# Patient Record
Sex: Male | Born: 1945 | Race: White | Hispanic: No | Marital: Married | Smoking: Former smoker
Health system: Southern US, Community
[De-identification: ages and names within clinical notes are randomized; demographics above are authoritative.]

## PROBLEM LIST (undated history)

## (undated) DIAGNOSIS — Z9109 Other allergy status, other than to drugs and biological substances: Secondary | ICD-10-CM

## (undated) DIAGNOSIS — K5900 Constipation, unspecified: Secondary | ICD-10-CM

## (undated) DIAGNOSIS — I739 Peripheral vascular disease, unspecified: Secondary | ICD-10-CM

## (undated) DIAGNOSIS — I10 Essential (primary) hypertension: Secondary | ICD-10-CM

## (undated) DIAGNOSIS — G20 Parkinson's disease: Secondary | ICD-10-CM

## (undated) DIAGNOSIS — K4090 Unilateral inguinal hernia, without obstruction or gangrene, not specified as recurrent: Secondary | ICD-10-CM

## (undated) DIAGNOSIS — E611 Iron deficiency: Secondary | ICD-10-CM

## (undated) DIAGNOSIS — E119 Type 2 diabetes mellitus without complications: Secondary | ICD-10-CM

## (undated) DIAGNOSIS — E785 Hyperlipidemia, unspecified: Secondary | ICD-10-CM

## (undated) DIAGNOSIS — G20A1 Parkinson's disease without dyskinesia, without mention of fluctuations: Secondary | ICD-10-CM

## (undated) DIAGNOSIS — M1990 Unspecified osteoarthritis, unspecified site: Secondary | ICD-10-CM

## (undated) DIAGNOSIS — I2510 Atherosclerotic heart disease of native coronary artery without angina pectoris: Secondary | ICD-10-CM

## (undated) DIAGNOSIS — R351 Nocturia: Secondary | ICD-10-CM

## (undated) HISTORY — DX: Hyperlipidemia, unspecified: E78.5

## (undated) HISTORY — DX: Type 2 diabetes mellitus without complications: E11.9

## (undated) HISTORY — PX: BACK SURGERY: SHX140

## (undated) HISTORY — DX: Peripheral vascular disease, unspecified: I73.9

## (undated) HISTORY — DX: Essential (primary) hypertension: I10

---

## 2003-12-29 ENCOUNTER — Ambulatory Visit (HOSPITAL_COMMUNITY): Admission: AD | Admit: 2003-12-29 | Discharge: 2003-12-31 | Payer: Self-pay | Admitting: Neurosurgery

## 2005-04-12 ENCOUNTER — Ambulatory Visit (HOSPITAL_COMMUNITY): Admission: RE | Admit: 2005-04-12 | Discharge: 2005-04-12 | Payer: Self-pay | Admitting: *Deleted

## 2006-05-29 ENCOUNTER — Ambulatory Visit (HOSPITAL_COMMUNITY): Admission: RE | Admit: 2006-05-29 | Discharge: 2006-05-29 | Payer: Self-pay | Admitting: Cardiology

## 2006-05-29 HISTORY — PX: CARDIAC CATHETERIZATION: SHX172

## 2006-08-02 ENCOUNTER — Ambulatory Visit (HOSPITAL_COMMUNITY): Admission: RE | Admit: 2006-08-02 | Discharge: 2006-08-02 | Payer: Self-pay | Admitting: Internal Medicine

## 2007-12-25 ENCOUNTER — Ambulatory Visit: Payer: Self-pay | Admitting: Vascular Surgery

## 2009-01-24 ENCOUNTER — Ambulatory Visit: Payer: Self-pay | Admitting: Vascular Surgery

## 2009-11-10 ENCOUNTER — Ambulatory Visit: Payer: Self-pay | Admitting: Vascular Surgery

## 2010-03-30 ENCOUNTER — Ambulatory Visit: Payer: Self-pay | Admitting: Vascular Surgery

## 2010-08-14 NOTE — Procedures (Signed)
DUPLEX DEEP VENOUS EXAM - LOWER EXTREMITY   INDICATION:  Right leg pain and swelling.   HISTORY:  Edema:  Right.  Trauma/Surgery:  No.  Pain:  Right.  PE:  No.  Previous DVT:  No.  Anticoagulants:  Aspirin.  Other:   DUPLEX EXAM:                CFV   SFV   PopV  PTV    GSV                R  L  R  L  R  L  R   L  R  L  Thrombosis    0  0  0     0     0      0  Spontaneous   +  +  +     +     +      +  Phasic        +  +  +     +     +      +  Augmentation  +  +  +     +     +      +  Compressible  +  +  +     +     +      +  Competent     +  +  +     +     +      +   Legend:  + - yes  o - no  p - partial  D - decreased   IMPRESSION:  1. The right leg and left SFJ (saphenofemoral junction) and common      femoral vein appear to be negative for deep venous thrombosis.  2. Preliminary called to Dr. Carolee Rota office, spoke with Harriett Sine.    _____________________________  Larina Earthly, M.D.   NT/MEDQ  D:  11/10/2009  T:  11/10/2009  Job:  [id number]

## 2010-08-17 NOTE — Op Note (Signed)
NAME:  Rodney Love, Rodney Love NO.:  [id number]   MEDICAL RECORD NO.:  [id number]          PATIENT TYPE:  AMB   LOCATION:  ENDO                         FACILITY:  [facility]   PHYSICIAN:  Georgiana Spinner, M.D.    DATE OF BIRTH:  January 22, 1946   DATE OF PROCEDURE:  DATE OF DISCHARGE:                                 OPERATIVE REPORT   PROCEDURE:  Colonoscopy.   INDICATIONS:  Rectal bleeding.   ANESTHESIA:  Demerol 60, Versed 7 mg.   PROCEDURE:  With the patient mildly sedated in the left lateral decubitus  position, the Olympus videoscopic colonoscope was inserted into the rectum  after a normal rectal exam and passed under direct vision to the cecum,  identified by the ileocecal valve and the appendiceal orifice, both of which  were photographed.  From this point, the colonoscope was slowly withdrawn,  taking circumferential views of the colonic mucosa, stopping only in the  rectum, which appeared normal on direct and showed hemorrhoids on  retroflexed view.  The endoscope was straightened and withdrawn.  Patient's  vital signs and pulse oximetry remained stable.  Patient tolerated the  procedure well without apparent complications.   FINDINGS:  Internal hemorrhoids, otherwise unremarkable colonoscopic  examination to the cecum.   PLAN:  Repeat examination possibly in five years.           ______________________________  Georgiana Spinner, M.D.     GMO/MEDQ  D:  04/12/2005  T:  04/12/2005  Job:  [id number]

## 2010-08-17 NOTE — Cardiovascular Report (Signed)
NAME:  Rodney Love, Rodney Love NO.:  [id number]   MEDICAL RECORD NO.:  [id number]          PATIENT TYPE:  OIB   LOCATION:  2899                         FACILITY:  [facility]   PHYSICIAN:  Antionette Char, MD    DATE OF BIRTH:  [birth year]   DATE OF PROCEDURE:  05/29/2006  DATE OF DISCHARGE:                            CARDIAC CATHETERIZATION   PROCEDURES.:  1. Left heart catheterization.  2. Coronary cineangiography.  3. Left ventricular cineangiography.  4. Abdominal aortogram with bilateral femoral runoff.  5. Angio-Seal of the right and left femoral arteries.   INDICATION FOR PROCEDURES:  This 65 year old male has a history of  peripheral vascular disease and is at high risk for cardiovascular  disease with a family history of ischemic heart disease, hypertension  and hypercholesterolemia. He had a significant change on his resting  electrocardiogram when he had a routine follow-up with Dr. Renne Crigler. He was  then sent for a treadmill exercise tolerance test which was positive and  showed moderate to high risk by [facility] criteria.  He was then scheduled  for a cardiac catheterization.   PROCEDURE:  After signing an informed consent the patient was  premedicated with 5 mg of Valium by mouth and brought to the cardiac  catheterization lab at [facility]  His right groin was prepped  and draped in sterile fashion and anesthetized locally with 1%  lidocaine.  A 6-French introducer sheath was inserted percutaneously  into the right femoral artery.  A good pressure was obtained in the  right femoral artery.  However, we were unable to pass the Seldinger  wire through the common iliac artery. We then attempted to cross this  segment with a Wholey Hi-Torque J-wire and again we were unable to pass  through this section.  His left groin was then prepped and draped in a  sterile fashion and anesthetized local with 1% lidocaine. 6-French  introducer sheath was inserted  percutaneously into the left femoral  artery. This time a sterile routine Seldinger wire was easily passed  through the femoral and iliac arteries and easily passed to the  ascending aorta. Injections were made into both coronary arteries using  6-French #4 Judkins coronary catheters.  A 6-French pigtail catheter was  used to measure pressures in the left ventricle and aorta and to make  midstream injections into the left ventricle, mid abdominal aorta and  distal abdominal aorta. The patient tolerated the procedure well and no  complications were noted at the end of the procedure. The catheter and  sheaths were removed from the left femoral artery and hemostasis was  easily obtained with an Angio-Seal closure system. The sheath was then  removed from the right femoral artery and hemostasis was easily obtained  with an Angio-Seal closure system.   MEDICATIONS GIVEN:  None.   HEMODYNAMIC DATA:  There was no gradient across the aortic valve.  Also  there was no gradient across the iliac artery stenotic area.  Left  ventricular ejection fraction 70%.   CORONARY CINEANGIOGRAPHY:  1. The left coronary artery. The ostium of the left  main appear      normal.  2. Left anterior descending coronary artery.  LAD has a mild narrowing      in its proximal segment which is approximately 20-30% in one view.      There is normal antegrade flow and normal distal runoff.  The      septal and anterolateral branches are normal with normal flow.  3. Circumflex coronary artery appears normal with normal flow.  4. Right coronary artery.  The ostium appears normal.  There is a      segmental plaque from the proximal to mid segment which causes a      segmental narrowing of 20-30% with a focal lesion in the range of      40%. There is very good antegrade flow and normal distal runoff.      The right coronary artery is a large dominant vessel supplying the      posterior descending and posterolateral  circulation to the left      ventricle.  The distal vessel from the acute angle and beyond all      appears normal.   LEFT VENTRICULAR CINEANGIOGRAM:  Left ventricular chamber size,  contractility and wall thickness appear normal. There are no abnormally  moving segments and ejection fraction was estimated at 70%.  The mitral  and aortic valves appear normal.   ABDOMINAL AORTOGRAM:  The abdominal aorta has mild irregularity with  mild eccentric plaque in the middle segment.  There is normal antegrade  flow. The renal arteries are normal in appearance and have normal flow.   ILIAC AND FEMORAL ARTERY RUNOFF:  The left iliac artery has a focal  irregular plaque in its middle segment just distal to the takeoff of the  internal iliac artery.  This is a 50-60% stenotic lesion. In the right  iliac artery in the same area was an indistinct lesion which appears  worse from the distal injection but proximally there does not appear to  be severe stenosis and there is normal antegrade flow and normal distal  runoff.   FINAL DIAGNOSIS:  1. Single-vessel coronary artery disease with moderate stenosis of the      proximal right coronary artery, approximately 40%.  2. Mild stenosis proximal LAD 20-30%.  3. Normal left ventricular function and ejection fraction 70%.  4. Minor irregularity in the mid abdominal aorta with normal renal      arteries.  5. 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  6. Unable to cross the right iliac artery lesion but has normal flow      and no gradient.  7. Successful Angio-Seal of both femoral arteries.   DISPOSITION:  We will continue to treat his right coronary artery  disease medically.  At present he is asymptomatic and we will continue  to treat his high cholesterol and hypertension. He was a previous smoker  and has recently stopped. We will refer him back to [facility] who follows him regularly for his peripheral artery disease. We will follow him up in  our office  to address his other problems.      Antionette Char, MD  Electronically Signed     JRT/MEDQ  D:  05/29/2006  T:  05/29/2006  Job:  [id number]

## 2010-08-17 NOTE — Cardiovascular Report (Signed)
NAME:  Rodney Love, Rodney Love NO.:  [id number]   MEDICAL RECORD NO.:  [id number]          PATIENT TYPE:  OIB   LOCATION:  2899                         FACILITY:  [facility]   PHYSICIAN:  Antionette Char, MD    DATE OF BIRTH:  May 14, 1945   DATE OF PROCEDURE:  DATE OF DISCHARGE:  05/29/2006                            CARDIAC CATHETERIZATION   No dictation for this job      Antionette Char, MD  Electronically Signed     JRT/MEDQ  D:  05/29/2006  T:  05/29/2006  Job:  [id number]

## 2010-08-17 NOTE — Op Note (Signed)
NAME:  Rodney Love, ROBISON NO.:  [id number]   MEDICAL RECORD NO.:  [id number]          PATIENT TYPE:  OIB   LOCATION:  3172                         FACILITY:  [facility]   PHYSICIAN:  Cristi Loron, M.D.DATE OF BIRTH:  [birth year]   DATE OF PROCEDURE:  12/29/2003  DATE OF DISCHARGE:                                 OPERATIVE REPORT   BRIEF HISTORY:  The patient is a 65 year old white male who suffers from  back and bilateral right greater than left leg pain.  He failed medical  management and was worked up with a lumbar MRI, which demonstrated the  patient had a herniated disk at L3-4 on the right, as well as spinal  stenosis at L4-L5.  I discussed the various treatment options with the  patient including surgery.  The patient has weighed the risks and  alternatives to surgery, decided to proceed with a lumbar decompression.   PREOPERATIVE DIAGNOSIS:  Right L3-L4 herniated ___________, L3-L4 and L4-L5  spinal stenosis, lumbago, and lumbar radiculopathy.   POSTOPERATIVE DIAGNOSIS:  Right L3-L4 herniated ____________, L3-L4 and L4-  L5 spinal stenosis, lumbago, and lumbar radiculopathy.   OPERATION:  L5 bilateral laminectomies and foraminotomies, L3 bilateral  laminotomy foraminotomy, right L3-L4 microdiskectomy, microdissection.   SURGEON:  Cristi Loron, M.D.   ASSISTANT:  Coletta Memos, M.D.   ANESTHESIA:  General endotracheal anesthesia.   ESTIMATED BLOOD LOSS:  50 mL.   SPECIMENS:  None.   DRAINS:  None.   COMPLICATIONS:  None.   DESCRIPTION OF PROCEDURE:  The patient was brought to the operating room by  the anesthesia team.  General endotracheal anesthesia was induced.  The  patient was turned to the prone position on a Wilson frame.  His lumbosacral  region was then prepared with Betadine scrub with Betadine solution and  sterile drapes were applied.  I then injected the area to be incised with  Marcaine with epinephrine solution using a scalpel  to make a linear midline  incision over the L3-L4 and L4-L5 areas.  I used electrocautery to dissect  down to the thoracolumbar fascia, divided the fascia bilaterally and  performed a bilateral subperiosteal dissection, exposing the spinous process  and lamina of L3, L4 and L5.  We then obtained intraoperative radiograph to  confirm our location.  We then inserted the [facility] retractor for our  exposure, and then brought the operating microscope into the field.  Under  magnification and illumination, I completed the  microdissection/decompression.  I used the high-speed drill to perform  bilateral L3 and L4 laminotomies.  I incised the L3-L4 and L4-L5  interspinous ligament with a scalpel, and then used Leksell rongeurs to  remove the spinous process of L4 and part of the L4 lamina.  We then  completed the L4 laminectomy using Kerrison punch and then performed a  generous foraminotomy above the bilateral L5 nerve roots, we then removed.  We used microdissection to free up the thecal sac from the epidural tissue,  as well as the ligamentum flavum.  We removed excess ligamentum flavum from  lateral recesses, further  decompressing the lateral recess, we also  performed a foraminotomy about bilateral L4 nerve roots.  We then widened  the laminotomies bilaterally at L3, removed the L3-L4 ligamentum flavum as  well.  We then freed up the right thecal sac and right L4 nerve root from  the epidural tissue.  Dr. Franky Macho transiently retracted the thecal sac and  L4 root medially with the __________ retractor.  This exposed the underlying  herniated disk at L3-L4 on the right, by incising this herniated disk and  removed with pituitary forceps.  We found that the disk had migrated just  caudal to the right L3-L4 interspace and was compressing the L4 nerve root  just as it entered into the neural foramen.  We used micro dissection to  free up the disk herniation, removed it in multiple fragments  using the  pituitary forceps.  We inspected the intervertebral disk at L3-L4.  There  was no impending herniation and there was no compression of the thecal sac,  so we therefore did not enter into the L3-L4 intervertebral disk space.  We  then palpated along the ventral surface of the thecal sac, bilaterally at L3-  L4 and L4-L5 and noted that there was no significant compression from the  intervertebral disk.  We palpated along the bilateral L4 and L5 nerve roots,  and noted they were well decompressed.  We then obtained stringent  hemostasis using bipolar electrocautery.  We copiously irrigated the wound  out with Bacitracin and removed this solution and then removed the  McCullough retractor.  We then reapproximated the patient's thoracolumbar  fascia with interrupted #1 Vicryl suture.  The subcutaneous tissue was  closed using interrupted 2-0 Vicryl suture, and the skin with Steri-Strips  and benzoin.  The wound was then coated with Bacitracin ointment.  Sterile  dressings were applied.  The drapes were removed.  The patient was  subsequently returned to the supine position, where he was extubated by the  anesthesia team and transported to the post anesthesia care unit in stable  condition.  All sponge, instrument, and needle counts were correct at the  end of the case.       JDJ/MEDQ  D:  12/29/2003  T:  12/30/2003  Job:  [id number]

## 2012-08-05 ENCOUNTER — Ambulatory Visit (INDEPENDENT_AMBULATORY_CARE_PROVIDER_SITE_OTHER): Payer: Medicare Other | Admitting: General Surgery

## 2012-08-05 ENCOUNTER — Encounter (INDEPENDENT_AMBULATORY_CARE_PROVIDER_SITE_OTHER): Payer: Self-pay | Admitting: General Surgery

## 2012-08-05 VITALS — BP 132/80 | HR 71 | Temp 97.0°F | Resp 16 | Ht 73.0 in | Wt 182.8 lb

## 2012-08-05 DIAGNOSIS — K429 Umbilical hernia without obstruction or gangrene: Secondary | ICD-10-CM

## 2012-08-05 DIAGNOSIS — K4020 Bilateral inguinal hernia, without obstruction or gangrene, not specified as recurrent: Secondary | ICD-10-CM

## 2012-08-05 NOTE — Progress Notes (Signed)
Patient ID: Rodney Love, male   DOB: May 07, 1945, 67 y.o.   MRN: [id number]  No chief complaint on file.   HPI Rodney Love is a 67 y.o. male.  Referred by Dr. Renne Crigler for evaluation of RIH.  This was noticed on physical exam by his primary doctor.  And he said that he noticed a bulge in his right groin and brought this up to his doctor in an inguinal hernia was confirmed. He says that this bulge is periodically when he stands for prolonged periods but doesn't really cause any discomfort. He denies any left inguinal bulge. He denies any obstructive symptoms. He says his current last colonoscopy. There is no exacerbating or relieving factors. HPI  Past Medical History  Diagnosis Date  . Diabetes mellitus without complication   . Hypertension   . Hyperlipidemia     Past Surgical History  Procedure Laterality Date  . Back surgery      No family history on file.  Social History History  Substance Use Topics  . Smoking status: Former Games developer  . Smokeless tobacco: Former Neurosurgeon    Quit date: 08/06/1991  . Alcohol Use: No    Allergies no known allergies  Current Outpatient Prescriptions  Medication Sig Dispense Refill  . amLODipine (NORVASC) 2.5 MG tablet Take 2.5 mg by mouth daily.      Marland Kitchen aspirin 325 MG tablet Take 325 mg by mouth daily.      . benazepril-hydrochlorthiazide (LOTENSIN HCT) 20-25 MG per tablet       . fish oil-omega-3 fatty acids 1000 MG capsule Take 2 g by mouth daily.      . metFORMIN (GLUCOPHAGE) 1000 MG tablet Take 1,000 mg by mouth 2 (two) times daily with a meal.      . Multiple Vitamin (MULTIVITAMIN) tablet Take 1 tablet by mouth daily.      . ONE TOUCH ULTRA TEST test strip       . ONETOUCH DELICA LANCETS 33G MISC       . rosuvastatin (CRESTOR) 5 MG tablet Take 5 mg by mouth daily.       No current facility-administered medications for this visit.    Review of Systems Review of Systems All other review of systems negative or noncontributory except as  stated in the HPI  Blood pressure 132/80, pulse 71, temperature 97 F (36.1 C), temperature source Temporal, resp. rate 16, height 6\' 1"  (1.854 m), weight 182 lb 12.8 oz (82.918 kg).  Physical Exam Physical Exam Physical Exam  Vitals reviewed. Constitutional: He is oriented to person, place, and time. He appears well-developed and well-nourished. No distress.  HENT:  Head: Normocephalic and atraumatic.  Mouth/Throat: No oropharyngeal exudate.  Eyes: Conjunctivae and EOM are normal. Pupils are equal, round, and reactive to light. Right eye exhibits no discharge. Left eye exhibits no discharge. No scleral icterus.  Neck: Normal range of motion. No tracheal deviation present.  Cardiovascular: Normal rate, regular rhythm and normal heart sounds.   Pulmonary/Chest: Effort normal and breath sounds normal. No stridor. No respiratory distress. He has no wheezes. He has no rales. He exhibits no tenderness.  Abdominal: Soft. Bowel sounds are normal. He exhibits no distension and no mass. There is no tenderness. There is no rebound and no guarding. he has small reducible bilateral inguinal hernias on exam as well as a tiny reducible umbilical hernia Musculoskeletal: Normal range of motion. He exhibits no edema and no tenderness.  Neurological: He is alert and oriented to person,  place, and time.  Skin: Skin is warm and dry. No rash noted. He is not diaphoretic. No erythema. No pallor.  Psychiatric: He has a normal mood and affect. His behavior is normal. Judgment and thought content normal.    Data Reviewed   Assessment    Bilateral inguinal hernias-reducible Umbilical hernia-reducible He does have bilateral small asymptomatic and reducible inguinal hernias as well as a small reducible umbilical hernia on exam. We discussed the options of watchful waiting versus surgical repair as well as the option of wearing a truss.  I discussed the possibility of laparoscopic repair for simultaneous repair of  all 3 defects as an outpatient.  We discussed the pros and cons and since he is asymptomatic currently, he would like to continue with watchful waiting. A discussed with him the risk of possible incarceration and strangulation and the signs and symptoms to watch out for this. Recommended that he return to the emergency room if any signs of incarceration.     Plan    He will continue with watchful waiting I gave him my contact information to coming back if he develops any signs and symptoms or if he would like to schedule repair of his hernias.        Lodema Pilot DAVID 08/05/2012, 9:02 AM

## 2012-08-11 ENCOUNTER — Telehealth (INDEPENDENT_AMBULATORY_CARE_PROVIDER_SITE_OTHER): Payer: Self-pay | Admitting: *Deleted

## 2012-08-11 NOTE — Telephone Encounter (Signed)
Patient's daughter called to let us know that patient is having blotting, abdominal pain, irregular bowel movements, gassy, and fatigue that comes and goes.  Daughter reports he has some good days but then he has some really bad days.  Daughter states that her father is very stoic and want let people know when he truly is having symptoms.  Daughter concerned regarding patients symptoms.

## 2012-08-12 ENCOUNTER — Telehealth (INDEPENDENT_AMBULATORY_CARE_PROVIDER_SITE_OTHER): Payer: Self-pay

## 2012-08-12 NOTE — Telephone Encounter (Signed)
Daughter has called again still concerned about these symptoms.

## 2012-08-12 NOTE — Telephone Encounter (Signed)
Called and spoke to patient regarding the message his daughter left.  Patient states he's not having abdominal pain, he reports having an increase of gas and states he get's constipated from time to time.  He wasn't sure if the hernia had anything to do with his constipation.  Offered patient an appointment for 08/13/12 @ 1:00 pm w/Dr. Biagio Quint and he states he'll call me later today or in the morning if he decides not to come in.  Patient ask what he could take over the counter for constipation, I advised patient to drink plenty of liquids, he can try miralax.  Patient is still undecided on having hernia repair surgery.  Patient advised to call our office or go to the nearest ER if he develops an increase in pain, nausea or vomiting

## 2012-08-12 NOTE — Telephone Encounter (Signed)
Return call to patient daughter Corrie Dandy.  Dr. Biagio Quint has an opening on his schedule for Thursday 08/13/12 @ 1:00 pm if patient would like to come in for evaluation of his abd pain and recheck hernia.

## 2012-08-13 ENCOUNTER — Ambulatory Visit (INDEPENDENT_AMBULATORY_CARE_PROVIDER_SITE_OTHER): Payer: Medicare Other | Admitting: General Surgery

## 2012-08-13 ENCOUNTER — Encounter (INDEPENDENT_AMBULATORY_CARE_PROVIDER_SITE_OTHER): Payer: Self-pay | Admitting: General Surgery

## 2012-08-13 VITALS — BP 130/80 | HR 78 | Temp 97.0°F | Resp 16 | Wt 181.8 lb

## 2012-08-13 DIAGNOSIS — K429 Umbilical hernia without obstruction or gangrene: Secondary | ICD-10-CM

## 2012-08-13 DIAGNOSIS — K4020 Bilateral inguinal hernia, without obstruction or gangrene, not specified as recurrent: Secondary | ICD-10-CM

## 2012-08-13 NOTE — Progress Notes (Signed)
Subjective:     Patient ID: Rodney Love, male   DOB: 24-May-1945, 67 y.o.   MRN: [id number]  HPI This patient returns today to discuss surgical options for repair of his hernias. I saw him last week and he was noted to have reducible bilateral inguinal hernias and a small umbilical hernia and he said that he was relatively asymptomatic at that time. Since then he has noted some continued bulging in the area of his right groin and has had some constipation that really continues to be relatively asymptomatic. However, he decided that he is interested in having surgery.  Review of Systems     Objective:   Physical Exam On exam he has a easily reducible right inguinal hernia which is moderate sized and a small reducible left inguinal hernia and he has a small reducible umbilical hernia as well    Assessment:     Reducible bilateral inguinal hernias and umbilical hernia I again offered either open or laparoscopic repair for this patient and we discussed the pros and cons of each as well as the option of continued watchful waiting period he would like to proceed with surgery and I think that laparoscopic bilateral hernia repair with umbilical hernia  Would be the best for him. I discussed with him the procedure and the risks including infection, bleeding, pain, scarring, recurrence, chronic pain, injury to the vas deferens or testicle and he would like to proceed with planned procedure.     Plan:     We will go ahead and set him up for microscopic bilateral inguinal hernia repair with mesh and umbilical hernia repair

## 2012-09-11 ENCOUNTER — Encounter (HOSPITAL_COMMUNITY): Payer: Self-pay | Admitting: Pharmacy Technician

## 2012-09-18 ENCOUNTER — Ambulatory Visit (HOSPITAL_COMMUNITY)
Admission: RE | Admit: 2012-09-18 | Discharge: 2012-09-18 | Disposition: A | Discharge status: Home / Self Care | Payer: Medicare Other | Source: Ambulatory Visit | Attending: General Surgery | Admitting: General Surgery

## 2012-09-18 ENCOUNTER — Encounter (HOSPITAL_COMMUNITY)
Admission: RE | Admit: 2012-09-18 | Discharge: 2012-09-18 | Disposition: A | Discharge status: Home / Self Care | Payer: Medicare Other | Source: Ambulatory Visit | Attending: General Surgery | Admitting: General Surgery

## 2012-09-18 ENCOUNTER — Other Ambulatory Visit (HOSPITAL_COMMUNITY): Payer: Self-pay | Admitting: *Deleted

## 2012-09-18 ENCOUNTER — Encounter (HOSPITAL_COMMUNITY): Payer: Self-pay

## 2012-09-18 VITALS — BP 134/72 | HR 62 | Temp 97.6°F | Resp 16 | Ht 73.0 in | Wt 181.1 lb

## 2012-09-18 DIAGNOSIS — I10 Essential (primary) hypertension: Secondary | ICD-10-CM | POA: Insufficient documentation

## 2012-09-18 DIAGNOSIS — K429 Umbilical hernia without obstruction or gangrene: Secondary | ICD-10-CM

## 2012-09-18 DIAGNOSIS — K4020 Bilateral inguinal hernia, without obstruction or gangrene, not specified as recurrent: Secondary | ICD-10-CM

## 2012-09-18 DIAGNOSIS — Z01818 Encounter for other preprocedural examination: Secondary | ICD-10-CM | POA: Insufficient documentation

## 2012-09-18 DIAGNOSIS — E119 Type 2 diabetes mellitus without complications: Secondary | ICD-10-CM | POA: Insufficient documentation

## 2012-09-18 DIAGNOSIS — K4090 Unilateral inguinal hernia, without obstruction or gangrene, not specified as recurrent: Secondary | ICD-10-CM | POA: Insufficient documentation

## 2012-09-18 HISTORY — DX: Constipation, unspecified: K59.00

## 2012-09-18 HISTORY — DX: Nocturia: R35.1

## 2012-09-18 HISTORY — DX: Other allergy status, other than to drugs and biological substances: Z91.09

## 2012-09-18 HISTORY — DX: Unspecified osteoarthritis, unspecified site: M19.90

## 2012-09-18 HISTORY — DX: Unilateral inguinal hernia, without obstruction or gangrene, not specified as recurrent: K40.90

## 2012-09-18 HISTORY — DX: Atherosclerotic heart disease of native coronary artery without angina pectoris: I25.10

## 2012-09-18 LAB — CBC
HCT: 42.1 % (ref 39.0–52.0)
Hemoglobin: 14.7 g/dL (ref 13.0–17.0)
MCH: 31.8 pg (ref 26.0–34.0)
MCHC: 34.9 g/dL (ref 30.0–36.0)
MCV: 91.1 fL (ref 78.0–100.0)
Platelets: 169 10*3/uL (ref 150–400)
RBC: 4.62 MIL/uL (ref 4.22–5.81)
RDW: 13.1 % (ref 11.5–15.5)
WBC: 5.8 10*3/uL (ref 4.0–10.5)

## 2012-09-18 LAB — BASIC METABOLIC PANEL
BUN: 11 mg/dL (ref 6–23)
CO2: 26 mEq/L (ref 19–32)
Calcium: 9.8 mg/dL (ref 8.4–10.5)
Chloride: 103 mEq/L (ref 96–112)
Creatinine, Ser: 0.94 mg/dL (ref 0.50–1.35)
GFR calc Af Amer: >90 mL/min (ref >90)
GFR calc non Af Amer: 84 mL/min — ABNORMAL LOW (ref >90)
Glucose, Bld: 114 mg/dL — ABNORMAL HIGH (ref 70–99)
Potassium: 4.4 mEq/L (ref 3.5–5.1)
Sodium: 138 mEq/L (ref 135–145)

## 2012-09-18 LAB — SURGICAL PCR SCREEN
MRSA, PCR: NEGATIVE
Staphylococcus aureus: NEGATIVE

## 2012-09-18 NOTE — Patient Instructions (Addendum)
Rodney Love  09/18/2012                           YOUR PROCEDURE IS SCHEDULED ON: 09/23/12               PLEASE REPORT TO SHORT STAY CENTER AT :  6:00 AM               CALL THIS NUMBER IF ANY PROBLEMS THE DAY OF SURGERY :               [phone]                      REMEMBER:   Do not eat food or drink liquids AFTER MIDNIGHT   Take these medicines the morning of surgery with A SIP OF WATER:  AMLODIPINE   Do not wear jewelry, make-up   Do not wear lotions, powders, or perfumes.   Do not shave legs or underarms 12 hrs. before surgery (men may shave face)  Do not bring valuables to the hospital.  Contacts, dentures or bridgework may not be worn into surgery.  Leave suitcase in the car. After surgery it may be brought to your room.  For patients admitted to the hospital more than one night, checkout time is 11:00                          The day of discharge.   Patients discharged the day of surgery will not be allowed to drive home                             If going home same day of surgery, must have someone stay with you first                           24 hrs at home and arrange for some one to drive you home from hospital.    Special Instructions:   Please read over the following fact sheets that you were given:               1. MRSA  INFORMATION                      2. [place] PREPARING FOR SURGERY SHEET                                                X_____________________________________________________________________        Failure to follow these instructions may result in cancellation of your surgery

## 2012-09-18 NOTE — Progress Notes (Signed)
09/18/12 1132  OBSTRUCTIVE SLEEP APNEA  Have you ever been diagnosed with sleep apnea through a sleep study? No  Do you snore loudly (loud enough to be heard through closed doors)?  1  Do you often feel tired, fatigued, or sleepy during the daytime? 0  Has anyone observed you stop breathing during your sleep? 1  Do you have, or are you being treated for high blood pressure? 1  BMI more than 35 kg/m2? 0  Age over 69 years old? 1  Neck circumference greater than 40 cm/18 inches? 0  Gender: 1  Obstructive Sleep Apnea Score 5  Score 4 or greater  Results sent to PCP

## 2012-09-23 ENCOUNTER — Encounter (HOSPITAL_COMMUNITY): Payer: Self-pay | Admitting: *Deleted

## 2012-09-23 ENCOUNTER — Encounter (HOSPITAL_COMMUNITY): Payer: Self-pay | Admitting: Certified Registered Nurse Anesthetist

## 2012-09-23 ENCOUNTER — Encounter (HOSPITAL_COMMUNITY)
Admission: RE | Disposition: A | Discharge status: Home / Self Care | Payer: Self-pay | Source: Ambulatory Visit | Attending: General Surgery

## 2012-09-23 ENCOUNTER — Ambulatory Visit (HOSPITAL_COMMUNITY)
Admission: RE | Admit: 2012-09-23 | Discharge: 2012-09-24 | Disposition: A | Discharge status: Home / Self Care | Payer: Medicare Other | Source: Ambulatory Visit | Attending: General Surgery | Admitting: General Surgery

## 2012-09-23 ENCOUNTER — Ambulatory Visit (HOSPITAL_COMMUNITY): Payer: Medicare Other | Admitting: Certified Registered Nurse Anesthetist

## 2012-09-23 DIAGNOSIS — K4020 Bilateral inguinal hernia, without obstruction or gangrene, not specified as recurrent: Secondary | ICD-10-CM

## 2012-09-23 DIAGNOSIS — I10 Essential (primary) hypertension: Secondary | ICD-10-CM | POA: Insufficient documentation

## 2012-09-23 DIAGNOSIS — K5900 Constipation, unspecified: Secondary | ICD-10-CM | POA: Insufficient documentation

## 2012-09-23 DIAGNOSIS — E119 Type 2 diabetes mellitus without complications: Secondary | ICD-10-CM | POA: Insufficient documentation

## 2012-09-23 DIAGNOSIS — K429 Umbilical hernia without obstruction or gangrene: Secondary | ICD-10-CM

## 2012-09-23 DIAGNOSIS — R339 Retention of urine, unspecified: Secondary | ICD-10-CM | POA: Insufficient documentation

## 2012-09-23 DIAGNOSIS — E785 Hyperlipidemia, unspecified: Secondary | ICD-10-CM | POA: Insufficient documentation

## 2012-09-23 DIAGNOSIS — Z79899 Other long term (current) drug therapy: Secondary | ICD-10-CM | POA: Insufficient documentation

## 2012-09-23 HISTORY — PX: INSERTION OF MESH: SHX5868

## 2012-09-23 HISTORY — PX: INGUINAL HERNIA REPAIR: SHX194

## 2012-09-23 HISTORY — PX: HERNIA REPAIR: SHX51

## 2012-09-23 LAB — GLUCOSE, CAPILLARY
Glucose-Capillary: 116 mg/dL — ABNORMAL HIGH (ref 70–99)
Glucose-Capillary: 163 mg/dL — ABNORMAL HIGH (ref 70–99)

## 2012-09-23 SURGERY — REPAIR, HERNIA, INGUINAL, LAPAROSCOPIC
Anesthesia: General | Site: Groin | Wound class: Clean

## 2012-09-23 MED ORDER — ROCURONIUM BROMIDE 100 MG/10ML IV SOLN
INTRAVENOUS | Status: DC | PRN
  Administered 2012-09-23: 10 mg via INTRAVENOUS
  Administered 2012-09-23: 40 mg via INTRAVENOUS
  Administered 2012-09-23: 10 mg via INTRAVENOUS

## 2012-09-23 MED ORDER — HYDROMORPHONE HCL PF 1 MG/ML IJ SOLN
INTRAMUSCULAR | Status: DC | PRN
  Administered 2012-09-23 (×4): 0.5 mg via INTRAVENOUS

## 2012-09-23 MED ORDER — BUPIVACAINE HCL (PF) 0.25 % IJ SOLN
INTRAMUSCULAR | Status: AC
  Filled 2012-09-23: qty 30

## 2012-09-23 MED ORDER — KETAMINE HCL 10 MG/ML IJ SOLN
INTRAMUSCULAR | Status: DC | PRN
  Administered 2012-09-23: 20 mg via INTRAVENOUS

## 2012-09-23 MED ORDER — METFORMIN HCL 500 MG PO TABS
1000.0000 mg | ORAL_TABLET | Freq: Two times a day (BID) | ORAL | Status: DC
Start: 2012-09-23 — End: 2012-09-24
  Administered 2012-09-23 – 2012-09-24 (×2): 1000 mg via ORAL
  Filled 2012-09-23 (×4): qty 2

## 2012-09-23 MED ORDER — NEOSTIGMINE METHYLSULFATE 1 MG/ML IJ SOLN
INTRAMUSCULAR | Status: DC | PRN
  Administered 2012-09-23: 5 mg via INTRAVENOUS

## 2012-09-23 MED ORDER — SUCCINYLCHOLINE CHLORIDE 20 MG/ML IJ SOLN
INTRAMUSCULAR | Status: DC | PRN
  Administered 2012-09-23: 100 mg via INTRAVENOUS

## 2012-09-23 MED ORDER — PROPOFOL 10 MG/ML IV BOLUS
INTRAVENOUS | Status: DC | PRN
  Administered 2012-09-23: 150 mg via INTRAVENOUS

## 2012-09-23 MED ORDER — BENAZEPRIL-HYDROCHLOROTHIAZIDE 20-25 MG PO TABS: 0.5000 | ORAL_TABLET | Freq: Every morning | ORAL | Status: DC

## 2012-09-23 MED ORDER — LIDOCAINE-EPINEPHRINE 1 %-1:100000 IJ SOLN
INTRAMUSCULAR | Status: DC | PRN
  Administered 2012-09-23: 25 mL

## 2012-09-23 MED ORDER — ONDANSETRON HCL 4 MG/2ML IJ SOLN: 4.0000 mg | Freq: Four times a day (QID) | INTRAMUSCULAR | Status: DC | PRN

## 2012-09-23 MED ORDER — GLYCOPYRROLATE 0.2 MG/ML IJ SOLN
INTRAMUSCULAR | Status: DC | PRN
  Administered 2012-09-23: 0.6 mg via INTRAVENOUS

## 2012-09-23 MED ORDER — HYDROCODONE-ACETAMINOPHEN 5-325 MG PO TABS
1.0000 | ORAL_TABLET | ORAL | Status: DC | PRN
  Administered 2012-09-23 – 2012-09-24 (×3): 1 via ORAL
  Filled 2012-09-23 (×3): qty 1

## 2012-09-23 MED ORDER — MIDAZOLAM HCL 5 MG/5ML IJ SOLN
INTRAMUSCULAR | Status: DC | PRN
  Administered 2012-09-23: 2 mg via INTRAVENOUS

## 2012-09-23 MED ORDER — LACTATED RINGERS IV SOLN
INTRAVENOUS | Status: DC | PRN
  Administered 2012-09-23: 08:00:00 via INTRAVENOUS

## 2012-09-23 MED ORDER — HYDROCHLOROTHIAZIDE 12.5 MG PO CAPS
12.5000 mg | ORAL_CAPSULE | Freq: Every day | ORAL | Status: DC
  Filled 2012-09-23: qty 1

## 2012-09-23 MED ORDER — FENTANYL CITRATE 0.05 MG/ML IJ SOLN
INTRAMUSCULAR | Status: DC | PRN
  Administered 2012-09-23: 100 ug via INTRAVENOUS
  Administered 2012-09-23 (×3): 50 ug via INTRAVENOUS

## 2012-09-23 MED ORDER — TISSEEL VH 10 ML EX KIT
PACK | CUTANEOUS | Status: DC | PRN
  Administered 2012-09-23: 10 mL

## 2012-09-23 MED ORDER — ENOXAPARIN SODIUM 40 MG/0.4ML ~~LOC~~ SOLN
40.0000 mg | SUBCUTANEOUS | Status: DC
  Filled 2012-09-23: qty 0.4

## 2012-09-23 MED ORDER — ONDANSETRON HCL 4 MG PO TABS
4.0000 mg | ORAL_TABLET | Freq: Four times a day (QID) | ORAL | Status: DC | PRN
  Filled 2012-09-23: qty 1

## 2012-09-23 MED ORDER — BENAZEPRIL HCL 10 MG PO TABS
10.0000 mg | ORAL_TABLET | Freq: Every day | ORAL | Status: DC
  Filled 2012-09-23: qty 1

## 2012-09-23 MED ORDER — ONDANSETRON HCL 4 MG/2ML IJ SOLN
INTRAMUSCULAR | Status: DC | PRN
  Administered 2012-09-23: 4 mg via INTRAVENOUS

## 2012-09-23 MED ORDER — CEFAZOLIN SODIUM-DEXTROSE 2-3 GM-% IV SOLR
2.0000 g | INTRAVENOUS | Status: AC
  Administered 2012-09-23: 2 g via INTRAVENOUS

## 2012-09-23 MED ORDER — TISSEEL VH 10 ML EX KIT
PACK | CUTANEOUS | Status: AC
  Filled 2012-09-23: qty 1

## 2012-09-23 MED ORDER — LIDOCAINE HCL 1 % IJ SOLN
INTRAMUSCULAR | Status: AC
  Filled 2012-09-23: qty 20

## 2012-09-23 MED ORDER — HYDROMORPHONE HCL PF 1 MG/ML IJ SOLN: 0.2500 mg | INTRAMUSCULAR | Status: DC | PRN

## 2012-09-23 MED ORDER — AMLODIPINE BESYLATE 2.5 MG PO TABS
2.5000 mg | ORAL_TABLET | Freq: Every morning | ORAL | Status: DC
  Filled 2012-09-23: qty 1

## 2012-09-23 MED ORDER — DEXAMETHASONE SODIUM PHOSPHATE 10 MG/ML IJ SOLN
INTRAMUSCULAR | Status: DC | PRN
  Administered 2012-09-23: 10 mg via INTRAVENOUS

## 2012-09-23 MED ORDER — DOCUSATE SODIUM 100 MG PO CAPS: 100.0000 mg | ORAL_CAPSULE | Freq: Two times a day (BID) | ORAL | Status: DC

## 2012-09-23 MED ORDER — BUPIVACAINE-EPINEPHRINE 0.25% -1:200000 IJ SOLN
INTRAMUSCULAR | Status: AC
  Filled 2012-09-23: qty 1

## 2012-09-23 MED ORDER — PHENYLEPHRINE HCL 10 MG/ML IJ SOLN
INTRAMUSCULAR | Status: DC | PRN
  Administered 2012-09-23 (×2): 80 ug via INTRAVENOUS
  Administered 2012-09-23: 40 ug via INTRAVENOUS

## 2012-09-23 MED ORDER — LIDOCAINE HCL 4 % MT SOLN
OROMUCOSAL | Status: DC | PRN
  Administered 2012-09-23: 4 mL via TOPICAL

## 2012-09-23 MED ORDER — CEFAZOLIN SODIUM-DEXTROSE 2-3 GM-% IV SOLR
INTRAVENOUS | Status: AC
  Filled 2012-09-23: qty 50

## 2012-09-23 MED ORDER — LACTATED RINGERS IV SOLN
INTRAVENOUS | Status: DC
  Administered 2012-09-23: 17:00:00 via INTRAVENOUS

## 2012-09-23 MED ORDER — MORPHINE SULFATE 10 MG/ML IJ SOLN: 2.0000 mg | INTRAMUSCULAR | Status: DC | PRN

## 2012-09-23 MED ORDER — HYDROCODONE-ACETAMINOPHEN 5-325 MG PO TABS: 1.0000 | ORAL_TABLET | ORAL | Status: DC | PRN

## 2012-09-23 MED ORDER — LIDOCAINE-EPINEPHRINE 1 %-1:100000 IJ SOLN
INTRAMUSCULAR | Status: AC
  Filled 2012-09-23: qty 1

## 2012-09-23 MED ORDER — EPHEDRINE SULFATE 50 MG/ML IJ SOLN
INTRAMUSCULAR | Status: DC | PRN
  Administered 2012-09-23: 10 mg via INTRAVENOUS
  Administered 2012-09-23: 5 mg via INTRAVENOUS

## 2012-09-23 MED ORDER — BUPIVACAINE HCL (PF) 0.25 % IJ SOLN
INTRAMUSCULAR | Status: DC | PRN
  Administered 2012-09-23: 25 mL

## 2012-09-23 MED ORDER — LIDOCAINE HCL (CARDIAC) 20 MG/ML IV SOLN
INTRAVENOUS | Status: DC | PRN
  Administered 2012-09-23: 100 mg via INTRAVENOUS

## 2012-09-23 MED ORDER — PROMETHAZINE HCL 25 MG/ML IJ SOLN: 6.2500 mg | INTRAMUSCULAR | Status: DC | PRN

## 2012-09-23 SURGICAL SUPPLY — 46 items
ADH SKN CLS APL DERMABOND .7 (GAUZE/BANDAGES/DRESSINGS) ×2
APPLIER CLIP 5 13 M/L LIGAMAX5 (MISCELLANEOUS)
APR CLP MED LRG 5 ANG JAW (MISCELLANEOUS)
BLADE SURG SZ12 CARB STEEL (BLADE) ×3 IMPLANT
CABLE HIGH FREQUENCY MONO STRZ (ELECTRODE) IMPLANT
CANISTER SUCTION 2500CC (MISCELLANEOUS) ×3 IMPLANT
CHLORAPREP W/TINT 26ML (MISCELLANEOUS) ×4 IMPLANT
CLIP APPLIE 5 13 M/L LIGAMAX5 (MISCELLANEOUS) IMPLANT
CLOTH BEACON ORANGE TIMEOUT ST (SAFETY) ×3 IMPLANT
COTTON STERILE ROLL (GAUZE/BANDAGES/DRESSINGS) ×1 IMPLANT
DECANTER SPIKE VIAL GLASS SM (MISCELLANEOUS) ×3 IMPLANT
DERMABOND ADVANCED (GAUZE/BANDAGES/DRESSINGS) ×1
DERMABOND ADVANCED .7 DNX12 (GAUZE/BANDAGES/DRESSINGS) IMPLANT
DEVICE SECURE STRAP 25 ABSORB (INSTRUMENTS) ×1 IMPLANT
DISSECT BALLN SPACEMKR + OVL (BALLOONS) ×3
DISSECTOR BALLN SPACEMKR + OVL (BALLOONS) IMPLANT
DISSECTOR BLUNT TIP ENDO 5MM (MISCELLANEOUS) ×3 IMPLANT
DRAPE LAPAROSCOPIC ABDOMINAL (DRAPES) ×3 IMPLANT
DRAPE WARM FLUID 44X44 (DRAPE) ×3 IMPLANT
DRSG TEGADERM 4X4.75 (GAUZE/BANDAGES/DRESSINGS) ×1 IMPLANT
DUPLOJECT EASY PREP 4ML (MISCELLANEOUS) ×1 IMPLANT
ELECT REM PT RETURN 9FT ADLT (ELECTROSURGICAL) ×3
ELECTRODE REM PT RTRN 9FT ADLT (ELECTROSURGICAL) ×2 IMPLANT
GLOVE BIO SURGEON STRL SZ7 (GLOVE) ×3 IMPLANT
GLOVE BIOGEL PI IND STRL 7.0 (GLOVE) ×2 IMPLANT
GLOVE BIOGEL PI INDICATOR 7.0 (GLOVE) ×3
GLOVE SURG SS PI 7.5 STRL IVOR (GLOVE) ×7 IMPLANT
GOWN PREVENTION PLUS LG XLONG (DISPOSABLE) ×2 IMPLANT
GOWN STRL NON-REIN LRG LVL3 (GOWN DISPOSABLE) ×3 IMPLANT
GOWN STRL REIN XL XLG (GOWN DISPOSABLE) ×7 IMPLANT
KIT BASIN OR (CUSTOM PROCEDURE TRAY) ×3 IMPLANT
MARKER SKIN DUAL TIP RULER LAB (MISCELLANEOUS) ×3 IMPLANT
MESH ULTRAPRO 12X12 30X30CM (Mesh General) ×1 IMPLANT
NS IRRIG 1000ML POUR BTL (IV SOLUTION) ×3 IMPLANT
PENCIL BUTTON HOLSTER BLD 10FT (ELECTRODE) ×3 IMPLANT
SCISSORS LAP 5X35 DISP (ENDOMECHANICALS) IMPLANT
SET IRRIG TUBING LAPAROSCOPIC (IRRIGATION / IRRIGATOR) IMPLANT
STAPLER VISISTAT 35W (STAPLE) ×1 IMPLANT
STRIP CLOSURE SKIN 1/2X4 (GAUZE/BANDAGES/DRESSINGS) IMPLANT
SUT MNCRL AB 4-0 PS2 18 (SUTURE) ×3 IMPLANT
TACKER 5MM HERNIA 3.5CML NAB (ENDOMECHANICALS) ×3 IMPLANT
TOWEL OR 17X26 10 PK STRL BLUE (TOWEL DISPOSABLE) ×4 IMPLANT
TRAY FOLEY CATH 14FRSI W/METER (CATHETERS) ×3 IMPLANT
TRAY LAP CHOLE (CUSTOM PROCEDURE TRAY) ×3 IMPLANT
TROCAR BLADELESS OPT 5 75 (ENDOMECHANICALS) ×6 IMPLANT
TUBING INSUFFLATION 10FT LAP (TUBING) ×3 IMPLANT

## 2012-09-23 NOTE — Anesthesia Preprocedure Evaluation (Addendum)
Anesthesia Evaluation  Patient identified by MRN, date of birth, ID band Patient awake    Reviewed: Allergy & Precautions, H&P , NPO status , Patient's Chart, lab work & pertinent test results  Airway Mallampati: II TM Distance: >3 FB Neck ROM: Full    Dental  (+) Edentulous Upper, Edentulous Lower and Dental Advisory Given   Pulmonary former smoker,  breath sounds clear to auscultation  Pulmonary exam normal       Cardiovascular hypertension, Pt. on medications + CAD (Patient denies cardiac symptoms) Rhythm:Regular Rate:Normal     Neuro/Psych negative neurological ROS  negative psych ROS   GI/Hepatic negative GI ROS, Neg liver ROS,   Endo/Other  diabetes, Type 2, Oral Hypoglycemic Agents  Renal/GU negative Renal ROS  negative genitourinary   Musculoskeletal negative musculoskeletal ROS (+)   Abdominal   Peds  Hematology negative hematology ROS (+)   Anesthesia Other Findings   Reproductive/Obstetrics                          Anesthesia Physical Anesthesia Plan  ASA: III  Anesthesia Plan: General   Post-op Pain Management:    Induction: Intravenous  Airway Management Planned: Oral ETT  Additional Equipment:   Intra-op Plan:   Post-operative Plan: Extubation in OR  Informed Consent: I have reviewed the patients History and Physical, chart, labs and discussed the procedure including the risks, benefits and alternatives for the proposed anesthesia with the patient or authorized representative who has indicated his/her understanding and acceptance.   Dental advisory given  Plan Discussed with: CRNA  Anesthesia Plan Comments:         Anesthesia Quick Evaluation

## 2012-09-23 NOTE — Anesthesia Postprocedure Evaluation (Signed)
Anesthesia Post Note  Patient: Rodney Love  Procedure(s) Performed: Procedure(s) (LRB): LAPAROSCOPIC INGUINAL HERNIA with umbilical hernia (N/A) INSERTION OF MESH (Bilateral)  Anesthesia type: General  Patient location: PACU  Post pain: Pain level controlled  Post assessment: Post-op Vital signs reviewed  Last Vitals:  Filed Vitals:   09/23/12 1153  BP: 162/71  Pulse: 78  Temp: 36.3 C  Resp: 16    Post vital signs: Reviewed  Level of consciousness: sedated  Complications: No apparent anesthesia complications

## 2012-09-23 NOTE — H&P (Signed)
Rodney Love is a 67 y.o. male. Referred by Dr. Renne Crigler for evaluation of RIH. This was noticed on physical exam by his primary doctor. And he said that he noticed a bulge in his right groin and brought this up to his doctor in an inguinal hernia was confirmed. He says that this bulge is periodically when he stands for prolonged periods but doesn't really cause any discomfort. He denies any left inguinal bulge. He denies any obstructive symptoms. He says his current last colonoscopy. There is no exacerbating or relieving factors. I have again seen and evaluated him and he says that he is not having much pain but does note some mild discomfort on the right with coughing.  His main complaint is constipation.  HPI  Past Medical History   Diagnosis  Date   .  Diabetes mellitus without complication    .  Hypertension    .  Hyperlipidemia     Past Surgical History   Procedure  Laterality  Date   .  Back surgery     No family history on file.  Social History  History   Substance Use Topics   .  Smoking status:  Former Games developer   .  Smokeless tobacco:  Former Neurosurgeon     Quit date:  08/06/1991   .  Alcohol Use:  No   Allergies no known allergies  Current Outpatient Prescriptions   Medication  Sig  Dispense  Refill   .  amLODipine (NORVASC) 2.5 MG tablet  Take 2.5 mg by mouth daily.     Marland Kitchen  aspirin 325 MG tablet  Take 325 mg by mouth daily.     .  benazepril-hydrochlorthiazide (LOTENSIN HCT) 20-25 MG per tablet      .  fish oil-omega-3 fatty acids 1000 MG capsule  Take 2 g by mouth daily.     .  metFORMIN (GLUCOPHAGE) 1000 MG tablet  Take 1,000 mg by mouth 2 (two) times daily with a meal.     .  Multiple Vitamin (MULTIVITAMIN) tablet  Take 1 tablet by mouth daily.     .  ONE TOUCH ULTRA TEST test strip      .  ONETOUCH DELICA LANCETS 33G MISC      .  rosuvastatin (CRESTOR) 5 MG tablet  Take 5 mg by mouth daily.      No current facility-administered medications for this visit.   Review of Systems   Review of Systems  All other review of systems negative or noncontributory except as stated in the HPI  Wt Readings from Last 3 Encounters:  09/18/12 181 lb 2 oz (82.158 kg)  08/13/12 181 lb 12.8 oz (82.464 kg)  08/05/12 182 lb 12.8 oz (82.918 kg)   Temp Readings from Last 3 Encounters:  09/23/12 97.8 F (36.6 C) Oral  09/23/12 97.8 F (36.6 C) Oral  09/18/12 97.6 F (36.4 C) Oral   BP Readings from Last 3 Encounters:  09/23/12 148/72  09/23/12 148/72  09/18/12 134/72   Pulse Readings from Last 3 Encounters:  09/23/12 77  09/23/12 77  09/18/12 62    Physical Exam  Physical Exam  Physical Exam  Vitals reviewed.  Constitutional: He is oriented to person, place, and time. He appears well-developed and well-nourished. No distress.  HENT:  Head: Normocephalic and atraumatic.  Mouth/Throat: No oropharyngeal exudate.  Eyes: Conjunctivae and EOM are normal. Pupils are equal, round, and reactive to light. Right eye exhibits no discharge. Left eye exhibits no  discharge. No scleral icterus.  Neck: Normal range of motion. No tracheal deviation present.  Cardiovascular: Normal rate, regular rhythm and normal heart sounds.  Pulmonary/Chest: Effort normal and breath sounds normal. No stridor. No respiratory distress. He has no wheezes. He has no rales. He exhibits no tenderness.  Abdominal: Soft. Bowel sounds are normal. He exhibits no distension and no mass. There is no tenderness. There is no rebound and no guarding. he has small reducible bilateral inguinal hernias on exam as well as a tiny reducible umbilical hernia Musculoskeletal: Normal range of motion. He exhibits no edema and no tenderness.  Neurological: He is alert and oriented to person, place, and time.  Skin: Skin is warm and dry. No rash noted. He is not diaphoretic. No erythema. No pallor.  Psychiatric: He has a normal mood and affect. His behavior is normal. Judgment and thought content normal.  Data Reviewed   Assessment  Bilateral inguinal hernias-reducible  Umbilical hernia-reducible  He does have bilateral small asymptomatic and reducible inguinal hernias as well as a small reducible umbilical hernia on exam.  We have decided to proceed with lap bilat. Inguinal hernia repair with umbilical hernia repair with mesh.  I have seen and examined him today and the sites marked.  Risks of the procedure again discussed including infection, bleeding, pain, scarring, recurrence, nerve injury, injury to testicle or vas deferens, bowel injury, and need for open procedure again discussed and he desires to proceed with lap bilat inguinal hernia repair with mesh and umbilical hernia repair.

## 2012-09-23 NOTE — Transfer of Care (Signed)
Immediate Anesthesia Transfer of Care Note  Patient: Rodney Love  Procedure(s) Performed: Procedure(s): LAPAROSCOPIC INGUINAL HERNIA with umbilical hernia (N/A) INSERTION OF MESH (Bilateral)  Patient Location: PACU  Anesthesia Type:General  Level of Consciousness: sedated and patient cooperative  Airway & Oxygen Therapy: Patient Spontanous Breathing and Patient connected to face mask oxygen  Post-op Assessment: Report given to PACU RN and Post -op Vital signs reviewed and stable  Post vital signs: Reviewed and stable  Complications: No apparent anesthesia complications

## 2012-09-23 NOTE — Op Note (Signed)
NAME:  Rodney Love, Rodney Love NO.:  [id number]  MEDICAL RECORD NO.:  [id number]  LOCATION:  [facility]                         FACILITY:  [facility]  PHYSICIAN:  Lodema Pilot, MD       DATE OF BIRTH:  1945/10/13  DATE OF PROCEDURE:  09/23/2012 DATE OF DISCHARGE:                              OPERATIVE REPORT   PROCEDURE:  Laparoscopic bilateral inguinal hernia repair with mesh and umbilical hernia repair.  PREOPERATIVE DIAGNOSIS:  Bilateral inguinal hernia and umbilical hernia.  POSTOPERATIVE DIAGNOSIS:  Bilateral inguinal hernia and umbilical hernia.  SURGEON:  Lodema Pilot, MD  ASSISTANT:  None.  ANESTHESIA:  General endotracheal anesthesia with 50 mL 1% lidocaine with epinephrine and 0.25% Marcaine in a 50:50 mixture.  FLUIDS:  1200 mL of crystalloid.  ESTIMATED BLOOD LOSS:  Minimal.  DRAINS:  None.  SPECIMENS:  None.  COMPLICATIONS:  None apparent.  FINDINGS:  Indirect right inguinal hernia, direct left inguinal hernia and preperitoneal repair with 14 cm x 30 cm UltraPro mesh.  Primary umbilical hernia repair with 0 Ethibond sutures.  INDICATION FOR PROCEDURE:  Rodney Love is a 67 year old male with a mildly symptomatic right inguinal hernia and left inguinal hernia also on exam.  He also had a small umbilical hernia on exam.  OPERATIVE DETAILS:  Rodney Love was seen and evaluated in the preoperative area and risks and benefits of procedure were again discussed in lay terms.  Informed consent was obtained.  He was examined and confirmed to have bilateral inguinal hernias and a small umbilical hernia and the surgical sites were marked prior to anesthetic administration.  He was given prophylactic antibiotics and taken to the operating room, placed on the table in supine position.  General endotracheal anesthesia was obtained.  Foley catheters were placed.  His scrotum and abdomen were prepped and draped in a standard surgical fashion.  Then, procedure time-out  was performed with all operative team members confirming proper patient and procedure.  A semicircular infraumbilical incision was made in the skin and dissection carried down through the subcutaneous tissue using Bovie electrocautery.  The abdominal wall fascia was sharply incised and the rectus muscle was identified.  This was elevated and the preperitoneal space was divided bluntly with my finger.  I placed a space maker balloon hernia system into the preperitoneal space at the pubic tubercle and insufflated this with 25 pumps on the balloon under direct visualization to develop the preperitoneal space.  Then, two 5 mm ports were placed in the midline after 12 mm of pneumoperitoneum was obtained.  The suprapubic port was placed 2 cm above the pubic tubercle to avoid injury to the bladder approximately 2 fingerbreadths above the pubis to minimize risk of injury to the bladder.  I then bluntly developed the preperitoneal space laterally to the right groin.  The nerves were seen along the lateral abdominal wall near the iliopubic tract.  I dissected medially to the pubic tubercle, which was easily visualized already from the space maker balloon.  Cooper's ligament was identified.  He did not have any direct hernia on the right.  He did have a fairly tenacious and good sized indirect hernia sac on  the right, and the cord structures were bluntly dissected from the peritoneum and hernia sac, and after I was able to identify the cord structures, the hernia sac was skeletonized and reduced from the inguinal canal.  A small tear was made in the peritoneum and the hernia sac, and this was easily closed with a few hemoclips.  After I was confident that the hernia sac was reduced far enough posterior to where the vas deferens turns medially, I cleared the left groin and developed the space laterally to the anterior superior iliac spine and carried this medially to the cord structures.   The peritoneum was easily taken down from the cord structures as he did not have an indirect hernia on this side.  The cord structures were easily identified, but he did have a hernia in the direct space.  This hernia sac reduced easily and the Cooper's ligament was cleared.  The abdomen was cleared and I then placed a 14 cm x 30 cm single piece of UltraPro mesh medially at the pubic tubercle.  I then marked the center of the mesh and affixed this at the pubic tubercle and spread the mesh out laterally to the anterior superior iliac spine over both inguinal canals.  Tack was placed laterally on the anterior abdominal wall and medial and anterior to the anterior superior iliac spine keeping the mesh in place laterally on both the right and left.  I actually placed another absorbable tack on Cooper's ligament on both sides to help the mesh lay down flat covering all of the hernia defects.  The mesh actually appeared to lay in there very nicely and then, I made sure that the hernia sacs bilaterally were reduced posterior to the inferior edge of the mesh and then using Tisseel fibrin glue, I glued the lower aspect of the mesh to the cord structures and placed the hernia sac on top of the mesh and I filled the preperitoneal space with 40 mL of 1% lidocaine with epinephrine and 0.25% Marcaine in a 50:50 mixture and let the gas out under direct visualization.  I then enlarged the umbilical incision, and in order to perform the umbilical hernia repair, I dissected down to the abdominal wall fascia and bluntly dissected around the umbilical stalk.  Kelly clamp was placed posteriorly and the umbilicus was elevated from the underlying fascia.  He had a tiny 5 mm umbilical defect which appeared to be containing preperitoneal fat and I did not appreciate any other fascial defects.  I approximated this with 2 interrupted 0 Ethibond sutures, which appeared to close the fascial defect adequately.  The  fascial defect that I used to place the 12 mm space maker balloon trocar was closed in similar fashion.  Prior to closing the fascia, I dissected through the peritoneum and let out some of the abdominal gas which leaked through the small opening in the hernia sac.  Then, I closed the anterior fascia with interrupted 0 Ethibond sutures and the fascia was well approximated.  I injected another 10 mL of 1% lidocaine with epinephrine and 0.25% Marcaine at the umbilicus and the wounds were irrigated with sterile saline solution. The wounds were noted to be hemostatic and I tacked down the base of the umbilicus to the underlying fascia with 3-0 Vicryl suture and the dermis was approximated with interrupted 3-0 Vicryl sutures.  The skin edges were approximated with 4-0 Monocryl subcuticular suture and the skin was washed and dried and Dermabond was applied.  Sterile  suction dressing was applied at the umbilicus and all sponge, needle, and instrument counts were correct at the end of the case.  The patient tolerated procedure well without apparent complication.  Foley catheter was removed.  He was stable and ready for transfer to the recovery room.         ______________________________ Lodema Pilot, MD    BL/MEDQ  D:  09/23/2012  T:  09/23/2012  Job:  [id number]

## 2012-09-23 NOTE — Preoperative (Signed)
Beta Blockers   Reason not to administer Beta Blockers:Not Applicable 

## 2012-09-23 NOTE — Progress Notes (Signed)
Having some urinary retention.  Bladder scan with  He looks and feels well otherwise.  Wounds look good.  Will place foley and remove in AM for repeat voiding trial.

## 2012-09-23 NOTE — Brief Op Note (Signed)
09/23/2012  10:55 AM  PATIENT:  Rodney Love  67 y.o. male  PRE-OPERATIVE DIAGNOSIS:  inguinal hernia and umbilical hernia   POST-OPERATIVE DIAGNOSIS:  inguinal hernia and umbilical hernia   PROCEDURE:  Procedure(s): LAPAROSCOPIC INGUINAL HERNIA with umbilical hernia (N/A) INSERTION OF MESH (Bilateral)  SURGEON:  Surgeon(s) and Role:    * Lodema Pilot, DO - Primary  PHYSICIAN ASSISTANT:   ASSISTANTS: none   ANESTHESIA:   general  EBL:  Total I/O In: 1200 [I.V.:1200] Out: 525 [Urine:500; Blood:25]  BLOOD ADMINISTERED:none  DRAINS: none   LOCAL MEDICATIONS USED:  MARCAINE    and LIDOCAINE   SPECIMEN:  No Specimen  DISPOSITION OF SPECIMEN:  N/A  COUNTS:  YES  TOURNIQUET:  * No tourniquets in log *  DICTATION: .Other Dictation: Dictation Number [id number]  PLAN OF CARE: Discharge to home after PACU  PATIENT DISPOSITION:  PACU - hemodynamically stable.   Delay start of Pharmacological VTE agent (>24hrs) due to surgical blood loss or risk of bleeding: no

## 2012-09-23 NOTE — Progress Notes (Signed)
Report called  Jacqualin Combes RN, Pt. Taken to 5132 via W/C, no problems noted.

## 2012-09-24 ENCOUNTER — Encounter (HOSPITAL_COMMUNITY): Payer: Self-pay | Admitting: General Surgery

## 2012-09-24 NOTE — Progress Notes (Signed)
1 Day Post-Op  Subjective: Looks and feels okay this am. Foley out this am  Objective: Vital signs in last 24 hours: Temp:  [97.4 F (36.3 C)-98.9 F (37.2 C)] 97.8 F (36.6 C) (06/26 0634) Pulse Rate:  [73-83] 73 (06/26 0634) Resp:  [12-21] 16 (06/26 0634) BP: (123-162)/(59-81) 140/61 mmHg (06/26 0634) SpO2:  [93 %-100 %] 98 % (06/26 0634) Weight:  [181 lb 0.8 oz (82.123 kg)] 181 lb 0.8 oz (82.123 kg) (06/25 1730) Last BM Date: 09/22/12  Intake/Output from previous day: 06/25 0701 - 06/26 0700 In: 3382.1 [P.O.:1080; I.V.:2302.1] Out: 5605 [Urine:5580; Blood:25] Intake/Output this shift:    General appearance: alert, cooperative and no distress Resp: nonlabored Cardio: normal rate GI: wounds without infection,  Male genitalia: scrotum okay, slight bruising at base of penis  Lab Results:  No results found for this basename: WBC, HGB, HCT, PLT,  in the last 72 hours BMET No results found for this basename: NA, K, CL, CO2, GLUCOSE, BUN, CREATININE, CALCIUM,  in the last 72 hours PT/INR No results found for this basename: LABPROT, INR,  in the last 72 hours ABG No results found for this basename: PHART, PCO2, PO2, HCO3,  in the last 72 hours  Studies/Results: No results found.  Anti-infectives: Anti-infectives   Start     Dose/Rate Route Frequency Ordered Stop   09/23/12 0553  ceFAZolin (ANCEF) IVPB 2 g/50 mL premix     2 g 100 mL/hr over 30 Minutes Intravenous On call to O.R. 09/23/12 1478 09/23/12 0823      Assessment/Plan: s/p Procedure(s): LAPAROSCOPIC INGUINAL HERNIA with umbilical hernia (N/A) INSERTION OF MESH (Bilateral) he looks better than expected.  should be able to go home today if able to void.  if unable to void by 1300, will place foley and set up urology evaluation  LOS: 1 day    Lodema Pilot DAVID 09/24/2012

## 2012-09-24 NOTE — Discharge Summary (Signed)
Physician Discharge Summary  Patient ID: Rodney Love MRN: [id number] DOB/AGE: 11/03/1945 67 y.o.  Admit date: 09/23/2012 Discharge date: 09/24/2012  Admission Diagnoses: abdominal wall hernias  Discharge Diagnoses: same, urinary retention Active Problems:   * No active hospital problems. *   Discharged Condition: stable  Hospital Course: to OR 09/23/12 for lap bilat inguinal hernia repair and umbilical hernia.  Unable to void postop so foley was placed and he was admitted for observation and urinary retention.  Foley removed on morning of POD 1 and able to void without difficulty.  Stable for discharge to home.  Consults: None  Significant Diagnostic Studies: none  Treatments: surgery: 09/23/12 lap bilat inguinal hernia repair and umbilical hernia repair  Disposition: 01-Home or Self Care  Discharge Orders   Future Appointments Provider Department Dept Phone   10/09/2012 9:00 AM Lodema Pilot, DO [facility], [place] [phone]   Future Orders Complete By Expires     Call MD for:  persistant nausea and vomiting  As directed     Call MD for:  persistant nausea and vomiting  As directed     Call MD for:  redness, tenderness, or signs of infection (pain, swelling, redness, odor or green/yellow discharge around incision site)  As directed     Call MD for:  redness, tenderness, or signs of infection (pain, swelling, redness, odor or green/yellow discharge around incision site)  As directed     Call MD for:  severe uncontrolled pain  As directed     Call MD for:  severe uncontrolled pain  As directed     Call MD for:  temperature >100.4  As directed     Call MD for:  temperature >100.4  As directed     Diet - low sodium heart healthy  As directed     Diet - low sodium heart healthy  As directed     Discharge instructions  As directed     Comments:      Call [phone] to schedule follow up appointment in about 3 weeks. May shower in 48 hours. No lifting more than 10lbs for  4 weeks    Discharge instructions  As directed     Comments:      May shower tomorrow.  No lifting more than 10lbs for 4 weeks Call [phone] for follow up appointment with Dr. Biagio Quint in about 3 weeks.    Discharge wound care:  As directed     Comments:      Remove dressing at belly button tomorrow, the glue at the incisions will come off on its own in a few weeks    Increase activity slowly  As directed     Increase activity slowly  As directed         Medication List    TAKE these medications       amLODipine 5 MG tablet  Commonly known as:  NORVASC  Take 2.5 mg by mouth every morning. Takes 1/2 tablet     aspirin 325 MG tablet  Take 325 mg by mouth daily.     benazepril-hydrochlorthiazide 20-25 MG per tablet  Commonly known as:  LOTENSIN HCT  Take 0.5 tablets by mouth every morning.     docusate sodium 100 MG capsule  Commonly known as:  COLACE  Take 1 capsule (100 mg total) by mouth 2 (two) times daily.     Fish Oil 1200 MG Caps  Take 1,200 mg by mouth every evening.  HYDROcodone-acetaminophen 5-325 MG per tablet  Commonly known as:  NORCO  Take 1 tablet by mouth every 4 (four) hours as needed for pain.     metFORMIN 1000 MG tablet  Commonly known as:  GLUCOPHAGE  Take 1,000 mg by mouth 2 (two) times daily with a meal.     multivitamin tablet  Take 1 tablet by mouth daily.     rosuvastatin 5 MG tablet  Commonly known as:  CRESTOR  Take 5 mg by mouth every evening.         SignedLodema Pilot DAVID 09/24/2012, 2:47 PM

## 2012-10-09 ENCOUNTER — Ambulatory Visit (INDEPENDENT_AMBULATORY_CARE_PROVIDER_SITE_OTHER): Payer: Medicare Other | Admitting: General Surgery

## 2012-10-09 ENCOUNTER — Encounter (INDEPENDENT_AMBULATORY_CARE_PROVIDER_SITE_OTHER): Payer: Self-pay | Admitting: General Surgery

## 2012-10-09 VITALS — BP 120/70 | HR 68 | Temp 98.8°F | Resp 16 | Ht 73.0 in | Wt 181.0 lb

## 2012-10-09 DIAGNOSIS — Z4889 Encounter for other specified surgical aftercare: Secondary | ICD-10-CM

## 2012-10-09 DIAGNOSIS — Z5189 Encounter for other specified aftercare: Secondary | ICD-10-CM

## 2012-10-09 NOTE — Progress Notes (Signed)
Subjective:     Patient ID: Rodney Love, male   DOB: 09-20-1945, 67 y.o.   MRN: [id number]  HPI This patient follows up 2 weeks status post laparoscopic bilateral inguinal hernia repairwith mesh and open umbilical hernia repair. He is not taking any pain medications and says that he is about 80% back to normal. He is tolerating regular diet and his bowels are functioning. He is wondering when he can get back to walking  Review of Systems     Objective:   Physical Exam He is in no acute distress and nontoxic-appearing His incisions are healing nicely without any sign of infection he has normal healing ridge at his umbilicus in some mild thickening of his spermatic cord bilaterally but no evidence of recurrent bulge with Valsalva bilaterally or at the umbilicus    Assessment:     Status post open umbilical hernia repair and bilateral laparoscopic inguinal hernia repair with mesh-doing well He seems to be recovering well from his procedure. There is no evidence of any postoperative complication. I recommended that he continue with another 2 weeks of light duty and then he can gradually increase his activity as tolerated.     Plan:     2 weeks of no lifting when he can increase activity as tolerated.

## 2012-10-28 ENCOUNTER — Encounter: Payer: Self-pay | Admitting: Cardiovascular Disease

## 2012-10-28 ENCOUNTER — Ambulatory Visit (INDEPENDENT_AMBULATORY_CARE_PROVIDER_SITE_OTHER): Payer: Medicare Other | Admitting: Cardiovascular Disease

## 2012-10-28 VITALS — BP 144/78 | HR 61 | Resp 16 | Ht 73.0 in | Wt 180.5 lb

## 2012-10-28 DIAGNOSIS — I10 Essential (primary) hypertension: Secondary | ICD-10-CM

## 2012-10-28 DIAGNOSIS — E7800 Pure hypercholesterolemia, unspecified: Secondary | ICD-10-CM | POA: Insufficient documentation

## 2012-10-28 DIAGNOSIS — E785 Hyperlipidemia, unspecified: Secondary | ICD-10-CM

## 2012-10-28 DIAGNOSIS — I739 Peripheral vascular disease, unspecified: Secondary | ICD-10-CM

## 2012-10-28 DIAGNOSIS — I6529 Occlusion and stenosis of unspecified carotid artery: Secondary | ICD-10-CM

## 2012-10-28 DIAGNOSIS — I6521 Occlusion and stenosis of right carotid artery: Secondary | ICD-10-CM | POA: Insufficient documentation

## 2012-10-28 DIAGNOSIS — I2510 Atherosclerotic heart disease of native coronary artery without angina pectoris: Secondary | ICD-10-CM

## 2012-10-28 DIAGNOSIS — E119 Type 2 diabetes mellitus without complications: Secondary | ICD-10-CM

## 2012-10-28 NOTE — Assessment & Plan Note (Signed)
Excellent control on metformin monotherapy, hemoglobin A1c 5.6%

## 2012-10-28 NOTE — Assessment & Plan Note (Signed)
His last lipid profile a year ago was excellent. He tells me Dr. Renne Crigler has recently rechecked it and it was still all in the desirable range

## 2012-10-28 NOTE — Progress Notes (Signed)
Patient ID: Rodney Love, male   DOB: 14-Jun-1945, 67 y.o.   MRN: [id number]     Reason for office visit Peripheral arterial disease followup  Rodney Love has done well in the year since his last appointment. 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 He walks for an hour to an hour and a half a day every day and his wife cannot keep up with his space. He had a remote angioplasty of the left external iliac may be as long as 20 years ago and has not had symptoms since. He denies any nonhealing wounds or other ischemic changes in the LAD.  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 He has never had a neurological event.  He has numerous but well treated this factors for atherosclerosis including type 2 diabetes, hyperlipidemia and hypertension. He does not smoke and he exercises on a daily basis.    No Known Allergies  Current Outpatient Prescriptions  Medication Sig Dispense Refill  . amLODipine (NORVASC) 5 MG tablet Take 2.5 mg by mouth every morning. Takes 1/2 tablet      . aspirin 325 MG tablet Take 325 mg by mouth daily.      . benazepril-hydrochlorthiazide (LOTENSIN HCT) 20-25 MG per tablet Take 0.5 tablets by mouth every morning.       . docusate sodium (COLACE) 100 MG capsule Take 1 capsule (100 mg total) by mouth 2 (two) times daily.  60 capsule  0  . metFORMIN (GLUCOPHAGE) 1000 MG tablet Take 1,000 mg by mouth 2 (two) times daily with a meal.      . Multiple Vitamin (MULTIVITAMIN) tablet Take 1 tablet by mouth daily.      . Omega-3 Fatty Acids (FISH OIL) 1200 MG CAPS Take 1,200 mg by mouth every evening.      . ONE TOUCH ULTRA TEST test strip       . ONETOUCH DELICA LANCETS 33G MISC       . rosuvastatin (CRESTOR) 5 MG tablet Take 5 mg by mouth every evening.        No current  facility-administered medications for this visit.    Past Medical History  Diagnosis Date  . Diabetes mellitus without complication   . Hypertension   . Hyperlipidemia   . Environmental allergies   . Arthritis   . Constipation     OCCASIONAL  . Nocturia   . Inguinal hernia   . Coronary artery disease     Past Surgical History  Procedure Laterality Date  . Back surgery    . Cardiac catheterization  2008  . Inguinal hernia repair N/A 09/23/2012    Procedure: LAPAROSCOPIC INGUINAL HERNIA with umbilical hernia;  Surgeon: Lodema Pilot, DO;  Location: [facility];  Service: General;  Laterality: N/A;  . Insertion of mesh Bilateral 09/23/2012    Procedure: INSERTION OF MESH;  Surgeon: Lodema Pilot, DO;  Location: [facility];  Service: General;  Laterality: Bilateral;  . Hernia repair  [id number]    Family History  Problem Relation Age of Onset  . Diabetes Mother   . Stroke Father   . Stroke Brother   . Cancer Sister   . Cancer Sister     History   Social History  . Marital Status: Married    Spouse Name: N/A  Number of Children: N/A  . Years of Education: N/A   Occupational History  . Not on file.   Social History Main Topics  . Smoking status: Former Games developer  . Smokeless tobacco: Former Neurosurgeon    Quit date: 08/06/1991  . Alcohol Use: No  . Drug Use: No  . Sexually Active: Not on file   Other Topics Concern  . Not on file   Social History Narrative  . No narrative on file    Review of systems: The patient specifically denies any chest pain at rest or with exertion, dyspnea at rest or with exertion, orthopnea, paroxysmal nocturnal dyspnea, syncope, palpitations, focal neurological deficits, intermittent claudication, lower extremity edema, unexplained weight gain, cough, hemoptysis or wheezing.  The patient also denies abdominal pain, nausea, vomiting, dysphagia, diarrhea, constipation, polyuria, polydipsia, dysuria, hematuria, frequency, urgency, abnormal bleeding or  bruising, fever, chills, unexpected weight changes, mood swings, change in skin or hair texture, change in voice quality, auditory or visual problems, allergic reactions or rashes, new musculoskeletal complaints other than usual "aches and pains".   PHYSICAL EXAM BP 144/78  Pulse 61  Resp 16  Ht 6\' 1"  (1.854 m)  Wt 180 lb 8 oz (81.874 kg)  BMI 23.82 kg/m2  General: Alert, oriented x3, no distress Head: no evidence of trauma, PERRL, EOMI, no exophtalmos or lid lag, no myxedema, no xanthelasma; normal ears, nose and oropharynx Neck: normal jugular venous pulsations and no hepatojugular reflux; brisk carotid pulses without delay and bilateral carotid bruits Chest: clear to auscultation, no signs of consolidation by percussion or palpation, normal fremitus, symmetrical and full respiratory excursions Cardiovascular: normal position and quality of the apical impulse, regular rhythm, normal first and second heart sounds, no murmurs, rubs or gallops Abdomen: no tenderness or distention, no masses by palpation, no abnormal pulsatility or arterial bruits, normal bowel sounds, no hepatosplenomegaly Extremities: no clubbing, cyanosis or edema; 2+ radial, ulnar and brachial pulses bilaterally; 2+ right femoral, posterior tibial and dorsalis pedis pulses; 2+ left femoral, 1+posterior tibial and 2+dorsalis pedis pulses; no subclavian bruit; allowed prolonged left femoral bruit Neurological: grossly nonfocal   EKG:  normal sinus rhythm, minor nonspecific intraventricular conduction abnormality   Lipid Panel  Hemoglobin A1c 5.6% total cholesterol 125 triglycerides 63 HDL 44 LDL 68 creatinine 1.0 normal liver function tests  BMET    Component Value Date/Time   NA 138 09/18/2012 1220   K 4.4 09/18/2012 1220   CL 103 09/18/2012 1220   CO2 26 09/18/2012 1220   GLUCOSE 114* 09/18/2012 1220   BUN 11 09/18/2012 1220   CREATININE 0.94 09/18/2012 1220   CALCIUM 9.8 09/18/2012 1220   GFRNONAA 84* 09/18/2012 1220     GFRAA >90 09/18/2012 1220     ASSESSMENT AND PLAN PAD (peripheral artery disease) s/p remote left external iliac artery angioplasty He has moderately severe stenoses in the left iliac artery in the mid left superficial femoral artery with a left-sided ankle-brachial index of 0.88, but has no symptoms of intermittent claudication despite a very active lifestyle. He has no evidence of poor wound healing or other signs of ischemia in that limb. He has a moderate stenosis of the right iliac artery as well. This factors are all appropriately addressed.  Coronary atherosclerosis No significant stenoses by coronary angiography in 2008 (20-30% proximal LAD, 20-30% proximal to mid RCA) normal left ventricular systolic function  DM2 (diabetes mellitus, type 2) Excellent control on metformin monotherapy, hemoglobin A1c 5.6%  HTN (hypertension) Slightly elevated blood pressure  here today, frequency checked home blood pressures have been in the normal range. No adjustments were made to his medication  Hyperlipidemia His last lipid profile a year ago was excellent. He tells me Dr. Renne Crigler has recently rechecked it and it was still all in the desirable range  Carotid occlusion, right Minimal contralateral plaque; no history of stroke or TIA  Orders Placed This Encounter  Procedures  . EKG 12-Lead   No orders of the defined types were placed in this encounter.    Junious Silk, MD, [facility] [phone] office [phone] pager

## 2012-10-28 NOTE — Assessment & Plan Note (Signed)
No significant stenoses by coronary angiography in 2008 (20-30% proximal LAD, 20-30% proximal to mid RCA) normal left ventricular systolic function

## 2012-10-28 NOTE — Patient Instructions (Addendum)
Your physician recommends that you schedule a follow-up appointment in: One year.  

## 2012-10-28 NOTE — Assessment & Plan Note (Signed)
Slightly elevated blood pressure here today, frequency checked home blood pressures have been in the normal range. No adjustments were made to his medication

## 2012-10-28 NOTE — Assessment & Plan Note (Signed)
Minimal contralateral plaque; no history of stroke or TIA

## 2012-10-28 NOTE — Assessment & Plan Note (Signed)
He has moderately severe stenoses in the left iliac artery in the mid left superficial femoral artery with a left-sided ankle-brachial index of 0.88, but has no symptoms of intermittent claudication despite a very active lifestyle. He has no evidence of poor wound healing or other signs of ischemia in that limb. He has a moderate stenosis of the right iliac artery as well. This factors are all appropriately addressed.

## 2012-11-06 ENCOUNTER — Encounter: Payer: Self-pay | Admitting: Cardiovascular Disease

## 2013-01-20 ENCOUNTER — Telehealth (HOSPITAL_COMMUNITY): Payer: Self-pay | Admitting: *Deleted

## 2013-01-28 ENCOUNTER — Other Ambulatory Visit (HOSPITAL_COMMUNITY): Payer: Self-pay | Admitting: Cardiovascular Disease

## 2013-01-28 ENCOUNTER — Ambulatory Visit (HOSPITAL_COMMUNITY)
Admission: RE | Admit: 2013-01-28 | Discharge: 2013-01-28 | Disposition: A | Discharge status: Home / Self Care | Payer: Medicare Other | Source: Ambulatory Visit | Attending: Cardiovascular Disease | Admitting: Cardiovascular Disease

## 2013-01-28 DIAGNOSIS — I739 Peripheral vascular disease, unspecified: Secondary | ICD-10-CM | POA: Insufficient documentation

## 2013-01-28 DIAGNOSIS — I70219 Atherosclerosis of native arteries of extremities with intermittent claudication, unspecified extremity: Secondary | ICD-10-CM

## 2013-01-28 NOTE — Progress Notes (Signed)
Bilateral Lower Ext. Arterial Duplex Completed. Omarian Jaquith, BS, RDMS, RVT  

## 2013-02-18 ENCOUNTER — Encounter: Payer: Self-pay | Admitting: Cardiovascular Disease

## 2013-02-18 ENCOUNTER — Ambulatory Visit (INDEPENDENT_AMBULATORY_CARE_PROVIDER_SITE_OTHER): Payer: Medicare Other | Admitting: Cardiovascular Disease

## 2013-02-18 VITALS — BP 122/64 | HR 68 | Ht 72.0 in | Wt 186.4 lb

## 2013-02-18 DIAGNOSIS — I739 Peripheral vascular disease, unspecified: Secondary | ICD-10-CM

## 2013-02-18 NOTE — Assessment & Plan Note (Signed)
Patient was referred by Dr. Royann Shivers for evaluation of PAD. He had lower extremity arterial Dopplers done 01/28/13 revealing an ABI 1.1 on the right and an ABI of 0.4 and left. It appears he's had mild progression of disease in the right external iliac artery and occlusion of his left SFA. He is completely symptomatic and denies claudication. At this point we are going to get you to follow him conservatively and get lower extremity arterial Dopplers on him every 6 months. I will see him back as needed should he develop claudication. Otherwise, he'll followup with his primary cardiologist.

## 2013-02-18 NOTE — Patient Instructions (Signed)
Follow up with Dr Allyson Sabal as needed.    We will do lower extremity arterial dopplers every 6 months starting 6 months from now.

## 2013-02-18 NOTE — Progress Notes (Signed)
02/18/2013 Frances Maywood   1945/06/03  [id number]  Primary Physician Londell Moh, MD Primary Cardiologist: Runell Gess MD Roseanne Reno   HPI:  Rodney Love is a 67 year old married Caucasian male father of 3, grandfather to 4 grandchildren accompanied by his wife and daughter today. He is referred by Dr. Royann Shivers for last evaluation. His problems include hypertension, type 2 diabetes, hyperlipidemia. He had a cardiac catheterization performed 2008 revealing minimal CAD with normal function. He is a known occluded right carotid artery. He does have PAB by duplex ultrasound but still is symptomatic from this.   Current Outpatient Prescriptions  Medication Sig Dispense Refill  . amLODipine (NORVASC) 5 MG tablet Take 2.5 mg by mouth every morning. Takes 1/2 tablet      . aspirin 325 MG tablet Take 325 mg by mouth daily.      Marland Kitchen atorvastatin (LIPITOR) 20 MG tablet Take 0.5 tablets by mouth daily.      . benazepril-hydrochlorthiazide (LOTENSIN HCT) 20-25 MG per tablet Take 0.5 tablets by mouth every morning.       . docusate sodium (COLACE) 100 MG capsule Take 1 capsule (100 mg total) by mouth 2 (two) times daily.  60 capsule  0  . metFORMIN (GLUCOPHAGE) 1000 MG tablet Take 1,000 mg by mouth 2 (two) times daily with a meal.      . Multiple Vitamin (MULTIVITAMIN) tablet Take 1 tablet by mouth daily.      . Omega-3 Fatty Acids (FISH OIL) 1200 MG CAPS Take 1,200 mg by mouth every evening.      . ONE TOUCH ULTRA TEST test strip       . ONETOUCH DELICA LANCETS 33G MISC       . tamsulosin (FLOMAX) 0.4 MG CAPS capsule Take 1 capsule by mouth daily.       No current facility-administered medications for this visit.    No Known Allergies  History   Social History  . Marital Status: Married    Spouse Name: N/A    Number of Children: N/A  . Years of Education: N/A   Occupational History  . Not on file.   Social History Main Topics  . Smoking status: Former  Games developer  . Smokeless tobacco: Former Neurosurgeon    Quit date: 08/06/1991  . Alcohol Use: No  . Drug Use: No  . Sexual Activity: Not on file   Other Topics Concern  . Not on file   Social History Narrative  . No narrative on file     Review of Systems: General: negative for chills, fever, night sweats or weight changes.  Cardiovascular: negative for chest pain, dyspnea on exertion, edema, orthopnea, palpitations, paroxysmal nocturnal dyspnea or shortness of breath Dermatological: negative for rash Respiratory: negative for cough or wheezing Urologic: negative for hematuria Abdominal: negative for nausea, vomiting, diarrhea, bright red blood per rectum, melena, or hematemesis Neurologic: negative for visual changes, syncope, or dizziness All other systems reviewed and are otherwise negative except as noted above.    Blood pressure 122/64, pulse 68, height 6' (1.829 m), weight 186 lb 6.4 oz (84.55 kg).  General appearance: alert and no distress Neck: no adenopathy, no carotid bruit, no JVD, supple, symmetrical, trachea midline and thyroid not enlarged, symmetric, no tenderness/mass/nodules Lungs: clear to auscultation bilaterally Heart: regular rate and rhythm, S1, S2 normal, no murmur, click, rub or gallop Extremities: extremities normal, atraumatic, no cyanosis or edema and 2+ right pedal pulse absent left pedal pulse  EKG  not performed today  ASSESSMENT AND PLAN:   PAD (peripheral artery disease) s/p remote left external iliac artery angioplasty Patient was referred by Dr. Royann Shivers for evaluation of PAD. He had lower extremity arterial Dopplers done 01/28/13 revealing an ABI 1.1 on the right and an ABI of 0.4 and left. It appears he's had mild progression of disease in the right external iliac artery and occlusion of his left SFA. He is completely symptomatic and denies claudication. At this point we are going to get you to follow him conservatively and get lower extremity arterial  Dopplers on him every 6 months. I will see him back as needed should he develop claudication. Otherwise, he'll followup with his primary cardiologist.      Runell Gess MD [facility], [facility] 02/18/2013 4:51 PM

## 2013-06-22 ENCOUNTER — Encounter: Payer: Self-pay | Admitting: Cardiovascular Disease

## 2013-06-22 ENCOUNTER — Ambulatory Visit (INDEPENDENT_AMBULATORY_CARE_PROVIDER_SITE_OTHER): Payer: Medicare Other | Admitting: Cardiovascular Disease

## 2013-06-22 VITALS — BP 128/60 | HR 72 | Ht 73.0 in | Wt 183.0 lb

## 2013-06-22 DIAGNOSIS — I6521 Occlusion and stenosis of right carotid artery: Secondary | ICD-10-CM

## 2013-06-22 DIAGNOSIS — I739 Peripheral vascular disease, unspecified: Secondary | ICD-10-CM

## 2013-06-22 DIAGNOSIS — I6529 Occlusion and stenosis of unspecified carotid artery: Secondary | ICD-10-CM

## 2013-06-22 NOTE — Progress Notes (Signed)
06/22/2013 Rodney Love   [birth year]  [id number]  Primary Physician Horatio Pel, MD Primary Cardiologist: Lorretta Harp MD Renae Gloss   HPI:  Rodney Love is a 68 year old married Caucasian male father of 41, grandfather to 4 grandchildren accompanied by his wife and daughter today. He is referred by Dr. Sallyanne Love for last evaluation. His problems include hypertension, type 2 diabetes, hyperlipidemia. He had a cardiac catheterization performed 2008 revealing minimal CAD with normal function. He is a known occluded right carotid artery. He does have PAD by duplex ultrasound but asymptomatic from this. I saw him approximately 4 months ago and decided to follow him as needed. I do not think his Doppler results explained his symptoms. He returns today at the request of his orthopedic surgeon because of new onset left hip pain which again does not sound vascular in nature.    Current Outpatient Prescriptions  Medication Sig Dispense Refill  . amLODipine (NORVASC) 5 MG tablet Take 2.5 mg by mouth every morning. Takes 1/2 tablet      . aspirin 325 MG tablet Take 325 mg by mouth daily.      Marland Kitchen atorvastatin (LIPITOR) 20 MG tablet Take 0.5 tablets by mouth daily.      . benazepril-hydrochlorthiazide (LOTENSIN HCT) 20-25 MG per tablet Take 0.5 tablets by mouth every morning.       . meloxicam (MOBIC) 15 MG tablet Take 15 mg by mouth as needed.       . metFORMIN (GLUCOPHAGE) 1000 MG tablet Take 1,000 mg by mouth 2 (two) times daily with a meal.      . Multiple Vitamin (MULTIVITAMIN) tablet Take 1 tablet by mouth daily.      . Omega-3 Fatty Acids (FISH OIL) 1200 MG CAPS Take 1,200 mg by mouth every evening.      . ONE TOUCH ULTRA TEST test strip       . ONETOUCH DELICA LANCETS 25E MISC       . tamsulosin (FLOMAX) 0.4 MG CAPS capsule Take 1 capsule by mouth daily.       No current facility-administered medications for this visit.    No Known Allergies  History   Social  History  . Marital Status: Married    Spouse Name: N/A    Number of Children: N/A  . Years of Education: N/A   Occupational History  . Not on file.   Social History Main Topics  . Smoking status: Former Research scientist (life sciences)  . Smokeless tobacco: Former Systems developer    Quit date: 08/06/1991  . Alcohol Use: No  . Drug Use: No  . Sexual Activity: Not on file   Other Topics Concern  . Not on file   Social History Narrative  . No narrative on file     Review of Systems: General: negative for chills, fever, night sweats or weight changes.  Cardiovascular: negative for chest pain, dyspnea on exertion, edema, orthopnea, palpitations, paroxysmal nocturnal dyspnea or shortness of breath Dermatological: negative for rash Respiratory: negative for cough or wheezing Urologic: negative for hematuria Abdominal: negative for nausea, vomiting, diarrhea, bright red blood per rectum, melena, or hematemesis Neurologic: negative for visual changes, syncope, or dizziness All other systems reviewed and are otherwise negative except as noted above.    Blood pressure 128/60, pulse 72, height 6\' 1"  (1.854 m), weight 183 lb (83.008 kg).  General appearance: alert and no distress Neck: no adenopathy, no JVD, supple, symmetrical, trachea midline, thyroid not enlarged, symmetric, no tenderness/mass/nodules and  soft right carotid bruit Lungs: clear to auscultation bilaterally Heart: regular rate and rhythm, S1, S2 normal, no murmur, click, rub or gallop Extremities: extremities normal, atraumatic, no cyanosis or edema and 2+ right pedal pulse, absent left pedal pulse  EKG not performed today  ASSESSMENT AND PLAN:   Carotid occlusion, right The patient had an occluded right carotid artery. He has a right carotid bruit. I do not see recent carotid Doppler studies. Repeat carotid duplex  PAD (peripheral artery disease) s/p remote left external iliac artery angioplasty The patient returns today because of left hip pain.  He sought orthopedic surgeon who wanted to rule out a vascular etiology. This pain does not sound circulatory. His last lower extremity arterial Doppler studies performed 01/28/13 revealed a right ABI of 1.1, left ABI of 0.84 with an occluded left SFA. Patient symptoms are not consistent with his Doppler results. I will see her back when necessary.      Lorretta Harp MD FACP,FACC,FAHA, [facility] 06/22/2013 9:29 AM

## 2013-06-22 NOTE — Assessment & Plan Note (Signed)
The patient had an occluded right carotid artery. He has a right carotid bruit. I do not see recent carotid Doppler studies. Repeat carotid duplex

## 2013-06-22 NOTE — Assessment & Plan Note (Signed)
The patient returns today because of left hip pain. He sought orthopedic surgeon who wanted to rule out a vascular etiology. This pain does not sound circulatory. His last lower extremity arterial Doppler studies performed 01/28/13 revealed a right ABI of 1.1, left ABI of 0.84 with an occluded left SFA. Patient symptoms are not consistent with his Doppler results. I will see her back when necessary.

## 2013-06-22 NOTE — Patient Instructions (Addendum)
Your physician has requested that you have a lower extremity arterial duplex. This test is an ultrasound of the arteries in the legs. It looks at arterial blood flow in the legs. Allow one hour for Lower Arterial scans. There are no restrictions or special instructions  Your physician has requested that you have a carotid duplex. This test is an ultrasound of the carotid arteries in your neck. It looks at blood flow through these arteries that supply the brain with blood. Allow one hour for this exam. There are no restrictions or special instructions.  Dr Gwenlyn Found recommends that you follow-up with him as needed.  You will receive a call or letter to schedule your yearly appointment with Dr Sallyanne Kuster in July.

## 2013-06-25 ENCOUNTER — Ambulatory Visit (HOSPITAL_COMMUNITY)
Admission: RE | Admit: 2013-06-25 | Discharge: 2013-06-25 | Disposition: A | Discharge status: Home / Self Care | Payer: Medicare Other | Source: Ambulatory Visit | Attending: Internal Medicine | Admitting: Internal Medicine

## 2013-06-25 ENCOUNTER — Ambulatory Visit (HOSPITAL_BASED_OUTPATIENT_CLINIC_OR_DEPARTMENT_OTHER)
Admission: RE | Admit: 2013-06-25 | Discharge: 2013-06-25 | Disposition: A | Discharge status: Home / Self Care | Payer: Medicare Other | Source: Ambulatory Visit | Attending: Internal Medicine | Admitting: Internal Medicine

## 2013-06-25 DIAGNOSIS — I739 Peripheral vascular disease, unspecified: Secondary | ICD-10-CM

## 2013-06-25 DIAGNOSIS — I70219 Atherosclerosis of native arteries of extremities with intermittent claudication, unspecified extremity: Secondary | ICD-10-CM

## 2013-06-25 DIAGNOSIS — I6529 Occlusion and stenosis of unspecified carotid artery: Secondary | ICD-10-CM

## 2013-06-25 DIAGNOSIS — I6521 Occlusion and stenosis of right carotid artery: Secondary | ICD-10-CM

## 2013-06-25 NOTE — Progress Notes (Signed)
Arterial Duplex Lower Ext. Completed. Derran Sear, BS, RDMS, RVT  

## 2013-06-25 NOTE — Progress Notes (Signed)
Carotid Duplex Completed. Varonica Siharath, BS, RDMS, RVT  

## 2013-06-28 NOTE — Progress Notes (Signed)
Carotid Duplex Completed. Eyvette Cordon, BS, RDMS, RVT  

## 2013-07-06 ENCOUNTER — Telehealth: Payer: Self-pay | Admitting: *Deleted

## 2013-07-06 DIAGNOSIS — I739 Peripheral vascular disease, unspecified: Secondary | ICD-10-CM

## 2013-07-06 NOTE — Telephone Encounter (Signed)
Order placed for repeat lower extremity arterial doppler in 1 year  

## 2013-07-06 NOTE — Telephone Encounter (Signed)
Message copied by Chauncy Lean on Tue Jul 06, 2013  4:13 PM ------      Message from: Lorretta Harp      Created: Wed Jun 30, 2013 11:51 PM       No change from prior study. Repeat in 12 months. ------

## 2013-10-26 ENCOUNTER — Encounter: Payer: Self-pay | Admitting: Cardiovascular Disease

## 2013-10-29 ENCOUNTER — Encounter: Payer: Self-pay | Admitting: Cardiovascular Disease

## 2013-10-29 ENCOUNTER — Ambulatory Visit (INDEPENDENT_AMBULATORY_CARE_PROVIDER_SITE_OTHER): Payer: Medicare Other | Admitting: Cardiovascular Disease

## 2013-10-29 VITALS — BP 124/60 | HR 62 | Resp 16 | Ht 72.0 in | Wt 180.3 lb

## 2013-10-29 DIAGNOSIS — E1159 Type 2 diabetes mellitus with other circulatory complications: Secondary | ICD-10-CM

## 2013-10-29 DIAGNOSIS — I10 Essential (primary) hypertension: Secondary | ICD-10-CM

## 2013-10-29 DIAGNOSIS — I2510 Atherosclerotic heart disease of native coronary artery without angina pectoris: Secondary | ICD-10-CM

## 2013-10-29 DIAGNOSIS — I739 Peripheral vascular disease, unspecified: Secondary | ICD-10-CM

## 2013-10-29 DIAGNOSIS — E785 Hyperlipidemia, unspecified: Secondary | ICD-10-CM

## 2013-10-29 DIAGNOSIS — I6521 Occlusion and stenosis of right carotid artery: Secondary | ICD-10-CM

## 2013-10-29 DIAGNOSIS — I6529 Occlusion and stenosis of unspecified carotid artery: Secondary | ICD-10-CM

## 2013-10-29 DIAGNOSIS — E1151 Type 2 diabetes mellitus with diabetic peripheral angiopathy without gangrene: Secondary | ICD-10-CM

## 2013-10-29 DIAGNOSIS — I798 Other disorders of arteries, arterioles and capillaries in diseases classified elsewhere: Secondary | ICD-10-CM

## 2013-10-29 NOTE — Progress Notes (Signed)
Patient ID: Rodney Love, male   DOB: [birth year], 68 y.o.   MRN: [id number]      Reason for office visit PAD, hyperlipidemia, hypertension, right carotid artery occlusion  [name] feels well. He continues to walk for 1-2 miles on a daily basis. He has no dyspnea, chest pain or intermittent claudication. He has to actually slowed down so that his wife can keep pace with him. He had some pain in his left hip that was diagnosed as bursitis and has improved. Dr. Shelia Media told him that his cholesterol numbers were "by the book" in his last hemoglobin A1c was 5.6%. He was also worried that he was with losing weight too fast, but [name]'s weight has remained steady at around 180 pounds for several months now.   His only complaint is that he gets dizzy every morning after taking his blood pressure medications.  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 He had a remote angioplasty of the left external iliac maybe as long as 20 years ago and has not had symptoms since. He denies any nonhealing wounds or other ischemic changes in the feet. 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 He has never had a neurological event.  He has numerous but well treated this factors for atherosclerosis including type 2 diabetes, hyperlipidemia and hypertension. He does not smoke and he exercises on a daily basis.    No Known Allergies  Current Outpatient Prescriptions  Medication Sig Dispense Refill  . amLODipine (NORVASC) 5 MG tablet Take 2.5 mg by mouth every morning. Takes 1/2 tablet      . aspirin 325 MG tablet Take 325 mg by mouth daily.      Marland Kitchen atorvastatin (LIPITOR) 20 MG tablet Take 0.5 tablets by mouth daily.      . benazepril-hydrochlorthiazide (LOTENSIN HCT) 20-25 MG per tablet Take 0.5 tablets by mouth every  morning.       . meloxicam (MOBIC) 15 MG tablet Take 15 mg by mouth as needed.       . metFORMIN (GLUCOPHAGE) 1000 MG tablet Take 1,000 mg by mouth 2 (two) times daily with a meal.      . Multiple Vitamin (MULTIVITAMIN) tablet Take 1 tablet by mouth daily.      . Omega-3 Fatty Acids (FISH OIL) 1200 MG CAPS Take 1,200 mg by mouth every evening.      . tamsulosin (FLOMAX) 0.4 MG CAPS capsule Take 1 capsule by mouth daily.       No current facility-administered medications for this visit.    Past Medical History  Diagnosis Date  . Diabetes mellitus without complication   . Hypertension   . Hyperlipidemia   . Environmental allergies   . Arthritis   . Constipation     OCCASIONAL  . Nocturia   . Inguinal hernia   . Coronary artery disease   . Peripheral arterial disease     Past Surgical History  Procedure Laterality Date  . Back surgery    . Cardiac catheterization  2008  . Inguinal hernia repair N/A 09/23/2012    Procedure: LAPAROSCOPIC INGUINAL HERNIA with umbilical hernia;  Surgeon: Madilyn Hook, DO;  Location: [facility];  Service: General;  Laterality: N/A;  . Insertion of mesh Bilateral 09/23/2012    Procedure: INSERTION OF MESH;  Surgeon: Madilyn Hook,  DO;  Location: [facility];  Service: General;  Laterality: Bilateral;  . Hernia repair  [id number]    Family History  Problem Relation Age of Onset  . Diabetes Mother   . Stroke Father   . Stroke Brother   . Cancer Sister   . Cancer Sister     History   Social History  . Marital Status: Married    Spouse Name: N/A    Number of Children: N/A  . Years of Education: N/A   Occupational History  . Not on file.   Social History Main Topics  . Smoking status: Former Research scientist (life sciences)  . Smokeless tobacco: Former Systems developer    Quit date: 08/06/1991  . Alcohol Use: No  . Drug Use: No  . Sexual Activity: Not on file   Other Topics Concern  . Not on file   Social History Narrative  . No narrative on file    Review of systems: The  patient specifically denies any chest pain at rest or with exertion, dyspnea at rest or with exertion, orthopnea, paroxysmal nocturnal dyspnea, syncope, palpitations, focal neurological deficits, intermittent claudication, lower extremity edema, unexplained weight gain, cough, hemoptysis or wheezing.  The patient also denies abdominal pain, nausea, vomiting, dysphagia, diarrhea, constipation, polyuria, polydipsia, dysuria, hematuria, frequency, urgency, abnormal bleeding or bruising, fever, chills, unexpected weight changes, mood swings, change in skin or hair texture, change in voice quality, auditory or visual problems, allergic reactions or rashes, new musculoskeletal complaints other than usual "aches and pains".   PHYSICAL EXAM BP 124/60  Pulse 62  Resp 16  Ht 6' (1.829 m)  Wt 180 lb 4.8 oz (81.784 kg)  BMI 24.45 kg/m2  General: Alert, oriented x3, no distress Head: no evidence of trauma, PERRL, EOMI, no exophtalmos or lid lag, no myxedema, no xanthelasma; normal ears, nose and oropharynx Neck: normal jugular venous pulsations and no hepatojugular reflux; brisk carotid pulses without delay and no carotid bruits Chest: clear to auscultation, no signs of consolidation by percussion or palpation, normal fremitus, symmetrical and full respiratory excursions Cardiovascular: normal position and quality of the apical impulse, regular rhythm, normal first and second heart sounds, no murmurs, rubs or gallops Abdomen: no tenderness or distention, no masses by palpation, no abnormal pulsatility or arterial bruits, normal bowel sounds, no hepatosplenomegaly Extremities: no clubbing, cyanosis or edema; 2+ radial, ulnar and brachial pulses bilaterally; 2+ right femoral, posterior tibial and dorsalis pedis pulses; 2+ left femoral, 1+ posterior tibial and absent dorsalis pedis pulses; no subclavian or femoral bruits Neurological: grossly nonfocal   EKG: NSR  Lipid Panel we'll get results from Dr.  Shelia Media No results found for this basename: chol, trig, hdl, cholhdl, vldl, ldlcalc    BMET    Component Value Date/Time   NA 138 09/18/2012 1220   K 4.4 09/18/2012 1220   CL 103 09/18/2012 1220   CO2 26 09/18/2012 1220   GLUCOSE 114* 09/18/2012 1220   BUN 11 09/18/2012 1220   CREATININE 0.94 09/18/2012 1220   CALCIUM 9.8 09/18/2012 1220   GFRNONAA 84* 09/18/2012 1220   GFRAA >90 09/18/2012 1220     ASSESSMENT AND PLAN  Despite extensive atherosclerotic disease with total occlusion of the right carotid artery and severe stenosis in the left superficial femoral artery, Rodney Love remains asymptomatic without neurological complaints or claudication. He also denies angina and dyspnea.  The focus is on risk factor modification and it sounds like all his metabolic risk factors are well addressed. He quit smoking more than 20  years ago.  It sounds like he is having hypotension in the morning after taking his blood pressure medication. His blood pressure is excellent. Will stop his low dose of amlodipine. I've asked him to keep a record of his home blood pressure and call me if the systolic exceeds 916 or diastolic exceeds 90 mm Hg.    Orders Placed This Encounter  Procedures  . EKG 12-Lead   No orders of the defined types were placed in this encounter.    Holli Humbles, MD, [place] [phone] office [phone] pager

## 2013-10-29 NOTE — Patient Instructions (Signed)
Your physician wants you to follow-up in: 1 year with Dr. Croitoru. You will receive a reminder letter in the mail two months in advance. If you don't receive a letter, please call our office to schedule the follow-up appointment.  

## 2014-05-05 DIAGNOSIS — E118 Type 2 diabetes mellitus with unspecified complications: Secondary | ICD-10-CM | POA: Diagnosis not present

## 2014-05-05 DIAGNOSIS — E119 Type 2 diabetes mellitus without complications: Secondary | ICD-10-CM | POA: Diagnosis not present

## 2014-05-05 DIAGNOSIS — Z7982 Long term (current) use of aspirin: Secondary | ICD-10-CM | POA: Diagnosis not present

## 2014-05-12 DIAGNOSIS — I2510 Atherosclerotic heart disease of native coronary artery without angina pectoris: Secondary | ICD-10-CM | POA: Diagnosis not present

## 2014-05-12 DIAGNOSIS — E119 Type 2 diabetes mellitus without complications: Secondary | ICD-10-CM | POA: Diagnosis not present

## 2014-05-18 DIAGNOSIS — E119 Type 2 diabetes mellitus without complications: Secondary | ICD-10-CM | POA: Diagnosis not present

## 2014-05-18 DIAGNOSIS — H2513 Age-related nuclear cataract, bilateral: Secondary | ICD-10-CM | POA: Diagnosis not present

## 2014-05-27 DIAGNOSIS — Z961 Presence of intraocular lens: Secondary | ICD-10-CM | POA: Diagnosis not present

## 2014-07-20 ENCOUNTER — Telehealth: Payer: Self-pay | Admitting: Cardiovascular Disease

## 2014-07-21 NOTE — Telephone Encounter (Signed)
Close encounter 

## 2014-08-12 DIAGNOSIS — J0410 Acute tracheitis without obstruction: Secondary | ICD-10-CM | POA: Diagnosis not present

## 2014-08-12 DIAGNOSIS — I10 Essential (primary) hypertension: Secondary | ICD-10-CM | POA: Diagnosis not present

## 2014-09-14 DIAGNOSIS — K5900 Constipation, unspecified: Secondary | ICD-10-CM | POA: Diagnosis not present

## 2014-10-26 ENCOUNTER — Encounter: Payer: Self-pay | Admitting: *Deleted

## 2014-11-01 DIAGNOSIS — E118 Type 2 diabetes mellitus with unspecified complications: Secondary | ICD-10-CM | POA: Diagnosis not present

## 2014-11-02 ENCOUNTER — Ambulatory Visit (INDEPENDENT_AMBULATORY_CARE_PROVIDER_SITE_OTHER): Payer: Medicare Other | Admitting: Cardiovascular Disease

## 2014-11-02 VITALS — BP 130/62 | HR 64 | Resp 16 | Ht 73.0 in | Wt 178.7 lb

## 2014-11-02 DIAGNOSIS — I6521 Occlusion and stenosis of right carotid artery: Secondary | ICD-10-CM

## 2014-11-02 DIAGNOSIS — E785 Hyperlipidemia, unspecified: Secondary | ICD-10-CM | POA: Diagnosis not present

## 2014-11-02 DIAGNOSIS — I10 Essential (primary) hypertension: Secondary | ICD-10-CM | POA: Diagnosis not present

## 2014-11-02 MED ORDER — BENAZEPRIL HCL 10 MG PO TABS: 10.0000 mg | ORAL_TABLET | Freq: Every day | ORAL | Status: DC

## 2014-11-02 NOTE — Progress Notes (Signed)
Patient ID: Rodney Love, male   DOB: 1945/07/15, 69 y.o.   MRN: [id number]     Cardiology Office Note   Date:  11/04/2014   ID:  Rodney Love, DOB 1945/05/07, MRN [id number]  PCP:  Rodney Pel, MD  Cardiologist:   Rodney Klein, MD   Chief Complaint  Patient presents with  . Follow-up    dizzy and light headed      History of Present Illness: Rodney Love is a 69 y.o. male who presents for PAD, hyperlipidemia, hypertension, right carotid artery occlusion follow up  [name] feels well generally. He has no dyspnea, chest pain or intermittent claudication. However, he has weakness every day after he takes his BP meds. He gets dizzy every morning after taking his blood pressure medications. His BP is usually 106-110 at home. He has developed resting tremor in his left hand.  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 He had a remote angioplasty of the left external iliac maybe as long as 20 years ago and has not had symptoms since. He denies any nonhealing wounds or other ischemic changes in the feet. 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 He has never had a neurological event.  He has numerous but well treated this factors for atherosclerosis including type 2 diabetes, hyperlipidemia and hypertension. He does not smoke and he exercises on a daily basis.  Labs 8/2 show HgbA1c 5.3%, cholesterol 120, HDL 46, LDL63, TG 53, creatinine 0.97, Hgb 14.6  Past Medical History  Diagnosis Date  . Diabetes mellitus without complication   . Hypertension   . Hyperlipidemia   . Environmental allergies   . Arthritis   . Constipation     OCCASIONAL  . Nocturia   . Inguinal hernia   . Coronary artery disease   . Peripheral arterial disease     Past Surgical History  Procedure  Laterality Date  . Back surgery    . Cardiac catheterization  05/29/2006    single vessel CAD w/moderate stenosis of the prox. RCA approx 40%,prox. LAD 20-30%,minor irreg. mid abdominal aorta,left iliac artery disease 50-60%  . Inguinal hernia repair N/A 09/23/2012    Procedure: LAPAROSCOPIC INGUINAL HERNIA with umbilical hernia;  Surgeon: Madilyn Hook, DO;  Location: [facility];  Service: General;  Laterality: N/A;  . Insertion of mesh Bilateral 09/23/2012    Procedure: INSERTION OF MESH;  Surgeon: Madilyn Hook, DO;  Location: [facility];  Service: General;  Laterality: Bilateral;  . Hernia repair  [id number]     Current Outpatient Prescriptions  Medication Sig Dispense Refill  . aspirin 325 MG tablet Take 325 mg by mouth daily.    Marland Kitchen atorvastatin (LIPITOR) 20 MG tablet Take 0.5 tablets by mouth daily.    . metFORMIN (GLUCOPHAGE) 1000 MG tablet Take 1,000 mg by mouth 2 (two) times daily with a meal.    . Multiple Vitamin (MULTIVITAMIN) tablet Take 1 tablet by mouth daily.    . Omega-3 Fatty Acids (FISH OIL) 1200 MG CAPS Take 1,200 mg by mouth every evening.    . tamsulosin (FLOMAX) 0.4 MG CAPS capsule Take 1 capsule by mouth daily.    . benazepril (LOTENSIN) 10 MG tablet Take 1 tablet (10 mg total) by mouth daily. 30 tablet 6   No current facility-administered medications for this visit.  Allergies:   Review of patient's allergies indicates no known allergies.    Social History:  The patient  reports that he has quit smoking. He quit smokeless tobacco use about 23 years ago. He reports that he does not drink alcohol or use illicit drugs.   Family History:  The patient's family history includes Cancer in his sister and sister; Diabetes in his mother; Kidney disease in his mother; Stroke in his brother and father.    ROS:  Please see the history of present illness.    Otherwise, review of systems positive for none.   All other systems are reviewed and negative.    PHYSICAL EXAM: VS:  BP  130/62 mmHg  Pulse 64  Resp 16  Ht 6\' 1"  (1.854 m)  Wt 178 lb 11.2 oz (81.058 kg)  BMI 23.58 kg/m2 , BMI Body mass index is 23.58 kg/(m^2).  General: Alert, oriented x3, no distress Head: no evidence of trauma, PERRL, EOMI, no exophtalmos or lid lag, no myxedema, no xanthelasma; normal ears, nose and oropharynx Neck: normal jugular venous pulsations and no hepatojugular reflux; brisk carotid pulses without delay and no carotid bruits Chest: clear to auscultation, no signs of consolidation by percussion or palpation, normal fremitus, symmetrical and full respiratory excursions Cardiovascular: normal position and quality of the apical impulse, regular rhythm, normal first and second heart sounds, no  murmurs, rubs or gallops Abdomen: no tenderness or distention, no masses by palpation, no abnormal pulsatility or arterial bruits, normal bowel sounds, no hepatosplenomegaly Extremities: no clubbing, cyanosis or edema; 2+ radial, ulnar and brachial pulses bilaterally; 2+ right femoral, posterior tibial and dorsalis pedis pulses; 2+ left femoral, posterior tibial and dorsalis pedis pulses; no subclavian or femoral bruits Neurological: grossly nonfocal Psych: euthymic mood, full affect   EKG:  EKG is ordered today. The ekg ordered today demonstrates NSR, normal tracing   Recent Labs: Labs 8/2 show HgbA1c 5.3%, cholesterol 120, HDL 46, LDL63, TG 53, creatinine 0.97, Hgb 14.6  Wt Readings from Last 3 Encounters:  11/02/14 178 lb 11.2 oz (81.058 kg)  10/29/13 180 lb 4.8 oz (81.784 kg)  06/22/13 183 lb (83.008 kg)      ASSESSMENT AND PLAN:  Despite extensive atherosclerotic disease with total occlusion of the right carotid artery and severe stenosis in the left superficial femoral artery, Rodney Love remains asymptomatic without focal neurological complaints or claudication.   All his metabolic risk factors are well addressed. He quit smoking more than 20 years ago.  It again sounds like he is  having hypotension in the morning after taking his blood pressure medication. His blood pressure is excellent. Will stop his diuretic.  Recheck carotid duplex every 2 years.  May be developing essential tremor or Parkinson's.  Current medicines are reviewed at length with the patient today.  The patient does not have concerns regarding medicines.   Labs/ tests ordered today include:  Orders Placed This Encounter  Procedures  . EKG 12-Lead    Patient Instructions  Your physician wants you to follow-up in: 1 Year You will receive a reminder letter in the mail two months in advance. If you don't receive a letter, please call our office to schedule the follow-up appointment.  Your physician has recommended you make the following change in your medication: STOP Benazepril/HCTZ and START Benazepril 10 mg daily  Your physician has requested that you have a carotid duplex. This test is an ultrasound of the carotid arteries in your neck. It looks at blood flow through  these arteries that supply the brain with blood. Allow one hour for this exam. There are no restrictions or special instructions. In March 2017         Mikael Spray, MD  11/04/2014 8:34 PM    Rodney Klein, MD, [facility] [phone] office [phone] pager

## 2014-11-02 NOTE — Patient Instructions (Addendum)
Your physician wants you to follow-up in: 1 Year You will receive a reminder letter in the mail two months in advance. If you don't receive a letter, please call our office to schedule the follow-up appointment.  Your physician has recommended you make the following change in your medication: STOP Benazepril/HCTZ and START Benazepril 10 mg daily  Your physician has requested that you have a carotid duplex. This test is an ultrasound of the carotid arteries in your neck. It looks at blood flow through these arteries that supply the brain with blood. Allow one hour for this exam. There are no restrictions or special instructions. In March 2017

## 2014-11-03 DIAGNOSIS — R5383 Other fatigue: Secondary | ICD-10-CM | POA: Diagnosis not present

## 2014-11-03 DIAGNOSIS — I10 Essential (primary) hypertension: Secondary | ICD-10-CM | POA: Diagnosis not present

## 2014-11-03 DIAGNOSIS — E559 Vitamin D deficiency, unspecified: Secondary | ICD-10-CM | POA: Diagnosis not present

## 2014-11-03 DIAGNOSIS — E119 Type 2 diabetes mellitus without complications: Secondary | ICD-10-CM | POA: Diagnosis not present

## 2014-11-03 DIAGNOSIS — E118 Type 2 diabetes mellitus with unspecified complications: Secondary | ICD-10-CM | POA: Diagnosis not present

## 2014-11-03 DIAGNOSIS — E785 Hyperlipidemia, unspecified: Secondary | ICD-10-CM | POA: Diagnosis not present

## 2014-11-04 ENCOUNTER — Encounter: Payer: Self-pay | Admitting: Cardiovascular Disease

## 2014-11-15 DIAGNOSIS — E119 Type 2 diabetes mellitus without complications: Secondary | ICD-10-CM | POA: Diagnosis not present

## 2014-11-15 DIAGNOSIS — H02055 Trichiasis without entropian left lower eyelid: Secondary | ICD-10-CM | POA: Diagnosis not present

## 2014-11-29 ENCOUNTER — Other Ambulatory Visit: Payer: Self-pay | Admitting: Cardiovascular Disease

## 2014-11-29 DIAGNOSIS — I739 Peripheral vascular disease, unspecified: Secondary | ICD-10-CM

## 2014-12-07 ENCOUNTER — Ambulatory Visit (HOSPITAL_COMMUNITY)
Admission: RE | Admit: 2014-12-07 | Discharge: 2014-12-07 | Disposition: A | Discharge status: Home / Self Care | Payer: Medicare Other | Source: Ambulatory Visit | Attending: Urology | Admitting: Urology

## 2014-12-07 DIAGNOSIS — E785 Hyperlipidemia, unspecified: Secondary | ICD-10-CM | POA: Diagnosis not present

## 2014-12-07 DIAGNOSIS — I739 Peripheral vascular disease, unspecified: Secondary | ICD-10-CM | POA: Diagnosis not present

## 2014-12-07 DIAGNOSIS — I2510 Atherosclerotic heart disease of native coronary artery without angina pectoris: Secondary | ICD-10-CM | POA: Diagnosis not present

## 2014-12-07 DIAGNOSIS — Z87891 Personal history of nicotine dependence: Secondary | ICD-10-CM | POA: Insufficient documentation

## 2014-12-07 DIAGNOSIS — I10 Essential (primary) hypertension: Secondary | ICD-10-CM | POA: Diagnosis not present

## 2014-12-07 DIAGNOSIS — I679 Cerebrovascular disease, unspecified: Secondary | ICD-10-CM | POA: Insufficient documentation

## 2014-12-12 ENCOUNTER — Encounter: Payer: Self-pay | Admitting: *Deleted

## 2014-12-14 ENCOUNTER — Encounter: Payer: Self-pay | Admitting: Cardiovascular Disease

## 2014-12-21 DIAGNOSIS — Z23 Encounter for immunization: Secondary | ICD-10-CM | POA: Diagnosis not present

## 2014-12-28 DIAGNOSIS — M5136 Other intervertebral disc degeneration, lumbar region: Secondary | ICD-10-CM | POA: Diagnosis not present

## 2014-12-28 DIAGNOSIS — R42 Dizziness and giddiness: Secondary | ICD-10-CM | POA: Diagnosis not present

## 2014-12-28 DIAGNOSIS — R0681 Apnea, not elsewhere classified: Secondary | ICD-10-CM | POA: Diagnosis not present

## 2014-12-28 DIAGNOSIS — M545 Low back pain: Secondary | ICD-10-CM | POA: Diagnosis not present

## 2014-12-28 DIAGNOSIS — M791 Myalgia: Secondary | ICD-10-CM | POA: Diagnosis not present

## 2015-01-10 DIAGNOSIS — M545 Low back pain: Secondary | ICD-10-CM | POA: Diagnosis not present

## 2015-01-10 DIAGNOSIS — N401 Enlarged prostate with lower urinary tract symptoms: Secondary | ICD-10-CM | POA: Diagnosis not present

## 2015-01-10 DIAGNOSIS — M159 Polyosteoarthritis, unspecified: Secondary | ICD-10-CM | POA: Diagnosis not present

## 2015-01-10 DIAGNOSIS — I10 Essential (primary) hypertension: Secondary | ICD-10-CM | POA: Diagnosis not present

## 2015-01-23 DIAGNOSIS — I10 Essential (primary) hypertension: Secondary | ICD-10-CM | POA: Diagnosis not present

## 2015-01-23 DIAGNOSIS — R42 Dizziness and giddiness: Secondary | ICD-10-CM | POA: Diagnosis not present

## 2015-01-23 DIAGNOSIS — G4730 Sleep apnea, unspecified: Secondary | ICD-10-CM | POA: Diagnosis not present

## 2015-03-03 DIAGNOSIS — E119 Type 2 diabetes mellitus without complications: Secondary | ICD-10-CM | POA: Diagnosis not present

## 2015-03-03 DIAGNOSIS — Z0000 Encounter for general adult medical examination without abnormal findings: Secondary | ICD-10-CM | POA: Diagnosis not present

## 2015-03-03 DIAGNOSIS — I10 Essential (primary) hypertension: Secondary | ICD-10-CM | POA: Diagnosis not present

## 2015-03-03 DIAGNOSIS — E785 Hyperlipidemia, unspecified: Secondary | ICD-10-CM | POA: Diagnosis not present

## 2015-03-09 DIAGNOSIS — N401 Enlarged prostate with lower urinary tract symptoms: Secondary | ICD-10-CM | POA: Diagnosis not present

## 2015-03-09 DIAGNOSIS — Z1212 Encounter for screening for malignant neoplasm of rectum: Secondary | ICD-10-CM | POA: Diagnosis not present

## 2015-03-09 DIAGNOSIS — E118 Type 2 diabetes mellitus with unspecified complications: Secondary | ICD-10-CM | POA: Diagnosis not present

## 2015-03-09 DIAGNOSIS — E785 Hyperlipidemia, unspecified: Secondary | ICD-10-CM | POA: Diagnosis not present

## 2015-03-09 DIAGNOSIS — I10 Essential (primary) hypertension: Secondary | ICD-10-CM | POA: Diagnosis not present

## 2015-04-20 DIAGNOSIS — E119 Type 2 diabetes mellitus without complications: Secondary | ICD-10-CM | POA: Diagnosis not present

## 2015-05-16 DIAGNOSIS — M545 Low back pain: Secondary | ICD-10-CM | POA: Diagnosis not present

## 2015-05-16 DIAGNOSIS — M5136 Other intervertebral disc degeneration, lumbar region: Secondary | ICD-10-CM | POA: Diagnosis not present

## 2015-05-18 DIAGNOSIS — M5136 Other intervertebral disc degeneration, lumbar region: Secondary | ICD-10-CM | POA: Diagnosis not present

## 2015-05-19 DIAGNOSIS — M5136 Other intervertebral disc degeneration, lumbar region: Secondary | ICD-10-CM | POA: Diagnosis not present

## 2015-05-25 ENCOUNTER — Other Ambulatory Visit: Payer: Self-pay | Admitting: Cardiovascular Disease

## 2015-05-25 NOTE — Telephone Encounter (Signed)
Rx(s) sent to pharmacy electronically.  

## 2015-05-29 DIAGNOSIS — E119 Type 2 diabetes mellitus without complications: Secondary | ICD-10-CM | POA: Diagnosis not present

## 2015-05-29 DIAGNOSIS — H2512 Age-related nuclear cataract, left eye: Secondary | ICD-10-CM | POA: Diagnosis not present

## 2015-06-05 DIAGNOSIS — E118 Type 2 diabetes mellitus with unspecified complications: Secondary | ICD-10-CM | POA: Diagnosis not present

## 2015-06-05 DIAGNOSIS — E119 Type 2 diabetes mellitus without complications: Secondary | ICD-10-CM | POA: Diagnosis not present

## 2015-06-08 DIAGNOSIS — E785 Hyperlipidemia, unspecified: Secondary | ICD-10-CM | POA: Diagnosis not present

## 2015-06-08 DIAGNOSIS — E119 Type 2 diabetes mellitus without complications: Secondary | ICD-10-CM | POA: Diagnosis not present

## 2015-06-08 DIAGNOSIS — I10 Essential (primary) hypertension: Secondary | ICD-10-CM | POA: Diagnosis not present

## 2015-06-26 ENCOUNTER — Ambulatory Visit (HOSPITAL_COMMUNITY)
Admission: RE | Admit: 2015-06-26 | Discharge: 2015-06-26 | Disposition: A | Discharge status: Home / Self Care | Payer: Medicare Other | Source: Ambulatory Visit | Attending: Cardiovascular Disease | Admitting: Cardiovascular Disease

## 2015-06-26 DIAGNOSIS — E785 Hyperlipidemia, unspecified: Secondary | ICD-10-CM | POA: Insufficient documentation

## 2015-06-26 DIAGNOSIS — I6523 Occlusion and stenosis of bilateral carotid arteries: Secondary | ICD-10-CM | POA: Insufficient documentation

## 2015-06-26 DIAGNOSIS — E119 Type 2 diabetes mellitus without complications: Secondary | ICD-10-CM | POA: Diagnosis not present

## 2015-06-26 DIAGNOSIS — I739 Peripheral vascular disease, unspecified: Secondary | ICD-10-CM | POA: Diagnosis not present

## 2015-06-26 DIAGNOSIS — I2510 Atherosclerotic heart disease of native coronary artery without angina pectoris: Secondary | ICD-10-CM | POA: Insufficient documentation

## 2015-06-26 DIAGNOSIS — I10 Essential (primary) hypertension: Secondary | ICD-10-CM | POA: Insufficient documentation

## 2015-06-26 DIAGNOSIS — I6521 Occlusion and stenosis of right carotid artery: Secondary | ICD-10-CM | POA: Diagnosis not present

## 2015-06-27 ENCOUNTER — Other Ambulatory Visit: Payer: Self-pay

## 2015-06-27 DIAGNOSIS — I6523 Occlusion and stenosis of bilateral carotid arteries: Secondary | ICD-10-CM

## 2015-07-25 DIAGNOSIS — I10 Essential (primary) hypertension: Secondary | ICD-10-CM | POA: Diagnosis not present

## 2015-07-25 DIAGNOSIS — M545 Low back pain: Secondary | ICD-10-CM | POA: Diagnosis not present

## 2015-11-20 DIAGNOSIS — E119 Type 2 diabetes mellitus without complications: Secondary | ICD-10-CM | POA: Diagnosis not present

## 2015-11-22 ENCOUNTER — Ambulatory Visit (INDEPENDENT_AMBULATORY_CARE_PROVIDER_SITE_OTHER): Payer: Medicare Other | Admitting: Cardiovascular Disease

## 2015-11-22 ENCOUNTER — Encounter: Payer: Self-pay | Admitting: Cardiovascular Disease

## 2015-11-22 VITALS — BP 118/68 | HR 72 | Ht 73.0 in | Wt 173.0 lb

## 2015-11-22 DIAGNOSIS — I10 Essential (primary) hypertension: Secondary | ICD-10-CM

## 2015-11-22 DIAGNOSIS — I6521 Occlusion and stenosis of right carotid artery: Secondary | ICD-10-CM | POA: Diagnosis not present

## 2015-11-22 DIAGNOSIS — I739 Peripheral vascular disease, unspecified: Secondary | ICD-10-CM

## 2015-11-22 DIAGNOSIS — E1151 Type 2 diabetes mellitus with diabetic peripheral angiopathy without gangrene: Secondary | ICD-10-CM

## 2015-11-22 DIAGNOSIS — R251 Tremor, unspecified: Secondary | ICD-10-CM

## 2015-11-22 DIAGNOSIS — I2510 Atherosclerotic heart disease of native coronary artery without angina pectoris: Secondary | ICD-10-CM

## 2015-11-22 DIAGNOSIS — E785 Hyperlipidemia, unspecified: Secondary | ICD-10-CM

## 2015-11-22 MED ORDER — PROPRANOLOL HCL 10 MG PO TABS: 10.0000 mg | ORAL_TABLET | Freq: Two times a day (BID) | ORAL | 11 refills | Status: DC

## 2015-11-22 NOTE — Progress Notes (Signed)
Cardiology Office Note    Date:  11/22/2015   ID:  Rodney Love, DOB December 30, 1945, MRN [id number]  PCP:  Horatio Pel, MD  Cardiologist:   Sanda Klein, MD   Chief Complaint  Patient presents with  . Follow-up    History of Present Illness:  Rodney Love is a 70 y.o. male with an extensive history of atherosclerotic disease in the coronary, lower extremity and carotid artery beds. He is here for routine follow-up and has not had any new cardiovascular problems. He notes worsening resting tremor (to see Dr. Carles Collet soon). He has occasional dizziness with changes in position. He has lost weight and his blood pressure is lower.  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 He had a remote angioplasty of the left external iliac maybe as long as 20 years ago and has not had symptoms since. He denies any nonhealing wounds or other ischemic changes in the feet. 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 He has never had a neurological event. Cardiac catheterization in 2008 showed 40% proximal RCA, 30% proximal LAD lesions. He has normal left ventricular systolic function. He has numerous but well treated this factors for atherosclerosis including type 2 diabetes, hyperlipidemia and hypertension. He does not smoke and he exercises on a daily basis.  Past Medical History:  Diagnosis Date  . Arthritis   . Constipation    OCCASIONAL  . Coronary artery disease   . Diabetes mellitus without complication ([place])   . Environmental allergies   . Hyperlipidemia   . Hypertension   . Inguinal hernia   . Nocturia   . Peripheral arterial disease [facility])     Past Surgical History:  Procedure Laterality Date  . BACK SURGERY    . CARDIAC CATHETERIZATION   05/29/2006   single vessel CAD w/moderate stenosis of the prox. RCA approx 40%,prox. LAD 20-30%,minor irreg. mid abdominal aorta,left iliac artery disease 50-60%  . HERNIA REPAIR  [id number]  . INGUINAL HERNIA REPAIR N/A 09/23/2012   Procedure: LAPAROSCOPIC INGUINAL HERNIA with umbilical hernia;  Surgeon: Madilyn Hook, DO;  Location: [facility];  Service: General;  Laterality: N/A;  . INSERTION OF MESH Bilateral 09/23/2012   Procedure: INSERTION OF MESH;  Surgeon: Madilyn Hook, DO;  Location: [facility];  Service: General;  Laterality: Bilateral;    Current Medications: Outpatient Medications Prior to Visit  Medication Sig Dispense Refill  . atorvastatin (LIPITOR) 20 MG tablet Take 0.5 tablets by mouth daily.    . Multiple Vitamin (MULTIVITAMIN) tablet Take 1 tablet by mouth daily.    . Omega-3 Fatty Acids (FISH OIL) 1200 MG CAPS Take 1,200 mg by mouth every evening.    . tamsulosin (FLOMAX) 0.4 MG CAPS capsule Take 1 capsule by mouth daily.    . benazepril (LOTENSIN) 10 MG tablet TAKE 1 TABLET (10 MG TOTAL) BY MOUTH DAILY. 30 tablet 7  . aspirin 325 MG tablet Take 325 mg by mouth daily.    . metFORMIN (GLUCOPHAGE) 1000 MG tablet Take 1,000 mg by mouth 2 (two) times daily with a meal.     No facility-administered medications prior to visit.      Allergies:   Review of patient's allergies indicates no known allergies.   Social History   Social History  .  Marital status: Married    Spouse name: N/A  . Number of children: N/A  . Years of education: N/A   Social History Main Topics  . Smoking status: Former Research scientist (life sciences)  . Smokeless tobacco: Former Systems developer    Quit date: 08/06/1991  . Alcohol use No  . Drug use: No  . Sexual activity: Not Asked   Other Topics Concern  . None   Social History Narrative  . None     Family History:  The patient's family history includes Cancer in his sister and sister; Diabetes in his mother; Kidney disease in his mother; Stroke in his brother and father.   ROS:     Please see the history of present illness.    ROS All other systems reviewed and are negative.   PHYSICAL EXAM:   VS:  BP 118/68 (BP Location: Right Arm, Patient Position: Sitting, Cuff Size: Normal)   Pulse 72   Ht 6\' 1"  (1.854 m)   Wt 173 lb (78.5 kg)   SpO2 98%   BMI 22.82 kg/m    GEN: Well nourished, well developed, in no acute distress  HEENT: normal  Neck: no JVD, carotid bruits, or masses Cardiac: RRR; no murmurs, rubs, or gallops,no edema  Respiratory:  clear to auscultation bilaterally, normal work of breathing GI: soft, nontender, nondistended, + BS MS: no deformity or atrophy  Skin: warm and dry, no rash Neuro:  Fine resting tremor, Alert and Oriented x 3, Strength and sensation are intact Psych: euthymic mood, full affect  Wt Readings from Last 3 Encounters:  11/22/15 173 lb (78.5 kg)  11/02/14 178 lb 11.2 oz (81.1 kg)  10/29/13 180 lb 4.8 oz (81.8 kg)      Studies/Labs Reviewed:   EKG:  EKG is ordered today.  The ekg ordered today demonstrates Normal sinus rhythm, normal tracing, QTC 435 ms  Recent Labs: Labs requested from PCP    ASSESSMENT:    1. Carotid occlusion, right   2. Atherosclerosis of native coronary artery of native heart without angina pectoris   3. PAD (peripheral artery disease) s/p remote left external iliac artery angioplasty   4. Essential hypertension   5. Tremor of both hands   6. Type 2 diabetes mellitus with diabetic peripheral angiopathy without gangrene, without long-term current use of insulin ([place])   7. Hyperlipidemia      PLAN:  In order of problems listed above:  1. Carotid stenosis: Known total occlusion of the right carotid and minimal plaque in the left carotid, unchanged on several serial yearly duplex ultrasound tests. Will increase to frequency to every other year. No focal neurological complaints other than tremor, no CVA/TIA. 2. CAD: Nonobstructive by remote cath, angina-free. 3. PAD: Despite significant  obstruction in the left iliac and femoral arteries, no symptoms of intermittent claudication. 4. HTN: Occasional dizziness on current dose of benazepril. He requests that the dose be reduced. Suspect lower blood pressures related to weight loss. Discussed the fact that his orthostatic hypotension may also be related to alpha blocker treatment. 5. Suspect essential tremor. We will try treatment with low-dose propranolol and stop the benazepril. 6. DM: No longer requires medications, also due to weight loss 7. HLP: Don't have his most recent lipid profile available for review. He is on statin therapy.    Medication Adjustments/Labs and Tests Ordered: Current medicines are reviewed at length with the patient today.  Concerns regarding medicines are outlined above.  Medication changes, Labs and Tests ordered today are listed  in the Patient Instructions below. Patient Instructions  Dr Sallyanne Kuster has recommended making the following medication changes: 1. STOP Benazepril 2. START Propranolol 10 mg - take 1 tablet by mouth twice daily  Dr Sallyanne Kuster recommends that you schedule a follow-up appointment in 1 year. You will receive a reminder letter in the mail two months in advance. If you don't receive a letter, please call our office to schedule the follow-up appointment.  If you need a refill on your cardiac medications before your next appointment, please call your pharmacy.    Signed, Sanda Klein, MD  11/22/2015 6:03 PM    [facility] [address], [place], [place]  [address] Phone: [phone]; Fax: [phone]

## 2015-11-22 NOTE — Patient Instructions (Signed)
Dr Sallyanne Kuster has recommended making the following medication changes: 1. STOP Benazepril 2. START Propranolol 10 mg - take 1 tablet by mouth twice daily  Dr Sallyanne Kuster recommends that you schedule a follow-up appointment in 1 year. You will receive a reminder letter in the mail two months in advance. If you don't receive a letter, please call our office to schedule the follow-up appointment.  If you need a refill on your cardiac medications before your next appointment, please call your pharmacy.

## 2015-11-27 DIAGNOSIS — B351 Tinea unguium: Secondary | ICD-10-CM | POA: Diagnosis not present

## 2015-11-27 DIAGNOSIS — E119 Type 2 diabetes mellitus without complications: Secondary | ICD-10-CM | POA: Diagnosis not present

## 2015-11-27 DIAGNOSIS — L84 Corns and callosities: Secondary | ICD-10-CM | POA: Diagnosis not present

## 2015-11-27 DIAGNOSIS — L602 Onychogryphosis: Secondary | ICD-10-CM | POA: Diagnosis not present

## 2015-11-27 DIAGNOSIS — M216X2 Other acquired deformities of left foot: Secondary | ICD-10-CM | POA: Diagnosis not present

## 2015-11-27 DIAGNOSIS — M216X1 Other acquired deformities of right foot: Secondary | ICD-10-CM | POA: Diagnosis not present

## 2015-11-29 ENCOUNTER — Other Ambulatory Visit: Payer: Self-pay | Admitting: *Deleted

## 2015-11-29 ENCOUNTER — Encounter: Payer: Self-pay | Admitting: Cardiovascular Disease

## 2015-11-29 MED ORDER — PROPRANOLOL HCL 10 MG PO TABS: 10.0000 mg | ORAL_TABLET | Freq: Two times a day (BID) | ORAL | 3 refills | Status: DC

## 2015-12-01 DIAGNOSIS — B351 Tinea unguium: Secondary | ICD-10-CM | POA: Diagnosis not present

## 2015-12-03 ENCOUNTER — Encounter: Payer: Self-pay | Admitting: Cardiovascular Disease

## 2015-12-07 DIAGNOSIS — I10 Essential (primary) hypertension: Secondary | ICD-10-CM | POA: Diagnosis not present

## 2015-12-07 DIAGNOSIS — E119 Type 2 diabetes mellitus without complications: Secondary | ICD-10-CM | POA: Diagnosis not present

## 2015-12-07 DIAGNOSIS — E785 Hyperlipidemia, unspecified: Secondary | ICD-10-CM | POA: Diagnosis not present

## 2015-12-08 ENCOUNTER — Telehealth: Payer: Self-pay

## 2015-12-08 NOTE — Telephone Encounter (Signed)
-----   Message from Sanda Klein, MD sent at 11/29/2015 12:51 PM EDT ----- Could you please check with his daughter [name] about the propranolol prescription. For some reason she says it's only been filled 3 days at a time. Okay to give him 90 day supply with 3 refills. Thanks

## 2015-12-08 NOTE — Telephone Encounter (Signed)
Propranolol refilled. See email from 11/29/15.

## 2015-12-12 DIAGNOSIS — E118 Type 2 diabetes mellitus with unspecified complications: Secondary | ICD-10-CM | POA: Diagnosis not present

## 2015-12-12 DIAGNOSIS — I10 Essential (primary) hypertension: Secondary | ICD-10-CM | POA: Diagnosis not present

## 2015-12-12 DIAGNOSIS — E785 Hyperlipidemia, unspecified: Secondary | ICD-10-CM | POA: Diagnosis not present

## 2015-12-14 NOTE — Progress Notes (Signed)
Rodney Love was seen today in the movement disorders clinic for neurologic consultation at the request of Horatio Pel, MD.  The consultation is for the evaluation of tremor.  The records that were made available to me were reviewed. This patient is accompanied in the office by his daughter who supplements the history.  The records that were made available to me were reviewed.  Cardiology notes do indicate resting tremor.  Cardiology thought that it was likely ET and propranolol was started on 11/22/15.  He was given 10 mg bid but states that he couldn't handle that due to lightheaded/dizzy and went to 5 mg bid.  He thinks that it helps some.   Tremor: Yes.     How long has it been going on? 2 years  At rest or with activation?  Rest and some with eating; can look at it and make it stop  When is it noted the most?  Always present during the day  Fam hx of tremor?  No.  Located where?  L arm, L leg may feel heavy but doesn't tremor.  He is R hand dominant  Affected by caffeine:  No. (occasional diet pepsi)  Affected by alcohol:  Doesn't drink EtOH  Affected by stress:  No.  Affected by fatigue:  No.   Other sx's: Voice: much more soft spoken x 6 years Sleep: sleeps well  Vivid Dreams:  No.  Acting out dreams:  No. Wet Pillows: No. Postural symptoms:  Yes.   x 1 year  Falls?  Yes.  , last fall 1.5 years ago - was walking backward and tripped over a rug Bradykinesia symptoms: shuffling gait, slow movements, difficulty getting out of a chair and difficulty regaining balance Loss of smell:  No. Loss of taste:  No. Urinary Incontinence:  No. Difficulty Swallowing:  No. Handwriting, micrographia: No. Trouble with ADL's:  Yes.   (just slower)  Trouble buttoning clothing: No. Depression:  No. Memory changes:  No.; lives with wife in one story home; wife not in good health physically but is good mentally Hallucinations:  No.  visual distortions: No. N/V:  No. Lightheaded:   No.  Syncope: No. Diplopia:  Yes.   (x 1 year) - told it was caused by cataracts but has to wait 6 months to have done Dyskinesia:  No.  Neuroimaging has not previously been performed.      ALLERGIES:  No Known Allergies  CURRENT MEDICATIONS:  Outpatient Encounter Prescriptions as of 12/18/2015  Medication Sig  . aspirin EC 81 MG tablet Take 81 mg by mouth daily.  Marland Kitchen atorvastatin (LIPITOR) 20 MG tablet Take 0.5 tablets by mouth daily.  . Multiple Vitamin (MULTIVITAMIN) tablet Take 1 tablet by mouth daily.  . Omega-3 Fatty Acids (FISH OIL) 1200 MG CAPS Take 1,200 mg by mouth every evening.  . propranolol (INDERAL) 10 MG tablet Take 1 tablet (10 mg total) by mouth 2 (two) times daily.  . tamsulosin (FLOMAX) 0.4 MG CAPS capsule Take 1 capsule by mouth daily.   No facility-administered encounter medications on file as of 12/18/2015.     PAST MEDICAL HISTORY:   Past Medical History:  Diagnosis Date  . Arthritis   . Constipation    OCCASIONAL  . Coronary artery disease   . Diabetes mellitus without complication ([place])   . Environmental allergies   . Hyperlipidemia   . Hypertension   . Inguinal hernia   . Nocturia   . Peripheral arterial disease ([place])  PAST SURGICAL HISTORY:   Past Surgical History:  Procedure Laterality Date  . BACK SURGERY    . CARDIAC CATHETERIZATION  05/29/2006   single vessel CAD w/moderate stenosis of the prox. RCA approx 40%,prox. LAD 20-30%,minor irreg. mid abdominal aorta,left iliac artery disease 50-60%  . CATARACT EXTRACTION Right   . HERNIA REPAIR  [id number]  . INGUINAL HERNIA REPAIR N/A 09/23/2012   Procedure: LAPAROSCOPIC INGUINAL HERNIA with umbilical hernia;  Surgeon: Madilyn Hook, DO;  Location: [facility];  Service: General;  Laterality: N/A;  . INSERTION OF MESH Bilateral 09/23/2012   Procedure: INSERTION OF MESH;  Surgeon: Madilyn Hook, DO;  Location: [facility];  Service: General;  Laterality: Bilateral;    SOCIAL HISTORY:   Social History    Social History  . Marital status: Married    Spouse name: N/A  . Number of children: N/A  . Years of education: N/A   Occupational History  . retired     Architect   Social History Main Topics  . Smoking status: Former Smoker    Quit date: 04/02/1991  . Smokeless tobacco: Former Systems developer    Quit date: 08/06/1991  . Alcohol use No  . Drug use: No  . Sexual activity: Not on file   Other Topics Concern  . Not on file   Social History Narrative  . No narrative on file    FAMILY HISTORY:   Family Status  Relation Status  . Mother Deceased  . Father Deceased  . Brother Deceased  . Sister Deceased  . Sister Deceased  . Sister Alive  . Daughter Alive  . Daughter Alive  . Son Alive    ROS:  A complete 10 system review of systems was obtained and was unremarkable apart from what is mentioned above.  PHYSICAL EXAMINATION:    VITALS:   Vitals:   12/18/15 0905  BP: (!) 154/80  Pulse: (!) 56  SpO2: 98%  Weight: 175 lb (79.4 kg)  Height: 6\' 1"  (1.854 m)    GEN:  The patient appears stated age and is in NAD. HEENT:  Normocephalic, atraumatic.  The mucous membranes are moist. The superficial temporal arteries are without ropiness or tenderness. CV:  RRR Lungs:  CTAB Neck/HEME:  There are no carotid bruits bilaterally.  Neurological examination:  Orientation: The patient is alert and oriented x3. Fund of knowledge is appropriate.  Recent and remote memory are intact.  Attention and concentration are normal.    Able to name objects and repeat phrases. Cranial nerves: There is good facial symmetry.   There is significant facial hypomima with decreased blink.  Pupils are equal round and reactive to light bilaterally. Fundoscopic exam is attempted but the disc margins are not well visualized bilaterally.  Extraocular muscles are intact. The visual fields are full to confrontational testing. The speech is fluent and clear. Soft palate rises symmetrically and there is no tongue  deviation. Hearing is intact to conversational tone. Sensation: Sensation is intact to light and pinprick throughout (facial, trunk, extremities). Vibration is decreased at the bilateral big toe. There is no extinction with double simultaneous stimulation. There is no sensory dermatomal level identified. Motor: Strength is 5/5 in the bilateral upper and lower extremities.   Shoulder shrug is equal and symmetric.  There is no pronator drift. Deep tendon reflexes: Deep tendon reflexes are 3/4 at the bilateral biceps, triceps, brachioradialis, patella and achilles.  There are a few beats of ankle clonus on the right.   Plantar responses are  downgoing bilaterally.  Movement examination: Tone: There is increased tone in the bilateral upper extremities, L more than right.  The L is mod and right is mild-mod.  The tone in the LLE is mod increased and tone in the RLE is normal Abnormal movements: There is a LUE resting tremor that slightly increases with distraction.   Coordination:  There is  decremation with RAM's, with hand opening and closing and foot taps on the L.  All other RAMS are normal bilaterally.   Gait and Station: The patient has no difficulty arising out of a deep-seated chair without the use of the hands. The patient's stride length is slightly decreased but he has preserved arm swing bilaterally.  The patient has a positive pull test.      ASSESSMENT/PLAN:  1.  Parkinsonism.  I suspect that this does represent idiopathic Parkinson's disease.  The patient has tremor, bradykinesia, rigidity and mild postural instability.  -We discussed the diagnosis as well as pathophysiology of the disease.  We discussed treatment options as well as prognostic indicators.  Patient education was provided.  -Greater than 50% of the 70 minute visit was spent in counseling answering questions and talking about what to expect now as well as in the future.  We talked about medication options as well as potential  future surgical options.  We talked about safety in the home.  -We decided to add carbidopa/levodopa 25/100.  1/2 tab tid x 1 wk, then 1/2 in am & noon & 1 at night for a week, then 1/2 in am &1 at noon &night for a week, then 1 po tid.  Risks, benefits, side effects and alternative therapies were discussed.  The opportunity to ask questions was given and they were answered to the best of my ability.  The patient expressed understanding and willingness to follow the outlined treatment protocols.  -I will refer the patient to the Parkinson's program [facility], for PT/OT and ST.  We talked about the importance of safe, cardiovascular exercise in Parkinson's disease.  -We discussed community resources in the area including patient support groups and community exercise programs for PD and pt education was provided to the patient.  -pt states that propranolol just being used for tremor.  If that is the case, he can d/c but will ask cardiologist first since BP still on high end.    2.  Hyperreflexia  -MRI brain without.  He does have low back issues but hyperflexia in arms as well and want to make sure that not missing anything.  3.  F/u in 8 weeks sooner should new neuro issues arise   Cc:  Horatio Pel, MD

## 2015-12-18 ENCOUNTER — Ambulatory Visit (INDEPENDENT_AMBULATORY_CARE_PROVIDER_SITE_OTHER): Payer: Medicare Other | Admitting: Neurology

## 2015-12-18 ENCOUNTER — Encounter: Payer: Self-pay | Admitting: Neurology

## 2015-12-18 VITALS — BP 154/80 | HR 56 | Ht 73.0 in | Wt 175.0 lb

## 2015-12-18 DIAGNOSIS — R292 Abnormal reflex: Secondary | ICD-10-CM

## 2015-12-18 DIAGNOSIS — G20 Parkinson's disease: Secondary | ICD-10-CM | POA: Diagnosis not present

## 2015-12-18 MED ORDER — CARBIDOPA-LEVODOPA 25-100 MG PO TABS: 1.0000 | ORAL_TABLET | Freq: Three times a day (TID) | ORAL | 2 refills | Status: DC

## 2015-12-18 NOTE — Patient Instructions (Signed)
1. We have sent a referral to [facility] for your MRI and they will call you directly to schedule your appt. They are located at [place]. If you need to contact them directly please call [phone].  2. You have been referred to [facility]. They will call you directly to schedule an appointment.  Please call [phone] if you do not hear from them.   3. Start Carbidopa Levodopa as follows:  Take 1/2 tablet three times daily, at least 30 minutes before meals, for one week  Then take 1/2 tablet in the morning, 1/2 tablet in the afternoon, 1 tablet in the evening, at least 30 minutes before meals, for one week  Then take 1/2 tablet in the morning, 1 tablet in the afternoon, 1 tablet in the evening, at least 30 minutes before meals, for one week  Then take 1 tablet three times daily, at least 30 minutes before meals

## 2015-12-18 NOTE — Progress Notes (Signed)
Thank you for letting me know. He can switch back to benazepril 10 mg daily, on which he felt better. I'll ask our nursing staff to call him. Thanks again, [employer]

## 2015-12-19 DIAGNOSIS — E119 Type 2 diabetes mellitus without complications: Secondary | ICD-10-CM | POA: Diagnosis not present

## 2015-12-19 DIAGNOSIS — L602 Onychogryphosis: Secondary | ICD-10-CM | POA: Diagnosis not present

## 2015-12-19 DIAGNOSIS — L03031 Cellulitis of right toe: Secondary | ICD-10-CM | POA: Diagnosis not present

## 2015-12-25 ENCOUNTER — Encounter: Payer: Self-pay | Admitting: Neurology

## 2015-12-25 ENCOUNTER — Other Ambulatory Visit: Payer: Self-pay | Admitting: Neurology

## 2015-12-25 ENCOUNTER — Ambulatory Visit
Admission: RE | Admit: 2015-12-25 | Discharge: 2015-12-25 | Disposition: A | Discharge status: Home / Self Care | Payer: Medicare Other | Source: Ambulatory Visit | Attending: Neurology | Admitting: Neurology

## 2015-12-25 DIAGNOSIS — R251 Tremor, unspecified: Secondary | ICD-10-CM | POA: Diagnosis not present

## 2015-12-25 DIAGNOSIS — H0553 Retained (old) foreign body following penetrating wound of bilateral orbits: Secondary | ICD-10-CM

## 2015-12-25 DIAGNOSIS — Z01818 Encounter for other preprocedural examination: Secondary | ICD-10-CM | POA: Diagnosis not present

## 2015-12-25 DIAGNOSIS — Z189 Retained foreign body fragments, unspecified material: Secondary | ICD-10-CM

## 2015-12-25 DIAGNOSIS — G20 Parkinson's disease: Secondary | ICD-10-CM

## 2015-12-26 ENCOUNTER — Telehealth: Payer: Self-pay | Admitting: Neurology

## 2015-12-26 ENCOUNTER — Encounter: Payer: Self-pay | Admitting: Cardiovascular Disease

## 2015-12-26 NOTE — Telephone Encounter (Signed)
Left message on machine for patient to call back.

## 2015-12-26 NOTE — Telephone Encounter (Signed)
Patient made aware of results.  

## 2015-12-26 NOTE — Telephone Encounter (Signed)
-----   Message from [place], DO sent at 12/26/2015  9:10 AM EDT ----- Let pt know that MRI demonstrated nothing acute.  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  Demonstrated right carotid occlusion, which was apparently known finding per cardiology notes and they have been following with carotid u/s, last done in March

## 2015-12-27 ENCOUNTER — Encounter: Payer: Self-pay | Admitting: Neurology

## 2016-01-05 ENCOUNTER — Encounter: Payer: Self-pay | Admitting: Cardiovascular Disease

## 2016-01-22 ENCOUNTER — Encounter: Payer: Medicare Other | Admitting: Occupational Therapy

## 2016-01-22 ENCOUNTER — Ambulatory Visit: Payer: Medicare Other

## 2016-01-22 ENCOUNTER — Ambulatory Visit: Payer: Medicare Other | Admitting: Physical Therapy

## 2016-01-26 ENCOUNTER — Encounter: Payer: Self-pay | Admitting: Cardiovascular Disease

## 2016-01-30 ENCOUNTER — Ambulatory Visit: Payer: Medicare Other | Attending: Neurology | Admitting: Speech Pathology

## 2016-01-30 ENCOUNTER — Ambulatory Visit: Payer: Medicare Other | Admitting: Physical Therapy

## 2016-01-30 ENCOUNTER — Ambulatory Visit: Payer: Medicare Other | Admitting: Occupational Therapy

## 2016-01-30 DIAGNOSIS — M25512 Pain in left shoulder: Secondary | ICD-10-CM | POA: Insufficient documentation

## 2016-01-30 DIAGNOSIS — R2681 Unsteadiness on feet: Secondary | ICD-10-CM

## 2016-01-30 DIAGNOSIS — R293 Abnormal posture: Secondary | ICD-10-CM | POA: Insufficient documentation

## 2016-01-30 DIAGNOSIS — R29818 Other symptoms and signs involving the nervous system: Secondary | ICD-10-CM | POA: Insufficient documentation

## 2016-01-30 DIAGNOSIS — R29898 Other symptoms and signs involving the musculoskeletal system: Secondary | ICD-10-CM

## 2016-01-30 DIAGNOSIS — M25612 Stiffness of left shoulder, not elsewhere classified: Secondary | ICD-10-CM

## 2016-01-30 DIAGNOSIS — R2689 Other abnormalities of gait and mobility: Secondary | ICD-10-CM

## 2016-01-30 DIAGNOSIS — R278 Other lack of coordination: Secondary | ICD-10-CM

## 2016-01-30 DIAGNOSIS — R471 Dysarthria and anarthria: Secondary | ICD-10-CM | POA: Diagnosis not present

## 2016-01-30 NOTE — Therapy (Signed)
[facility] [address] [place], [place], [address] Phone: [phone]   Fax:  [phone]  Physical Therapy Evaluation  Patient Details  Name: Rodney Love MRN: [id number] Date of Birth: [birth year] Referring Provider: Wells Guiles Tat  Encounter Date: 01/30/2016      PT End of Session - 01/30/16 1412    Visit Number 1   Number of Visits 17   Date for PT Re-Evaluation 03/30/16   Authorization Type [employer]-GCODE every 10th visit   PT Start Time 1317   PT Stop Time 1402   PT Time Calculation (min) 45 min   Activity Tolerance Patient tolerated treatment well   Behavior During Therapy [facility] for tasks assessed/performed      Past Medical History:  Diagnosis Date  . Arthritis   . Constipation    OCCASIONAL  . Coronary artery disease   . Diabetes mellitus without complication ([place])   . Environmental allergies   . Hyperlipidemia   . Hypertension   . Inguinal hernia   . Nocturia   . Peripheral arterial disease [facility])     Past Surgical History:  Procedure Laterality Date  . BACK SURGERY    . CARDIAC CATHETERIZATION  05/29/2006   single vessel CAD w/moderate stenosis of the prox. RCA approx 40%,prox. LAD 20-30%,minor irreg. mid abdominal aorta,left iliac artery disease 50-60%  . CATARACT EXTRACTION Right   . HERNIA REPAIR  [id number]  . INGUINAL HERNIA REPAIR N/A 09/23/2012   Procedure: LAPAROSCOPIC INGUINAL HERNIA with umbilical hernia;  Surgeon: Madilyn Hook, DO;  Location: [facility];  Service: General;  Laterality: N/A;  . INSERTION OF MESH Bilateral 09/23/2012   Procedure: INSERTION OF MESH;  Surgeon: Madilyn Hook, DO;  Location: [facility];  Service: General;  Laterality: Bilateral;    There were no vitals filed for this visit.       Subjective Assessment - 01/30/16 1316    Subjective Pt reports having limited movement with L shoulder. Feels balance is pretty good.  Have not had any falls in the past 6 months.   Pt feels walking has slowed.  Saw Dr. Carles Collet in September, noting tremor in left arm is better than it was.   Patient is accompained by: Family member  wife, daughter   Pertinent History Ruptured disc in low back   Patient Stated Goals Pt's goals for therapy is to improve walking and balance.   Currently in Pain? No/denies            [facility] PT Assessment - 01/30/16 1321      Assessment   Medical Diagnosis Parkinson's disease   Referring Provider Wells Guiles Tat   Onset Date/Surgical Date 12/18/15     Precautions   Precautions Fall     Balance Screen   Has the patient fallen in the past 6 months No   Has the patient had a decrease in activity level because of a fear of falling?  No   Is the patient reluctant to leave their home because of a fear of falling?  No     Home Ecologist residence   Living Arrangements Spouse/significant other  Daughter nearby   Available Help at Discharge Family   Type of [place] Access --  One step into home   [place] One level   [place] None     Prior Function   Level of Independence Independent   Vocation Retired   Leisure Watching TV,  likes to walk; likes to fish in pond, but hasn't done due to shoulder, back limitations     Tone   Assessment Location Other (comment)  Rigidity noted P/ROM LLE     ROM / Strength   AROM / PROM / Strength AROM;Strength     Strength   Overall Strength Comments Grossly tested at least 4/5 hip flexion, knee flexion, ankle dorsiflexion; quads 3+/5     Transfers   Transfers Sit to Stand;Stand to Sit   Sit to Stand 6: Modified independent (Device/Increase time);Without upper extremity assist;From chair/3-in-1   Five time sit to stand comments  17.98   Stand to Sit 6: Modified independent (Device/Increase time);Without upper extremity assist;To chair/3-in-1   Comments Reports increased difficulty getting up from recliner, in and out of car; difficulty with bed  mobility.     Ambulation/Gait   Gait velocity 11.40 sec = 2.88 ft/sec     Standardized Balance Assessment   Standardized Balance Assessment Timed Up and Go Test     Timed Up and Go Test   Normal TUG (seconds) 16.48   Manual TUG (seconds) 14.29   Cognitive TUG (seconds) 17.18   TUG Comments TUG scores >13.5 sec indicates increased fall risk; >15 sec with TUG cog indicates increased fall risk.     High Level Balance   High Level Balance Comments MiniBESTest:  21/28 (Difficulty with rising on toes, single limb stance, eyes closed on foam)                   [facility] Adult PT Treatment/Exercise - 01/30/16 1321      Ambulation/Gait   Ambulation/Gait Yes   Ambulation Distance (Feet) 200 Feet   Assistive device None   Gait Pattern Step-through pattern;Decreased arm swing - right;Decreased arm swing - left;Decreased step length - left;Narrow base of support  forward flexed posture   Ambulation Surface Level;Indoor     Posture/Postural Control   Posture/Postural Control Postural limitations   Postural Limitations Rounded Shoulders;Forward head;Posterior pelvic tilt      Therapeutic Ex:  Seated hamstring stretch, 2 x 30 seconds each leg, with attention paid to upright posture and positioning.  Added to HEP.    Mini-BESTest: Balance Evaluation Systems Test  2005-2013 [place]. All rights reserved. ________________________________________________________________________________________Anticipatory_________Subscore___3_/6 1. SIT TO STAND Instruction: "Cross your arms across your chest. Try not to use your hands unless you must.Do not let your legs lean against the back of the chair when you stand. Please stand up now." X(2) Normal: Comes to stand without use of hands and stabilizes independently. (1) Moderate: Comes to stand WITH use of hands on first attempt. (0) Severe: Unable to stand up from chair without assistance, OR needs several attempts with  use of hands. 2. RISE TO TOES Instruction: "Place your feet shoulder width apart. Place your hands on your hips. Try to rise as high as you can onto your toes. I will count out loud to 3 seconds. Try to hold this pose for at least 3 seconds. Look straight ahead. Rise now." (2) Normal: Stable for 3 s with maximum height. (1) Moderate: Heels up, but not full range (smaller than when holding hands), OR noticeable instability for 3 s. X(0) Severe: < 3 s. 3. STAND ON ONE LEG Instruction: "Look straight ahead. Keep your hands on your hips. Lift your leg off of the ground behind you without touching or resting your raised leg upon your other standing leg. Stay standing on one leg  as long as you can. Look straight ahead. Lift now." Left: Time in Seconds Trial 1:__4.23 sec___Trial 2:__<1 sec___ (2) Normal: 20 s. X(1) Moderate: < 20 s. (0) Severe: Unable. Right: Time in Seconds Trial 1:__< 1 sec___Trial 2:_1.53 sec____ (2) Normal: 20 s. X(1) Moderate: < 20 s. (0) Severe: Unable To score each side separately use the trial with the longest time. To calculate the sub-score and total score use the side [left or right] with the lowest numerical score [i.e. the worse side]. ______________________________________________________________________________________Reactive Postural Control___________Subscore:___6__/6 4. COMPENSATORY STEPPING CORRECTION- FORWARD Instruction: "Stand with your feet shoulder width apart, arms at your sides. Lean forward against my hands beyond your forward limits. When I let go, do whatever is necessary, including taking a step, to avoid a fall." X(2) Normal: Recovers independently with a single, large step (second realignment step is allowed). (1) Moderate: More than one step used to recover equilibrium. (0) Severe: No step, OR would fall if not caught, OR falls spontaneously. 5. COMPENSATORY STEPPING CORRECTION- BACKWARD Instruction: "Stand with your feet shoulder width apart,  arms at your sides. Lean backward against my hands beyond your backward limits. When I let go, do whatever is necessary, including taking a step, to avoid a fall." X(2) Normal: Recovers independently with a single, large step. (1) Moderate: More than one step used to recover equilibrium. (0) Severe: No step, OR would fall if not caught, OR falls spontaneously. 6. COMPENSATORY STEPPING CORRECTION- LATERAL Instruction: "Stand with your feet together, arms down at your sides. Lean into my hand beyond your sideways limit. When I let go, do whatever is necessary, including taking a step, to avoid a fall." Left X(2) Normal: Recovers independently with 1 step (crossover or lateral OK). (1) Moderate: Several steps to recover equilibrium. (0) Severe: Falls, or cannot step. Right X(2) Normal: Recovers independently with 1 step (crossover or lateral OK). (1) Moderate: Several steps to recover equilibrium. (0) Severe: Falls, or cannot step. Use the side with the lowest score to calculate sub-score and total score. ____________________________________________________________________________________Sensory Orientation_____________Subscore:_____5___/6 7. STANCE (FEET TOGETHER); EYES OPEN, FIRM SURFACE Instruction: "Place your hands on your hips. Place your feet together until almost touching. Look straight ahead. Be as stable and still as possible, until I say stop." Time in seconds:________ X(2) Normal: 30 s. (1) Moderate: < 30 s. (0) Severe: Unable. 8. STANCE (FEET TOGETHER); EYES CLOSED, FOAM SURFACE Instruction: "Step onto the foam. Place your hands on your hips. Place your feet together until almost touching. Be as stable and still as possible, until I say stop. I will start timing when you close your eyes." Time in seconds:________ (2) Normal: 30 s. X(1) Moderate: < 30 s. (0) Severe: Unable. 9. INCLINE- EYES CLOSED Instruction: "Step onto the incline ramp. Please stand on the incline ramp  with your toes toward the top. Place your feet shoulder width apart and have your arms down at your sides. I will start timing when you close your eyes." Time in seconds:________ X(2) Normal: Stands independently 30 s and aligns with gravity. (1) Moderate: Stands independently <30 s OR aligns with surface. (0) Severe: Unable. _________________________________________________________________________________________Dynamic Gait ______Subscore___7_____/10 10. CHANGE IN GAIT SPEED Instruction: "Begin walking at your normal speed, when I tell you 'fast', walk as fast as you can. When I say 'slow', walk very slowly." X(2) Normal: Significantly changes walking speed without imbalance. (1) Moderate: Unable to change walking speed or signs of imbalance. (0) Severe: Unable to achieve significant change in walking speed AND signs of imbalance. 11.  WALK WITH HEAD TURNS - HORIZONTAL Instruction: "Begin walking at your normal speed, when I say "right", turn your head and look to the right. When I say "left" turn your head and look to the left. Try to keep yourself walking in a straight line." (2) Normal: performs head turns with no change in gait speed and good balance. X(1) Moderate: performs head turns with reduction in gait speed. (0) Severe: performs head turns with imbalance. 12. WALK WITH PIVOT TURNS Instruction: "Begin walking at your normal speed. When I tell you to 'turn and stop', turn as quickly as you can, face the opposite direction, and stop. After the turn, your feet should be close together." (2) Normal: Turns with feet close FAST (< 3 steps) with good balance. X(1) Moderate: Turns with feet close SLOW (>4 steps) with good balance. (0) Severe: Cannot turn with feet close at any speed without imbalance. 13. STEP OVER OBSTACLES Instruction: "Begin walking at your normal speed. When you get to the box, step over it, not around it and keep walking." (2) Normal: Able to step over box with  minimal change of gait speed and with good balance. X(1) Moderate: Steps over box but touches box OR displays cautious behavior by slowing gait. (0) Severe: Unable to step over box OR steps around box. 14. TIMED UP & GO WITH DUAL TASK [3 METER WALK] Instruction TUG: "When I say 'Go', stand up from chair, walk at your normal speed across the tape on the floor, turn around, and come back to sit in the chair." Instruction TUG with Dual Task: "Count backwards by threes starting at ___. When I say 'Go', stand up from chair, walk at your normal speed across the tape on the floor, turn around, and come back to sit in the chair. Continue counting backwards the entire time." TUG: ________seconds; Dual Task TUG: ________seconds X(2) Normal: No noticeable change in sitting, standing or walking while backward counting when compared to TUG without Dual Task. (1) Moderate: Dual Task affects either counting OR walking (>10%) when compared to the TUG without Dual Task. (0) Severe: Stops counting while walking OR stops walking while counting. When scoring item 14, if subject's gait speed slows more than 10% between the TUG without and with a Dual Task the score should be decreased by a point. TOTAL SCORE: _____21___/28        PT Education - 01/30/16 1411    Education provided Yes   Education Details POC to address PD-related deficits; initiated HEP-hamstring stretching-see instructions   Person(s) Educated Patient;Spouse   Methods Explanation;Demonstration;Handout   Comprehension Verbalized understanding;Returned demonstration          PT Short Term Goals - 01/30/16 1416      PT SHORT TERM GOAL #1   Title Pt will be independent with HEP for improved posture, transfers, balance and gait.  TARGET 02/19/16   Time 4   Period Weeks   Status New     PT SHORT TERM GOAL #2   Title Pt will improve 5x sit<>stand transfers to less than or equal to 15 seconds for improved efficiency and safety of  transfers.   Time 4   Period Weeks   Status New     PT SHORT TERM GOAL #3   Title Pt will improve TUG score to less than or equal to 13.5 seconds for decreased fall risk.   Time 4   Period Weeks   Status New     PT SHORT TERM GOAL #4  Title Pt will improve SLS to at least 3 seconds each leg, for improved obstacle and step negotiation.   Time 4   Period Weeks   Status New     PT SHORT TERM GOAL #5   Title Pt will verbalize understanding of local Parkinson's disease resources.   Time 4   Period Weeks   Status New           PT Long Term Goals - February 08, 2016 1419      PT LONG TERM GOAL #1   Title Pt will verbalize understanding of fall prevention in home environment.  TARGET 03/30/16   Time 8   Period Weeks   Status New     PT LONG TERM GOAL #2   Title Pt will improve 5x sit<>stand to less than or equal to 11.5 seconds for improved transfer efficiency and safety.   Time 8   Period Weeks   Status New     PT LONG TERM GOAL #3   Title Pt will improve TUG cognitive score to less than or equal to 15 seconds for decreased fall risk.   Time 8   Period Weeks   Status New     PT LONG TERM GOAL #4   Title Pt will improve MiniBESTest to at least 24/28 for decreased fall risk.   Time 8   Period Weeks   Status New     PT LONG TERM GOAL #5   Title Pt will verbalize plans for continued community fitness upon D/C from PT.   Time 8   Period Weeks   Status New               Plan - 08-Feb-2016 1412    Clinical Impression Statement Pt is a 70 year old male who presents to OP PT with recent diagnosis of Parkinson's disease (12/2015).  Pt presents with LUE tremor, LLE rigidity, bradykinesia, postural abnormality, decreased balance, decreased timing and coordination of gait.  Pt reports slowed walking, slowed/more difficult transfers, but no falls.  Pt presents at fall risk per TUG and MiniBESTest scores.  Pt would benefit from skilled PT to address the above stated deficits to  improve functional mobility and decreased fall risk.   Rehab Potential Good   PT Frequency 2x / week   PT Duration 8 weeks  plus eval   PT Treatment/Interventions ADLs/Self Care Home Management;Functional mobility training;Gait training;Therapeutic activities;Therapeutic exercise;Balance training;Neuromuscular re-education;Patient/family education   PT Next Visit Plan Review hamstring stretch given at eval session; work on transfers with progressively lower surfaces, PWR! Standing exercises   Consulted and Agree with Plan of Care Patient;Family member/caregiver   Family Member Consulted wife, daughter      Patient will benefit from skilled therapeutic intervention in order to improve the following deficits and impairments:  Abnormal gait, Decreased balance, Decreased mobility, Decreased strength, Difficulty walking, Impaired flexibility, Postural dysfunction  Visit Diagnosis: Other abnormalities of gait and mobility  Abnormal posture  Unsteadiness on feet  Other symptoms and signs involving the nervous system      G-Codes - 02/08/2016 1422    Functional Assessment Tool Used 5x sit<>stand 17.98 sec, TUG 16.48 sec, TUG cog 17.18 sec, MiniBESTest 21/28   Functional Limitation Mobility: Walking and moving around   Mobility: Walking and Moving Around Current Status [phone]) At least 20 percent but less than 40 percent impaired, limited or restricted   Mobility: Walking and Moving Around Goal Status [id number]) At least 1 percent but less than 20 percent  impaired, limited or restricted       Problem List Patient Active Problem List   Diagnosis Date Noted  . PD (Parkinson's disease) ([place]) 12/18/2015  . Tremor of both hands 11/22/2015  . PAD (peripheral artery disease) s/p remote left external iliac artery angioplasty 10/28/2012  . Coronary atherosclerosis 10/28/2012  . Hyperlipidemia 10/28/2012  . HTN (hypertension) 10/28/2012  . DM2 (diabetes mellitus, type 2) ([place]) 10/28/2012  .  Carotid occlusion, right 10/28/2012    Elliott Lasecki W. 01/30/2016, 2:23 PM  Frazier Butt., PT  [facility] [address] [place], [place], [address] Phone: [phone]   Fax:  [phone]  Name: Rodney Love MRN: [id number] Date of Birth: 1945-10-26

## 2016-01-30 NOTE — Therapy (Signed)
[name] [address] [place], [place], [address] Phone: [phone]   Fax:  [phone]  Occupational Therapy Evaluation  Patient Details  Name: Rodney Love MRN: [id number] Date of Birth: Dec 17, 1945 Referring Provider: Dr. Carles Love  Encounter Date: 01/30/2016      OT End of Session - 01/30/16 1559    Visit Number 1   Number of Visits 17   Date for OT Re-Evaluation 03/29/16   Authorization Type [employer] Medicare   Authorization - Visit Number 1   Authorization - Number of Visits 10   OT Start Time [id number]   OT Stop Time 1230   OT Time Calculation (min) 38 min   Activity Tolerance Patient tolerated treatment well      Past Medical History:  Diagnosis Date  . Arthritis   . Constipation    OCCASIONAL  . Coronary artery disease   . Diabetes mellitus without complication ([place])   . Environmental allergies   . Hyperlipidemia   . Hypertension   . Inguinal hernia   . Nocturia   . Peripheral arterial disease [facility])     Past Surgical History:  Procedure Laterality Date  . BACK SURGERY    . CARDIAC CATHETERIZATION  05/29/2006   single vessel CAD w/moderate stenosis of the prox. RCA approx 40%,prox. LAD 20-30%,minor irreg. mid abdominal aorta,left iliac artery disease 50-60%  . CATARACT EXTRACTION Right   . HERNIA REPAIR  [id number]  . INGUINAL HERNIA REPAIR N/A 09/23/2012   Procedure: LAPAROSCOPIC INGUINAL HERNIA with umbilical hernia;  Surgeon: Madilyn Hook, DO;  Location: [facility];  Service: General;  Laterality: N/A;  . INSERTION OF MESH Bilateral 09/23/2012   Procedure: INSERTION OF MESH;  Surgeon: Madilyn Hook, DO;  Location: [facility];  Service: General;  Laterality: Bilateral;    There were no vitals filed for this visit.      Subjective Assessment - 01/30/16 1639    Subjective  Pt newly diagnosed with PD in Sept. 2017, has PMH including back surgery   Pertinent History see Epic, hx of back surgery   Patient Stated  Goals improve use of LUE   Currently in Pain? Yes   Pain Score 3    Pain Location Shoulder   Pain Orientation Left   Pain Descriptors / Indicators Aching   Pain Type Acute pain   Pain Onset More than a month ago   Pain Frequency Intermittent   Aggravating Factors  shoulder flexion   Pain Relieving Factors rest   Effect of Pain on Daily Activities limits ADLs   Multiple Pain Sites No           [facility] OT Assessment - 01/30/16 1259      Assessment   Diagnosis Parkinson's disease   Referring Provider Dr. Carles Love   Onset Date 12/18/15   Assessment Pt newly diagnosed with PD in Sept 2017.     Precautions   Precautions Fall     Balance Screen   Has the patient fallen in the past 6 months No   Has the patient had a decrease in activity level because of a fear of falling?  No   Is the patient reluctant to leave their home because of a fear of falling?  No     Home  Environment   Family/patient expects to be discharged to: Private residence   Dent One level   Bathroom Shower/Tub Tub/Shower unit   Lives With Spouse  Prior Function   Level of Independence Independent   Vocation Retired   Leisure watching TV, used to fish and play music, walking in park     ADL   Eating/Feeding Modified independent   Grooming Modified independent   Upper Body Bathing Modified independent  increased time required   Lower Body Bathing Modified independent   Upper Body Dressing Minimal assistance  donning shirt, jacket   Lower Body Dressing Modified independent   Toilet Tranfer Independent   Tub/Shower Transfer Modified independent   ADL comments requires increased time for ADLs.Marland Kitchen     IADL   Shopping --  did not shop before   Light Housekeeping Performs light daily tasks such as dishwashing, bed making   Meal Prep Does not utilize stove or oven   Medication Management Is responsible for taking medication in correct dosages at correct time   Financial  Management --  perfoms with his wife     Mobility   Mobility Status Independent     Written Expression   Handwriting 100% legible  no significant decrease in letter size     Vision - History   Baseline Vision Wears glasses only for reading   Visual History Cataracts     Vision Assessment   Vision Assessment Vision not tested     Cognition   Overall Cognitive Status Within Functional Limits for tasks assessed     Observation/Other Assessments   Other Surveys  Select   Physical Performance Test   Yes   Simulated Eating Time (seconds) 14.22 secs   Donning Doffing Jacket Time (seconds) 28.91 secs  with pt's own jacket   Donning Doffing Jacket Comments 3 button/ unbutton: 48.28 secs     Coordination   Fine Motor Movements are Fluid and Coordinated No   9 Hole Peg Test Right;Left   Right 9 Hole Peg Test 28.47   Left 9 Hole Peg Test 41.19   Box and Blocks RUE 55 blocks, LUE 50 blocks   Tremors LUE resting tremor     Tone   Assessment Location Right Upper Extremity;Left Upper Extremity     ROM / Strength   AROM / PROM / Strength AROM     AROM   Overall AROM  Deficits   Overall AROM Comments R shoulder flexion 115, L shoulder flexion 105*, Left elbow extension -15, mild limitation in lbilateral supination and left wrist extension.  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 due 02/29/16   Time 4   Period Weeks   Status New     OT SHORT TERM GOAL #2   Title Pt will verbalize understanding of adapted strateiges for ADLs/IADLs.   Time 4   Period Weeks   Status New     OT SHORT TERM GOAL #3   Title Pt will demonstrate ability to retrieve a lightweight object at 115 shoulder flexion , and -10 elbow extension  with LUE with pain less than or equal to 2/10.   Time 4   Period  Weeks   Status New     OT SHORT TERM GOAL #4   Title Pt will demonstrate improved fine motor coordination as evidenced by decreasing LUE 9 hole peg test score to 36 secs or less.   Baseline RUE: 28.47 secs, LUE 41.19 secs   Time 8   Period Weeks   Status New           OT Long Term Goals - 01/30/16 1633      OT LONG TERM GOAL #1   Title Pt will verbalize understanding of ways to keep thinking skills sharp, memory compensations PRN..   Time 8   Period Weeks   Status New     OT LONG TERM GOAL #2   Title Pt will verbalize understanding of community resources and ways to prevent future PD- related complications.   Time 8   Period Weeks   Status New     OT LONG TERM GOAL #3   Title Pt will decrease 3 button/ unbutton score to 40 secs or less.   Baseline 48.28   Time 8   Period Weeks   Status New     OT LONG TERM GOAL #4   Title Pt will  demonstrate improved ease with dressing as evidenced by performing PPT# 4(don/doff jacket) in 24 secs or less   Baseline 28.91 secs   Time 8   Period Weeks   Status New               Plan - 01/30/16 1619    Clinical Impression Statement Pt is a 70 y.o male dignosed with Parkinson's disease in Sept. 2017. PMH is significant for back surgery, HTN, DM, hyperlipidemia. Pt presents to occupational therapy with bradykinesia, decreased balance, decreased coordination, rigidity, left shoulder pain which impedes performance of daily activities. Pt can benefit from skilled occupational therapy to maximize safety and indpendence with ADLs/ IADLs.   Rehab Potential Good   OT Frequency 2x / week  plus eval   OT Duration 8 weeks   OT Treatment/Interventions Self-care/ADL training;Moist Heat;Fluidtherapy;DME and/or AE instruction;Patient/family education;Balance training;Therapeutic exercises;Ultrasound;Therapeutic exercise;Therapeutic activities;Passive range of motion;Cognitive  remediation/compensation;Functional Mobility Training;Neuromuscular education;Parrafin;Energy conservation;Manual Therapy;Visual/perceptual remediation/compensation   Plan initiate HEP(PWR/ coordination)   Consulted and Agree with Plan of Care Patient      Patient will benefit from skilled therapeutic intervention in order to improve the following deficits and impairments:  Abnormal gait, Decreased coordination, Decreased range of motion, Difficulty walking, Impaired flexibility, Decreased safety awareness, Decreased endurance, Decreased activity tolerance, Decreased knowledge of precautions, Impaired tone, Impaired UE functional use, Pain, Decreased knowledge of use of DME, Decreased balance, Decreased mobility, Decreased strength, Impaired perceived functional ability, Impaired vision/preception  Visit Diagnosis: Other lack of coordination - Plan: Ot plan of care cert/re-cert  Other symptoms and signs involving the musculoskeletal system - Plan: Ot plan of care cert/re-cert  Other symptoms and signs involving the nervous system - Plan: Ot plan of care cert/re-cert  Left shoulder pain, unspecified chronicity - Plan: Ot plan of care cert/re-cert  Stiffness of joint, shoulder region, left - Plan: Ot plan of care cert/re-cert      G-Codes - [id number]    Functional Assessment Tool Used 3 button/ unbutton: 48.28 secs,  9 hole peg test: RUE: 28.47 secs, LUE 41.19 secs   Functional Limitation Carrying, moving and handling objects   Carrying, Moving and Handling Objects Current Status [id number]) At least 20 percent but less than 40 percent impaired, limited or restricted   Carrying,  Moving and Handling Objects Goal Status [id number]) At least 1 percent but less than 20 percent impaired, limited or restricted      Problem List Patient Active Problem List   Diagnosis Date Noted  . PD (Parkinson's disease) ([place]) 12/18/2015  . Tremor of both hands 11/22/2015  . PAD (peripheral artery disease) s/p  remote left external iliac artery angioplasty 10/28/2012  . Coronary atherosclerosis 10/28/2012  . Hyperlipidemia 10/28/2012  . HTN (hypertension) 10/28/2012  . DM2 (diabetes mellitus, type 2) ([place]) 10/28/2012  . Carotid occlusion, right 10/28/2012    Dalon Reichart 01/30/2016, 4:42 PM Theone Murdoch, OTR/L [phone] Phone: [phone] 4:43 PM 01/30/16 [facility] [address] [place], [place], [address] Phone: [phone]   Fax:  [phone]  Name: Rodney Love MRN: [id number] Date of Birth: 16-Jan-1946

## 2016-01-30 NOTE — Patient Instructions (Addendum)
HIP: Hamstrings - Short Sitting    Rest leg on raised surface or on the floor. Keep knee straight. Lift chest.  Stick your chest forward until you feel a gentle stretch. Hold _30__ seconds. _3__ reps per set, _1-2__ sets per day.  Copyright  VHI. All rights reserved.

## 2016-01-30 NOTE — Patient Instructions (Signed)
  Big Breath   Loud AH!! 5x a day twice a day - big breath before each one  Read aloud twice a day - big breaths at periods  It's OK to feel like you are talking too loud - you have gotten used to a quieter voice due to PD  Feel like you are projecting across a room   We generate volume by taking a big breath  Think shout!!   When you are feeling fatigued and having trouble talking loud:  Get the persons attention before you speak  Use eye contact and face the person you are speaking to  Be in close proximity to the person you are speaking to  Turn down any noise in the environment such as the TV, walk away from loud appliances, air conditioners, fans, dish washers etc

## 2016-01-31 ENCOUNTER — Ambulatory Visit: Payer: Medicare Other | Attending: Neurology | Admitting: Occupational Therapy

## 2016-01-31 DIAGNOSIS — R471 Dysarthria and anarthria: Secondary | ICD-10-CM | POA: Insufficient documentation

## 2016-01-31 DIAGNOSIS — R29898 Other symptoms and signs involving the musculoskeletal system: Secondary | ICD-10-CM | POA: Diagnosis not present

## 2016-01-31 DIAGNOSIS — R2689 Other abnormalities of gait and mobility: Secondary | ICD-10-CM | POA: Insufficient documentation

## 2016-01-31 DIAGNOSIS — M25612 Stiffness of left shoulder, not elsewhere classified: Secondary | ICD-10-CM | POA: Insufficient documentation

## 2016-01-31 DIAGNOSIS — R29818 Other symptoms and signs involving the nervous system: Secondary | ICD-10-CM | POA: Insufficient documentation

## 2016-01-31 DIAGNOSIS — R293 Abnormal posture: Secondary | ICD-10-CM | POA: Insufficient documentation

## 2016-01-31 DIAGNOSIS — R278 Other lack of coordination: Secondary | ICD-10-CM | POA: Insufficient documentation

## 2016-01-31 DIAGNOSIS — M25512 Pain in left shoulder: Secondary | ICD-10-CM | POA: Diagnosis not present

## 2016-01-31 DIAGNOSIS — R2681 Unsteadiness on feet: Secondary | ICD-10-CM | POA: Diagnosis not present

## 2016-01-31 NOTE — Patient Instructions (Signed)
Coordination Exercises  Perform the following exercises for 20 minutes 1-2 times per day. Perform with both hand(s). Perform using big movements.   Flipping Cards: Place deck of cards on the table. Flip cards over by opening your hand big to grasp and then turn your palm up big.  Deal cards: Hold 1/2 or whole deck in your hand. Use thumb to push card off top of deck with one big push.  Rotate ball with fingertips: Pick up with fingers/thumb and move as much as you can with each turn/movement (clockwise and counter-clockwise).  Toss ball from one hand to the other: Toss big/high.  Toss ball in the air and catch with the same hand: Toss big/high.  Pick up coins and place in coin bank or container: Pick up with big, intentional movements. Do not drag coin to the edge.  Pick up coins and stack one at a time: Pick up with big, intentional movements. Do not drag coin to the edge. (5-10 in a stack)  Pick up 5-10 coins one at a time and hold in palm. Then, move coins from palm to fingertips one at time and place in coin bank/container.  Practice writing: Slow down, write big, and focus on forming each letter.  Perform "Flicks"/hand stretches (PWR! Hands): Close hands then flick out your fingers with focus on opening hands, pulling wrists back, and extending elbows like you are pushing.

## 2016-01-31 NOTE — Therapy (Signed)
[facility] [address] [place], [place], [address] Phone: [phone]   Fax:  [phone]  Occupational Therapy Treatment  Patient Details  Name: Rodney Love MRN: [id number] Date of Birth: [birth year] Referring Provider: Dr. Carles Collet  Encounter Date: 01/31/2016      OT End of Session - 01/31/16 1522    Visit Number 2   Number of Visits 17   Date for OT Re-Evaluation 03/29/16   Authorization Type [employer] Medicare   Authorization - Visit Number 2   Authorization - Number of Visits 10   OT Start Time [id number]   OT Stop Time 1445   OT Time Calculation (min) 40 min   Activity Tolerance Patient tolerated treatment well   Behavior During Therapy [facility] for tasks assessed/performed      Past Medical History:  Diagnosis Date  . Arthritis   . Constipation    OCCASIONAL  . Coronary artery disease   . Diabetes mellitus without complication ([place])   . Environmental allergies   . Hyperlipidemia   . Hypertension   . Inguinal hernia   . Nocturia   . Peripheral arterial disease [facility])     Past Surgical History:  Procedure Laterality Date  . BACK SURGERY    . CARDIAC CATHETERIZATION  05/29/2006   single vessel CAD w/moderate stenosis of the prox. RCA approx 40%,prox. LAD 20-30%,minor irreg. mid abdominal aorta,left iliac artery disease 50-60%  . CATARACT EXTRACTION Right   . HERNIA REPAIR  [id number]  . INGUINAL HERNIA REPAIR N/A 09/23/2012   Procedure: LAPAROSCOPIC INGUINAL HERNIA with umbilical hernia;  Surgeon: Madilyn Hook, DO;  [facility]: [facility];  Service: General;  Laterality: N/A;  . INSERTION OF MESH Bilateral 09/23/2012   Procedure: INSERTION OF MESH;  Surgeon: Madilyn Hook, DO;  Location: [facility];  Service: General;  Laterality: Bilateral;    There were no vitals filed for this visit.      Subjective Assessment - 01/31/16 1526    Pertinent History see Epic, hx of back surgery   Patient Stated Goals improve use of LUE    Currently in Pain? Yes   Pain Score 3    Pain Location Shoulder   Pain Orientation Left   Pain Descriptors / Indicators Aching   Pain Type Acute pain   Pain Onset More than a month ago   Aggravating Factors  shoulder flexion above 80*   Pain Relieving Factors rest   Effect of Pain on Daily Activities limits functional use            [facility] OT Assessment - 01/31/16 0001      Precautions   Precautions Fall   Precaution Comments hx of back surgery, limit twisting                     PWR [facility] - 01/31/16 1524    PWR! Up x10   PWR! Rock x10   PWR! Twist did not perform due to hx of back surgery   PWR! Step x10   Comments mod / max v.c and demonstration             OT Education - 01/31/16 1528    Education provided Yes   Education Details PWR! seated( up, rock, step only)seated, coordination HEP   Person(s) Educated Patient;Child(ren)   Methods Explanation;Demonstration;Tactile cues;Verbal cues;Handout   Comprehension Verbalized understanding;Returned demonstration;Verbal cues required          OT Short Term Goals - 01/30/16 1631  OT SHORT TERM GOAL #1   Title I with PD specific HEP. due 02/29/16   Time 4   Period Weeks   Status New     OT SHORT TERM GOAL #2   Title Pt will verbalize understanding of adapted strateiges for ADLs/IADLs.   Time 4   Period Weeks   Status New     OT SHORT TERM GOAL #3   Title Pt will demonstrate ability to retrieve a lightweight object at 115 shoulder flexion , and -10 elbow extension with LUE with pain less than or equal to 2/10.   Time 4   Period Weeks   Status New     OT SHORT TERM GOAL #4   Title Pt will demonstrate improved fine motor coordination as evidenced by decreasing LUE 9 hole peg test score to 36 secs or less.   Baseline RUE: 28.47 secs, LUE 41.19 secs   Time 8   Period Weeks   Status New           OT Long Term Goals - February 26, 2016 1633      OT LONG TERM GOAL #1   Title Pt will  verbalize understanding of ways to keep thinking skills sharp, memory compensations PRN..   Time 8   Period Weeks   Status New     OT LONG TERM GOAL #2   Title Pt will verbalize understanding of community resources and ways to prevent future PD- related complications.   Time 8   Period Weeks   Status New     OT LONG TERM GOAL #3   Title Pt will decrease 3 button/ unbutton score to 40 secs or less.   Baseline 48.28   Time 8   Period Weeks   Status New     OT LONG TERM GOAL #4   Title Pt will  demonstrate improved ease with dressing as evidenced by performing PPT# 4(don/doff jacket) in 24 secs or less   Baseline 28.91 secs   Time 8   Period Weeks   Status New               Plan - 01/31/16 1521    Clinical Impression Statement Pt is progressing towards goals. He verbalizes understanding of inital HEP.   Rehab Potential Good   OT Frequency 2x / week   OT Duration 8 weeks   OT Treatment/Interventions Self-care/ADL training;Moist Heat;Fluidtherapy;DME and/or AE instruction;Patient/family education;Balance training;Therapeutic exercises;Ultrasound;Therapeutic exercise;Therapeutic activities;Passive range of motion;Cognitive remediation/compensation;Functional Mobility Training;Neuromuscular education;Parrafin;Energy conservation;Manual Therapy;Visual/perceptual remediation/compensation   Plan reinforce PWR! seated  and coordination HEP PRN,    Consulted and Agree with Plan of Care Patient      Patient will benefit from skilled therapeutic intervention in order to improve the following deficits and impairments:  Abnormal gait, Decreased coordination, Decreased range of motion, Difficulty walking, Impaired flexibility, Decreased safety awareness, Decreased endurance, Decreased activity tolerance, Decreased knowledge of precautions, Impaired tone, Impaired UE functional use, Pain, Decreased knowledge of use of DME, Decreased balance, Decreased mobility, Decreased strength, Impaired  perceived functional ability, Impaired vision/preception  Visit Diagnosis: Abnormal posture  Other lack of coordination  Other symptoms and signs involving the musculoskeletal system  Left shoulder pain, unspecified chronicity      G-Codes - 2016-02-26 1630    Functional Assessment Tool Used 3 button/ unbutton: 48.28 secs,  9 hole peg test: RUE: 28.47 secs, LUE 41.19 secs   Functional Limitation Carrying, moving and handling objects   Carrying, Moving and Handling Objects Current Status [phone])  At least 20 percent but less than 40 percent impaired, limited or restricted   Carrying, Moving and Handling Objects Goal Status [id number]) At least 1 percent but less than 20 percent impaired, limited or restricted      Problem List Patient Active Problem List   Diagnosis Date Noted  . PD (Parkinson's disease) ([place]) 12/18/2015  . Tremor of both hands 11/22/2015  . PAD (peripheral artery disease) s/p remote left external iliac artery angioplasty 10/28/2012  . Coronary atherosclerosis 10/28/2012  . Hyperlipidemia 10/28/2012  . HTN (hypertension) 10/28/2012  . DM2 (diabetes mellitus, type 2) ([place]) 10/28/2012  . Carotid occlusion, right 10/28/2012    Timberlynn Kizziah 01/31/2016, 3:29 PM Theone Murdoch, OTR/L [phone] Phone: [phone] 3:29 PM 01/31/16 [facility] [address] [place], [place], [address] Phone: [phone]   Fax:  [phone]  Name: Rodney Love MRN: [id number] Date of Birth: 1945-05-10

## 2016-02-01 NOTE — Therapy (Signed)
[facility] [address] [place], [place], [address] Phone: [phone]   Fax:  [phone]  Speech Language Pathology Evaluation  Patient Details  Name: Rodney Love MRN: [id number] Date of Birth: 08/21/1945 Referring Provider: Dr. Wells Guiles Tat  Encounter Date: 01/30/2016      End of Session - 02/01/16 1454    Visit Number 1   Number of Visits 17   Date for SLP Re-Evaluation 03/28/16   SLP Start Time [id number]   SLP Stop Time  1312   SLP Time Calculation (min) 39 min      Past Medical History:  Diagnosis Date  . Arthritis   . Constipation    OCCASIONAL  . Coronary artery disease   . Diabetes mellitus without complication ([place])   . Environmental allergies   . Hyperlipidemia   . Hypertension   . Inguinal hernia   . Nocturia   . Peripheral arterial disease [facility])     Past Surgical History:  Procedure Laterality Date  . BACK SURGERY    . CARDIAC CATHETERIZATION  05/29/2006   single vessel CAD w/moderate stenosis of the prox. RCA approx 40%,prox. LAD 20-30%,minor irreg. mid abdominal aorta,left iliac artery disease 50-60%  . CATARACT EXTRACTION Right   . HERNIA REPAIR  [id number]  . INGUINAL HERNIA REPAIR N/A 09/23/2012   Procedure: LAPAROSCOPIC INGUINAL HERNIA with umbilical hernia;  Surgeon: Madilyn Hook, DO;  Location: [facility];  Service: General;  Laterality: N/A;  . INSERTION OF MESH Bilateral 09/23/2012   Procedure: INSERTION OF MESH;  Surgeon: Madilyn Hook, DO;  Location: [facility];  Service: General;  Laterality: Bilateral;    There were no vitals filed for this visit.          SLP Evaluation [facility] - 02/01/16 1451      SLP Visit Information   SLP Received On 01/30/16   Referring Provider Dr. Wells Guiles Tat   Onset Date 12/2015   Medical Diagnosis PD     Subjective   Patient/Family Stated Goal Try to improve my skills"     Pain Assessment   Currently in Pain? No/denies   Pain Onset --     Prior  Functional Status   Cognitive/Linguistic Baseline Within functional limits   Type of Home House    Lives With Spouse   Available Support Family;Friend(s)   Vocation Retired     Associate Professor   Overall Cognitive Status Within Functional Limits for tasks assessed     Oral Motor/Sensory Function   Overall Oral Motor/Sensory Function Appears within functional limits for tasks assessed   Lingual ROM Reduced right;Other (Comment);Reduced left  tremulous   Lingual Coordination Reduced  tremulous     Motor Speech   Overall Motor Speech Impaired   Phonation Low vocal intensity   Resonance Within functional limits   Intelligibility Intelligibility reduced   Conversation 75-100% accurate   Effective Techniques Increased vocal intensity                         SLP Education - 02/01/16 1453    Education provided Yes   Education Details Goals for ST, HEP for dysarthria - loud /a/, compensations for dysarthria   Person(s) Educated Patient;Spouse;Child(ren)   Methods Explanation;Demonstration;Verbal cues;Handout   Comprehension Verbalized understanding;Returned demonstration;Verbal cues required;Need further instruction          SLP Short Term Goals - 02/01/16 1458      SLP SHORT TERM GOAL #1   Title  Pt will average 90dB on loud /a/ with rare min A over 3 sessions   Time 4   Period Weeks   Status New     SLP SHORT TERM GOAL #2   Title Pt will average 70dB on 85% of  sentence level structured speech tasks with occasional min A.   Time 4   Period Weeks   Status New     SLP SHORT TERM GOAL #3   Title Pt will maintain average of 70dB during 8 minute simple conversation with occasional min A   Time 4   Period Weeks   Status New          SLP Long Term Goals - 02/01/16 1500      SLP LONG TERM GOAL #1   Title Pt will maintain average of 70dB during 15 minute moderately complex conversation with rare min A   Time 8   Period Weeks   Status New     SLP LONG  TERM GOAL #2   Title Pt. will maintain audible conversation over 12 minutes in noisy environment outside of therapy room.   Time 8   Period Weeks   Status New          Plan - 02/01/16 1454    Clinical Impression Statement Mr. Rodney Love, a 70 y.o. male was dx with PD this September. He is referred for ST due to reduced intellgilbity. Mr. Rodney Love in accompanied by his wife,Mildred and daughter, [name]. They report frequent difficulty hearing and understanding Mr. Rodney Love speech.  Rodney Love verbalizes some awareness that "people can't always hear me."  9 minutes of conversational speech was reduced today, at average 68dB (WNL= average 70-72dB) with range of 66 to  70dB, when a sound level meter was placed 30 cm away from pt's mouth. Overall intelligibility for this listener in a quiet environment was approx 95%. Production of loud /a/ averaged 88dB (range of 86 to 90dB) and mod cues frequuntly needed for loudness.   Oral motor assessment revealed WFL lingual ROM and WFL lingual strength . Labial ROM was [facility] and strength was [facility]. Velar ROM appeared [facility]. Coordination of lingual and labial movements are tremulous    Pt rated effort level at 310 for production of loud /a/ (10=maximal effort). In oral reading tasks, pt was asked to use the same amount of effort as with loud /a/. Loudness average with this increased effort was 70dB (range of 69 to 71) with mod A usually for loudness Pt's spouse remarked "This is you old voice and how you used to sound.". Pt would benefit from skilled ST in order to improve speech intelligibility and pt's QOL.  Pt and his family deny dysphagia at this time. They also deny cognitive changes.     Speech Therapy Frequency 2x / week   Duration --  8 weeks or 17 visits   Treatment/Interventions Compensatory strategies;Patient/family education;Functional tasks;Environmental controls;SLP instruction and feedback   Potential to [place]!! 5x twice a day   Consulted and Agree with Plan of Care Patient;Family member/caregiver   Family Member Consulted wife [name]; daughter [name]      Patient will benefit from skilled therapeutic intervention in order to improve the following deficits and impairments:   Dysarthria and anarthria - Plan: SLP plan of care cert/re-cert    Problem List Patient Active Problem List   Diagnosis Date Noted  . PD (Parkinson's disease) ([place]) 12/18/2015  . Tremor of  both hands 11/22/2015  . PAD (peripheral artery disease) s/p remote left external iliac artery angioplasty 10/28/2012  . Coronary atherosclerosis 10/28/2012  . Hyperlipidemia 10/28/2012  . HTN (hypertension) 10/28/2012  . DM2 (diabetes mellitus, type 2) ([place]) 10/28/2012  . Carotid occlusion, right 10/28/2012    Maralee Higuchi, Annye Rusk MS, CCC-SLP 02/01/2016, 3:04 PM  [facility] [address] [place], [place], [address] Phone: [phone]   Fax:  [phone]  Name: Rodney Love MRN: [id number] Date of Birth: October 03, 1945

## 2016-02-10 NOTE — Progress Notes (Signed)
Rodney Love was seen today in the movement disorders clinic for neurologic consultation at the request of Horatio Pel, MD.  The consultation is for the evaluation of tremor.  The records that were made available to me were reviewed. This patient is accompanied in the office by his daughter who supplements the history.  The records that were made available to me were reviewed.  Cardiology notes do indicate resting tremor.  Cardiology thought that it was likely ET and propranolol was started on 11/22/15.  He was given 10 mg bid but states that he couldn't handle that due to lightheaded/dizzy and went to 5 mg bid.  He thinks that it helps some.   02/13/16 update:  Pt was seen in f/u, accompanied by his daughter who supplements the history.  Started on carbidopa/levodopa 25/100 tid last visit after being seen and dx with PD.  If he doesn't take it on time, he will get "jittery."  His propranolol was d/c by his cardiologist.  He had an MRI brain of the brain on 12/25/15 demonstrating scattered, mild-mod T2 hyperintensities, slighly more at grey-white jxn. It also demonstrated a known, right carotid occlusion, which cardiology has been following with carotid u/s, last done in March, 2017.  He is still attending PD therapies at the neurorehab center and those notes were reviewed.   ALLERGIES:  No Known Allergies  CURRENT MEDICATIONS:  Outpatient Encounter Prescriptions as of 02/13/2016  Medication Sig  . aspirin EC 81 MG tablet Take 81 mg by mouth daily.  Marland Kitchen atorvastatin (LIPITOR) 20 MG tablet Take 0.5 tablets by mouth daily.  . carbidopa-levodopa (SINEMET IR) 25-100 MG tablet Take 1 tablet by mouth 3 (three) times daily.  . Multiple Vitamin (MULTIVITAMIN) tablet Take 1 tablet by mouth daily.  . Omega-3 Fatty Acids (FISH OIL) 1200 MG CAPS Take 1,200 mg by mouth every evening.  . propranolol (INDERAL) 10 MG tablet Take 1 tablet (10 mg total) by mouth 2 (two) times daily. (Patient not taking:  Reported on 01/30/2016)  . tamsulosin (FLOMAX) 0.4 MG CAPS capsule Take 1 capsule by mouth daily.   No facility-administered encounter medications on file as of 02/13/2016.     PAST MEDICAL HISTORY:   Past Medical History:  Diagnosis Date  . Arthritis   . Constipation    OCCASIONAL  . Coronary artery disease   . Diabetes mellitus without complication ([place])   . Environmental allergies   . Hyperlipidemia   . Hypertension   . Inguinal hernia   . Nocturia   . Peripheral arterial disease ([place])     PAST SURGICAL HISTORY:   Past Surgical History:  Procedure Laterality Date  . BACK SURGERY    . CARDIAC CATHETERIZATION  05/29/2006   single vessel CAD w/moderate stenosis of the prox. RCA approx 40%,prox. LAD 20-30%,minor irreg. mid abdominal aorta,left iliac artery disease 50-60%  . CATARACT EXTRACTION Right   . HERNIA REPAIR  [id number]  . INGUINAL HERNIA REPAIR N/A 09/23/2012   Procedure: LAPAROSCOPIC INGUINAL HERNIA with umbilical hernia;  Surgeon: Madilyn Hook, DO;  Location: [facility];  Service: General;  Laterality: N/A;  . INSERTION OF MESH Bilateral 09/23/2012   Procedure: INSERTION OF MESH;  Surgeon: Madilyn Hook, DO;  Location: [facility];  Service: General;  Laterality: Bilateral;    SOCIAL HISTORY:   Social History   Social History  . Marital status: Married    Spouse name: N/A  . Number of children: N/A  . Years of education: N/A  Occupational History  . retired     Architect   Social History Main Topics  . Smoking status: Former Smoker    Quit date: 04/02/1991  . Smokeless tobacco: Former Systems developer    Quit date: 08/06/1991  . Alcohol use No  . Drug use: No  . Sexual activity: Not on file   Other Topics Concern  . Not on file   Social History Narrative  . No narrative on file    FAMILY HISTORY:   Family Status  Relation Status  . Mother Deceased  . Father Deceased  . Brother Deceased  . Sister Deceased  . Sister Deceased  . Sister Alive  . Daughter Alive    . Daughter Alive  . Son Alive    ROS:  A complete 10 system review of systems was obtained and was unremarkable apart from what is mentioned above.  PHYSICAL EXAMINATION:    VITALS:   There were no vitals filed for this visit.  GEN:  The patient appears stated age and is in NAD. HEENT:  Normocephalic, atraumatic.  The mucous membranes are moist. The superficial temporal arteries are without ropiness or tenderness. CV:  RRR Lungs:  CTAB Neck/HEME:  There are no carotid bruits bilaterally.  Neurological examination:  Orientation: The patient is alert and oriented x3. Fund of knowledge is appropriate.  Recent and remote memory are intact.  Attention and concentration are normal.    Able to name objects and repeat phrases. Cranial nerves: There is good facial symmetry.   There is significant facial hypomima with decreased blink.  Pupils are equal round and reactive to light bilaterally. Fundoscopic exam is attempted but the disc margins are not well visualized bilaterally.  Extraocular muscles are intact. The visual fields are full to confrontational testing. The speech is fluent and clear. Soft palate rises symmetrically and there is no tongue deviation. Hearing is intact to conversational tone. Sensation: Sensation is intact to light touch throughout Motor: Strength is 5/5 in the bilateral upper and lower extremities.   Shoulder shrug is equal and symmetric.  There is no pronator drift.   Movement examination: Tone: There is min increased tone in the LUE.   Abnormal movements: There is a LUE resting tremor that slightly increases with distraction.   Coordination:  There is only mild decremation with RAM's with finger taps and toe taps on the L Gait and Station: The patient has no difficulty arising out of a deep-seated chair without the use of the hands. The patient's stride length is slightly decreased but he has preserved arm swing bilaterally.    ASSESSMENT/PLAN:  1.   Parkinsonism.  I suspect that this does represent idiopathic Parkinson's disease.  The patient has tremor, bradykinesia, rigidity and mild postural instability.  -continue carbidopa/levodopa 25/100 tid  -continue therapy at the neurorehab center  -given information on rock steady boxing  2.  Hyperreflexia  -MRI brain unremarkable except for small vessel disease and known R carotid occlusion.  No neck pain  3.  R carotid occlusion  -nothing further to be done in that regard  4.  Follow up is anticipated in the next few months, sooner should new neurologic issues arise.  Much greater than 50% of this visit was spent in counseling and coordinating care.  Total face to face time:  35 min     Cc:  Horatio Pel, MD

## 2016-02-13 ENCOUNTER — Encounter: Payer: Self-pay | Admitting: Neurology

## 2016-02-13 ENCOUNTER — Ambulatory Visit: Payer: Medicare Other | Admitting: Occupational Therapy

## 2016-02-13 ENCOUNTER — Ambulatory Visit (INDEPENDENT_AMBULATORY_CARE_PROVIDER_SITE_OTHER): Payer: Medicare Other | Admitting: Neurology

## 2016-02-13 ENCOUNTER — Ambulatory Visit: Payer: Medicare Other

## 2016-02-13 VITALS — BP 124/70 | HR 75 | Ht 73.0 in | Wt 184.0 lb

## 2016-02-13 DIAGNOSIS — M25612 Stiffness of left shoulder, not elsewhere classified: Secondary | ICD-10-CM | POA: Diagnosis not present

## 2016-02-13 DIAGNOSIS — R2681 Unsteadiness on feet: Secondary | ICD-10-CM

## 2016-02-13 DIAGNOSIS — G20 Parkinson's disease: Secondary | ICD-10-CM

## 2016-02-13 DIAGNOSIS — R278 Other lack of coordination: Secondary | ICD-10-CM | POA: Diagnosis not present

## 2016-02-13 DIAGNOSIS — R29818 Other symptoms and signs involving the nervous system: Secondary | ICD-10-CM | POA: Diagnosis not present

## 2016-02-13 DIAGNOSIS — R471 Dysarthria and anarthria: Secondary | ICD-10-CM | POA: Diagnosis not present

## 2016-02-13 DIAGNOSIS — R2689 Other abnormalities of gait and mobility: Secondary | ICD-10-CM | POA: Diagnosis not present

## 2016-02-13 DIAGNOSIS — R29898 Other symptoms and signs involving the musculoskeletal system: Secondary | ICD-10-CM | POA: Diagnosis not present

## 2016-02-13 DIAGNOSIS — M25512 Pain in left shoulder: Secondary | ICD-10-CM | POA: Diagnosis not present

## 2016-02-13 DIAGNOSIS — R292 Abnormal reflex: Secondary | ICD-10-CM

## 2016-02-13 DIAGNOSIS — R293 Abnormal posture: Secondary | ICD-10-CM | POA: Diagnosis not present

## 2016-02-13 MED ORDER — CARBIDOPA-LEVODOPA 25-100 MG PO TABS: 1.0000 | ORAL_TABLET | Freq: Three times a day (TID) | ORAL | 1 refills | Status: DC

## 2016-02-13 NOTE — Therapy (Signed)
[facility] [address] [place], [place], [address] Phone: [phone]   Fax:  [phone]  Speech Language Pathology Treatment  Patient Details  Name: Rodney Love MRN: [id number] Date of Birth: 1945/12/04 Referring Provider: Dr. Wells Guiles Tat  Encounter Date: 02/13/2016      End of Session - 02/13/16 1623    Visit Number 2   Number of Visits 17   Date for SLP Re-Evaluation 03/28/16   SLP Start Time 1532   SLP Stop Time  [id number]   SLP Time Calculation (min) 43 min   Activity Tolerance Patient tolerated treatment well      Past Medical History:  Diagnosis Date  . Arthritis   . Constipation    OCCASIONAL  . Coronary artery disease   . Diabetes mellitus without complication ([place])   . Environmental allergies   . Hyperlipidemia   . Hypertension   . Inguinal hernia   . Nocturia   . Peripheral arterial disease [facility])     Past Surgical History:  Procedure Laterality Date  . BACK SURGERY    . CARDIAC CATHETERIZATION  05/29/2006   single vessel CAD w/moderate stenosis of the prox. RCA approx 40%,prox. LAD 20-30%,minor irreg. mid abdominal aorta,left iliac artery disease 50-60%  . CATARACT EXTRACTION Right   . HERNIA REPAIR  [id number]  . INGUINAL HERNIA REPAIR N/A 09/23/2012   Procedure: LAPAROSCOPIC INGUINAL HERNIA with umbilical hernia;  Surgeon: Madilyn Hook, DO;  Location: [facility];  Service: General;  Laterality: N/A;  . INSERTION OF MESH Bilateral 09/23/2012   Procedure: INSERTION OF MESH;  Surgeon: Madilyn Hook, DO;  [facility]: [facility];  Service: General;  Laterality: Bilateral;    There were no vitals filed for this visit.      Subjective Assessment - 02/13/16 1538    Subjective "She said my voice had to be around - - 70-75."   Patient is accompained by: Family member  oldest daughter, [name]               ADULT SLP TREATMENT - 02/13/16 1539      General Information   Behavior/Cognition  Alert;Pleasant mood;Cooperative     Treatment Provided   Treatment provided Cognitive-Linquistic     Pain Assessment   Pain Assessment No/denies pain     Cognitive-Linquistic Treatment   Treatment focused on Dysarthria   Skilled Treatment Pt req'd coaching on proper voice use for loud /a/ and SLP shaped /a/ with proper non-straining voice quality. Pt maintained average 90dB over 5 reps with occasional verbal and nonverbal cues from SLP for loudness and proper voice quality. SLP also educated pt on lo-hi "ah" as he has somewhat monotonous speech intonation. In structured tasks of sentence length, pt maintained WNL volume of 70dB with usual min cues from SLP. SLP explained/educated pt with rationale for loud /a/ and for pitch variation exercises.      Assessment / Recommendations / Plan   Plan Continue with current plan of care     Progression Toward Goals   Progression toward goals Progressing toward goals          SLP Education - 02/13/16 1622    Education provided Yes   Education Details rationale for loud /a/ and pitch variation exercises, loud /a/ to come from abdomen not the larynx   Person(s) Educated Patient;Child(ren)   Methods Explanation   Comprehension Verbalized understanding          SLP Short Term Goals - 02/13/16 1627  SLP SHORT TERM GOAL #1   Title Pt will average 90dB on loud /a/ with rare min A over 3 sessions   Time 4   Period Weeks   Status On-going     SLP SHORT TERM GOAL #2   Title Pt will average 70dB on 85% of  sentence level structured speech tasks with occasional min A.   Time 4   Period Weeks   Status On-going     SLP SHORT TERM GOAL #3   Title Pt will maintain average of 70dB during 8 minute simple conversation with occasional min A   Time 4   Period Weeks   Status On-going          SLP Long Term Goals - 02/13/16 1627      SLP LONG TERM GOAL #1   Title Pt will maintain average of 70dB during 15 minute moderately complex  conversation with rare min A   Time 8   Period Weeks   Status On-going     SLP LONG TERM GOAL #2   Title Pt. will maintain audible conversation over 12 minutes in noisy environment outside of therapy room.   Time 8   Period Weeks   Status On-going          Plan - 02/13/16 1623    Clinical Impression Statement Pt had a relatively successful first therapy session targeting loudness and shaping loud /a/ for appropriate voice quality with education on pitch variation exercises as well. He req'd SLP cues for loudness with sentence responses, usually. Cont'd skilled ST is necessary to habitualize WNL volume in conversation and more normal speech intonation.   Speech Therapy Frequency 2x / week   Duration --  8 weeks   Treatment/Interventions Compensatory strategies;Patient/family education;Functional tasks;Environmental controls;SLP instruction and feedback   Potential to Achieve Goals Good      Patient will benefit from skilled therapeutic intervention in order to improve the following deficits and impairments:   Dysarthria and anarthria    Problem List Patient Active Problem List   Diagnosis Date Noted  . PD (Parkinson's disease) ([place]) 12/18/2015  . Tremor of both hands 11/22/2015  . PAD (peripheral artery disease) s/p remote left external iliac artery angioplasty 10/28/2012  . Coronary atherosclerosis 10/28/2012  . Hyperlipidemia 10/28/2012  . HTN (hypertension) 10/28/2012  . DM2 (diabetes mellitus, type 2) ([place]) 10/28/2012  . Carotid occlusion, right 10/28/2012    [facility] ,[place], CCC-SLP  02/13/2016, 4:28 PM  [facility] [address] [place], [place], [address] Phone: [phone]   Fax:  [phone]   Name: Rodney Love MRN: [id number] Date of Birth: 1945-10-01

## 2016-02-13 NOTE — Therapy (Signed)
[facility] [address] [place], [place], [address] Phone: [phone]   Fax:  [phone]  Occupational Therapy Treatment  Patient Details  Name: Rodney Love MRN: [id number] Date of Birth: October 02, 1945 Referring Provider: Dr. Carles Collet  Encounter Date: 02/13/2016      OT End of Session - 02/13/16 1438    Visit Number 3   Number of Visits 17   Date for OT Re-Evaluation 03/29/16   Authorization Type U[employer] Medicare   Authorization - Visit Number 3   Authorization - Number of Visits 10   OT Start Time [id number]   OT Stop Time 1533   OT Time Calculation (min) 48 min   Activity Tolerance Patient tolerated treatment well   Behavior During Therapy [facility] for tasks assessed/performed      Past Medical History:  Diagnosis Date  . Arthritis   . Constipation    OCCASIONAL  . Coronary artery disease   . Diabetes mellitus without complication ([place])   . Environmental allergies   . Hyperlipidemia   . Hypertension   . Inguinal hernia   . Nocturia   . Peripheral arterial disease [facility])     Past Surgical History:  Procedure Laterality Date  . BACK SURGERY    . CARDIAC CATHETERIZATION  05/29/2006   single vessel CAD w/moderate stenosis of the prox. RCA approx 40%,prox. LAD 20-30%,minor irreg. mid abdominal aorta,left iliac artery disease 50-60%  . CATARACT EXTRACTION Right   . HERNIA REPAIR  [id number]  . INGUINAL HERNIA REPAIR N/A 09/23/2012   Procedure: LAPAROSCOPIC INGUINAL HERNIA with umbilical hernia;  Surgeon: Madilyn Hook, DO;  Location: [facility];  Service: General;  Laterality: N/A;  . INSERTION OF MESH Bilateral 09/23/2012   Procedure: INSERTION OF MESH;  Surgeon: Madilyn Hook, DO;  Location: [facility];  Service: General;  Laterality: Bilateral;    There were no vitals filed for this visit.      Subjective Assessment - 02/13/16 1453    Subjective  Pt reports difficulty raising L arm. (beginning of session)  "I feel better" (at  end of session   Patient is accompained by: Family member   Pertinent History see Epic, hx of back surgery   Patient Stated Goals improve use of LUE   Currently in Pain? No/denies        Neuro re-ed:  PWR! Moves (up, rock, step) in sitting x 10 each with min cues For incr movement amplitude.  Practiced technique for supine>sitting with use of large amplitude movements and instructed in rationale, Emphasized importance of posture and good base of support to assist with UE rigidity/reaching and balance.  Pt/caregiver verbalized understanding.                          OT Education - 02/13/16 1614    Education Details Supine PWR! moves (basic 4) and purpose of ex. to help with function   Person(s) Educated Patient   Methods Explanation;Demonstration;Handout;Verbal cues;Tactile cues   Comprehension Verbalized understanding;Returned demonstration;Verbal cues required  min-mod cueing initially, improved with repetition          OT Short Term Goals - 01/30/16 1631      OT SHORT TERM GOAL #1   Title I with PD specific HEP. due 02/29/16   Time 4   Period Weeks   Status New     OT SHORT TERM GOAL #2   Title Pt will verbalize understanding of adapted strateiges for ADLs/IADLs.  Time 4   Period Weeks   Status New     OT SHORT TERM GOAL #3   Title Pt will demonstrate ability to retrieve a lightweight object at 115 shoulder flexion , and -10 elbow extension with LUE with pain less than or equal to 2/10.   Time 4   Period Weeks   Status New     OT SHORT TERM GOAL #4   Title Pt will demonstrate improved fine motor coordination as evidenced by decreasing LUE 9 hole peg test score to 36 secs or less.   Baseline RUE: 28.47 secs, LUE 41.19 secs   Time 8   Period Weeks   Status New           OT Long Term Goals - 01/30/16 1633      OT LONG TERM GOAL #1   Title Pt will verbalize understanding of ways to keep thinking skills sharp, memory compensations PRN..    Time 8   Period Weeks   Status New     OT LONG TERM GOAL #2   Title Pt will verbalize understanding of community resources and ways to prevent future PD- related complications.   Time 8   Period Weeks   Status New     OT LONG TERM GOAL #3   Title Pt will decrease 3 button/ unbutton score to 40 secs or less.   Baseline 48.28   Time 8   Period Weeks   Status New     OT LONG TERM GOAL #4   Title Pt will  demonstrate improved ease with dressing as evidenced by performing PPT# 4(don/doff jacket) in 24 secs or less   Baseline 28.91 secs   Time 8   Period Weeks   Status New               Plan - 02/13/16 1438    Clinical Impression Statement Pt responded well to cueing for incr movement amplitude.  Pt demo improved posture and improved L shoulder flexibility by end of session.   Rehab Potential Good   OT Frequency 2x / week   OT Duration 8 weeks   OT Treatment/Interventions Self-care/ADL training;Moist Heat;Fluidtherapy;DME and/or AE instruction;Patient/family education;Balance training;Therapeutic exercises;Ultrasound;Therapeutic exercise;Therapeutic activities;Passive range of motion;Cognitive remediation/compensation;Functional Mobility Training;Neuromuscular education;Parrafin;Energy conservation;Manual Therapy;Visual/perceptual remediation/compensation   Plan review supine PWR!, PWR! hands (up), coordination HEP    OT Home Exercise Plan Education provided:  PWR! sitting (up, rock, step); PWR! supine (basic 4)   Consulted and Agree with Plan of Care Patient      Patient will benefit from skilled therapeutic intervention in order to improve the following deficits and impairments:  Abnormal gait, Decreased coordination, Decreased range of motion, Difficulty walking, Impaired flexibility, Decreased safety awareness, Decreased endurance, Decreased activity tolerance, Decreased knowledge of precautions, Impaired tone, Impaired UE functional use, Pain, Decreased knowledge of use  of DME, Decreased balance, Decreased mobility, Decreased strength, Impaired perceived functional ability, Impaired vision/preception  Visit Diagnosis: Other lack of coordination  Other symptoms and signs involving the musculoskeletal system  Left shoulder pain, unspecified chronicity  Other symptoms and signs involving the nervous system  Other abnormalities of gait and mobility  Stiffness of joint, shoulder region, left  Unsteadiness on feet    Problem List Patient Active Problem List   Diagnosis Date Noted  . PD (Parkinson's disease) ([place]) 12/18/2015  . Tremor of both hands 11/22/2015  . PAD (peripheral artery disease) s/p remote left external iliac artery angioplasty 10/28/2012  . Coronary atherosclerosis 10/28/2012  .  Hyperlipidemia 10/28/2012  . HTN (hypertension) 10/28/2012  . DM2 (diabetes mellitus, type 2) ([place]) 10/28/2012  . Carotid occlusion, right 10/28/2012    [facility] 02/13/2016, 4:38 PM  [facility] [address] [place], [place], [address] Phone: [phone]   Fax:  [phone]  Name: TOMASI GARICA MRN: [id number] Date of Birth: Oct 24, 1945   Vianne Bulls, OTR/L [facility] [address] [place], [place]  [address] [phone] phone [phone] 02/13/16 4:38 PM

## 2016-02-13 NOTE — Patient Instructions (Addendum)
   Five LOUD "ah" twice a day (feel it from the bottom of your chair)  Five "ah" -low to high pitch  (regular volume), twice a day  You can also do 5 high to low pitch with your others, if you desire.

## 2016-02-15 ENCOUNTER — Ambulatory Visit: Payer: Medicare Other | Admitting: Occupational Therapy

## 2016-02-15 ENCOUNTER — Ambulatory Visit: Payer: Medicare Other | Admitting: Physical Therapy

## 2016-02-15 ENCOUNTER — Ambulatory Visit: Payer: Medicare Other | Admitting: *Deleted

## 2016-02-15 DIAGNOSIS — R2689 Other abnormalities of gait and mobility: Secondary | ICD-10-CM

## 2016-02-15 DIAGNOSIS — M25512 Pain in left shoulder: Secondary | ICD-10-CM

## 2016-02-15 DIAGNOSIS — R278 Other lack of coordination: Secondary | ICD-10-CM

## 2016-02-15 DIAGNOSIS — R471 Dysarthria and anarthria: Secondary | ICD-10-CM | POA: Diagnosis not present

## 2016-02-15 DIAGNOSIS — R29818 Other symptoms and signs involving the nervous system: Secondary | ICD-10-CM

## 2016-02-15 DIAGNOSIS — R2681 Unsteadiness on feet: Secondary | ICD-10-CM

## 2016-02-15 DIAGNOSIS — R293 Abnormal posture: Secondary | ICD-10-CM | POA: Diagnosis not present

## 2016-02-15 DIAGNOSIS — R29898 Other symptoms and signs involving the musculoskeletal system: Secondary | ICD-10-CM | POA: Diagnosis not present

## 2016-02-15 DIAGNOSIS — M25612 Stiffness of left shoulder, not elsewhere classified: Secondary | ICD-10-CM | POA: Diagnosis not present

## 2016-02-15 NOTE — Therapy (Signed)
[facility] [address] [place], [place], [address] Phone: [phone]   Fax:  [phone]  Occupational Therapy Treatment  Patient Details  Name: Rodney Love MRN: [id number] Date of Birth: [birth year] Referring Provider: Dr. Carles Collet  Encounter Date: 02/15/2016      OT End of Session - 02/15/16 1411    Visit Number 4   Number of Visits 17   Date for OT Re-Evaluation 03/29/16   Authorization Type UHC Medicare   Authorization - Visit Number 4   Authorization - Number of Visits 10   OT Start Time 1404   OT Stop Time 1445   OT Time Calculation (min) 41 min   Activity Tolerance Patient tolerated treatment well   Behavior During Therapy [facility] for tasks assessed/performed      Past Medical History:  Diagnosis Date  . Arthritis   . Constipation    OCCASIONAL  . Coronary artery disease   . Diabetes mellitus without complication ([place])   . Environmental allergies   . Hyperlipidemia   . Hypertension   . Inguinal hernia   . Nocturia   . Peripheral arterial disease [facility])     Past Surgical History:  Procedure Laterality Date  . BACK SURGERY    . CARDIAC CATHETERIZATION  05/29/2006   single vessel CAD w/moderate stenosis of the prox. RCA approx 40%,prox. LAD 20-30%,minor irreg. mid abdominal aorta,left iliac artery disease 50-60%  . CATARACT EXTRACTION Right   . HERNIA REPAIR  [id number]  . INGUINAL HERNIA REPAIR N/A 09/23/2012   Procedure: LAPAROSCOPIC INGUINAL HERNIA with umbilical hernia;  Surgeon: Madilyn Hook, DO;  [facility];  Service: General;  Laterality: N/A;  . INSERTION OF MESH Bilateral 09/23/2012   Procedure: INSERTION OF MESH;  Surgeon: Madilyn Hook, DO;  [facility];  Service: General;  Laterality: Bilateral;    There were no vitals filed for this visit.      Subjective Assessment - 02/15/16 1411    Pertinent History see Epic, hx of back surgery   Patient Stated Goals improve use of LUE   Currently in Pain? No/denies          Reviewed coordination activities from HEP with bilateral UE's, : flipping, dealing cards, picking up, stacking and pushing coins out of hand, min-mod v.c. For larger movements and finger ext.               PWR [facility]) - 02/15/16 1443    PWR! exercises Moves in supine   PWR! Up x10   PWR! Rock [employer] Twist x20   PWR! Step x10   Comments min v.c. for larger amplitude movments and demonstration               OT Short Term Goals - 01/30/16 1631      OT SHORT TERM GOAL #1   Title I with PD specific HEP. due 02/29/16   Time 4   Period Weeks   Status New     OT SHORT TERM GOAL #2   Title Pt will verbalize understanding of adapted strateiges for ADLs/IADLs.   Time 4   Period Weeks   Status New     OT SHORT TERM GOAL #3   Title Pt will demonstrate ability to retrieve a lightweight object at 115 shoulder flexion , and -10 elbow extension with LUE with pain less than or equal to 2/10.   Time 4   Period Weeks   Status New  OT SHORT TERM GOAL #4   Title Pt will demonstrate improved fine motor coordination as evidenced by decreasing LUE 9 hole peg test score to 36 secs or less.   Baseline RUE: 28.47 secs, LUE 41.19 secs   Time 8   Period Weeks   Status New           OT Long Term Goals - 01/30/16 1633      OT LONG TERM GOAL #1   Title Pt will verbalize understanding of ways to keep thinking skills sharp, memory compensations PRN..   Time 8   Period Weeks   Status New     OT LONG TERM GOAL #2   Title Pt will verbalize understanding of community resources and ways to prevent future PD- related complications.   Time 8   Period Weeks   Status New     OT LONG TERM GOAL #3   Title Pt will decrease 3 button/ unbutton score to 40 secs or less.   Baseline 48.28   Time 8   Period Weeks   Status New     OT LONG TERM GOAL #4   Title Pt will  demonstrate improved ease with dressing as evidenced by performing  PPT# 4(don/doff jacket) in 24 secs or less   Baseline 28.91 secs   Time 8   Period Weeks   Status New               Plan - 02/15/16 1430    Clinical Impression Statement Pt is progressing towards goals. He verbalizes understanding of PWR! basic 4 in supine and coordination HEP after review.   Rehab Potential Good   OT Frequency 2x / week   OT Duration 8 weeks   OT Treatment/Interventions Self-care/ADL training;Moist Heat;Fluidtherapy;DME and/or AE instruction;Patient/family education;Balance training;Therapeutic exercises;Ultrasound;Therapeutic exercise;Therapeutic activities;Passive range of motion;Cognitive remediation/compensation;Functional Mobility Training;Neuromuscular education;Parrafin;Energy conservation;Manual Therapy;Visual/perceptual remediation/compensation   Plan progress HEP, consider modified quadraped!(hx of back surgery), adapted strategies for ADLs   OT Home Exercise Plan Education provided:  PWR! sitting (up, rock, step); PWR! supine (basic 4),coordination    Consulted and Agree with Plan of Care Patient      Patient will benefit from skilled therapeutic intervention in order to improve the following deficits and impairments:  Abnormal gait, Decreased coordination, Decreased range of motion, Difficulty walking, Impaired flexibility, Decreased safety awareness, Decreased endurance, Decreased activity tolerance, Decreased knowledge of precautions, Impaired tone, Impaired UE functional use, Pain, Decreased knowledge of use of DME, Decreased balance, Decreased mobility, Decreased strength, Impaired perceived functional ability, Impaired vision/preception  Visit Diagnosis: Other lack of coordination  Other symptoms and signs involving the musculoskeletal system  Left shoulder pain, unspecified chronicity  Other symptoms and signs involving the nervous system    Problem List Patient Active Problem List   Diagnosis Date Noted  . PD (Parkinson's disease) ([place])  12/18/2015  . Tremor of both hands 11/22/2015  . PAD (peripheral artery disease) s/p remote left external iliac artery angioplasty 10/28/2012  . Coronary atherosclerosis 10/28/2012  . Hyperlipidemia 10/28/2012  . HTN (hypertension) 10/28/2012  . DM2 (diabetes mellitus, type 2) ([place]) 10/28/2012  . Carotid occlusion, right 10/28/2012    Emmalise Huard 02/15/2016, 6:20 PM  [facility] [address] [place], [place], [address] Phone: [phone]   Fax:  [phone]  Name: DAKEEM OLSHANSKY MRN: [id number] Date of Birth: 19-Aug-1945

## 2016-02-15 NOTE — Patient Instructions (Signed)
Search on computer for "You tube PWR!" and watch videos that demonstrate the different exercises we have given you (seated, standing, supine)    Opt[employer] Fitness Program after Therapy for People with Parkinson's Disease  1)  Therapy Home Exercise Program  -Do these Exercises DAILY as instructed by your therapist  -Big, deliberate effort with exercises  -These exercises are important to perform consistently, even when therapist has finished, because these therapy exercises often address your specific  Parkinson's difficulties   2)  Walking  - Work up to walking 3-5 times per week, 20-30 minutes per day  -This can be done at home, driveway, quiet street or an indoor track  -Focus should be on your Best posture, arm swing, step length for your best  walking pattern  3)  Aerobic Exercise  -Work up to 3-5 times per week, 30 minutes per day  -This can be stationary bike, seated stepper machine, elliptical machine  -Work up to 7-8/10 intensity during the exercise, at minimal to moderate     Resistance

## 2016-02-15 NOTE — Patient Instructions (Signed)
Breath Support for Effective Communication  1. Lungs are like air in tires or gas in gas tank - you won't go far if they are empty.  2. Clavicular breathing is shallow - ribs get get in the way!  3. Diaphragmatic breathing is deep  - lungs expand downward.  4. Lie down when doing your breathing exercises - better visual feedback  5. Breathe in through the nose, out through the mouth  6. Place something small (napkin, book) on your chest and on your stomach.  Focus on making each move individually (chest, stomach) to get an idea of what it feels like, and what the differences are  7. Then focus on moving only your stomach area (indicating diaphragmatic/deep breathing)  8.  Breathe in for a count of 2  Hold breath for count of 2  Breath out for a count of 2  Hold for a count of 2  9. Gradually increase to 3 breaths, then 4. It may take weeks of practice, it may take days. Go at [place]. Don't worry if you fall asleep during breathing/relaxation exercises. That means you're doing it right!!  11. When up and walking around and communicating, pay attention to what's moving (chest or stomach)

## 2016-02-15 NOTE — Therapy (Signed)
[facility] [address] [place], [place], [address] Phone: [phone]   Fax:  [phone]  Speech Language Pathology Treatment  Patient Details  Name: RAYSHAWN ROSATO MRN: [id number] Date of Birth: [birth year] Referring Provider: Dr. Wells Guiles Tat  Encounter Date: 02/15/2016      End of Session - 02/15/16 1604    Visit Number 3   Number of Visits 17   Date for SLP Re-Evaluation 03/28/16   SLP Start Time 1450   SLP Stop Time  1535   SLP Time Calculation (min) 45 min   Activity Tolerance Patient tolerated treatment well      Past Medical History:  Diagnosis Date  . Arthritis   . Constipation    OCCASIONAL  . Coronary artery disease   . Diabetes mellitus without complication ([place])   . Environmental allergies   . Hyperlipidemia   . Hypertension   . Inguinal hernia   . Nocturia   . Peripheral arterial disease [facility])     Past Surgical History:  Procedure Laterality Date  . BACK SURGERY    . CARDIAC CATHETERIZATION  05/29/2006   single vessel CAD w/moderate stenosis of the prox. RCA approx 40%,prox. LAD 20-30%,minor irreg. mid abdominal aorta,left iliac artery disease 50-60%  . CATARACT EXTRACTION Right   . HERNIA REPAIR  [id number]  . INGUINAL HERNIA REPAIR N/A 09/23/2012   Procedure: LAPAROSCOPIC INGUINAL HERNIA with umbilical hernia;  Surgeon: Madilyn Hook, DO;  [facility];  Service: General;  Laterality: N/A;  . INSERTION OF MESH Bilateral 09/23/2012   Procedure: INSERTION OF MESH;  Surgeon: Madilyn Hook, DO;  [facility];  Service: General;  Laterality: Bilateral;    There were no vitals filed for this visit.      Subjective Assessment - 02/15/16 1557    Subjective I'm working on increasing my loudness and deep breathing   Patient is accompained by: Family member   Special Tests daughter, [name]   Currently in Pain? No/denies               ADULT SLP TREATMENT - 02/15/16 0001      General Information   Behavior/Cognition Alert;Cooperative;Pleasant mood     Treatment Provided   Treatment provided Cognitive-Linquistic     Pain Assessment   Pain Assessment No/denies pain     Cognitive-Linquistic Treatment   Treatment focused on Dysarthria;Patient/family/caregiver education   Skilled Treatment Pt was seen for skilled ST session targeting goals for increased loudness. Pt and daughter were present during session where SLP discussed proper diaphragmatic breathing vs clavicular (shallow) breathing, breath support and control, increasing awareness of breathing exercises and methods. Pt was also given written suggestions on safe swallowing with Parkinson's Disease, to mitigate aspiration risk as Parkinson's progresses.      Assessment / Recommendations / Plan   Plan Continue with current plan of care     Progression Toward Goals   Progression toward goals Progressing toward goals          SLP Education - 02/15/16 1602    Education provided Yes   Education Details Breath support for effective communication and increased loudness   Person(s) Educated Patient;Child(ren)   Methods Explanation;Demonstration;Handout   Comprehension Verbalized understanding;Returned demonstration;Need further instruction          SLP Short Term Goals - 02/15/16 1609      SLP SHORT TERM GOAL #1   Title Pt will average 90dB on loud /a/ with rare min A over 3 sessions  Time 4   Period Weeks   Status On-going     SLP SHORT TERM GOAL #2   Title Pt will average 70dB on 85% of  sentence level structured speech tasks with occasional min A.   Time 4   Period Weeks   Status On-going     SLP SHORT TERM GOAL #3   Title Pt will maintain average of 70dB during 8 minute simple conversation with occasional min A   Time 4   Period Weeks   Status On-going          SLP Long Term Goals - 02/15/16 1609      SLP LONG TERM GOAL #1   Title Pt will maintain average of 70dB during 15 minute  moderately complex conversation with rare min A   Time 8   Period Weeks   Status On-going     SLP LONG TERM GOAL #2   Title Pt. will maintain audible conversation over 12 minutes in noisy environment outside of therapy room.   Time 8   Period Weeks          Plan - 02/15/16 1605    Clinical Impression Statement Pt participated in exercises practicing deep/diaphragmatic breathing, increasing awareness of breath support and control, likening it to air in car tires. Pt was given written suggestions for successful breathing exercise hierarchy, and verbalized understanding of information. Daughter also indicated understanding. Recommend continued ST focusing on increasing volume and breath support for functional effective verbal communication.   Speech Therapy Frequency 2x / week   Treatment/Interventions Compensatory strategies;Patient/family education;Functional tasks;Environmental controls;SLP instruction and feedback   Potential to [place] breathing/relaxation exercises   Consulted and Agree with Plan of Care Patient;Family member/caregiver   Family Member Consulted daughter [name]      Patient will benefit from skilled therapeutic intervention in order to improve the following deficits and impairments:   Dysarthria and anarthria    Problem List Patient Active Problem List   Diagnosis Date Noted  . PD (Parkinson's disease) ([place]) 12/18/2015  . Tremor of both hands 11/22/2015  . PAD (peripheral artery disease) s/p remote left external iliac artery angioplasty 10/28/2012  . Coronary atherosclerosis 10/28/2012  . Hyperlipidemia 10/28/2012  . HTN (hypertension) 10/28/2012  . DM2 (diabetes mellitus, type 2) ([place]) 10/28/2012  . Carotid occlusion, right 10/28/2012   Meesha Sek B. [place], MSP, CCC-SLP  Shonna Chock 02/15/2016, 4:10 PM  [facility] [address] [place], [place],  [address] Phone: [phone]   Fax:  [phone]   Name: SAMARTH STORMER MRN: [id number] Date of Birth: Feb 22, 1946

## 2016-02-15 NOTE — Therapy (Signed)
[facility] [address] [place], [place], [address] Phone: [phone]   Fax:  [phone]  Physical Therapy Treatment  Patient Details  Name: Rodney Love MRN: [id number] Date of Birth: Jan 28, 1946 Referring Provider: Wells Guiles Tat  Encounter Date: 02/15/2016      PT End of Session - 02/15/16 1458    Visit Number 2   Number of Visits 17   Date for PT Re-Evaluation 03/30/16   Authorization Type UHC Medicare-GCODE every 10th visit   PT Start Time 1318   PT Stop Time 1400   PT Time Calculation (min) 42 min   Activity Tolerance Patient tolerated treatment well   Behavior During Therapy [facility] for tasks assessed/performed      Past Medical History:  Diagnosis Date  . Arthritis   . Constipation    OCCASIONAL  . Coronary artery disease   . Diabetes mellitus without complication ([place])   . Environmental allergies   . Hyperlipidemia   . Hypertension   . Inguinal hernia   . Nocturia   . Peripheral arterial disease [facility])     Past Surgical History:  Procedure Laterality Date  . BACK SURGERY    . CARDIAC CATHETERIZATION  05/29/2006   single vessel CAD w/moderate stenosis of the prox. RCA approx 40%,prox. LAD 20-30%,minor irreg. mid abdominal aorta,left iliac artery disease 50-60%  . CATARACT EXTRACTION Right   . HERNIA REPAIR  [id number]  . INGUINAL HERNIA REPAIR N/A 09/23/2012   Procedure: LAPAROSCOPIC INGUINAL HERNIA with umbilical hernia;  Surgeon: Madilyn Hook, DO;  Location: [facility];  Service: General;  Laterality: N/A;  . INSERTION OF MESH Bilateral 09/23/2012   Procedure: INSERTION OF MESH;  Surgeon: Madilyn Hook, DO;  Location: [facility];  Service: General;  Laterality: Bilateral;    There were no vitals filed for this visit.      Subjective Assessment - 02/15/16 1322    Subjective Denies falls or changes.   Patient is accompained by: Family member  daughter   Pertinent History Ruptured disc in low back   Patient Stated Goals Pt's goals for therapy is to improve walking and balance.   Currently in Pain? No/denies           [facility] Adult PT Treatment/Exercise - 02/15/16 0001      Self-Care   Self-Care Other Self-Care Comments   Other Self-Care Comments  Discussed optimal community fitness upon discharge, foot pedaler for home use and viewing PWR! moves instructional video on You tube as reinforcement.     Exercises   Exercises Knee/Hip     Knee/Hip Exercises: Stretches   Active Hamstring Stretch Both;2 reps;30 seconds  seated edge of mat with foot on stool-cues for technique     Knee/Hip Exercises: Aerobic   Other Aerobic Seated foot pedaler x 1 minute from chair for back support;Discussed purchasing foot pedaler for home use.           PWR [facility]) - 02/15/16 1443    PWR! exercises Moves in supine   PWR! Up x10   PWR! Rock [employer]! Twist x20   PWR! Step x10   Comments min              PT Education - 02/15/16 1457    Education provided Yes   Education Details Standing PWR!, optimal community fitness, foot pedaler for home use, You tube PWR! moves for visual cues at home   Person(s) Educated Patient;Child(ren)   Methods Explanation;Demonstration;Handout   Comprehension  Verbalized understanding;Returned demonstration;Need further instruction          PT Short Term Goals - 01/30/16 1416      PT SHORT TERM GOAL #1   Title Pt will be independent with HEP for improved posture, transfers, balance and gait.  TARGET 02/19/16   Time 4   Period Weeks   Status New     PT SHORT TERM GOAL #2   Title Pt will improve 5x sit<>stand transfers to less than or equal to 15 seconds for improved efficiency and safety of transfers.   Time 4   Period Weeks   Status New     PT SHORT TERM GOAL #3   Title Pt will improve TUG score to less than or equal to 13.5 seconds for decreased fall risk.   Time 4   Period Weeks   Status New     PT SHORT TERM GOAL #4   Title Pt will  improve SLS to at least 3 seconds each leg, for improved obstacle and step negotiation.   Time 4   Period Weeks   Status New     PT SHORT TERM GOAL #5   Title Pt will verbalize understanding of local Parkinson's disease resources.   Time 4   Period Weeks   Status New           PT Long Term Goals - 01/30/16 1419      PT LONG TERM GOAL #1   Title Pt will verbalize understanding of fall prevention in home environment.  TARGET 03/30/16   Time 8   Period Weeks   Status New     PT LONG TERM GOAL #2   Title Pt will improve 5x sit<>stand to less than or equal to 11.5 seconds for improved transfer efficiency and safety.   Time 8   Period Weeks   Status New     PT LONG TERM GOAL #3   Title Pt will improve TUG cognitive score to less than or equal to 15 seconds for decreased fall risk.   Time 8   Period Weeks   Status New     PT LONG TERM GOAL #4   Title Pt will improve MiniBESTest to at least 24/28 for decreased fall risk.   Time 8   Period Weeks   Status New     PT LONG TERM GOAL #5   Title Pt will verbalize plans for continued community fitness upon D/C from PT.   Time 8   Period Weeks   Status New               Plan - 02/15/16 1458    Clinical Impression Statement Initiated PWR! Standing today.  Needs constant cues during session.  Continue PT per POC.   Rehab Potential Good   PT Frequency 2x / week   PT Duration 8 weeks  plus eval   PT Treatment/Interventions ADLs/Self Care Home Management;Functional mobility training;Gait training;Therapeutic activities;Therapeutic exercise;Balance training;Neuromuscular re-education;Patient/family education   PT Next Visit Plan Review standing PWR! moves;work on transfers with progressively lower surfaces,   Consulted and Agree with Plan of Care Patient;Family member/caregiver   Family Member Consulted daughter      Patient will benefit from skilled therapeutic intervention in order to improve the following deficits  and impairments:  Abnormal gait, Decreased balance, Decreased mobility, Decreased strength, Difficulty walking, Impaired flexibility, Postural dysfunction  Visit Diagnosis: Other symptoms and signs involving the nervous system  Other abnormalities of gait and mobility  Abnormal posture  Unsteadiness on feet     Problem List Patient Active Problem List   Diagnosis Date Noted  . PD (Parkinson's disease) ([place]) 12/18/2015  . Tremor of both hands 11/22/2015  . PAD (peripheral artery disease) s/p remote left external iliac artery angioplasty 10/28/2012  . Coronary atherosclerosis 10/28/2012  . Hyperlipidemia 10/28/2012  . HTN (hypertension) 10/28/2012  . DM2 (diabetes mellitus, type 2) ([place]) 10/28/2012  . Carotid occlusion, right 10/28/2012    Narda Bonds 02/15/2016, 3:00 PM  [facility] [address] [place], [place], [address] Phone: [phone]   Fax:  [phone]  Name: Rodney Love MRN: [id number] Date of Birth: 1945/07/27  Narda Bonds, [place] 02/15/16 3:01 PM Phone: [phone] Fax: [phone]

## 2016-02-19 ENCOUNTER — Ambulatory Visit: Payer: Medicare Other | Admitting: Occupational Therapy

## 2016-02-19 ENCOUNTER — Ambulatory Visit: Payer: Medicare Other | Admitting: Physical Therapy

## 2016-02-19 DIAGNOSIS — R2681 Unsteadiness on feet: Secondary | ICD-10-CM

## 2016-02-19 DIAGNOSIS — R293 Abnormal posture: Secondary | ICD-10-CM | POA: Diagnosis not present

## 2016-02-19 DIAGNOSIS — M25512 Pain in left shoulder: Secondary | ICD-10-CM | POA: Diagnosis not present

## 2016-02-19 DIAGNOSIS — R29898 Other symptoms and signs involving the musculoskeletal system: Secondary | ICD-10-CM | POA: Diagnosis not present

## 2016-02-19 DIAGNOSIS — R471 Dysarthria and anarthria: Secondary | ICD-10-CM | POA: Diagnosis not present

## 2016-02-19 DIAGNOSIS — M25612 Stiffness of left shoulder, not elsewhere classified: Secondary | ICD-10-CM | POA: Diagnosis not present

## 2016-02-19 DIAGNOSIS — R29818 Other symptoms and signs involving the nervous system: Secondary | ICD-10-CM

## 2016-02-19 DIAGNOSIS — R278 Other lack of coordination: Secondary | ICD-10-CM

## 2016-02-19 DIAGNOSIS — R2689 Other abnormalities of gait and mobility: Secondary | ICD-10-CM

## 2016-02-19 NOTE — Therapy (Signed)
[facility] [address] [place], [place], [address] Phone: [phone]   Fax:  [phone]  Occupational Therapy Treatment  Patient Details  Name: Rodney Love MRN: [id number] Date of Birth: 1945/09/11 Referring Provider: Dr. Carles Collet  Encounter Date: 02/19/2016      OT End of Session - 02/19/16 1320    Visit Number 5   Number of Visits 17   Date for OT Re-Evaluation 03/29/16   Authorization Type UHC Medicare   Authorization - Visit Number 5   Authorization - Number of Visits 10   OT Start Time 1318   OT Stop Time 1400   OT Time Calculation (min) 42 min   Activity Tolerance Patient tolerated treatment well   Behavior During Therapy [facility] for tasks assessed/performed      Past Medical History:  Diagnosis Date  . Arthritis   . Constipation    OCCASIONAL  . Coronary artery disease   . Diabetes mellitus without complication ([place])   . Environmental allergies   . Hyperlipidemia   . Hypertension   . Inguinal hernia   . Nocturia   . Peripheral arterial disease [facility])     Past Surgical History:  Procedure Laterality Date  . BACK SURGERY    . CARDIAC CATHETERIZATION  05/29/2006   single vessel CAD w/moderate stenosis of the prox. RCA approx 40%,prox. LAD 20-30%,minor irreg. mid abdominal aorta,left iliac artery disease 50-60%  . CATARACT EXTRACTION Right   . HERNIA REPAIR  [id number]  . INGUINAL HERNIA REPAIR N/A 09/23/2012   Procedure: LAPAROSCOPIC INGUINAL HERNIA with umbilical hernia;  Surgeon: Madilyn Hook, DO;  [facility];  Service: General;  Laterality: N/A;  . INSERTION OF MESH Bilateral 09/23/2012   Procedure: INSERTION OF MESH;  Surgeon: Madilyn Hook, DO;  [facility]: [facility];  Service: General;  Laterality: Bilateral;    There were no vitals filed for this visit.      Subjective Assessment - 02/19/16 1319    Subjective  "I'm trying to walk and do some exercises everyday"   Patient is accompained by:  Family member   Pertinent History see Epic, hx of back surgery   Patient Stated Goals improve use of LUE   Currently in Pain? No/denies        Neuro re-ed:  PWR! Moves (basic 4) in modified quadraped x 10-20 each with min-mod cues For incr movement amplitude, sequence/performance.  Arm bike x 70min level 1 for reciprocal movement with cues/target of at least 40rpms for intensity while maintaining movement amplitude/reciprocal movement.   Pt maintained 38-45 rpms (decr only with distractions).   Flipping cards with mod cues for incr movement amplitude (finger abduction/extension) with each hand, improved with repetition/cues.  Dealing cards with thumb with each hand with min-mod cues for incr movement amplitude.  Practicing donning jacket with incr movement amplitude.  Recommended twisting when donning and doffing.  (Trial of donning like a cape and donning LUE in first; however, pt with incr ease donning with RUE in first and then pulling collar around to other side).  Practiced fastening buttons on pt's shirt and on tabletop with min cues to use both hands (tends to use RUE only) and for PWR! Hands and deliberate thumb movement.  Pt with improved performance using strategies.    Recommended pt hold spoon/fork in middle of handle vs. End (and demonstrated).  Pt verbalized understanding.  Began education regarding use of large amplitude movement strategies to prevent future complications.  Pt verbalized understanding.  OT Short Term Goals - 01/30/16 1631      OT SHORT TERM GOAL #1   Title I with PD specific HEP. due 02/29/16   Time 4   Period Weeks   Status New     OT SHORT TERM GOAL #2   Title Pt will verbalize understanding of adapted strateiges for ADLs/IADLs.   Time 4   Period Weeks   Status New     OT SHORT TERM GOAL #3   Title Pt will demonstrate ability to retrieve a lightweight object at 115 shoulder flexion , and -10 elbow  extension with LUE with pain less than or equal to 2/10.   Time 4   Period Weeks   Status New     OT SHORT TERM GOAL #4   Title Pt will demonstrate improved fine motor coordination as evidenced by decreasing LUE 9 hole peg test score to 36 secs or less.   Baseline RUE: 28.47 secs, LUE 41.19 secs   Time 8   Period Weeks   Status New           OT Long Term Goals - 01/30/16 1633      OT LONG TERM GOAL #1   Title Pt will verbalize understanding of ways to keep thinking skills sharp, memory compensations PRN..   Time 8   Period Weeks   Status New     OT LONG TERM GOAL #2   Title Pt will verbalize understanding of community resources and ways to prevent future PD- related complications.   Time 8   Period Weeks   Status New     OT LONG TERM GOAL #3   Title Pt will decrease 3 button/ unbutton score to 40 secs or less.   Baseline 48.28   Time 8   Period Weeks   Status New     OT LONG TERM GOAL #4   Title Pt will  demonstrate improved ease with dressing as evidenced by performing PPT# 4(don/doff jacket) in 24 secs or less   Baseline 28.91 secs   Time 8   Period Weeks   Status New               Plan - 02/19/16 1550    Clinical Impression Statement Pt is progressing towards goals with improved movement amplitude for functional tasks noted.  Pt reports improved L shoulder pain and movement.   Rehab Potential Good   OT Frequency 2x / week   OT Duration 8 weeks   OT Treatment/Interventions Self-care/ADL training;Moist Heat;Fluidtherapy;DME and/or AE instruction;Patient/family education;Balance training;Therapeutic exercises;Ultrasound;Therapeutic exercise;Therapeutic activities;Passive range of motion;Cognitive remediation/compensation;Functional Mobility Training;Neuromuscular education;Parrafin;Energy conservation;Manual Therapy;Visual/perceptual remediation/compensation   Plan review modified quadraped (up, rock, twist); large amplitude movement strategies for ADLs    OT Home Exercise Plan Education provided:  PWR! sitting (up, rock, step); PWR! supine (basic 4),coordination; PWR! quadraped (up, rock, twist)   Consulted and Agree with Plan of Care Patient;Family member/caregiver      Patient will benefit from skilled therapeutic intervention in order to improve the following deficits and impairments:  Abnormal gait, Decreased coordination, Decreased range of motion, Difficulty walking, Impaired flexibility, Decreased safety awareness, Decreased endurance, Decreased activity tolerance, Decreased knowledge of precautions, Impaired tone, Impaired UE functional use, Pain, Decreased knowledge of use of DME, Decreased balance, Decreased mobility, Decreased strength, Impaired perceived functional ability, Impaired vision/preception  Visit Diagnosis: Other symptoms and signs involving the nervous system  Other symptoms and signs involving the musculoskeletal system  Other lack of coordination  Left shoulder pain, unspecified chronicity  Other abnormalities of gait and mobility  Abnormal posture    Problem List Patient Active Problem List   Diagnosis Date Noted  . PD (Parkinson's disease) ([place]) 12/18/2015  . Tremor of both hands 11/22/2015  . PAD (peripheral artery disease) s/p remote left external iliac artery angioplasty 10/28/2012  . Coronary atherosclerosis 10/28/2012  . Hyperlipidemia 10/28/2012  . HTN (hypertension) 10/28/2012  . DM2 (diabetes mellitus, type 2) ([place]) 10/28/2012  . Carotid occlusion, right 10/28/2012    [facility] 02/19/2016, 3:58 PM  [facility] [address] [place], [place], [address] Phone: [phone]   Fax:  [phone]  Name: EITO OLSAVSKY MRN: [id number] Date of Birth: 08-06-1945   Vianne Bulls, OTR/L [facility] [address] [place], [place]  [address] [phone] phone [phone] 02/19/16 3:58 PM

## 2016-02-19 NOTE — Therapy (Signed)
[facility] [address] [place], [place], [address] Phone: [phone]   Fax:  [phone]  Physical Therapy Treatment  Patient Details  Name: Rodney Love MRN: [id number] Date of Birth: May 29, 1945 Referring Provider: Wells Guiles Tat  Encounter Date: 02/19/2016      PT End of Session - 02/19/16 1459    Visit Number 3   Number of Visits 17   Date for PT Re-Evaluation 03/30/16   Authorization Type UHC Medicare-GCODE every 10th visit   PT Start Time 1402   PT Stop Time 1445   PT Time Calculation (min) 43 min   Activity Tolerance Patient tolerated treatment well   Behavior During Therapy [facility] for tasks assessed/performed      Past Medical History:  Diagnosis Date  . Arthritis   . Constipation    OCCASIONAL  . Coronary artery disease   . Diabetes mellitus without complication ([place])   . Environmental allergies   . Hyperlipidemia   . Hypertension   . Inguinal hernia   . Nocturia   . Peripheral arterial disease [facility])     Past Surgical History:  Procedure Laterality Date  . BACK SURGERY    . CARDIAC CATHETERIZATION  05/29/2006   single vessel CAD w/moderate stenosis of the prox. RCA approx 40%,prox. LAD 20-30%,minor irreg. mid abdominal aorta,left iliac artery disease 50-60%  . CATARACT EXTRACTION Right   . HERNIA REPAIR  [id number]  . INGUINAL HERNIA REPAIR N/A 09/23/2012   Procedure: LAPAROSCOPIC INGUINAL HERNIA with umbilical hernia;  Surgeon: Madilyn Hook, DO;  [facility];  Service: General;  Laterality: N/A;  . INSERTION OF MESH Bilateral 09/23/2012   Procedure: INSERTION OF MESH;  Surgeon: Madilyn Hook, DO;  [facility];  Service: General;  Laterality: Bilateral;    There were no vitals filed for this visit.      Subjective Assessment - 02/19/16 1408    Subjective Denies falls or changes.  Did not practice supine PWR! because bed too soft.   Patient is accompained by: Family member  daughter    Pertinent History Ruptured disc in low back   Patient Stated Goals Pt's goals for therapy is to improve walking and balance.   Currently in Pain? No/denies             [facility] Adult PT Treatment/Exercise - 02/19/16 0001      Transfers   Transfers Sit to Stand;Stand to Sit   Sit to Stand 5: Supervision   Stand to Sit 5: Supervision   Number of Reps 10 reps;Other sets (comment)  3 sets from 20", 18" and simulated couch 16" without UE            PWR [facility]) - 02/19/16 1454    PWR! exercises Moves in sitting;Moves in supine;Moves in standing   PWR! Up 20   PWR! Rock 20   PWR! Twist 20   PWR! Step 20   Comments moderate cues for technique and intensity;knees bent fo rrock and twist   PWR! Up 20   PWR! Rock 20   PWR! Twist 20   PWR Step 20   Comments In standing-moderate cues for technique and intensity   PWR! Up 10   PWR! Rock 10   PWR! Twist 10   PWR! Step 10   Comments In sitting-moderate cues for intensity and technique             PT Education - 02/19/16 1457    Education provided Yes  Education Details PWR! Moves in sitting, standing and supine;transfer training   Person(s) Educated Patient;Child(ren)   Methods Explanation;Demonstration;Handout   Comprehension Verbalized understanding;Returned demonstration          PT Short Term Goals - 01/30/16 1416      PT SHORT TERM GOAL #1   Title Pt will be independent with HEP for improved posture, transfers, balance and gait.  TARGET 02/19/16   Time 4   Period Weeks   Status New     PT SHORT TERM GOAL #2   Title Pt will improve 5x sit<>stand transfers to less than or equal to 15 seconds for improved efficiency and safety of transfers.   Time 4   Period Weeks   Status New     PT SHORT TERM GOAL #3   Title Pt will improve TUG score to less than or equal to 13.5 seconds for decreased fall risk.   Time 4   Period Weeks   Status New     PT SHORT TERM GOAL #4   Title Pt will improve SLS to at least 3  seconds each leg, for improved obstacle and step negotiation.   Time 4   Period Weeks   Status New     PT SHORT TERM GOAL #5   Title Pt will verbalize understanding of local Parkinson's disease resources.   Time 4   Period Weeks   Status New           PT Long Term Goals - 01/30/16 1419      PT LONG TERM GOAL #1   Title Pt will verbalize understanding of fall prevention in home environment.  TARGET 03/30/16   Time 8   Period Weeks   Status New     PT LONG TERM GOAL #2   Title Pt will improve 5x sit<>stand to less than or equal to 11.5 seconds for improved transfer efficiency and safety.   Time 8   Period Weeks   Status New     PT LONG TERM GOAL #3   Title Pt will improve TUG cognitive score to less than or equal to 15 seconds for decreased fall risk.   Time 8   Period Weeks   Status New     PT LONG TERM GOAL #4   Title Pt will improve MiniBESTest to at least 24/28 for decreased fall risk.   Time 8   Period Weeks   Status New     PT LONG TERM GOAL #5   Title Pt will verbalize plans for continued community fitness upon D/C from PT.   Time 8   Period Weeks   Status New               Plan - 02/19/16 1459    Clinical Impression Statement Continues to need cues for intensity and technique for exercises.  Needs constant reinforcement with increased reps also.  Continue PT per POC.   Rehab Potential Good   PT Frequency 2x / week   PT Duration 8 weeks  plus eval   PT Treatment/Interventions ADLs/Self Care Home Management;Functional mobility training;Gait training;Therapeutic activities;Therapeutic exercise;Balance training;Neuromuscular re-education;Patient/family education   PT Next Visit Plan Gait working on armswing and intensity   Consulted and Agree with Plan of Care Patient;Family member/caregiver   Family Member Consulted daughter      Patient will benefit from skilled therapeutic intervention in order to improve the following deficits and  impairments:  Abnormal gait, Decreased balance, Decreased mobility, Decreased strength, Difficulty  walking, Impaired flexibility, Postural dysfunction  Visit Diagnosis: Other symptoms and signs involving the nervous system  Other abnormalities of gait and mobility  Abnormal posture  Unsteadiness on feet     Problem List Patient Active Problem List   Diagnosis Date Noted  . PD (Parkinson's disease) ([place]) 12/18/2015  . Tremor of both hands 11/22/2015  . PAD (peripheral artery disease) s/p remote left external iliac artery angioplasty 10/28/2012  . Coronary atherosclerosis 10/28/2012  . Hyperlipidemia 10/28/2012  . HTN (hypertension) 10/28/2012  . DM2 (diabetes mellitus, type 2) ([place]) 10/28/2012  . Carotid occlusion, right 10/28/2012    Narda Bonds 02/19/2016, 3:01 PM  [facility] [address] [place], [place], [address] Phone: [phone]   Fax:  [phone]  Name: SIGFRED GUERNSEY MRN: [id number] Date of Birth: 28-Aug-1945  Narda Bonds, [place] 02/19/16 3:02 PM Phone: [phone] Fax: [phone]

## 2016-02-20 ENCOUNTER — Ambulatory Visit: Payer: Medicare Other | Admitting: Physical Therapy

## 2016-02-20 ENCOUNTER — Ambulatory Visit: Payer: Medicare Other | Admitting: Occupational Therapy

## 2016-02-20 DIAGNOSIS — M25612 Stiffness of left shoulder, not elsewhere classified: Secondary | ICD-10-CM | POA: Diagnosis not present

## 2016-02-20 DIAGNOSIS — R29818 Other symptoms and signs involving the nervous system: Secondary | ICD-10-CM | POA: Diagnosis not present

## 2016-02-20 DIAGNOSIS — R29898 Other symptoms and signs involving the musculoskeletal system: Secondary | ICD-10-CM

## 2016-02-20 DIAGNOSIS — R278 Other lack of coordination: Secondary | ICD-10-CM | POA: Diagnosis not present

## 2016-02-20 DIAGNOSIS — R471 Dysarthria and anarthria: Secondary | ICD-10-CM | POA: Diagnosis not present

## 2016-02-20 DIAGNOSIS — R2689 Other abnormalities of gait and mobility: Secondary | ICD-10-CM

## 2016-02-20 DIAGNOSIS — M25512 Pain in left shoulder: Secondary | ICD-10-CM | POA: Diagnosis not present

## 2016-02-20 DIAGNOSIS — R293 Abnormal posture: Secondary | ICD-10-CM

## 2016-02-20 DIAGNOSIS — R2681 Unsteadiness on feet: Secondary | ICD-10-CM

## 2016-02-20 NOTE — Therapy (Signed)
[facility] [address] [place], [place], [address] Phone: [phone]   Fax:  [phone]  Occupational Therapy Treatment  Patient Details  Name: Rodney Love MRN: [id number] Date of Birth: 04-24-1945 Referring Provider: Dr. Carles Collet  Encounter Date: 02/20/2016      OT End of Session - 02/20/16 1743    Visit Number 6   Number of Visits 17   Date for OT Re-Evaluation 03/29/16   Authorization Type UHC Medicare   Authorization - Visit Number 6   Authorization - Number of Visits 10   OT Start Time [id number]   OT Stop Time 1400   OT Time Calculation (min) 41 min   Activity Tolerance Patient tolerated treatment well   Behavior During Therapy [facility] for tasks assessed/performed      Past Medical History:  Diagnosis Date  . Arthritis   . Constipation    OCCASIONAL  . Coronary artery disease   . Diabetes mellitus without complication ([place])   . Environmental allergies   . Hyperlipidemia   . Hypertension   . Inguinal hernia   . Nocturia   . Peripheral arterial disease [facility])     Past Surgical History:  Procedure Laterality Date  . BACK SURGERY    . CARDIAC CATHETERIZATION  05/29/2006   single vessel CAD w/moderate stenosis of the prox. RCA approx 40%,prox. LAD 20-30%,minor irreg. mid abdominal aorta,left iliac artery disease 50-60%  . CATARACT EXTRACTION Right   . HERNIA REPAIR  [id number]  . INGUINAL HERNIA REPAIR N/A 09/23/2012   Procedure: LAPAROSCOPIC INGUINAL HERNIA with umbilical hernia;  Surgeon: Madilyn Hook, DO;  Location: [facility];  Service: General;  Laterality: N/A;  . INSERTION OF MESH Bilateral 09/23/2012   Procedure: INSERTION OF MESH;  Surgeon: Madilyn Hook, DO;  Location: [facility];  Service: General;  Laterality: Bilateral;    There were no vitals filed for this visit.      Subjective Assessment - 02/20/16 1334    Subjective  "The exercises really help loosen me up"  Pt reports no shoulder pain   Patient  is accompained by: Family member   Pertinent History see Epic, hx of back surgery   Patient Stated Goals improve use of LUE   Currently in Pain? No/denies      Neuro re-ed:  PWR! Moves (up, rock, twist) in modified quadraped x 10-20 each with min cues For incr movement amplitude (head up, open hand).  Sliding cards off table by using PWR! Hands with focus on finger ext and min cues for incr movement amplitude with each hand.  In standing ,functional reaching in diagonal pattern incorporating trunk rotation/wt. shift and PWR! Reach to grasp/release cylinder objects using PWR! Hands/reach with min cueing for incr movement amplitude  Self Care:    Continued instruction in importance and  in use of large amplitude movements to prevent future complications related to PD.  Pt verbalized understanding.  Dressing:   Simulated ADLs with bag with focus/min cues for large amplitude movements:  Donning/doffing pull-over shirt, donning/doffing pants, pulling bag into hand for clothing adjustment/donning socks, donning belt/pulling shirt down in back.    Also practiced donning shoe/sock using big amplitude movements after instruction.  Demonstrated techniques for donning pull-over shirt (gathering up, turn end out, good posture, big arm movement).  Pt verbalized understanding.    Emphasized importance of opening hand for clothing adjustments.  Pt verbalized understanding.           OT Short Term Goals -  01/30/16 1631      OT SHORT TERM GOAL #1   Title I with PD specific HEP. due 02/29/16   Time 4   Period Weeks   Status New     OT SHORT TERM GOAL #2   Title Pt will verbalize understanding of adapted strateiges for ADLs/IADLs.   Time 4   Period Weeks   Status New     OT SHORT TERM GOAL #3   Title Pt will demonstrate ability to retrieve a lightweight object at 115 shoulder flexion , and -10 elbow extension with LUE with pain less than or equal to 2/10.   Time 4   Period Weeks    Status New     OT SHORT TERM GOAL #4   Title Pt will demonstrate improved fine motor coordination as evidenced by decreasing LUE 9 hole peg test score to 36 secs or less.   Baseline RUE: 28.47 secs, LUE 41.19 secs   Time 8   Period Weeks   Status New           OT Long Term Goals - 01/30/16 1633      OT LONG TERM GOAL #1   Title Pt will verbalize understanding of ways to keep thinking skills sharp, memory compensations PRN..   Time 8   Period Weeks   Status New     OT LONG TERM GOAL #2   Title Pt will verbalize understanding of community resources and ways to prevent future PD- related complications.   Time 8   Period Weeks   Status New     OT LONG TERM GOAL #3   Title Pt will decrease 3 button/ unbutton score to 40 secs or less.   Baseline 48.28   Time 8   Period Weeks   Status New     OT LONG TERM GOAL #4   Title Pt will  demonstrate improved ease with dressing as evidenced by performing PPT# 4(don/doff jacket) in 24 secs or less   Baseline 28.91 secs   Time 8   Period Weeks   Status New               Plan - 02/20/16 1743    Clinical Impression Statement Pt reports no L shoulder pain today and improved ROM (approximating RUE), improved posture, and improved coordination.  Pt is progressing well.   Rehab Potential Good   OT Frequency 2x / week   OT Duration 8 weeks   OT Treatment/Interventions Self-care/ADL training;Moist Heat;Fluidtherapy;DME and/or AE instruction;Patient/family education;Balance training;Therapeutic exercises;Ultrasound;Therapeutic exercise;Therapeutic activities;Passive range of motion;Cognitive remediation/compensation;Functional Mobility Training;Neuromuscular education;Parrafin;Energy conservation;Manual Therapy;Visual/perceptual remediation/compensation   Plan large amplitude movement strategies for ADLs (handout)   OT Home Exercise Plan Education provided:  PWR! sitting (up, rock, step); PWR! supine (basic 4),coordination; PWR!  quadraped (up, rock, twist)   Consulted and Agree with Plan of Care Patient;Family member/caregiver      Patient will benefit from skilled therapeutic intervention in order to improve the following deficits and impairments:  Abnormal gait, Decreased coordination, Decreased range of motion, Difficulty walking, Impaired flexibility, Decreased safety awareness, Decreased endurance, Decreased activity tolerance, Decreased knowledge of precautions, Impaired tone, Impaired UE functional use, Pain, Decreased knowledge of use of DME, Decreased balance, Decreased mobility, Decreased strength, Impaired perceived functional ability, Impaired vision/preception  Visit Diagnosis: Other symptoms and signs involving the nervous system  Other symptoms and signs involving the musculoskeletal system  Other lack of coordination  Unsteadiness on feet  Abnormal posture  Other abnormalities of  gait and mobility  Stiffness of joint, shoulder region, left    Problem List Patient Active Problem List   Diagnosis Date Noted  . PD (Parkinson's disease) ([place]) 12/18/2015  . Tremor of both hands 11/22/2015  . PAD (peripheral artery disease) s/p remote left external iliac artery angioplasty 10/28/2012  . Coronary atherosclerosis 10/28/2012  . Hyperlipidemia 10/28/2012  . HTN (hypertension) 10/28/2012  . DM2 (diabetes mellitus, type 2) ([place]) 10/28/2012  . Carotid occlusion, right 10/28/2012    [facility] 02/20/2016, 5:44 PM  [place] [address] [place], [place], [address] Phone: [phone]   Fax:  [phone]  Name: Rodney Love MRN: [id number] Date of Birth: 12/19/1945   Vianne Bulls, OTR/L [facility] [address] [place], [place]  [address] [phone] phone [phone] 02/20/16 5:53 PM

## 2016-02-20 NOTE — Therapy (Signed)
[place] [address] [place], [place], [address] Phone: [phone]   Fax:  [phone]  Physical Therapy Treatment  Patient Details  Name: Rodney Love MRN: [id number] Date of Birth: [birth year] Referring Provider: Wells Guiles Tat  Encounter Date: 02/20/2016      PT End of Session - 02/20/16 1455    Visit Number 4   Number of Visits 17   Date for PT Re-Evaluation 03/30/16   Authorization Type UHC Medicare-GCODE every 10th visit   PT Start Time 1406   PT Stop Time 1446   PT Time Calculation (min) 40 min   Equipment Utilized During Treatment Gait belt   Activity Tolerance Patient tolerated treatment well   Behavior During Therapy [facility] for tasks assessed/performed      Past Medical History:  Diagnosis Date  . Arthritis   . Constipation    OCCASIONAL  . Coronary artery disease   . Diabetes mellitus without complication ([place])   . Environmental allergies   . Hyperlipidemia   . Hypertension   . Inguinal hernia   . Nocturia   . Peripheral arterial disease [facility])     Past Surgical History:  Procedure Laterality Date  . BACK SURGERY    . CARDIAC CATHETERIZATION  05/29/2006   single vessel CAD w/moderate stenosis of the prox. RCA approx 40%,prox. LAD 20-30%,minor irreg. mid abdominal aorta,left iliac artery disease 50-60%  . CATARACT EXTRACTION Right   . HERNIA REPAIR  [id number]  . INGUINAL HERNIA REPAIR N/A 09/23/2012   Procedure: LAPAROSCOPIC INGUINAL HERNIA with umbilical hernia;  Surgeon: Madilyn Hook, DO;  [facility];  Service: General;  Laterality: N/A;  . INSERTION OF MESH Bilateral 09/23/2012   Procedure: INSERTION OF MESH;  Surgeon: Madilyn Hook, DO;  [facility];  Service: General;  Laterality: Bilateral;    There were no vitals filed for this visit.      Subjective Assessment - 02/20/16 1441    Subjective Says he is not as sore as yesterday.   Patient is accompained by: Family member   daughter   Pertinent History Ruptured disc in low back   Patient Stated Goals Pt's goals for therapy is to improve walking and balance.   Currently in Pain? No/denies           [facility] Adult PT Treatment/Exercise - 02/20/16 0001      Transfers   Transfers Sit to Stand;Stand to Sit   Sit to Stand 5: Supervision   Stand to Sit 5: Supervision     Ambulation/Gait   Ambulation/Gait Yes   Ambulation Distance (Feet) 700 Feet  plus;150' x 2;treadmill also   Assistive device None;Other (Comment)  treadmill   Gait Pattern Step-through pattern;Decreased arm swing - right;Decreased arm swing - left;Decreased step length - left;Narrow base of support   Ambulation Surface Level;Indoor   Gait Comments worked on backwards ambulation with cues for increase step length and supervision;gait on treadmill x 6 minutes with cues for increase step length and heel strike on L.  Gait on ground with pt holding one end of walking poles and PTA holding other end and assisting with arm swing on the L.  Improved cadence, step length and arm swing after instruction.     Neuro Re-ed    Neuro Re-ed Details  Forward, backward and forward<>backward step weight shifts at counter with 1 UE assist     Knee/Hip Exercises: Aerobic   Other Aerobic Scifit level 2.5 all 4 extremities x 8 minutes  with rpm>75 with cues to maintain intensity     Knee/Hip Exercises: Standing   Forward Step Up Both;1 set;15 reps;Hand Hold: 1;Step Height: 6"             PT Education - 02/19/16 1457    Education provided Yes   Education Details PWR! Moves in sitting, standing and supine;transfer training   Person(s) Educated Patient;Child(ren)   Methods Explanation;Demonstration;Handout   Comprehension Verbalized understanding;Returned demonstration          PT Short Term Goals - 01/30/16 1416      PT SHORT TERM GOAL #1   Title Pt will be independent with HEP for improved posture, transfers, balance and gait.  TARGET 02/19/16    Time 4   Period Weeks   Status New     PT SHORT TERM GOAL #2   Title Pt will improve 5x sit<>stand transfers to less than or equal to 15 seconds for improved efficiency and safety of transfers.   Time 4   Period Weeks   Status New     PT SHORT TERM GOAL #3   Title Pt will improve TUG score to less than or equal to 13.5 seconds for decreased fall risk.   Time 4   Period Weeks   Status New     PT SHORT TERM GOAL #4   Title Pt will improve SLS to at least 3 seconds each leg, for improved obstacle and step negotiation.   Time 4   Period Weeks   Status New     PT SHORT TERM GOAL #5   Title Pt will verbalize understanding of local Parkinson's disease resources.   Time 4   Period Weeks   Status New           PT Long Term Goals - 01/30/16 1419      PT LONG TERM GOAL #1   Title Pt will verbalize understanding of fall prevention in home environment.  TARGET 03/30/16   Time 8   Period Weeks   Status New     PT LONG TERM GOAL #2   Title Pt will improve 5x sit<>stand to less than or equal to 11.5 seconds for improved transfer efficiency and safety.   Time 8   Period Weeks   Status New     PT LONG TERM GOAL #3   Title Pt will improve TUG cognitive score to less than or equal to 15 seconds for decreased fall risk.   Time 8   Period Weeks   Status New     PT LONG TERM GOAL #4   Title Pt will improve MiniBESTest to at least 24/28 for decreased fall risk.   Time 8   Period Weeks   Status New     PT LONG TERM GOAL #5   Title Pt will verbalize plans for continued community fitness upon D/C from PT.   Time 8   Period Weeks   Status New               Plan - 02/20/16 1455    Clinical Impression Statement Pt responded well to treadmill and feedback to improve gait today.  Needs cues for intensity with all activities.  Continue PT per POC.   Rehab Potential Good   PT Frequency 2x / week   PT Duration 8 weeks  plus eval   PT Treatment/Interventions ADLs/Self  Care Home Management;Functional mobility training;Gait training;Therapeutic activities;Therapeutic exercise;Balance training;Neuromuscular re-education;Patient/family education   PT Next Visit Plan Gait on  treadmill with gait trainer for further feedback;balance;intensity   Consulted and Agree with Plan of Care Patient;Family member/caregiver   Family Member Consulted daughter      Patient will benefit from skilled therapeutic intervention in order to improve the following deficits and impairments:  Abnormal gait, Decreased balance, Decreased mobility, Decreased strength, Difficulty walking, Impaired flexibility, Postural dysfunction  Visit Diagnosis: Other abnormalities of gait and mobility  Abnormal posture  Unsteadiness on feet     Problem List Patient Active Problem List   Diagnosis Date Noted  . PD (Parkinson's disease) ([place]) 12/18/2015  . Tremor of both hands 11/22/2015  . PAD (peripheral artery disease) s/p remote left external iliac artery angioplasty 10/28/2012  . Coronary atherosclerosis 10/28/2012  . Hyperlipidemia 10/28/2012  . HTN (hypertension) 10/28/2012  . DM2 (diabetes mellitus, type 2) ([place]) 10/28/2012  . Carotid occlusion, right 10/28/2012    Narda Bonds 02/20/2016, 2:58 PM  [facility] [address] [place], [place], [address] Phone: [phone]   Fax:  [phone]  Name: RAMONTE FECTEAU MRN: [id number] Date of Birth: 05-25-1945   Narda Bonds, [place] 02/20/16 2:59 PM Phone: [phone] Fax: [phone]

## 2016-02-23 ENCOUNTER — Other Ambulatory Visit: Payer: Self-pay | Admitting: Neurology

## 2016-02-23 DIAGNOSIS — G20A1 Parkinson's disease without dyskinesia, without mention of fluctuations: Secondary | ICD-10-CM

## 2016-02-23 DIAGNOSIS — G20 Parkinson's disease: Secondary | ICD-10-CM

## 2016-02-27 ENCOUNTER — Ambulatory Visit: Payer: Medicare Other | Admitting: Physical Therapy

## 2016-02-27 ENCOUNTER — Ambulatory Visit: Payer: Medicare Other | Admitting: Occupational Therapy

## 2016-02-27 ENCOUNTER — Ambulatory Visit: Payer: Medicare Other | Admitting: *Deleted

## 2016-02-27 DIAGNOSIS — R29898 Other symptoms and signs involving the musculoskeletal system: Secondary | ICD-10-CM | POA: Diagnosis not present

## 2016-02-27 DIAGNOSIS — R471 Dysarthria and anarthria: Secondary | ICD-10-CM | POA: Diagnosis not present

## 2016-02-27 DIAGNOSIS — M25612 Stiffness of left shoulder, not elsewhere classified: Secondary | ICD-10-CM | POA: Diagnosis not present

## 2016-02-27 DIAGNOSIS — R293 Abnormal posture: Secondary | ICD-10-CM

## 2016-02-27 DIAGNOSIS — R2681 Unsteadiness on feet: Secondary | ICD-10-CM | POA: Diagnosis not present

## 2016-02-27 DIAGNOSIS — R29818 Other symptoms and signs involving the nervous system: Secondary | ICD-10-CM

## 2016-02-27 DIAGNOSIS — R278 Other lack of coordination: Secondary | ICD-10-CM | POA: Diagnosis not present

## 2016-02-27 DIAGNOSIS — R2689 Other abnormalities of gait and mobility: Secondary | ICD-10-CM

## 2016-02-27 DIAGNOSIS — M25512 Pain in left shoulder: Secondary | ICD-10-CM | POA: Diagnosis not present

## 2016-02-27 NOTE — Therapy (Signed)
[place] [address] [place], [place], [address] Phone: [phone]   Fax:  [phone]  Physical Therapy Treatment  Patient Details  Name: Rodney Love MRN: [id number] Date of Birth: May 23, 1945 Referring Provider: Wells Guiles Tat  Encounter Date: 02/27/2016      PT End of Session - 02/27/16 1506    Visit Number 5   Number of Visits 17   Date for PT Re-Evaluation 03/30/16   Authorization Type UHC Medicare-GCODE every 10th visit   PT Start Time 1319   PT Stop Time 1400   PT Time Calculation (min) 41 min   Equipment Utilized During Treatment Gait belt   Activity Tolerance Patient tolerated treatment well   Behavior During Therapy [facility] for tasks assessed/performed      Past Medical History:  Diagnosis Date  . Arthritis   . Constipation    OCCASIONAL  . Coronary artery disease   . Diabetes mellitus without complication ([place])   . Environmental allergies   . Hyperlipidemia   . Hypertension   . Inguinal hernia   . Nocturia   . Peripheral arterial disease [facility])     Past Surgical History:  Procedure Laterality Date  . BACK SURGERY    . CARDIAC CATHETERIZATION  05/29/2006   single vessel CAD w/moderate stenosis of the prox. RCA approx 40%,prox. LAD 20-30%,minor irreg. mid abdominal aorta,left iliac artery disease 50-60%  . CATARACT EXTRACTION Right   . HERNIA REPAIR  [id number]  . INGUINAL HERNIA REPAIR N/A 09/23/2012   Procedure: LAPAROSCOPIC INGUINAL HERNIA with umbilical hernia;  Surgeon: Madilyn Hook, DO;  [facility];  Service: General;  Laterality: N/A;  . INSERTION OF MESH Bilateral 09/23/2012   Procedure: INSERTION OF MESH;  Surgeon: Madilyn Hook, DO;  [facility];  Service: General;  Laterality: Bilateral;    There were no vitals filed for this visit.      Subjective Assessment - 02/27/16 1321    Subjective Denies falls or changes.   Patient is accompained by: Family member  son   Pertinent History Ruptured disc in low back   Patient Stated Goals Pt's goals for therapy is to improve walking and balance.   Currently in Pain? No/denies              [facility] Adult PT Treatment/Exercise - 02/27/16 0001      Transfers   Transfers Sit to Stand;Stand to Sit   Sit to Stand 5: Supervision   Stand to Sit 5: Supervision   Number of Reps 10 reps     Ambulation/Gait   Ambulation/Gait Yes   Ambulation Distance (Feet) 800 Feet  plus   Assistive device None;Other (Comment)  treadmill with gait trainer;walking poles   Gait Pattern Step-through pattern;Decreased arm swing - right;Decreased arm swing - left;Decreased step length - left;Narrow base of support   Ambulation Surface Level;Indoor   Gait Comments gait on treadmill x 6 minutes with gait trainer at 1.9 mph and bil UE support.  Cues for increased heel strike and step length and to decrease heel drag on the L.  Treadmill x 2 minutes kicking blue therapy ball to encourage heel strike and step length.  Gait on ground without device, with walking poles and with PTA holding end of walking poles to encourage arm swing.  Pt able to sequence with poles fairly well and would get back in sequence if not.       Neuro Re-ed    Neuro Re-ed Details  Standing  PWR! moves working on technique and intensity in all basic 4 moves.  Used mirror for visual cues along with demonstration.  Used hurdles for step to encourage increased step.  Taps to 6" step alternating LE's with intermittent UE support then taps to 6", 12", 6" and floor with intermittent UE support.  Pt tending to catch toes at times on edge of step.                  PT Short Term Goals - 01/30/16 1416      PT SHORT TERM GOAL #1   Title Pt will be independent with HEP for improved posture, transfers, balance and gait.  TARGET 02/19/16   Time 4   Period Weeks   Status New     PT SHORT TERM GOAL #2   Title Pt will improve 5x sit<>stand transfers to less than or  equal to 15 seconds for improved efficiency and safety of transfers.   Time 4   Period Weeks   Status New     PT SHORT TERM GOAL #3   Title Pt will improve TUG score to less than or equal to 13.5 seconds for decreased fall risk.   Time 4   Period Weeks   Status New     PT SHORT TERM GOAL #4   Title Pt will improve SLS to at least 3 seconds each leg, for improved obstacle and step negotiation.   Time 4   Period Weeks   Status New     PT SHORT TERM GOAL #5   Title Pt will verbalize understanding of local Parkinson's disease resources.   Time 4   Period Weeks   Status New           PT Long Term Goals - 01/30/16 1419      PT LONG TERM GOAL #1   Title Pt will verbalize understanding of fall prevention in home environment.  TARGET 03/30/16   Time 8   Period Weeks   Status New     PT LONG TERM GOAL #2   Title Pt will improve 5x sit<>stand to less than or equal to 11.5 seconds for improved transfer efficiency and safety.   Time 8   Period Weeks   Status New     PT LONG TERM GOAL #3   Title Pt will improve TUG cognitive score to less than or equal to 15 seconds for decreased fall risk.   Time 8   Period Weeks   Status New     PT LONG TERM GOAL #4   Title Pt will improve MiniBESTest to at least 24/28 for decreased fall risk.   Time 8   Period Weeks   Status New     PT LONG TERM GOAL #5   Title Pt will verbalize plans for continued community fitness upon D/C from PT.   Time 8   Period Weeks   Status New               Plan - 02/27/16 1507    Clinical Impression Statement Pt continues to work well during session and responds to feedback.  Decreased coordination with tasks at times.  Continue PT per POC.   Rehab Potential Good   PT Frequency 2x / week   PT Duration 8 weeks  plus eval   PT Treatment/Interventions ADLs/Self Care Home Management;Functional mobility training;Gait training;Therapeutic activities;Therapeutic exercise;Balance  training;Neuromuscular re-education;Patient/family education   PT Next Visit Plan balance, intensity activities, gait  Consulted and Agree with Plan of Care Patient;Family member/caregiver   Family Member Consulted son      Patient will benefit from skilled therapeutic intervention in order to improve the following deficits and impairments:  Abnormal gait, Decreased balance, Decreased mobility, Decreased strength, Difficulty walking, Impaired flexibility, Postural dysfunction  Visit Diagnosis: Other abnormalities of gait and mobility  Unsteadiness on feet  Abnormal posture     Problem List Patient Active Problem List   Diagnosis Date Noted  . PD (Parkinson's disease) ([employer]) 12/18/2015  . Tremor of both hands 11/22/2015  . PAD (peripheral artery disease) s/p remote left external iliac artery angioplasty 10/28/2012  . Coronary atherosclerosis 10/28/2012  . Hyperlipidemia 10/28/2012  . HTN (hypertension) 10/28/2012  . DM2 (diabetes mellitus, type 2) ([place]) 10/28/2012  . Carotid occlusion, right 10/28/2012    Narda Bonds 02/27/2016, 3:08 PM  [facility] [address] [place], [place], [address] Phone: [phone]   Fax:  [phone]  Name: Rodney Love MRN: [id number] Date of Birth: Feb 06, 1946  Narda Bonds, [place] 02/27/16 3:09 PM Phone: [phone] Fax: [phone]

## 2016-02-27 NOTE — Patient Instructions (Signed)
Complete deep breathing exercises at least 2x/day  Be mindful of two things when doing breathing exercises:  Location of movement - deep vs shallow breathing  Control of breathing in and breathing out  Practice reading something Melony Overly or whatever!)  Be aware of breathing - take a deep breath before speaking  Read aloud each character at a different loudness level  Practice reading or talking to someone in another room at home, or outside. Increase distance as you can.  Do these exercises at home!!

## 2016-02-27 NOTE — Therapy (Signed)
[facility] [address] [place], [place], [address] Phone: [phone]   Fax:  [phone]  Occupational Therapy Treatment  Patient Details  Name: Rodney Love MRN: [id number] Date of Birth: May 29, 1945 Referring Provider: Dr. Carles Collet  Encounter Date: 02/27/2016      OT End of Session - 02/27/16 1952    Visit Number 7   Number of Visits 17   Date for OT Re-Evaluation 03/29/16   Authorization Type UHC Medicare   Authorization - Visit Number 7   Authorization - Number of Visits 10   OT Start Time [id number]   OT Stop Time 1445   OT Time Calculation (min) 40 min   Activity Tolerance Patient tolerated treatment well   Behavior During Therapy [facility] for tasks assessed/performed      Past Medical History:  Diagnosis Date  . Arthritis   . Constipation    OCCASIONAL  . Coronary artery disease   . Diabetes mellitus without complication ([place])   . Environmental allergies   . Hyperlipidemia   . Hypertension   . Inguinal hernia   . Nocturia   . Peripheral arterial disease [facility])     Past Surgical History:  Procedure Laterality Date  . BACK SURGERY    . CARDIAC CATHETERIZATION  05/29/2006   single vessel CAD w/moderate stenosis of the prox. RCA approx 40%,prox. LAD 20-30%,minor irreg. mid abdominal aorta,left iliac artery disease 50-60%  . CATARACT EXTRACTION Right   . HERNIA REPAIR  [id number]  . INGUINAL HERNIA REPAIR N/A 09/23/2012   Procedure: LAPAROSCOPIC INGUINAL HERNIA with umbilical hernia;  Surgeon: Madilyn Hook, DO;  [facility]: [facility];  Service: General;  Laterality: N/A;  . INSERTION OF MESH Bilateral 09/23/2012   Procedure: INSERTION OF MESH;  Surgeon: Madilyn Hook, DO;  [facility]: [facility];  Service: General;  Laterality: Bilateral;    There were no vitals filed for this visit.      Subjective Assessment - 02/27/16 1429    Subjective  Pt reports left shoulder pain   Patient Stated Goals improve use of LUE    Currently in Pain? Yes   Pain Score 3    Pain Location Shoulder   Pain Orientation Left   Pain Descriptors / Indicators Aching   Pain Type Acute pain   Pain Onset More than a month ago   Pain Frequency Intermittent   Aggravating Factors  end range shoulder flexion   Pain Relieving Factors rest, stretching   Multiple Pain Sites No           Treatment:Supine PWR! Up, rock x10 reps each Supine shoulder flexion and chest press x10, min v.c. Arm bike x 6 mins level for conditioning Simulated ADLs with bag with focus/min cues for large amplitude movements:  Donning/doffing pull-over shirt, donning/doffing pants, donning belt/pulling shirt down in back.                          OT Short Term Goals - 01/30/16 1631      OT SHORT TERM GOAL #1   Title I with PD specific HEP. due 02/29/16   Time 4   Period Weeks   Status New     OT SHORT TERM GOAL #2   Title Pt will verbalize understanding of adapted strateiges for ADLs/IADLs.   Time 4   Period Weeks   Status New     OT SHORT TERM GOAL #3   Title Pt will demonstrate ability to  retrieve a lightweight object at 115 shoulder flexion , and -10 elbow extension with LUE with pain less than or equal to 2/10.   Time 4   Period Weeks   Status New     OT SHORT TERM GOAL #4   Title Pt will demonstrate improved fine motor coordination as evidenced by decreasing LUE 9 hole peg test score to 36 secs or less.   Baseline RUE: 28.47 secs, LUE 41.19 secs   Time 8   Period Weeks   Status New           OT Long Term Goals - 01/30/16 1633      OT LONG TERM GOAL #1   Title Pt will verbalize understanding of ways to keep thinking skills sharp, memory compensations PRN..   Time 8   Period Weeks   Status New     OT LONG TERM GOAL #2   Title Pt will verbalize understanding of community resources and ways to prevent future PD- related complications.   Time 8   Period Weeks   Status New     OT LONG TERM GOAL #3    Title Pt will decrease 3 button/ unbutton score to 40 secs or less.   Baseline 48.28   Time 8   Period Weeks   Status New     OT LONG TERM GOAL #4   Title Pt will  demonstrate improved ease with dressing as evidenced by performing PPT# 4(don/doff jacket) in 24 secs or less   Baseline 28.91 secs   Time 8   Period Weeks   Status New               Plan - 02/27/16 1945    Clinical Impression Statement Pt is progressing towards goals with improving ROM and decreased shoulder pain.   Rehab Potential Good   OT Frequency 2x / week   OT Duration 8 weeks   OT Treatment/Interventions Self-care/ADL training;Moist Heat;Fluidtherapy;DME and/or AE instruction;Patient/family education;Balance training;Therapeutic exercises;Ultrasound;Therapeutic exercise;Therapeutic activities;Passive range of motion;Cognitive remediation/compensation;Functional Mobility Training;Neuromuscular education;Parrafin;Energy conservation;Manual Therapy;Visual/perceptual remediation/compensation   Plan check short term goals   OT Home Exercise Plan Education provided:  PWR! sitting (up, rock, step); PWR! supine (basic 4),coordination; PWR! quadraped (up, rock, twist)   Consulted and Agree with Plan of Care Patient;Family member/caregiver      Patient will benefit from skilled therapeutic intervention in order to improve the following deficits and impairments:  Abnormal gait, Decreased coordination, Decreased range of motion, Difficulty walking, Impaired flexibility, Decreased safety awareness, Decreased endurance, Decreased activity tolerance, Decreased knowledge of precautions, Impaired tone, Impaired UE functional use, Pain, Decreased knowledge of use of DME, Decreased balance, Decreased mobility, Decreased strength, Impaired perceived functional ability, Impaired vision/preception  Visit Diagnosis: Other abnormalities of gait and mobility  Abnormal posture  Other symptoms and signs involving the nervous  system  Other symptoms and signs involving the musculoskeletal system  Other lack of coordination    Problem List Patient Active Problem List   Diagnosis Date Noted  . PD (Parkinson's disease) ([place]) 12/18/2015  . Tremor of both hands 11/22/2015  . PAD (peripheral artery disease) s/p remote left external iliac artery angioplasty 10/28/2012  . Coronary atherosclerosis 10/28/2012  . Hyperlipidemia 10/28/2012  . HTN (hypertension) 10/28/2012  . DM2 (diabetes mellitus, type 2) ([place]) 10/28/2012  . Carotid occlusion, right 10/28/2012    Rodney Love 02/27/2016, 7:54 PM  [facility] [address] [place], [place], [address] Phone: [phone]   Fax:  [phone]  Name: Rodney Love MRN: [id number] Date of Birth: 1946-03-30

## 2016-02-27 NOTE — Therapy (Signed)
[facility] [address] [place], [place], [address] Phone: [phone]   Fax:  [phone]  Speech Language Pathology Treatment  Patient Details  Name: Rodney Love MRN: [id number] Date of Birth: [birth year] Referring Provider: Dr. Wells Guiles Tat  Encounter Date: 02/27/2016      End of Session - 02/27/16 1611    Visit Number 4   Number of Visits 17   Date for SLP Re-Evaluation 03/28/16   SLP Start Time 1450   SLP Stop Time  1535   SLP Time Calculation (min) 45 min   Activity Tolerance Patient tolerated treatment well      Past Medical History:  Diagnosis Date  . Arthritis   . Constipation    OCCASIONAL  . Coronary artery disease   . Diabetes mellitus without complication ([place])   . Environmental allergies   . Hyperlipidemia   . Hypertension   . Inguinal hernia   . Nocturia   . Peripheral arterial disease [facility])     Past Surgical History:  Procedure Laterality Date  . BACK SURGERY    . CARDIAC CATHETERIZATION  05/29/2006   single vessel CAD w/moderate stenosis of the prox. RCA approx 40%,prox. LAD 20-30%,minor irreg. mid abdominal aorta,left iliac artery disease 50-60%  . CATARACT EXTRACTION Right   . HERNIA REPAIR  [id number]  . INGUINAL HERNIA REPAIR N/A 09/23/2012   Procedure: LAPAROSCOPIC INGUINAL HERNIA with umbilical hernia;  Surgeon: Madilyn Hook, DO;  [facility];  Service: General;  Laterality: N/A;  . INSERTION OF MESH Bilateral 09/23/2012   Procedure: INSERTION OF MESH;  Surgeon: Madilyn Hook, DO;  [facility];  Service: General;  Laterality: Bilateral;    There were no vitals filed for this visit.      Subjective Assessment - 02/27/16 1605    Subjective I didn't do my breathing exercises   Patient is accompained by: Family member  son   Currently in Pain? Yes   Pain Score 3    Pain Location Shoulder   Pain Orientation Left   Pain Descriptors / Indicators Aching   Pain Type  Acute pain   Pain Onset More than a month ago   Pain Frequency Intermittent   Multiple Pain Sites No               ADULT SLP TREATMENT - 02/27/16 0001      General Information   Behavior/Cognition Alert;Cooperative;Pleasant mood     Treatment Provided   Treatment provided Cognitive-Linquistic     Pain Assessment   Pain Assessment 0-10   Pain Score 3    Pain Location --  shoulder     Cognitive-Linquistic Treatment   Treatment focused on Dysarthria;Voice;Patient/family/caregiver education   Skilled Treatment Pt was seen for skilled ST session targeting goals for improved breath support and loudness. Diaphragmatic breathing exercises were reviewed with pt, who had minimal recollection of them. When asked, pt reported he had not been doing the breathing exercises. SLP provided education on technique of diaphragmatic breathing and breath support, and encouraged him and his son to practice at home at least 2x/day. Pt was given homework to read something aloud with different loudness levels, or to talk to family at varying distances using appropriate loudness for the situation. SLP explained to pt that Parkinson's will progress, and that completion of these exercises is important to continued effective communication and proper breathing/loudness.      Assessment / Recommendations / Plan   Plan Continue with current plan  of care     Progression Toward Goals   Progression toward goals Progressing toward goals          SLP Education - 02/27/16 1611    Education provided Yes   Education Details Deep breathing exercises, loudness activity   Person(s) Educated Patient;Child(ren)   Methods Explanation;Demonstration;Handout   Comprehension Verbalized understanding;Need further instruction;Returned demonstration;Verbal cues required          SLP Short Term Goals - 02/27/16 1615      SLP SHORT TERM GOAL #1   Title Pt will average 90dB on loud /a/ with rare min A over 3 sessions    Time 3   Period Weeks   Status On-going     SLP SHORT TERM GOAL #2   Title Pt will average 70dB on 85% of  sentence level structured speech tasks with occasional min A.   Time 3   Period Weeks   Status On-going     SLP SHORT TERM GOAL #3   Title Pt will maintain average of 70dB during 8 minute simple conversation with occasional min A   Time 3   Period Weeks   Status On-going          SLP Long Term Goals - 02/27/16 1615      SLP LONG TERM GOAL #1   Title Pt will maintain average of 70dB during 15 minute moderately complex conversation with rare min A   Time 7   Period Weeks   Status On-going     SLP LONG TERM GOAL #2   Title Pt. will maintain audible conversation over 12 minutes in noisy environment outside of therapy room.   Time 7   Period Weeks   Status On-going          Plan - 02/27/16 1612    Clinical Impression Statement Pt reported not completing breathing exercises as instructed. Pt appears to have difficulty with attention and recall, as he had difficulty telling SLP about the exercises, techniques and purpose immediately after being told. Instructions were written out and printed for pt/son, and told he needs to complete exercises to benefit from them. Recommend continued ST focusing on functional application of exercises and loudness activities and carryover of therapy tasks.   Speech Therapy Frequency 2x / week   Duration --  8 weeks   Treatment/Interventions Compensatory strategies;Patient/family education;Functional tasks;Environmental controls;SLP instruction and feedback   Potential to [place] breathing/relaxation exercises, loudness activity   Consulted and Agree with Plan of Care Patient;Family member/caregiver   Family Member Consulted son      Patient will benefit from skilled therapeutic intervention in order to improve the following deficits and impairments:   Dysarthria and anarthria    Problem  List Patient Active Problem List   Diagnosis Date Noted  . PD (Parkinson's disease) ([place]) 12/18/2015  . Tremor of both hands 11/22/2015  . PAD (peripheral artery disease) s/p remote left external iliac artery angioplasty 10/28/2012  . Coronary atherosclerosis 10/28/2012  . Hyperlipidemia 10/28/2012  . HTN (hypertension) 10/28/2012  . DM2 (diabetes mellitus, type 2) ([place]) 10/28/2012  . Carotid occlusion, right 10/28/2012   Boen Sterbenz B. [place], MSP, CCC-SLP  Shonna Chock 02/27/2016, 4:16 PM  [facility] [address] [place], [place], [address] Phone: [phone]   Fax:  [phone]   Name: Rodney Love MRN: [id number] Date of Birth: Oct 04, 1945

## 2016-02-27 NOTE — Patient Instructions (Signed)

## 2016-02-29 ENCOUNTER — Ambulatory Visit: Payer: Medicare Other | Admitting: Occupational Therapy

## 2016-02-29 ENCOUNTER — Ambulatory Visit: Payer: Medicare Other | Admitting: Speech Pathology

## 2016-02-29 DIAGNOSIS — R293 Abnormal posture: Secondary | ICD-10-CM

## 2016-02-29 DIAGNOSIS — R29898 Other symptoms and signs involving the musculoskeletal system: Secondary | ICD-10-CM | POA: Diagnosis not present

## 2016-02-29 DIAGNOSIS — R471 Dysarthria and anarthria: Secondary | ICD-10-CM

## 2016-02-29 DIAGNOSIS — R2689 Other abnormalities of gait and mobility: Secondary | ICD-10-CM | POA: Diagnosis not present

## 2016-02-29 DIAGNOSIS — R2681 Unsteadiness on feet: Secondary | ICD-10-CM | POA: Diagnosis not present

## 2016-02-29 DIAGNOSIS — R29818 Other symptoms and signs involving the nervous system: Secondary | ICD-10-CM | POA: Diagnosis not present

## 2016-02-29 DIAGNOSIS — M25612 Stiffness of left shoulder, not elsewhere classified: Secondary | ICD-10-CM | POA: Diagnosis not present

## 2016-02-29 DIAGNOSIS — R278 Other lack of coordination: Secondary | ICD-10-CM

## 2016-02-29 DIAGNOSIS — M25512 Pain in left shoulder: Secondary | ICD-10-CM | POA: Diagnosis not present

## 2016-02-29 NOTE — Therapy (Signed)
[facility] [address] [place], [place], [address] Phone: [phone]   Fax:  [phone]  Occupational Therapy Treatment  Patient Details  Name: Rodney Love MRN: [id number] Date of Birth: 1945-04-28 Referring Provider: Dr. Carles Collet  Encounter Date: 02/29/2016      OT End of Session - 02/29/16 1437    Visit Number 8   Number of Visits 17   Date for OT Re-Evaluation 03/29/16   Authorization Type UHC Medicare   Authorization - Visit Number 8   Authorization - Number of Visits 10   OT Start Time [id number]   OT Stop Time 1445   OT Time Calculation (min) 41 min   Activity Tolerance Patient tolerated treatment well      Past Medical History:  Diagnosis Date  . Arthritis   . Constipation    OCCASIONAL  . Coronary artery disease   . Diabetes mellitus without complication ([place])   . Environmental allergies   . Hyperlipidemia   . Hypertension   . Inguinal hernia   . Nocturia   . Peripheral arterial disease [facility])     Past Surgical History:  Procedure Laterality Date  . BACK SURGERY    . CARDIAC CATHETERIZATION  05/29/2006   single vessel CAD w/moderate stenosis of the prox. RCA approx 40%,prox. LAD 20-30%,minor irreg. mid abdominal aorta,left iliac artery disease 50-60%  . CATARACT EXTRACTION Right   . HERNIA REPAIR  [id number]  . INGUINAL HERNIA REPAIR N/A 09/23/2012   Procedure: LAPAROSCOPIC INGUINAL HERNIA with umbilical hernia;  Surgeon: Madilyn Hook, DO;  [facility];  Service: General;  Laterality: N/A;  . INSERTION OF MESH Bilateral 09/23/2012   Procedure: INSERTION OF MESH;  Surgeon: Madilyn Hook, DO;  [facility];  Service: General;  Laterality: Bilateral;    There were no vitals filed for this visit.              Treatment: Therapist started checking progress towards short term goals. Dynamic functional step and reach with right and left UE's with trunk rotation, elbow extension and  emphasis on finger extension while grasping/ releasing cones. Standing to copy small peg design on a vertical surface for fine motor coordination and functional reach with a cognitive component with LUE, mod v.c. For correct design Arm bike x 6 mins level 1 for conditioning, pt maintaining 40 rpm Donning/ doffing jacket with larger amplitude movements, mod v.c                OT Short Term Goals - 02/29/16 1410      OT SHORT TERM GOAL #1   Title I with PD specific HEP. due 02/29/16   Time 4   Period Weeks   Status Achieved     OT SHORT TERM GOAL #2   Title Pt will verbalize understanding of adapted strateiges for ADLs/IADLs.   Time 4   Period Weeks   Status On-going     OT SHORT TERM GOAL #3   Title Pt will demonstrate ability to retrieve a lightweight object at 115 shoulder flexion , and -10 elbow extension with LUE with pain less than or equal to 2/10.   Time 4   Period Weeks   Status On-going     OT SHORT TERM GOAL #4   Title Pt will demonstrate improved fine motor coordination as evidenced by decreasing LUE 9 hole peg test score to 36 secs or less.   Baseline RUE: 28.47 secs, LUE 41.19 secs  Time 8   Period Weeks   Status Achieved  37.09, 28.50- approximating average meets goal           OT Long Term Goals - 01/30/16 1633      OT LONG TERM GOAL #1   Title Pt will verbalize understanding of ways to keep thinking skills sharp, memory compensations PRN..   Time 8   Period Weeks   Status New     OT LONG TERM GOAL #2   Title Pt will verbalize understanding of community resources and ways to prevent future PD- related complications.   Time 8   Period Weeks   Status New     OT LONG TERM GOAL #3   Title Pt will decrease 3 button/ unbutton score to 40 secs or less.   Baseline 48.28   Time 8   Period Weeks   Status New     OT LONG TERM GOAL #4   Title Pt will  demonstrate improved ease with dressing as evidenced by performing PPT# 4(don/doff jacket)  in 24 secs or less   Baseline 28.91 secs   Time 8   Period Weeks   Status New               Plan - 02/29/16 1433    Clinical Impression Statement Pt is progressing towards goals with improving ROM and LUE functional use. Pt can benefit from continued repetion/ reinforcement of adpted strategies and larger amplitude movements.   Rehab Potential Good   OT Frequency 2x / week   OT Duration 8 weeks   OT Treatment/Interventions Self-care/ADL training;Moist Heat;Fluidtherapy;DME and/or AE instruction;Patient/family education;Balance training;Therapeutic exercises;Ultrasound;Therapeutic exercise;Therapeutic activities;Passive range of motion;Cognitive remediation/compensation;Functional Mobility Training;Neuromuscular education;Parrafin;Energy conservation;Manual Therapy;Visual/perceptual remediation/compensation   Plan reinforce adapted strategies for ADLs.   OT Home Exercise Plan Education provided:  PWR! sitting (up, rock, step); PWR! supine (basic 4),coordination; PWR! quadraped (up, rock, twist)   Consulted and Agree with Plan of Care Patient;Family member/caregiver      Patient will benefit from skilled therapeutic intervention in order to improve the following deficits and impairments:  Abnormal gait, Decreased coordination, Decreased range of motion, Difficulty walking, Impaired flexibility, Decreased safety awareness, Decreased endurance, Decreased activity tolerance, Decreased knowledge of precautions, Impaired tone, Impaired UE functional use, Pain, Decreased knowledge of use of DME, Decreased balance, Decreased mobility, Decreased strength, Impaired perceived functional ability, Impaired vision/preception  Visit Diagnosis: Abnormal posture  Other symptoms and signs involving the nervous system  Other symptoms and signs involving the musculoskeletal system  Other lack of coordination    Problem List Patient Active Problem List   Diagnosis Date Noted  . PD (Parkinson's  disease) ([place]) 12/18/2015  . Tremor of both hands 11/22/2015  . PAD (peripheral artery disease) s/p remote left external iliac artery angioplasty 10/28/2012  . Coronary atherosclerosis 10/28/2012  . Hyperlipidemia 10/28/2012  . HTN (hypertension) 10/28/2012  . DM2 (diabetes mellitus, type 2) ([place]) 10/28/2012  . Carotid occlusion, right 10/28/2012    Greer Koeppen 02/29/2016, 2:38 PM  [facility] [address] [place], [place], [address] Phone: [phone]   Fax:  [phone]  Name: ANTIONNE ROANE MRN: [id number] Date of Birth: 04/23/1945

## 2016-02-29 NOTE — Therapy (Signed)
[facility] [address] [place], [place], [address] Phone: [phone]   Fax:  [phone]  Speech Language Pathology Treatment  Patient Details  Name: Rodney Love MRN: [id number] Date of Birth: 10-22-1945 Referring Provider: Dr. Wells Guiles Tat  Encounter Date: 02/29/2016      End of Session - 02/29/16 1457    Visit Number 5   Number of Visits 17   Date for SLP Re-Evaluation 03/28/16   SLP Start Time [id number]   SLP Stop Time  1400   SLP Time Calculation (min) 43 min   Activity Tolerance Patient tolerated treatment well      Past Medical History:  Diagnosis Date  . Arthritis   . Constipation    OCCASIONAL  . Coronary artery disease   . Diabetes mellitus without complication ([place])   . Environmental allergies   . Hyperlipidemia   . Hypertension   . Inguinal hernia   . Nocturia   . Peripheral arterial disease [facility])     Past Surgical History:  Procedure Laterality Date  . BACK SURGERY    . CARDIAC CATHETERIZATION  05/29/2006   single vessel CAD w/moderate stenosis of the prox. RCA approx 40%,prox. LAD 20-30%,minor irreg. mid abdominal aorta,left iliac artery disease 50-60%  . CATARACT EXTRACTION Right   . HERNIA REPAIR  [id number]  . INGUINAL HERNIA REPAIR N/A 09/23/2012   Procedure: LAPAROSCOPIC INGUINAL HERNIA with umbilical hernia;  Surgeon: Madilyn Hook, DO;  [facility];  Service: General;  Laterality: N/A;  . INSERTION OF MESH Bilateral 09/23/2012   Procedure: INSERTION OF MESH;  Surgeon: Madilyn Hook, DO;  [facility];  Service: General;  Laterality: Bilateral;    There were no vitals filed for this visit.      Subjective Assessment - 02/29/16 1323    Subjective "I've been talking slow and loud"   Patient is accompained by: Family member   Special Tests daughter, [name]               ADULT SLP TREATMENT - 02/29/16 1323      General Information   Behavior/Cognition  Alert;Cooperative;Pleasant mood     Treatment Provided   Treatment provided Cognitive-Linquistic     Pain Assessment   Pain Assessment No/denies pain     Cognitive-Linquistic Treatment   Treatment focused on Dysarthria;Voice;Patient/family/caregiver education   Skilled Treatment Loud /a/ average 90dB to recalibrate loudness, with rare min A for abdmoninal breath support . Structured speech tasks that included some oral reading and generating spontaneous sentences with average of 68dB. Structured descriptive task with average of 70dB with occasional min A.  Simple conversation with average of 70dB - with occasional min A.     Assessment / Recommendations / Plan   Plan Continue with current plan of care            SLP Short Term Goals - 02/29/16 1457      SLP SHORT TERM GOAL #1   Title Pt will average 90dB on loud /a/ with rare min A over 3 sessions   Baseline 02/29/16;   Time 3   Period Weeks   Status On-going     SLP SHORT TERM GOAL #2   Title Pt will average 70dB on 85% of  sentence level structured speech tasks with occasional min A.   Time 3   Period Weeks   Status On-going     SLP SHORT TERM GOAL #3   Title Pt will maintain average of 70dB  during 8 minute simple conversation with occasional min A   Time 3   Period Weeks   Status On-going          SLP Long Term Goals - 02/29/16 1457      SLP LONG TERM GOAL #1   Title Pt will maintain average of 70dB during 15 minute moderately complex conversation with rare min A   Time 7   Period Weeks   Status On-going     SLP LONG TERM GOAL #2   Title Pt. will maintain audible conversation over 12 minutes in noisy environment outside of therapy room.   Time 7   Period Weeks   Status On-going          Plan - 02/29/16 1456    Treatment/Interventions Compensatory strategies;Patient/family education;Functional tasks;Environmental controls;SLP instruction and feedback   Potential to Achieve Goals Good   Consulted  and Agree with Plan of Care Patient;Family member/caregiver      Patient will benefit from skilled therapeutic intervention in order to improve the following deficits and impairments:   Dysarthria and anarthria    Problem List Patient Active Problem List   Diagnosis Date Noted  . PD (Parkinson's disease) ([place]) 12/18/2015  . Tremor of both hands 11/22/2015  . PAD (peripheral artery disease) s/p remote left external iliac artery angioplasty 10/28/2012  . Coronary atherosclerosis 10/28/2012  . Hyperlipidemia 10/28/2012  . HTN (hypertension) 10/28/2012  . DM2 (diabetes mellitus, type 2) ([place]) 10/28/2012  . Carotid occlusion, right 10/28/2012    Rodney Love, Rodney Love, CCC-SLP 02/29/2016, 2:58 PM  [facility] [address] [place], [place], [address] Phone: [phone]   Fax:  [phone]   Name: Rodney Love MRN: [id number] Date of Birth: [birth year]

## 2016-03-05 ENCOUNTER — Ambulatory Visit: Payer: Medicare Other | Attending: Neurology | Admitting: Physical Therapy

## 2016-03-05 ENCOUNTER — Ambulatory Visit: Payer: Medicare Other | Admitting: Occupational Therapy

## 2016-03-05 ENCOUNTER — Ambulatory Visit: Payer: Medicare Other | Admitting: *Deleted

## 2016-03-05 DIAGNOSIS — R29818 Other symptoms and signs involving the nervous system: Secondary | ICD-10-CM | POA: Insufficient documentation

## 2016-03-05 DIAGNOSIS — R2689 Other abnormalities of gait and mobility: Secondary | ICD-10-CM | POA: Diagnosis not present

## 2016-03-05 DIAGNOSIS — R29898 Other symptoms and signs involving the musculoskeletal system: Secondary | ICD-10-CM | POA: Diagnosis not present

## 2016-03-05 DIAGNOSIS — R2681 Unsteadiness on feet: Secondary | ICD-10-CM | POA: Diagnosis not present

## 2016-03-05 DIAGNOSIS — R278 Other lack of coordination: Secondary | ICD-10-CM

## 2016-03-05 DIAGNOSIS — R293 Abnormal posture: Secondary | ICD-10-CM | POA: Insufficient documentation

## 2016-03-05 DIAGNOSIS — M25612 Stiffness of left shoulder, not elsewhere classified: Secondary | ICD-10-CM | POA: Diagnosis not present

## 2016-03-05 DIAGNOSIS — R471 Dysarthria and anarthria: Secondary | ICD-10-CM | POA: Insufficient documentation

## 2016-03-05 NOTE — Therapy (Signed)
[facility] [address] [place], [place], [address] Phone: [phone]   Fax:  [phone]  Occupational Therapy Treatment  Patient Details  Name: Rodney Love MRN: [id number] Date of Birth: [birth year] Referring Provider: Dr. Carles Collet  Encounter Date: 03/05/2016      OT End of Session - 03/05/16 1510    Visit Number 9   Number of Visits 17   Date for OT Re-Evaluation 03/29/16   Authorization Type UHC Medicare   Authorization - Visit Number 9   Authorization - Number of Visits 10   OT Start Time 1450   OT Stop Time 1530   OT Time Calculation (min) 40 min   Activity Tolerance Patient tolerated treatment well   Behavior During Therapy [facility] for tasks assessed/performed      Past Medical History:  Diagnosis Date  . Arthritis   . Constipation    OCCASIONAL  . Coronary artery disease   . Diabetes mellitus without complication ([place])   . Environmental allergies   . Hyperlipidemia   . Hypertension   . Inguinal hernia   . Nocturia   . Peripheral arterial disease [facility])     Past Surgical History:  Procedure Laterality Date  . BACK SURGERY    . CARDIAC CATHETERIZATION  05/29/2006   single vessel CAD w/moderate stenosis of the prox. RCA approx 40%,prox. LAD 20-30%,minor irreg. mid abdominal aorta,left iliac artery disease 50-60%  . CATARACT EXTRACTION Right   . HERNIA REPAIR  [id number]  . INGUINAL HERNIA REPAIR N/A 09/23/2012   Procedure: LAPAROSCOPIC INGUINAL HERNIA with umbilical hernia;  Surgeon: Madilyn Hook, DO;  Location: [facility];  Service: General;  Laterality: N/A;  . INSERTION OF MESH Bilateral 09/23/2012   Procedure: INSERTION OF MESH;  Surgeon: Madilyn Hook, DO;  Location: [facility];  Service: General;  Laterality: Bilateral;    There were no vitals filed for this visit.      Subjective Assessment - 03/05/16 1544    Subjective  Pt denies shoulder pain   Pertinent History see Epic, hx of back surgery   Patient Stated Goals improve use of LUE   Currently in Pain? No/denies          Reviewed donning/ doffing jacket with big movements and adapted strategies x 3, followed by education regarding cutting food with big movements, and opening containers, pt returned demonstration . Dynamic multi directional step and reach with trunk rotation to place large pegs in vertical pegboard with bilateral UE's, mod v.c. For large amplitude movements and finger ext. Dual tasking while ambulating, tossing a ball and performing category generation mod v.c. Arm bike x 6 mins level 1                      OT Short Term Goals - 03/05/16 1521      OT SHORT TERM GOAL #1   Title (P)  I with PD specific HEP. due 02/29/16   Time (P)  4   Period (P)  Weeks   Status (P)  Achieved     OT SHORT TERM GOAL #2   Title (P)  Pt will verbalize understanding of adapted strateiges for ADLs/IADLs.   Time (P)  4   Period (P)  Weeks   Status (P)  Achieved     OT SHORT TERM GOAL #3   Title (P)  Pt will demonstrate ability to retrieve a lightweight object at 115 shoulder flexion , and -10 elbow extension with LUE  with pain less than or equal to 2/10.   Time (P)  4   Period (P)  Weeks   Status (P)  Achieved     OT SHORT TERM GOAL #4   Title (P)  Pt will demonstrate improved fine motor coordination as evidenced by decreasing LUE 9 hole peg test score to 36 secs or less.   Baseline (P)  RUE: 28.47 secs, LUE 41.19 secs   Time (P)  8   Period (P)  Weeks   Status (P)  Achieved  37.09, 28.50- approximating average meets goal           OT Long Term Goals - 01/30/16 1633      OT LONG TERM GOAL #1   Title Pt will verbalize understanding of ways to keep thinking skills sharp, memory compensations PRN..   Time 8   Period Weeks   Status New     OT LONG TERM GOAL #2   Title Pt will verbalize understanding of community resources and ways to prevent future PD- related complications.   Time 8   Period  Weeks   Status New     OT LONG TERM GOAL #3   Title Pt will decrease 3 button/ unbutton score to 40 secs or less.   Baseline 48.28   Time 8   Period Weeks   Status New     OT LONG TERM GOAL #4   Title Pt will  demonstrate improved ease with dressing as evidenced by performing PPT# 4(don/doff jacket) in 24 secs or less   Baseline 28.91 secs   Time 8   Period Weeks   Status New               Plan - 03/05/16 1510    Clinical Impression Statement Pt is progressing towards goals. He demonstrates understanding of adapted strategies for ADLs.   Rehab Potential Good   OT Frequency 2x / week   OT Duration 8 weeks   OT Treatment/Interventions Self-care/ADL training;Moist Heat;Fluidtherapy;DME and/or AE instruction;Patient/family education;Balance training;Therapeutic exercises;Ultrasound;Therapeutic exercise;Therapeutic activities;Passive range of motion;Cognitive remediation/compensation;Functional Mobility Training;Neuromuscular education;[place];Energy conservation;Manual Therapy;Visual/perceptual remediation/compensation   Plan progress towards long term goals   OT Home Exercise Plan Education provided:  PWR! sitting (up, rock, step); PWR! supine (basic 4),coordination; PWR! quadraped (up, rock, twist)   Recommended Other Services son    Consulted and Agree with Plan of Care Patient;Family member/caregiver      Patient will benefit from skilled therapeutic intervention in order to improve the following deficits and impairments:  Abnormal gait, Decreased coordination, Decreased range of motion, Difficulty walking, Impaired flexibility, Decreased safety awareness, Decreased endurance, Decreased activity tolerance, Decreased knowledge of precautions, Impaired tone, Impaired UE functional use, Pain, Decreased knowledge of use of DME, Decreased balance, Decreased mobility, Decreased strength, Impaired perceived functional ability, Impaired vision/preception  Visit Diagnosis: Other  symptoms and signs involving the nervous system  Other symptoms and signs involving the musculoskeletal system  Other lack of coordination    Problem List Patient Active Problem List   Diagnosis Date Noted  . PD (Parkinson's disease) ([place]) 12/18/2015  . Tremor of both hands 11/22/2015  . PAD (peripheral artery disease) s/p remote left external iliac artery angioplasty 10/28/2012  . Coronary atherosclerosis 10/28/2012  . Hyperlipidemia 10/28/2012  . HTN (hypertension) 10/28/2012  . DM2 (diabetes mellitus, type 2) ([place]) 10/28/2012  . Carotid occlusion, right 10/28/2012    Trust Crago 03/05/2016, 4:01 PM  [facility] [address] [place],  [place], [address] Phone: [phone]   Fax:  [phone]  Name: RECE CHOWNING MRN: [id number] Date of Birth: [birth year]

## 2016-03-05 NOTE — Therapy (Signed)
[facility] [address] [place], [place], [address] Phone: [phone]   Fax:  [phone]  Physical Therapy Treatment  Patient Details  Name: Rodney Love MRN: [id number] Date of Birth: [birth year] Referring Provider: Wells Guiles Tat  Encounter Date: 03/05/2016      PT End of Session - 03/05/16 1500    Visit Number 6   Number of Visits 17   Date for PT Re-Evaluation 03/30/16   Authorization Type UHC Medicare-GCODE every 10th visit   PT Start Time 1405   PT Stop Time 1446   PT Time Calculation (min) 41 min   Activity Tolerance Patient tolerated treatment well   Behavior During Therapy [facility] for tasks assessed/performed      Past Medical History:  Diagnosis Date  . Arthritis   . Constipation    OCCASIONAL  . Coronary artery disease   . Diabetes mellitus without complication ([place])   . Environmental allergies   . Hyperlipidemia   . Hypertension   . Inguinal hernia   . Nocturia   . Peripheral arterial disease [facility])     Past Surgical History:  Procedure Laterality Date  . BACK SURGERY    . CARDIAC CATHETERIZATION  05/29/2006   single vessel CAD w/moderate stenosis of the prox. RCA approx 40%,prox. LAD 20-30%,minor irreg. mid abdominal aorta,left iliac artery disease 50-60%  . CATARACT EXTRACTION Right   . HERNIA REPAIR  [id number]  . INGUINAL HERNIA REPAIR N/A 09/23/2012   Procedure: LAPAROSCOPIC INGUINAL HERNIA with umbilical hernia;  Surgeon: Madilyn Hook, DO;  [facility];  Service: General;  Laterality: N/A;  . INSERTION OF MESH Bilateral 09/23/2012   Procedure: INSERTION OF MESH;  Surgeon: Madilyn Hook, DO;  [facility]: [facility];  Service: General;  Laterality: Bilateral;    There were no vitals filed for this visit.      Subjective Assessment - 03/05/16 1406    Subjective "It seems like they have helped my shoulder and back." [id number].  Denies falls or changes.   Patient is accompained by: Family  member  son   Pertinent History Ruptured disc in low back   Patient Stated Goals Pt's goals for therapy is to improve walking and balance.   Currently in Pain? No/denies              [facility] Adult PT Treatment/Exercise - 03/05/16 0001      Transfers   Transfers Floor to Transfer   Floor to Transfer 5: Supervision;With upper extremity assist;Without upper extremity assist;Multiple attempts   Floor to Transfer Details (indicate cue type and reason) min cues for technique   Floor to Transfer Details Verbal cues for technique;Tactile cues for weight beaing  repeated multiple times prep for PWR! supine on floor as HEP           PWR [facility]) - 03/05/16 1451    PWR! exercises Moves in standing;Moves in [place];Moves in supine   PWR! Up 10   PWR! Step 10   Comments quadruped in floor on mat   PWR! Up 20   PWR! Rock 20   PWR! Twist 20   PWR! Step 20   Comments in supine on mat-moderate cues for amplitude and sequence and intensity;reports difficult performing in bed so discussed getting a mat for home to use in floor   PWR! Up 20   PWR! Rock 20   PWR! Twist 20   PWR Step 20   Comments In standing using boom wackers-moderate to maximum cues for  technique and intensity             PT Education - 03/05/16 1458    Education provided Yes   Education Details Using mat in floor to perform PWR! supine if too difficult in bed but still encourage to attempt to perform in bed as well;stand<>floor transfers   Person(s) Educated Patient;Child(ren)   Methods Explanation;Demonstration;Tactile cues;Verbal cues   Comprehension Verbalized understanding;Returned demonstration          PT Short Term Goals - 01/30/16 1416      PT SHORT TERM GOAL #1   Title Pt will be independent with HEP for improved posture, transfers, balance and gait.  TARGET 02/19/16   Time 4   Period Weeks   Status New     PT SHORT TERM GOAL #2   Title Pt will improve 5x sit<>stand transfers to less than or  equal to 15 seconds for improved efficiency and safety of transfers.   Time 4   Period Weeks   Status New     PT SHORT TERM GOAL #3   Title Pt will improve TUG score to less than or equal to 13.5 seconds for decreased fall risk.   Time 4   Period Weeks   Status New     PT SHORT TERM GOAL #4   Title Pt will improve SLS to at least 3 seconds each leg, for improved obstacle and step negotiation.   Time 4   Period Weeks   Status New     PT SHORT TERM GOAL #5   Title Pt will verbalize understanding of local Parkinson's disease resources.   Time 4   Period Weeks   Status New           PT Long Term Goals - 01/30/16 1419      PT LONG TERM GOAL #1   Title Pt will verbalize understanding of fall prevention in home environment.  TARGET 03/30/16   Time 8   Period Weeks   Status New     PT LONG TERM GOAL #2   Title Pt will improve 5x sit<>stand to less than or equal to 11.5 seconds for improved transfer efficiency and safety.   Time 8   Period Weeks   Status New     PT LONG TERM GOAL #3   Title Pt will improve TUG cognitive score to less than or equal to 15 seconds for decreased fall risk.   Time 8   Period Weeks   Status New     PT LONG TERM GOAL #4   Title Pt will improve MiniBESTest to at least 24/28 for decreased fall risk.   Time 8   Period Weeks   Status New     PT LONG TERM GOAL #5   Title Pt will verbalize plans for continued community fitness upon D/C from PT.   Time 8   Period Weeks   Status New               Plan - 03/05/16 1502    Clinical Impression Statement Pt continues to need cues for technique and intensity.  Has difficulty coordinating movements but does well with increased cues and reps.  Continue PT per POC.   Rehab Potential Good   PT Frequency 2x / week   PT Duration 8 weeks  plus eval   PT Treatment/Interventions ADLs/Self Care Home Management;Functional mobility training;Gait training;Therapeutic activities;Therapeutic  exercise;Balance training;Neuromuscular re-education;Patient/family education   PT Next Visit Plan PT to check  vs defer goals secondary to pt unable to get scheduled initially yet nearing end of recert;continue gait and exercise with intensity.   Consulted and Agree with Plan of Care Patient;Family member/caregiver   Family Member Consulted son      Patient will benefit from skilled therapeutic intervention in order to improve the following deficits and impairments:  Abnormal gait, Decreased balance, Decreased mobility, Decreased strength, Difficulty walking, Impaired flexibility, Postural dysfunction  Visit Diagnosis: Other abnormalities of gait and mobility  Unsteadiness on feet  Abnormal posture     Problem List Patient Active Problem List   Diagnosis Date Noted  . PD (Parkinson's disease) ([place]) 12/18/2015  . Tremor of both hands 11/22/2015  . PAD (peripheral artery disease) s/p remote left external iliac artery angioplasty 10/28/2012  . Coronary atherosclerosis 10/28/2012  . Hyperlipidemia 10/28/2012  . HTN (hypertension) 10/28/2012  . DM2 (diabetes mellitus, type 2) ([place]) 10/28/2012  . Carotid occlusion, right 10/28/2012    Narda Bonds 03/05/2016, 3:04 PM  [facility] [address] [place], [place], [address] Phone: [phone]   Fax:  [phone]  Name: Rodney Love MRN: [id number] Date of Birth: 1946/03/14  Narda Bonds, [place] 03/05/16 3:05 PM Phone: [phone] Fax: [phone]

## 2016-03-05 NOTE — Therapy (Signed)
[facility] [address] [place], [place], [address] Phone: [phone]   Fax:  [phone]  Speech Language Pathology Treatment  Patient Details  Name: Rodney Love MRN: [id number] Date of Birth: 1945-06-03 Referring Provider: Dr. Wells Guiles Tat  Encounter Date: 03/05/2016      End of Session - 03/05/16 1547    Visit Number 6   Number of Visits 17   Date for SLP Re-Evaluation 03/28/16   SLP Start Time [id number]   SLP Stop Time  1400   SLP Time Calculation (min) 45 min   Activity Tolerance Patient tolerated treatment well      Past Medical History:  Diagnosis Date  . Arthritis   . Constipation    OCCASIONAL  . Coronary artery disease   . Diabetes mellitus without complication ([place])   . Environmental allergies   . Hyperlipidemia   . Hypertension   . Inguinal hernia   . Nocturia   . Peripheral arterial disease [facility])     Past Surgical History:  Procedure Laterality Date  . BACK SURGERY    . CARDIAC CATHETERIZATION  05/29/2006   single vessel CAD w/moderate stenosis of the prox. RCA approx 40%,prox. LAD 20-30%,minor irreg. mid abdominal aorta,left iliac artery disease 50-60%  . CATARACT EXTRACTION Right   . HERNIA REPAIR  [id number]  . INGUINAL HERNIA REPAIR N/A 09/23/2012   Procedure: LAPAROSCOPIC INGUINAL HERNIA with umbilical hernia;  Surgeon: Madilyn Hook, DO;  [facility];  Service: General;  Laterality: N/A;  . INSERTION OF MESH Bilateral 09/23/2012   Procedure: INSERTION OF MESH;  Surgeon: Madilyn Hook, DO;  [facility];  Service: General;  Laterality: Bilateral;    There were no vitals filed for this visit.      Subjective Assessment - 03/05/16 1537    Subjective I did some of my exercises   Patient is accompained by: Family member   Special Tests son   Currently in Pain? No/denies               ADULT SLP TREATMENT - 03/05/16 0001      General Information   Behavior/Cognition  Alert;Cooperative;Pleasant mood     Treatment Provided   Treatment provided Cognitive-Linquistic     Pain Assessment   Pain Assessment No/denies pain     Cognitive-Linquistic Treatment   Treatment focused on Dysarthria;Voice;Patient/family/caregiver education   Skilled Treatment Pt was seen for skilled ST treatment targeting loudness goals. Pt indicated he is not completing breathing exercises on a daily basis, but does try to do PT/OT exercises. Pt was encouraged to incorporate ST exercises into PT/OT exercises, counting aloud with deep diaphragmatic breathing, good breath support, and variable loudness. Encouraged pt and son to involve wife and daughter in having pt complete exercises for maximum benefit., as progress is not made in the therapy room, but at home with carryover and functional application of strategies learned in therapy. Diagnostic treatment was also addressed, with administration of the [facility] Cognitive Assessment (MoCA), due to difficulty pt has had with recall of instructions during therapy. Pt scored 23/30 (n=26+/30), indicating mild cognitive impairment for this assessment and pt level of education (9th grade).      Assessment / Recommendations / Plan   Plan Continue with current plan of care     Progression Toward Goals   Progression toward goals Progressing toward goals          SLP Education - 03/05/16 1546    Education provided Yes  Education Details variable loudness exercises with vowel sounds and counting. Reiterated importance of follow through at home   Person(s) Educated Patient;Child(ren)   Methods Explanation;Demonstration;Handout   Comprehension Verbalized understanding;Need further instruction;Returned demonstration          SLP Short Term Goals - 03/05/16 1549      SLP SHORT TERM GOAL #1   Title Pt will average 90dB on loud /a/ with rare min A over 3 sessions   Time 2   Period Weeks   Status On-going     SLP SHORT TERM GOAL #2   Title  Pt will average 70dB on 85% of  sentence level structured speech tasks with occasional min A.   Time 2   Period Weeks   Status On-going     SLP SHORT TERM GOAL #3   Title Pt will maintain average of 70dB during 8 minute simple conversation with occasional min A   Time 2   Period Weeks   Status On-going          SLP Long Term Goals - 03/05/16 1549      SLP LONG TERM GOAL #1   Title Pt will maintain average of 70dB during 15 minute moderately complex conversation with rare min A   Time 6   Period Weeks   Status On-going     SLP LONG TERM GOAL #2   Title Pt. will maintain audible conversation over 12 minutes in noisy environment outside of therapy room.   Time 6   Period Weeks   Status On-going          Plan - 03/05/16 1547    Clinical Impression Statement Pt continues to required encouragement to complete home exercise program. Continued skilled ST is recommended to maximize pt effective communication.   Speech Therapy Frequency 2x / week   Duration --  8 weeks   Treatment/Interventions Compensatory strategies;Patient/family education;Functional tasks;Environmental controls;SLP instruction and feedback   Potential to [place] breathing/relaxation exercises, loudness activity   Consulted and Agree with Plan of Care Patient;Family member/caregiver   Family Member Consulted son      Patient will benefit from skilled therapeutic intervention in order to improve the following deficits and impairments:   Dysarthria and anarthria    Problem List Patient Active Problem List   Diagnosis Date Noted  . PD (Parkinson's disease) ([place]) 12/18/2015  . Tremor of both hands 11/22/2015  . PAD (peripheral artery disease) s/p remote left external iliac artery angioplasty 10/28/2012  . Coronary atherosclerosis 10/28/2012  . Hyperlipidemia 10/28/2012  . HTN (hypertension) 10/28/2012  . DM2 (diabetes mellitus, type 2) ([place]) 10/28/2012  . Carotid  occlusion, right 10/28/2012   Charrise Lardner B. [place], MSP, CCC-SLP  Shonna Chock 03/05/2016, 3:50 PM  [facility] [address] [place], [place], [address] Phone: [phone]   Fax:  [phone]   Name: Rodney Love MRN: [id number] Date of Birth: 1945/04/26

## 2016-03-07 ENCOUNTER — Ambulatory Visit: Payer: Medicare Other | Admitting: *Deleted

## 2016-03-07 ENCOUNTER — Encounter: Payer: Medicare Other | Admitting: Occupational Therapy

## 2016-03-13 ENCOUNTER — Ambulatory Visit: Payer: Medicare Other

## 2016-03-13 ENCOUNTER — Ambulatory Visit: Payer: Medicare Other | Admitting: Occupational Therapy

## 2016-03-13 ENCOUNTER — Ambulatory Visit: Payer: Medicare Other | Admitting: Physical Therapy

## 2016-03-13 DIAGNOSIS — R293 Abnormal posture: Secondary | ICD-10-CM | POA: Diagnosis not present

## 2016-03-13 DIAGNOSIS — M25612 Stiffness of left shoulder, not elsewhere classified: Secondary | ICD-10-CM | POA: Diagnosis not present

## 2016-03-13 DIAGNOSIS — R471 Dysarthria and anarthria: Secondary | ICD-10-CM

## 2016-03-13 DIAGNOSIS — R2689 Other abnormalities of gait and mobility: Secondary | ICD-10-CM

## 2016-03-13 DIAGNOSIS — R29898 Other symptoms and signs involving the musculoskeletal system: Secondary | ICD-10-CM | POA: Diagnosis not present

## 2016-03-13 DIAGNOSIS — R2681 Unsteadiness on feet: Secondary | ICD-10-CM

## 2016-03-13 DIAGNOSIS — R278 Other lack of coordination: Secondary | ICD-10-CM | POA: Diagnosis not present

## 2016-03-13 DIAGNOSIS — R29818 Other symptoms and signs involving the nervous system: Secondary | ICD-10-CM | POA: Diagnosis not present

## 2016-03-13 NOTE — Therapy (Signed)
[facility] [address] [place], [place], [address] Phone: [phone]   Fax:  [phone]  Occupational Therapy Treatment  Patient Details  Name: Rodney Love MRN: [id number] Date of Birth: 1946-01-28 Referring Provider: Dr. Carles Collet  Encounter Date: 03/13/2016      OT End of Session - 03/13/16 1346    Visit Number 10   Number of Visits 17   Date for OT Re-Evaluation 03/29/16   Authorization Type UHC Medicare   Authorization - Number of Visits 10      Past Medical History:  Diagnosis Date  . Arthritis   . Constipation    OCCASIONAL  . Coronary artery disease   . Diabetes mellitus without complication ([place])   . Environmental allergies   . Hyperlipidemia   . Hypertension   . Inguinal hernia   . Nocturia   . Peripheral arterial disease [facility])     Past Surgical History:  Procedure Laterality Date  . BACK SURGERY    . CARDIAC CATHETERIZATION  05/29/2006   single vessel CAD w/moderate stenosis of the prox. RCA approx 40%,prox. LAD 20-30%,minor irreg. mid abdominal aorta,left iliac artery disease 50-60%  . CATARACT EXTRACTION Right   . HERNIA REPAIR  [id number]  . INGUINAL HERNIA REPAIR N/A 09/23/2012   Procedure: LAPAROSCOPIC INGUINAL HERNIA with umbilical hernia;  Surgeon: Madilyn Hook, DO;  Location: [facility];  Service: General;  Laterality: N/A;  . INSERTION OF MESH Bilateral 09/23/2012   Procedure: INSERTION OF MESH;  Surgeon: Madilyn Hook, DO;  Location: [facility];  Service: General;  Laterality: Bilateral;    There were no vitals filed for this visit.      Subjective Assessment - 03/13/16 1430    Subjective  Pt denies shoulder pain   Pertinent History see Epic, hx of back surgery   Patient Stated Goals improve use of LUE   Currently in Pain? No/denies             Grooved pegs for increased fine motor coordination, min difficulty placing, min-mod removing with in hand manipulation Arm bike x 6  mins level 1 for conditioning, pt maintained 40 rpm            PWR [facility]) - 03/13/16 1432    PWR! Up 10   PWR! Rock 10   PWR! Step 10   Comments modified over table, mod v.c. and demonstration             OT Education - 03/13/16 1433    Education provided Yes   Education Details memory compensations, PWR! modified quadraped   Person(s) Educated Patient;Child(ren)   Methods Explanation;Demonstration   Comprehension Verbalized understanding;Returned demonstration;Verbal cues required          OT Short Term Goals - 03/05/16 1521      OT SHORT TERM GOAL #1   Title (P)  I with PD specific HEP. due 02/29/16   Time (P)  4   Period (P)  Weeks   Status (P)  Achieved     OT SHORT TERM GOAL #2   Title (P)  Pt will verbalize understanding of adapted strateiges for ADLs/IADLs.   Time (P)  4   Period (P)  Weeks   Status (P)  Achieved     OT SHORT TERM GOAL #3   Title (P)  Pt will demonstrate ability to retrieve a lightweight object at 115 shoulder flexion , and -10 elbow extension with LUE with pain less than or equal to 2/10.  Time (P)  4   Period (P)  Weeks   Status (P)  Achieved     OT SHORT TERM GOAL #4   Title (P)  Pt will demonstrate improved fine motor coordination as evidenced by decreasing LUE 9 hole peg test score to 36 secs or less.   Baseline (P)  RUE: 28.47 secs, LUE 41.19 secs   Time (P)  8   Period (P)  Weeks   Status (P)  Achieved  37.09, 28.50- approximating average meets goal           OT Long Term Goals - 04-01-2016 1352      OT LONG TERM GOAL #1   Title Pt will verbalize understanding of ways to keep thinking skills sharp, memory compensations PRN..   Time 8   Period Weeks   Status On-going  verbalizes memory compensations, needs reinforcement of keeping thinking skills sharp     OT LONG TERM GOAL #2   Title Pt will verbalize understanding of community resources and ways to prevent future PD- related complications.     OT LONG TERM  GOAL #3   Title Pt will decrease 3 button/ unbutton score to 40 secs or less.   Status On-going  69.13 secs               Plan - 04/01/16 1343    Clinical Impression Statement Pt is progressing towards goals. He can benefit from review of PWR! modified quadraped for PWR! up, rock and step. (Twist not issued due to hx of back surgery.   Rehab Potential Good   OT Frequency 2x / week   OT Duration 8 weeks   OT Treatment/Interventions Self-care/ADL training;Moist Heat;Fluidtherapy;DME and/or AE instruction;Patient/family education;Balance training;Therapeutic exercises;Ultrasound;Therapeutic exercise;Therapeutic activities;Passive range of motion;Cognitive remediation/compensation;Functional Mobility Training;Neuromuscular education;Parrafin;Energy conservation;Manual Therapy;Visual/perceptual remediation/compensation   Plan reinforce PWR! modified quadraped   OT Home Exercise Plan Education provided:  PWR! sitting (up, rock, step); PWR! supine (basic 4),coordination; PWR! quadraped (up, rock, step)   Consulted and Agree with Plan of Care Patient;Family member/caregiver      Patient will benefit from skilled therapeutic intervention in order to improve the following deficits and impairments:  Abnormal gait, Decreased coordination, Decreased range of motion, Difficulty walking, Impaired flexibility, Decreased safety awareness, Decreased endurance, Decreased activity tolerance, Decreased knowledge of precautions, Impaired tone, Impaired UE functional use, Pain, Decreased knowledge of use of DME, Decreased balance, Decreased mobility, Decreased strength, Impaired perceived functional ability, Impaired vision/preception  Visit Diagnosis: Other symptoms and signs involving the nervous system  Other symptoms and signs involving the musculoskeletal system  Other lack of coordination  Other abnormalities of gait and mobility      G-Codes - 04-01-2016 1434    Functional Assessment Tool Used  3 button/ unbutton: 69.13 secssecs,  9 hole peg test:LUE 37.09, 28.50 secs   Functional Limitation Carrying, moving and handling objects   Carrying, Moving and Handling Objects Current Status [phone]) At least 20 percent but less than 40 percent impaired, limited or restricted   Carrying, Moving and Handling Objects Goal Status ([id number]) At least 20 percent but less than 40 percent impaired, limited or restricted     Occupational Therapy Progress Note  Dates of Reporting Period: 01/30/16 to April 01, 2016  Objective Reports of Subjective Statement: see above  Objective Measurements: see above  Goal Update: Pt met all short term goals, he is working towards long term goals.  Plan: Continue to work towards unmet goals.  Reason Skilled Services are Required: Pt can  benefit from skilled occupational therapy to address: decreased coordination, rigidity, decreased balance, cognitive deficits, bradykinesia in order to maximize safety and independence with ADLs/IADLs. Problem List Patient Active Problem List   Diagnosis Date Noted  . PD (Parkinson's disease) ([place]) 12/18/2015  . Tremor of both hands 11/22/2015  . PAD (peripheral artery disease) s/p remote left external iliac artery angioplasty 10/28/2012  . Coronary atherosclerosis 10/28/2012  . Hyperlipidemia 10/28/2012  . HTN (hypertension) 10/28/2012  . DM2 (diabetes mellitus, type 2) ([place]) 10/28/2012  . Carotid occlusion, right 10/28/2012    Tristan Proto 03/13/2016, 2:36 PM Theone Murdoch, OTR/L [phone] Phone: [phone] 2:37 PM 03/13/16 [facility] [address] [place], [place], [address] Phone: [phone]   Fax:  [phone]  Name: Rodney Love MRN: [id number] Date of Birth: Apr 19, 1945

## 2016-03-13 NOTE — Patient Instructions (Signed)
  Please complete the assigned speech therapy homework prior to your next session.  

## 2016-03-13 NOTE — Patient Instructions (Addendum)
Memory Compensation Strategies  1. Use "WARM" strategy.  W= write it down  A= associate it  R= repeat it  M= make a mental note  2.   You can keep a Social worker.  Use a 3-ring notebook with sections for the following: calendar, important names and phone numbers,  medications, doctors' names/phone numbers, lists/reminders, and a section to journal what you did  each day.   3.    Use a calendar to write appointments down.  4.    Write yourself a schedule for the day.  This can be placed on the calendar or in a separate section of the Memory Notebook.  Keeping a  regular schedule can help memory.  5.    Use medication organizer with sections for each day or morning/evening  6.    Keep a basket, or pegboard by the door.  Place items that you need to take out with you in the basket or on the pegboard.  You may also want to include a message board for reminders.  7.    Use sticky notes.  Place sticky notes with reminders in a place where the task is performed.  For example: " turn off the  stove" placed by the stove, "lock the door" placed on the door at eye level, " take your medications" on  the bathroom mirror or by the place where you normally take your medications.  8.    Use alarms/timers.  Use while cooking to remind yourself to check on food or as a reminder to take your medicine, or as a  reminder to make a call, or as a reminder to perform another task, etc.     Keeping Thinking Skills Sharp: 1. Jigsaw puzzles 2. Card/board games 3. Talking on the phone/social events 4. Lumosity.com 5. Online games 6. Word serches/crossword puzzles 7.  Logic puzzles 8. Aerobic exercise (stationary bike) 9. Eating balanced diet (fruits & veggies) 10. Drink water 11. Try something new--new recipe, hobby 12. Crafts 13. Do a variety of activities that are challenging 14. Add cognitive activities to walking/exercising (think of animal/food/city with each letter of the alphabet, counting  backwards, thinking of as many vegetables as you can, etc.).--Only do this  If safe (no freezing/falls).

## 2016-03-13 NOTE — Therapy (Signed)
[facility] [address] [place], [place], [address] Phone: [phone]   Fax:  [phone]  Speech Language Pathology Treatment  Patient Details  Name: Rodney Love MRN: [id number] Date of Birth: 1946/02/01 Referring Provider: Dr. Wells Guiles Tat  Encounter Date: 03/13/2016      End of Session - 03/13/16 1503    Visit Number 7   Number of Visits 17   Date for SLP Re-Evaluation 03/28/16   SLP Start Time 1405   SLP Stop Time  1446   SLP Time Calculation (min) 41 min   Activity Tolerance Patient limited by lethargy      Past Medical History:  Diagnosis Date  . Arthritis   . Constipation    OCCASIONAL  . Coronary artery disease   . Diabetes mellitus without complication ([place])   . Environmental allergies   . Hyperlipidemia   . Hypertension   . Inguinal hernia   . Nocturia   . Peripheral arterial disease [facility])     Past Surgical History:  Procedure Laterality Date  . BACK SURGERY    . CARDIAC CATHETERIZATION  05/29/2006   single vessel CAD w/moderate stenosis of the prox. RCA approx 40%,prox. LAD 20-30%,minor irreg. mid abdominal aorta,left iliac artery disease 50-60%  . CATARACT EXTRACTION Right   . HERNIA REPAIR  [id number]  . INGUINAL HERNIA REPAIR N/A 09/23/2012   Procedure: LAPAROSCOPIC INGUINAL HERNIA with umbilical hernia;  Surgeon: Madilyn Hook, DO;  [facility];  Service: General;  Laterality: N/A;  . INSERTION OF MESH Bilateral 09/23/2012   Procedure: INSERTION OF MESH;  Surgeon: Madilyn Hook, DO;  [facility];  Service: General;  Laterality: Bilateral;    There were no vitals filed for this visit.      Subjective Assessment - 03/13/16 1403    Subjective Pt appeared to recall some of previous ST session.   Patient is accompained by: --  daughter               ADULT SLP TREATMENT - 03/13/16 1411      General Information   Behavior/Cognition Alert;Cooperative;Pleasant mood      Treatment Provided   Treatment provided Cognitive-Linquistic     Cognitive-Linquistic Treatment   Treatment focused on Dysarthria;Voice   Skilled Treatment Pt's louder conversation was targeted today with loud /a/ - average 89dB with usual min A for remaining loud throughtout loud /a/ and for full breath and abdominal breathing. In divergent naming tasks pt req'd SBA and responses were all WNL (at or above 70dB with abdominal breathing (AB)). SLP engaged pt in 10 minutes simple conversation with WNL volume with rare min A. Noted when linguistic demand incr'd pt's loudness was sub-70dB.     Assessment / Recommendations / Plan   Plan Continue with current plan of care     Progression Toward Goals   Progression toward goals Progressing toward goals          SLP Education - 03/13/16 1503    Education provided Yes   Education Details how linguistic demand hinders loudness   Person(s) Educated Patient;Child(ren)   Methods Explanation   Comprehension Verbalized understanding          SLP Short Term Goals - 03/13/16 1506      SLP SHORT TERM GOAL #1   Title Pt will average 90dB on loud /a/ with rare min A over 3 sessions   Baseline 02/29/16;   Time 1   Period Weeks   Status On-going  SLP SHORT TERM GOAL #2   Title Pt will average 70dB on 85% of  sentence level structured speech tasks with occasional min A.   Status Achieved     SLP SHORT TERM GOAL #3   Title Pt will maintain average of 70dB during 8 minute simple conversation with occasional min A   Status Achieved          SLP Long Term Goals - 03/13/16 1507      SLP LONG TERM GOAL #1   Title Pt will maintain average of 70dB during 15 minute moderately complex conversation with rare min A   Time 5   Period Weeks   Status On-going     SLP LONG TERM GOAL #2   Title Pt. will maintain audible conversation over 12 minutes in noisy environment outside of therapy room.   Time 5   Period Weeks   Status On-going           Plan - 03/13/16 1504    Clinical Impression Statement Average loud /a/ was near 90dB today with usual min A for loudness and A needed for breath support/full breath. In simple conversation pt maintained 70dB average yet when linguistic demand incr'd loudness decreased. Skilled ST remains needed to produce WNL volume in conversation across speaking situations.    Speech Therapy Frequency 2x / week   Duration --  8 weeks   Treatment/Interventions Compensatory strategies;Patient/family education;Functional tasks;Environmental controls;SLP instruction and feedback   Potential to Achieve Goals Good   SLP Home Exercise Plan loud /a/   Consulted and Agree with Plan of Care Patient;Family member/caregiver      Patient will benefit from skilled therapeutic intervention in order to improve the following deficits and impairments:   Dysarthria and anarthria    Problem List Patient Active Problem List   Diagnosis Date Noted  . PD (Parkinson's disease) ([place]) 12/18/2015  . Tremor of both hands 11/22/2015  . PAD (peripheral artery disease) s/p remote left external iliac artery angioplasty 10/28/2012  . Coronary atherosclerosis 10/28/2012  . Hyperlipidemia 10/28/2012  . HTN (hypertension) 10/28/2012  . DM2 (diabetes mellitus, type 2) ([place]) 10/28/2012  . Carotid occlusion, right 10/28/2012    [facility] ,MS, CCC-SLP  03/13/2016, 3:07 PM  [facility] [address] [place], [place], [address] Phone: [phone]   Fax:  [phone]   Name: Rodney Love MRN: [id number] Date of Birth: Aug 15, 1945

## 2016-03-14 NOTE — Therapy (Signed)
[facility] [address] [place], [place], [address] Phone: [phone]   Fax:  [phone]  Physical Therapy Treatment  Patient Details  Name: Rodney Love MRN: [id number] Date of Birth: 1946/03/24 Referring Provider: Wells Guiles Tat  Encounter Date: 03/13/2016      PT End of Session - 03/14/16 1819    Visit Number 7   Number of Visits 17   Date for PT Re-Evaluation 03/30/16   Authorization Type UHC Medicare-GCODE every 10th visit   PT Start Time 1152   PT Stop Time 1230   PT Time Calculation (min) 38 min   Activity Tolerance Patient tolerated treatment well   Behavior During Therapy [facility] for tasks assessed/performed      Past Medical History:  Diagnosis Date  . Arthritis   . Constipation    OCCASIONAL  . Coronary artery disease   . Diabetes mellitus without complication ([place])   . Environmental allergies   . Hyperlipidemia   . Hypertension   . Inguinal hernia   . Nocturia   . Peripheral arterial disease [facility])     Past Surgical History:  Procedure Laterality Date  . BACK SURGERY    . CARDIAC CATHETERIZATION  05/29/2006   single vessel CAD w/moderate stenosis of the prox. RCA approx 40%,prox. LAD 20-30%,minor irreg. mid abdominal aorta,left iliac artery disease 50-60%  . CATARACT EXTRACTION Right   . HERNIA REPAIR  [id number]  . INGUINAL HERNIA REPAIR N/A 09/23/2012   Procedure: LAPAROSCOPIC INGUINAL HERNIA with umbilical hernia;  Surgeon: Madilyn Hook, DO;  [facility];  Service: General;  Laterality: N/A;  . INSERTION OF MESH Bilateral 09/23/2012   Procedure: INSERTION OF MESH;  Surgeon: Madilyn Hook, DO;  [facility];  Service: General;  Laterality: Bilateral;    There were no vitals filed for this visit.      Subjective Assessment - 03/13/16 1158    Subjective Therapy has seemed to help my shoulder and back.  No falls.   Patient is accompained by: Family member   Pertinent History  Ruptured disc in low back   Patient Stated Goals Pt's goals for therapy is to improve walking and balance.   Pain Score 6    Pain Location Shoulder   Pain Orientation Left   Pain Descriptors / Indicators Aching   Pain Type Chronic pain   Pain Onset More than a month ago   Pain Frequency Intermittent   Aggravating Factors  shoulder flexion   Pain Relieving Factors exercises helped                         [facility] Adult PT Treatment/Exercise - 03/14/16 1825      Transfers   Sit to Stand 6: Modified independent (Device/Increase time);Without upper extremity assist;From chair/3-in-1   Five time sit to stand comments  12.31   Stand to Sit 6: Modified independent (Device/Increase time)     Standardized Balance Assessment   Standardized Balance Assessment Timed Up and Go Test     Timed Up and Go Test   TUG Normal TUG   Normal TUG (seconds) 12.08     High Level Balance   High Level Balance Comments Single limb stance:  RLE 2.16 sec, 4.84 sec; LLE 2.27 sec, 11.30 sec     Self-Care   Self-Care Other Self-Care Comments   Other Self-Care Comments  Discussed progress towards goals, plans to continue to address balance and gait as part of working  towards LTGs.  Provided information on [employer] over Parkinson's community education group.  Briefly discussed community options for exercise, including PWR! Moves classes           PWR [facility]) - 03/14/16 1817    PWR! exercises Moves in standing   PWR! Up x 20   PWR! Rock x 20   PWR! Twist x 10   PWR Step x 20   Comments PWR! MOves in standing at counter-cues provided for increased intensity of movement             PT Education - 03/14/16 1818    Education provided Yes   Education Details Checked STGs and discussed progress with PT, discussed POC; provided community PD resources   Person(s) Educated Patient;Child(ren)   Methods Explanation;Handout   Comprehension Verbalized understanding          PT Short Term  Goals - 03/13/16 1159      PT SHORT TERM GOAL #1   Title Pt will be independent with HEP for improved posture, transfers, balance and gait.  TARGET 02/19/16   Time 4   Period Weeks   Status Partially Met     PT SHORT TERM GOAL #2   Title Pt will improve 5x sit<>stand transfers to less than or equal to 15 seconds for improved efficiency and safety of transfers.   Time 4   Period Weeks   Status Achieved     PT SHORT TERM GOAL #3   Title Pt will improve TUG score to less than or equal to 13.5 seconds for decreased fall risk.   Time 4   Period Weeks   Status Achieved     PT SHORT TERM GOAL #4   Title Pt will improve SLS to at least 3 seconds each leg, for improved obstacle and step negotiation.   Time 4   Period Weeks   Status Partially Met     PT SHORT TERM GOAL #5   Title Pt will verbalize understanding of local Parkinson's disease resources.   Time 4   Period Weeks   Status New           PT Long Term Goals - 01/30/16 1419      PT LONG TERM GOAL #1   Title Pt will verbalize understanding of fall prevention in home environment.  TARGET 03/30/16   Time 8   Period Weeks   Status New     PT LONG TERM GOAL #2   Title Pt will improve 5x sit<>stand to less than or equal to 11.5 seconds for improved transfer efficiency and safety.   Time 8   Period Weeks   Status New     PT LONG TERM GOAL #3   Title Pt will improve TUG cognitive score to less than or equal to 15 seconds for decreased fall risk.   Time 8   Period Weeks   Status New     PT LONG TERM GOAL #4   Title Pt will improve MiniBESTest to at least 24/28 for decreased fall risk.   Time 8   Period Weeks   Status New     PT LONG TERM GOAL #5   Title Pt will verbalize plans for continued community fitness upon D/C from PT.   Time 8   Period Weeks   Status New               Plan - 03/14/16 1820    Clinical Impression Statement Assessed STGs today (delay in  assessing due to inconsistent attendance  due to scheduling).  Pt has partially met STG 1 and 4, met STG 2 and 3.  Pt reports improvements with mobility, but continues to feel that balance shoudl still be addressed.  Plan to continue POC with focus on balance and gait activities.   Rehab Potential Good   PT Frequency 2x / week   PT Duration 8 weeks  plus eval   PT Treatment/Interventions ADLs/Self Care Home Management;Functional mobility training;Gait training;Therapeutic activities;Therapeutic exercise;Balance training;Neuromuscular re-education;Patient/family education   PT Next Visit Plan Continue gait and balance activities; varied direction stepping, changes of direction.  Discuss additional appts if needed.   Consulted and Agree with Plan of Care Patient;Family member/caregiver   Family Member Consulted daughter      Patient will benefit from skilled therapeutic intervention in order to improve the following deficits and impairments:  Abnormal gait, Decreased balance, Decreased mobility, Decreased strength, Difficulty walking, Impaired flexibility, Postural dysfunction  Visit Diagnosis: Other abnormalities of gait and mobility  Unsteadiness on feet     Problem List Patient Active Problem List   Diagnosis Date Noted  . PD (Parkinson's disease) ([place]) 12/18/2015  . Tremor of both hands 11/22/2015  . PAD (peripheral artery disease) s/p remote left external iliac artery angioplasty 10/28/2012  . Coronary atherosclerosis 10/28/2012  . Hyperlipidemia 10/28/2012  . HTN (hypertension) 10/28/2012  . DM2 (diabetes mellitus, type 2) ([place]) 10/28/2012  . Carotid occlusion, right 10/28/2012    Melbert Botelho W. 03/14/2016, 6:26 PM Frazier Butt., PT [facility] [address] [place], [place], [address] Phone: [phone]   Fax:  [phone]  Name: Rodney Love MRN: [id number] Date of Birth: September 21, 1945

## 2016-03-15 ENCOUNTER — Ambulatory Visit: Payer: Medicare Other | Admitting: Occupational Therapy

## 2016-03-15 ENCOUNTER — Ambulatory Visit: Payer: Medicare Other | Admitting: Speech Pathology

## 2016-03-15 ENCOUNTER — Ambulatory Visit: Payer: Medicare Other | Admitting: Physical Therapy

## 2016-03-15 DIAGNOSIS — R293 Abnormal posture: Secondary | ICD-10-CM | POA: Diagnosis not present

## 2016-03-15 DIAGNOSIS — R29818 Other symptoms and signs involving the nervous system: Secondary | ICD-10-CM

## 2016-03-15 DIAGNOSIS — R2681 Unsteadiness on feet: Secondary | ICD-10-CM

## 2016-03-15 DIAGNOSIS — R29898 Other symptoms and signs involving the musculoskeletal system: Secondary | ICD-10-CM | POA: Diagnosis not present

## 2016-03-15 DIAGNOSIS — R278 Other lack of coordination: Secondary | ICD-10-CM | POA: Diagnosis not present

## 2016-03-15 DIAGNOSIS — R2689 Other abnormalities of gait and mobility: Secondary | ICD-10-CM | POA: Diagnosis not present

## 2016-03-15 DIAGNOSIS — R471 Dysarthria and anarthria: Secondary | ICD-10-CM

## 2016-03-15 DIAGNOSIS — M25612 Stiffness of left shoulder, not elsewhere classified: Secondary | ICD-10-CM | POA: Diagnosis not present

## 2016-03-15 NOTE — Therapy (Signed)
[facility] [address] [place], [place], [address] Phone: [phone]   Fax:  [phone]  Occupational Therapy Treatment  Patient Details  Name: Rodney Love MRN: [id number] Date of Birth: June 07, 1945 Referring Provider: Dr. Carles Collet  Encounter Date: 03/15/2016      OT End of Session - 03/15/16 1323    Visit Number 11   Number of Visits 17   Date for OT Re-Evaluation 03/29/16   Authorization Type UHC Medicare   Authorization - Visit Number 11   Authorization - Number of Visits 20   OT Start Time [id number]   OT Stop Time 1400   OT Time Calculation (min) 41 min   Activity Tolerance Patient tolerated treatment well   Behavior During Therapy [facility] for tasks assessed/performed      Past Medical History:  Diagnosis Date  . Arthritis   . Constipation    OCCASIONAL  . Coronary artery disease   . Diabetes mellitus without complication ([place])   . Environmental allergies   . Hyperlipidemia   . Hypertension   . Inguinal hernia   . Nocturia   . Peripheral arterial disease [facility])     Past Surgical History:  Procedure Laterality Date  . BACK SURGERY    . CARDIAC CATHETERIZATION  05/29/2006   single vessel CAD w/moderate stenosis of the prox. RCA approx 40%,prox. LAD 20-30%,minor irreg. mid abdominal aorta,left iliac artery disease 50-60%  . CATARACT EXTRACTION Right   . HERNIA REPAIR  [id number]  . INGUINAL HERNIA REPAIR N/A 09/23/2012   Procedure: LAPAROSCOPIC INGUINAL HERNIA with umbilical hernia;  Surgeon: Madilyn Hook, DO;  Location: [facility];  Service: General;  Laterality: N/A;  . INSERTION OF MESH Bilateral 09/23/2012   Procedure: INSERTION OF MESH;  Surgeon: Madilyn Hook, DO;  Location: [facility];  Service: General;  Laterality: Bilateral;    There were no vitals filed for this visit.      Subjective Assessment - 03/15/16 1318    Pertinent History see Epic, hx of back surgery   Patient Stated Goals improve use of LUE    Currently in Pain? No/denies         Reviewed strategies for donning/ doffing jacket, and fastening buttons with adapted strategy. Pt demonstrated improved performance following review. Dynamic step and reach to copy small peg design on a vertical surface, with left and right hands, min v.c. For finer extension and larger amplitude movements.                PWR [facility]) - 03/15/16    PWR! exercises Moves in modified quadraped   PWR! Up x 20   PWR! Rock x 20   PWR! Twist    PWR Step x 20   Comments PWR! MOves modified quadraped in standing at table provided for increased intensity of movement             OT Education - 03/15/16 1554    Education provided Yes   Education Details Reviewed PWR! modified quadraped (PWR! up, rock and step), ways to keep thinking skills sharp   Person(s) Educated Patient;Other (comment)  granddtr   Methods Explanation;Demonstration   Comprehension Verbalized understanding;Returned demonstration;Verbal cues required          OT Short Term Goals - 03/15/16 1321      OT SHORT TERM GOAL #1   Title I with PD specific HEP. due 02/29/16   Time 4   Period Weeks   Status Achieved  OT SHORT TERM GOAL #2   Title Pt will verbalize understanding of adapted strateiges for ADLs/IADLs.   Time 4   Period Weeks   Status Achieved     OT SHORT TERM GOAL #3   Title Pt will demonstrate ability to retrieve a lightweight object at 115 shoulder flexion , and -10 elbow extension with LUE with pain less than or equal to 2/10.   Time 4   Period Weeks   Status Achieved     OT SHORT TERM GOAL #4   Title Pt will demonstrate improved fine motor coordination as evidenced by decreasing LUE 9 hole peg test score to 36 secs or less.   Baseline RUE: 28.47 secs, LUE 41.19 secs   Time 8   Period Weeks   Status Achieved  37.09, 28.50- approximating average meets goal           OT Long Term Goals - 03/15/16 1336      OT LONG TERM GOAL #1    Title Pt will verbalize understanding of ways to keep thinking skills sharp, memory compensations PRN..   Time 8   Period Weeks   Status Achieved     OT LONG TERM GOAL #2   Title Pt will verbalize understanding of community resources and ways to prevent future PD- related complications.   Time 8   Period Weeks   Status On-going     OT LONG TERM GOAL #3   Title Pt will decrease 3 button/ unbutton score to 40 secs or less.   Status On-going  69.13 secs     OT LONG TERM GOAL #4   Title Pt will  demonstrate improved ease with dressing as evidenced by performing PPT# 4(don/doff jacket) in 24 secs or less   Time 8   Period Weeks   Status Achieved  18.47 secs               Plan - 03/15/16 1552    Clinical Impression Statement Pt demonstrates improved performance of donning doffing his hjacket using adapted strategies. Pt's grand dtr present and she remarked that the pt is doing much better.   Rehab Potential Good   OT Frequency 2x / week   OT Duration 8 weeks   OT Treatment/Interventions Self-care/ADL training;Moist Heat;Fluidtherapy;DME and/or AE instruction;Patient/family education;Balance training;Therapeutic exercises;Ultrasound;Therapeutic exercise;Therapeutic activities;Passive range of motion;Cognitive remediation/compensation;Functional Mobility Training;Neuromuscular education;Parrafin;Energy conservation;Manual Therapy;Visual/perceptual remediation/compensation   OT Home Exercise Plan Education provided:  PWR! sitting (up, rock, step); PWR! supine (basic 4),coordination; PWR! quadraped (up, rock, step)      Patient will benefit from skilled therapeutic intervention in order to improve the following deficits and impairments:  Abnormal gait, Decreased coordination, Decreased range of motion, Difficulty walking, Impaired flexibility, Decreased safety awareness, Decreased endurance, Decreased activity tolerance, Decreased knowledge of precautions, Impaired tone, Impaired UE  functional use, Pain, Decreased knowledge of use of DME, Decreased balance, Decreased mobility, Decreased strength, Impaired perceived functional ability, Impaired vision/preception  Visit Diagnosis: Other symptoms and signs involving the nervous system  Other symptoms and signs involving the musculoskeletal system  Other lack of coordination  Other abnormalities of gait and mobility    Problem List Patient Active Problem List   Diagnosis Date Noted  . PD (Parkinson's disease) ([place]) 12/18/2015  . Tremor of both hands 11/22/2015  . PAD (peripheral artery disease) s/p remote left external iliac artery angioplasty 10/28/2012  . Coronary atherosclerosis 10/28/2012  . Hyperlipidemia 10/28/2012  . HTN (hypertension) 10/28/2012  . DM2 (diabetes mellitus, type 2) (  [place]) 10/28/2012  . Carotid occlusion, right 10/28/2012    Amairani Shuey 03/15/2016, 3:56 PM  [facility] [address] [place], [place], [address] Phone: [phone]   Fax:  [phone]  Name: Rodney Love MRN: [id number] Date of Birth: 22-Sep-1945

## 2016-03-15 NOTE — Therapy (Signed)
[facility] [address] [place], [place], [address] Phone: [phone]   Fax:  [phone]  Speech Language Pathology Treatment  Patient Details  Name: Rodney Love MRN: [id number] Date of Birth: July 07, 1945 Referring Provider: Dr. Wells Guiles Tat  Encounter Date: 03/15/2016      End of Session - 03/15/16 1318    Visit Number 8   Number of Visits 17   Date for SLP Re-Evaluation 03/28/16   SLP Start Time 1232   SLP Stop Time  1315   SLP Time Calculation (min) 43 min   Activity Tolerance Patient tolerated treatment well      Past Medical History:  Diagnosis Date  . Arthritis   . Constipation    OCCASIONAL  . Coronary artery disease   . Diabetes mellitus without complication ([place])   . Environmental allergies   . Hyperlipidemia   . Hypertension   . Inguinal hernia   . Nocturia   . Peripheral arterial disease [facility])     Past Surgical History:  Procedure Laterality Date  . BACK SURGERY    . CARDIAC CATHETERIZATION  05/29/2006   single vessel CAD w/moderate stenosis of the prox. RCA approx 40%,prox. LAD 20-30%,minor irreg. mid abdominal aorta,left iliac artery disease 50-60%  . CATARACT EXTRACTION Right   . HERNIA REPAIR  [id number]  . INGUINAL HERNIA REPAIR N/A 09/23/2012   Procedure: LAPAROSCOPIC INGUINAL HERNIA with umbilical hernia;  Surgeon: Madilyn Hook, DO;  [facility];  Service: General;  Laterality: N/A;  . INSERTION OF MESH Bilateral 09/23/2012   Procedure: INSERTION OF MESH;  Surgeon: Madilyn Hook, DO;  [facility];  Service: General;  Laterality: Bilateral;    There were no vitals filed for this visit.      Subjective Assessment - 03/15/16 1236    Subjective Pt's grand daughter is present today - she reports that pt is louder over the phone when she calls him from college.               ADULT SLP TREATMENT - 03/15/16 1238      General Information   Behavior/Cognition  Alert;Cooperative;Pleasant mood     Treatment Provided   Treatment provided Cognitive-Linquistic     Cognitive-Linquistic Treatment   Treatment focused on Dysarthria;Voice   Skilled Treatment Loud /a/ average 90dB - with rare min A. Simple conversation with topic starter  with average of 70dB - in mildly noisy environement with occasional min a.      Assessment / Recommendations / Plan   Plan Continue with current plan of care     Progression Toward Goals   Progression toward goals Progressing toward goals          SLP Education - 03/15/16 1307    Education provided Yes          SLP Short Term Goals - 03/15/16 1318      SLP SHORT TERM GOAL #1   Title Pt will average 90dB on loud /a/ with rare min A over 3 sessions   Baseline 02/29/16;   Time 1   Period Weeks   Status On-going     SLP SHORT TERM GOAL #2   Title Pt will average 70dB on 85% of  sentence level structured speech tasks with occasional min A.   Status Achieved     SLP SHORT TERM GOAL #3   Title Pt will maintain average of 70dB during 8 minute simple conversation with occasional min A   Status  Achieved          SLP Long Term Goals - 03/15/16 1318      SLP LONG TERM GOAL #1   Title Pt will maintain average of 70dB during 15 minute moderately complex conversation with rare min A   Time 5   Period Weeks   Status On-going     SLP LONG TERM GOAL #2   Title Pt. will maintain audible conversation over 12 minutes in noisy environment outside of therapy room.   Time 5   Period Weeks   Status On-going          Plan - 03/15/16 1307    Clinical Impression Statement Pt continues to required visual and verbal cues to carryover loud volume, especially when cognitive linguistic load increases. Word finding impairment also affects carryover of loud volume. Pt and grandaughter trained in compensations for word finding diffiuclty. Cotinue skilled ST to maximise carryover of volume.   Speech Therapy Frequency  2x / week   Treatment/Interventions Compensatory strategies;Patient/family education;Functional tasks;Environmental controls;SLP instruction and feedback   Potential to Achieve Goals Good   SLP Home Exercise Plan loud /a/   Consulted and Agree with Plan of Care Patient;Family member/caregiver   Family Member Consulted grandaughter      Patient will benefit from skilled therapeutic intervention in order to improve the following deficits and impairments:   Dysarthria and anarthria    Problem List Patient Active Problem List   Diagnosis Date Noted  . PD (Parkinson's disease) ([place]) 12/18/2015  . Tremor of both hands 11/22/2015  . PAD (peripheral artery disease) s/p remote left external iliac artery angioplasty 10/28/2012  . Coronary atherosclerosis 10/28/2012  . Hyperlipidemia 10/28/2012  . HTN (hypertension) 10/28/2012  . DM2 (diabetes mellitus, type 2) ([place]) 10/28/2012  . Carotid occlusion, right 10/28/2012    Lavetta Geier, Annye Rusk MS, CCC-SLP 03/15/2016, 1:19 PM  [facility] [address] [place], [place], [address] Phone: [phone]   Fax:  [phone]   Name: Rodney Love MRN: [id number] Date of Birth: Jul 01, 1945

## 2016-03-15 NOTE — Therapy (Signed)
[facility] [address] [place], [place], [address] Phone: [phone]   Fax:  [phone]  Physical Therapy Treatment  Patient Details  Name: Rodney Love MRN: [id number] Date of Birth: 01-Apr-1946 Referring Provider: Wells Guiles Tat  Encounter Date: 03/15/2016      PT End of Session - 03/15/16 1332    Visit Number 8   Number of Visits 17   Date for PT Re-Evaluation 03/30/16   Authorization Type UHC Medicare-GCODE every 10th visit   PT Start Time 1148   PT Stop Time 1228   PT Time Calculation (min) 40 min   Equipment Utilized During Treatment Gait belt   Activity Tolerance Patient tolerated treatment well   Behavior During Therapy [facility] for tasks assessed/performed      Past Medical History:  Diagnosis Date  . Arthritis   . Constipation    OCCASIONAL  . Coronary artery disease   . Diabetes mellitus without complication ([place])   . Environmental allergies   . Hyperlipidemia   . Hypertension   . Inguinal hernia   . Nocturia   . Peripheral arterial disease [facility])     Past Surgical History:  Procedure Laterality Date  . BACK SURGERY    . CARDIAC CATHETERIZATION  05/29/2006   single vessel CAD w/moderate stenosis of the prox. RCA approx 40%,prox. LAD 20-30%,minor irreg. mid abdominal aorta,left iliac artery disease 50-60%  . CATARACT EXTRACTION Right   . HERNIA REPAIR  [id number]  . INGUINAL HERNIA REPAIR N/A 09/23/2012   Procedure: LAPAROSCOPIC INGUINAL HERNIA with umbilical hernia;  Surgeon: Madilyn Hook, DO;  [facility]: [facility];  Service: General;  Laterality: N/A;  . INSERTION OF MESH Bilateral 09/23/2012   Procedure: INSERTION OF MESH;  Surgeon: Madilyn Hook, DO;  [facility]: [facility];  Service: General;  Laterality: Bilateral;    There were no vitals filed for this visit.      Subjective Assessment - 03/15/16 1150    Subjective No changes since last visit.  Raked some leaves yesterday   Patient is accompained  by: Family member   Pertinent History Ruptured disc in low back   Patient Stated Goals Pt's goals for therapy is to improve walking and balance.   Currently in Pain? Yes   Pain Score 1    Pain Location Hip   Pain Orientation Right;Left   Pain Descriptors / Indicators Sore   Pain Type Acute pain   Pain Onset Yesterday  from raking leaves yesterday   Aggravating Factors  from raking leaves yesterday   Pain Relieving Factors exercise and walking helps                         [facility] Adult PT Treatment/Exercise - 03/15/16 0001      Transfers   Sit to Stand 6: Modified independent (Device/Increase time);Without upper extremity assist;From chair/3-in-1;From elevated surface   Stand to Sit 6: Modified independent (Device/Increase time);Without upper extremity assist;To elevated surface;To chair/3-in-1   Number of Reps 10 reps  each from mat surface, then 18" chair   Transfer Cueing Cues provided for upright posture upon standing     Ambulation/Gait   Gait Comments Gait activities x 230 ft with cues for upright posture, cues ofr arm swing and environmental scanning             Balance Exercises - 03/15/16 1152      Balance Exercises: Standing   Stepping Strategy Anterior;Posterior;Lateral;UE support;10 reps  Alternating legs,  cues for improved weightshift   Step Ups Forward;6 inch;UE support 2  x 10 reps   Retro Gait Foam/compliant surface;Upper extremity support;3 reps  Forward/back at [employer] support;Upper extremity support;3 reps  At counter   Marching Limitations Marching in place x 10 reps with UE support  then on compliant surface   Other Standing Exercises On compliant surface-forward/back marching x 3 reps at counter.  SLS and weightshifting activities with varied direction step taps to balance disks, then single/double step taps to cones with intermittent UE support.  Cues for upright posture, use of visual target              PT Short Term Goals - 03/13/16 1159      PT SHORT TERM GOAL #1   Title Pt will be independent with HEP for improved posture, transfers, balance and gait.  TARGET 02/19/16   Time 4   Period Weeks   Status Partially Met     PT SHORT TERM GOAL #2   Title Pt will improve 5x sit<>stand transfers to less than or equal to 15 seconds for improved efficiency and safety of transfers.   Time 4   Period Weeks   Status Achieved     PT SHORT TERM GOAL #3   Title Pt will improve TUG score to less than or equal to 13.5 seconds for decreased fall risk.   Time 4   Period Weeks   Status Achieved     PT SHORT TERM GOAL #4   Title Pt will improve SLS to at least 3 seconds each leg, for improved obstacle and step negotiation.   Time 4   Period Weeks   Status Partially Met     PT SHORT TERM GOAL #5   Title Pt will verbalize understanding of local Parkinson's disease resources.   Time 4   Period Weeks   Status New           PT Long Term Goals - 01/30/16 1419      PT LONG TERM GOAL #1   Title Pt will verbalize understanding of fall prevention in home environment.  TARGET 03/30/16   Time 8   Period Weeks   Status New     PT LONG TERM GOAL #2   Title Pt will improve 5x sit<>stand to less than or equal to 11.5 seconds for improved transfer efficiency and safety.   Time 8   Period Weeks   Status New     PT LONG TERM GOAL #3   Title Pt will improve TUG cognitive score to less than or equal to 15 seconds for decreased fall risk.   Time 8   Period Weeks   Status New     PT LONG TERM GOAL #4   Title Pt will improve MiniBESTest to at least 24/28 for decreased fall risk.   Time 8   Period Weeks   Status New     PT LONG TERM GOAL #5   Title Pt will verbalize plans for continued community fitness upon D/C from PT.   Time 8   Period Weeks   Status New               Plan - 03/15/16 1333    Clinical Impression Statement Focused treatment session on SLS and  compliant surface activities today.  Pt needs intermittent UE support with SLS activities.  Pt will continue to benefit from further skilled PT to address balance and gait.  Rehab Potential Good   PT Frequency 2x / week   PT Duration 8 weeks  plus eval   PT Treatment/Interventions ADLs/Self Care Home Management;Functional mobility training;Gait training;Therapeutic activities;Therapeutic exercise;Balance training;Neuromuscular re-education;Patient/family education   PT Next Visit Plan Continue gait and balance activities; varied direction stepping, changes of direction.     Consulted and Agree with Plan of Care Patient;Family member/caregiver   Family Member Consulted granddaughter      Patient will benefit from skilled therapeutic intervention in order to improve the following deficits and impairments:  Abnormal gait, Decreased balance, Decreased mobility, Decreased strength, Difficulty walking, Impaired flexibility, Postural dysfunction  Visit Diagnosis: Other abnormalities of gait and mobility  Unsteadiness on feet     Problem List Patient Active Problem List   Diagnosis Date Noted  . PD (Parkinson's disease) ([place]) 12/18/2015  . Tremor of both hands 11/22/2015  . PAD (peripheral artery disease) s/p remote left external iliac artery angioplasty 10/28/2012  . Coronary atherosclerosis 10/28/2012  . Hyperlipidemia 10/28/2012  . HTN (hypertension) 10/28/2012  . DM2 (diabetes mellitus, type 2) ([place]) 10/28/2012  . Carotid occlusion, right 10/28/2012    Faelynn Wynder W. 03/15/2016, 1:37 PM  Frazier Butt., PT  [facility] [address] [place], [place], [address] Phone: [phone]   Fax:  [phone]  Name: KARMINE KAUER MRN: [id number] Date of Birth: 04-12-1945

## 2016-03-18 DIAGNOSIS — Z125 Encounter for screening for malignant neoplasm of prostate: Secondary | ICD-10-CM | POA: Diagnosis not present

## 2016-03-18 DIAGNOSIS — E118 Type 2 diabetes mellitus with unspecified complications: Secondary | ICD-10-CM | POA: Diagnosis not present

## 2016-03-18 DIAGNOSIS — I10 Essential (primary) hypertension: Secondary | ICD-10-CM | POA: Diagnosis not present

## 2016-03-18 DIAGNOSIS — E785 Hyperlipidemia, unspecified: Secondary | ICD-10-CM | POA: Diagnosis not present

## 2016-03-18 DIAGNOSIS — Z0000 Encounter for general adult medical examination without abnormal findings: Secondary | ICD-10-CM | POA: Diagnosis not present

## 2016-03-18 DIAGNOSIS — E559 Vitamin D deficiency, unspecified: Secondary | ICD-10-CM | POA: Diagnosis not present

## 2016-03-19 ENCOUNTER — Ambulatory Visit: Payer: Medicare Other

## 2016-03-19 ENCOUNTER — Ambulatory Visit: Payer: Medicare Other | Admitting: Physical Therapy

## 2016-03-19 ENCOUNTER — Ambulatory Visit: Payer: Medicare Other | Admitting: Occupational Therapy

## 2016-03-19 DIAGNOSIS — R2689 Other abnormalities of gait and mobility: Secondary | ICD-10-CM | POA: Diagnosis not present

## 2016-03-19 DIAGNOSIS — R293 Abnormal posture: Secondary | ICD-10-CM | POA: Diagnosis not present

## 2016-03-19 DIAGNOSIS — R278 Other lack of coordination: Secondary | ICD-10-CM | POA: Diagnosis not present

## 2016-03-19 DIAGNOSIS — R2681 Unsteadiness on feet: Secondary | ICD-10-CM

## 2016-03-19 DIAGNOSIS — R29818 Other symptoms and signs involving the nervous system: Secondary | ICD-10-CM | POA: Diagnosis not present

## 2016-03-19 DIAGNOSIS — R29898 Other symptoms and signs involving the musculoskeletal system: Secondary | ICD-10-CM

## 2016-03-19 DIAGNOSIS — R471 Dysarthria and anarthria: Secondary | ICD-10-CM | POA: Diagnosis not present

## 2016-03-19 DIAGNOSIS — M25612 Stiffness of left shoulder, not elsewhere classified: Secondary | ICD-10-CM | POA: Diagnosis not present

## 2016-03-19 NOTE — Therapy (Signed)
[facility] [address] [place], [place], [address] Phone: [phone]   Fax:  [phone]  Occupational Therapy Treatment  Patient Details  Name: AADAM ARGUDO MRN: [id number] Date of Birth: 05/18/1945 Referring Provider: Dr. Carles Collet  Encounter Date: 03/19/2016      OT End of Session - 03/19/16 1417    Visit Number 12   Number of Visits 17   Date for OT Re-Evaluation 03/29/16   Authorization Type UHC Medicare   Authorization - Visit Number 12   Authorization - Number of Visits 20   OT Start Time [id number]   OT Stop Time 1400   OT Time Calculation (min) 45 min   Activity Tolerance Patient tolerated treatment well      Past Medical History:  Diagnosis Date  . Arthritis   . Constipation    OCCASIONAL  . Coronary artery disease   . Diabetes mellitus without complication ([place])   . Environmental allergies   . Hyperlipidemia   . Hypertension   . Inguinal hernia   . Nocturia   . Peripheral arterial disease [facility])     Past Surgical History:  Procedure Laterality Date  . BACK SURGERY    . CARDIAC CATHETERIZATION  05/29/2006   single vessel CAD w/moderate stenosis of the prox. RCA approx 40%,prox. LAD 20-30%,minor irreg. mid abdominal aorta,left iliac artery disease 50-60%  . CATARACT EXTRACTION Right   . HERNIA REPAIR  [id number]  . INGUINAL HERNIA REPAIR N/A 09/23/2012   Procedure: LAPAROSCOPIC INGUINAL HERNIA with umbilical hernia;  Surgeon: Madilyn Hook, DO;  Location: [facility];  Service: General;  Laterality: N/A;  . INSERTION OF MESH Bilateral 09/23/2012   Procedure: INSERTION OF MESH;  Surgeon: Madilyn Hook, DO;  Location: [facility];  Service: General;  Laterality: Bilateral;    There were no vitals filed for this visit.      Subjective Assessment - 03/19/16 1318    Subjective  My Lt shoulder gives me trouble getting a jacket on   Pertinent History see Epic, hx of back surgery   Patient Stated Goals improve use  of LUE   Currently in Pain? Yes   Pain Location Hip   Pain Orientation Right;Left   Pain Descriptors / Indicators Sore   Pain Type Acute pain   Pain Onset Yesterday   Pain Frequency Intermittent   Aggravating Factors  twisting a certain way   Pain Relieving Factors PWR! exercises                      OT Treatments/Exercises (OP) - 03/19/16 0001      ADLs   UB Dressing Practiced donning/doffing jacket and recommended pt always don LUE first. Also, have larger jacket or coat size for easier donning   ADL Comments Discussed POP support group - pt already has info, but encouraged pt/wife to attend. ALso discussed continuing with PWR! class upon d/c from therapy. Pt did not seem very interested     Fine Motor Coordination   Other Fine Motor Exercises Reviewed coordination HEP with cues to use large amplitude movements. Reviewed PWR! Hand flicks     Neurological Re-education Exercises   Other Exercises 1 PWR! Moves in modified quadraped x 10 reps each with cues to perform correclty. Pt with no complaints of back pain                  OT Short Term Goals - 03/15/16 1321      OT  SHORT TERM GOAL #1   Title I with PD specific HEP. due 02/29/16   Time 4   Period Weeks   Status Achieved     OT SHORT TERM GOAL #2   Title Pt will verbalize understanding of adapted strateiges for ADLs/IADLs.   Time 4   Period Weeks   Status Achieved     OT SHORT TERM GOAL #3   Title Pt will demonstrate ability to retrieve a lightweight object at 115 shoulder flexion , and -10 elbow extension with LUE with pain less than or equal to 2/10.   Time 4   Period Weeks   Status Achieved     OT SHORT TERM GOAL #4   Title Pt will demonstrate improved fine motor coordination as evidenced by decreasing LUE 9 hole peg test score to 36 secs or less.   Baseline RUE: 28.47 secs, LUE 41.19 secs   Time 8   Period Weeks   Status Achieved  37.09, 28.50- approximating average meets goal            OT Long Term Goals - 03/15/16 1336      OT LONG TERM GOAL #1   Title Pt will verbalize understanding of ways to keep thinking skills sharp, memory compensations PRN..   Time 8   Period Weeks   Status Achieved     OT LONG TERM GOAL #2   Title Pt will verbalize understanding of community resources and ways to prevent future PD- related complications.   Time 8   Period Weeks   Status On-going     OT LONG TERM GOAL #3   Title Pt will decrease 3 button/ unbutton score to 40 secs or less.   Status On-going  69.13 secs     OT LONG TERM GOAL #4   Title Pt will  demonstrate improved ease with dressing as evidenced by performing PPT# 4(don/doff jacket) in 24 secs or less   Time 8   Period Weeks   Status Achieved  18.47 secs               Plan - 03/19/16 1418    Clinical Impression Statement Pt continues to need min cues for adaptive strategies and to use large amplitude movements. Pt improving with functional tasks   Rehab Potential Good   OT Frequency 2x / week   OT Duration 8 weeks   OT Treatment/Interventions Self-care/ADL training;Moist Heat;Fluidtherapy;DME and/or AE instruction;Patient/family education;Balance training;Therapeutic exercises;Ultrasound;Therapeutic exercise;Therapeutic activities;Passive range of motion;Cognitive remediation/compensation;Functional Mobility Training;Neuromuscular education;Parrafin;Energy conservation;Manual Therapy;Visual/perceptual remediation/compensation   Plan issue ex chart for better carryover at home, UBE, progress towards LTG's   OT Home Exercise Plan Education provided:  PWR! sitting (up, rock, step); PWR! supine (basic 4),coordination; PWR! quadraped (up, rock, step)      Patient will benefit from skilled therapeutic intervention in order to improve the following deficits and impairments:  Abnormal gait, Decreased coordination, Decreased range of motion, Difficulty walking, Impaired flexibility, Decreased safety  awareness, Decreased endurance, Decreased activity tolerance, Decreased knowledge of precautions, Impaired tone, Impaired UE functional use, Pain, Decreased knowledge of use of DME, Decreased balance, Decreased mobility, Decreased strength, Impaired perceived functional ability, Impaired vision/preception  Visit Diagnosis: Other symptoms and signs involving the nervous system  Other symptoms and signs involving the musculoskeletal system  Other lack of coordination    Problem List Patient Active Problem List   Diagnosis Date Noted  . PD (Parkinson's disease) ([place]) 12/18/2015  . Tremor of both hands 11/22/2015  . PAD (  peripheral artery disease) s/p remote left external iliac artery angioplasty 10/28/2012  . Coronary atherosclerosis 10/28/2012  . Hyperlipidemia 10/28/2012  . HTN (hypertension) 10/28/2012  . DM2 (diabetes mellitus, type 2) ([place]) 10/28/2012  . Carotid occlusion, right 10/28/2012    Carey Bullocks, OTR/L 03/19/2016, 2:22 PM  [facility] [address] [place], [place], [address] Phone: [phone]   Fax:  [phone]  Name: JOVAN SCIBELLI MRN: [id number] Date of Birth: 11-26-1945

## 2016-03-19 NOTE — Therapy (Signed)
[facility] [address] [place], [place], [address] Phone: [phone]   Fax:  [phone]  Speech Language Pathology Treatment  Patient Details  Name: Rodney Love MRN: [id number] Date of Birth: Sep 06, 1945 Referring Provider: Dr. Wells Guiles Tat  Encounter Date: 03/19/2016      End of Session - 03/19/16 1637    Visit Number 9   Number of Visits 17   Date for SLP Re-Evaluation 03/28/16   SLP Start Time 1532   SLP Stop Time  [id number]   SLP Time Calculation (min) 43 min   Activity Tolerance Patient tolerated treatment well      Past Medical History:  Diagnosis Date  . Arthritis   . Constipation    OCCASIONAL  . Coronary artery disease   . Diabetes mellitus without complication ([place])   . Environmental allergies   . Hyperlipidemia   . Hypertension   . Inguinal hernia   . Nocturia   . Peripheral arterial disease [facility])     Past Surgical History:  Procedure Laterality Date  . BACK SURGERY    . CARDIAC CATHETERIZATION  05/29/2006   single vessel CAD w/moderate stenosis of the prox. RCA approx 40%,prox. LAD 20-30%,minor irreg. mid abdominal aorta,left iliac artery disease 50-60%  . CATARACT EXTRACTION Right   . HERNIA REPAIR  [id number]  . INGUINAL HERNIA REPAIR N/A 09/23/2012   Procedure: LAPAROSCOPIC INGUINAL HERNIA with umbilical hernia;  Surgeon: Madilyn Hook, DO;  [facility];  Service: General;  Laterality: N/A;  . INSERTION OF MESH Bilateral 09/23/2012   Procedure: INSERTION OF MESH;  Surgeon: Madilyn Hook, DO;  [facility];  Service: General;  Laterality: Bilateral;    There were no vitals filed for this visit.      Subjective Assessment - 03/19/16 1541    Subjective "I guess "the loud /a/" is going alright."   Patient is accompained by: Family member  wife   Currently in Pain? No/denies               ADULT SLP TREATMENT - 03/19/16 1543      General Information   Behavior/Cognition  Alert;Cooperative;Pleasant mood     Treatment Provided   Treatment provided Cognitive-Linquistic     Cognitive-Linquistic Treatment   Treatment focused on Dysarthria;Voice   Skilled Treatment Loud /a/ average 91dB - with usual min-mod A for loudness, and usual min-mod cues for "ah" and not "a" (as in "cat"). With sentence tasks pt's loudness ranged from 68-71 with average 70dB with occasional min A for loudness. SLP reviewed anomia compensations.     Assessment / Recommendations / Plan   Plan Continue with current plan of care     Progression Toward Goals   Progression toward goals Progressing toward goals          SLP Education - 03/19/16 1637    Education provided Yes   Education Details loud /a/   Person(s) Educated Patient;Spouse   Methods Explanation;Demonstration;Verbal cues   Comprehension Verbalized understanding;Verbal cues required;Need further instruction          SLP Short Term Goals - 03/19/16 1639      SLP SHORT TERM GOAL #1   Title Pt will average 90dB on loud /a/ with rare min A over 3 sessions   Period Weeks   Status Achieved     SLP SHORT TERM GOAL #2   Title Pt will average 70dB on 85% of  sentence level structured speech tasks with occasional min A.  Status Achieved     SLP SHORT TERM GOAL #3   Title Pt will maintain average of 70dB during 8 minute simple conversation with occasional min A   Status Achieved          SLP Long Term Goals - 03/19/16 1639      SLP LONG TERM GOAL #1   Title Pt will maintain average of 70dB during 15 minute moderately complex conversation with rare min A   Time 4   Period Weeks   Status On-going     SLP LONG TERM GOAL #2   Title Pt. will maintain audible conversation over 12 minutes in noisy environment outside of therapy room.   Time 4   Period Weeks   Status On-going          Plan - 03/19/16 1549    Clinical Impression Statement Pt continues to required visual and verbal cues to carryover loud  volume, especially when cognitive linguistic load increases. Word finding impairment also affects carryover of loud volume. Pt and wife were reminded of compensations for word finding diffiuclty. Continue skilled ST to maximize carryover of volume and minimize anomia.   Speech Therapy Frequency 2x / week   Treatment/Interventions Compensatory strategies;Patient/family education;Functional tasks;Environmental controls;SLP instruction and feedback   Potential to Achieve Goals Good      Patient will benefit from skilled therapeutic intervention in order to improve the following deficits and impairments:   Dysarthria and anarthria    Problem List Patient Active Problem List   Diagnosis Date Noted  . PD (Parkinson's disease) ([place]) 12/18/2015  . Tremor of both hands 11/22/2015  . PAD (peripheral artery disease) s/p remote left external iliac artery angioplasty 10/28/2012  . Coronary atherosclerosis 10/28/2012  . Hyperlipidemia 10/28/2012  . HTN (hypertension) 10/28/2012  . DM2 (diabetes mellitus, type 2) ([place]) 10/28/2012  . Carotid occlusion, right 10/28/2012    [facility] ,MS, CCC-SLP  03/19/2016, 4:40 PM  [facility] [address] [place], [place], [address] Phone: [phone]   Fax:  [phone]   Name: Rodney Love MRN: [id number] Date of Birth: [birth year]

## 2016-03-19 NOTE — Patient Instructions (Addendum)
  Please complete the assigned speech therapy homework and return it to your next session.   LOUD "AH" -comfortable big breath -mouth wide -hold it out until you need to take another breath .

## 2016-03-19 NOTE — Therapy (Signed)
[facility] [address] [place], [place], [address] Phone: [phone]   Fax:  [phone]  Physical Therapy Treatment  Patient Details  Name: Rodney Love MRN: [id number] Date of Birth: 1945/06/29 Referring Provider: Wells Guiles Tat  Encounter Date: 03/19/2016      PT End of Session - 03/19/16 1434    Visit Number 9   Number of Visits 17   Date for PT Re-Evaluation 03/30/16   Authorization Type UHC Medicare-GCODE every 10th visit   PT Start Time 1400   PT Stop Time 1440   PT Time Calculation (min) 40 min   Equipment Utilized During Treatment Gait belt   Activity Tolerance Patient tolerated treatment well   Behavior During Therapy [facility] for tasks assessed/performed      Past Medical History:  Diagnosis Date  . Arthritis   . Constipation    OCCASIONAL  . Coronary artery disease   . Diabetes mellitus without complication ([place])   . Environmental allergies   . Hyperlipidemia   . Hypertension   . Inguinal hernia   . Nocturia   . Peripheral arterial disease [facility])     Past Surgical History:  Procedure Laterality Date  . BACK SURGERY    . CARDIAC CATHETERIZATION  05/29/2006   single vessel CAD w/moderate stenosis of the prox. RCA approx 40%,prox. LAD 20-30%,minor irreg. mid abdominal aorta,left iliac artery disease 50-60%  . CATARACT EXTRACTION Right   . HERNIA REPAIR  [id number]  . INGUINAL HERNIA REPAIR N/A 09/23/2012   Procedure: LAPAROSCOPIC INGUINAL HERNIA with umbilical hernia;  Surgeon: Madilyn Hook, DO;  [facility];  Service: General;  Laterality: N/A;  . INSERTION OF MESH Bilateral 09/23/2012   Procedure: INSERTION OF MESH;  Surgeon: Madilyn Hook, DO;  [facility]: [facility];  Service: General;  Laterality: Bilateral;    There were no vitals filed for this visit.      Subjective Assessment - 03/19/16 1400    Subjective nothing new to report; no falls   Patient Stated Goals Pt's goals for therapy is  to improve walking and balance.   Currently in Pain? No/denies                         [facility] Adult PT Treatment/Exercise - 03/19/16 1416      Ambulation/Gait   Ambulation/Gait Yes   Ambulation/Gait Assistance 5: Supervision   Ambulation Distance (Feet) 900 Feet   Assistive device None   Gait Pattern Step-through pattern;Decreased arm swing - right;Decreased arm swing - left;Decreased step length - left;Narrow base of support   Ambulation Surface Level;Unlevel;Indoor;Outdoor;Grass;Other (comment)  mulch   Gait Comments included cognitive tasks with amb with slightly decreased speed     Knee/Hip Exercises: Aerobic   Other Aerobic Scifit level 3.5 all 4 extremities x 8 minutes with rpm>75 with cues to maintain intensity     Knee/Hip Exercises: Standing   Hip Flexion Both;20 reps;Knee bent   Hip Flexion Limitations 3#   Hip Abduction Both;2 sets;10 reps;Knee straight   Abduction Limitations 3#   Hip Extension Both;2 sets;10 reps;Knee straight   Extension Limitations 3#     Knee/Hip Exercises: Seated   Sit to Sand 20 reps;without UE support  on compliant surface             Balance Exercises - 03/19/16 1404      Balance Exercises: Standing   Step Ups Forward;Lateral;6 inch;Intermittent UE support  x10 each   Balance Beam  red beam: horizontal/vertical head turns             PT Short Term Goals - 03/13/16 1159      PT SHORT TERM GOAL #1   Title Pt will be independent with HEP for improved posture, transfers, balance and gait.  TARGET 02/19/16   Time 4   Period Weeks   Status Partially Met     PT SHORT TERM GOAL #2   Title Pt will improve 5x sit<>stand transfers to less than or equal to 15 seconds for improved efficiency and safety of transfers.   Time 4   Period Weeks   Status Achieved     PT SHORT TERM GOAL #3   Title Pt will improve TUG score to less than or equal to 13.5 seconds for decreased fall risk.   Time 4   Period Weeks    Status Achieved     PT SHORT TERM GOAL #4   Title Pt will improve SLS to at least 3 seconds each leg, for improved obstacle and step negotiation.   Time 4   Period Weeks   Status Partially Met     PT SHORT TERM GOAL #5   Title Pt will verbalize understanding of local Parkinson's disease resources.   Time 4   Period Weeks   Status New           PT Long Term Goals - 01/30/16 1419      PT LONG TERM GOAL #1   Title Pt will verbalize understanding of fall prevention in home environment.  TARGET 03/30/16   Time 8   Period Weeks   Status New     PT LONG TERM GOAL #2   Title Pt will improve 5x sit<>stand to less than or equal to 11.5 seconds for improved transfer efficiency and safety.   Time 8   Period Weeks   Status New     PT LONG TERM GOAL #3   Title Pt will improve TUG cognitive score to less than or equal to 15 seconds for decreased fall risk.   Time 8   Period Weeks   Status New     PT LONG TERM GOAL #4   Title Pt will improve MiniBESTest to at least 24/28 for decreased fall risk.   Time 8   Period Weeks   Status New     PT LONG TERM GOAL #5   Title Pt will verbalize plans for continued community fitness upon D/C from PT.   Time 8   Period Weeks   Status New               Plan - 03/19/16 1434    Clinical Impression Statement Pt tolerated gait and balance training on compliant surface well with minguard A to supervision needed.  Progressing well towards goals.   PT Treatment/Interventions ADLs/Self Care Home Management;Functional mobility training;Gait training;Therapeutic activities;Therapeutic exercise;Balance training;Neuromuscular re-education;Patient/family education   PT Next Visit Plan Continue gait and balance activities; varied direction stepping, changes of direction. Gcode    Consulted and Agree with Plan of Care Patient      Patient will benefit from skilled therapeutic intervention in order to improve the following deficits and  impairments:  Abnormal gait, Decreased balance, Decreased mobility, Decreased strength, Difficulty walking, Impaired flexibility, Postural dysfunction  Visit Diagnosis: Other abnormalities of gait and mobility  Unsteadiness on feet     Problem List Patient Active Problem List   Diagnosis Date Noted  . PD (Parkinson's disease) (  [place]) 12/18/2015  . Tremor of both hands 11/22/2015  . PAD (peripheral artery disease) s/p remote left external iliac artery angioplasty 10/28/2012  . Coronary atherosclerosis 10/28/2012  . Hyperlipidemia 10/28/2012  . HTN (hypertension) 10/28/2012  . DM2 (diabetes mellitus, type 2) ([place]) 10/28/2012  . Carotid occlusion, right 10/28/2012    Siri Cole 03/19/2016, 2:41 PM  [facility] [address] [place], [place], [address] Phone: [phone]   Fax:  [phone]  Name: Rodney Love MRN: [id number] Date of Birth: 05-Dec-1945

## 2016-03-20 DIAGNOSIS — L853 Xerosis cutis: Secondary | ICD-10-CM | POA: Diagnosis not present

## 2016-03-20 DIAGNOSIS — Z0001 Encounter for general adult medical examination with abnormal findings: Secondary | ICD-10-CM | POA: Diagnosis not present

## 2016-03-20 DIAGNOSIS — K5710 Diverticulosis of small intestine without perforation or abscess without bleeding: Secondary | ICD-10-CM | POA: Diagnosis not present

## 2016-03-20 DIAGNOSIS — I2510 Atherosclerotic heart disease of native coronary artery without angina pectoris: Secondary | ICD-10-CM | POA: Diagnosis not present

## 2016-03-21 ENCOUNTER — Encounter: Payer: Self-pay | Admitting: Physical Therapy

## 2016-03-21 ENCOUNTER — Ambulatory Visit: Payer: Medicare Other | Admitting: Occupational Therapy

## 2016-03-21 ENCOUNTER — Ambulatory Visit: Payer: Medicare Other | Admitting: Physical Therapy

## 2016-03-21 ENCOUNTER — Ambulatory Visit: Payer: Medicare Other

## 2016-03-21 DIAGNOSIS — M25612 Stiffness of left shoulder, not elsewhere classified: Secondary | ICD-10-CM

## 2016-03-21 DIAGNOSIS — R278 Other lack of coordination: Secondary | ICD-10-CM

## 2016-03-21 DIAGNOSIS — R2689 Other abnormalities of gait and mobility: Secondary | ICD-10-CM

## 2016-03-21 DIAGNOSIS — R29818 Other symptoms and signs involving the nervous system: Secondary | ICD-10-CM

## 2016-03-21 DIAGNOSIS — R471 Dysarthria and anarthria: Secondary | ICD-10-CM | POA: Diagnosis not present

## 2016-03-21 DIAGNOSIS — R29898 Other symptoms and signs involving the musculoskeletal system: Secondary | ICD-10-CM | POA: Diagnosis not present

## 2016-03-21 DIAGNOSIS — R2681 Unsteadiness on feet: Secondary | ICD-10-CM

## 2016-03-21 DIAGNOSIS — R293 Abnormal posture: Secondary | ICD-10-CM | POA: Diagnosis not present

## 2016-03-21 NOTE — Therapy (Addendum)
[facility] [address] [place], [place], [address] Phone: [phone]   Fax:  [phone]  Physical Therapy Treatment  Patient Details  Name: Rodney Love MRN: [id number] Date of Birth: 1946/01/22 Referring Provider: Wells Guiles Tat  Encounter Date: 03/21/2016      PT End of Session - 03/21/16 1403    Visit Number 10   Number of Visits 17   Date for PT Re-Evaluation 03/30/16   Authorization Type UHC Medicare-GCODE every 10th visit   PT Start Time 1315   PT Stop Time 1400   PT Time Calculation (min) 45 min   Activity Tolerance Patient tolerated treatment well      Past Medical History:  Diagnosis Date  . Arthritis   . Constipation    OCCASIONAL  . Coronary artery disease   . Diabetes mellitus without complication ([place])   . Environmental allergies   . Hyperlipidemia   . Hypertension   . Inguinal hernia   . Nocturia   . Peripheral arterial disease [facility])     Past Surgical History:  Procedure Laterality Date  . BACK SURGERY    . CARDIAC CATHETERIZATION  05/29/2006   single vessel CAD w/moderate stenosis of the prox. RCA approx 40%,prox. LAD 20-30%,minor irreg. mid abdominal aorta,left iliac artery disease 50-60%  . CATARACT EXTRACTION Right   . HERNIA REPAIR  [id number]  . INGUINAL HERNIA REPAIR N/A 09/23/2012   Procedure: LAPAROSCOPIC INGUINAL HERNIA with umbilical hernia;  Surgeon: Madilyn Hook, DO;  Location: [facility];  Service: General;  Laterality: N/A;  . INSERTION OF MESH Bilateral 09/23/2012   Procedure: INSERTION OF MESH;  Surgeon: Madilyn Hook, DO;  Location: [facility];  Service: General;  Laterality: Bilateral;    There were no vitals filed for this visit.      Subjective Assessment - 03/21/16 1316    Subjective Pt works on PT HEP at home.   Currently in Pain? No/denies            [facility] PT Assessment - 03/21/16 0001      Timed Up and Go Test   TUG Normal TUG;Cognitive TUG   Normal TUG  (seconds) 8.25   Cognitive TUG (seconds) 8.25         Min-iBest Test: scored 26/28. Lost points on Rise to toes and Stand on one leg.            [facility] Adult PT Treatment/Exercise - 03/21/16 0001      Transfers   Sit to Stand 6: Modified independent (Device/Increase time);Without upper extremity assist;From chair/3-in-1;From elevated surface   Five time sit to stand comments  9.88             Balance Exercises - 03/21/16 1359      Balance Exercises: Standing   Standing Eyes Opened Wide (BOA);Foam/compliant surface  Multi directional tapping cones (supervision) + multidirect           PT Education - 03/21/16 1400    Education provided Yes   Education Details Discussed results of test performed. Encouraged pt and family to start looking into continuing fitness options.   Person(s) Educated Patient;Child(ren)   Methods Explanation   Comprehension Verbalized understanding          PT Short Term Goals - 03/13/16 1159      PT SHORT TERM GOAL #1   Title Pt will be independent with HEP for improved posture, transfers, balance and gait.  TARGET 02/19/16   Time 4  Period Weeks   Status Partially Met     PT SHORT TERM GOAL #2   Title Pt will improve 5x sit<>stand transfers to less than or equal to 15 seconds for improved efficiency and safety of transfers.   Time 4   Period Weeks   Status Achieved     PT SHORT TERM GOAL #3   Title Pt will improve TUG score to less than or equal to 13.5 seconds for decreased fall risk.   Time 4   Period Weeks   Status Achieved     PT SHORT TERM GOAL #4   Title Pt will improve SLS to at least 3 seconds each leg, for improved obstacle and step negotiation.   Time 4   Period Weeks   Status Partially Met     PT SHORT TERM GOAL #5   Title Pt will verbalize understanding of local Parkinson's disease resources.   Time 4   Period Weeks   Status New           PT Long Term Goals - 01/30/16 1419      PT LONG TERM  GOAL #1   Title Pt will verbalize understanding of fall prevention in home environment.  TARGET 03/30/16   Time 8   Period Weeks   Status New     PT LONG TERM GOAL #2   Title Pt will improve 5x sit<>stand to less than or equal to 11.5 seconds for improved transfer efficiency and safety.   Time 8   Period Weeks   Status New     PT LONG TERM GOAL #3   Title Pt will improve TUG cognitive score to less than or equal to 15 seconds for decreased fall risk.   Time 8   Period Weeks   Status New     PT LONG TERM GOAL #4   Title Pt will improve MiniBESTest to at least 24/28 for decreased fall risk.   Time 8   Period Weeks   Status New     PT LONG TERM GOAL #5   Title Pt will verbalize plans for continued community fitness upon D/C from PT.   Time 8   Period Weeks   Status New               Plan - 18-Apr-2016 1601    Clinical Impression Statement Pt had great scores for G-code tests today demonstrating low fall risk with functional balance and good gains with functional LE strength. Recommned pt and daughter start to look into continuing fitness options.   PT Treatment/Interventions ADLs/Self Care Home Management;Functional mobility training;Gait training;Therapeutic activities;Therapeutic exercise;Balance training;Neuromuscular re-education;Patient/family education   PT Next Visit Plan Continue gait and balance activities; varied direction stepping, changes of direction.    Consulted and Agree with Plan of Care Patient      Patient will benefit from skilled therapeutic intervention in order to improve the following deficits and impairments:  Abnormal gait, Decreased balance, Decreased mobility, Decreased strength, Difficulty walking, Impaired flexibility, Postural dysfunction  Visit Diagnosis: Other symptoms and signs involving the nervous system  Other abnormalities of gait and mobility  Unsteadiness on feet       G-Codes - 04-18-16 1403    Functional Assessment Tool  Used 5x sit<>stand 9.88 sec, TUG 8.25 sec, TUG cog 8.25 sec, MiniBESTest 26/28   Functional Limitation Mobility: Walking and moving around   Mobility: Walking and Moving Around Current Status ([id number]) At least 1 percent but less than 20 percent impaired,  limited or restricted   Mobility: Walking and Moving Around Goal Status [phone]) At least 1 percent but less than 20 percent impaired, limited or restricted     Gcode entered by:  Cameron Sprang, PT, MPT [facility] [address] [place], [place], [address] Phone: [phone]   Fax:  [phone] 03/21/16, 8:14 PM    Problem List Patient Active Problem List   Diagnosis Date Noted  . PD (Parkinson's disease) ([place]) 12/18/2015  . Tremor of both hands 11/22/2015  . PAD (peripheral artery disease) s/p remote left external iliac artery angioplasty 10/28/2012  . Coronary atherosclerosis 10/28/2012  . Hyperlipidemia 10/28/2012  . HTN (hypertension) 10/28/2012  . DM2 (diabetes mellitus, type 2) ([place]) 10/28/2012  . Carotid occlusion, right 10/28/2012   Bjorn Loser, PTA  03/21/16, 8:14 PM [facility] [address] [place], [place], [address] Phone: [phone]   Fax:  [phone]  Name: CLAUDIE RATHBONE MRN: [id number] Date of Birth: 25-Feb-1946  Physical Therapy Progress Note  Dates of Reporting Period: 01/30/16 to 03/21/16  Objective Reports of Subjective Statement: Improved overall functional mobility; pt reports he is feeling much better, less stiff  Objective Measurements: MiniBESTest score 26/28, TUG 8.25 sec, 5x sit<>stand 9.88 sec  Goal Update: Pt has met LTG 2,3,4.  See STG assessment above.  Plan: See patient for 1-2 additional visits to address fall prevention, transition to community fitness.  Reason Skilled Services are Required: Pt has made excellent functional progress, and patient will benefit from instruction in  fall prevention and transition to optimal community fitness program.  Mady Haagensen, PT 03/28/16 9:27 AM Phone: [phone] Fax: [phone]

## 2016-03-21 NOTE — Therapy (Signed)
[facility] [address] [place], [place], [address] Phone: [phone]   Fax:  [phone]  Occupational Therapy Treatment  Patient Details  Name: Rodney Love MRN: [id number] Date of Birth: [birth year] Referring Provider: Dr. Carles Collet  Encounter Date: 03/21/2016      OT End of Session - 03/21/16 1404    Visit Number 13   Number of Visits 17   Date for OT Re-Evaluation 03/29/16   Authorization Type UHC Medicare   Authorization - Visit Number 13   Authorization - Number of Visits 20   OT Start Time [id number]   OT Stop Time 1445   OT Time Calculation (min) 42 min   Activity Tolerance Patient tolerated treatment well   Behavior During Therapy [facility] for tasks assessed/performed      Past Medical History:  Diagnosis Date  . Arthritis   . Constipation    OCCASIONAL  . Coronary artery disease   . Diabetes mellitus without complication ([place])   . Environmental allergies   . Hyperlipidemia   . Hypertension   . Inguinal hernia   . Nocturia   . Peripheral arterial disease [facility])     Past Surgical History:  Procedure Laterality Date  . BACK SURGERY    . CARDIAC CATHETERIZATION  05/29/2006   single vessel CAD w/moderate stenosis of the prox. RCA approx 40%,prox. LAD 20-30%,minor irreg. mid abdominal aorta,left iliac artery disease 50-60%  . CATARACT EXTRACTION Right   . HERNIA REPAIR  [id number]  . INGUINAL HERNIA REPAIR N/A 09/23/2012   Procedure: LAPAROSCOPIC INGUINAL HERNIA with umbilical hernia;  Surgeon: Madilyn Hook, DO;  [facility];  Service: General;  Laterality: N/A;  . INSERTION OF MESH Bilateral 09/23/2012   Procedure: INSERTION OF MESH;  Surgeon: Madilyn Hook, DO;  [facility];  Service: General;  Laterality: Bilateral;    There were no vitals filed for this visit.      Subjective Assessment - 03/21/16 1448    Subjective  The pain's been gone.  Hopefully it stays gone.   Patient is accompained by:  Family member   Pertinent History see Epic, hx of back surgery   Patient Stated Goals improve use of LUE   Currently in Pain? No/denies       PWR! Hands (up) x15 with min v.c. Intermittently for incr movement amplitude.  Practiced buttoning/unbuttoning shirt on table and then while wearing with cueing for use of PWR! Hands/large amplitude.   Pt with significantly less difficulty when wearing shirt.   Tossing/catching scarves with therapist (first with 1 scarf and each hand individually, then alternating UEs in sequence with 2 scarves) with mod cueing for wt. Shift and large amplitude movements and full finger extension.  Then, tossing/catching scarf from one hand to the other with min cueing for large amplitude.    Pulling scarf into palm with min cueing for large amplitude finger extension (each hand).  Trunk rotations and wt. Shifts with UE using boom whackers in sequence for large amplitude UE movements with min cueing and min cueing for keeping head up.  Pt demo decr amplitude with "hits" with LUE.  Dealing cards with each hand with min-mod difficulty with RUE and min difficulty with LUE and min cueing for large amplitude movements.   Big walking with min cueing for arm swing.                     OT Short Term Goals - 03/15/16 1321  OT SHORT TERM GOAL #1   Title I with PD specific HEP. due 02/29/16   Time 4   Period Weeks   Status Achieved     OT SHORT TERM GOAL #2   Title Pt will verbalize understanding of adapted strateiges for ADLs/IADLs.   Time 4   Period Weeks   Status Achieved     OT SHORT TERM GOAL #3   Title Pt will demonstrate ability to retrieve a lightweight object at 115 shoulder flexion , and -10 elbow extension with LUE with pain less than or equal to 2/10.   Time 4   Period Weeks   Status Achieved     OT SHORT TERM GOAL #4   Title Pt will demonstrate improved fine motor coordination as evidenced by decreasing LUE 9 hole peg test  score to 36 secs or less.   Baseline RUE: 28.47 secs, LUE 41.19 secs   Time 8   Period Weeks   Status Achieved  37.09, 28.50- approximating average meets goal           OT Long Term Goals - 03/21/16 1445      OT LONG TERM GOAL #1   Title Pt will verbalize understanding of ways to keep thinking skills sharp, memory compensations PRN..   Time 8   Period Weeks   Status Achieved     OT LONG TERM GOAL #2   Title Pt will verbalize understanding of community resources and ways to prevent future PD- related complications.   Time 8   Period Weeks   Status On-going     OT LONG TERM GOAL #3   Title Pt will decrease 3 button/ unbutton score to 40 secs or less.   Status Achieved  69.13 secs;  03/21/16:  21.13sec while wearing shirt (incr difficulty on tabletop)     OT LONG TERM GOAL #4   Title Pt will  demonstrate improved ease with dressing as evidenced by performing PPT# 4(don/doff jacket) in 24 secs or less   Time 8   Period Weeks   Status Achieved  18.47 secs               Plan - 03/21/16 1404    Clinical Impression Statement Pt demo improved movement amplitude with functional activities, but needs min cueing intermittently.   Rehab Potential Good   OT Frequency 2x / week   OT Duration 8 weeks   OT Treatment/Interventions Self-care/ADL training;Moist Heat;Fluidtherapy;DME and/or AE instruction;Patient/family education;Balance training;Therapeutic exercises;Ultrasound;Therapeutic exercise;Therapeutic activities;Passive range of motion;Cognitive remediation/compensation;Functional Mobility Training;Neuromuscular education;Parrafin;Energy conservation;Manual Therapy;Visual/perceptual remediation/compensation   Plan issue ex chart for incr carryover at home, ?d/c next week   OT Home Exercise Plan Education provided:  PWR! sitting (up, rock, step); PWR! supine (basic 4),coordination; PWR! quadraped (up, rock, step)   Consulted and Agree with Plan of Care Patient;Family  member/caregiver      Patient will benefit from skilled therapeutic intervention in order to improve the following deficits and impairments:  Abnormal gait, Decreased coordination, Decreased range of motion, Difficulty walking, Impaired flexibility, Decreased safety awareness, Decreased endurance, Decreased activity tolerance, Decreased knowledge of precautions, Impaired tone, Impaired UE functional use, Pain, Decreased knowledge of use of DME, Decreased balance, Decreased mobility, Decreased strength, Impaired perceived functional ability, Impaired vision/preception  Visit Diagnosis: Other symptoms and signs involving the nervous system  Other symptoms and signs involving the musculoskeletal system  Other lack of coordination  Abnormal posture  Other abnormalities of gait and mobility  Stiffness of joint, shoulder region,  left    Problem List Patient Active Problem List   Diagnosis Date Noted  . PD (Parkinson's disease) ([place]) 12/18/2015  . Tremor of both hands 11/22/2015  . PAD (peripheral artery disease) s/p remote left external iliac artery angioplasty 10/28/2012  . Coronary atherosclerosis 10/28/2012  . Hyperlipidemia 10/28/2012  . HTN (hypertension) 10/28/2012  . DM2 (diabetes mellitus, type 2) ([place]) 10/28/2012  . Carotid occlusion, right 10/28/2012    [facility] 03/21/2016, 4:38 PM  [facility] [address] [place], [place], [address] Phone: [phone]   Fax:  [phone]  Name: Rodney Love MRN: [id number] Date of Birth: 1945/08/26   Vianne Bulls, OTR/L [facility] [address] [place], [place]  [address] [phone] phone [phone] 03/21/16 4:38 PM

## 2016-03-21 NOTE — Patient Instructions (Signed)
  Please complete the assigned speech therapy homework and return it to your next session.  

## 2016-03-21 NOTE — Therapy (Signed)
[facility] [address] [place], [place], [address] Phone: [phone]   Fax:  [phone]  Speech Language Pathology Treatment  Patient Details  Name: Rodney Love MRN: [id number] Date of Birth: 1945-10-22 Referring Provider: Dr. Wells Guiles Tat  Encounter Date: 03/21/2016      End of Session - 03/21/16 1533    Visit Number 10   Number of Visits 17   Date for SLP Re-Evaluation 04/05/16   SLP Start Time [id number]   SLP Stop Time  1531   SLP Time Calculation (min) 42 min   Activity Tolerance Patient tolerated treatment well      Past Medical History:  Diagnosis Date  . Arthritis   . Constipation    OCCASIONAL  . Coronary artery disease   . Diabetes mellitus without complication ([place])   . Environmental allergies   . Hyperlipidemia   . Hypertension   . Inguinal hernia   . Nocturia   . Peripheral arterial disease [facility])     Past Surgical History:  Procedure Laterality Date  . BACK SURGERY    . CARDIAC CATHETERIZATION  05/29/2006   single vessel CAD w/moderate stenosis of the prox. RCA approx 40%,prox. LAD 20-30%,minor irreg. mid abdominal aorta,left iliac artery disease 50-60%  . CATARACT EXTRACTION Right   . HERNIA REPAIR  [id number]  . INGUINAL HERNIA REPAIR N/A 09/23/2012   Procedure: LAPAROSCOPIC INGUINAL HERNIA with umbilical hernia;  Surgeon: Madilyn Hook, DO;  [facility];  Service: General;  Laterality: N/A;  . INSERTION OF MESH Bilateral 09/23/2012   Procedure: INSERTION OF MESH;  Surgeon: Madilyn Hook, DO;  [facility];  Service: General;  Laterality: Bilateral;    There were no vitals filed for this visit.      Subjective Assessment - 03/21/16 1457    Subjective "When my wife lets me (I do the loud /a/)."   Patient is accompained by: Family member  daughter   Currently in Pain? No/denies               ADULT SLP TREATMENT - 03/21/16 1458      General Information   Behavior/Cognition Alert;Cooperative;Pleasant mood     Treatment Provided   Treatment provided Cognitive-Linquistic     Cognitive-Linquistic Treatment   Treatment focused on Dysarthria;Voice   Skilled Treatment SLP facilitated pt's WNL speech loudenss with loud /a/ producing average of 92dB with usual min A for loudness. In simple word tasks, pt maintained WNL volume 95% of the time wiht SLP min-mod A occasionally for loudness. In phrase/sentence tasks pt remained with WNL loudness 90% of the time, with min A x2 (<5%) for louder voice. In simple questions between stimuli pt responded with WNL volume 90% of the time, improved to 100% with nonverbal cues by SLP.      Assessment / Recommendations / Plan   Plan Continue with current plan of care     Progression Toward Goals   Progression toward goals Progressing toward goals            SLP Short Term Goals - 03/19/16 1639      SLP SHORT TERM GOAL #1   Title Pt will average 90dB on loud /a/ with rare min A over 3 sessions   Period Weeks   Status Achieved     SLP SHORT TERM GOAL #2   Title Pt will average 70dB on 85% of  sentence level structured speech tasks with occasional min A.   Status Achieved  SLP SHORT TERM GOAL #3   Title Pt will maintain average of 70dB during 8 minute simple conversation with occasional min A   Status Achieved          SLP Long Term Goals - 22-Mar-2016 1535      SLP LONG TERM GOAL #1   Title Pt will maintain average of 70dB during 15 minute moderately complex conversation with rare min A   Time 4   Period Weeks   Status On-going     SLP LONG TERM GOAL #2   Title Pt. will maintain audible conversation over 12 minutes in noisy environment outside of therapy room.   Time 4   Period Weeks   Status On-going          Plan - 22-Mar-2016 1533    Clinical Impression Statement SLP cues to carryover loud volume in phrase/sentences is required less of the time and pt is now using SNL loudness between  stimuli. Continue skilled ST to maximize carryover of volume and minimize anomia.   Speech Therapy Frequency 2x / week   Duration 4 weeks   Treatment/Interventions Compensatory strategies;Patient/family education;Functional tasks;Environmental controls;SLP instruction and feedback   Potential to Achieve Goals Good      Patient will benefit from skilled therapeutic intervention in order to improve the following deficits and impairments:   Dysarthria and anarthria      G-Codes - 03-22-16 1536    Functional Assessment Tool Used NOMS   Functional Limitations Motor speech   Motor Speech Current Status [phone]) At least 20 percent but less than 40 percent impaired, limited or restricted   Motor Speech Goal Status [id number]) At least 1 percent but less than 20 percent impaired, limited or restricted      Problem List Patient Active Problem List   Diagnosis Date Noted  . PD (Parkinson's disease) ([place]) 12/18/2015  . Tremor of both hands 11/22/2015  . PAD (peripheral artery disease) s/p remote left external iliac artery angioplasty 10/28/2012  . Coronary atherosclerosis 10/28/2012  . Hyperlipidemia 10/28/2012  . HTN (hypertension) 10/28/2012  . DM2 (diabetes mellitus, type 2) ([place]) 10/28/2012  . Carotid occlusion, right 10/28/2012    [facility] ,[place], CCC-SLP  03-22-16, 3:36 PM  [facility] [address] [place], [place], [address] Phone: [phone]   Fax:  [phone]   Name: Rodney Love MRN: [id number] Date of Birth: [birth year]

## 2016-03-26 ENCOUNTER — Ambulatory Visit: Payer: Medicare Other | Admitting: Speech Pathology

## 2016-03-26 ENCOUNTER — Ambulatory Visit: Payer: Medicare Other | Admitting: Occupational Therapy

## 2016-03-26 DIAGNOSIS — R471 Dysarthria and anarthria: Secondary | ICD-10-CM

## 2016-03-26 DIAGNOSIS — R29898 Other symptoms and signs involving the musculoskeletal system: Secondary | ICD-10-CM | POA: Diagnosis not present

## 2016-03-26 DIAGNOSIS — R2689 Other abnormalities of gait and mobility: Secondary | ICD-10-CM | POA: Diagnosis not present

## 2016-03-26 DIAGNOSIS — R293 Abnormal posture: Secondary | ICD-10-CM | POA: Diagnosis not present

## 2016-03-26 DIAGNOSIS — R278 Other lack of coordination: Secondary | ICD-10-CM

## 2016-03-26 DIAGNOSIS — R29818 Other symptoms and signs involving the nervous system: Secondary | ICD-10-CM | POA: Diagnosis not present

## 2016-03-26 DIAGNOSIS — R2681 Unsteadiness on feet: Secondary | ICD-10-CM | POA: Diagnosis not present

## 2016-03-26 DIAGNOSIS — M25612 Stiffness of left shoulder, not elsewhere classified: Secondary | ICD-10-CM

## 2016-03-26 NOTE — Therapy (Signed)
[facility] [address] [place], [place], [address] Phone: [phone]   Fax:  [phone]  Occupational Therapy Treatment  Patient Details  Name: Rodney Love MRN: [id number] Date of Birth: Dec 12, 1945 Referring Provider: Dr. Carles Collet  Encounter Date: 03/26/2016      OT End of Session - 03/26/16 1514    Visit Number 14   Number of Visits 17   Date for OT Re-Evaluation 03/29/16   Authorization Type [employer]   Authorization - Visit Number 14   Authorization - Number of Visits 20   OT Start Time [id number]   OT Stop Time 1448   OT Time Calculation (min) 43 min   Activity Tolerance Patient tolerated treatment well   Behavior During Therapy [facility] for tasks assessed/performed      Past Medical History:  Diagnosis Date  . Arthritis   . Constipation    OCCASIONAL  . Coronary artery disease   . Diabetes mellitus without complication ([place])   . Environmental allergies   . Hyperlipidemia   . Hypertension   . Inguinal hernia   . Nocturia   . Peripheral arterial disease [name])     Past Surgical History:  Procedure Laterality Date  . BACK SURGERY    . CARDIAC CATHETERIZATION  05/29/2006   single vessel CAD w/moderate stenosis of the prox. RCA approx 40%,prox. LAD 20-30%,minor irreg. mid abdominal aorta,left iliac artery disease 50-60%  . CATARACT EXTRACTION Right   . HERNIA REPAIR  [id number]  . INGUINAL HERNIA REPAIR N/A 09/23/2012   Procedure: LAPAROSCOPIC INGUINAL HERNIA with umbilical hernia;  Surgeon: Madilyn Hook, DO;  Location: [facility];  Service: General;  Laterality: N/A;  . INSERTION OF MESH Bilateral 09/23/2012   Procedure: INSERTION OF MESH;  Surgeon: Madilyn Hook, DO;  Location: [facility];  Service: General;  Laterality: Bilateral;    There were no vitals filed for this visit.                       PWR [facility]) - 03/26/16 1514    PWR! exercises Moves in supine   PWR! Up x10   PWR! Rock [employer]! Twist x20   Comments supine, mat on floor, min v.c./ demonstration       Reviewed donning/ doffing jacket with adapted strategy(jacket facing away don like cape), min-mod v.c. And repetition required for improved performance. Discussed plans for d/c, pt and dtr agree with d/c next visit. Arm bike x 6 mins level 1, for conditioning.       OT Education - 03/26/16 1512    Education provided Yes   Education Details exercise flowsheet, PWR! supine(up, rock and twist) safety for getting up/ down from floor holding on to furniture, donning/ doffing jacket, community resources   [employer]) Educated Patient;Child(ren)   Methods Explanation;Demonstration;Tactile cues;Verbal cues;Handout   Comprehension Verbalized understanding;Returned demonstration;Verbal cues required          OT Short Term Goals - 03/15/16 1321      OT SHORT TERM GOAL #1   Title I with PD specific HEP. due 02/29/16   Time 4   Period Weeks   Status Achieved     OT SHORT TERM GOAL #2   Title Pt will verbalize understanding of adapted strateiges for ADLs/IADLs.   Time 4   Period Weeks   Status Achieved     OT SHORT TERM GOAL #3   Title Pt will demonstrate ability to retrieve a lightweight object  at 115 shoulder flexion , and -10 elbow extension with LUE with pain less than or equal to 2/10.   Time 4   Period Weeks   Status Achieved     OT SHORT TERM GOAL #4   Title Pt will demonstrate improved fine motor coordination as evidenced by decreasing LUE 9 hole peg test score to 36 secs or less.   Baseline RUE: 28.47 secs, LUE 41.19 secs   Time 8   Period Weeks   Status Achieved  37.09, 28.50- approximating average meets goal           OT Long Term Goals - 03/26/16 1408      OT LONG TERM GOAL #1   Title Pt will verbalize understanding of ways to keep thinking skills sharp, memory compensations PRN..   Time 8   Period Weeks   Status Achieved     OT LONG TERM GOAL #2   Title Pt will verbalize  understanding of community resources and ways to prevent future PD- related complications.   Time 8   Period Weeks   Status On-going     OT LONG TERM GOAL #3   Title Pt will decrease 3 button/ unbutton score to 40 secs or less.   Status Achieved  69.13 secs;  03/21/16:  21.13sec while wearing shirt (incr difficulty on tabletop)     OT LONG TERM GOAL #4   Title Pt will  demonstrate improved ease with dressing as evidenced by performing PPT# 4(don/doff jacket) in 24 secs or less   Time 8   Period Weeks   Status Achieved  18.47 secs               Plan - 03/26/16 1511    Clinical Impression Statement Pt demonstrates excellent overall progress. Pt and dtr agree with plans for d/c next visit and pt/ dtr would like to schedule a PD eval in 6 mons.   Rehab Potential Good   OT Frequency 2x / week   OT Treatment/Interventions Self-care/ADL training;Moist Heat;Fluidtherapy;DME and/or AE instruction;Patient/family education;Balance training;Therapeutic exercises;Ultrasound;Therapeutic exercise;Therapeutic activities;Passive range of motion;Cognitive remediation/compensation;Functional Mobility Training;Neuromuscular education;Parrafin;Energy conservation;Manual Therapy;Visual/perceptual remediation/compensation   Plan reinforce community resources/ ways to prevent future complications, d/c OT, check goal, G code, schedule eval in 6 mons   OT Home Exercise Plan Education provided:  PWR! sitting (up, rock, step); PWR! supine (basic 4),coordination; PWR! quadraped (up, rock, step)   Consulted and Agree with Plan of Care Patient      Patient will benefit from skilled therapeutic intervention in order to improve the following deficits and impairments:  Abnormal gait, Decreased coordination, Decreased range of motion, Difficulty walking, Impaired flexibility, Decreased safety awareness, Decreased endurance, Decreased activity tolerance, Decreased knowledge of precautions, Impaired tone, Impaired  UE functional use, Pain, Decreased knowledge of use of DME, Decreased balance, Decreased mobility, Decreased strength, Impaired perceived functional ability, Impaired vision/preception  Visit Diagnosis: Other symptoms and signs involving the nervous system  Other symptoms and signs involving the musculoskeletal system  Other lack of coordination  Abnormal posture  Other abnormalities of gait and mobility  Stiffness of joint, shoulder region, left    Problem List Patient Active Problem List   Diagnosis Date Noted  . PD (Parkinson's disease) ([place]) 12/18/2015  . Tremor of both hands 11/22/2015  . PAD (peripheral artery disease) s/p remote left external iliac artery angioplasty 10/28/2012  . Coronary atherosclerosis 10/28/2012  . Hyperlipidemia 10/28/2012  . HTN (hypertension) 10/28/2012  . DM2 (diabetes mellitus, type 2) ([place])  10/28/2012  . Carotid occlusion, right 10/28/2012    Murriel Holwerda 03/26/2016, 3:34 PM  [facility] [address] [place], [place], [address] Phone: [phone]   Fax:  [phone]  Name: LORNA STAHLBERG MRN: [id number] Date of Birth: 1945/04/11

## 2016-03-26 NOTE — Therapy (Signed)
[facility] [address] [place], [place], [address] Phone: [phone]   Fax:  [phone]  Speech Language Pathology Treatment  Patient Details  Name: Rodney Love MRN: [id number] Date of Birth: December 29, 1945 Referring Provider: Dr. Wells Guiles Tat  Encounter Date: 03/26/2016      End of Session - 03/26/16 1509    Visit Number 11   Number of Visits 17   Date for SLP Re-Evaluation 04/05/16   SLP Start Time [id number]   SLP Stop Time  1400   SLP Time Calculation (min) 43 min   Activity Tolerance Patient tolerated treatment well      Past Medical History:  Diagnosis Date  . Arthritis   . Constipation    OCCASIONAL  . Coronary artery disease   . Diabetes mellitus without complication ([place])   . Environmental allergies   . Hyperlipidemia   . Hypertension   . Inguinal hernia   . Nocturia   . Peripheral arterial disease [facility])     Past Surgical History:  Procedure Laterality Date  . BACK SURGERY    . CARDIAC CATHETERIZATION  05/29/2006   single vessel CAD w/moderate stenosis of the prox. RCA approx 40%,prox. LAD 20-30%,minor irreg. mid abdominal aorta,left iliac artery disease 50-60%  . CATARACT EXTRACTION Right   . HERNIA REPAIR  [id number]  . INGUINAL HERNIA REPAIR N/A 09/23/2012   Procedure: LAPAROSCOPIC INGUINAL HERNIA with umbilical hernia;  Surgeon: Madilyn Hook, DO;  [facility];  Service: General;  Laterality: N/A;  . INSERTION OF MESH Bilateral 09/23/2012   Procedure: INSERTION OF MESH;  Surgeon: Madilyn Hook, DO;  [facility];  Service: General;  Laterality: Bilateral;    There were no vitals filed for this visit.      Subjective Assessment - 03/26/16 1323    Subjective "I think they heard me" at Christmas   Patient is accompained by: Family member   Special Tests daughter               ADULT SLP TREATMENT - 03/26/16 1324      General Information   Behavior/Cognition  Alert;Cooperative;Pleasant mood     Treatment Provided   Treatment provided Cognitive-Linquistic     Cognitive-Linquistic Treatment   Treatment focused on Dysarthria;Voice   Skilled Treatment Recalibrated volume with loud /a/ average of 90dB with supervision cues. Sentence generation 2 given words average of 72dB and supervision cues.  Mildly complex conversation average 70dB over 20 minutes     Assessment / Recommendations / Plan   Plan Continue with current plan of care     Progression Toward Goals   Progression toward goals Progressing toward goals            SLP Short Term Goals - 03/26/16 1509      SLP SHORT TERM GOAL #1   Title Pt will average 90dB on loud /a/ with rare min A over 3 sessions   Period Weeks   Status Achieved     SLP SHORT TERM GOAL #2   Title Pt will average 70dB on 85% of  sentence level structured speech tasks with occasional min A.   Status Achieved     SLP SHORT TERM GOAL #3   Title Pt will maintain average of 70dB during 8 minute simple conversation with occasional min A   Status Achieved          SLP Long Term Goals - 03/26/16 1509      SLP LONG TERM GOAL #  1   Title Pt will maintain average of 70dB during 15 minute moderately complex conversation with rare min A   Time 4   Period Weeks   Status Achieved     SLP LONG TERM GOAL #2   Title Pt. will maintain audible conversation over 12 minutes in noisy environment outside of therapy room.   Time 4   Period Weeks   Status On-going          Plan - 03/26/16 1506    Clinical Impression Statement Pt carrying over loudness in conversation and structured tasks with rare min A. Daughter reports pt is carrying over adequate volume at home "most of the time" and that he "catches himself when he gets quiet and self corrects to louder voice." Continue skilled ST to maximize carryover of audible volume and compensations for aphasia. Consider conversation in noisy environment in next 1-2 sessions.     Speech Therapy Frequency 1x /week   Treatment/Interventions Compensatory strategies;Patient/family education;Functional tasks;Environmental controls;SLP instruction and feedback   Potential to Achieve Goals Good   SLP Home Exercise Plan loud /a/   Consulted and Agree with Plan of Care Patient;Family member/caregiver   Family Member Consulted daughter      Patient will benefit from skilled therapeutic intervention in order to improve the following deficits and impairments:   Dysarthria and anarthria    Problem List Patient Active Problem List   Diagnosis Date Noted  . PD (Parkinson's disease) ([place]) 12/18/2015  . Tremor of both hands 11/22/2015  . PAD (peripheral artery disease) s/p remote left external iliac artery angioplasty 10/28/2012  . Coronary atherosclerosis 10/28/2012  . Hyperlipidemia 10/28/2012  . HTN (hypertension) 10/28/2012  . DM2 (diabetes mellitus, type 2) ([place]) 10/28/2012  . Carotid occlusion, right 10/28/2012    Patric Vanpelt, Annye Rusk MS, CCC-SLP 03/26/2016, 3:10 PM  [facility] [address] [place], [place], [address] Phone: [phone]   Fax:  [phone]   Name: Rodney Love MRN: [id number] Date of Birth: 1945-12-17

## 2016-03-26 NOTE — Patient Instructions (Signed)
(  Exercise) Monday Tuesday Wednesday Thursday Friday Saturday Sunday   PWR! seated           PWR! standing           PWR! Laying down           [employer]! (quadraped -lean over table)           PWR! hands           Coordination Exercises

## 2016-03-27 ENCOUNTER — Ambulatory Visit: Payer: Medicare Other | Admitting: Physical Therapy

## 2016-03-27 DIAGNOSIS — R29898 Other symptoms and signs involving the musculoskeletal system: Secondary | ICD-10-CM | POA: Diagnosis not present

## 2016-03-27 DIAGNOSIS — R2681 Unsteadiness on feet: Secondary | ICD-10-CM | POA: Diagnosis not present

## 2016-03-27 DIAGNOSIS — R471 Dysarthria and anarthria: Secondary | ICD-10-CM | POA: Diagnosis not present

## 2016-03-27 DIAGNOSIS — R29818 Other symptoms and signs involving the nervous system: Secondary | ICD-10-CM | POA: Diagnosis not present

## 2016-03-27 DIAGNOSIS — R278 Other lack of coordination: Secondary | ICD-10-CM | POA: Diagnosis not present

## 2016-03-27 DIAGNOSIS — R2689 Other abnormalities of gait and mobility: Secondary | ICD-10-CM

## 2016-03-27 DIAGNOSIS — M25612 Stiffness of left shoulder, not elsewhere classified: Secondary | ICD-10-CM | POA: Diagnosis not present

## 2016-03-27 DIAGNOSIS — R293 Abnormal posture: Secondary | ICD-10-CM | POA: Diagnosis not present

## 2016-03-27 NOTE — Patient Instructions (Addendum)
[employer] Fitness Program after Therapy for People with Parkinson's Disease  1)  Therapy Home Exercise Program  -Do these Exercises DAILY as instructed by your therapist  -Big, deliberate effort with exercises  -These exercises are important to perform consistently, even when therapist has  finished, because these therapy exercises often address your specific  Parkinson's difficulties   2)  Walking  -  Work up to walking 3-5 times per week, 20-30 minutes per day  -This can be done at home, driveway, quiet street or an indoor track  -Focus should be on your Best posture, arm swing, step length for your best  walking pattern -For Indoor walking-you could try [employer] or big stores or you could look into [employer] using their gym for walking  3)  Aerobic Exercise  -Work up to 3-5 times per week, 30 minutes per day  -This can be stationary bike, seated stepper machine, elliptical machine  -Work up to 7-8/10 intensity during the exercise, at minimal to moderate     Resistance     [place] for [employer] for machines

## 2016-03-28 ENCOUNTER — Ambulatory Visit: Payer: Medicare Other | Admitting: Physical Therapy

## 2016-03-28 ENCOUNTER — Ambulatory Visit: Payer: Medicare Other | Admitting: Occupational Therapy

## 2016-03-28 ENCOUNTER — Encounter: Payer: Self-pay | Admitting: Physical Therapy

## 2016-03-28 ENCOUNTER — Ambulatory Visit: Payer: Medicare Other

## 2016-03-28 DIAGNOSIS — R29898 Other symptoms and signs involving the musculoskeletal system: Secondary | ICD-10-CM

## 2016-03-28 DIAGNOSIS — R29818 Other symptoms and signs involving the nervous system: Secondary | ICD-10-CM

## 2016-03-28 DIAGNOSIS — R2681 Unsteadiness on feet: Secondary | ICD-10-CM

## 2016-03-28 DIAGNOSIS — M25612 Stiffness of left shoulder, not elsewhere classified: Secondary | ICD-10-CM | POA: Diagnosis not present

## 2016-03-28 DIAGNOSIS — R278 Other lack of coordination: Secondary | ICD-10-CM

## 2016-03-28 DIAGNOSIS — R2689 Other abnormalities of gait and mobility: Secondary | ICD-10-CM

## 2016-03-28 DIAGNOSIS — R293 Abnormal posture: Secondary | ICD-10-CM | POA: Diagnosis not present

## 2016-03-28 DIAGNOSIS — R471 Dysarthria and anarthria: Secondary | ICD-10-CM

## 2016-03-28 NOTE — Therapy (Signed)
[facility] [address] [place], [place], [address] Phone: [phone]   Fax:  [phone]  Speech Language Pathology Treatment  Patient Details  Name: Rodney Love MRN: [id number] Date of Birth: July 12, 1945 Referring Provider: Dr. Wells Guiles Tat  Encounter Date: 03/28/2016      End of Session - 03/28/16 1637    Visit Number 12   Number of Visits 17   Date for SLP Re-Evaluation 04/05/16   SLP Start Time 1532   SLP Stop Time  1612   SLP Time Calculation (min) 40 min   Activity Tolerance Patient tolerated treatment well      Past Medical History:  Diagnosis Date  . Arthritis   . Constipation    OCCASIONAL  . Coronary artery disease   . Diabetes mellitus without complication ([place])   . Environmental allergies   . Hyperlipidemia   . Hypertension   . Inguinal hernia   . Nocturia   . Peripheral arterial disease [facility])     Past Surgical History:  Procedure Laterality Date  . BACK SURGERY    . CARDIAC CATHETERIZATION  05/29/2006   single vessel CAD w/moderate stenosis of the prox. RCA approx 40%,prox. LAD 20-30%,minor irreg. mid abdominal aorta,left iliac artery disease 50-60%  . CATARACT EXTRACTION Right   . HERNIA REPAIR  [id number]  . INGUINAL HERNIA REPAIR N/A 09/23/2012   Procedure: LAPAROSCOPIC INGUINAL HERNIA with umbilical hernia;  Surgeon: Madilyn Hook, DO;  [facility];  Service: General;  Laterality: N/A;  . INSERTION OF MESH Bilateral 09/23/2012   Procedure: INSERTION OF MESH;  Surgeon: Madilyn Hook, DO;  [facility];  Service: General;  Laterality: Bilateral;    There were no vitals filed for this visit.      Subjective Assessment - 03/28/16 1541    Subjective Pt conveys he is comfortable with discharge today.    Currently in Pain? No/denies               ADULT SLP TREATMENT - 03/28/16 1541      General Information   Behavior/Cognition Alert;Cooperative;Pleasant mood     Treatment Provided   Treatment provided Cognitive-Linquistic     Cognitive-Linquistic Treatment   Treatment focused on Dysarthria;Voice   Skilled Treatment Recalibrated volume with loud /a/ average of low-90s dB without cues. Detailed picture description (sentence or two-sentence responses) average of 72dB and supervision cues.  Mildly complex conversation average 70dB over 15 minutes, in noisy environment.     Assessment / Recommendations / Plan   Plan --  d/c today as pt has met goals     Progression Toward Goals   Progression toward goals Goals met, education completed, patient discharged from [place] - 03/26/16 Oto #1   Title Pt will average 90dB on loud /a/ with rare min A over 3 sessions   Period Weeks   Status Achieved     SLP SHORT TERM GOAL #2   Title Pt will average 70dB on 85% of  sentence level structured speech tasks with occasional min A.   Status Achieved     SLP SHORT TERM GOAL #3   Title Pt will maintain average of 70dB during 8 minute simple conversation with occasional min A   Status Achieved          SLP Long Term Goals - 03/28/16 7342  SLP LONG TERM GOAL #1   Title Pt will maintain average of 70dB during 15 minute moderately complex conversation with rare min A   Status Achieved     SLP LONG TERM GOAL #2   Title Pt. will maintain audible conversation over 12 minutes in noisy environment outside of therapy room.   Time 4   Status Achieved          Plan - 03-30-16 1637    Clinical Impression Statement Pt carrying over loudness in conversation and structured tasks with independence today. Daughter reported earlier this week pt carrying over adequate volume at home "most of the time" and that he "catches himself when he gets quiet and self corrects to louder voice." Discharge ST at this time, due to pt meeting goals.    Speech Therapy Frequency 1x /week   Treatment/Interventions  Compensatory strategies;Patient/family education;Functional tasks;Environmental controls;SLP instruction and feedback   Potential to Achieve Goals Good   SLP Home Exercise Plan loud /a/   Consulted and Agree with Plan of Care Patient;Family member/caregiver   Family Member Consulted daughter      Patient will benefit from skilled therapeutic intervention in order to improve the following deficits and impairments:   Dysarthria and anarthria      G-Codes - 03-30-2016 1639    Functional Assessment Tool Used NOMS   Functional Limitations Motor speech   Motor Speech Goal Status [phone]) At least 1 percent but less than 20 percent impaired, limited or restricted   Motor Speech Goal Status (F7588) At least 1 percent but less than 20 percent impaired, limited or restricted     [place]  Visits from Start of Care: 12  Current functional level related to goals / functional outcomes: See goal update above. Pt met all STGs and LTGs more quickly than anticipated.   Remaining deficits: Minimal dysarthria.   Education / Equipment: Compensations for dysarthria, loud "ah".  Plan: Patient agrees to discharge.  Patient goals were met. Patient is being discharged due to meeting the stated rehab goals.  ?????      Problem List Patient Active Problem List   Diagnosis Date Noted  . PD (Parkinson's disease) ([place]) 12/18/2015  . Tremor of both hands 11/22/2015  . PAD (peripheral artery disease) s/p remote left external iliac artery angioplasty 10/28/2012  . Coronary atherosclerosis 10/28/2012  . Hyperlipidemia 10/28/2012  . HTN (hypertension) 10/28/2012  . DM2 (diabetes mellitus, type 2) ([place]) 10/28/2012  . Carotid occlusion, right 10/28/2012    [facility], CCC-SLP  03/30/2016, 4:39 PM  [facility] [address] [place], [place], [address] Phone: [phone]   Fax:  [phone]   Name: Rodney Love MRN: [id number] Date of Birth: Jan 28, 1946

## 2016-03-28 NOTE — Therapy (Signed)
[facility] [address] [place], [place], [address] Phone: [phone]   Fax:  [phone]  Physical Therapy Treatment  Patient Details  Name: Rodney Love MRN: [id number] Date of Birth: Feb 13, 1946 Referring Provider: Wells Guiles Tat  Encounter Date: 03/28/2016      PT End of Session - 03/28/16 1447    Visit Number 12   Number of Visits 17   Date for PT Re-Evaluation 03/30/16   Authorization Type UHC Medicare-GCODE every 10th visit   PT Start Time 1403   PT Stop Time 1445   PT Time Calculation (min) 42 min   Activity Tolerance Patient tolerated treatment well   Behavior During Therapy [facility] for tasks assessed/performed      Past Medical History:  Diagnosis Date  . Arthritis   . Constipation    OCCASIONAL  . Coronary artery disease   . Diabetes mellitus without complication ([place])   . Environmental allergies   . Hyperlipidemia   . Hypertension   . Inguinal hernia   . Nocturia   . Peripheral arterial disease [facility])     Past Surgical History:  Procedure Laterality Date  . BACK SURGERY    . CARDIAC CATHETERIZATION  05/29/2006   single vessel CAD w/moderate stenosis of the prox. RCA approx 40%,prox. LAD 20-30%,minor irreg. mid abdominal aorta,left iliac artery disease 50-60%  . CATARACT EXTRACTION Right   . HERNIA REPAIR  [id number]  . INGUINAL HERNIA REPAIR N/A 09/23/2012   Procedure: LAPAROSCOPIC INGUINAL HERNIA with umbilical hernia;  Surgeon: Madilyn Hook, DO;  Location: [facility];  Service: General;  Laterality: N/A;  . INSERTION OF MESH Bilateral 09/23/2012   Procedure: INSERTION OF MESH;  Surgeon: Madilyn Hook, DO;  Location: [facility];  Service: General;  Laterality: Bilateral;    There were no vitals filed for this visit.      Subjective Assessment - 03/28/16 1406    Subjective No c/o changes of falls since last visit.   Patient is accompained by: Family member   Pertinent History Ruptured disc in low  back   Patient Stated Goals Pt's goals for therapy is to improve walking and balance.   Currently in Pain? No/denies                         [facility] Adult PT Treatment/Exercise - 03/28/16 1554      Ambulation/Gait   Ambulation/Gait Yes   Ambulation/Gait Assistance 6: Modified independent (Device/Increase time)   Ambulation Distance (Feet) 100 Feet   Assistive device None   Gait Pattern Step-through pattern;Decreased arm swing - right;Decreased arm swing - left;Decreased step length - left;Narrow base of support   Ambulation Surface Level;Indoor   Gait velocity 8.35 sec = 3.93 ft/sec           PWR [facility]) - 03/28/16 1600 PRONE   PWR! Up x10   PWR! Rock x10   PWR! Twist x10   PWR! Step x10   Comments prone on floor mat with pillows under hips; cues for technique.             PT Education - 03/28/16 1559    Education provided Yes   Education Details Helped pt organize HEP binder for greater complinace.   Person(s) Educated Patient   Methods Explanation   Comprehension Verbalized understanding          PT Short Term Goals - 03/13/16 1159      PT SHORT TERM GOAL #1  Title Pt will be independent with HEP for improved posture, transfers, balance and gait.  TARGET 02/19/16   Time 4   Period Weeks   Status Partially Met     PT SHORT TERM GOAL #2   Title Pt will improve 5x sit<>stand transfers to less than or equal to 15 seconds for improved efficiency and safety of transfers.   Time 4   Period Weeks   Status Achieved     PT SHORT TERM GOAL #3   Title Pt will improve TUG score to less than or equal to 13.5 seconds for decreased fall risk.   Time 4   Period Weeks   Status Achieved     PT SHORT TERM GOAL #4   Title Pt will improve SLS to at least 3 seconds each leg, for improved obstacle and step negotiation.   Time 4   Period Weeks   Status Partially Met     PT SHORT TERM GOAL #5   Title Pt will verbalize understanding of local Parkinson's  disease resources.   Time 4   Period Weeks   Status New           PT Long Term Goals - 03/28/16 1548      PT LONG TERM GOAL #1   Title Pt will verbalize understanding of fall prevention in home environment.  TARGET 03/30/16   Time 8   Period Weeks   Status Achieved     PT LONG TERM GOAL #2   Title Pt will improve 5x sit<>stand to less than or equal to 11.5 seconds for improved transfer efficiency and safety.   Time 8   Period Weeks   Status Achieved     PT LONG TERM GOAL #3   Title Pt will improve TUG cognitive score to less than or equal to 15 seconds for decreased fall risk.   Time 8   Period Weeks   Status Achieved     PT LONG TERM GOAL #4   Title Pt will improve MiniBESTest to at least 24/28 for decreased fall risk.   Time 8   Period Weeks   Status Achieved     PT LONG TERM GOAL #5   Title Pt will verbalize plans for continued community fitness upon D/C from PT.   Baseline Met, 03/18/16.   Time 8   Period Weeks   Status Achieved               Plan - 03/28/16 1556    Clinical Impression Statement Pt has progressed with gait velocity and met all LTGs.  Pt was able to tolerate prone PWR! Moves and able to follow directions for technique.  Pt has good continuing fitness options and plans to look at community fitness centers with his wife.                              Marland Kitchen   PT Treatment/Interventions ADLs/Self Care Home Management;Functional mobility training;Gait training;Therapeutic activities;Therapeutic exercise;Balance training;Neuromuscular re-education;Patient/family education   PT Next Visit Plan Try PWR! Moves in prone (to assess for PWR! Moves community exercise class) and follow up with pt/daughter on community fitness plans; discuss return PT eval in 6 months; plan d/c next visit   Consulted and Agree with Plan of Care Patient      Patient will benefit from skilled therapeutic intervention in order to improve the following deficits and impairments:   Abnormal gait, Decreased balance, Decreased  mobility, Decreased strength, Difficulty walking, Impaired flexibility, Postural dysfunction  Visit Diagnosis: Other abnormalities of gait and mobility  Abnormal posture  Unsteadiness on feet  Other symptoms and signs involving the nervous system       G-Codes - 2016/04/08 1552    Functional Assessment Tool Used 5x sit<>stand 11.5 sec; gait velocity 3.93 ft/sec      Problem List Patient Active Problem List   Diagnosis Date Noted  . PD (Parkinson's disease) ([place]) 12/18/2015  . Tremor of both hands 11/22/2015  . PAD (peripheral artery disease) s/p remote left external iliac artery angioplasty 10/28/2012  . Coronary atherosclerosis 10/28/2012  . Hyperlipidemia 10/28/2012  . HTN (hypertension) 10/28/2012  . DM2 (diabetes mellitus, type 2) ([place]) 10/28/2012  . Carotid occlusion, right 10/28/2012   Bjorn Loser, PTA  Apr 08, 2016, 4:03 PM [facility] [address] [place], [place], [address] Phone: [phone]   Fax:  [phone]  Name: CHUNG CHAGOYA MRN: [id number] Date of Birth: 10/14/1945

## 2016-03-28 NOTE — Therapy (Signed)
[facility] [address] [place], [place], [address] Phone: [phone]   Fax:  [phone]  Occupational Therapy Treatment  Patient Details  Name: Rodney Love MRN: [id number] Date of Birth: April 09, 1945 Referring Provider: Dr. Carles Collet  Encounter Date: 03/28/2016      OT End of Session - 03/28/16 1449    Visit Number 15   Number of Visits 17   Date for OT Re-Evaluation 03/29/16   Authorization Type UHC Medicare   Authorization - Visit Number 15   Authorization - Number of Visits 20   OT Start Time 1601   OT Stop Time 1530   OT Time Calculation (min) 42 min   Activity Tolerance Patient tolerated treatment well   Behavior During Therapy [facility] for tasks assessed/performed      Past Medical History:  Diagnosis Date  . Arthritis   . Constipation    OCCASIONAL  . Coronary artery disease   . Diabetes mellitus without complication ([place])   . Environmental allergies   . Hyperlipidemia   . Hypertension   . Inguinal hernia   . Nocturia   . Peripheral arterial disease [facility])     Past Surgical History:  Procedure Laterality Date  . BACK SURGERY    . CARDIAC CATHETERIZATION  05/29/2006   single vessel CAD w/moderate stenosis of the prox. RCA approx 40%,prox. LAD 20-30%,minor irreg. mid abdominal aorta,left iliac artery disease 50-60%  . CATARACT EXTRACTION Right   . HERNIA REPAIR  [id number]  . INGUINAL HERNIA REPAIR N/A 09/23/2012   Procedure: LAPAROSCOPIC INGUINAL HERNIA with umbilical hernia;  Surgeon: Madilyn Hook, DO;  [facility];  Service: General;  Laterality: N/A;  . INSERTION OF MESH Bilateral 09/23/2012   Procedure: INSERTION OF MESH;  Surgeon: Madilyn Hook, DO;  [facility];  Service: General;  Laterality: Bilateral;    There were no vitals filed for this visit.      Subjective Assessment - 03/28/16 1550    Subjective  "I'm going to keep up my exercises.   Pertinent History see Epic, hx of back  surgery   Patient Stated Goals improve use of LUE   Currently in Pain? No/denies      Self Care:    Educated pt on purpose/process of re-evaluation/screens in approx 6 months to prevent future complications with progressive nature of PD.  Pt verbalized understanding and agreed to screen.  Reviewed use of large amplitude movement strategies to incr ease with ADLs and prevent future complications (in addition to HEP/community exercise).  Reviewed community resources briefly.  Opening/closing bottles with use of large amplitude movement strategies with min cueing, particularly for L hand.  Dressing:  Practiced donning/doffing jacket using large amplitude movement strategies after initial instruction.  Pt demo improvement with repetition min-mod cues for large amplitude movements and strategies (collar turned out, twist, push arm in, pull jacket across back by collar, adjustment with PWR! Hand).  Then simulated donning pants with bag with min cueing for incr movement amplitude, particularly on L side.  Functional mobility:  Instructed pt in use of wt. Shift with feet apart and trunk rotation vs. Small steps/turns for incr ease with ADLs.  Practiced using large amplitude movements for functional reach (with PWR! Hands) to grasp/release cylinder objects incorporating wt. Shift and trunk rotation for larger reach.  Pt returned demo with min cueing.  OT Education - 03/28/16 1551    Education Details Importance of performing large amplitude movements with ADLs and during HEP to prevent future complications   Person(s) Educated Patient   Methods Explanation   Comprehension Verbalized understanding          OT Short Term Goals - 03/15/16 1321      OT SHORT TERM GOAL #1   Title I with PD specific HEP. due 02/29/16   Time 4   Period Weeks   Status Achieved     OT SHORT TERM GOAL #2   Title Pt will verbalize understanding of adapted strateiges for  ADLs/IADLs.   Time 4   Period Weeks   Status Achieved     OT SHORT TERM GOAL #3   Title Pt will demonstrate ability to retrieve a lightweight object at 115 shoulder flexion , and -10 elbow extension with LUE with pain less than or equal to 2/10.   Time 4   Period Weeks   Status Achieved     OT SHORT TERM GOAL #4   Title Pt will demonstrate improved fine motor coordination as evidenced by decreasing LUE 9 hole peg test score to 36 secs or less.   Baseline RUE: 28.47 secs, LUE 41.19 secs   Time 8   Period Weeks   Status Achieved  37.09, 28.50- approximating average meets goal           OT Long Term Goals - 03/28/16 1451      OT LONG TERM GOAL #1   Title Pt will verbalize understanding of ways to keep thinking skills sharp, memory compensations PRN..   Time 8   Period Weeks   Status Achieved     OT LONG TERM GOAL #2   Title Pt will verbalize understanding of community resources and ways to prevent future PD- related complications.   Time 8   Period Weeks   Status Achieved  03/28/16     OT LONG TERM GOAL #3   Title Pt will decrease 3 button/ unbutton score to 40 secs or less.   Status Achieved  69.13 secs;  03/21/16:  21.13sec while wearing shirt (incr difficulty on tabletop)     OT LONG TERM GOAL #4   Title Pt will  demonstrate improved ease with dressing as evidenced by performing PPT# 4(don/doff jacket) in 24 secs or less   Time 8   Period Weeks   Status Achieved  18.47 secs               Plan - 03/28/16 1450    Clinical Impression Statement Pt demo good progress with incr ease of ADLs, improved movement amplitude, improved coordination, and understanding of education to prevent future complications.   Rehab Potential Good   OT Frequency 2x / week   OT Treatment/Interventions Self-care/ADL training;Moist Heat;Fluidtherapy;DME and/or AE instruction;Patient/family education;Balance training;Therapeutic exercises;Ultrasound;Therapeutic exercise;Therapeutic  activities;Passive range of motion;Cognitive remediation/compensation;Functional Mobility Training;Neuromuscular education;Parrafin;Energy conservation;Manual Therapy;Visual/perceptual remediation/compensation   Plan d/c OT, screen/re-eval in approx 6 months   OT Home Exercise Plan Education provided:  PWR! sitting (up, rock, step); PWR! supine (basic 4),coordination; PWR! quadraped (up, rock, step)   Consulted and Agree with Plan of Care Patient      Patient will benefit from skilled therapeutic intervention in order to improve the following deficits and impairments:  Abnormal gait, Decreased coordination, Decreased range of motion, Difficulty walking, Impaired flexibility, Decreased safety awareness, Decreased endurance, Decreased activity tolerance, Decreased knowledge of precautions, Impaired tone, Impaired UE functional use,  Pain, Decreased knowledge of use of DME, Decreased balance, Decreased mobility, Decreased strength, Impaired perceived functional ability, Impaired vision/preception  Visit Diagnosis: Other symptoms and signs involving the nervous system  Other symptoms and signs involving the musculoskeletal system  Unsteadiness on feet  Abnormal posture  Other abnormalities of gait and mobility  Other lack of coordination  Stiffness of joint, shoulder region, left      G-Codes - 04-24-2016 1554    Functional Assessment Tool Used 3 button/ unbutton: 21.13 secs when wearing shirt,  9 hole peg test:LUE 37.09, 28.50 secs   Functional Limitation Carrying, moving and handling objects   Carrying, Moving and Handling Objects Goal Status (S9702) At least 20 percent but less than 40 percent impaired, limited or restricted   Carrying, Moving and Handling Objects Discharge Status [id number]) At least 20 percent but less than 40 percent impaired, limited or restricted      Problem List Patient Active Problem List   Diagnosis Date Noted  . PD (Parkinson's disease) ([place]) 12/18/2015  .  Tremor of both hands 11/22/2015  . PAD (peripheral artery disease) s/p remote left external iliac artery angioplasty 10/28/2012  . Coronary atherosclerosis 10/28/2012  . Hyperlipidemia 10/28/2012  . HTN (hypertension) 10/28/2012  . DM2 (diabetes mellitus, type 2) ([place]) 10/28/2012  . Carotid occlusion, right 10/28/2012    OCCUPATIONAL THERAPY DISCHARGE SUMMARY  Visits from Start of Care: 15  Current functional level related to goals / functional outcomes: See above   Remaining deficits: Bradykinesia, rigidity, abnormal posture, decr balance for ADLs, decr coordination   Education / Equipment:  Pt/family was instructed in the following:  PD-specific HEP, adaptive strategies for ADLs/IADLs, ways to prevent future complications, appropriate community resources.  Pt verbalized understanding of all education provided.   Plan: Patient agrees to discharge.  Patient goals were met. Patient is being discharged due to meeting the stated rehab goals.  Pt would benefit from re-evaluation/occupational therapy screen in approx 6 months to assess for need for further therapy/functional changes due to progressive nature of diagnosis. ?????        [facility] 04/24/16, 3:56 PM  [facility] [address] [place], [place], [address] Phone: [phone]   Fax:  [phone]  Name: Rodney Love MRN: [id number] Date of Birth: 03-Feb-1946   Vianne Bulls, OTR/L [facility] [address] [place], [place]  [address] [phone] phone [phone] 24-Apr-2016 3:56 PM

## 2016-03-28 NOTE — Therapy (Signed)
[facility] [address] [place], [place], [address] Phone: [phone]   Fax:  [phone]  Physical Therapy Treatment  Patient Details  Name: Rodney Love MRN: [id number] Date of Birth: 1945-12-06 Referring Provider: Wells Guiles Tat  Encounter Date: 03/27/2016      PT End of Session - 03/28/16 0912    Visit Number 11   Number of Visits 17   Date for PT Re-Evaluation 03/30/16   Authorization Type UHC Medicare-GCODE every 10th visit   PT Start Time 1157  Pt arrives late   PT Stop Time 1233   PT Time Calculation (min) 36 min   Activity Tolerance Patient tolerated treatment well   Behavior During Therapy [facility] for tasks assessed/performed      Past Medical History:  Diagnosis Date  . Arthritis   . Constipation    OCCASIONAL  . Coronary artery disease   . Diabetes mellitus without complication ([place])   . Environmental allergies   . Hyperlipidemia   . Hypertension   . Inguinal hernia   . Nocturia   . Peripheral arterial disease [facility])     Past Surgical History:  Procedure Laterality Date  . BACK SURGERY    . CARDIAC CATHETERIZATION  05/29/2006   single vessel CAD w/moderate stenosis of the prox. RCA approx 40%,prox. LAD 20-30%,minor irreg. mid abdominal aorta,left iliac artery disease 50-60%  . CATARACT EXTRACTION Right   . HERNIA REPAIR  [id number]  . INGUINAL HERNIA REPAIR N/A 09/23/2012   Procedure: LAPAROSCOPIC INGUINAL HERNIA with umbilical hernia;  Surgeon: Madilyn Hook, DO;  [facility];  Service: General;  Laterality: N/A;  . INSERTION OF MESH Bilateral 09/23/2012   Procedure: INSERTION OF MESH;  Surgeon: Madilyn Hook, DO;  [facility];  Service: General;  Laterality: Bilateral;    There were no vitals filed for this visit.      Subjective Assessment - 03/27/16 1200    Subjective No c/o changes of falls since last visit.   Patient is accompained by: Family member   Pertinent History  Ruptured disc in low back   Patient Stated Goals Pt's goals for therapy is to improve walking and balance.   Currently in Pain? No/denies                         [facility] Adult PT Treatment/Exercise - 03/28/16 0001      Ambulation/Gait   Ambulation/Gait Yes   Ambulation/Gait Assistance 5: Supervision   Ambulation Distance (Feet) 800 Feet   Assistive device None   Gait Pattern Step-through pattern;Decreased arm swing - right;Decreased arm swing - left;Decreased step length - left;Narrow base of support   Ambulation Surface Level;Indoor   Gait Comments Cues for increased step length and increased arm swing with gait; conversational tasks during gait with slightly decreased speed     Timed Up and Go Test   TUG Normal TUG;Cognitive TUG   Normal TUG (seconds) 10.3   Cognitive TUG (seconds) 10.8         Self Care: Discussed the following during today's PT session:  -Optimal PD fitness:  Therapy HEP, walking program (provided suggestions for indoor walking tracks), and aerobic exercise (provided suggestions for [facility],  [employer], [employer] for aerobic activity in addition to pt's foot pedaler at home)  -Fall prevention in home environment  -PWR! Moves community exercise class as option for continued fitness-provided handouts for class  -Discussed improvements in functional measures,  with plans for discharge next visit-pt/daughter in agreement       PT Education - 03/28/16 0911    Education provided Yes   Education Details Fall prevention, Optimal fitness program for people with PD upon d/c from therapy; discussed [facility], [facility] for use of aerobic machines; discussed PWR! Moves exercise classes, plans for d/c next visit   Person(s) Educated Patient;Child(ren)   Methods Explanation;Handout   Comprehension Verbalized understanding          PT Short Term Goals - 03/13/16 1159      PT SHORT TERM GOAL #1   Title Pt will be  independent with HEP for improved posture, transfers, balance and gait.  TARGET 02/19/16   Time 4   Period Weeks   Status Partially Met     PT SHORT TERM GOAL #2   Title Pt will improve 5x sit<>stand transfers to less than or equal to 15 seconds for improved efficiency and safety of transfers.   Time 4   Period Weeks   Status Achieved     PT SHORT TERM GOAL #3   Title Pt will improve TUG score to less than or equal to 13.5 seconds for decreased fall risk.   Time 4   Period Weeks   Status Achieved     PT SHORT TERM GOAL #4   Title Pt will improve SLS to at least 3 seconds each leg, for improved obstacle and step negotiation.   Time 4   Period Weeks   Status Partially Met     PT SHORT TERM GOAL #5   Title Pt will verbalize understanding of local Parkinson's disease resources.   Time 4   Period Weeks   Status New           PT Long Term Goals - 03/27/16 1221      PT LONG TERM GOAL #1   Title Pt will verbalize understanding of fall prevention in home environment.  TARGET 03/30/16   Time 8   Period Weeks   Status Achieved     PT LONG TERM GOAL #2   Title Pt will improve 5x sit<>stand to less than or equal to 11.5 seconds for improved transfer efficiency and safety.   Time 8   Period Weeks   Status Achieved     PT LONG TERM GOAL #3   Title Pt will improve TUG cognitive score to less than or equal to 15 seconds for decreased fall risk.   Time 8   Period Weeks   Status Achieved     PT LONG TERM GOAL #4   Title Pt will improve MiniBESTest to at least 24/28 for decreased fall risk.   Time 8   Period Weeks   Status Achieved     PT LONG TERM GOAL #5   Title Pt will verbalize plans for continued community fitness upon D/C from PT.   Time 8   Period Weeks   Status New               Plan - 03/28/16 0914    Clinical Impression Statement Per 03/21/16 visit, pt has met LTG 2, 3, 4 for improved functional measures.  Today, pt has met LTG 1 for fall prevention.   Discussed options for community fitness, including PWR! Moves community exercise class.  Would like to address PWR! Moves in prone and further discuss pt's best fit for community fitness, with plans for d/c next visit.   PT Treatment/Interventions ADLs/Self Care Home Management;Functional  mobility training;Gait training;Therapeutic activities;Therapeutic exercise;Balance training;Neuromuscular re-education;Patient/family education   PT Next Visit Plan Try PWR! Moves in prone (to assess for PWR! Moves community exercise class) and follow up with pt/daughter on community fitness plans; discuss return PT eval in 6 months; plan d/c next visit   Consulted and Agree with Plan of Care Patient      Patient will benefit from skilled therapeutic intervention in order to improve the following deficits and impairments:  Abnormal gait, Decreased balance, Decreased mobility, Decreased strength, Difficulty walking, Impaired flexibility, Postural dysfunction  Visit Diagnosis: Other abnormalities of gait and mobility  Abnormal posture     Problem List Patient Active Problem List   Diagnosis Date Noted  . PD (Parkinson's disease) ([place]) 12/18/2015  . Tremor of both hands 11/22/2015  . PAD (peripheral artery disease) s/p remote left external iliac artery angioplasty 10/28/2012  . Coronary atherosclerosis 10/28/2012  . Hyperlipidemia 10/28/2012  . HTN (hypertension) 10/28/2012  . DM2 (diabetes mellitus, type 2) ([place]) 10/28/2012  . Carotid occlusion, right 10/28/2012    Sangita Zani W. 03/28/2016, 9:17 AM  Frazier Butt., PT [facility] [address] [place], [place], [address] Phone: [phone]   Fax:  [phone]  Name: JARRETTE DEHNER MRN: [id number] Date of Birth: 06-Sep-1945

## 2016-03-28 NOTE — Patient Instructions (Signed)
Continue to do your loud "ah"s, 5 times, TWICE each day.

## 2016-03-29 NOTE — Therapy (Signed)
[facility] [address] [place], [place], [address] Phone: [phone]   Fax:  [phone]  Physical Therapy Treatment  Patient Details  Name: Rodney Love MRN: [id number] Date of Birth: 27-May-1945 Referring Provider: Wells Guiles Tat  Encounter Date: 03/28/2016      PT End of Session - 03/28/16 1447    Visit Number 12   Number of Visits 17   Date for PT Re-Evaluation 03/30/16   Authorization Type [employer] Medicare-GCODE every 10th visit   PT Start Time 1403   PT Stop Time 1445   PT Time Calculation (min) 42 min   Activity Tolerance Patient tolerated treatment well   Behavior During Therapy [facility] for tasks assessed/performed      Past Medical History:  Diagnosis Date  . Arthritis   . Constipation    OCCASIONAL  . Coronary artery disease   . Diabetes mellitus without complication ([place])   . Environmental allergies   . Hyperlipidemia   . Hypertension   . Inguinal hernia   . Nocturia   . Peripheral arterial disease [facility])     Past Surgical History:  Procedure Laterality Date  . BACK SURGERY    . CARDIAC CATHETERIZATION  05/29/2006   single vessel CAD w/moderate stenosis of the prox. RCA approx 40%,prox. LAD 20-30%,minor irreg. mid abdominal aorta,left iliac artery disease 50-60%  . CATARACT EXTRACTION Right   . HERNIA REPAIR  [id number]  . INGUINAL HERNIA REPAIR N/A 09/23/2012   Procedure: LAPAROSCOPIC INGUINAL HERNIA with umbilical hernia;  Surgeon: Madilyn Hook, DO;  Location: [facility];  Service: General;  Laterality: N/A;  . INSERTION OF MESH Bilateral 09/23/2012   Procedure: INSERTION OF MESH;  Surgeon: Madilyn Hook, DO;  Location: [facility];  Service: General;  Laterality: Bilateral;    There were no vitals filed for this visit.      Subjective Assessment - 03/28/16 1406    Subjective No c/o changes of falls since last visit.   Patient is accompained by: Family member   Pertinent History Ruptured disc in low  back   Patient Stated Goals Pt's goals for therapy is to improve walking and balance.   Currently in Pain? No/denies                         [facility] Adult PT Treatment/Exercise - 03/28/16 1554      Ambulation/Gait   Ambulation/Gait Yes   Ambulation/Gait Assistance 6: Modified independent (Device/Increase time)   Ambulation Distance (Feet) 100 Feet   Assistive device None   Gait Pattern Step-through pattern;Decreased arm swing - right;Decreased arm swing - left;Decreased step length - left;Narrow base of support   Ambulation Surface Level;Indoor   Gait velocity 8.35 sec = 3.93 ft/sec           P[facility]) - 03/28/16 1600    PWR! Up x10   PWR! Rock x10   PWR! Twist x10   PWR! Step x10   Comments prone on floor mat with pillows under hips; cues for technique.             PT Education - 03/28/16 1559    Education provided Yes   Education Details Helped pt organize HEP binder for greater complinace.   Person(s) Educated Patient   Methods Explanation   Comprehension Verbalized understanding          PT Short Term Goals - 03/13/16 1159      PT SHORT TERM GOAL #1  Title Pt will be independent with HEP for improved posture, transfers, balance and gait.  TARGET 02/19/16   Time 4   Period Weeks   Status Partially Met     PT SHORT TERM GOAL #2   Title Pt will improve 5x sit<>stand transfers to less than or equal to 15 seconds for improved efficiency and safety of transfers.   Time 4   Period Weeks   Status Achieved     PT SHORT TERM GOAL #3   Title Pt will improve TUG score to less than or equal to 13.5 seconds for decreased fall risk.   Time 4   Period Weeks   Status Achieved     PT SHORT TERM GOAL #4   Title Pt will improve SLS to at least 3 seconds each leg, for improved obstacle and step negotiation.   Time 4   Period Weeks   Status Partially Met     PT SHORT TERM GOAL #5   Title Pt will verbalize understanding of local Parkinson's  disease resources.   Time 4   Period Weeks   Status New           PT Long Term Goals - 03/28/16 1548      PT LONG TERM GOAL #1   Title Pt will verbalize understanding of fall prevention in home environment.  TARGET 03/30/16   Time 8   Period Weeks   Status Achieved     PT LONG TERM GOAL #2   Title Pt will improve 5x sit<>stand to less than or equal to 11.5 seconds for improved transfer efficiency and safety.   Time 8   Period Weeks   Status Achieved     PT LONG TERM GOAL #3   Title Pt will improve TUG cognitive score to less than or equal to 15 seconds for decreased fall risk.   Time 8   Period Weeks   Status Achieved     PT LONG TERM GOAL #4   Title Pt will improve MiniBESTest to at least 24/28 for decreased fall risk.   Time 8   Period Weeks   Status Achieved     PT LONG TERM GOAL #5   Title Pt will verbalize plans for continued community fitness upon D/C from PT.   Baseline Met, 03/18/16.   Time 8   Period Weeks   Status Achieved               Plan - 03/28/16 1556    Clinical Impression Statement Pt has progressed with gait velocity and met all LTGs.  Pt was able to tolerate prone PWR! Moves andable to follow directions for technique.  Pt has good continuing fitness options and plans to look at community fitness centers with his wife.                              Marland Kitchen   PT Treatment/Interventions ADLs/Self Care Home Management;Functional mobility training;Gait training;Therapeutic activities;Therapeutic exercise;Balance training;Neuromuscular re-education;Patient/family education   PT Next Visit Plan Try PWR! Moves in prone (to assess for PWR! Moves community exercise class) and follow up with pt/daughter on community fitness plans; discuss return PT eval in 6 months; plan d/c next visit   Consulted and Agree with Plan of Care Patient      Patient will benefit from skilled therapeutic intervention in order to improve the following deficits and impairments:   Abnormal gait, Decreased balance, Decreased mobility,  Decreased strength, Difficulty walking, Impaired flexibility, Postural dysfunction  Visit Diagnosis: Other abnormalities of gait and mobility  Abnormal posture  Unsteadiness on feet  Other symptoms and signs involving the nervous system       G-Codes - 04-21-2016 1552    Functional Assessment Tool Used 5x sit<>stand 11.5 sec; gait velocity 3.93 ft/sec   Functional Limitation Mobility: Walking and moving around   Mobility: Walking and Moving Around Goal Status [phone]) At least 1 percent but less than 20 percent impaired, limited or restricted   Mobility: Walking and Moving Around Discharge Status [phone]) At least 1 percent but less than 20 percent impaired, limited or restricted      Problem List Patient Active Problem List   Diagnosis Date Noted  . PD (Parkinson's disease) ([place]) 12/18/2015  . Tremor of both hands 11/22/2015  . PAD (peripheral artery disease) s/p remote left external iliac artery angioplasty 10/28/2012  . Coronary atherosclerosis 10/28/2012  . Hyperlipidemia 10/28/2012  . HTN (hypertension) 10/28/2012  . DM2 (diabetes mellitus, type 2) ([place]) 10/28/2012  . Carotid occlusion, right 10/28/2012   Treatment performed by Bjorn Loser, PTA on 04-21-16  G-Code and d/c summary added by Mady Haagensen, PT 03/29/16 1:14 PM Phone: [phone] Fax: [place]. 03/29/2016, 1:12 PM  [facility] [address] [place], [place], [address] Phone: [phone]   Fax:  [phone]  Name: Rodney Love MRN: [id number] Date of Birth: Apr 29, 1945  PHYSICAL THERAPY DISCHARGE SUMMARY  Visits from Start of Care: 12  Current functional level related to goals / functional outcomes: See LTGs above   Remaining deficits: Posture, bradykinesia (improving)   Education / Equipment: Pt educated in HEP, fall prevention, Parkinson's resources and  community fitness.   Plan: Patient agrees to discharge.  Patient goals were met. Patient is being discharged due to meeting the stated rehab goals.  ?????Recommend return screen or eval in 6 months for PT due to progressive nature of disease.  Mady Haagensen, PT 03/29/16 1:16 PM Phone: [phone] Fax: [phone]

## 2016-04-02 ENCOUNTER — Encounter: Payer: Medicare Other | Admitting: Occupational Therapy

## 2016-04-02 ENCOUNTER — Ambulatory Visit: Payer: Medicare Other | Admitting: Physical Therapy

## 2016-04-04 ENCOUNTER — Encounter: Payer: Medicare Other | Admitting: Occupational Therapy

## 2016-04-04 ENCOUNTER — Encounter: Payer: Medicare Other | Admitting: Speech Pathology

## 2016-04-04 ENCOUNTER — Ambulatory Visit: Payer: Medicare Other | Admitting: Physical Therapy

## 2016-04-15 DIAGNOSIS — D0462 Carcinoma in situ of skin of left upper limb, including shoulder: Secondary | ICD-10-CM | POA: Diagnosis not present

## 2016-04-15 DIAGNOSIS — C4492 Squamous cell carcinoma of skin, unspecified: Secondary | ICD-10-CM | POA: Diagnosis not present

## 2016-05-07 DIAGNOSIS — L570 Actinic keratosis: Secondary | ICD-10-CM | POA: Diagnosis not present

## 2016-05-07 DIAGNOSIS — D0462 Carcinoma in situ of skin of left upper limb, including shoulder: Secondary | ICD-10-CM | POA: Diagnosis not present

## 2016-05-07 DIAGNOSIS — D225 Melanocytic nevi of trunk: Secondary | ICD-10-CM | POA: Diagnosis not present

## 2016-05-07 DIAGNOSIS — X32XXXA Exposure to sunlight, initial encounter: Secondary | ICD-10-CM | POA: Diagnosis not present

## 2016-05-14 DIAGNOSIS — H2512 Age-related nuclear cataract, left eye: Secondary | ICD-10-CM | POA: Diagnosis not present

## 2016-05-14 DIAGNOSIS — E119 Type 2 diabetes mellitus without complications: Secondary | ICD-10-CM | POA: Diagnosis not present

## 2016-06-12 NOTE — Progress Notes (Signed)
Rodney Love was seen today in the movement disorders clinic for neurologic consultation at the request of Horatio Pel, MD.  The consultation is for the evaluation of tremor.  The records that were made available to me were reviewed. This patient is accompanied in the office by his daughter who supplements the history.  The records that were made available to me were reviewed.  Cardiology notes do indicate resting tremor.  Cardiology thought that it was likely ET and propranolol was started on 11/22/15.  He was given 10 mg bid but states that he couldn't handle that due to lightheaded/dizzy and went to 5 mg bid.  He thinks that it helps some.   02/13/16 update:  Pt was seen in f/u, accompanied by his daughter who supplements the history.  Started on carbidopa/levodopa 25/100 tid last visit after being seen and dx with PD.  If he doesn't take it on time, he will get "jittery."  His propranolol was d/c by his cardiologist.  He had an MRI brain of the brain on 12/25/15 demonstrating scattered, mild-mod T2 hyperintensities, slighly more at grey-white jxn. It also demonstrated a known, right carotid occlusion, which cardiology has been following with carotid u/s, last done in March, 2017.  He is still attending PD therapies at the neurorehab center and those notes were reviewed.  06/13/16 update:  Patient follows up today, accompanied by his daughter who supplements the history.  Patient remains on carbidopa/levodopa 25/100, one tablet 3 times per day (6:30am/11:30am/5:30pm).  Overall, he thinks he has been stable.  He finished rehabilitation therapies at the end of December.  Pt denies falls.  Pt denies lightheadedness, near syncope.  No hallucinations.  Mood has been stable.  States that he has been trying to walk daily for exercise.   ALLERGIES:  No Known Allergies  CURRENT MEDICATIONS:  Outpatient Encounter Prescriptions as of 06/13/2016  Medication Sig  . aspirin EC 81 MG tablet Take 81 mg  by mouth daily.  Marland Kitchen atorvastatin (LIPITOR) 20 MG tablet Take 0.5 tablets by mouth daily.  . carbidopa-levodopa (SINEMET IR) 25-100 MG tablet Take 1 tablet by mouth 3 (three) times daily.  . cholecalciferol (VITAMIN D) 1000 units tablet Take 1,000 Units by mouth daily.  . tamsulosin (FLOMAX) 0.4 MG CAPS capsule Take 1 capsule by mouth daily.  . [DISCONTINUED] Multiple Vitamin (MULTIVITAMIN) tablet Take 1 tablet by mouth daily.  . [DISCONTINUED] Omega-3 Fatty Acids (FISH OIL) 1200 MG CAPS Take 1,200 mg by mouth every evening.   No facility-administered encounter medications on file as of 06/13/2016.     PAST MEDICAL HISTORY:   Past Medical History:  Diagnosis Date  . Arthritis   . Constipation    OCCASIONAL  . Coronary artery disease   . Diabetes mellitus without complication ([place])   . Environmental allergies   . Hyperlipidemia   . Hypertension   . Inguinal hernia   . Nocturia   . Peripheral arterial disease ([place])     PAST SURGICAL HISTORY:   Past Surgical History:  Procedure Laterality Date  . BACK SURGERY    . CARDIAC CATHETERIZATION  05/29/2006   single vessel CAD w/moderate stenosis of the prox. RCA approx 40%,prox. LAD 20-30%,minor irreg. mid abdominal aorta,left iliac artery disease 50-60%  . CATARACT EXTRACTION Right   . HERNIA REPAIR  [id number]  . INGUINAL HERNIA REPAIR N/A 09/23/2012   Procedure: LAPAROSCOPIC INGUINAL HERNIA with umbilical hernia;  Surgeon: Madilyn Hook, DO;  Location[facility];  Service: General;  Laterality: N/A;  . INSERTION OF MESH Bilateral 09/23/2012   Procedure: INSERTION OF MESH;  Surgeon: Madilyn Hook, DO;  Location: [facility];  Service: General;  Laterality: Bilateral;    SOCIAL HISTORY:   Social History   Social History  . Marital status: Married    Spouse name: N/A  . Number of children: N/A  . Years of education: N/A   Occupational History  . retired     Architect   Social History Main Topics  . Smoking status: Former Smoker    Quit  date: 04/02/1991  . Smokeless tobacco: Former Systems developer    Quit date: 08/06/1991  . Alcohol use No  . Drug use: No  . Sexual activity: Not on file   Other Topics Concern  . Not on file   Social History Narrative  . No narrative on file    FAMILY HISTORY:   Family Status  Relation Status  . Mother Deceased  . Father Deceased  . Brother Deceased  . Sister Deceased  . Sister Deceased  . Sister Alive  . Daughter Alive  . Daughter Alive  . Son Alive    ROS:  A complete 10 system review of systems was obtained and was unremarkable apart from what is mentioned above.  PHYSICAL EXAMINATION:    VITALS:   Vitals:   06/13/16 1302  BP: 120/82  Pulse: 72  Weight: 191 lb (86.6 kg)  Height: 6' (1.829 m)    GEN:  The patient appears stated age and is in NAD. HEENT:  Normocephalic, atraumatic.  The mucous membranes are moist. The superficial temporal arteries are without ropiness or tenderness. CV:  RRR Lungs:  CTAB Neck/HEME:  There are no carotid bruits bilaterally.  Neurological examination:  Orientation: The patient is alert and oriented x3. Fund of knowledge is appropriate.  Recent and remote memory are intact.  Attention and concentration are normal.    Able to name objects and repeat phrases. Cranial nerves: There is good facial symmetry.   There is significant facial hypomima with decreased blink.  Pupils are equal round and reactive to light bilaterally. Fundoscopic exam is attempted but the disc margins are not well visualized bilaterally.  Extraocular muscles are intact. The visual fields are full to confrontational testing. The speech is fluent and clear. Soft palate rises symmetrically and there is no tongue deviation. Hearing is intact to conversational tone. Sensation: Sensation is intact to light touch throughout Motor: Strength is 5/5 in the bilateral upper and lower extremities.   Shoulder shrug is equal and symmetric.  There is no pronator drift.   Movement  examination: Tone: There is normal tone bilaterally Abnormal movements: There is a LUE resting tremor that slightly increases with distraction.   Coordination:  There is only mild decremation with RAM's with finger taps and toe taps on the L and mild trouble with turning in a light bulb on the L Gait and Station: The patient has no difficulty arising out of a deep-seated chair without the use of the hands. The patient's stride length is good today and he has no trouble with turning.  ASSESSMENT/PLAN:  1.  Idiopathic Parkinson's disease.  The patient has tremor, bradykinesia, rigidity and mild postural instability.  -continue carbidopa/levodopa 25/100 tid  2.  Hyperreflexia  -MRI brain unremarkable except for small vessel disease and known R carotid occlusion.  No neck pain  3.  R carotid occlusion  -nothing further to be done in that regard  4.  Follow up  is anticipated in the next few months, sooner should new neurologic issues arise.  Much greater than 50% of this visit was spent in counseling and coordinating care.  Total face to face time:  25 min     Cc:  Horatio Pel, MD

## 2016-06-13 ENCOUNTER — Encounter: Payer: Self-pay | Admitting: Neurology

## 2016-06-13 ENCOUNTER — Ambulatory Visit (INDEPENDENT_AMBULATORY_CARE_PROVIDER_SITE_OTHER): Payer: Medicare Other | Admitting: Neurology

## 2016-06-13 VITALS — BP 120/82 | HR 72 | Ht 72.0 in | Wt 191.0 lb

## 2016-06-13 DIAGNOSIS — G20 Parkinson's disease: Secondary | ICD-10-CM

## 2016-06-13 NOTE — Patient Instructions (Signed)
You look great!  Keep exercising!  I will see you in August!

## 2016-07-02 DIAGNOSIS — X32XXXD Exposure to sunlight, subsequent encounter: Secondary | ICD-10-CM | POA: Diagnosis not present

## 2016-07-02 DIAGNOSIS — Z08 Encounter for follow-up examination after completed treatment for malignant neoplasm: Secondary | ICD-10-CM | POA: Diagnosis not present

## 2016-07-02 DIAGNOSIS — Z85828 Personal history of other malignant neoplasm of skin: Secondary | ICD-10-CM | POA: Diagnosis not present

## 2016-07-02 DIAGNOSIS — L570 Actinic keratosis: Secondary | ICD-10-CM | POA: Diagnosis not present

## 2016-07-25 DIAGNOSIS — M79671 Pain in right foot: Secondary | ICD-10-CM | POA: Diagnosis not present

## 2016-07-25 DIAGNOSIS — B351 Tinea unguium: Secondary | ICD-10-CM | POA: Diagnosis not present

## 2016-07-25 DIAGNOSIS — E1351 Other specified diabetes mellitus with diabetic peripheral angiopathy without gangrene: Secondary | ICD-10-CM | POA: Diagnosis not present

## 2016-08-01 DIAGNOSIS — I779 Disorder of arteries and arterioles, unspecified: Secondary | ICD-10-CM | POA: Diagnosis not present

## 2016-08-01 DIAGNOSIS — Z7982 Long term (current) use of aspirin: Secondary | ICD-10-CM | POA: Diagnosis not present

## 2016-08-01 DIAGNOSIS — I10 Essential (primary) hypertension: Secondary | ICD-10-CM | POA: Diagnosis not present

## 2016-08-01 DIAGNOSIS — M545 Low back pain: Secondary | ICD-10-CM | POA: Diagnosis not present

## 2016-08-28 ENCOUNTER — Other Ambulatory Visit: Payer: Self-pay | Admitting: Neurology

## 2016-08-28 DIAGNOSIS — G20 Parkinson's disease: Secondary | ICD-10-CM

## 2016-09-16 DIAGNOSIS — L853 Xerosis cutis: Secondary | ICD-10-CM | POA: Diagnosis not present

## 2016-09-16 DIAGNOSIS — E118 Type 2 diabetes mellitus with unspecified complications: Secondary | ICD-10-CM | POA: Diagnosis not present

## 2016-09-18 DIAGNOSIS — E118 Type 2 diabetes mellitus with unspecified complications: Secondary | ICD-10-CM | POA: Diagnosis not present

## 2016-09-18 DIAGNOSIS — I779 Disorder of arteries and arterioles, unspecified: Secondary | ICD-10-CM | POA: Diagnosis not present

## 2016-09-18 DIAGNOSIS — I10 Essential (primary) hypertension: Secondary | ICD-10-CM | POA: Diagnosis not present

## 2016-09-18 DIAGNOSIS — E559 Vitamin D deficiency, unspecified: Secondary | ICD-10-CM | POA: Diagnosis not present

## 2016-10-22 ENCOUNTER — Ambulatory Visit: Payer: Medicare Other | Admitting: Physical Therapy

## 2016-10-22 ENCOUNTER — Ambulatory Visit: Payer: Medicare Other | Admitting: Speech Pathology

## 2016-10-22 ENCOUNTER — Ambulatory Visit: Payer: Medicare Other | Attending: Internal Medicine | Admitting: Occupational Therapy

## 2016-10-22 DIAGNOSIS — R2689 Other abnormalities of gait and mobility: Secondary | ICD-10-CM

## 2016-10-22 DIAGNOSIS — R471 Dysarthria and anarthria: Secondary | ICD-10-CM

## 2016-10-22 DIAGNOSIS — R278 Other lack of coordination: Secondary | ICD-10-CM | POA: Insufficient documentation

## 2016-10-22 DIAGNOSIS — R29818 Other symptoms and signs involving the nervous system: Secondary | ICD-10-CM | POA: Insufficient documentation

## 2016-10-22 NOTE — Therapy (Signed)
[facility] [address] [place], [place], [address] Phone: [phone]   Fax:  [phone]  Patient Details  Name: Rodney Love MRN: [id number] Date of Birth: 1945-07-17 Referring Provider:  [name], MD  Encounter Date: 10/22/2016  Speech Therapy Parkinson's Disease Screen   Decibel Level today: 68dB  (WNL=70-72 dB) with sound level meter 30cm away from pt's mouth. Pt's conversational volume has decreased since last treatment course. Pt and his daughter report variable speech volume at home and would like to follow up with ST eval.  Pt has not experienced difficulty in swallowing warranting objective evaluation  Pt would benefit from speech-language eval for dysarthria  - please order via EPIC or call [phone] to schedule      Gamal Todisco, [place], [place] 10/22/2016, 2:03 PM  [facility] [address] [place], [place], [address] Phone: [phone]   Fax:  [phone]

## 2016-10-22 NOTE — Therapy (Signed)
[facility] [address] [place], [place], [address] Phone: [phone]   Fax:  [phone]  Patient Details  Name: Rodney Love MRN: [id number] Date of Birth: [birth year] Referring Provider:  [name], MD  Encounter Date: 10/22/2016  Physical Therapy Parkinson's Disease Screen   Timed Up and Go test: 13.34 sec (compared to 10.3 seconds)  10 meter walk test:10.71 sec (3.06 ft/sec)-compared to 3.93 ft/sec  5 time sit to stand test: 12.97 sec-compared to 11.5 sec  Patient would benefit from Physical Therapy evaluation due to slowed mobility measures since discharge in December 2017.         Aidee Latimore W. 10/22/2016, 1:37 PM  Frazier Butt., PT   [facility] [address] [place], [place], [address] Phone: [phone]   Fax:  [phone]

## 2016-10-22 NOTE — Therapy (Signed)
[facility] [address] [place], [place], [address] Phone: [phone]   Fax:  [phone]  Patient Details  Name: Rodney Love MRN: [id number] Date of Birth: [birth year] Referring Provider:  [name], MD  Encounter Date: 10/22/2016   Occupational Therapy Parkinson's Disease Screen  Hand dominance:  right   Physical Performance Test item #4 (donning/doffing jacket):  16.37 sec (no trunk rotation)  Fastening/unfastening 3 buttons in:  19.22sec when wearing shirt (incr time on tabletop)  9-hole peg test:    RUE  25.0 sec        LUE  44.47 sec   Change in ability to perform ADLs/IADLs:  Denies L shoulder pain (L shoulder ROM appears grossly WNL), difficulty reaching to tie shoes/donning shoes and socks  Other Comments:  Pt reports that he has been doing some of his HEP/walking, but feels that meds wear off before they should.  Backs hurting more today.    Pt would benefit from occupational therapy evaluation due to  incr difficulty with ADLs, decr L hand coordination.   [facility] 10/22/2016, 1:15 PM  [facility] [address] [place], [place], [address] Phone: [phone]   Fax:  [place], OTR/L [facility] [address] [place], [place]  [address] [phone] phone [phone] 10/22/16 3:40 PM

## 2016-10-24 ENCOUNTER — Telehealth: Payer: Self-pay | Admitting: Occupational Therapy

## 2016-10-24 DIAGNOSIS — G20 Parkinson's disease: Secondary | ICD-10-CM

## 2016-10-24 NOTE — Telephone Encounter (Signed)
Dr. Carles Collet,   Mr. Hayter was seen for Parkinsons OT, PT, and ST screens 10/22/16.   Pt may benefit from OT referral (due to decr coordination/incr difficulty with ADLs), speech therapy referral (due to dysarthria ), and PT referral (due to slowed mobility measures).   If you are in agreement, please send updated order for Speech therapy, Occupational therapy, and Physical therapy via epic.  Thank you,   Vianne Bulls, OTR/L [facility] [address] [place], [place]  [address] [phone] phone [phone] 10/24/16 4:39 PM

## 2016-10-25 NOTE — Telephone Encounter (Signed)
Order already entered.

## 2016-10-25 NOTE — Telephone Encounter (Signed)
Rodney Love, would you place this order for speech therapy, occupational therapy and physical therapy?  Thanks

## 2016-11-12 NOTE — Progress Notes (Addendum)
Rodney Love was seen today in the movement disorders clinic for neurologic consultation at the request of [name], MD.  The consultation is for the evaluation of tremor.  The records that were made available to me were reviewed. This patient is accompanied in the office by his daughter who supplements the history.  The records that were made available to me were reviewed.  Cardiology notes do indicate resting tremor.  Cardiology thought that it was likely ET and propranolol was started on 11/22/15.  He was given 10 mg bid but states that he couldn't handle that due to lightheaded/dizzy and went to 5 mg bid.  He thinks that it helps some.   02/13/16 update:  Pt was seen in f/u, accompanied by his daughter who supplements the history.  Started on carbidopa/levodopa 25/100 tid last visit after being seen and dx with PD.  If he doesn't take it on time, he will get "jittery."  His propranolol was d/c by his cardiologist.  He had an MRI brain of the brain on 12/25/15 demonstrating scattered, mild-mod T2 hyperintensities, slighly more at grey-white jxn. It also demonstrated a known, right carotid occlusion, which cardiology has been following with carotid u/s, last done in March, 2017.  He is still attending PD therapies at the neurorehab center and those notes were reviewed.  06/13/16 update:  Patient follows up today, accompanied by his daughter who supplements the history.  Patient remains on carbidopa/levodopa 25/100, one tablet 3 times per day (6:30am/11:30am/5:30pm).  Overall, he thinks he has been stable.  He finished rehabilitation therapies at the end of December.  Pt denies falls.  Pt denies lightheadedness, near syncope.  No hallucinations.  Mood has been stable.  States that he has been trying to walk daily for exercise.  11/14/16 update:  Patient seen today in follow-up for his Parkinson's disease, accompanied by his daughter who supplements the history.  Patient is on carbidopa/levodopa  25/100, one tablet 3 times per day (6:30am/11:30am/5:30pm).  He notes that at 3-4am, he will awaken tremulous.    Pt denies falls.  Pt denies lightheadedness, near syncope.  No hallucinations.  Mood has been good.  Seen at rehab and order requested at end July for ST/OT/PT   ALLERGIES:  No Known Allergies  CURRENT MEDICATIONS:  Outpatient Encounter Prescriptions as of 11/14/2016  Medication Sig  . aspirin EC 81 MG tablet Take 81 mg by mouth daily.  Marland Kitchen atorvastatin (LIPITOR) 20 MG tablet Take 0.5 tablets by mouth daily.  . carbidopa-levodopa (SINEMET IR) 25-100 MG tablet TAKE ONE TABLET BY MOUTH THREE TIMES A DAY  . cholecalciferol (VITAMIN D) 1000 units tablet Take 1,000 Units by mouth daily.  . tamsulosin (FLOMAX) 0.4 MG CAPS capsule Take 1 capsule by mouth daily.   No facility-administered encounter medications on file as of 11/14/2016.     PAST MEDICAL HISTORY:   Past Medical History:  Diagnosis Date  . Arthritis   . Constipation    OCCASIONAL  . Coronary artery disease   . Diabetes mellitus without complication ([place])   . Environmental allergies   . Hyperlipidemia   . Hypertension   . Inguinal hernia   . Nocturia   . Peripheral arterial disease ([place])     PAST SURGICAL HISTORY:   Past Surgical History:  Procedure Laterality Date  . BACK SURGERY    . CARDIAC CATHETERIZATION  05/29/2006   single vessel CAD w/moderate stenosis of the prox. RCA approx 40%,prox. LAD 20-30%,minor irreg. mid abdominal  aorta,left iliac artery disease 50-60%  . CATARACT EXTRACTION Right   . HERNIA REPAIR  [id number]  . INGUINAL HERNIA REPAIR N/A 09/23/2012   Procedure: LAPAROSCOPIC INGUINAL HERNIA with umbilical hernia;  Surgeon: Madilyn Hook, DO;  Location: [facility];  Service: General;  Laterality: N/A;  . INSERTION OF MESH Bilateral 09/23/2012   Procedure: INSERTION OF MESH;  Surgeon: Madilyn Hook, DO;  Location: [facility];  Service: General;  Laterality: Bilateral;    SOCIAL HISTORY:   Social History     Social History  . Marital status: Married    Spouse name: N/A  . Number of children: N/A  . Years of education: N/A   Occupational History  . retired     Architect   Social History Main Topics  . Smoking status: Former Smoker    Quit date: 04/02/1991  . Smokeless tobacco: Former Systems developer    Quit date: 08/06/1991  . Alcohol use No  . Drug use: No  . Sexual activity: Not on file   Other Topics Concern  . Not on file   Social History Narrative  . No narrative on file    FAMILY HISTORY:   Family Status  Relation Status  . Mother Deceased  . Father Deceased  . Brother Deceased  . Sister Deceased  . Sister Deceased  . Sister Alive  . Daughter Alive  . Daughter Alive  . Son Alive    ROS:  A complete 10 system review of systems was obtained and was unremarkable apart from what is mentioned above.  PHYSICAL EXAMINATION:    VITALS:   There were no vitals filed for this visit.  GEN:  The patient appears stated age and is in NAD. HEENT:  Normocephalic, atraumatic.  The mucous membranes are moist. The superficial temporal arteries are without ropiness or tenderness. CV:  RRR Lungs:  CTAB Neck/HEME:  There are no carotid bruits bilaterally.  Neurological examination:  Orientation: The patient is alert and oriented x3. Fund of knowledge is appropriate.  Recent and remote memory are intact.  Attention and concentration are normal.    Able to name objects and repeat phrases. Cranial nerves: There is good facial symmetry.   There is significant facial hypomima with decreased blink.  Pupils are equal round and reactive to light bilaterally. Fundoscopic exam is attempted but the disc margins are not well visualized bilaterally.  Extraocular muscles are intact. The visual fields are full to confrontational testing. The speech is fluent and clear. Soft palate rises symmetrically and there is no tongue deviation. Hearing is intact to conversational tone. Sensation: Sensation is  intact to light touch throughout Motor: Strength is 5/5 in the bilateral upper and lower extremities.   Shoulder shrug is equal and symmetric.  There is no pronator drift.   Movement examination: Tone: There is normal tone bilaterally Abnormal movements: There is a LUE resting tremor that slightly increases with distraction.   Coordination:  There is good RAMs today, with any form of RAMS, including alternating supination and pronation of the forearm, hand opening and closing, finger taps, heel taps and toe taps. Gait and Station: The patient has no difficulty arising out of a deep-seated chair without the use of the hands. The patient's stride length is good today and he has no trouble with turning.  Labs:   Lab work was received from his primary care physician dated March 26, 2017.  White blood cells were 5.2, hemoglobin 14.4, hematocrit 44.1 and platelets 164.  Sodium was 141,  potassium 4.2, chloride 104, CO2 22, BUN 11, creatinine 0.99, AST 28, ALT 16, hemoglobin A1c 6.4 (6.0 in June, 2018)  ASSESSMENT/PLAN:  1.  Idiopathic Parkinson's disease.  The patient has tremor, bradykinesia, rigidity and mild postural instability.  -continue carbidopa/levodopa 25/100 tid  -add carbidopa/levodopa 50/200 cr q hs.    2.  Hyperreflexia  -MRI brain unremarkable except for small vessel disease and known R carotid occlusion.  No neck pain  3.  R carotid occlusion  -nothing further to be done in that regard  4.  Follow up is anticipated in the next few months, sooner should new neurologic issues arise.  Much greater than 50% of this visit was spent in counseling and coordinating care.  Total face to face time:  25 min       Cc:  [name], MD

## 2016-11-14 ENCOUNTER — Ambulatory Visit (INDEPENDENT_AMBULATORY_CARE_PROVIDER_SITE_OTHER): Payer: Medicare Other | Admitting: Neurology

## 2016-11-14 ENCOUNTER — Encounter: Payer: Self-pay | Admitting: Neurology

## 2016-11-14 VITALS — BP 120/78 | HR 64 | Ht 72.0 in | Wt 196.0 lb

## 2016-11-14 DIAGNOSIS — G20 Parkinson's disease: Secondary | ICD-10-CM

## 2016-11-14 MED ORDER — CARBIDOPA-LEVODOPA ER 50-200 MG PO TBCR: 1.0000 | EXTENDED_RELEASE_TABLET | Freq: Every day | ORAL | 1 refills | Status: DC

## 2016-11-14 NOTE — Patient Instructions (Signed)
1.  Continue carbidopa/levodopa 25/100 three times a day 2.  Start carbidopa/levodopa 50/200 CR (this is an extended release tablet) at bedtime

## 2016-11-19 DIAGNOSIS — H2512 Age-related nuclear cataract, left eye: Secondary | ICD-10-CM | POA: Diagnosis not present

## 2016-11-20 ENCOUNTER — Ambulatory Visit: Payer: Medicare Other | Admitting: Physical Therapy

## 2016-11-20 ENCOUNTER — Ambulatory Visit: Payer: Medicare Other | Admitting: Occupational Therapy

## 2016-11-26 ENCOUNTER — Other Ambulatory Visit: Payer: Self-pay | Admitting: Neurology

## 2016-12-05 ENCOUNTER — Ambulatory Visit: Payer: Medicare Other | Attending: Internal Medicine | Admitting: Occupational Therapy

## 2016-12-05 ENCOUNTER — Ambulatory Visit: Payer: Medicare Other | Admitting: Physical Therapy

## 2016-12-05 ENCOUNTER — Ambulatory Visit: Payer: Medicare Other

## 2016-12-05 DIAGNOSIS — R29898 Other symptoms and signs involving the musculoskeletal system: Secondary | ICD-10-CM

## 2016-12-05 DIAGNOSIS — R278 Other lack of coordination: Secondary | ICD-10-CM | POA: Insufficient documentation

## 2016-12-05 DIAGNOSIS — R2689 Other abnormalities of gait and mobility: Secondary | ICD-10-CM

## 2016-12-05 DIAGNOSIS — R2681 Unsteadiness on feet: Secondary | ICD-10-CM | POA: Insufficient documentation

## 2016-12-05 DIAGNOSIS — R29818 Other symptoms and signs involving the nervous system: Secondary | ICD-10-CM | POA: Diagnosis not present

## 2016-12-05 DIAGNOSIS — R293 Abnormal posture: Secondary | ICD-10-CM | POA: Diagnosis not present

## 2016-12-05 DIAGNOSIS — R471 Dysarthria and anarthria: Secondary | ICD-10-CM

## 2016-12-05 DIAGNOSIS — M25612 Stiffness of left shoulder, not elsewhere classified: Secondary | ICD-10-CM | POA: Diagnosis not present

## 2016-12-05 NOTE — Therapy (Signed)
[facility] [address] [place], [place], [address] Phone: [phone]   Fax:  [phone]  Speech Language Pathology Evaluation  Patient Details  Name: Rodney Love MRN: [id number] Date of Birth: [birth year] Referring Provider: Alonza Bogus, D.O.  Encounter Date: 12/05/2016      End of Session - 12/05/16 1625    Visit Number 1   Number of Visits 17   Date for SLP Re-Evaluation 02/14/17   SLP Start Time 5400   SLP Stop Time  8676   SLP Time Calculation (min) 38 min   Activity Tolerance Patient tolerated treatment well      Past Medical History:  Diagnosis Date  . Arthritis   . Constipation    OCCASIONAL  . Coronary artery disease   . Diabetes mellitus without complication ([place])   . Environmental allergies   . Hyperlipidemia   . Hypertension   . Inguinal hernia   . Nocturia   . Peripheral arterial disease [facility])     Past Surgical History:  Procedure Laterality Date  . BACK SURGERY    . CARDIAC CATHETERIZATION  05/29/2006   single vessel CAD w/moderate stenosis of the prox. RCA approx 40%,prox. LAD 20-30%,minor irreg. mid abdominal aorta,left iliac artery disease 50-60%  . CATARACT EXTRACTION Right   . HERNIA REPAIR  [id number]  . INGUINAL HERNIA REPAIR N/A 09/23/2012   Procedure: LAPAROSCOPIC INGUINAL HERNIA with umbilical hernia;  Surgeon: Madilyn Hook, DO;  Location: [facility];  Service: General;  Laterality: N/A;  . INSERTION OF MESH Bilateral 09/23/2012   Procedure: INSERTION OF MESH;  Surgeon: Madilyn Hook, DO;  Location: [facility];  Service: General;  Laterality: Bilateral;    There were no vitals filed for this visit.      Subjective Assessment - 12/05/16 1543    Subjective Pt again endorses softer voice than at d/c. "My wife says she can't hear me sometimes."   Patient is accompained by: --  daughter [name]   Currently in Pain? No/denies            SLP Evaluation [facility] - 12/05/16 1543      SLP Visit Information   SLP Received On 12/05/16   Referring Provider Tat, Wells Guiles, D.O.   Medical Diagnosis Parkinson's Disease     General Information   HPI Pt with course of ST in late 2017 - was d/c'd with mostly WNL volumes, returned for screen in laste July found to be lower than WNL in conversation, volume variable. Another eval for ST was recommended.      Prior Functional Status   Cognitive/Linguistic Baseline Within functional limits   Vocation Retired     Charity fundraiser Status Within Functional Limits for tasks assessed     Auditory Comprehension   Overall Auditory Comprehension Appears within functional limits for tasks assessed     Verbal Expression   Overall Verbal Expression Appears within functional limits for tasks assessed     Oral Motor/Sensory Function   Overall Oral Motor/Sensory Function Impaired   Labial ROM Within Functional Limits   Labial Symmetry Within Functional Limits   Labial Strength Within Functional Limits   Labial Coordination Reduced   Lingual Symmetry Within Functional Limits   Lingual Strength Within Functional Limits   Lingual Coordination Reduced   Velum Within Functional Limits     Motor Speech   Overall Motor Speech Impaired   Phonation Low vocal intensity   Resonance Within functional limits   Articulation Within  functional limitis   Intelligibility Intelligible   Phonation --  68dB 5 minutesconversation; after /a/-71dB 3 minutes     As above, measured when a sound level meter was placed 30 cm away from pt's mouth, 5 minutes of simple conversational speech was reduced today, at average in the upper 60s dB (WNL= average 70-72dB) with range of approx 63-71dB. He demonstrated reduced breath support for speech during conversation by using chest/clavicular breathing. Overall speech intelligibility for this listener in a quiet environment was not affected, at approx 100%, however SLP believes that in a noisy environment pt  would likely not have 100% intelligibility. Production of loud /a/ needed to be shaped, as pt demo'd strained/strangeld voice quality, but once WNL vocal quality was achieved by SLP using "Hey! Ahhhh" he averaged in the low 90sdB (range of 87 to 93dB) and rare min cues needed for loudness.  Pt was asked to use the same amount of effort as with loud /a/ in 3 minutes of simple conversation. Loudness average with this increased effort was in the low 70s dB (range of approx 66 to 74dB) without cues for loudness. Pt would benefit from skilled ST in order to improve speech intelligibility and pt's QOL.                     SLP Education - 12/05/16 1625    Education provided Yes   Education Details eval results for ST, loud /a/ shaping - "Hey - Ahhhh"   Person(s) Educated Patient;Child(ren)   Methods Explanation;Demonstration;Verbal cues;Handout   Comprehension Verbalized understanding;Returned demonstration;Need further instruction          SLP Short Term Goals - 12/05/16 1629      SLP SHORT TERM GOAL #1   Title Pt will average 90dB on loud /a/ with rare min A over 5 sessions   Time 4   Period Weeks   Status New     SLP SHORT TERM GOAL #2   Title Pt will average in low 70s dB on 90% of sentence level structured speech tasks with rare min A, over 3 sessions   Time 4   Period Weeks   Status New     SLP SHORT TERM GOAL #3   Title Pt will maintain average of 70dB during 8 minute simple conversation with rare min A over three sessions   Time 4   Period Weeks   Status New     SLP SHORT TERM GOAL #4   Title pt will demo knowledge of overt s/s dysphagia with meals with modified independence   Time 4   Period Weeks   Status New          SLP Long Term Goals - 12/05/16 1631      SLP LONG TERM GOAL #1   Title Pt will maintain average of 70dB during 15 minute moderately complex conversation with rare min A over two sessions   Time 8   Period Weeks   Status New     SLP  LONG TERM GOAL #2   Title Pt. will maintain audible conversation over 12 minutes in noisy environment outside of therapy room over two sessions   Time 8   Period Weeks   Status New          Plan - 12/05/16 1626    Clinical Impression Statement Pt presents today with dysarthria c/b reduced speech volume (upper 60s dB average in 5 minutes simple conversation) and complicated by reduced breath support. After  loud /a/ shaping by SLP at average lower 90sdB, pt maintained consistent volume for 3 minutes simple conversation with lower 70s dB average. Pt is excellent candidate for a course of ST focusing on speech loudness and breath support in conversation, across speaking situations. Pt denies overt s/s aspiration however SLP will monitor pt's swallow function. Pt is in agreement with this plan.   Speech Therapy Frequency 2x / week   Duration --  8 weeks   Treatment/Interventions Compensatory strategies;Patient/family education;Functional tasks;Cueing hierarchy;Compensatory techniques;Internal/external aids;SLP instruction and feedback   Potential to Achieve Goals Good      Patient will benefit from skilled therapeutic intervention in order to improve the following deficits and impairments:   Dysarthria and anarthria      G-Codes - 12/24/2016 1638    Functional Assessment Tool Used noms, clinical judgment   Functional Limitations Motor speech   Motor Speech Current Status [phone]) At least 20 percent but less than 40 percent impaired, limited or restricted   Motor Speech Goal Status ([id number]) At least 1 percent but less than 20 percent impaired, limited or restricted      Problem List Patient Active Problem List   Diagnosis Date Noted  . PD (Parkinson's disease) ([place]) 12/18/2015  . Tremor of both hands 11/22/2015  . PAD (peripheral artery disease) s/p remote left external iliac artery angioplasty 10/28/2012  . Coronary atherosclerosis 10/28/2012  . Hyperlipidemia 10/28/2012  . HTN  (hypertension) 10/28/2012  . DM2 (diabetes mellitus, type 2) ([place]) 10/28/2012  . Carotid occlusion, right 10/28/2012    [facility] ,MS, CCC-SLP  Dec 24, 2016, 4:39 PM  [facility] [address] [place], [place], [address] Phone: [phone]   Fax:  [phone]  Name: Rodney Love MRN: [id number] Date of Birth: 1945/10/18

## 2016-12-05 NOTE — Therapy (Signed)
[facility] [address] [place], [place], [address] Phone: [phone]   Fax:  [phone]  Occupational Therapy Evaluation  Patient Details  Name: Rodney Love MRN: [id number] Date of Birth: 05-24-1945 Referring Provider: Dr. Wells Guiles Tat   Encounter Date: 12/05/2016      OT End of Session - 12/05/16 1611    Visit Number 1   Number of Visits 17   Date for OT Re-Evaluation 02/03/17   Authorization Type [employer] Medicare, no visit limit/no auth; G-code needed   Authorization - Visit Number 1   Authorization - Number of Visits 10   OT Start Time 0354   OT Stop Time 1535   OT Time Calculation (min) 41 min   Activity Tolerance Patient tolerated treatment well   Behavior During Therapy [facility] for tasks assessed/performed      Past Medical History:  Diagnosis Date  . Arthritis   . Constipation    OCCASIONAL  . Coronary artery disease   . Diabetes mellitus without complication ([place])   . Environmental allergies   . Hyperlipidemia   . Hypertension   . Inguinal hernia   . Nocturia   . Peripheral arterial disease [facility])     Past Surgical History:  Procedure Laterality Date  . BACK SURGERY    . CARDIAC CATHETERIZATION  05/29/2006   single vessel CAD w/moderate stenosis of the prox. RCA approx 40%,prox. LAD 20-30%,minor irreg. mid abdominal aorta,left iliac artery disease 50-60%  . CATARACT EXTRACTION Right   . HERNIA REPAIR  [id number]  . INGUINAL HERNIA REPAIR N/A 09/23/2012   Procedure: LAPAROSCOPIC INGUINAL HERNIA with umbilical hernia;  Surgeon: Madilyn Hook, DO;  Location: [facility];  Service: General;  Laterality: N/A;  . INSERTION OF MESH Bilateral 09/23/2012   Procedure: INSERTION OF MESH;  Surgeon: Madilyn Hook, DO;  Location: [facility];  Service: General;  Laterality: Bilateral;    There were no vitals filed for this visit.      Subjective Assessment - 12/05/16 1459    Subjective  I feel like my arms are shorter,  or my legs are longer   Patient is accompained by: Family member  daughter   Pertinent History Parkinson's disease; PAD, HTN, DM, hyperlipedemia, hx of low back pain   Limitations fall risk   Patient Stated Goals improve buttoning, donning shoes/socks   Currently in Pain? No/denies  pt reports low back pain/stiffness first thing in the morning           [facility] OT Assessment - 12/05/16 1501      Assessment   Diagnosis Parkinson's disease   Referring Provider Dr. Wells Guiles Tat    Prior Therapy OT, PT, ST 03/2016     Precautions   Precautions Fall  avoid lifting >20lbs   Precaution Comments hx of low back pain     Balance Screen   Has the patient fallen in the past 6 months Yes   How many times? 1  slipped on wet spot while washing car     Home  Environment   Family/patient expects to be discharged to: Private residence   Home Access --  1 6" step   [place] One level   Lives With [place] Retired   Leisure Enjoys working around [employer] and yard, enjoys walking in the park (hasn't been able to do that due to wife being sick); enjoys fishing  ADL   Eating/Feeding Modified independent   Grooming --  min difficulty shaving neck   Upper Body Bathing --  mod I   Lower Body Bathing --  sits on edge of tub (seat didn't fit in tub well)   Upper Body Dressing --  min difficulty with button, difficulty pulling down shirt in   Lower Body Dressing --  difficulty tieing shoes/donning socks (needs assist), fasten   Toilet Transfer Modified independent   Toileting - Clothing Manipulation Modified independent   Toileting -  Hygiene Modified Independent   Tub/Shower Transfer Modified independent   Warden/ranger Grab bars  tub/shower combo     IADL   Prior Level of Function Shopping goes with wife   Prior Level of Function Light Housekeeping shares with wife, difficulty squatting, performs  light tasks, does yardwork (Chiropractor, pick up limbs, trims hedges, raking--however back hurts after).  Pt reports intermittent difficulty with balance when carrying things.   Prior Level of Function Meal Prep wife performs, pt performs snack prep   Community Mobility Drives own vehicle   Medication Management Is responsible for taking medication in correct dosages at correct time   Prior Level of Function Financial Management performs with wife     Mobility   Mobility Status History of falls  ambulates without device   Mobility Status Comments possible freezing/difficulty turning     Written Expression   Dominant Hand Right   Handwriting 100% legible  no decr in size      Vision - History   Additional Comments grossly WFL, reports L cataract surgery expected soon     Cognition   Overall Cognitive Status Impaired/Different from baseline   Area of Impairment Awareness   Awareness Comments decr awareness of PD-related deficits   Awareness --     Observation/Other Assessments   Observations rounded shoulders   Standing Functional Reach Test R-9", L-9.5"   Other Surveys  Select   Physical Performance Test   Yes   Simulated Eating Time (seconds) 12.44   Donning Doffing Jacket Time (seconds) 15.81sec   Donning Doffing Jacket Comments Fastening/unfastening 3 buttons on table in 73min 6 sec     Coordination   9 Hole Peg Test Right;Left   Right 9 Hole Peg Test 25.45   Left 9 Hole Peg Test 43.91   Box and Blocks R-47blocks, L-41blocks     Tone   Assessment Location Right Upper Extremity;Left Upper Extremity     AROM   Overall AROM  Deficits   Overall AROM Comments approx 90% L shoulder AROM (R shoulder flex 120*, L 115* and decr supination and ER)     RUE Tone   RUE Tone --  minimal rigidity     LUE Tone   LUE Tone Moderate                           OT Short Term Goals - 12/05/16 1623      OT SHORT TERM GOAL #1   Title Pt will be independent with  updated HEP.--check 01/02/17   Time 4   Period Weeks   Status New     OT SHORT TERM GOAL #2   Title Pt will improve L hand coordination for ADLs as shown by improving time on 9-hole peg test by at least 5sec.   Baseline L-43.91sec   Time 4   Period Weeks   Status New     OT SHORT  TERM GOAL #3   Title Pt will be able to don shoes/sock mod I.   Baseline needs assist   Time 4   Period Weeks   Status New     OT SHORT TERM GOAL #4   Title Pt will verbalize understanding of appropriate community resources and ways to prevent future complications related to PD.   Baseline -----   Time 4   Period Weeks   Status New           OT Long Term Goals - 12/05/16 1633      OT LONG TERM GOAL #1   Title Pt will verbalize understanding of adaptive strategies for ADLs/IADLs to incr ease, incr safety, and decr risk of pain.--check LTGs 02/03/17   Time 8   Period Weeks   Status New     OT LONG TERM GOAL #2   Title Pt will improve bilateral functional reaching/coordination as shown by improving score on box and blocks test by at least 3 with RUE and at least 5 with LUE>   Baseline R-47 blocks, L-41 blocks   Time 8   Period Weeks   Status New     OT LONG TERM GOAL #3   Title Pt will be able to fasten/unfasten 3 buttons in less than 60sec.   Baseline ---   Time 8   Period Weeks   Status New     OT LONG TERM GOAL #4   Title Pt will improve balance/functional reaching for ADLs/IADLs as shown by improving standing functional reach to 10 inches or greater bilaterally   Baseline R-9", L-9.5"   Time 8   Period Weeks   Status New     OT LONG TERM GOAL #5   Title Pt will improve L hand coordination for ADLs as shown by improving time on 9-hole peg test by at least 10sec.   Baseline 43.91sec   Time 8   Period Weeks   Status New     Long Term Additional Goals   Additional Long Term Goals --               Plan - 12/05/16 1612    Clinical Impression Statement Pt presents today  with bradykinesia, rigidity, decr posture, decr coordination, decr ROM, decr balance/functional mobility.  Pt would benefit from occupational therapy to incr ease and safety with ADLs/IADLs, prevent future complications, and improve quality of life.   Occupational Profile and client history currently impacting functional performance Pt is a 71 y.o. male diagnosed with Parkinson's disease.  Pt with PMH that includes:  PAD, HTN, DM, hyperlipedemia, and hx of low back pain.  Pt lives with his wife and enjoys doing household chores, yardwork, and walking in the park.  Deficits are currently affecting ADLs/IADLs ease and safety.   Occupational performance deficits (Please refer to evaluation for details): ADL's;IADL's;Leisure   Rehab Potential Good   OT Frequency 2x / week   OT Duration 8 weeks   OT Treatment/Interventions Self-care/ADL training;Cryotherapy;Therapeutic exercise;DME and/or AE instruction;Therapist, nutritional;Therapeutic activities;Patient/family education;Balance training;Cognitive remediation/compensation;Manual Therapy;Neuromuscular education;Moist Heat;Energy conservation;Passive range of motion;Therapeutic exercises   Plan supine/sitting PWR! moves review   Clinical Decision Making Several treatment options, min-mod task modification necessary   Consulted and Agree with Plan of Care Patient;Family member/caregiver   Family Member Consulted daughter      Patient will benefit from skilled therapeutic intervention in order to improve the following deficits and impairments:  Decreased balance, Decreased mobility, Decreased activity tolerance, Decreased coordination, Decreased  knowledge of precautions, Decreased safety awareness, Impaired tone, Improper body mechanics, Impaired UE functional use, Decreased range of motion, Decreased knowledge of use of DME (bradykinesia)  Visit Diagnosis: Other symptoms and signs involving the nervous system  Other symptoms and signs involving  the musculoskeletal system  Other lack of coordination  Stiffness of left shoulder, not elsewhere classified  Abnormal posture  Unsteadiness on feet  Other abnormalities of gait and mobility      G-Codes - Dec 23, 2016 1646    Functional Assessment Tool Used (Outpatient only) fastening/unfastening 3 buttons in >48min, Standing functional reach:  R-9", L-9.5", needs assist for tying shoes/donning socks   Functional Limitation Self care   Self Care Current Status (V7482) At least 20 percent but less than 40 percent impaired, limited or restricted   Self Care Goal Status (L0786) At least 1 percent but less than 20 percent impaired, limited or restricted      Problem List Patient Active Problem List   Diagnosis Date Noted  . PD (Parkinson's disease) ([place]) 12/18/2015  . Tremor of both hands 11/22/2015  . PAD (peripheral artery disease) s/p remote left external iliac artery angioplasty 10/28/2012  . Coronary atherosclerosis 10/28/2012  . Hyperlipidemia 10/28/2012  . HTN (hypertension) 10/28/2012  . DM2 (diabetes mellitus, type 2) ([place]) 10/28/2012  . Carotid occlusion, right 10/28/2012    [facility] 2016/12/23, 4:48 PM  [facility] [address] [place], [place], [address] Phone: [phone]   Fax:  [phone]  Name: Rodney Love MRN: [id number] Date of Birth: [birth year]   Vianne Bulls, OTR/L [facility] [address] [place], [place]  [address] [phone] phone [phone] Dec 23, 2016 4:48 PM

## 2016-12-05 NOTE — Patient Instructions (Signed)
   Do 5 loud "HEY! Ahhhhhhhhhhhhhhh" twice per day   - feel the loudness from your gut  Read out loud for 2 minutes, twice a day  - again, feel the loudness from your gut

## 2016-12-06 DIAGNOSIS — R2689 Other abnormalities of gait and mobility: Secondary | ICD-10-CM | POA: Diagnosis not present

## 2016-12-06 DIAGNOSIS — M25612 Stiffness of left shoulder, not elsewhere classified: Secondary | ICD-10-CM | POA: Diagnosis not present

## 2016-12-06 DIAGNOSIS — R293 Abnormal posture: Secondary | ICD-10-CM | POA: Diagnosis not present

## 2016-12-06 DIAGNOSIS — R29818 Other symptoms and signs involving the nervous system: Secondary | ICD-10-CM | POA: Diagnosis not present

## 2016-12-06 DIAGNOSIS — R278 Other lack of coordination: Secondary | ICD-10-CM | POA: Diagnosis not present

## 2016-12-06 DIAGNOSIS — R2681 Unsteadiness on feet: Secondary | ICD-10-CM | POA: Diagnosis not present

## 2016-12-06 DIAGNOSIS — R471 Dysarthria and anarthria: Secondary | ICD-10-CM | POA: Diagnosis not present

## 2016-12-06 DIAGNOSIS — R29898 Other symptoms and signs involving the musculoskeletal system: Secondary | ICD-10-CM | POA: Diagnosis not present

## 2016-12-06 NOTE — Therapy (Signed)
[place] [address] [place], [place], [address] Phone: [phone]   Fax:  [phone]  Physical Therapy Evaluation  Patient Details  Name: Rodney Love MRN: [id number] Date of Birth: 1946-02-26 Referring Provider: Alonza Bogus  Encounter Date: 12/05/2016      PT End of Session - 12/06/16 1253    Visit Number 1   Number of Visits 13   Date for PT Re-Evaluation 02/03/17   Authorization Type [employer] Medicare-GCODE every 10th visit   PT Start Time 1319   PT Stop Time 1402   PT Time Calculation (min) 43 min   Activity Tolerance Patient tolerated treatment well   Behavior During Therapy [facility] for tasks assessed/performed      Past Medical History:  Diagnosis Date  . Arthritis   . Constipation    OCCASIONAL  . Coronary artery disease   . Diabetes mellitus without complication ([place])   . Environmental allergies   . Hyperlipidemia   . Hypertension   . Inguinal hernia   . Nocturia   . Peripheral arterial disease [facility])     Past Surgical History:  Procedure Laterality Date  . BACK SURGERY    . CARDIAC CATHETERIZATION  05/29/2006   single vessel CAD w/moderate stenosis of the prox. RCA approx 40%,prox. LAD 20-30%,minor irreg. mid abdominal aorta,left iliac artery disease 50-60%  . CATARACT EXTRACTION Right   . HERNIA REPAIR  [id number]  . INGUINAL HERNIA REPAIR N/A 09/23/2012   Procedure: LAPAROSCOPIC INGUINAL HERNIA with umbilical hernia;  Surgeon: Madilyn Hook, DO;  Location: [facility];  Service: General;  Laterality: N/A;  . INSERTION OF MESH Bilateral 09/23/2012   Procedure: INSERTION OF MESH;  Surgeon: Madilyn Hook, DO;  Location: [facility];  Service: General;  Laterality: Bilateral;    There were no vitals filed for this visit.       Subjective Assessment - 12/05/16 1323    Subjective Saw Dr. Carles Collet several weeks ago, and she added controlled-release Sinemet, which seems to help me overnight and in the morning.   Feels like he is more nervous sometimes when taking the medications.  Had a fall last Friday trying to wash the truck, and fell due to it being slippery.  Think I might have twisted my back.     Patient is accompained by: Family member  daughter   Pertinent History ruptured disk in back (years ago per pt report), OA, DM, CAD, HTN, PAD   Patient Stated Goals Pt's goals are to be able to walk where I want to, when I want to.   Currently in Pain? No/denies            [facility] PT Assessment - 12/05/16 1327      Assessment   Medical Diagnosis Parkinson's disease   Referring Provider Tat, Wells Guiles   Onset Date/Surgical Date 10/22/16     Precautions   Precautions Fall  Avoid lifting >20#     Balance Screen   Has the patient fallen in the past 6 months Yes   How many times? 1   Has the patient had a decrease in activity level because of a fear of falling?  Yes   Is the patient reluctant to leave their home because of a fear of falling?  No     Home Ecologist residence   Living Arrangements Spouse/significant other  Daughter nearby   Available Help at Discharge Family   Type of [place]  Access Stairs to enter  One step to enter   [place] - single point;Walker - 2 wheels     Prior Function   Level of Independence Independent   Vocation Retired   Leisure Enjoys working around [employer] and yard, enjoys walking in the park (hasn't been able to do that due to wife being sick); enjoys fishing     Observation/Other Assessments   Focus on Therapeutic Outcomes (FOTO)  NA     Posture/Postural Control   Posture/Postural Control Postural limitations   Postural Limitations Rounded Shoulders;Forward head     Tone   Assessment Location Left Lower Extremity     ROM / Strength   AROM / PROM / Strength Strength;AROM     AROM   Overall AROM  Deficits   Overall AROM Comments Limited in knee extension  bilaterally-hamstring tightness noted, L> R     Strength   Overall Strength Within functional limits for tasks performed     Transfers   Transfers Sit to Stand;Stand to Sit   Sit to Stand 6: Modified independent (Device/Increase time);Without upper extremity assist;From chair/3-in-1   Five time sit to stand comments  10.18   Stand to Sit 6: Modified independent (Device/Increase time);Without upper extremity assist;To chair/3-in-1   Comments Reports difficulty with car transfers     Ambulation/Gait   Ambulation/Gait Yes   Ambulation/Gait Assistance 6: Modified independent (Device/Increase time)   Ambulation Distance (Feet) 200 Feet   Assistive device None   Gait Pattern Step-through pattern;Decreased arm swing - left;Decreased step length - left;Decreased arm swing - right;Decreased trunk rotation   Ambulation Surface Level;Indoor   Gait velocity 11.47 sec = 2.86 ft/sec     6 Minute Walk- Baseline   6 Minute Walk- Baseline yes   HR (bpm) 94   02 Sat (%RA) 96 %     6 Minute walk- Post Test   6 Minute Walk Post Test yes   HR (bpm) 75   02 Sat (%RA) 97 %     6 minute walk test results    Aerobic Endurance Distance Walked 1080     Standardized Balance Assessment   Standardized Balance Assessment Timed Up and Go Test     Timed Up and Go Test   Normal TUG (seconds) 13.72   Manual TUG (seconds) 12.84   Cognitive TUG (seconds) 17.25   TUG Comments Scores >10% difference in TUG and TUG cognitive indicate difficulty with dual tasks/increased fall risk.     High Level Balance   High Level Balance Comments MiniBESTest score:  20/28 (score 0 on rise to toes, 1 on SLS, 1 on feet together/EC on compliant surface and EC on ramp, 1 on change in gait speed, head turns with gait and obstacles, dual tasks)     LLE Tone   LLE Tone Moderate      Mini-BESTest: Balance Evaluation Systems Test  2005-2013 [place]. All rights  reserved. ________________________________________________________________________________________Anticipatory_________Subscore__3___/6 1. SIT TO STAND Instruction: "Cross your arms across your chest. Try not to use your hands unless you must.Do not let your legs lean against the back of the chair when you stand. Please stand up now." X(2) Normal: Comes to stand without use of hands and stabilizes independently. (1) Moderate: Comes to stand WITH use of hands on first attempt. (0) Severe: Unable to stand up from chair without assistance, OR needs several attempts with use of hands. 2. RISE TO TOES Instruction: "  Place your feet shoulder width apart. Place your hands on your hips. Try to rise as high as you can onto your toes. I will count out loud to 3 seconds. Try to hold this pose for at least 3 seconds. Look straight ahead. Rise now." (2) Normal: Stable for 3 s with maximum height. (1) Moderate: Heels up, but not full range (smaller than when holding hands), OR noticeable instability for 3 s. X(0) Severe: < 3 s. 3. STAND ON ONE LEG Instruction: "Look straight ahead. Keep your hands on your hips. Lift your leg off of the ground behind you without touching or resting your raised leg upon your other standing leg. Stay standing on one leg as long as you can. Look straight ahead. Lift now." Left: Time in Seconds Trial 1:__2.29___Trial 2:__2.58___ (2) Normal: 20 s. X(1) Moderate: < 20 s. (0) Severe: Unable. Right: Time in Seconds Trial 1:___1.28__Trial 2:__1.41___ (2) Normal: 20 s. X(1) Moderate: < 20 s. (0) Severe: Unable To score each side separately use the trial with the longest time. To calculate the sub-score and total score use the side [left or right] with the lowest numerical score [i.e. the worse side]. ______________________________________________________________________________________Reactive Postural Control___________Subscore:__6___/6 4. COMPENSATORY STEPPING CORRECTION-  FORWARD Instruction: "Stand with your feet shoulder width apart, arms at your sides. Lean forward against my hands beyond your forward limits. When I let go, do whatever is necessary, including taking a step, to avoid a fall." X(2) Normal: Recovers independently with a single, large step (second realignment step is allowed). (1) Moderate: More than one step used to recover equilibrium. (0) Severe: No step, OR would fall if not caught, OR falls spontaneously. 5. COMPENSATORY STEPPING CORRECTION- BACKWARD Instruction: "Stand with your feet shoulder width apart, arms at your sides. Lean backward against my hands beyond your backward limits. When I let go, do whatever is necessary, including taking a step, to avoid a fall." X(2) Normal: Recovers independently with a single, large step. (1) Moderate: More than one step used to recover equilibrium. (0) Severe: No step, OR would fall if not caught, OR falls spontaneously. 6. COMPENSATORY STEPPING CORRECTION- LATERAL Instruction: "Stand with your feet together, arms down at your sides. Lean into my hand beyond your sideways limit. When I let go, do whatever is necessary, including taking a step, to avoid a fall." Left X(2) Normal: Recovers independently with 1 step (crossover or lateral OK). (1) Moderate: Several steps to recover equilibrium. (0) Severe: Falls, or cannot step. Right X(2) Normal: Recovers independently with 1 step (crossover or lateral OK). (1) Moderate: Several steps to recover equilibrium. (0) Severe: Falls, or cannot step. Use the side with the lowest score to calculate sub-score and total score. ____________________________________________________________________________________Sensory Orientation_____________Subscore:_______4__/6 7. STANCE (FEET TOGETHER); EYES OPEN, FIRM SURFACE Instruction: "Place your hands on your hips. Place your feet together until almost touching. Look straight ahead. Be as stable and still as  possible, until I say stop." Time in seconds:________ X(2) Normal: 30 s. (1) Moderate: < 30 s. (0) Severe: Unable. 8. STANCE (FEET TOGETHER); EYES CLOSED, FOAM SURFACE Instruction: "Step onto the foam. Place your hands on your hips. Place your feet together until almost touching. Be as stable and still as possible, until I say stop. I will start timing when you close your eyes." Time in seconds:_____13.87___ (2) Normal: 30 s. X(1) Moderate: < 30 s. (0) Severe: Unable. 9. INCLINE- EYES CLOSED Instruction: "Step onto the incline ramp. Please stand on the incline ramp with your toes toward the top. Place  your feet shoulder width apart and have your arms down at your sides. I will start timing when you close your eyes." Time in seconds:________ (2) Normal: Stands independently 30 s and aligns with gravity. X(1) Moderate: Stands independently <30 s OR aligns with surface. (0) Severe: Unable. _________________________________________________________________________________________Dynamic Gait ______Subscore___7_____/10 10. CHANGE IN GAIT SPEED Instruction: "Begin walking at your normal speed, when I tell you 'fast', walk as fast as you can. When I say 'slow', walk very slowly." (2) Normal: Significantly changes walking speed without imbalance. X(1) Moderate: Unable to change walking speed or signs of imbalance. (0) Severe: Unable to achieve significant change in walking speed AND signs of imbalance. [place] - HORIZONTAL Instruction: "Begin walking at your normal speed, when I say "right", turn your head and look to the right. When I say "left" turn your head and look to the left. Try to keep yourself walking in a straight line." (2) Normal: performs head turns with no change in gait speed and good balance. X(1) Moderate: performs head turns with reduction in gait speed. (0) Severe: performs head turns with imbalance. 12. WALK WITH PIVOT TURNS Instruction: "Begin walking at  your normal speed. When I tell you to 'turn and stop', turn as quickly as you can, face the opposite direction, and stop. After the turn, your feet should be close together." X(2) Normal: Turns with feet close FAST (< 3 steps) with good balance. (1) Moderate: Turns with feet close SLOW (>4 steps) with good balance. (0) Severe: Cannot turn with feet close at any speed without imbalance. 13. STEP OVER OBSTACLES Instruction: "Begin walking at your normal speed. When you get to the box, step over it, not around it and keep walking." (2) Normal: Able to step over box with minimal change of gait speed and with good balance. X(1) Moderate: Steps over box but touches box OR displays cautious behavior by slowing gait. (0) Severe: Unable to step over box OR steps around box. 14. TIMED UP & GO WITH DUAL TASK [3 METER WALK] Instruction TUG: "When I say 'Go', stand up from chair, walk at your normal speed across the tape on the floor, turn around, and come back to sit in the chair." Instruction TUG with Dual Task: "Count backwards by threes starting at ___. When I say 'Go', stand up from chair, walk at your normal speed across the tape on the floor, turn around, and come back to sit in the chair. Continue counting backwards the entire time." TUG: ________seconds; Dual Task TUG: ________seconds (2) Normal: No noticeable change in sitting, standing or walking while backward counting when compared to TUG without Dual Task. X(1) Moderate: Dual Task affects either counting OR walking (>10%) when compared to the TUG without Dual Task. (0) Severe: Stops counting while walking OR stops walking while counting. When scoring item 14, if subject's gait speed slows more than 10% between the TUG without and with a Dual Task the score should be decreased by a point. TOTAL SCORE: _____20___/28        Objective measurements completed on examination: See above findings.                    PT Short  Term Goals - 12/06/16 1900      PT SHORT TERM GOAL #1   Title Pt will be independent with HEP to address Parkinson's disease-related deficits.   Time 4   Period Weeks   Status New   Target Date 01/03/17  PT SHORT TERM GOAL #2   Title Pt will perform at least 8 of 10 reps of sit<>stand from lower than 18 inch surfaces, minimal to no UE support, independently, for improved ease of transfers.   Time 4   Period Weeks   Status New   Target Date 01/03/17     PT SHORT TERM GOAL #3   Title Pt will improveTUG cognitive score to less than or equal to 15 seconds for decreased fall risk.   Time 4   Period Weeks   Status New   Target Date 01/03/17     PT SHORT TERM GOAL #4   Title Pt will improve SLS to at least 3 seconds each leg, for improved obstacle and step negotiation.   Time 4   Period Weeks   Status New   Target Date 01/03/17           PT Long Term Goals - 12/06/16 1903      PT LONG TERM GOAL #1   Title Pt will verbalize understanding of fall prevention in home environment.     Time 6   Period Weeks   Status New   Target Date 01/17/17     PT LONG TERM GOAL #2   Title Pt will improve TUG and TUG cog score to within <10% difference, for improved dual tasking, decreased fall risk.   Time 6   Period Weeks   Status New   Target Date 01/17/17     PT LONG TERM GOAL #3   Title Pt will improve MiniBESTest score to at least 23/28 for decreased fall risk.   Time 6   Period Weeks   Status New   Target Date 01/17/17     PT LONG TERM GOAL #4   Title Pt will verbalize plans for continued community fitness upon d/c from PT.   Time 6   Period Weeks   Status New   Target Date 01/17/17                Plan - 12/06/16 1853    Clinical Impression Statement Pt is a 71 year old male who presents to OP PT with history of Parkinson's disease, who was recommended to return for PT eval following PD screen in July 2018, showing slowed mobility measures since d/c of last  bout of therapy in 03/2016.  Pt has at least 3 co-morbidities, and he presents with bradykinesia, rigidity, decreased timing and coordination of gait, abnormal posture, decreased balance, slowed transfers, history of 1 fall in recent past.  Pt still enjoys working around house and yard, but per daughter, hasn't been walking around park as much.  Pt would benefit from skilled PT to address the above stated deficits to decrease falls and to improve functional mobility.   History and Personal Factors relevant to plan of care: >3 co-morbidities, at fall risk per TUG cog score and MiniBEStest score, 1 recent fall   Clinical Presentation Stable   Clinical Presentation due to: history of Parkinson's disease   Clinical Decision Making Low   Rehab Potential Good   PT Frequency 2x / week   PT Duration 6 weeks  plus eval   PT Treatment/Interventions ADLs/Self Care Home Management;Functional mobility training;Gait training;Therapeutic activities;Therapeutic exercise;Balance training;Neuromuscular re-education;Patient/family education   PT Next Visit Plan Review current HEP (pt to bring in from previous bout of therapy), and revise as needed; work on sit<>stand transfers from low surfaces, compliant surfaces   Consulted and Agree with Plan of  Care Patient;Family member/caregiver   Family Member Consulted daughter      Patient will benefit from skilled therapeutic intervention in order to improve the following deficits and impairments:  Abnormal gait, Decreased balance, Decreased mobility, Decreased strength, Difficulty walking, Postural dysfunction, Impaired tone  Visit Diagnosis: Other abnormalities of gait and mobility  Unsteadiness on feet  Abnormal posture  Other symptoms and signs involving the nervous system      G-Codes - 01/04/2017 1905    Functional Assessment Tool Used (Outpatient Only) 5x sit<>stand 10.18 sec, gat velocity 2.86 ft/sec, TUG 13.72, TUG c 17.25 sec, MiniBestest score 20/28    Functional Limitation Mobility: Walking and moving around   Mobility: Walking and Moving Around Current Status [phone]) At least 20 percent but less than 40 percent impaired, limited or restricted   Mobility: Walking and Moving Around Goal Status [phone]) At least 1 percent but less than 20 percent impaired, limited or restricted       Problem List Patient Active Problem List   Diagnosis Date Noted  . PD (Parkinson's disease) ([place]) 12/18/2015  . Tremor of both hands 11/22/2015  . PAD (peripheral artery disease) s/p remote left external iliac artery angioplasty 10/28/2012  . Coronary atherosclerosis 10/28/2012  . Hyperlipidemia 10/28/2012  . HTN (hypertension) 10/28/2012  . DM2 (diabetes mellitus, type 2) ([place]) 10/28/2012  . Carotid occlusion, right 10/28/2012    Alfa Leibensperger W. 01/04/17, 7:06 PM  Frazier Butt., PT   [facility] [address] [place], [place], [address] Phone: [phone]   Fax:  [phone]  Name: JERIK FALLETTA MRN: [id number] Date of Birth: Mar 17, 1946

## 2016-12-12 ENCOUNTER — Ambulatory Visit: Payer: Medicare Other | Admitting: Physical Therapy

## 2016-12-12 ENCOUNTER — Ambulatory Visit: Payer: Medicare Other

## 2016-12-12 ENCOUNTER — Encounter: Payer: Self-pay | Admitting: Physical Therapy

## 2016-12-12 DIAGNOSIS — R2689 Other abnormalities of gait and mobility: Secondary | ICD-10-CM

## 2016-12-12 DIAGNOSIS — R2681 Unsteadiness on feet: Secondary | ICD-10-CM

## 2016-12-12 DIAGNOSIS — R29818 Other symptoms and signs involving the nervous system: Secondary | ICD-10-CM | POA: Diagnosis not present

## 2016-12-12 DIAGNOSIS — R293 Abnormal posture: Secondary | ICD-10-CM

## 2016-12-12 DIAGNOSIS — M25612 Stiffness of left shoulder, not elsewhere classified: Secondary | ICD-10-CM | POA: Diagnosis not present

## 2016-12-12 DIAGNOSIS — R471 Dysarthria and anarthria: Secondary | ICD-10-CM | POA: Diagnosis not present

## 2016-12-12 DIAGNOSIS — R29898 Other symptoms and signs involving the musculoskeletal system: Secondary | ICD-10-CM | POA: Diagnosis not present

## 2016-12-12 DIAGNOSIS — R278 Other lack of coordination: Secondary | ICD-10-CM | POA: Diagnosis not present

## 2016-12-12 NOTE — Patient Instructions (Signed)
Axial Extension (Chin Tuck)    Pull chin in and lengthen back of neck, trying to reach the back of your head to the wall (or when lying down, to the bed). Hold ___30_ seconds while counting out loud. Repeat __3__ times. Do __2__ sessions per day.  http://gt2.exer.us/449   Copyright  [employer]. All rights reserved.  Chin Protraction / Retraction

## 2016-12-12 NOTE — Therapy (Signed)
[facility] [address] [place], [place], [address] Phone: [phone]   Fax:  [phone]  Physical Therapy Treatment  Patient Details  Name: Rodney Love MRN: [id number] Date of Birth: 08-26-1945 Referring Provider: Alonza Bogus  Encounter Date: 12/12/2016      PT End of Session - 12/12/16 1509    Visit Number 2   Number of Visits 13   Date for PT Re-Evaluation 02/03/17   Authorization Type UHC Medicare-GCODE every 10th visit   PT Start Time 1405   PT Stop Time 1450   PT Time Calculation (min) 45 min   Activity Tolerance Patient tolerated treatment well   Behavior During Therapy [facility] for tasks assessed/performed      Past Medical History:  Diagnosis Date  . Arthritis   . Constipation    OCCASIONAL  . Coronary artery disease   . Diabetes mellitus without complication ([place])   . Environmental allergies   . Hyperlipidemia   . Hypertension   . Inguinal hernia   . Nocturia   . Peripheral arterial disease [facility])     Past Surgical History:  Procedure Laterality Date  . BACK SURGERY    . CARDIAC CATHETERIZATION  05/29/2006   single vessel CAD w/moderate stenosis of the prox. RCA approx 40%,prox. LAD 20-30%,minor irreg. mid abdominal aorta,left iliac artery disease 50-60%  . CATARACT EXTRACTION Right   . HERNIA REPAIR  [id number]  . INGUINAL HERNIA REPAIR N/A 09/23/2012   Procedure: LAPAROSCOPIC INGUINAL HERNIA with umbilical hernia;  Surgeon: Madilyn Hook, DO;  Location: [facility];  Service: General;  Laterality: N/A;  . INSERTION OF MESH Bilateral 09/23/2012   Procedure: INSERTION OF MESH;  Surgeon: Madilyn Hook, DO;  Location: [facility];  Service: General;  Laterality: Bilateral;    There were no vitals filed for this visit.      Subjective Assessment - 12/12/16 1405    Subjective Reports he tries to walk for 45 minutes per day, 7 days per week. Has been walking a path inside his home since his wife has been  sick with COPD and can't tolerate walking in the heat. In addition he has been doing the seated PWR! Up (feels good on his back first thing in the morning). Has not been doing other PWR! exercises (standing, seated, supine, or at counter).    Patient is accompained by: Family member   Pertinent History ruptured disk in back (years ago per pt report), OA, DM, CAD, HTN, PAD   Patient Stated Goals Pt's goals are to be able to walk where I want to, when I want to.   Currently in Pain? No/denies  back hurts all the time, don't really take notice of that                         [facility] Adult PT Treatment/Exercise - 12/12/16 1459      Ambulation/Gait   Ambulation/Gait Yes   Ambulation/Gait Assistance 5: Supervision   Ambulation/Gait Assistance Details vc for incr stride length, heel strike, velocity (to incr arm swing); pt able to maintain changes for up to 40 ft at a time   Ambulation Distance (Feet) 600 Feet   Assistive device None   Gait Pattern Step-through pattern;Decreased arm swing - left;Decreased step length - left;Decreased arm swing - right;Decreased trunk rotation   Ambulation Surface Level     Posture/Postural Control   Posture/Postural Control Postural limitations   Postural Limitations Rounded Shoulders;Forward head  Posture Comments educated in chin tuck with back to doorframe and in supine with one pillow           PWR [facility]) - 12/12/16 1506    PWR! Up 10   PWR! Rock 10   PWR! Twist 10   PWR Step 10   Comments max cues and hands-on facilitation to correct technique; emphasis on slower speed to allow stretching             PT Education - 12/12/16 1508    Education provided Yes   Education Details PWR! exercises in standing for HEP + chin tuck in standing and supine   Person(s) Educated Patient;Child(ren)   Methods Explanation;Demonstration;Tactile cues;Verbal cues;Handout   Comprehension Verbalized understanding;Returned demonstration;Verbal  cues required;Tactile cues required;Need further instruction          PT Short Term Goals - 12/06/16 1900      PT SHORT TERM GOAL #1   Title Pt will be independent with HEP to address Parkinson's disease-related deficits.   Time 4   Period Weeks   Status New   Target Date 01/03/17     PT SHORT TERM GOAL #2   Title Pt will perform at least 8 of 10 reps of sit<>stand from lower than 18 inch surfaces, minimal to no UE support, independently, for improved ease of transfers.   Time 4   Period Weeks   Status New   Target Date 01/03/17     PT SHORT TERM GOAL #3   Title Pt will improveTUG cognitive score to less than or equal to 15 seconds for decreased fall risk.   Time 4   Period Weeks   Status New   Target Date 01/03/17     PT SHORT TERM GOAL #4   Title Pt will improve SLS to at least 3 seconds each leg, for improved obstacle and step negotiation.   Time 4   Period Weeks   Status New   Target Date 01/03/17           PT Long Term Goals - 12/06/16 1903      PT LONG TERM GOAL #1   Title Pt will verbalize understanding of fall prevention in home environment.     Time 6   Period Weeks   Status New   Target Date 01/17/17     PT LONG TERM GOAL #2   Title Pt will improve TUG and TUG cog score to within <10% difference, for improved dual tasking, decreased fall risk.   Time 6   Period Weeks   Status New   Target Date 01/17/17     PT LONG TERM GOAL #3   Title Pt will improve MiniBESTest score to at least 23/28 for decreased fall risk.   Time 6   Period Weeks   Status New   Target Date 01/17/17     PT LONG TERM GOAL #4   Title Pt will verbalize plans for continued community fitness upon d/c from PT.   Time 6   Period Weeks   Status New   Target Date 01/17/17               Plan - 12/12/16 1510    Clinical Impression Statement Skilled session focused on establishing a HEP. Patient could not demonstrate any of the standing PWR! exercises when asked and  required max cues for learning proper technique. Patient reported his back and shoulders felt much better after these exercises. Also addressed posture to decrease his  forward head, which can impact balance and gait. Pt will continue to benefit from PT to work towards goals.   Rehab Potential Good   PT Frequency 2x / week   PT Duration 6 weeks  plus eval   PT Treatment/Interventions ADLs/Self Care Home Management;Functional mobility training;Gait training;Therapeutic activities;Therapeutic exercise;Balance training;Neuromuscular re-education;Patient/family education   PT Next Visit Plan Review standing PWR! and chin tuck given 9/13; continue to revise HEP from prior episode of PT as needed; work on sit<>stand transfers from low surfaces, compliant surfaces   Consulted and Agree with Plan of Care Patient;Family member/caregiver   Family Member Consulted daughter      Patient will benefit from skilled therapeutic intervention in order to improve the following deficits and impairments:  Abnormal gait, Decreased balance, Decreased mobility, Decreased strength, Difficulty walking, Postural dysfunction, Impaired tone  Visit Diagnosis: Other abnormalities of gait and mobility  Unsteadiness on feet  Abnormal posture     Problem List Patient Active Problem List   Diagnosis Date Noted  . PD (Parkinson's disease) ([place]) 12/18/2015  . Tremor of both hands 11/22/2015  . PAD (peripheral artery disease) s/p remote left external iliac artery angioplasty 10/28/2012  . Coronary atherosclerosis 10/28/2012  . Hyperlipidemia 10/28/2012  . HTN (hypertension) 10/28/2012  . DM2 (diabetes mellitus, type 2) ([place]) 10/28/2012  . Carotid occlusion, right 10/28/2012    Rexanne Mano, PT 12/12/2016, 3:36 PM  [facility] [address] [place], [place], [address] Phone: [phone]   Fax:  [phone]  Name: Rodney Love MRN: [id number] Date  of Birth: 1946/03/07

## 2016-12-12 NOTE — Therapy (Signed)
[facility] [address] [place], [place], [address] Phone: [phone]   Fax:  [phone]  Speech Language Pathology Treatment  Patient Details  Name: Rodney Love MRN: [id number] Date of Birth: 1945/06/04 Referring Provider: Alonza Bogus, D.O.  Encounter Date: 12/12/2016      End of Session - 12/12/16 1431    Visit Number 2   Number of Visits 17   Date for SLP Re-Evaluation 02/14/17   SLP Start Time 59   SLP Stop Time  1400   SLP Time Calculation (min) 42 min      Past Medical History:  Diagnosis Date  . Arthritis   . Constipation    OCCASIONAL  . Coronary artery disease   . Diabetes mellitus without complication ([place])   . Environmental allergies   . Hyperlipidemia   . Hypertension   . Inguinal hernia   . Nocturia   . Peripheral arterial disease [facility])     Past Surgical History:  Procedure Laterality Date  . BACK SURGERY    . CARDIAC CATHETERIZATION  05/29/2006   single vessel CAD w/moderate stenosis of the prox. RCA approx 40%,prox. LAD 20-30%,minor irreg. mid abdominal aorta,left iliac artery disease 50-60%  . CATARACT EXTRACTION Right   . HERNIA REPAIR  [id number]  . INGUINAL HERNIA REPAIR N/A 09/23/2012   Procedure: LAPAROSCOPIC INGUINAL HERNIA with umbilical hernia;  Surgeon: Madilyn Hook, DO;  [facility];  Service: General;  Laterality: N/A;  . INSERTION OF MESH Bilateral 09/23/2012   Procedure: INSERTION OF MESH;  Surgeon: Madilyn Hook, DO;  [facility];  Service: General;  Laterality: Bilateral;    There were no vitals filed for this visit.      Subjective Assessment - 12/12/16 1328    Subjective "My voice might be a little off today - I feel like I have pollen in there."               ADULT SLP TREATMENT - 12/12/16 1330      General Information   Behavior/Cognition Alert;Pleasant mood;Cooperative     Treatment Provided   Treatment provided Cognitive-Linquistic      Cognitive-Linquistic Treatment   Treatment focused on Dysarthria;Patient/family/caregiver education   Skilled Treatment SLP worked with shaping pt's "ah" (due to strained/strangled voice): from "Hey!", to sustained "Hey!", to sustained "ha". Pt used what he rated as 8-9 effort level for sustained "ha" - approx 6-7 seconds in length with average low to mid 80s dB. Pt provided opposites with WNL volume and SLP incr'd complexity to pt telling definitions and he req'd rare min A for loudness.      Assessment / Recommendations / Plan   Plan Continue with current plan of care     Progression Toward Goals   Progression toward goals Progressing toward goals          SLP Education - 12/12/16 1430    Education provided Yes   Education Details loud "ha"   Person(s) Educated Patient;Child(ren)   Methods Explanation;Demonstration;Verbal cues   Comprehension Verbalized understanding;Returned demonstration;Need further instruction          SLP Short Term Goals - 12/12/16 1433      SLP SHORT TERM GOAL #1   Title Pt will average 90dB on loud /a/ with rare min A over 5 sessions   Time 4   Period Weeks   Status On-going     SLP SHORT TERM GOAL #2   Title Pt will average in low 70s  dB on 90% of sentence level structured speech tasks with rare min A, over 3 sessions   Time 4   Period Weeks   Status On-going     SLP SHORT TERM GOAL #3   Title Pt will maintain average of 70dB during 8 minute simple conversation with rare min A over three sessions   Time 4   Period Weeks   Status On-going     SLP SHORT TERM GOAL #4   Title pt will demo knowledge of overt s/s dysphagia with meals with modified independence   Time 4   Period Weeks   Status On-going          SLP Long Term Goals - 12/12/16 1433      SLP LONG TERM GOAL #1   Title Pt will maintain average of 70dB during 15 minute moderately complex conversation with rare min A over two sessions   Time 8   Period Weeks   Status  On-going     SLP LONG TERM GOAL #2   Title Pt. will maintain audible conversation over 12 minutes in noisy environment outside of therapy room over two sessions   Time 8   Period Weeks   Status On-going          Plan - 12/12/16 1431    Clinical Impression Statement Pt did well for his first ST treatment today, exhibiting WNL loudness with simple word and phrase responses with cues from sLP. SLP again shaped loud /a/ with using loud "ha" instead.  Pt is excellent candidate for a course of ST focusing on speech loudness and breath support in conversation, across speaking situations.   Speech Therapy Frequency 2x / week   Duration --  8 weeks   Treatment/Interventions Compensatory strategies;Patient/family education;Functional tasks;Cueing hierarchy;Compensatory techniques;Internal/external aids;SLP instruction and feedback   Potential to Achieve Goals Good      Patient will benefit from skilled therapeutic intervention in order to improve the following deficits and impairments:   Dysarthria and anarthria    Problem List Patient Active Problem List   Diagnosis Date Noted  . PD (Parkinson's disease) ([place]) 12/18/2015  . Tremor of both hands 11/22/2015  . PAD (peripheral artery disease) s/p remote left external iliac artery angioplasty 10/28/2012  . Coronary atherosclerosis 10/28/2012  . Hyperlipidemia 10/28/2012  . HTN (hypertension) 10/28/2012  . DM2 (diabetes mellitus, type 2) ([place]) 10/28/2012  . Carotid occlusion, right 10/28/2012    [facility] ,MS, CCC-SLP  12/12/2016, 2:33 PM  [facility] [address] [place], [place], [address] Phone: [phone]   Fax:  [phone]   Name: Rodney Love MRN: [id number] Date of Birth: [birth year]

## 2016-12-12 NOTE — Patient Instructions (Signed)
  Do your loud "ha" x5 twice a day  Read out loud for two minutes every day  Work on the speaking worksheets for 15 minutes, twice a day  (at least 4 days/week)

## 2016-12-17 ENCOUNTER — Ambulatory Visit: Payer: Medicare Other | Admitting: Occupational Therapy

## 2016-12-17 ENCOUNTER — Ambulatory Visit: Payer: Medicare Other | Admitting: Physical Therapy

## 2016-12-17 DIAGNOSIS — R278 Other lack of coordination: Secondary | ICD-10-CM

## 2016-12-17 DIAGNOSIS — R29818 Other symptoms and signs involving the nervous system: Secondary | ICD-10-CM | POA: Diagnosis not present

## 2016-12-17 DIAGNOSIS — R2681 Unsteadiness on feet: Secondary | ICD-10-CM

## 2016-12-17 DIAGNOSIS — R293 Abnormal posture: Secondary | ICD-10-CM

## 2016-12-17 DIAGNOSIS — R2689 Other abnormalities of gait and mobility: Secondary | ICD-10-CM | POA: Diagnosis not present

## 2016-12-17 DIAGNOSIS — R29898 Other symptoms and signs involving the musculoskeletal system: Secondary | ICD-10-CM

## 2016-12-17 DIAGNOSIS — M25612 Stiffness of left shoulder, not elsewhere classified: Secondary | ICD-10-CM | POA: Diagnosis not present

## 2016-12-17 DIAGNOSIS — R471 Dysarthria and anarthria: Secondary | ICD-10-CM | POA: Diagnosis not present

## 2016-12-17 NOTE — Therapy (Signed)
[facility] [address] [place], [place], [address] Phone: [phone]   Fax:  [phone]  Occupational Therapy Treatment  Patient Details  Name: Rodney Love MRN: [id number] Date of Birth: [birth year] Referring Provider: Dr. Wells Guiles Tat   Encounter Date: 12/17/2016      OT End of Session - 12/17/16 1435    Visit Number 2   Number of Visits 17   Date for OT Re-Evaluation 02/03/17   Authorization Type UHC Medicare, no visit limit/no auth; G-code needed   Authorization - Visit Number 2   Authorization - Number of Visits 10   OT Start Time 7353   OT Stop Time 1445   OT Time Calculation (min) 40 min   Activity Tolerance Patient tolerated treatment well   Behavior During Therapy [facility] for tasks assessed/performed      Past Medical History:  Diagnosis Date  . Arthritis   . Constipation    OCCASIONAL  . Coronary artery disease   . Diabetes mellitus without complication ([place])   . Environmental allergies   . Hyperlipidemia   . Hypertension   . Inguinal hernia   . Nocturia   . Peripheral arterial disease [facility])     Past Surgical History:  Procedure Laterality Date  . BACK SURGERY    . CARDIAC CATHETERIZATION  05/29/2006   single vessel CAD w/moderate stenosis of the prox. RCA approx 40%,prox. LAD 20-30%,minor irreg. mid abdominal aorta,left iliac artery disease 50-60%  . CATARACT EXTRACTION Right   . HERNIA REPAIR  [id number]  . INGUINAL HERNIA REPAIR N/A 09/23/2012   Procedure: LAPAROSCOPIC INGUINAL HERNIA with umbilical hernia;  Surgeon: Madilyn Hook, DO;  [facility];  Service: General;  Laterality: N/A;  . INSERTION OF MESH Bilateral 09/23/2012   Procedure: INSERTION OF MESH;  Surgeon: Madilyn Hook, DO;  [facility];  Service: General;  Laterality: Bilateral;    There were no vitals filed for this visit.      Subjective Assessment - 12/17/16 1406    Patient Stated Goals improve buttoning,  donning shoes/socks   Currently in Pain? Yes   Pain Score 3    Pain Location Back   Pain Descriptors / Indicators Aching   Pain Type Chronic pain   Pain Onset More than a month ago   Pain Frequency Intermittent   Aggravating Factors  unknown   Pain Relieving Factors unknown                              OT Education - 12/17/16 1433    Education provided Yes   Education Details PWR! up, rock and twist in supine review, 10 reps each, coordination HEP -flipping cards, dealing cards, stacking pennies, rotating and tossing ball with big movements   Person(s) Educated Patient;Child(ren)   Methods Explanation;Demonstration;Verbal cues  has handout already for both   Comprehension Verbalized understanding;Returned demonstration;Verbal cues required          OT Short Term Goals - 12/05/16 1623      OT SHORT TERM GOAL #1   Title Pt will be independent with updated HEP.--check 01/02/17   Time 4   Period Weeks   Status New     OT SHORT TERM GOAL #2   Title Pt will improve L hand coordination for ADLs as shown by improving time on 9-hole peg test by at least 5sec.   Baseline L-43.91sec   Time 4   Period  Weeks   Status New     OT SHORT TERM GOAL #3   Title Pt will be able to don shoes/sock mod I.   Baseline needs assist   Time 4   Period Weeks   Status New     OT SHORT TERM GOAL #4   Title Pt will verbalize understanding of appropriate community resources and ways to prevent future complications related to PD.   Baseline -----   Time 4   Period Weeks   Status New           OT Long Term Goals - 12/05/16 1633      OT LONG TERM GOAL #1   Title Pt will verbalize understanding of adaptive strategies for ADLs/IADLs to incr ease, incr safety, and decr risk of pain.--check LTGs 02/03/17   Time 8   Period Weeks   Status New     OT LONG TERM GOAL #2   Title Pt will improve bilateral functional reaching/coordination as shown by improving score on box and  blocks test by at least 3 with RUE and at least 5 with LUE>   Baseline R-47 blocks, L-41 blocks   Time 8   Period Weeks   Status New     OT LONG TERM GOAL #3   Title Pt will be able to fasten/unfasten 3 buttons in less than 60sec.   Baseline ---   Time 8   Period Weeks   Status New     OT LONG TERM GOAL #4   Title Pt will improve balance/functional reaching for ADLs/IADLs as shown by improving standing functional reach to 10 inches or greater bilaterally   Baseline R-9", L-9.5"   Time 8   Period Weeks   Status New     OT LONG TERM GOAL #5   Title Pt will improve L hand coordination for ADLs as shown by improving time on 9-hole peg test by at least 10sec.   Baseline 43.91sec   Time 8   Period Weeks   Status New     Long Term Additional Goals   Additional Long Term Goals --               Plan - 12/17/16 1436    Clinical Impression Statement Pt is progressing towards goals. He demonstrates good perfromance of PWr1 exercises in supine and fine motor coordination tasks, following review.   Rehab Potential Good   OT Frequency 2x / week   OT Duration 8 weeks   OT Treatment/Interventions Self-care/ADL training;Cryotherapy;Therapeutic exercise;DME and/or AE instruction;Therapist, nutritional;Therapeutic activities;Patient/family education;Balance training;Cognitive remediation/compensation;Manual Therapy;Neuromuscular education;Moist Heat;Energy conservation;Passive range of motion;Therapeutic exercises   Plan sitting PWR! moves review   Consulted and Agree with Plan of Care Patient;Family member/caregiver   Family Member Consulted daughter      Patient will benefit from skilled therapeutic intervention in order to improve the following deficits and impairments:  Decreased balance, Decreased mobility, Decreased activity tolerance, Decreased coordination, Decreased knowledge of precautions, Decreased safety awareness, Impaired tone, Improper body mechanics, Impaired UE  functional use, Decreased range of motion, Decreased knowledge of use of DME  Visit Diagnosis: Other symptoms and signs involving the nervous system  Other symptoms and signs involving the musculoskeletal system  Other lack of coordination  Abnormal posture  Unsteadiness on feet  Other abnormalities of gait and mobility    Problem List Patient Active Problem List   Diagnosis Date Noted  . PD (Parkinson's disease) ([place]) 12/18/2015  . Tremor of both hands 11/22/2015  .  PAD (peripheral artery disease) s/p remote left external iliac artery angioplasty 10/28/2012  . Coronary atherosclerosis 10/28/2012  . Hyperlipidemia 10/28/2012  . HTN (hypertension) 10/28/2012  . DM2 (diabetes mellitus, type 2) ([place]) 10/28/2012  . Carotid occlusion, right 10/28/2012    Mahkai Fangman 12/17/2016, 2:38 PM  [facility] [address] [place], [place], [address] Phone: [phone]   Fax:  [phone]  Name: Rodney Love MRN: [id number] Date of Birth: Jul 18, 1945

## 2016-12-18 NOTE — Therapy (Signed)
[facility] [address] [place], [place], [address] Phone: [phone]   Fax:  [phone]  Physical Therapy Treatment  Patient Details  Name: Rodney Love MRN: [id number] Date of Birth: Jul 13, 1945 Referring Provider: Alonza Bogus  Encounter Date: 12/17/2016      PT End of Session - 12/18/16 1736    Visit Number 3   Number of Visits 13   Date for PT Re-Evaluation 02/03/17   Authorization Type UHC Medicare-GCODE every 10th visit   PT Start Time 1320   PT Stop Time 1359   PT Time Calculation (min) 39 min   Activity Tolerance Patient tolerated treatment well   Behavior During Therapy [facility] for tasks assessed/performed      Past Medical History:  Diagnosis Date  . Arthritis   . Constipation    OCCASIONAL  . Coronary artery disease   . Diabetes mellitus without complication ([place])   . Environmental allergies   . Hyperlipidemia   . Hypertension   . Inguinal hernia   . Nocturia   . Peripheral arterial disease [facility])     Past Surgical History:  Procedure Laterality Date  . BACK SURGERY    . CARDIAC CATHETERIZATION  05/29/2006   single vessel CAD w/moderate stenosis of the prox. RCA approx 40%,prox. LAD 20-30%,minor irreg. mid abdominal aorta,left iliac artery disease 50-60%  . CATARACT EXTRACTION Right   . HERNIA REPAIR  [id number]  . INGUINAL HERNIA REPAIR N/A 09/23/2012   Procedure: LAPAROSCOPIC INGUINAL HERNIA with umbilical hernia;  Surgeon: Madilyn Hook, DO;  Location: [facility];  Service: General;  Laterality: N/A;  . INSERTION OF MESH Bilateral 09/23/2012   Procedure: INSERTION OF MESH;  Surgeon: Madilyn Hook, DO;  Location: [facility];  Service: General;  Laterality: Bilateral;    There were no vitals filed for this visit.      Subjective Assessment - 12/17/16 1320    Subjective Think the medicine just makes me feel funny sometimes.   Patient is accompained by: Family member   Pertinent History ruptured  disk in back (years ago per pt report), OA, DM, CAD, HTN, PAD   Patient Stated Goals Pt's goals are to be able to walk where I want to, when I want to.   Currently in Pain? No/denies                         [facility] Adult PT Treatment/Exercise - 12/18/16 0001      Transfers   Transfers Sit to Stand;Stand to Sit   Sit to Stand 6: Modified independent (Device/Increase time);Without upper extremity assist;From chair/3-in-1   Stand to Sit 6: Modified independent (Device/Increase time);Without upper extremity assist;To chair/3-in-1   Number of Reps See below     Posture/Postural Control   Posture Comments Reviewed standing chin tuck exercise x 5 reps           [facility]) - 12/18/16 1723    PWR! exercises Moves in standing;Moves in sitting   PWR! Up x 10   PWR! Rock x 10 reps each side   PWR! Twist x 10 reps each side   PWR Step x 10 reps each side   Comments PT provides max cues-verbal and tactile for proper technique   PWR! Sit to Stand x 10 reps each from 20", 18", 16" surfaces.  Also x 10 reps from 14" surface with BOSU top, simulating car transfer.     PWR! Step x 5 reps each  side, sitting on BOSU surface on 14" box, simulating car transfer, with cues for increased step height and width.  Cues to simulate hand placement for improved ease of car transfers.          Balance Exercises - 12/18/16 1728      Balance Exercises: Standing   Other Standing Exercises Standing on incline and on decline of ramp:  marching in place x 10 reps, forward step taps x 10 reps, standing in place with head turns/head nods x 5 reps each, with min guard assistance.           PT Education - 12/18/16 1730    Education provided Yes   Education Details Reviewed Standing PWR! Moves and advised patient to perform daily if able; answered pt's questions regarding feeling "off or dizzy" sometimes with mid-day dose of Sinemet-educated patient on not taking that medication with high protein  meals (which he sometimes takes when he eats sandwich)   Person(s) Educated Patient;Child(ren)   Methods Explanation  Pt already has PWR! MOves handout in standing   Comprehension Verbalized understanding          PT Short Term Goals - 12/06/16 1900      PT SHORT TERM GOAL #1   Title Pt will be independent with HEP to address Parkinson's disease-related deficits.   Time 4   Period Weeks   Status New   Target Date 01/03/17     PT SHORT TERM GOAL #2   Title Pt will perform at least 8 of 10 reps of sit<>stand from lower than 18 inch surfaces, minimal to no UE support, independently, for improved ease of transfers.   Time 4   Period Weeks   Status New   Target Date 01/03/17     PT SHORT TERM GOAL #3   Title Pt will improveTUG cognitive score to less than or equal to 15 seconds for decreased fall risk.   Time 4   Period Weeks   Status New   Target Date 01/03/17     PT SHORT TERM GOAL #4   Title Pt will improve SLS to at least 3 seconds each leg, for improved obstacle and step negotiation.   Time 4   Period Weeks   Status New   Target Date 01/03/17           PT Long Term Goals - 12/06/16 1903      PT LONG TERM GOAL #1   Title Pt will verbalize understanding of fall prevention in home environment.     Time 6   Period Weeks   Status New   Target Date 01/17/17     PT LONG TERM GOAL #2   Title Pt will improve TUG and TUG cog score to within <10% difference, for improved dual tasking, decreased fall risk.   Time 6   Period Weeks   Status New   Target Date 01/17/17     PT LONG TERM GOAL #3   Title Pt will improve MiniBESTest score to at least 23/28 for decreased fall risk.   Time 6   Period Weeks   Status New   Target Date 01/17/17     PT LONG TERM GOAL #4   Title Pt will verbalize plans for continued community fitness upon d/c from PT.   Time 6   Period Weeks   Status New   Target Date 01/17/17               Plan - 12/18/16  1737    Clinical  Impression Statement Reviewed PWR! Moves and postural exercises, continuing to focus on balance as well.  Pt with slight improvement in performance of standing PWR! Moves, but does not report doing them at home since last visit.  Will continue to benefit from skilled therapy to address posture, balance, gait.   Rehab Potential Good   PT Frequency 2x / week   PT Duration 6 weeks  plus eval   PT Treatment/Interventions ADLs/Self Care Home Management;Functional mobility training;Gait training;Therapeutic activities;Therapeutic exercise;Balance training;Neuromuscular re-education;Patient/family education   PT Next Visit Plan Continue to work on [place]! Moves, compliant surface balance activities and low surface sit<>stand activities   Consulted and Agree with Plan of Care Patient;Family member/caregiver   Family Member Consulted daughter      Patient will benefit from skilled therapeutic intervention in order to improve the following deficits and impairments:  Abnormal gait, Decreased balance, Decreased mobility, Decreased strength, Difficulty walking, Postural dysfunction, Impaired tone  Visit Diagnosis: Unsteadiness on feet  Abnormal posture     Problem List Patient Active Problem List   Diagnosis Date Noted  . PD (Parkinson's disease) ([place]) 12/18/2015  . Tremor of both hands 11/22/2015  . PAD (peripheral artery disease) s/p remote left external iliac artery angioplasty 10/28/2012  . Coronary atherosclerosis 10/28/2012  . Hyperlipidemia 10/28/2012  . HTN (hypertension) 10/28/2012  . DM2 (diabetes mellitus, type 2) ([place]) 10/28/2012  . Carotid occlusion, right 10/28/2012    Shatarra Wehling W. 12/18/2016, 5:41 PM  Frazier Butt., PT   [facility] [address] [place], [place], [address] Phone: [phone]   Fax:  [phone]  Name: Rodney Love MRN: [id number] Date of Birth: October 21, 1945

## 2016-12-19 DIAGNOSIS — H2181 Floppy iris syndrome: Secondary | ICD-10-CM | POA: Diagnosis not present

## 2016-12-19 DIAGNOSIS — E119 Type 2 diabetes mellitus without complications: Secondary | ICD-10-CM | POA: Diagnosis not present

## 2016-12-19 DIAGNOSIS — H2512 Age-related nuclear cataract, left eye: Secondary | ICD-10-CM | POA: Diagnosis not present

## 2016-12-19 DIAGNOSIS — H5703 Miosis: Secondary | ICD-10-CM | POA: Diagnosis not present

## 2016-12-19 DIAGNOSIS — Z961 Presence of intraocular lens: Secondary | ICD-10-CM | POA: Diagnosis not present

## 2016-12-23 ENCOUNTER — Ambulatory Visit: Payer: Medicare Other | Admitting: Occupational Therapy

## 2016-12-23 ENCOUNTER — Ambulatory Visit: Payer: Medicare Other | Admitting: Physical Therapy

## 2016-12-23 ENCOUNTER — Ambulatory Visit: Payer: Medicare Other

## 2016-12-26 ENCOUNTER — Ambulatory Visit: Payer: Medicare Other | Admitting: Physical Therapy

## 2016-12-26 ENCOUNTER — Ambulatory Visit: Payer: Medicare Other

## 2016-12-26 ENCOUNTER — Ambulatory Visit: Payer: Medicare Other | Admitting: Occupational Therapy

## 2016-12-26 DIAGNOSIS — R278 Other lack of coordination: Secondary | ICD-10-CM

## 2016-12-26 DIAGNOSIS — R29818 Other symptoms and signs involving the nervous system: Secondary | ICD-10-CM

## 2016-12-26 DIAGNOSIS — R471 Dysarthria and anarthria: Secondary | ICD-10-CM

## 2016-12-26 DIAGNOSIS — R293 Abnormal posture: Secondary | ICD-10-CM | POA: Diagnosis not present

## 2016-12-26 DIAGNOSIS — M25612 Stiffness of left shoulder, not elsewhere classified: Secondary | ICD-10-CM | POA: Diagnosis not present

## 2016-12-26 DIAGNOSIS — R2689 Other abnormalities of gait and mobility: Secondary | ICD-10-CM | POA: Diagnosis not present

## 2016-12-26 DIAGNOSIS — R29898 Other symptoms and signs involving the musculoskeletal system: Secondary | ICD-10-CM | POA: Diagnosis not present

## 2016-12-26 DIAGNOSIS — R2681 Unsteadiness on feet: Secondary | ICD-10-CM

## 2016-12-26 NOTE — Patient Instructions (Addendum)
Getting Into / Out of Bed    Lower self to lie down on one side by raising legs and lowering head at the same time. Use arms to assist moving without twisting. Bend both knees to roll onto back if desired. To sit up, start from lying on side, and use same move-ments in reverse. Keep trunk aligned with legs.   Copyright  VHI. All rights reserved.   

## 2016-12-26 NOTE — Therapy (Signed)
[facility] [address] [place], [place], [address] Phone: [phone]   Fax:  [phone]  Speech Language Pathology Treatment  Patient Details  Name: Rodney Love MRN: [id number] Date of Birth: [birth year] Referring Provider: Alonza Bogus, D.O.  Encounter Date: 12/26/2016      End of Session - 12/26/16 1424    Visit Number 3   Number of Visits 17   Date for SLP Re-Evaluation 02/14/17   SLP Start Time 5852   SLP Stop Time  1445   SLP Time Calculation (min) 43 min   Activity Tolerance Patient tolerated treatment well      Past Medical History:  Diagnosis Date  . Arthritis   . Constipation    OCCASIONAL  . Coronary artery disease   . Diabetes mellitus without complication ([place])   . Environmental allergies   . Hyperlipidemia   . Hypertension   . Inguinal hernia   . Nocturia   . Peripheral arterial disease [facility])     Past Surgical History:  Procedure Laterality Date  . BACK SURGERY    . CARDIAC CATHETERIZATION  05/29/2006   single vessel CAD w/moderate stenosis of the prox. RCA approx 40%,prox. LAD 20-30%,minor irreg. mid abdominal aorta,left iliac artery disease 50-60%  . CATARACT EXTRACTION Right   . HERNIA REPAIR  [id number]  . INGUINAL HERNIA REPAIR N/A 09/23/2012   Procedure: LAPAROSCOPIC INGUINAL HERNIA with umbilical hernia;  Surgeon: Madilyn Hook, DO;  [facility];  Service: General;  Laterality: N/A;  . INSERTION OF MESH Bilateral 09/23/2012   Procedure: INSERTION OF MESH;  Surgeon: Madilyn Hook, DO;  [facility]: [facility];  Service: General;  Laterality: Bilateral;    There were no vitals filed for this visit.      Subjective Assessment - 12/26/16 1403    Subjective "Nothing to brag about."   Patient is accompained by: Family member  daughter               ADULT SLP TREATMENT - 12/26/16 1406      General Information   Behavior/Cognition Alert;Pleasant mood;Cooperative     Pain  Assessment   Pain Assessment No/denies pain  "I think that exercise helped it."     Cognitive-Linquistic Treatment   Treatment focused on Dysarthria   Skilled Treatment "My wife said I'm talking a little bit louder that I had been talking." SLP used loud /a/ to recalibrate pt's conversational loudness - average today in low 90s dB with rare min A for loudness and full breath/breath support. In sentence tasks pt maintained WNL voice 95% of the time with occasional min A. SLP had to remind pt about the task usually (tell a personality trait or job skill necessary to perform job well). Pt maintained louder, WNL speech volume 85-90% of the time with rare min A. SLP educated/re-educated pt re: abdominal breathing and provided model with notecard about interplay wiht abdominal musculature and diaphragm movement. Simple conversation for 5 minutes req'd SBA from SLP for WNL loudness.      Assessment / Recommendations / Plan   Plan Continue with current plan of care     Progression Toward Goals   Progression toward goals Progressing toward goals          SLP Education - 12/26/16 1449    Education provided Yes   Education Details mechanics of interplay between abdominal musculature and diaphragm when breathing for speech   Person(s) Educated Patient;Child(ren)   Methods Explanation;Demonstration   Comprehension Verbalized  understanding;Returned demonstration          SLP Short Term Goals - 12/26/16 1450      SLP SHORT TERM GOAL #1   Title Pt will average 90dB on loud /a/ with rare min A over 5 sessions   Baseline 12-26-16   Time 3   Period Weeks   Status On-going     SLP SHORT TERM GOAL #2   Title Pt will average in low 70s dB on 90% of sentence level structured speech tasks with rare min A, over 3 sessions   Baseline 12-27-26   Time 3   Period Weeks   Status On-going     SLP SHORT TERM GOAL #3   Title Pt will maintain average of 70dB during 8 minute simple conversation with rare  min A over three sessions   Time 3   Period Weeks   Status On-going     SLP SHORT TERM GOAL #4   Title pt will demo knowledge of overt s/s dysphagia with meals with modified independence   Time 3   Period Weeks   Status On-going          SLP Long Term Goals - 12/26/16 1451      SLP LONG TERM GOAL #1   Title Pt will maintain average of 70dB during 15 minute moderately complex conversation with rare min A over two sessions   Time 7   Period Weeks   Status On-going     SLP LONG TERM GOAL #2   Title Pt. will maintain audible conversation over 12 minutes in noisy environment outside of therapy room over two sessions   Time 7   Period Weeks   Status On-going          Plan - 12/26/16 1424    Clinical Impression Statement Pt did well again in treatment today, exhibiting WNL loudness with phrase and sentence responses with less cues from SLP than previous session. SLP did not need to shape pt's loud /a/ today by using loud "ha" instead. Skilled ST focusing on speech loudness and breath support in conversation, across speaking situations.   Speech Therapy Frequency 2x / week   Duration --  8 weeks   Treatment/Interventions Compensatory strategies;Patient/family education;Functional tasks;Cueing hierarchy;Compensatory techniques;Internal/external aids;SLP instruction and feedback   Potential to Achieve Goals Good      Patient will benefit from skilled therapeutic intervention in order to improve the following deficits and impairments:   Dysarthria and anarthria    Problem List Patient Active Problem List   Diagnosis Date Noted  . PD (Parkinson's disease) ([place]) 12/18/2015  . Tremor of both hands 11/22/2015  . PAD (peripheral artery disease) s/p remote left external iliac artery angioplasty 10/28/2012  . Coronary atherosclerosis 10/28/2012  . Hyperlipidemia 10/28/2012  . HTN (hypertension) 10/28/2012  . DM2 (diabetes mellitus, type 2) ([place]) 10/28/2012  . Carotid  occlusion, right 10/28/2012    [facility] ,MS, CCC-SLP  12/26/2016, 2:53 PM  [facility] [address] [place], [place], [address] Phone: [phone]   Fax:  [phone]   Name: Rodney Love MRN: [id number] Date of Birth: 06/11/1945

## 2016-12-26 NOTE — Patient Instructions (Signed)
  Please complete the assigned speech therapy homework prior to your next session and return it to the speech therapist at your next visit.  

## 2016-12-26 NOTE — Therapy (Signed)
[facility] [address] [place], [place], [address] Phone: [phone]   Fax:  [phone]  Occupational Therapy Treatment  Patient Details  Name: Rodney Love MRN: [id number] Date of Birth: [birth year] Referring Provider: Dr. Wells Guiles Tat   Encounter Date: 12/26/2016      OT End of Session - 12/26/16 1505    Visit Number 3   Number of Visits 17   Date for OT Re-Evaluation 02/03/17   Authorization Type UHC Medicare, no visit limit/no auth; G-code needed   Authorization - Visit Number 3   Authorization - Number of Visits 10   OT Start Time 3818   OT Stop Time 1530   OT Time Calculation (min) 42 min      Past Medical History:  Diagnosis Date  . Arthritis   . Constipation    OCCASIONAL  . Coronary artery disease   . Diabetes mellitus without complication ([place])   . Environmental allergies   . Hyperlipidemia   . Hypertension   . Inguinal hernia   . Nocturia   . Peripheral arterial disease [facility])     Past Surgical History:  Procedure Laterality Date  . BACK SURGERY    . CARDIAC CATHETERIZATION  05/29/2006   single vessel CAD w/moderate stenosis of the prox. RCA approx 40%,prox. LAD 20-30%,minor irreg. mid abdominal aorta,left iliac artery disease 50-60%  . CATARACT EXTRACTION Right   . HERNIA REPAIR  [id number]  . INGUINAL HERNIA REPAIR N/A 09/23/2012   Procedure: LAPAROSCOPIC INGUINAL HERNIA with umbilical hernia;  Surgeon: Madilyn Hook, DO;  [facility];  Service: General;  Laterality: N/A;  . INSERTION OF MESH Bilateral 09/23/2012   Procedure: INSERTION OF MESH;  Surgeon: Madilyn Hook, DO;  [facility];  Service: General;  Laterality: Bilateral;    There were no vitals filed for this visit.      Subjective Assessment - 12/26/16 1449    Subjective  mild back pain   Pertinent History Parkinson's disease; PAD, HTN, DM, hyperlipedemia, hx of low back pain   Patient Stated Goals improve buttoning,  donning shoes/socks   Currently in Pain? Yes   Pain Score 2    Pain Location Back   Pain Descriptors / Indicators Aching   Pain Type Chronic pain   Pain Onset More than a month ago   Aggravating Factors  lying down, getting up   Pain Relieving Factors stretching                Treatment: arm bike x 6 mins level 1 for conditioning, pt maintained 40 rpm Dynamic functional reaching with left and right UE's, stepping over target for emphais on larger step, min-mod v.c  for larger amplitude movements               OT Education - 12/26/16 1510    Education provided Yes   Education Details reviewed PWR! up, rock and twist in modified quadraped, reviewed activities from coordination HEP, flipping/ dealing cards, rotating 2 golf balls in hand.   Person(s) Educated Patient   Methods Explanation;Demonstration;Verbal cues   Comprehension Verbalized understanding;Returned demonstration;Verbal cues required          OT Short Term Goals - 12/05/16 1623      OT SHORT TERM GOAL #1   Title Pt will be independent with updated HEP.--check 01/02/17   Time 4   Period Weeks   Status New     OT SHORT TERM GOAL #2   Title Pt  will improve L hand coordination for ADLs as shown by improving time on 9-hole peg test by at least 5sec.   Baseline L-43.91sec   Time 4   Period Weeks   Status New     OT SHORT TERM GOAL #3   Title Pt will be able to don shoes/sock mod I.   Baseline needs assist   Time 4   Period Weeks   Status New     OT SHORT TERM GOAL #4   Title Pt will verbalize understanding of appropriate community resources and ways to prevent future complications related to PD.   Baseline -----   Time 4   Period Weeks   Status New           OT Long Term Goals - 12/05/16 1633      OT LONG TERM GOAL #1   Title Pt will verbalize understanding of adaptive strategies for ADLs/IADLs to incr ease, incr safety, and decr risk of pain.--check LTGs 02/03/17   Time 8    Period Weeks   Status New     OT LONG TERM GOAL #2   Title Pt will improve bilateral functional reaching/coordination as shown by improving score on box and blocks test by at least 3 with RUE and at least 5 with LUE>   Baseline R-47 blocks, L-41 blocks   Time 8   Period Weeks   Status New     OT LONG TERM GOAL #3   Title Pt will be able to fasten/unfasten 3 buttons in less than 60sec.   Baseline ---   Time 8   Period Weeks   Status New     OT LONG TERM GOAL #4   Title Pt will improve balance/functional reaching for ADLs/IADLs as shown by improving standing functional reach to 10 inches or greater bilaterally   Baseline R-9", L-9.5"   Time 8   Period Weeks   Status New     OT LONG TERM GOAL #5   Title Pt will improve L hand coordination for ADLs as shown by improving time on 9-hole peg test by at least 10sec.   Baseline 43.91sec   Time 8   Period Weeks   Status New     Long Term Additional Goals   Additional Long Term Goals --               Plan - 12/26/16 1518    Clinical Impression Statement Pt is progressing towards goals. He demonstrates understanding of modified quadraped PWR! exercises   Rehab Potential Good   OT Frequency 2x / week   OT Duration 8 weeks   Consulted and Agree with Plan of Care Patient;Family member/caregiver   Family Member Consulted daughter      Patient will benefit from skilled therapeutic intervention in order to improve the following deficits and impairments:  Decreased balance, Decreased mobility, Decreased activity tolerance, Decreased coordination, Decreased knowledge of precautions, Decreased safety awareness, Impaired tone, Improper body mechanics, Impaired UE functional use, Decreased range of motion, Decreased knowledge of use of DME  Visit Diagnosis: Abnormal posture  Other symptoms and signs involving the nervous system  Other symptoms and signs involving the musculoskeletal system  Other lack of  coordination    Problem List Patient Active Problem List   Diagnosis Date Noted  . PD (Parkinson's disease) ([place]) 12/18/2015  . Tremor of both hands 11/22/2015  . PAD (peripheral artery disease) s/p remote left external iliac artery angioplasty 10/28/2012  . Coronary atherosclerosis 10/28/2012  .  Hyperlipidemia 10/28/2012  . HTN (hypertension) 10/28/2012  . DM2 (diabetes mellitus, type 2) ([place]) 10/28/2012  . Carotid occlusion, right 10/28/2012    Rodney Love 12/26/2016, 3:19 PM Rodney Love, OTR/L [phone] Phone: [phone] 3:20 PM 12/26/16 [facility] [address] [place], [place], [address] Phone: [phone]   Fax:  [phone]  Name: Rodney Love MRN: [id number] Date of Birth: Aug 25, 1945

## 2016-12-27 NOTE — Therapy (Signed)
[facility] [address] [place], [place], [address] Phone: [phone]   Fax:  [phone]  Physical Therapy Treatment  Patient Details  Name: Rodney Love MRN: [id number] Date of Birth: [birth year] Referring Provider: Alonza Bogus  Encounter Date: 12/26/2016      PT End of Session - 12/27/16 1421    Visit Number 4   Number of Visits 13   Date for PT Re-Evaluation 02/03/17   Authorization Type UHC Medicare-GCODE every 10th visit   PT Start Time 1326  pt arrived late   PT Stop Time 1400   PT Time Calculation (min) 34 min   Activity Tolerance Patient tolerated treatment well   Behavior During Therapy [facility] for tasks assessed/performed      Past Medical History:  Diagnosis Date  . Arthritis   . Constipation    OCCASIONAL  . Coronary artery disease   . Diabetes mellitus without complication ([place])   . Environmental allergies   . Hyperlipidemia   . Hypertension   . Inguinal hernia   . Nocturia   . Peripheral arterial disease [facility])     Past Surgical History:  Procedure Laterality Date  . BACK SURGERY    . CARDIAC CATHETERIZATION  05/29/2006   single vessel CAD w/moderate stenosis of the prox. RCA approx 40%,prox. LAD 20-30%,minor irreg. mid abdominal aorta,left iliac artery disease 50-60%  . CATARACT EXTRACTION Right   . HERNIA REPAIR  [id number]  . INGUINAL HERNIA REPAIR N/A 09/23/2012   Procedure: LAPAROSCOPIC INGUINAL HERNIA with umbilical hernia;  Surgeon: Madilyn Hook, DO;  [facility]: [facility];  Service: General;  Laterality: N/A;  . INSERTION OF MESH Bilateral 09/23/2012   Procedure: INSERTION OF MESH;  Surgeon: Madilyn Hook, DO;  Location: [facility];  Service: General;  Laterality: Bilateral;    There were no vitals filed for this visit.      Subjective Assessment - 12/26/16 1329    Subjective The doctor thinks my back is just arthritis.  Back just flared up sometimes.   Patient is accompained by: Family  member   Pertinent History ruptured disk in back (years ago per pt report), OA, DM, CAD, HTN, PAD   Patient Stated Goals Pt's goals are to be able to walk where I want to, when I want to.   Pain Score 5    Pain Location Back   Pain Descriptors / Indicators Aching   Pain Type Chronic pain   Pain Onset More than a month ago   Pain Frequency Intermittent   Aggravating Factors  lying down, first getting up   Pain Relieving Factors walking helps more than anything                         [facility] Adult PT Treatment/Exercise - 12/27/16 1416      Transfers   Transfers Sit to Stand;Stand to Sit   Sit to Stand 6: Modified independent (Device/Increase time);Without upper extremity assist;From chair/3-in-1   Stand to Sit 6: Modified independent (Device/Increase time);Without upper extremity assist;To chair/3-in-1   Number of Reps 10 reps  from 22", 20", 18" surfaces   Transfer Cueing Cues for increased forward lean, upright posture to stand   Comments Practiced sit<>supine, at least 5 reps, with cues provided (verbal and tactile) for log roll technique, for improved ease of transfers and to lessen back pain.  Needs repeated cues to bring hand over to assist with upper trunk roll and push up to  sit.  Discussed use of pillow under or between knees for optimal positioning.           PWR [facility]) - 12/26/16 1341    PWR! exercises Moves in standing   PWR! Up x 10   PWR! Rock x 10 reps each side   PWR! Twist x 10 reps each side  performed at corner, reaching across to wall   PWR Step x 10 reps each side   Comments PT provides mod-max cues for appropriate technique with exercises for positioning, technique and amplitude.  Extra time for cueing      Attempted PWR! Moves twist in standing at counter, but pt has decreased UE coordinated movement and performs better and more balanced at corner, reaching across body.  Cues for stepping to increase foot clearance.  At counter:  Forward  step and weightshifting, alternating legs x 10.  Forward/back step and weightshift x 10 reps each leg.        PT Education - 12/27/16 1421    Education provided Yes   Education Details Bed mobility and positioning technique for optimal positioning to decrease back pain   Person(s) Educated Patient;Child(ren)  daughter   Methods Explanation;Demonstration;Handout;Tactile cues;Verbal cues   Comprehension Verbalized understanding;Returned demonstration;Verbal cues required          PT Short Term Goals - 12/06/16 1900      PT SHORT TERM GOAL #1   Title Pt will be independent with HEP to address Parkinson's disease-related deficits.   Time 4   Period Weeks   Status New   Target Date 01/03/17     PT SHORT TERM GOAL #2   Title Pt will perform at least 8 of 10 reps of sit<>stand from lower than 18 inch surfaces, minimal to no UE support, independently, for improved ease of transfers.   Time 4   Period Weeks   Status New   Target Date 01/03/17     PT SHORT TERM GOAL #3   Title Pt will improveTUG cognitive score to less than or equal to 15 seconds for decreased fall risk.   Time 4   Period Weeks   Status New   Target Date 01/03/17     PT SHORT TERM GOAL #4   Title Pt will improve SLS to at least 3 seconds each leg, for improved obstacle and step negotiation.   Time 4   Period Weeks   Status New   Target Date 01/03/17           PT Long Term Goals - 12/06/16 1903      PT LONG TERM GOAL #1   Title Pt will verbalize understanding of fall prevention in home environment.     Time 6   Period Weeks   Status New   Target Date 01/17/17     PT LONG TERM GOAL #2   Title Pt will improve TUG and TUG cog score to within <10% difference, for improved dual tasking, decreased fall risk.   Time 6   Period Weeks   Status New   Target Date 01/17/17     PT LONG TERM GOAL #3   Title Pt will improve MiniBESTest score to at least 23/28 for decreased fall risk.   Time 6   Period  Weeks   Status New   Target Date 01/17/17     PT LONG TERM GOAL #4   Title Pt will verbalize plans for continued community fitness upon d/c from PT.   Time 6  Period Weeks   Status New   Target Date 01/17/17               Plan - 12/27/16 1422    Clinical Impression Statement Pt continues to need assistance for proper performance of standing PWR! Moves exercises.  Worked on transfers and bed mobility this visit as well, with pt having no c/o back pain at end of session.  Pt will continue to benefit from skilled PT to address posture, balance and gait.   Rehab Potential Good   PT Frequency 2x / week   PT Duration 6 weeks  plus eval   PT Treatment/Interventions ADLs/Self Care Home Management;Functional mobility training;Gait training;Therapeutic activities;Therapeutic exercise;Balance training;Neuromuscular re-education;Patient/family education   PT Next Visit Plan Continue to work on [place]! Moves, compliant surface balance activities and low surface sit<>stand activities; ask about bed mobility   Consulted and Agree with Plan of Care Patient;Family member/caregiver   Family Member Consulted daughter      Patient will benefit from skilled therapeutic intervention in order to improve the following deficits and impairments:  Abnormal gait, Decreased balance, Decreased mobility, Decreased strength, Difficulty walking, Postural dysfunction, Impaired tone  Visit Diagnosis: Unsteadiness on feet  Other symptoms and signs involving the nervous system     Problem List Patient Active Problem List   Diagnosis Date Noted  . PD (Parkinson's disease) ([place]) 12/18/2015  . Tremor of both hands 11/22/2015  . PAD (peripheral artery disease) s/p remote left external iliac artery angioplasty 10/28/2012  . Coronary atherosclerosis 10/28/2012  . Hyperlipidemia 10/28/2012  . HTN (hypertension) 10/28/2012  . DM2 (diabetes mellitus, type 2) ([place]) 10/28/2012  . Carotid occlusion, right  10/28/2012    Liesel Peckenpaugh W. 12/27/2016, 2:24 PM  Frazier Butt., PT   [facility] [address] [place], [place], [address] Phone: [phone]   Fax:  [phone]  Name: ADLAI SINNING MRN: [id number] Date of Birth: [birth year]

## 2016-12-30 DIAGNOSIS — H2181 Floppy iris syndrome: Secondary | ICD-10-CM | POA: Diagnosis not present

## 2016-12-30 DIAGNOSIS — H5703 Miosis: Secondary | ICD-10-CM | POA: Diagnosis not present

## 2016-12-30 DIAGNOSIS — H2512 Age-related nuclear cataract, left eye: Secondary | ICD-10-CM | POA: Diagnosis not present

## 2016-12-30 HISTORY — PX: CATARACT EXTRACTION: SUR2

## 2016-12-31 ENCOUNTER — Ambulatory Visit: Payer: Medicare Other | Admitting: Physical Therapy

## 2017-01-02 ENCOUNTER — Ambulatory Visit: Payer: Medicare Other | Admitting: Physical Therapy

## 2017-01-02 ENCOUNTER — Encounter: Payer: Medicare Other | Admitting: Occupational Therapy

## 2017-01-07 ENCOUNTER — Encounter: Payer: Medicare Other | Admitting: Occupational Therapy

## 2017-01-07 ENCOUNTER — Ambulatory Visit: Payer: Medicare Other | Admitting: Physical Therapy

## 2017-01-09 ENCOUNTER — Encounter: Payer: Medicare Other | Admitting: Occupational Therapy

## 2017-01-09 ENCOUNTER — Ambulatory Visit: Payer: Medicare Other | Admitting: Physical Therapy

## 2017-01-14 ENCOUNTER — Encounter: Payer: Self-pay | Admitting: Physical Therapy

## 2017-01-14 ENCOUNTER — Ambulatory Visit: Payer: Medicare Other | Attending: Internal Medicine

## 2017-01-14 ENCOUNTER — Ambulatory Visit: Payer: Medicare Other | Admitting: Physical Therapy

## 2017-01-14 ENCOUNTER — Ambulatory Visit: Payer: Medicare Other | Admitting: Occupational Therapy

## 2017-01-14 DIAGNOSIS — R471 Dysarthria and anarthria: Secondary | ICD-10-CM | POA: Diagnosis not present

## 2017-01-14 DIAGNOSIS — R2681 Unsteadiness on feet: Secondary | ICD-10-CM | POA: Diagnosis not present

## 2017-01-14 DIAGNOSIS — R29818 Other symptoms and signs involving the nervous system: Secondary | ICD-10-CM | POA: Insufficient documentation

## 2017-01-14 DIAGNOSIS — R2689 Other abnormalities of gait and mobility: Secondary | ICD-10-CM | POA: Insufficient documentation

## 2017-01-14 DIAGNOSIS — R293 Abnormal posture: Secondary | ICD-10-CM | POA: Insufficient documentation

## 2017-01-14 DIAGNOSIS — R278 Other lack of coordination: Secondary | ICD-10-CM

## 2017-01-14 DIAGNOSIS — M25612 Stiffness of left shoulder, not elsewhere classified: Secondary | ICD-10-CM | POA: Insufficient documentation

## 2017-01-14 DIAGNOSIS — R29898 Other symptoms and signs involving the musculoskeletal system: Secondary | ICD-10-CM | POA: Insufficient documentation

## 2017-01-14 NOTE — Therapy (Signed)
[facility] [address] [place], [place], [address] Phone: [phone]   Fax:  [phone]  Physical Therapy Treatment  Patient Details  Name: Rodney Love MRN: [id number] Date of Birth: 05-Jun-1945 Referring Provider: Alonza Bogus  Encounter Date: 01/14/2017      PT End of Session - 01/14/17 1706    Visit Number 5   Number of Visits 13   Date for PT Re-Evaluation 02/03/17   Authorization Type UHC Medicare-GCODE every 10th visit   PT Start Time 1502   PT Stop Time 1530   PT Time Calculation (min) 28 min   Activity Tolerance Patient tolerated treatment well   Behavior During Therapy [facility] for tasks assessed/performed      Past Medical History:  Diagnosis Date  . Arthritis   . Constipation    OCCASIONAL  . Coronary artery disease   . Diabetes mellitus without complication ([place])   . Environmental allergies   . Hyperlipidemia   . Hypertension   . Inguinal hernia   . Nocturia   . Peripheral arterial disease [facility])     Past Surgical History:  Procedure Laterality Date  . BACK SURGERY    . CARDIAC CATHETERIZATION  05/29/2006   single vessel CAD w/moderate stenosis of the prox. RCA approx 40%,prox. LAD 20-30%,minor irreg. mid abdominal aorta,left iliac artery disease 50-60%  . CATARACT EXTRACTION Right   . HERNIA REPAIR  [id number]  . INGUINAL HERNIA REPAIR N/A 09/23/2012   Procedure: LAPAROSCOPIC INGUINAL HERNIA with umbilical hernia;  Surgeon: Madilyn Hook, DO;  [facility];  Service: General;  Laterality: N/A;  . INSERTION OF MESH Bilateral 09/23/2012   Procedure: INSERTION OF MESH;  Surgeon: Madilyn Hook, DO;  [facility];  Service: General;  Laterality: Bilateral;    There were no vitals filed for this visit.      Subjective Assessment - 01/14/17 1714    Subjective Reports he missed several weeks of appointments due to cataract surgery. Reports no restrictions. Reports bed mobility is going  much better because he bought a new (firm) mattress.    Patient is accompained by: Family member   Pertinent History ruptured disk in back (years ago per pt report), OA, DM, CAD, HTN, PAD   Patient Stated Goals Pt's goals are to be able to walk where I want to, when I want to.   Currently in Pain? No/denies   Pain Onset More than a month ago                         [facility] Adult PT Treatment/Exercise - 01/14/17 1715      Transfers   Transfers Sit to Stand;Stand to Sit   Sit to Stand 6: Modified independent (Device/Increase time);Without upper extremity assist;From chair/3-in-1   Stand to Sit 6: Modified independent (Device/Increase time);Without upper extremity assist;To chair/3-in-1     Ambulation/Gait   Ambulation/Gait Assistance 5: Supervision   Ambulation/Gait Assistance Details vc for incr stride length, heel strike, and velocity with minimal incr in arm swing   Ambulation Distance (Feet) 180 Feet   Assistive device None   Gait Pattern Step-through pattern;Decreased arm swing - left;Decreased step length - left;Decreased arm swing - right;Decreased trunk rotation   Ambulation Surface Level     Standardized Balance Assessment   Standardized Balance Assessment Timed Up and Go Test     Timed Up and Go Test   TUG Normal TUG;Cognitive TUG   Normal TUG (seconds)  10.03   Cognitive TUG (seconds) 14.04  subtracting by 3's with 2 mistakes           PWR [facility]) - 01/14/17 1721    PWR! exercises Moves in standing   PWR! Rock 20   PWR! Step 20   Comments on red mat with minguard assist for safety; no lOB          Balance Exercises - 01/14/17 1716      Balance Exercises: Standing   Standing Eyes Opened Narrow base of support (BOS);Wide (BOA);Head turns;Foam/compliant surface;30 secs  horizontal and vertical   Standing Eyes Closed Wide (BOA);Foam/compliant surface;2 reps;20 secs   Tandem Stance Eyes open;Upper extremity support 1;4 reps  2 each leg   SLS  Eyes open;3 reps  each leg with longest RLE 4.13 sec, LLE 4.17 sec   SLS with Vectors Foam/compliant surface  red mat, no UE support, single &double cone taps   Stepping Strategy Posterior;Foam/compliant surface;10 reps   Partial Tandem Stance Eyes open;Eyes closed;Foam/compliant surface;3 reps   Marching Limitations on red mat; on flue mat on ramp (up and down facing)             PT Short Term Goals - 01/14/17 1729      PT SHORT TERM GOAL #1   Title Pt will be independent with HEP to address Parkinson's disease-related deficits.   Baseline 01/14/17-pt returned after missing 2+weeks due to cataract surgery. Reports he is "just getting back into doing" HEP   Time 4   Period Weeks   Status Not Met     PT SHORT TERM GOAL #2   Title Pt will perform at least 8 of 10 reps of sit<>stand from lower than 18 inch surfaces, minimal to no UE support, independently, for improved ease of transfers.   Time 4   Period Weeks   Status Achieved     PT SHORT TERM GOAL #3   Title Pt will improveTUG cognitive score to less than or equal to 15 seconds for decreased fall risk.   Baseline 10/16  14.04 sec   Time 4   Period Weeks   Status Achieved     PT SHORT TERM GOAL #4   Title Pt will improve SLS to at least 3 seconds each leg, for improved obstacle and step negotiation.   Baseline 10/16 RLE 4.13 sec, LLE 4.17 sec   Time 4   Period Weeks   Status Achieved           PT Long Term Goals - 12/06/16 1903      PT LONG TERM GOAL #1   Title Pt will verbalize understanding of fall prevention in home environment.     Time 6   Period Weeks   Status New   Target Date 01/17/17     PT LONG TERM GOAL #2   Title Pt will improve TUG and TUG cog score to within <10% difference, for improved dual tasking, decreased fall risk.   Time 6   Period Weeks   Status New   Target Date 01/17/17     PT LONG TERM GOAL #3   Title Pt will improve MiniBESTest score to at least 23/28 for decreased fall  risk.   Time 6   Period Weeks   Status New   Target Date 01/17/17     PT LONG TERM GOAL #4   Title Pt will verbalize plans for continued community fitness upon d/c from PT.   Time 6  Period Weeks   Status New   Target Date 01/17/17               Plan - 01/14/17 1724    Clinical Impression Statement Session began 15 minutes late due to miscommunication (pt only signed in for OT, SLP on arrival; then was marked as a no-show for PT when he was actually present in clinic). Session focused on checking STGs, balance training (including performing standing PWR! exercises on compliant surface), and gait training. Patient met 3 of 4 STGs with 4th goal not met (was unable to exercise after eye surgery and has not returned to doing his HEP as of yet). Educated to begin to resume his HEP.    Rehab Potential Good   PT Frequency 2x / week   PT Duration 6 weeks  plus eval   PT Treatment/Interventions ADLs/Self Care Home Management;Functional mobility training;Gait training;Therapeutic activities;Therapeutic exercise;Balance training;Neuromuscular re-education;Patient/family education   PT Next Visit Plan Continue to work on [place]! Moves, compliant surface balance activities and low surface sit<>stand activities; ?need to coordinate with OT/SLP if he will need re-cert due to missing 2 weeks of therapy due to eye surgery   Consulted and Agree with Plan of Care Patient;Family member/caregiver   Family Member Consulted daughter      Patient will benefit from skilled therapeutic intervention in order to improve the following deficits and impairments:  Abnormal gait, Decreased balance, Decreased mobility, Decreased strength, Difficulty walking, Postural dysfunction, Impaired tone  Visit Diagnosis: Other symptoms and signs involving the nervous system  Other abnormalities of gait and mobility  Unsteadiness on feet     Problem List Patient Active Problem List   Diagnosis Date Noted  . PD  (Parkinson's disease) ([place]) 12/18/2015  . Tremor of both hands 11/22/2015  . PAD (peripheral artery disease) s/p remote left external iliac artery angioplasty 10/28/2012  . Coronary atherosclerosis 10/28/2012  . Hyperlipidemia 10/28/2012  . HTN (hypertension) 10/28/2012  . DM2 (diabetes mellitus, type 2) ([place]) 10/28/2012  . Carotid occlusion, right 10/28/2012    Rexanne Mano, PT 01/14/2017, 9:45 PM  [facility] [address] [place], [place], [address] Phone: [phone]   Fax:  [phone]  Name: Rodney Love MRN: [id number] Date of Birth: Nov 02, 1945

## 2017-01-14 NOTE — Patient Instructions (Addendum)
   Use "HEYYYYYYYY!  [place]!" 5-7 times, twice a day  ==========================================================================================   Signs of Aspiration Pneumonia   . Chest pain/tightness . Fever (can be low grade) . Cough  o With foul-smelling phlegm (sputum) o With sputum containing pus or blood o With greenish sputum . Fatigue  . Shortness of breath  . Wheezing   **IF YOU HAVE THESE SIGNS, CONTACT YOUR DOCTOR OR GO TO THE EMERGENCY DEPARTMENT OR URGENT CARE AS SOON AS POSSIBLE**
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
## 2017-01-14 NOTE — Therapy (Signed)
[facility] [place] [address] [place], [place], [address] Phone: [phone]   Fax:  [phone]  Occupational Therapy Treatment  Patient Details  Name: Rodney Love MRN: [id number] Date of Birth: Jul 24, 1945 Referring Provider: Dr. Wells Guiles Tat   Encounter Date: 01/14/2017      OT End of Session - 01/14/17 1434    Visit Number 4   Number of Visits 17   Date for OT Re-Evaluation 02/03/17   Authorization Type [employer] Medicare, no visit limit/no auth; G-code needed   Authorization - Visit Number 4   Authorization - Number of Visits 10   OT Start Time 4982   OT Stop Time 1445   OT Time Calculation (min) 40 min   Activity Tolerance Patient tolerated treatment well   Behavior During Therapy [facility] for tasks assessed/performed      Past Medical History:  Diagnosis Date  . Arthritis   . Constipation    OCCASIONAL  . Coronary artery disease   . Diabetes mellitus without complication ([place])   . Environmental allergies   . Hyperlipidemia   . Hypertension   . Inguinal hernia   . Nocturia   . Peripheral arterial disease [facility])     Past Surgical History:  Procedure Laterality Date  . BACK SURGERY    . CARDIAC CATHETERIZATION  05/29/2006   single vessel CAD w/moderate stenosis of the prox. RCA approx 40%,prox. LAD 20-30%,minor irreg. mid abdominal aorta,left iliac artery disease 50-60%  . CATARACT EXTRACTION Right   . HERNIA REPAIR  [id number]  . INGUINAL HERNIA REPAIR N/A 09/23/2012   Procedure: LAPAROSCOPIC INGUINAL HERNIA with umbilical hernia;  Surgeon: Madilyn Hook, DO;  Location: [facility];  Service: General;  Laterality: N/A;  . INSERTION OF MESH Bilateral 09/23/2012   Procedure: INSERTION OF MESH;  Surgeon: Madilyn Hook, DO;  Location: [facility];  Service: General;  Laterality: Bilateral;    There were no vitals filed for this visit.      Subjective Assessment - 01/14/17 1407    Subjective  Had L cataract surgery 10/1--no  restrictions   Pertinent History Parkinson's disease; PAD, HTN, DM, hyperlipedemia, hx of low back pain   Limitations fall risk   Patient Stated Goals improve buttoning, donning shoes/socks   Currently in Pain? No/denies   Pain Onset More than a month ago        Arm bike x54min level 1 for reciprocal movement with cues/target of at least 40-45rpms for intensity while maintaining movement amplitude/reciprocal movement.   Pt maintained 40rpms.  Flipping cards with each hand with min cues for R hand and mod cueing for L hand for incr movement amplitude.  Emphasized importance of big movement/effort for exercise to promote brain change and prevent future complications.                         OT Education - 01/14/17 1434    Education Details PWR! moves (basic 4) in sitting   Person(s) Educated Patient   Methods Explanation;Demonstration;Verbal cues;Tactile cues  has handout at home   Comprehension Verbalized understanding;Returned demonstration;Verbal cues required;Need further instruction  min-mod cueing for technique and incr movement amplitude          OT Short Term Goals - 12/05/16 1623      OT SHORT TERM GOAL #1   Title Pt will be independent with updated HEP.--check 01/02/17   Time 4   Period Weeks   Status New  OT SHORT TERM GOAL #2   Title Pt will improve L hand coordination for ADLs as shown by improving time on 9-hole peg test by at least 5sec.   Baseline L-43.91sec   Time 4   Period Weeks   Status New     OT SHORT TERM GOAL #3   Title Pt will be able to don shoes/sock mod I.   Baseline needs assist   Time 4   Period Weeks   Status New     OT SHORT TERM GOAL #4   Title Pt will verbalize understanding of appropriate community resources and ways to prevent future complications related to PD.   Baseline -----   Time 4   Period Weeks   Status New           OT Long Term Goals - 12/05/16 1633      OT LONG TERM GOAL #1   Title Pt  will verbalize understanding of adaptive strategies for ADLs/IADLs to incr ease, incr safety, and decr risk of pain.--check LTGs 02/03/17   Time 8   Period Weeks   Status New     OT LONG TERM GOAL #2   Title Pt will improve bilateral functional reaching/coordination as shown by improving score on box and blocks test by at least 3 with RUE and at least 5 with LUE>   Baseline R-47 blocks, L-41 blocks   Time 8   Period Weeks   Status New     OT LONG TERM GOAL #3   Title Pt will be able to fasten/unfasten 3 buttons in less than 60sec.   Baseline ---   Time 8   Period Weeks   Status New     OT LONG TERM GOAL #4   Title Pt will improve balance/functional reaching for ADLs/IADLs as shown by improving standing functional reach to 10 inches or greater bilaterally   Baseline R-9", L-9.5"   Time 8   Period Weeks   Status New     OT LONG TERM GOAL #5   Title Pt will improve L hand coordination for ADLs as shown by improving time on 9-hole peg test by at least 10sec.   Baseline 43.91sec   Time 8   Period Weeks   Status New     Long Term Additional Goals   Additional Long Term Goals --               Plan - 01/14/17 1408    Clinical Impression Statement Pt is progressing towards goals.  Pt needs min-mod cueing for large amplitude movements/technique for sitting PWR! moves.   Rehab Potential Good   OT Frequency 2x / week   OT Duration 8 weeks   Plan review sitting PWR! moves   Consulted and Agree with Plan of Care Patient;Family member/caregiver   Family Member Consulted daughter      Patient will benefit from skilled therapeutic intervention in order to improve the following deficits and impairments:  Decreased balance, Decreased mobility, Decreased activity tolerance, Decreased coordination, Decreased knowledge of precautions, Decreased safety awareness, Impaired tone, Improper body mechanics, Impaired UE functional use, Decreased range of motion, Decreased knowledge of use  of DME  Visit Diagnosis: Other symptoms and signs involving the nervous system  Abnormal posture  Other symptoms and signs involving the musculoskeletal system  Other lack of coordination  Other abnormalities of gait and mobility  Stiffness of left shoulder, not elsewhere classified  Unsteadiness on feet  Stiffness of joint, shoulder region, left  Problem List Patient Active Problem List   Diagnosis Date Noted  . PD (Parkinson's disease) ([place]) 12/18/2015  . Tremor of both hands 11/22/2015  . PAD (peripheral artery disease) s/p remote left external iliac artery angioplasty 10/28/2012  . Coronary atherosclerosis 10/28/2012  . Hyperlipidemia 10/28/2012  . HTN (hypertension) 10/28/2012  . DM2 (diabetes mellitus, type 2) ([place]) 10/28/2012  . Carotid occlusion, right 10/28/2012    [facility] 01/14/2017, 2:52 PM  [place] [address] [place], [place], [address] Phone: [phone]   Fax:  [phone]  Name: NEYLAND PETTENGILL MRN: [id number] Date of Birth: 01-28-1946   Vianne Bulls, OTR/L [facility] [address] [place], [place]  [address] [phone] phone [phone] 01/14/17 2:52 PM

## 2017-01-16 ENCOUNTER — Ambulatory Visit: Payer: Medicare Other | Admitting: Occupational Therapy

## 2017-01-16 ENCOUNTER — Ambulatory Visit: Payer: Medicare Other

## 2017-01-16 ENCOUNTER — Ambulatory Visit: Payer: Medicare Other | Admitting: Physical Therapy

## 2017-01-16 DIAGNOSIS — R29818 Other symptoms and signs involving the nervous system: Secondary | ICD-10-CM

## 2017-01-16 DIAGNOSIS — R2681 Unsteadiness on feet: Secondary | ICD-10-CM

## 2017-01-16 DIAGNOSIS — R29898 Other symptoms and signs involving the musculoskeletal system: Secondary | ICD-10-CM

## 2017-01-16 DIAGNOSIS — R278 Other lack of coordination: Secondary | ICD-10-CM

## 2017-01-16 DIAGNOSIS — M25612 Stiffness of left shoulder, not elsewhere classified: Secondary | ICD-10-CM | POA: Diagnosis not present

## 2017-01-16 DIAGNOSIS — R471 Dysarthria and anarthria: Secondary | ICD-10-CM | POA: Diagnosis not present

## 2017-01-16 DIAGNOSIS — R293 Abnormal posture: Secondary | ICD-10-CM | POA: Diagnosis not present

## 2017-01-16 DIAGNOSIS — R2689 Other abnormalities of gait and mobility: Secondary | ICD-10-CM

## 2017-01-16 NOTE — Patient Instructions (Signed)
  Please complete the assigned speech therapy homework prior to your next session and return it to the speech therapist at your next visit.  

## 2017-01-16 NOTE — Therapy (Signed)
[place] [address] [place], [place], [address] Phone: [phone]   Fax:  [phone]  Speech Language Pathology Treatment  Patient Details  Name: Rodney Love MRN: [id number] Date of Birth: 22-Jan-1946 Referring Provider: Alonza Bogus, D.O.  Encounter Date: 01/16/2017      End of Session - 01/16/17 1531    Visit Number 5   Number of Visits 17   Date for SLP Re-Evaluation 02/14/17   SLP Start Time 1450   SLP Stop Time  1530   SLP Time Calculation (min) 40 min   Activity Tolerance Patient tolerated treatment well      Past Medical History:  Diagnosis Date  . Arthritis   . Constipation    OCCASIONAL  . Coronary artery disease   . Diabetes mellitus without complication ([place])   . Environmental allergies   . Hyperlipidemia   . Hypertension   . Inguinal hernia   . Nocturia   . Peripheral arterial disease [facility])     Past Surgical History:  Procedure Laterality Date  . BACK SURGERY    . CARDIAC CATHETERIZATION  05/29/2006   single vessel CAD w/moderate stenosis of the prox. RCA approx 40%,prox. LAD 20-30%,minor irreg. mid abdominal aorta,left iliac artery disease 50-60%  . CATARACT EXTRACTION Right   . HERNIA REPAIR  [id number]  . INGUINAL HERNIA REPAIR N/A 09/23/2012   Procedure: LAPAROSCOPIC INGUINAL HERNIA with umbilical hernia;  Surgeon: Madilyn Hook, DO;  [facility];  Service: General;  Laterality: N/A;  . INSERTION OF MESH Bilateral 09/23/2012   Procedure: INSERTION OF MESH;  Surgeon: Madilyn Hook, DO;  [facility];  Service: General;  Laterality: Bilateral;    There were no vitals filed for this visit.      Subjective Assessment - 01/16/17 1501    Subjective Pt arrives today with his daughter.   Patient is accompained by: Family member  daughter   Currently in Pain? No/denies               ADULT SLP TREATMENT - 01/16/17 1504      General Information   Behavior/Cognition  Alert;Pleasant mood;Cooperative     Treatment Provided   Treatment provided Cognitive-Linquistic     Cognitive-Linquistic Treatment   Treatment focused on Dysarthria   Skilled Treatment SLP used loud /a/ (with "heyyyyyyyyy! Ahhhhhh" to recalibrate conversational loudness to WNL. Initial cues to hold out the "Hey!" When pt did loud /a/ by this means, WNL voicing heard, and not strained. Average loudness in mid to upper 80s. SLP engaged pt with sentence tasks (mod complex - sentence generation using unrelated words) and pt req'd mod cues usually for sentence construction. Average loudness low 70s dB with occasional mod cues (pt challenged with linguistic complexity of the task). Conversation between tasks completed with usual min-mod A for loudness.      Assessment / Recommendations / Plan   Plan Continue with current plan of care     Progression Toward Goals   Progression toward goals Progressing toward goals            SLP Short Term Goals - 01/16/17 1533      SLP SHORT TERM GOAL #1   Title Pt will average 90dB on loud /a/ with rare min A over 5 sessions   Baseline 12-26-16, 01-14-17   Time 2   Period Weeks   Status On-going     SLP SHORT TERM GOAL #2   Title Pt will average in low  70s dB on 90% of sentence level structured speech tasks with rare min A, over 3 sessions   Baseline 12-27-26 01-14-17   Time 2   Period Weeks   Status On-going     SLP SHORT TERM GOAL #3   Title Pt will maintain average of 70dB during 8 minute simple conversation with rare min A over three sessions   Time 2   Period Weeks   Status On-going     SLP SHORT TERM GOAL #4   Title pt will demo knowledge of overt s/s dysphagia with meals with modified independence   Time 2   Period Weeks   Status On-going          SLP Long Term Goals - 01/16/17 1534      SLP LONG TERM GOAL #1   Title Pt will maintain average of 70dB during 15 minute moderately complex conversation with rare min A over two  sessions   Time 6   Period Weeks   Status On-going     SLP LONG TERM GOAL #2   Title Pt. will maintain audible conversation over 12 minutes in noisy environment outside of therapy room over two sessions   Time 6   Period Weeks   Status On-going          Plan - 01/16/17 1532    Clinical Impression Statement Pt exhibiting WNL loudness with simple and simple- mod complex phrase and sentence responses without cues from SLP. Mod complex responses req'd SLP cues for loudness due to linguistic complexity. Shaping pt's loud /a/ today by using loud extended "hey" first was very helpful in keeping WNL voicing. Pt WNL loudness did not transfer as easily as Tuesday, into simple conversation. Skilled ST focusing on speech loudness and breath support in conversation, across speaking situations.   Speech Therapy Frequency 2x / week   Duration --  8 weeks   Treatment/Interventions Compensatory strategies;Patient/family education;Functional tasks;Cueing hierarchy;Compensatory techniques;Internal/external aids;SLP instruction and feedback   Potential to Achieve Goals Good      Patient will benefit from skilled therapeutic intervention in order to improve the following deficits and impairments:   Dysarthria and anarthria    Problem List Patient Active Problem List   Diagnosis Date Noted  . PD (Parkinson's disease) ([place]) 12/18/2015  . Tremor of both hands 11/22/2015  . PAD (peripheral artery disease) s/p remote left external iliac artery angioplasty 10/28/2012  . Coronary atherosclerosis 10/28/2012  . Hyperlipidemia 10/28/2012  . HTN (hypertension) 10/28/2012  . DM2 (diabetes mellitus, type 2) ([place]) 10/28/2012  . Carotid occlusion, right 10/28/2012    [facility] ,MS, CCC-SLP  01/16/2017, 3:34 PM  [place] [address] [place], [place], [address] Phone: [phone]   Fax:  [phone]   Name: Rodney Love MRN:  [id number] Date of Birth: 06-25-1945

## 2017-01-16 NOTE — Therapy (Signed)
[facility] [address] [place], [place], [address] Phone: [phone]   Fax:  [phone]  Occupational Therapy Treatment  Patient Details  Name: Rodney Love MRN: [id number] Date of Birth: December 13, 1945 Referring Provider: Dr. Wells Guiles Tat   Encounter Date: 01/16/2017      OT End of Session - 01/16/17 1605    Visit Number 5   Number of Visits 17   Date for OT Re-Evaluation 02/03/17   Authorization Type [employer] Medicare, no visit limit/no auth; G-code needed   Authorization - Visit Number 5   Authorization - Number of Visits 10   OT Start Time 1406   OT Stop Time 1446   OT Time Calculation (min) 40 min   Activity Tolerance Patient tolerated treatment well   Behavior During Therapy [facility] for tasks assessed/performed      Past Medical History:  Diagnosis Date  . Arthritis   . Constipation    OCCASIONAL  . Coronary artery disease   . Diabetes mellitus without complication ([place])   . Environmental allergies   . Hyperlipidemia   . Hypertension   . Inguinal hernia   . Nocturia   . Peripheral arterial disease [facility])     Past Surgical History:  Procedure Laterality Date  . BACK SURGERY    . CARDIAC CATHETERIZATION  05/29/2006   single vessel CAD w/moderate stenosis of the prox. RCA approx 40%,prox. LAD 20-30%,minor irreg. mid abdominal aorta,left iliac artery disease 50-60%  . CATARACT EXTRACTION Right   . HERNIA REPAIR  [id number]  . INGUINAL HERNIA REPAIR N/A 09/23/2012   Procedure: LAPAROSCOPIC INGUINAL HERNIA with umbilical hernia;  Surgeon: Madilyn Hook, DO;  [facility];  Service: General;  Laterality: N/A;  . INSERTION OF MESH Bilateral 09/23/2012   Procedure: INSERTION OF MESH;  Surgeon: Madilyn Hook, DO;  [facility];  Service: General;  Laterality: Bilateral;    There were no vitals filed for this visit.      Subjective Assessment - 01/16/17 1406    Subjective  doing ok   Patient is  accompained by: Family member   Pertinent History Parkinson's disease; PAD, HTN, DM, hyperlipedemia, hx of low back pain   Limitations fall risk   Patient Stated Goals improve buttoning, donning shoes/socks   Currently in Pain? No/denies   Pain Onset More than a month ago      Reviewed/encouraged pt to utilize PD exercise chart (previously issued) and reviewed how to use.  Also began organizing notebook with chart and HEP together for incr ease/decr confusion, but unable to complete due to time constraints.    Donning/doffing L shoe/sock mod I, min difficulty.  Educated pt/dtr in tips for incr ease with LB dressing including stretching prior and/or adjusting morning routine slightly to dress after medications.  Pt verbalized understanding.   Supine>sitting with min cues for use of large amplitude movement strategy to incr ease and decr stress on back to prevent pain.  Pt verbalized understanding and returned demo after cues.                    OT Education - 01/16/17 1610    Education Details PWR! moves (up, rock, step) in sitting and basic 4 in supine--returned demo each x10-20   Person(s) Educated Patient;Child(ren)   Methods Demonstration;Explanation;Verbal cues;Tactile cues  pt has handouts   Comprehension Verbalized understanding;Returned demonstration;Verbal cues required  min cueing for large amplitude, min-mod cueing for sequence of step and technique  OT Short Term Goals - 01/16/17 1614      OT SHORT TERM GOAL #1   Title Pt will be independent with updated HEP.--check 01/24/17   Time 4   Period Weeks   Status New     OT SHORT TERM GOAL #2   Title Pt will improve L hand coordination for ADLs as shown by improving time on 9-hole peg test by at least 5sec.   Baseline L-43.91sec   Time 4   Period Weeks   Status New     OT SHORT TERM GOAL #3   Title Pt will be able to don shoes/sock mod I.   Baseline needs assist   Time 4   Period Weeks    Status New     OT SHORT TERM GOAL #4   Title Pt will verbalize understanding of appropriate community resources and ways to prevent future complications related to PD.   Baseline -----   Time 4   Period Weeks   Status New           OT Long Term Goals - 12/05/16 1633      OT LONG TERM GOAL #1   Title Pt will verbalize understanding of adaptive strategies for ADLs/IADLs to incr ease, incr safety, and decr risk of pain.--check LTGs 02/03/17   Time 8   Period Weeks   Status New     OT LONG TERM GOAL #2   Title Pt will improve bilateral functional reaching/coordination as shown by improving score on box and blocks test by at least 3 with RUE and at least 5 with LUE>   Baseline R-47 blocks, L-41 blocks   Time 8   Period Weeks   Status New     OT LONG TERM GOAL #3   Title Pt will be able to fasten/unfasten 3 buttons in less than 60sec.   Baseline ---   Time 8   Period Weeks   Status New     OT LONG TERM GOAL #4   Title Pt will improve balance/functional reaching for ADLs/IADLs as shown by improving standing functional reach to 10 inches or greater bilaterally   Baseline R-9", L-9.5"   Time 8   Period Weeks   Status New     OT LONG TERM GOAL #5   Title Pt will improve L hand coordination for ADLs as shown by improving time on 9-hole peg test by at least 10sec.   Baseline 43.91sec   Time 8   Period Weeks   Status New     Long Term Additional Goals   Additional Long Term Goals --               Plan - 01/16/17 1612    Clinical Impression Statement Pt is progressing towards goals.    Pt demo improved movement amplitude today, but demo difficulty with sequencing PWR! step in sitting/supine.   Rehab Potential Good   OT Frequency 2x / week   OT Duration 8 weeks   Plan help pt organize notebook to incr ease with exercise chart; performing ADLs with large amplitude, PWR! hands; (extended STGs due to missed visits)   Consulted and Agree with Plan of Care  Patient;Family member/caregiver   Family Member Consulted daughter      Patient will benefit from skilled therapeutic intervention in order to improve the following deficits and impairments:  Decreased balance, Decreased mobility, Decreased activity tolerance, Decreased coordination, Decreased knowledge of precautions, Decreased safety awareness, Impaired tone, Improper body mechanics,  Impaired UE functional use, Decreased range of motion, Decreased knowledge of use of DME  Visit Diagnosis: Other symptoms and signs involving the nervous system  Other abnormalities of gait and mobility  Unsteadiness on feet  Abnormal posture  Other symptoms and signs involving the musculoskeletal system  Other lack of coordination  Stiffness of left shoulder, not elsewhere classified    Problem List Patient Active Problem List   Diagnosis Date Noted  . PD (Parkinson's disease) ([place]) 12/18/2015  . Tremor of both hands 11/22/2015  . PAD (peripheral artery disease) s/p remote left external iliac artery angioplasty 10/28/2012  . Coronary atherosclerosis 10/28/2012  . Hyperlipidemia 10/28/2012  . HTN (hypertension) 10/28/2012  . DM2 (diabetes mellitus, type 2) ([place]) 10/28/2012  . Carotid occlusion, right 10/28/2012    [facility] 01/16/2017, 4:19 PM  [place] [address] [place], [place], [address] Phone: [phone]   Fax:  [phone]  Name: BAKARY BRAMER MRN: [id number] Date of Birth: [birth year]   Vianne Bulls, OTR/L [facility] [address] [place], [place]  [address] [phone] phone [phone] 01/16/17 4:19 PM

## 2017-01-17 NOTE — Therapy (Signed)
[facility] [address] [place], [place], [address] Phone: [phone]   Fax:  [phone]  Physical Therapy Treatment  Patient Details  Name: Rodney Love MRN: [id number] Date of Birth: 1945-12-25 Referring Provider: Alonza Bogus  Encounter Date: 01/16/2017      PT End of Session - 01/17/17 2210    Visit Number 6   Number of Visits 13   Date for PT Re-Evaluation 02/03/17   Authorization Type UHC Medicare-GCODE every 10th visit   PT Start Time 1320   PT Stop Time 1401   PT Time Calculation (min) 41 min   Activity Tolerance Patient tolerated treatment well   Behavior During Therapy [facility] for tasks assessed/performed      Past Medical History:  Diagnosis Date  . Arthritis   . Constipation    OCCASIONAL  . Coronary artery disease   . Diabetes mellitus without complication ([place])   . Environmental allergies   . Hyperlipidemia   . Hypertension   . Inguinal hernia   . Nocturia   . Peripheral arterial disease [facility])     Past Surgical History:  Procedure Laterality Date  . BACK SURGERY    . CARDIAC CATHETERIZATION  05/29/2006   single vessel CAD w/moderate stenosis of the prox. RCA approx 40%,prox. LAD 20-30%,minor irreg. mid abdominal aorta,left iliac artery disease 50-60%  . CATARACT EXTRACTION Right   . HERNIA REPAIR  [id number]  . INGUINAL HERNIA REPAIR N/A 09/23/2012   Procedure: LAPAROSCOPIC INGUINAL HERNIA with umbilical hernia;  Surgeon: Madilyn Hook, DO;  [facility]: [facility];  Service: General;  Laterality: N/A;  . INSERTION OF MESH Bilateral 09/23/2012   Procedure: INSERTION OF MESH;  Surgeon: Madilyn Hook, DO;  Location: [facility];  Service: General;  Laterality: Bilateral;    There were no vitals filed for this visit.      Subjective Assessment - 01/17/17 2202    Subjective Getting back into walking and exercise bike this week.   Patient is accompained by: Family member   Pertinent History ruptured  disk in back (years ago per pt report), OA, DM, CAD, HTN, PAD   Patient Stated Goals Pt's goals are to be able to walk where I want to, when I want to.   Currently in Pain? No/denies   Pain Onset More than a month ago                         [facility] Adult PT Treatment/Exercise - 01/17/17 2202      Transfers   Transfers Sit to Stand;Stand to Sit   Sit to Stand 6: Modified independent (Device/Increase time);Without upper extremity assist;From bed;From chair/3-in-1   Stand to Sit 6: Modified independent (Device/Increase time);Without upper extremity assist;To chair/3-in-1;To bed   Number of Reps --  5 reps 2 sets   Transfer Cueing Cues for upright posture upon standing     Ambulation/Gait   Ambulation/Gait Yes   Ambulation/Gait Assistance 5: Supervision   Ambulation/Gait Assistance Details VCs for increased step length and arm swing   Ambulation Distance (Feet) 200 Feet  x 2; 120   Assistive device None   Gait Pattern Step-through pattern;Decreased arm swing - left;Decreased step length - left;Decreased arm swing - right;Decreased trunk rotation   Ambulation Surface Level   Gait Comments Forward/back walking in hallway, gait with conversation tasks with cues for upright posture            PWR ([facility]) - 01/17/17 2203  PWR! exercises Moves in standing   PWR! Up x 10   PWR! Rock x 10 reps each side   PWR! Twist x 10 reps each side   PWR Step x 10 reps each side   Comments At counter, PT provides minimal cues for technique for each movement          Balance Exercises - 01/17/17 2204      Balance Exercises: Standing   Standing Eyes Opened Narrow base of support (BOS);Wide (BOA);Head turns;Foam/compliant surface  Head nods, 10 reps   Standing Eyes Closed Wide (BOA);Narrow base of support (BOS);Foam/compliant surface;2 reps;10 secs   SLS --  Side step taps to 6" step, x 10 reps   SLS with Vectors Solid surface;Upper extremity assist 1  10 reps step taps to  6" step, x 10 to 12" step   Stepping Strategy Anterior;Posterior;UE support;10 reps   Retro Gait Upper extremity support;4 reps  Forward/back walking-cues to look ahead to visual target   Marching Limitations marching forward 4 reps along counter, cues for looking ahead at visual target           PT Education - 01/17/17 2208    Education provided Yes   Education Details PWR! Moves Basic 4 in standing as part of HEP (reviewed from pt's exercise binder); discussed POC and continueing x 2 weeks (to continue with POC), to make up for being out with eye surgery   Person(s) Educated Patient   Methods Explanation;Demonstration;Verbal cues;Tactile cues   Comprehension Verbalized understanding;Returned demonstration;Verbal cues required          PT Short Term Goals - 01/14/17 1729      PT SHORT TERM GOAL #1   Title Pt will be independent with HEP to address Parkinson's disease-related deficits.   Baseline 01/14/17-pt returned after missing 2+weeks due to cataract surgery. Reports he is "just getting back into doing" HEP   Time 4   Period Weeks   Status Not Met     PT SHORT TERM GOAL #2   Title Pt will perform at least 8 of 10 reps of sit<>stand from lower than 18 inch surfaces, minimal to no UE support, independently, for improved ease of transfers.   Time 4   Period Weeks   Status Achieved     PT SHORT TERM GOAL #3   Title Pt will improveTUG cognitive score to less than or equal to 15 seconds for decreased fall risk.   Baseline 10/16  14.04 sec   Time 4   Period Weeks   Status Achieved     PT SHORT TERM GOAL #4   Title Pt will improve SLS to at least 3 seconds each leg, for improved obstacle and step negotiation.   Baseline 10/16 RLE 4.13 sec, LLE 4.17 sec   Time 4   Period Weeks   Status Achieved           PT Long Term Goals - 12/06/16 1903      PT LONG TERM GOAL #1   Title Pt will verbalize understanding of fall prevention in home environment.     Time 6    Period Weeks   Status New   Target Date 01/17/17     PT LONG TERM GOAL #2   Title Pt will improve TUG and TUG cog score to within <10% difference, for improved dual tasking, decreased fall risk.   Time 6   Period Weeks   Status New   Target Date 01/17/17  PT LONG TERM GOAL #3   Title Pt will improve MiniBESTest score to at least 23/28 for decreased fall risk.   Time 6   Period Weeks   Status New   Target Date 01/17/17     PT LONG TERM GOAL #4   Title Pt will verbalize plans for continued community fitness upon d/c from PT.   Time 6   Period Weeks   Status New   Target Date 01/17/17               Plan - 01/17/17 2211    Clinical Impression Statement Worked on PWR! MOves in standing this visit as well as other dynamic balance activities, including corner exercises on compliant surfaces to decrease pt's reliance on vision (looking down with gait and balance activities).  Pt will continue to benefit from skilled PT to work towards [place] (note date pushed back 2 weeks to accomodate for pt being out for eye surgery)   Rehab Potential Good   PT Frequency 2x / week   PT Duration 6 weeks  plus eval   PT Treatment/Interventions ADLs/Self Care Home Management;Functional mobility training;Gait training;Therapeutic activities;Therapeutic exercise;Balance training;Neuromuscular re-education;Patient/family education   PT Next Visit Plan Recommend pt continue PWR! MOves in standing as part of HEP, other wise, may consider adding corner balance exercises; work towards [place] (pt has 2 weeks left in POC, but with scheduling, may need recert to cover dates beyond 02/03/17)   Consulted and Agree with Plan of Care Patient;Family member/caregiver   Family Member Consulted daughter      Patient will benefit from skilled therapeutic intervention in order to improve the following deficits and impairments:  Abnormal gait, Decreased balance, Decreased mobility, Decreased strength, Difficulty  walking, Postural dysfunction, Impaired tone  Visit Diagnosis: Other abnormalities of gait and mobility  Unsteadiness on feet  Other symptoms and signs involving the nervous system     Problem List Patient Active Problem List   Diagnosis Date Noted  . PD (Parkinson's disease) ([place]) 12/18/2015  . Tremor of both hands 11/22/2015  . PAD (peripheral artery disease) s/p remote left external iliac artery angioplasty 10/28/2012  . Coronary atherosclerosis 10/28/2012  . Hyperlipidemia 10/28/2012  . HTN (hypertension) 10/28/2012  . DM2 (diabetes mellitus, type 2) ([place]) 10/28/2012  . Carotid occlusion, right 10/28/2012    , W. 01/17/2017, 10:17 PM  Frazier Butt., PT   [facility] [address] [place], [place], [address] Phone: [phone]   Fax:  [phone]  Name: Rodney Love MRN: [id number] Date of Birth: 04-Feb-1946

## 2017-01-21 ENCOUNTER — Ambulatory Visit: Payer: Medicare Other | Admitting: Occupational Therapy

## 2017-01-21 DIAGNOSIS — R278 Other lack of coordination: Secondary | ICD-10-CM

## 2017-01-21 DIAGNOSIS — R471 Dysarthria and anarthria: Secondary | ICD-10-CM | POA: Diagnosis not present

## 2017-01-21 DIAGNOSIS — R2689 Other abnormalities of gait and mobility: Secondary | ICD-10-CM | POA: Diagnosis not present

## 2017-01-21 DIAGNOSIS — R29818 Other symptoms and signs involving the nervous system: Secondary | ICD-10-CM | POA: Diagnosis not present

## 2017-01-21 DIAGNOSIS — M25612 Stiffness of left shoulder, not elsewhere classified: Secondary | ICD-10-CM | POA: Diagnosis not present

## 2017-01-21 DIAGNOSIS — R2681 Unsteadiness on feet: Secondary | ICD-10-CM | POA: Diagnosis not present

## 2017-01-21 DIAGNOSIS — R293 Abnormal posture: Secondary | ICD-10-CM

## 2017-01-21 DIAGNOSIS — R29898 Other symptoms and signs involving the musculoskeletal system: Secondary | ICD-10-CM

## 2017-01-21 NOTE — Therapy (Signed)
[facility] [address] [place], [place], [address] Phone: [phone]   Fax:  [phone]  Occupational Therapy Treatment  Patient Details  Name: Rodney Love MRN: [id number] Date of Birth: 1945-08-13 Referring Provider: Dr. Wells Guiles Tat   Encounter Date: 01/21/2017      OT End of Session - 01/21/17 1404    Visit Number 6   Number of Visits 17   Date for OT Re-Evaluation 02/03/17   Authorization Type UHC Medicare, no visit limit/no auth; G-code needed   Authorization - Visit Number 6   Authorization - Number of Visits 10   OT Start Time 4076   OT Stop Time 1445   OT Time Calculation (min) 40 min   Activity Tolerance Patient tolerated treatment well   Behavior During Therapy [facility] for tasks assessed/performed      Past Medical History:  Diagnosis Date  . Arthritis   . Constipation    OCCASIONAL  . Coronary artery disease   . Diabetes mellitus without complication ([place])   . Environmental allergies   . Hyperlipidemia   . Hypertension   . Inguinal hernia   . Nocturia   . Peripheral arterial disease [facility])     Past Surgical History:  Procedure Laterality Date  . BACK SURGERY    . CARDIAC CATHETERIZATION  05/29/2006   single vessel CAD w/moderate stenosis of the prox. RCA approx 40%,prox. LAD 20-30%,minor irreg. mid abdominal aorta,left iliac artery disease 50-60%  . CATARACT EXTRACTION Right   . HERNIA REPAIR  [id number]  . INGUINAL HERNIA REPAIR N/A 09/23/2012   Procedure: LAPAROSCOPIC INGUINAL HERNIA with umbilical hernia;  Surgeon: Madilyn Hook, DO;  [facility];  Service: General;  Laterality: N/A;  . INSERTION OF MESH Bilateral 09/23/2012   Procedure: INSERTION OF MESH;  Surgeon: Madilyn Hook, DO;  [facility];  Service: General;  Laterality: Bilateral;    There were no vitals filed for this visit.      Subjective Assessment - 01/21/17 1931    Subjective  doing some exercises at home,  forgot notebook   Patient is accompained by: Family member   Pertinent History Parkinson's disease; PAD, HTN, DM, hyperlipedemia, hx of low back pain   Limitations fall risk   Patient Stated Goals improve buttoning, donning shoes/socks   Currently in Pain? No/denies   Pain Onset More than a month ago      PWR! Moves (up, rock, step) in sitting x 10-20 each with min cues For incr movement amplitude and sequencing step.  Arm bike x81mn level 1 for reciprocal movement with cues/target of at least 40rpms for intensity while maintaining movement amplitude/reciprocal movement.   Pt maintained 40rpms.  Practiced buttoning/unbuttoning shirt on table top with min-mod cues for use of PWR! Hands prior to buttoning and use of deliberate/large amplitude movements for fastening/unfastening after review of strategy.    Began checking STGs--see below.                            OT Education - 01/21/17 1937    Education Details Community Resources:  POP community group, PD cycle class, PWR! Moves class, [employer] Aware In Care Kit, [place] Manual)   Person(s) Educated Patient   Methods Explanation;Handout   Comprehension Verbalized understanding          OT Short Term Goals - 01/21/17 1408      OT SHORT TERM GOAL #1  Title Pt will be independent with updated HEP.--check 01/24/17   Time 4   Period Weeks   Status On-going  01/21/17:  min cueing     OT SHORT TERM GOAL #2   Title Pt will improve L hand coordination for ADLs as shown by improving time on 9-hole peg test by at least 5sec.   Baseline L-43.91sec   Time 4   Period Weeks   Status Achieved  01/21/17:  L-32.46sec, R-23.60     OT SHORT TERM GOAL #3   Title Pt will be able to don shoes/sock mod I.   Baseline needs assist   Time 4   Period Weeks   Status Achieved  01/21/17     OT SHORT TERM GOAL #4   Title Pt will verbalize understanding of appropriate community resources and  ways to prevent future complications related to PD.   Baseline -----   Time 4   Period Weeks   Status On-going  01/21/17:  educated in (POP, cycle class, PWR! class, Engineer, site Event, Aware In Care Kit, [place])           OT Long Term Goals - 01/21/17 1420      OT LONG TERM GOAL #1   Title Pt will verbalize understanding of adaptive strategies for ADLs/IADLs to incr ease, incr safety, and decr risk of pain.--check LTGs 02/03/17   Time 8   Period Weeks   Status New     OT LONG TERM GOAL #2   Title Pt will improve bilateral functional reaching/coordination as shown by improving score on box and blocks test by at least 3 with RUE and at least 5 with LUE>   Baseline R-47 blocks, L-41 blocks   Time 8   Period Weeks   Status On-going  01/21/17:  Partially met, met with LUE R-49, L-50 blocks     OT LONG TERM GOAL #3   Title Pt will be able to fasten/unfasten 3 buttons in less than 60sec.   Baseline ---   Time 8   Period Weeks   Status New     OT LONG TERM GOAL #4   Title Pt will improve balance/functional reaching for ADLs/IADLs as shown by improving standing functional reach to 10 inches or greater bilaterally   Baseline R-9", L-9.5"   Time 8   Period Weeks   Status New     OT LONG TERM GOAL #5   Title Pt will improve L hand coordination for ADLs as shown by improving time on 9-hole peg test by at least 10sec.   Baseline 43.91sec   Time 8   Period Weeks   Status Achieved  01/21/17:  32.46sec               Plan - 01/21/17 1404    Clinical Impression Statement Pt is progressing towards goals with improving coordination.  Pt continues to need cueing at times to sequence movement and for incr movement amplitude.     Rehab Potential Good   OT Frequency 2x / week   OT Duration 8 weeks   Plan help pt organize notebook to incr ease with exercise chart; review ways to prevent future complications/big movements with ADLs   Consulted and Agree with Plan of  Care Patient;Family member/caregiver   Family Member Consulted daughter      Patient will benefit from skilled therapeutic intervention in order to improve the following deficits and impairments:  Decreased balance, Decreased mobility, Decreased activity tolerance, Decreased coordination, Decreased  knowledge of precautions, Decreased safety awareness, Impaired tone, Improper body mechanics, Impaired UE functional use, Decreased range of motion, Decreased knowledge of use of DME  Visit Diagnosis: Other symptoms and signs involving the nervous system  Other symptoms and signs involving the musculoskeletal system  Abnormal posture  Unsteadiness on feet  Other abnormalities of gait and mobility  Other lack of coordination  Stiffness of left shoulder, not elsewhere classified    Problem List Patient Active Problem List   Diagnosis Date Noted  . PD (Parkinson's disease) ([place]) 12/18/2015  . Tremor of both hands 11/22/2015  . PAD (peripheral artery disease) s/p remote left external iliac artery angioplasty 10/28/2012  . Coronary atherosclerosis 10/28/2012  . Hyperlipidemia 10/28/2012  . HTN (hypertension) 10/28/2012  . DM2 (diabetes mellitus, type 2) ([place]) 10/28/2012  . Carotid occlusion, right 10/28/2012    [facility] 01/21/2017, 7:38 PM  [facility] [address] [place], [place], [address] Phone: [phone]   Fax:  [phone]  Name: QUADARIUS HENTON MRN: [id number] Date of Birth: 29-Oct-1945   Vianne Bulls, OTR/L [facility] [address] [place], [place]  [address] [phone] phone [phone] 01/21/17 7:38 PM

## 2017-01-23 ENCOUNTER — Ambulatory Visit: Payer: Medicare Other | Admitting: Occupational Therapy

## 2017-01-23 DIAGNOSIS — R278 Other lack of coordination: Secondary | ICD-10-CM | POA: Diagnosis not present

## 2017-01-23 DIAGNOSIS — R29818 Other symptoms and signs involving the nervous system: Secondary | ICD-10-CM | POA: Diagnosis not present

## 2017-01-23 DIAGNOSIS — R293 Abnormal posture: Secondary | ICD-10-CM | POA: Diagnosis not present

## 2017-01-23 DIAGNOSIS — R2681 Unsteadiness on feet: Secondary | ICD-10-CM | POA: Diagnosis not present

## 2017-01-23 DIAGNOSIS — R29898 Other symptoms and signs involving the musculoskeletal system: Secondary | ICD-10-CM | POA: Diagnosis not present

## 2017-01-23 DIAGNOSIS — R2689 Other abnormalities of gait and mobility: Secondary | ICD-10-CM | POA: Diagnosis not present

## 2017-01-23 DIAGNOSIS — M25612 Stiffness of left shoulder, not elsewhere classified: Secondary | ICD-10-CM | POA: Diagnosis not present

## 2017-01-23 DIAGNOSIS — R471 Dysarthria and anarthria: Secondary | ICD-10-CM | POA: Diagnosis not present

## 2017-01-23 NOTE — Therapy (Signed)
[facility] [address] [place], [place], [address] Phone: [phone]   Fax:  [phone]  Occupational Therapy Treatment  Patient Details  Name: Rodney Love MRN: [id number] Date of Birth: 03-22-1946 Referring Provider: Dr. Lurena Joiner Tat   Encounter Date: 01/23/2017      OT End of Session - 01/23/17 1403    Visit Number 7   Number of Visits 17   Date for OT Re-Evaluation 02/03/17   Authorization Type UHC Medicare, no visit limit/no auth; G-code needed   Authorization - Visit Number 7   Authorization - Number of Visits 10   OT Start Time 1403   OT Stop Time 1443   OT Time Calculation (min) 40 min   Activity Tolerance Patient tolerated treatment well   Behavior During Therapy [facility] for tasks assessed/performed      Past Medical History:  Diagnosis Date  . Arthritis   . Constipation    OCCASIONAL  . Coronary artery disease   . Diabetes mellitus without complication ([facility])   . Environmental allergies   . Hyperlipidemia   . Hypertension   . Inguinal hernia   . Nocturia   . Peripheral arterial disease [facility])     Past Surgical History:  Procedure Laterality Date  . BACK SURGERY    . CARDIAC CATHETERIZATION  05/29/2006   single vessel CAD w/moderate stenosis of the prox. RCA approx 40%,prox. LAD 20-30%,minor irreg. mid abdominal aorta,left iliac artery disease 50-60%  . CATARACT EXTRACTION Right   . HERNIA REPAIR  [id number]  . INGUINAL HERNIA REPAIR N/A 09/23/2012   Procedure: LAPAROSCOPIC INGUINAL HERNIA with umbilical hernia;  Surgeon: Lodema Pilot, DO;  [facility];  Service: General;  Laterality: N/A;  . INSERTION OF MESH Bilateral 09/23/2012   Procedure: INSERTION OF MESH;  Surgeon: Lodema Pilot, DO;  [facility];  Service: General;  Laterality: Bilateral;    There were no vitals filed for this visit.      Subjective Assessment - 01/23/17 1403    Subjective  Pt reports that he's looked at PD  ex chart a little    Patient is accompained by: Family member   Pertinent History Parkinson's disease; PAD, HTN, DM, hyperlipedemia, hx of low back pain   Limitations fall risk   Patient Stated Goals improve buttoning, donning shoes/socks   Currently in Pain? No/denies   Pain Onset More than a month ago        PWR! Moves (basic 4) in supine x 20 each with min cues For incr movement amplitude and to sequence step. Reviewed supine>sitting with large amplitude movement strategies to decr stress on back/prevent pain.  Arm bike x104min level 1 for reciprocal movement with cues/target of at least 40rpms for intensity while maintaining movement amplitude/reciprocal movement.   Pt maintained 40-44rpms.  In sitting, functional reaching in ER and overhead for wt. Shift, trunk rotation, and large amplitude movements to place large pegs in vertical pegboard for coordination.  Pt with min v.c. for incr amplitude.  Pt instructed in how this relates to functional ADLs.    In sitting, reaching floor/overhead for stretch with min cues for large amplitude then crossing legs and holding for stretch (each LE) and in prep for LB dressing.                         OT Education - 01/23/17 1427    Education Details Reviewed prevention of future complications; reviewed use of  PD ex chart (previously issued)   Person(s) Educated Patient   Methods Explanation;Handout   Comprehension Verbalized understanding          OT Short Term Goals - 01/23/17 1428      OT SHORT TERM GOAL #1   Title Pt will be independent with updated HEP.--check 01/24/17   Time 4   Period Weeks   Status On-going  01/21/17:  min cueing     OT SHORT TERM GOAL #2   Title Pt will improve L hand coordination for ADLs as shown by improving time on 9-hole peg test by at least 5sec.   Baseline L-43.91sec   Time 4   Period Weeks   Status Achieved  01/21/17:  L-32.46sec, R-23.60     OT SHORT TERM GOAL #3   Title Pt  will be able to don shoes/sock mod I.   Baseline needs assist   Time 4   Period Weeks   Status Achieved  01/21/17     OT SHORT TERM GOAL #4   Title Pt will verbalize understanding of appropriate community resources and ways to prevent future complications related to PD.   Baseline -----   Time 4   Period Weeks   Status Achieved  01/21/17:  educated in (POP, cycle class, PWR! class, Engineer, site Event, Aware In Care Kit, [place]).  01/23/17  met           OT Long Term Goals - 01/21/17 1420      OT LONG TERM GOAL #1   Title Pt will verbalize understanding of adaptive strategies for ADLs/IADLs to incr ease, incr safety, and decr risk of pain.--check LTGs 02/03/17   Time 8   Period Weeks   Status New     OT LONG TERM GOAL #2   Title Pt will improve bilateral functional reaching/coordination as shown by improving score on box and blocks test by at least 3 with RUE and at least 5 with LUE>   Baseline R-47 blocks, L-41 blocks   Time 8   Period Weeks   Status On-going  01/21/17:  Partially met, met with LUE R-49, L-50 blocks     OT LONG TERM GOAL #3   Title Pt will be able to fasten/unfasten 3 buttons in less than 60sec.   Baseline ---   Time 8   Period Weeks   Status New     OT LONG TERM GOAL #4   Title Pt will improve balance/functional reaching for ADLs/IADLs as shown by improving standing functional reach to 10 inches or greater bilaterally   Baseline R-9", L-9.5"   Time 8   Period Weeks   Status New     OT LONG TERM GOAL #5   Title Pt will improve L hand coordination for ADLs as shown by improving time on 9-hole peg test by at least 10sec.   Baseline 43.91sec   Time 8   Period Weeks   Status Achieved  01/21/17:  32.46sec               Plan - 01/23/17 1415    Clinical Impression Statement Pt is progressing towards goals with less cueing needed for sequencing supine PWR!Marland Kitchen  However, pt continues to need reinforcement for L elbow ext and finger  ext.   Rehab Potential Good   OT Frequency 2x / week   OT Duration 8 weeks   Plan review big movements with ADLs (pt has handout in notebook)   Consulted and Agree with  Plan of Care Patient;Family member/caregiver   Family Member Consulted daughter      Patient will benefit from skilled therapeutic intervention in order to improve the following deficits and impairments:  Decreased balance, Decreased mobility, Decreased activity tolerance, Decreased coordination, Decreased knowledge of precautions, Decreased safety awareness, Impaired tone, Improper body mechanics, Impaired UE functional use, Decreased range of motion, Decreased knowledge of use of DME  Visit Diagnosis: Other symptoms and signs involving the nervous system  Other symptoms and signs involving the musculoskeletal system  Abnormal posture  Unsteadiness on feet  Other abnormalities of gait and mobility  Other lack of coordination  Stiffness of left shoulder, not elsewhere classified    Problem List Patient Active Problem List   Diagnosis Date Noted  . PD (Parkinson's disease) ([place]) 12/18/2015  . Tremor of both hands 11/22/2015  . PAD (peripheral artery disease) s/p remote left external iliac artery angioplasty 10/28/2012  . Coronary atherosclerosis 10/28/2012  . Hyperlipidemia 10/28/2012  . HTN (hypertension) 10/28/2012  . DM2 (diabetes mellitus, type 2) ([place]) 10/28/2012  . Carotid occlusion, right 10/28/2012    [facility] 01/23/2017, 4:03 PM  [facility] [address] [place], [place], [address] Phone: [phone]   Fax:  [phone]  Name: GABERIEL YOUNGBLOOD MRN: [id number] Date of Birth: 1945-12-18   Vianne Bulls, OTR/L [facility] [address] [place], [place]  [address] [phone] phone [phone] 01/23/17 4:03 PM

## 2017-01-23 NOTE — Patient Instructions (Signed)
    Ways to prevent future Parkinson's related complications:  1.   Exercise regularly.  Perform your therapy exercises and incorporate safe aerobic exercise when possible (swimming, stationary bike, arm bike, seated stepper) 2.   Focus on BIGGER movements during daily activities- really reach overhead, straighten elbows and extend fingers 3.   When dressing or reaching for your seatbelt make sure to use your body to assist by twisting and looking at where you are reaching while you reach--this can help to minimize stress on the shoulder and reduce the risk of a rotator cuff tear 4.   Swing your arms when you walk!  People with PD are at increased risk for frozen shoulder and swinging your arms can reduce this risk. 5.  Keep you feet apart when you are standing to allow you to have better balance and reach further.  Make sure your feet are apart when you are sitting before you stand up.

## 2017-01-27 ENCOUNTER — Ambulatory Visit: Payer: Medicare Other | Admitting: Physical Therapy

## 2017-01-27 DIAGNOSIS — R29818 Other symptoms and signs involving the nervous system: Secondary | ICD-10-CM | POA: Diagnosis not present

## 2017-01-27 DIAGNOSIS — R2689 Other abnormalities of gait and mobility: Secondary | ICD-10-CM | POA: Diagnosis not present

## 2017-01-27 DIAGNOSIS — R293 Abnormal posture: Secondary | ICD-10-CM | POA: Diagnosis not present

## 2017-01-27 DIAGNOSIS — R278 Other lack of coordination: Secondary | ICD-10-CM | POA: Diagnosis not present

## 2017-01-27 DIAGNOSIS — R471 Dysarthria and anarthria: Secondary | ICD-10-CM | POA: Diagnosis not present

## 2017-01-27 DIAGNOSIS — R2681 Unsteadiness on feet: Secondary | ICD-10-CM

## 2017-01-27 DIAGNOSIS — M25612 Stiffness of left shoulder, not elsewhere classified: Secondary | ICD-10-CM | POA: Diagnosis not present

## 2017-01-27 DIAGNOSIS — R29898 Other symptoms and signs involving the musculoskeletal system: Secondary | ICD-10-CM | POA: Diagnosis not present

## 2017-01-27 NOTE — Therapy (Signed)
[facility] [address] [place], [place], [address] Phone: [phone]   Fax:  [phone]  Physical Therapy Treatment  Patient Details  Name: Rodney Love MRN: [id number] Date of Birth: [birth year] Referring Provider: Alonza Bogus  Encounter Date: 01/27/2017      PT End of Session - 01/27/17 2125    Visit Number 7   Number of Visits 13   Date for PT Re-Evaluation 02/03/17   Authorization Type UHC Medicare-GCODE every 10th visit   PT Start Time 1325   PT Stop Time 1406   PT Time Calculation (min) 41 min   Activity Tolerance Patient tolerated treatment well   Behavior During Therapy [facility] for tasks assessed/performed      Past Medical History:  Diagnosis Date  . Arthritis   . Constipation    OCCASIONAL  . Coronary artery disease   . Diabetes mellitus without complication ([place])   . Environmental allergies   . Hyperlipidemia   . Hypertension   . Inguinal hernia   . Nocturia   . Peripheral arterial disease [facility])     Past Surgical History:  Procedure Laterality Date  . BACK SURGERY    . CARDIAC CATHETERIZATION  05/29/2006   single vessel CAD w/moderate stenosis of the prox. RCA approx 40%,prox. LAD 20-30%,minor irreg. mid abdominal aorta,left iliac artery disease 50-60%  . CATARACT EXTRACTION Right   . HERNIA REPAIR  [id number]  . INGUINAL HERNIA REPAIR N/A 09/23/2012   Procedure: LAPAROSCOPIC INGUINAL HERNIA with umbilical hernia;  Surgeon: Madilyn Hook, DO;  Location: [facility];  Service: General;  Laterality: N/A;  . INSERTION OF MESH Bilateral 09/23/2012   Procedure: INSERTION OF MESH;  Surgeon: Madilyn Hook, DO;  Location: [facility];  Service: General;  Laterality: Bilateral;    There were no vitals filed for this visit.      Subjective Assessment - 01/27/17 1327    Subjective No changes, no pain except in the mornings.   Patient is accompained by: Family member   Pertinent History ruptured disk in back  (years ago per pt report), OA, DM, CAD, HTN, PAD   Patient Stated Goals Pt's goals are to be able to walk where I want to, when I want to.   Currently in Pain? No/denies   Pain Onset --                         [facility] Adult PT Treatment/Exercise - 01/27/17 0001      Ambulation/Gait   Gait Comments Short distance gait in gym, with head turns, with head nods, with noted slower pace of gait     Exercises   Exercises Knee/Hip     Knee/Hip Exercises: Aerobic   Stepper Seated SciFit Stepper, Level 1.8, 4 extremities x 4 minutes, cues for >70 RPM    (Also see below:  Heel/toe raises, x 20 reps)       [facility]) - 01/27/17 1331    PWR! exercises Moves in standing   PWR! Up x 10    PWR! Rock x 10 reps each side   PWR! Twist x 10 reps each side  verbal/tactile cues for open posture/brief pause in middle   PWR Step x 10 reps each side   Comments At counter, therapist provides minimal cues for technique    Discussed and reviewed functional importance of each PWR! Move in standing, following performance of standing PWR! Moves exercises.      Balance  Exercises - 01/27/17 2114      Balance Exercises: Standing   SLS Eyes open;Solid surface;Upper extremity support 1;3 reps   Stepping Strategy Anterior;Posterior;UE support;10 reps  Cues for intensity to step and regain balance   Gait with Head Turns Forward;Foam/compliant surface;4 reps   Retro Gait Foam/compliant surface;2 reps  Forward/back on red mat surface   Sidestepping Foam/compliant support;2 reps  each direction   Heel Raises Limitations x 20   Toe Raise Limitations x 20   Other Standing Exercises Single limb stance activities with forward step taps to 6" step, alternating legs x 10, same leg x 10 reps; then step up-up/down-down x 10 reps             PT Short Term Goals - 01/14/17 1729      PT SHORT TERM GOAL #1   Title Pt will be independent with HEP to address Parkinson's disease-related deficits.    Baseline 01/14/17-pt returned after missing 2+weeks due to cataract surgery. Reports he is "just getting back into doing" HEP   Time 4   Period Weeks   Status Not Met     PT SHORT TERM GOAL #2   Title Pt will perform at least 8 of 10 reps of sit<>stand from lower than 18 inch surfaces, minimal to no UE support, independently, for improved ease of transfers.   Time 4   Period Weeks   Status Achieved     PT SHORT TERM GOAL #3   Title Pt will improveTUG cognitive score to less than or equal to 15 seconds for decreased fall risk.   Baseline 10/16  14.04 sec   Time 4   Period Weeks   Status Achieved     PT SHORT TERM GOAL #4   Title Pt will improve SLS to at least 3 seconds each leg, for improved obstacle and step negotiation.   Baseline 10/16 RLE 4.13 sec, LLE 4.17 sec   Time 4   Period Weeks   Status Achieved           PT Long Term Goals - 12/06/16 1903      PT LONG TERM GOAL #1   Title Pt will verbalize understanding of fall prevention in home environment.     Time 6   Period Weeks   Status New   Target Date 01/17/17     PT LONG TERM GOAL #2   Title Pt will improve TUG and TUG cog score to within <10% difference, for improved dual tasking, decreased fall risk.   Time 6   Period Weeks   Status New   Target Date 01/17/17     PT LONG TERM GOAL #3   Title Pt will improve MiniBESTest score to at least 23/28 for decreased fall risk.   Time 6   Period Weeks   Status New   Target Date 01/17/17     PT LONG TERM GOAL #4   Title Pt will verbalize plans for continued community fitness upon d/c from PT.   Time 6   Period Weeks   Status New   Target Date 01/17/17               Plan - 01/27/17 2126    Clinical Impression Statement Treatment session focused on PWR! Moves exercises, including making functional connection between exercises and functional activites, SLS and dynamic balance exercises.  With initial cues for PWR! Moves in standing, pt able to to  continue with good technique and form.  Pt  does need cueing for upright posture during other balance exercises.   Rehab Potential Good   PT Frequency 2x / week   PT Duration 6 weeks  plus eval   PT Treatment/Interventions ADLs/Self Care Home Management;Functional mobility training;Gait training;Therapeutic activities;Therapeutic exercise;Balance training;Neuromuscular re-education;Patient/family education   PT Next Visit Plan Need to check LTGs next visit and likely extend goals, due to pt being out due to scheduling issues (from his eye surgery and then unable to schedule PT at frequency)   Consulted and Agree with Plan of Care Patient;Family member/caregiver   Family Member Consulted daughter      Patient will benefit from skilled therapeutic intervention in order to improve the following deficits and impairments:  Abnormal gait, Decreased balance, Decreased mobility, Decreased strength, Difficulty walking, Postural dysfunction, Impaired tone  Visit Diagnosis: Unsteadiness on feet  Abnormal posture  Other symptoms and signs involving the nervous system     Problem List Patient Active Problem List   Diagnosis Date Noted  . PD (Parkinson's disease) ([place]) 12/18/2015  . Tremor of both hands 11/22/2015  . PAD (peripheral artery disease) s/p remote left external iliac artery angioplasty 10/28/2012  . Coronary atherosclerosis 10/28/2012  . Hyperlipidemia 10/28/2012  . HTN (hypertension) 10/28/2012  . DM2 (diabetes mellitus, type 2) ([place]) 10/28/2012  . Carotid occlusion, right 10/28/2012    Jarl Sellitto W. 01/27/2017, 9:33 PM  Frazier Butt., PT   [facility] [address] [place], [place], [address] Phone: [phone]   Fax:  [phone]  Name: Rodney Love MRN: [id number] Date of Birth: 06-Oct-1945

## 2017-01-29 ENCOUNTER — Ambulatory Visit: Payer: Medicare Other | Admitting: Occupational Therapy

## 2017-01-29 DIAGNOSIS — R2681 Unsteadiness on feet: Secondary | ICD-10-CM | POA: Diagnosis not present

## 2017-01-29 DIAGNOSIS — R278 Other lack of coordination: Secondary | ICD-10-CM | POA: Diagnosis not present

## 2017-01-29 DIAGNOSIS — M25612 Stiffness of left shoulder, not elsewhere classified: Secondary | ICD-10-CM | POA: Diagnosis not present

## 2017-01-29 DIAGNOSIS — R29818 Other symptoms and signs involving the nervous system: Secondary | ICD-10-CM | POA: Diagnosis not present

## 2017-01-29 DIAGNOSIS — R471 Dysarthria and anarthria: Secondary | ICD-10-CM | POA: Diagnosis not present

## 2017-01-29 DIAGNOSIS — R2689 Other abnormalities of gait and mobility: Secondary | ICD-10-CM | POA: Diagnosis not present

## 2017-01-29 DIAGNOSIS — R29898 Other symptoms and signs involving the musculoskeletal system: Secondary | ICD-10-CM | POA: Diagnosis not present

## 2017-01-29 DIAGNOSIS — R293 Abnormal posture: Secondary | ICD-10-CM | POA: Diagnosis not present

## 2017-01-29 NOTE — Therapy (Signed)
[facility] [address] [place], [place], [address] Phone: [phone]   Fax:  [phone]  Occupational Therapy Treatment  Patient Details  Name: Rodney Love MRN: [id number] Date of Birth: 1945/08/25 Referring Provider: Dr. Wells Guiles Tat   Encounter Date: 01/29/2017      OT End of Session - 01/29/17 1717    Visit Number 8   Number of Visits 17   Date for OT Re-Evaluation 02/03/17   Authorization Type UHC Medicare, no visit limit/no auth; G-code needed   Authorization - Visit Number 8   Authorization - Number of Visits 10   OT Start Time 1620   OT Stop Time 1700   OT Time Calculation (min) 40 min   Activity Tolerance Patient tolerated treatment well   Behavior During Therapy [facility] for tasks assessed/performed      Past Medical History:  Diagnosis Date  . Arthritis   . Constipation    OCCASIONAL  . Coronary artery disease   . Diabetes mellitus without complication ([place])   . Environmental allergies   . Hyperlipidemia   . Hypertension   . Inguinal hernia   . Nocturia   . Peripheral arterial disease [facility])     Past Surgical History:  Procedure Laterality Date  . BACK SURGERY    . CARDIAC CATHETERIZATION  05/29/2006   single vessel CAD w/moderate stenosis of the prox. RCA approx 40%,prox. LAD 20-30%,minor irreg. mid abdominal aorta,left iliac artery disease 50-60%  . CATARACT EXTRACTION Right   . HERNIA REPAIR  [id number]  . INGUINAL HERNIA REPAIR N/A 09/23/2012   Procedure: LAPAROSCOPIC INGUINAL HERNIA with umbilical hernia;  Surgeon: Madilyn Hook, DO;  [facility];  Service: General;  Laterality: N/A;  . INSERTION OF MESH Bilateral 09/23/2012   Procedure: INSERTION OF MESH;  Surgeon: Madilyn Hook, DO;  [facility];  Service: General;  Laterality: Bilateral;    There were no vitals filed for this visit.      Subjective Assessment - 01/29/17 1624    Pertinent History Parkinson's disease; PAD,  HTN, DM, hyperlipedemia, hx of low back pain   Patient Stated Goals improve buttoning, donning shoes/socks   Currently in Pain? No/denies             treatment:Dynamic step and reach with big movements to flip large playing cards then to retrieve items from low surface to overhead cabinet, min v.c for larger amplitude movements. Stacking coins and placing in container with big movements using LUE min-mod difficulty/ v.c Placing grooved pegs into pegboard with LUE for increased fine motor coordination, min difficulty Arm bike x 5 mins level for conditioning, pt maintained 40 rpm                OT Education - 01/29/17 1716    Education provided Yes   Education Details Reviewed big movements with ADLS, PWR! up, rock and step in seated x20 reps each   Person(s) Educated Patient;Child(ren)   Methods Explanation;Demonstration;Verbal cues;Handout   Comprehension Verbalized understanding;Returned demonstration;Verbal cues required          OT Short Term Goals - 01/23/17 1428      OT SHORT TERM GOAL #1   Title Pt will be independent with updated HEP.--check 01/24/17   Time 4   Period Weeks   Status On-going  01/21/17:  min cueing     OT SHORT TERM GOAL #2   Title Pt will improve L hand coordination for ADLs as shown by improving time  on 9-hole peg test by at least 5sec.   Baseline L-43.91sec   Time 4   Period Weeks   Status Achieved  01/21/17:  L-32.46sec, R-23.60     OT SHORT TERM GOAL #3   Title Pt will be able to don shoes/sock mod I.   Baseline needs assist   Time 4   Period Weeks   Status Achieved  01/21/17     OT SHORT TERM GOAL #4   Title Pt will verbalize understanding of appropriate community resources and ways to prevent future complications related to PD.   Baseline -----   Time 4   Period Weeks   Status Achieved  01/21/17:  educated in (POP, cycle class, PWR! class, Engineer, site Event, Aware In Care Kit, [place]).  01/23/17  met            OT Long Term Goals - 01/21/17 1420      OT LONG TERM GOAL #1   Title Pt will verbalize understanding of adaptive strategies for ADLs/IADLs to incr ease, incr safety, and decr risk of pain.--check LTGs 02/03/17   Time 8   Period Weeks   Status New     OT LONG TERM GOAL #2   Title Pt will improve bilateral functional reaching/coordination as shown by improving score on box and blocks test by at least 3 with RUE and at least 5 with LUE>   Baseline R-47 blocks, L-41 blocks   Time 8   Period Weeks   Status On-going  01/21/17:  Partially met, met with LUE R-49, L-50 blocks     OT LONG TERM GOAL #3   Title Pt will be able to fasten/unfasten 3 buttons in less than 60sec.   Baseline ---   Time 8   Period Weeks   Status New     OT LONG TERM GOAL #4   Title Pt will improve balance/functional reaching for ADLs/IADLs as shown by improving standing functional reach to 10 inches or greater bilaterally   Baseline R-9", L-9.5"   Time 8   Period Weeks   Status New     OT LONG TERM GOAL #5   Title Pt will improve L hand coordination for ADLs as shown by improving time on 9-hole peg test by at least 10sec.   Baseline 43.91sec   Time 8   Period Weeks   Status Achieved  01/21/17:  32.46sec               Plan - 01/29/17 1718    Clinical Impression Statement Pt is progresing towards goals. He demonstrates improvements in big movements with exercises after repetition/ reinforcement.   OT Frequency 2x / week   OT Duration 8 weeks   OT Treatment/Interventions Self-care/ADL training;Cryotherapy;Therapeutic exercise;DME and/or AE instruction;Therapist, nutritional;Therapeutic activities;Patient/family education;Balance training;Cognitive remediation/compensation;Manual Therapy;Neuromuscular education;Moist Heat;Energy conservation;Passive range of motion;Therapeutic exercises   Plan continue to work towards long term goals, big movements with functional activity   Consulted  and Agree with Plan of Care Patient;Family member/caregiver   Family Member Consulted daughter      Patient will benefit from skilled therapeutic intervention in order to improve the following deficits and impairments:  Decreased balance, Decreased mobility, Decreased activity tolerance, Decreased coordination, Decreased knowledge of precautions, Decreased safety awareness, Impaired tone, Improper body mechanics, Impaired UE functional use, Decreased range of motion, Decreased knowledge of use of DME  Visit Diagnosis: Other symptoms and signs involving the nervous system  Other symptoms and signs involving the musculoskeletal system  Other abnormalities of gait and mobility  Other lack of coordination  Stiffness of left shoulder, not elsewhere classified    Problem List Patient Active Problem List   Diagnosis Date Noted  . PD (Parkinson's disease) ([place]) 12/18/2015  . Tremor of both hands 11/22/2015  . PAD (peripheral artery disease) s/p remote left external iliac artery angioplasty 10/28/2012  . Coronary atherosclerosis 10/28/2012  . Hyperlipidemia 10/28/2012  . HTN (hypertension) 10/28/2012  . DM2 (diabetes mellitus, type 2) ([place]) 10/28/2012  . Carotid occlusion, right 10/28/2012    Johnette Teigen 01/29/2017, 5:19 PM  [facility] [address] [place], [place], [address] Phone: [phone]   Fax:  [phone]  Name: Rodney Love MRN: [id number] Date of Birth: 1945/12/15

## 2017-01-31 ENCOUNTER — Ambulatory Visit: Payer: Medicare Other | Attending: Internal Medicine | Admitting: Occupational Therapy

## 2017-01-31 ENCOUNTER — Ambulatory Visit: Payer: Medicare Other

## 2017-01-31 DIAGNOSIS — R471 Dysarthria and anarthria: Secondary | ICD-10-CM | POA: Diagnosis not present

## 2017-01-31 DIAGNOSIS — R293 Abnormal posture: Secondary | ICD-10-CM | POA: Diagnosis not present

## 2017-01-31 DIAGNOSIS — R2681 Unsteadiness on feet: Secondary | ICD-10-CM | POA: Insufficient documentation

## 2017-01-31 DIAGNOSIS — R2689 Other abnormalities of gait and mobility: Secondary | ICD-10-CM | POA: Insufficient documentation

## 2017-01-31 DIAGNOSIS — R29898 Other symptoms and signs involving the musculoskeletal system: Secondary | ICD-10-CM | POA: Diagnosis not present

## 2017-01-31 DIAGNOSIS — M25612 Stiffness of left shoulder, not elsewhere classified: Secondary | ICD-10-CM | POA: Diagnosis not present

## 2017-01-31 DIAGNOSIS — R29818 Other symptoms and signs involving the nervous system: Secondary | ICD-10-CM | POA: Insufficient documentation

## 2017-01-31 DIAGNOSIS — R278 Other lack of coordination: Secondary | ICD-10-CM | POA: Diagnosis not present

## 2017-01-31 NOTE — Patient Instructions (Signed)
  Please complete the assigned speech therapy homework prior to your next session and return it to the speech therapist at your next visit.  

## 2017-01-31 NOTE — Patient Instructions (Signed)
While seated loop a belt under your foot holding with both , use belt to help stretch you leg with knee bent and raised above hip 5 reps each leg before dressing,

## 2017-01-31 NOTE — Therapy (Signed)
[facility] [address] [place], [place], [address] Phone: [phone]   Fax:  [phone]  Speech Language Pathology Treatment  Patient Details  Name: Rodney Love MRN: [id number] Date of Birth: 02/09/1946 Referring Provider: Tat, Rebecca, D.O.  Encounter Date: 01/31/2017      End of Session - 01/31/17 1409    Visit Number 6   Number of Visits 17   Date for SLP Re-Evaluation 02/14/17   SLP Start Time 1404   SLP Stop Time  1445   SLP Time Calculation (min) 41 min   Activity Tolerance Patient tolerated treatment well      Past Medical History:  Diagnosis Date  . Arthritis   . Constipation    OCCASIONAL  . Coronary artery disease   . Diabetes mellitus without complication ([facility])   . Environmental allergies   . Hyperlipidemia   . Hypertension   . Inguinal hernia   . Nocturia   . Peripheral arterial disease ([facility])     Past Surgical History:  Procedure Laterality Date  . BACK SURGERY    . CARDIAC CATHETERIZATION  05/29/2006   single vessel CAD w/moderate stenosis of the prox. RCA approx 40%,prox. LAD 20-30%,minor irreg. mid abdominal aorta,left iliac artery disease 50-60%  . CATARACT EXTRACTION Right   . HERNIA REPAIR  [id number]  . INGUINAL HERNIA REPAIR N/A 09/23/2012   Procedure: LAPAROSCOPIC INGUINAL HERNIA with umbilical hernia;  Surgeon: Brian Layton, DO;  [facility];  Service: General;  Laterality: N/A;  . INSERTION OF MESH Bilateral 09/23/2012   Procedure: INSERTION OF MESH;  Surgeon: Brian Layton, DO;  [facility];  Service: General;  Laterality: Bilateral;    There were no vitals filed for this visit.      Subjective Assessment - 01/31/17 1406    Subjective Pt arrives today with his daughter.   Patient is accompained by: Family member  daughter   Currently in Pain? No/denies               ADULT SLP TREATMENT - 01/31/17 1407      General Information   Behavior/Cognition  Alert;Pleasant mood;Cooperative     Treatment Provided   Treatment provided Cognitive-Linquistic     Cognitive-Linquistic Treatment   Treatment focused on Dysarthria   Skilled Treatment SLP used loud /a/ (with "heyyyyyyyyy! Ahhhhhh" to recalibrate conversational loudness to WNL, average upper 80s to low 90s dB. WNL voicing heard, and not strained. SLP engaged pt with sentence tasks (mod complex) and pt req'd min cues occasionally for idiom meaning, in more general terms, pt was providing literal meanings for some idioms -  pt independnet wiht WNL loudness! Similarity/difference (mod complex) with average low 70s dB with occasional min-mod cues for reasoning. In conversation outdoors for 9 minutes (min-mod noisy) pt maintained intelligible speech with incr'd effort 100% of the time.      Assessment / Recommendations / Plan   Plan Continue with current plan of care     Progression Toward Goals   Progression toward goals Progressing toward goals            SLP Short Term Goals - 01/31/17 1502      SLP SHORT TERM GOAL #1   Title Pt will average 90dB on loud /a/ with rare min A over 5 sessions   Status Partially Met     SLP SHORT TERM GOAL #2   Title Pt will average in low 70s dB on 90% of sentence level structured   speech tasks with rare min A, over 3 sessions   Status Achieved     SLP SHORT TERM GOAL #3   Title Pt will maintain average of 70dB during 8 minute simple conversation with rare min A over three sessions   Status Not Met     SLP SHORT TERM GOAL #4   Title pt will demo knowledge of overt s/s dysphagia with meals with modified independence   Time 2   Status Deferred  due to focus on speech           SLP Long Term Goals - 01/31/17 1504      SLP LONG TERM GOAL #1   Title Pt will maintain average of 70dB during 15 minute moderately complex conversation with rare min A over two sessions   Time 5   Period Weeks   Status On-going     SLP LONG TERM GOAL #2   Title  Pt. will maintain audible conversation over 12 minutes in noisy environment outside of therapy room over two sessions   Time 5   Period Weeks   Status On-going     SLP LONG TERM GOAL #3   Title pt will tell SLP 3 overt s/s aspiration PNA with modified independence   Time 5   Period Weeks   Status New          Plan - 01/31/17 1459    Clinical Impression Statement Pt exhibiting WNL loudness today with mod complex phrase and sentence responses without cues from SLP. Some incr'd difficulty with thought organization with more complex stimuli. Pt WNL loudness transferred outdoors and was 100% intelligible. Skilled ST focusing on speech loudness and breath support in conversation, across speaking situations.   Speech Therapy Frequency 2x / week   Duration --  8 weeks   Treatment/Interventions Compensatory strategies;Patient/family education;Functional tasks;Cueing hierarchy;Compensatory techniques;Internal/external aids;SLP instruction and feedback   Potential to Achieve Goals Good      Patient will benefit from skilled therapeutic intervention in order to improve the following deficits and impairments:   Dysarthria and anarthria    Problem List Patient Active Problem List   Diagnosis Date Noted  . PD (Parkinson's disease) ([place]) 12/18/2015  . Tremor of both hands 11/22/2015  . PAD (peripheral artery disease) s/p remote left external iliac artery angioplasty 10/28/2012  . Coronary atherosclerosis 10/28/2012  . Hyperlipidemia 10/28/2012  . HTN (hypertension) 10/28/2012  . DM2 (diabetes mellitus, type 2) ([place]) 10/28/2012  . Carotid occlusion, right 10/28/2012    [facility] ,MS, CCC-SLP  01/31/2017, 3:05 PM  [facility] [address] [place], [place], [address] Phone: [phone]   Fax:  [phone]   Name: Rodney Love MRN: [id number] Date of Birth: 01/22/1946

## 2017-01-31 NOTE — Therapy (Signed)
[facility] [address] [place], [place], [address] Phone: [phone]   Fax:  [phone]  Occupational Therapy Treatment  Patient Details  Name: Rodney Love MRN: [id number] Date of Birth: 08-09-1945 Referring Provider: Dr. Wells Guiles Tat   Encounter Date: 01/31/2017      OT End of Session - 01/31/17 1651    Visit Number 9   Number of Visits 17   Date for OT Re-Evaluation 02/03/17   Authorization Type [employer], no visit limit/no auth; G-code needed   Authorization Time Period G code next visit   Authorization - Visit Number 9   Authorization - Number of Visits 10   OT Start Time 5465   OT Stop Time 1400   OT Time Calculation (min) 43 min      Past Medical History:  Diagnosis Date  . Arthritis   . Constipation    OCCASIONAL  . Coronary artery disease   . Diabetes mellitus without complication ([place])   . Environmental allergies   . Hyperlipidemia   . Hypertension   . Inguinal hernia   . Nocturia   . Peripheral arterial disease [facility])     Past Surgical History:  Procedure Laterality Date  . BACK SURGERY    . CARDIAC CATHETERIZATION  05/29/2006   single vessel CAD w/moderate stenosis of the prox. RCA approx 40%,prox. LAD 20-30%,minor irreg. mid abdominal aorta,left iliac artery disease 50-60%  . CATARACT EXTRACTION Right   . HERNIA REPAIR  [id number]  . INGUINAL HERNIA REPAIR N/A 09/23/2012   Procedure: LAPAROSCOPIC INGUINAL HERNIA with umbilical hernia;  Surgeon: Madilyn Hook, DO;  Location: [facility];  Service: General;  Laterality: N/A;  . INSERTION OF MESH Bilateral 09/23/2012   Procedure: INSERTION OF MESH;  Surgeon: Madilyn Hook, DO;  Location: [facility];  Service: General;  Laterality: Bilateral;    There were no vitals filed for this visit.      Subjective Assessment - 01/31/17 1318    Pertinent History Parkinson's disease; PAD, HTN, DM, hyperlipedemia, hx of low back pain   Patient Stated Goals  improve buttoning, donning shoes/socks   Currently in Pain? No/denies                    Dynamic step and reach with pt stepping over a target for larger amplitude movements, min / difficulty/ v.c Ambulating while dual tasking, to perform cognitive and physical task simultaneously, mod v.c ARm bike x 6 mins level 1, pt maintained 35-40 rpm for conditioning.          OT Education - 01/31/17 1654    Education Details figure 4 stretch prior to LB dressing, PWR! up, rock and step in modified quadraped   Person(s) Educated Patient;Child(ren)   Methods Explanation;Demonstration;Verbal cues;Handout   Comprehension Returned demonstration;Verbalized understanding;Verbal cues required          OT Short Term Goals - 01/23/17 1428      OT SHORT TERM GOAL #1   Title Pt will be independent with updated HEP.--check 01/24/17   Time 4   Period Weeks   Status On-going  01/21/17:  min cueing     OT SHORT TERM GOAL #2   Title Pt will improve L hand coordination for ADLs as shown by improving time on 9-hole peg test by at least 5sec.   Baseline L-43.91sec   Time 4   Period Weeks   Status Achieved  01/21/17:  L-32.46sec, R-23.60     OT SHORT TERM  GOAL #3   Title Pt will be able to don shoes/sock mod I.   Baseline needs assist   Time 4   Period Weeks   Status Achieved  01/21/17     OT SHORT TERM GOAL #4   Title Pt will verbalize understanding of appropriate community resources and ways to prevent future complications related to PD.   Baseline -----   Time 4   Period Weeks   Status Achieved  01/21/17:  educated in (POP, cycle class, PWR! class, Engineer, site Event, Aware In Care Kit, [place]).  01/23/17  met           OT Long Term Goals - 01/21/17 1420      OT LONG TERM GOAL #1   Title Pt will verbalize understanding of adaptive strategies for ADLs/IADLs to incr ease, incr safety, and decr risk of pain.--check LTGs 02/03/17   Time 8   Period Weeks    Status New     OT LONG TERM GOAL #2   Title Pt will improve bilateral functional reaching/coordination as shown by improving score on box and blocks test by at least 3 with RUE and at least 5 with LUE>   Baseline R-47 blocks, L-41 blocks   Time 8   Period Weeks   Status On-going  01/21/17:  Partially met, met with LUE R-49, L-50 blocks     OT LONG TERM GOAL #3   Title Pt will be able to fasten/unfasten 3 buttons in less than 60sec.   Baseline ---   Time 8   Period Weeks   Status New     OT LONG TERM GOAL #4   Title Pt will improve balance/functional reaching for ADLs/IADLs as shown by improving standing functional reach to 10 inches or greater bilaterally   Baseline R-9", L-9.5"   Time 8   Period Weeks   Status New     OT LONG TERM GOAL #5   Title Pt will improve L hand coordination for ADLs as shown by improving time on 9-hole peg test by at least 10sec.   Baseline 43.91sec   Time 8   Period Weeks   Status Achieved  01/21/17:  32.46sec               Plan - 01/31/17 1652    Clinical Impression Statement Pt is progressing towards goals. He demonstrates understanding of figure 4 stretch to perfrom prior to dressing.   Rehab Potential Good   OT Frequency 2x / week   OT Duration 8 weeks   OT Treatment/Interventions Self-care/ADL training;Cryotherapy;Therapeutic exercise;DME and/or AE instruction;Therapist, nutritional;Therapeutic activities;Patient/family education;Balance training;Cognitive remediation/compensation;Manual Therapy;Neuromuscular education;Moist Heat;Energy conservation;Passive range of motion;Therapeutic exercises   Plan big movements with functional activity.   Consulted and Agree with Plan of Care Patient;Family member/caregiver      Patient will benefit from skilled therapeutic intervention in order to improve the following deficits and impairments:  Decreased balance, Decreased mobility, Decreased activity tolerance, Decreased coordination,  Decreased knowledge of precautions, Decreased safety awareness, Impaired tone, Improper body mechanics, Impaired UE functional use, Decreased range of motion, Decreased knowledge of use of DME  Visit Diagnosis: Other symptoms and signs involving the nervous system  Other symptoms and signs involving the musculoskeletal system  Other lack of coordination  Stiffness of left shoulder, not elsewhere classified    Problem List Patient Active Problem List   Diagnosis Date Noted  . PD (Parkinson's disease) ([place]) 12/18/2015  . Tremor of both hands 11/22/2015  .  PAD (peripheral artery disease) s/p remote left external iliac artery angioplasty 10/28/2012  . Coronary atherosclerosis 10/28/2012  . Hyperlipidemia 10/28/2012  . HTN (hypertension) 10/28/2012  . DM2 (diabetes mellitus, type 2) ([place]) 10/28/2012  . Carotid occlusion, right 10/28/2012    Giovanni Biby 01/31/2017, 4:55 PM  [facility] [address] [place], [place], [address] Phone: [phone]   Fax:  [phone]  Name: KNOWLEDGE ESCANDON MRN: [id number] Date of Birth: 11/14/1945

## 2017-02-04 ENCOUNTER — Ambulatory Visit: Payer: Medicare Other | Admitting: Occupational Therapy

## 2017-02-04 ENCOUNTER — Encounter: Payer: Self-pay | Admitting: Physical Therapy

## 2017-02-04 ENCOUNTER — Ambulatory Visit: Payer: Medicare Other

## 2017-02-04 ENCOUNTER — Ambulatory Visit: Payer: Medicare Other | Admitting: Physical Therapy

## 2017-02-04 DIAGNOSIS — Z85828 Personal history of other malignant neoplasm of skin: Secondary | ICD-10-CM | POA: Diagnosis not present

## 2017-02-04 DIAGNOSIS — R2689 Other abnormalities of gait and mobility: Secondary | ICD-10-CM

## 2017-02-04 DIAGNOSIS — M25612 Stiffness of left shoulder, not elsewhere classified: Secondary | ICD-10-CM

## 2017-02-04 DIAGNOSIS — L570 Actinic keratosis: Secondary | ICD-10-CM | POA: Diagnosis not present

## 2017-02-04 DIAGNOSIS — X32XXXD Exposure to sunlight, subsequent encounter: Secondary | ICD-10-CM | POA: Diagnosis not present

## 2017-02-04 DIAGNOSIS — R278 Other lack of coordination: Secondary | ICD-10-CM

## 2017-02-04 DIAGNOSIS — R471 Dysarthria and anarthria: Secondary | ICD-10-CM | POA: Diagnosis not present

## 2017-02-04 DIAGNOSIS — R29818 Other symptoms and signs involving the nervous system: Secondary | ICD-10-CM

## 2017-02-04 DIAGNOSIS — R2681 Unsteadiness on feet: Secondary | ICD-10-CM | POA: Diagnosis not present

## 2017-02-04 DIAGNOSIS — Z08 Encounter for follow-up examination after completed treatment for malignant neoplasm: Secondary | ICD-10-CM | POA: Diagnosis not present

## 2017-02-04 DIAGNOSIS — L821 Other seborrheic keratosis: Secondary | ICD-10-CM | POA: Diagnosis not present

## 2017-02-04 DIAGNOSIS — R293 Abnormal posture: Secondary | ICD-10-CM

## 2017-02-04 DIAGNOSIS — R29898 Other symptoms and signs involving the musculoskeletal system: Secondary | ICD-10-CM | POA: Diagnosis not present

## 2017-02-04 DIAGNOSIS — L820 Inflamed seborrheic keratosis: Secondary | ICD-10-CM | POA: Diagnosis not present

## 2017-02-04 DIAGNOSIS — L814 Other melanin hyperpigmentation: Secondary | ICD-10-CM | POA: Diagnosis not present

## 2017-02-04 NOTE — Therapy (Signed)
[facility] [address] [place], [place], [address] Phone: [phone]   Fax:  [phone]  Speech Language Pathology Treatment  Patient Details  Name: Rodney Love MRN: [id number] Date of Birth: 25-Nov-1945 Referring Provider: Alonza Bogus, D.O.   Encounter Date: 02/04/2017  End of Session - 02/04/17 1552    Visit Number  7    Number of Visits  17    Date for SLP Re-Evaluation  02/14/17    SLP Start Time  7408    SLP Stop Time   1448    SLP Time Calculation (min)  39 min    Activity Tolerance  Patient tolerated treatment well       Past Medical History:  Diagnosis Date  . Arthritis   . Constipation    OCCASIONAL  . Coronary artery disease   . Diabetes mellitus without complication ([place])   . Environmental allergies   . Hyperlipidemia   . Hypertension   . Inguinal hernia   . Nocturia   . Peripheral arterial disease [facility])     Past Surgical History:  Procedure Laterality Date  . BACK SURGERY    . CARDIAC CATHETERIZATION  05/29/2006   single vessel CAD w/moderate stenosis of the prox. RCA approx 40%,prox. LAD 20-30%,minor irreg. mid abdominal aorta,left iliac artery disease 50-60%  . CATARACT EXTRACTION Right   . HERNIA REPAIR  [id number]    There were no vitals filed for this visit.  Subjective Assessment - 02/04/17 1543    Subjective  (re: loud /a/ frequency) "Probably didn't do as as many as I should."    Patient is accompained by:  Family member daughter   daughter   Currently in Pain?  No/denies            ADULT SLP TREATMENT - 02/04/17 1544      General Information   Behavior/Cognition  Alert;Pleasant mood;Cooperative      Treatment Provided   Treatment provided  Cognitive-Linquistic      Cognitive-Linquistic Treatment   Treatment focused on  Dysarthria    Skilled Treatment  "I noticed they don't say 'huh?' as much." pt stated. Daughter agreed people are not having to ask pt to  repeat himself as much at home as prior to [place]. SLP facilitated pt's louder, NWLloudness speech today with loud "hey, ahhhhhhh" and pt average lower 90s dB. Structured speech tasks of 1-3 sentence responses were measured at low 70s dB. In a walk around the clinic in mod noisy environment, pt remained intelligible at 100%.       Assessment / Recommendations / Plan   Plan  Continue with current plan of care         SLP Short Term Goals - 01/31/17 1502      SLP SHORT TERM GOAL #1   Title  Pt will average 90dB on loud /a/ with rare min A over 5 sessions    Status  Partially Met      SLP SHORT TERM GOAL #2   Title  Pt will average in low 70s dB on 90% of sentence level structured speech tasks with rare min A, over 3 sessions    Status  Achieved      SLP SHORT TERM GOAL #3   Title  Pt will maintain average of 70dB during 8 minute simple conversation with rare min A over three sessions    Status  Not Met      SLP SHORT TERM GOAL #4  Title  pt will demo knowledge of overt s/s dysphagia with meals with modified independence    Time  2    Status  Deferred due to focus on speech    due to focus on speech       SLP Long Term Goals - 02/04/17 [place] #1   Title  Pt will maintain average of 70dB during 15 minute moderately complex conversation with rare min A over two sessions    Time  4    Period  Weeks    Status  On-going      SLP LONG TERM GOAL #2   Title  Pt. will maintain audible conversation over 12 minutes in noisy environment outside of therapy room over two sessions    Baseline  02-04-17    Time  4    Period  Weeks    Status  On-going      SLP LONG TERM GOAL #3   Title  pt will tell SLP 3 overt s/s aspiration PNA with modified independence    Time  4    Period  Weeks    Status  New       Plan - 02/04/17 1633    Clinical Impression Statement  Pt exhibiting cont'd WNL loudness today with mod complex phrase and sentence responses without cues from  SLP. With thought organization with more complex stimuli, pt did better at maintaining WNL loudness. Pt WNL loudness transferred out of ST room around the clinic and was 100% intelligible. Skilled ST focusing on speech loudness and breath support in conversation, across speaking situations will cont for at least 2-3 more sessions    Speech Therapy Frequency  2x / week    Duration  -- 8 weeks   8 weeks   Treatment/Interventions  Compensatory strategies;Patient/family education;Functional tasks;Cueing hierarchy;Compensatory techniques;Internal/external aids;SLP instruction and feedback    Potential to Achieve Goals  Good       Patient will benefit from skilled therapeutic intervention in order to improve the following deficits and impairments:   Dysarthria and anarthria    Problem List Patient Active Problem List   Diagnosis Date Noted  . PD (Parkinson's disease) ([place]) 12/18/2015  . Tremor of both hands 11/22/2015  . PAD (peripheral artery disease) s/p remote left external iliac artery angioplasty 10/28/2012  . Coronary atherosclerosis 10/28/2012  . Hyperlipidemia 10/28/2012  . HTN (hypertension) 10/28/2012  . DM2 (diabetes mellitus, type 2) ([place]) 10/28/2012  . Carotid occlusion, right 10/28/2012    [facility] ,MS, CCC-SLP  02/04/2017, 4:36 PM  [facility] [address] [place], [place], [address] Phone: [phone]   Fax:  [phone]   Name: Rodney Love MRN: [id number] Date of Birth: 12/16/1945

## 2017-02-04 NOTE — Patient Instructions (Signed)
     HEY! (breathe) "Ahhhhhhhhh-----------------------------------------"  Do that 5-7 times, twice a day

## 2017-02-04 NOTE — Therapy (Signed)
[facility] [address] [place], [place], [address] Phone: [phone]   Fax:  [phone]  Occupational Therapy Treatment  Patient Details  Name: Rodney Love MRN: [id number] Date of Birth: [birth year] Referring Provider: Dr. Wells Guiles Tat    Encounter Date: 02/04/2017  OT End of Session - 02/04/17 1454    Visit Number  10    Number of Visits  17    Date for OT Re-Evaluation  02/03/17    Authorization Time Period  G code next visit    Authorization - Visit Number  10    Authorization - Number of Visits  10    OT Start Time  1452    OT Stop Time  1531    OT Time Calculation (min)  39 min    Activity Tolerance  Patient tolerated treatment well    Behavior During Therapy  [facility] for tasks assessed/performed       Past Medical History:  Diagnosis Date  . Arthritis   . Constipation    OCCASIONAL  . Coronary artery disease   . Diabetes mellitus without complication ([place])   . Environmental allergies   . Hyperlipidemia   . Hypertension   . Inguinal hernia   . Nocturia   . Peripheral arterial disease [facility])     Past Surgical History:  Procedure Laterality Date  . BACK SURGERY    . CARDIAC CATHETERIZATION  05/29/2006   single vessel CAD w/moderate stenosis of the prox. RCA approx 40%,prox. LAD 20-30%,minor irreg. mid abdominal aorta,left iliac artery disease 50-60%  . CATARACT EXTRACTION Right   . HERNIA REPAIR  [id number]    There were no vitals filed for this visit.  Subjective Assessment - 02/04/17 1453    Subjective   Denies pain    Pertinent History  Parkinson's disease; PAD, HTN, DM, hyperlipedemia, hx of low back pain    Limitations  fall risk    Patient Stated Goals  improve buttoning, donning shoes/socks    Currently in Pain?  No/denies              Treatment: Therapist checked progress towards goals. therapist to renew pt for several more visits to address the following deficits: bradykinesia,  abnormal posture, deceased coordination, cognitive deficits and decreased functional mobility in order to maximize pt's safety and independence with ADLS/ IADLS.See goals for updates. Reveiwed PWR!up rock and step in seated10 reps each. Standing for dynamic rock and reach. Copying small peg design with LUE for increased fine motor coordination, min difficulty/ v.c Reviewed strategies for fastening / unfastening buttons, mod v.c               OT Short Term Goals - 02/04/17 1503      OT SHORT TERM GOAL #1   Title  Pt will be independent with updated HEP.--check 01/24/17    Status  Achieved      OT SHORT TERM GOAL #2   Title  Pt will improve L hand coordination for ADLs as shown by improving time on 9-hole peg test by at least 5sec.    Status  Achieved      OT SHORT TERM GOAL #3   Title  Pt will be able to don shoes/sock mod I.    Status  Achieved      OT SHORT TERM GOAL #4   Title  Pt will verbalize understanding of appropriate community resources and ways to prevent future complications related to PD.    Status  Achieved        OT Long Term Goals - 03-Mar-2017 1503      OT LONG TERM GOAL #1   Title  Pt will verbalize understanding of adaptive strategies for ADLs/IADLs to incr ease, incr safety, and decr risk of pain.--check LTGs 02/03/17    Time  8    Period  Weeks    Status  On-going      OT LONG TERM GOAL #2   Title  Pt will improve bilateral functional reaching/coordination as shown by improving score on box and blocks test by at least 3 with RUE and at least 5 with LUE>    Baseline  R-47 blocks, L-41 blocks    Time  8    Period  Weeks    Status  On-going 01/21/17:  Partially met, met with LUE R-49, L-50 blocks   01/21/17:  Partially met, met with LUE R-49, L-50 blocks     OT LONG TERM GOAL #3   Title  Pt will be able to fasten/unfasten 3 buttons in less than 60sec.    Baseline  ---    Time  8    Period  Weeks    Status  On-going 62 secs, 40.28- not  consistent   62 secs, 40.28- not consistent     OT LONG TERM GOAL #4   Title  Pt will improve balance/functional reaching for ADLs/IADLs as shown by improving standing functional reach to 10 inches or greater bilaterally    Baseline  R-9", L-9.5"    Time  8    Period  Weeks    Status  Achieved 10 inches bilaterally   10 inches bilaterally     OT LONG TERM GOAL #5   Title  Pt will improve L hand coordination for ADLs as shown by improving time on 9-hole peg test by at least 10sec.    Baseline  43.91sec    Time  8    Period  Weeks    Status  Achieved 01/21/17:  32.46sec   01/21/17:  32.46sec           Plan - 2017/03/03 1611    Clinical Impression Statement  Pt is progressing towards goals. Pt can benefit from continued skilled occupational therapy to address unmet goals and complete patient education.    Rehab Potential  Good    OT Frequency  2x / week    OT Duration  8 weeks    OT Treatment/Interventions  Self-care/ADL training;Cryotherapy;Therapeutic exercise;DME and/or AE instruction;Therapist, nutritional;Therapeutic activities;Patient/family education;Balance training;Cognitive remediation/compensation;Manual Therapy;Neuromuscular education;Moist Heat;Energy conservation;Passive range of motion;Therapeutic exercises    Plan  continue to work towards unmet goals, anticipate d/c next week    Consulted and Agree with Plan of Care  Patient    Family Member Consulted  daughter       Patient will benefit from skilled therapeutic intervention in order to improve the following deficits and impairments:  Decreased balance, Decreased mobility, Decreased activity tolerance, Decreased coordination, Decreased knowledge of precautions, Decreased safety awareness, Impaired tone, Improper body mechanics, Impaired UE functional use, Decreased range of motion, Decreased knowledge of use of DME  Visit Diagnosis: Other symptoms and signs involving the nervous system  Other symptoms and  signs involving the musculoskeletal system  Other lack of coordination  Stiffness of left shoulder, not elsewhere classified  G-Codes - 03-Mar-2017 1612    Functional Assessment Tool Used (Outpatient only)  fastening/unfastening 3 buttons : 62 secs, 40.28 secs  Standing functional reach:  RUE 10 inches, LUE 10 inches    Self Care Current Status (S3159)  At least 20 percent but less than 40 percent impaired, limited or restricted    Self Care Goal Status (Y5859)  At least 1 percent but less than 20 percent impaired, limited or restricted       Problem List Patient Active Problem List   Diagnosis Date Noted  . PD (Parkinson's disease) ([place]) 12/18/2015  . Tremor of both hands 11/22/2015  . PAD (peripheral artery disease) s/p remote left external iliac artery angioplasty 10/28/2012  . Coronary atherosclerosis 10/28/2012  . Hyperlipidemia 10/28/2012  . HTN (hypertension) 10/28/2012  . DM2 (diabetes mellitus, type 2) ([place]) 10/28/2012  . Carotid occlusion, right 10/28/2012    Grayton Lobo 02/04/2017, 4:19 PM  [facility] [address] [place], [place], [address] Phone: [phone]   Fax:  [phone]  Name: Rodney Love MRN: [id number] Date of Birth: May 31, 1945

## 2017-02-05 NOTE — Therapy (Signed)
[facility] [address] [place], [place], [address] Phone: [phone]   Fax:  [phone]  Physical Therapy Treatment  Patient Details  Name: Rodney Love MRN: [id number] Date of Birth: [birth year] Referring Provider: Alonza Bogus   Encounter Date: 02/04/2017  PT End of Session - 02/05/17 1213    Visit Number  8    Number of Visits  12    Date for PT Re-Evaluation  03/05/17    Authorization Type  [employer]HC Medicare-GCODE every 10th visit    PT Start Time  1403    PT Stop Time  1445    PT Time Calculation (min)  42 min    Activity Tolerance  Patient tolerated treatment well    Behavior During Therapy  [facility] for tasks assessed/performed       Past Medical History:  Diagnosis Date  . Arthritis   . Constipation    OCCASIONAL  . Coronary artery disease   . Diabetes mellitus without complication ([place])   . Environmental allergies   . Hyperlipidemia   . Hypertension   . Inguinal hernia   . Nocturia   . Peripheral arterial disease [facility])     Past Surgical History:  Procedure Laterality Date  . BACK SURGERY    . CARDIAC CATHETERIZATION  05/29/2006   single vessel CAD w/moderate stenosis of the prox. RCA approx 40%,prox. LAD 20-30%,minor irreg. mid abdominal aorta,left iliac artery disease 50-60%  . CATARACT EXTRACTION Right   . HERNIA REPAIR  [id number]    There were no vitals filed for this visit.  Subjective Assessment - 02/04/17 1407    Subjective  Feel like I'm doing everything I need to do at home, except I get tired with raking leaves.    Patient is accompained by:  Family member    Pertinent History  ruptured disk in back (years ago per pt report), OA, DM, CAD, HTN, PAD    Patient Stated Goals  Pt's goals are to be able to walk where I want to, when I want to.    Currently in Pain?  No/denies                      [facility] Adult PT Treatment/Exercise - 02/05/17 0001      Timed Up and Go Test   Normal TUG (seconds)  11.65    Cognitive TUG (seconds)  12.31      High Level Balance   High Level Balance Comments  MiniBESTest score:  22/30 (1 on rise to toes, 1 on SLS, 1 on EC/foam surfac)-improved from 20/30      Self-Care   Self-Care  Other Self-Care Comments    Other Self-Care Comments   discussed progress towards goals, discussed outdoor gait and yard activities, with pt reporting balance on compliant/outdoor surfaces is still a concern.  Discussed continueing PT for 1-2 additional visits to formally update HEP to include compliant surface (corner balance exercises).  Discussed/reviewed fall prevention informaiton (in patient's notebook from previous bout of therapy)-with pt and daughter-with emphasis on pt taking cell phone while outdoors for improved safety.          Balance Exercises - 02/05/17 1204      Balance Exercises: Standing   Standing Eyes Opened  Wide (BOA);Narrow base of support (BOS);Foam/compliant surface;Head turns Head nods, 5 reps each   Head nods, 5 reps each   Standing Eyes Closed  Wide (BOA);Narrow base of support (BOS);Foam/compliant surface;2 reps;10 secs  Other Standing Exercises  Standing on compliant surface:  marching in place x 10, forward kicks x 10 reps, forward step taps x 10 reps.  Standing activities on blue compliant mat surface:  wide BOS with lateral weightshifting, stagger stance forward/back weightshifting.  Simulated raking task, with therapist providing manual cues for weightshifting, due to pt tending to have increased back flexion and not shifting weight through lower extremities.        PT Education - 02/05/17 1213    Education provided  Yes    Education Details  fall prevention, progress towards goals, plans to continue PT for 1-2 visits for updates to HEP    Person(s) Educated  Patient;Child(ren)    Methods  Explanation    Comprehension  Verbalized understanding       PT Short Term Goals - 01/14/17 1729      PT SHORT TERM  GOAL #1   Title  Pt will be independent with HEP to address Parkinson's disease-related deficits.    Baseline  01/14/17-pt returned after missing 2+weeks due to cataract surgery. Reports he is "just getting back into doing" HEP    Time  4    Period  Weeks    Status  Not Met      PT SHORT TERM GOAL #2   Title  Pt will perform at least 8 of 10 reps of sit<>stand from lower than 18 inch surfaces, minimal to no UE support, independently, for improved ease of transfers.    Time  4    Period  Weeks    Status  Achieved      PT SHORT TERM GOAL #3   Title  Pt will improveTUG cognitive score to less than or equal to 15 seconds for decreased fall risk.    Baseline  10/16  14.04 sec    Time  4    Period  Weeks    Status  Achieved      PT SHORT TERM GOAL #4   Title  Pt will improve SLS to at least 3 seconds each leg, for improved obstacle and step negotiation.    Baseline  10/16 RLE 4.13 sec, LLE 4.17 sec    Time  4    Period  Weeks    Status  Achieved        PT Long Term Goals - 02/04/17 1408      PT LONG TERM GOAL #1   Title  Pt will verbalize understanding of fall prevention in home environment.      Time  6    Period  Weeks    Status  Achieved      PT LONG TERM GOAL #2   Title  Pt will improve TUG and TUG cog score to within <10% difference, for improved dual tasking, decreased fall risk.    Time  6    Period  Weeks    Status  Achieved      PT LONG TERM GOAL #3   Title  Pt will improve MiniBESTest score to at least 23/28 for decreased fall risk.    Baseline  Score 22/28    Time  6    Period  Weeks    Status  Not Met      PT LONG TERM GOAL #4   Title  Pt will verbalize plans for continued community fitness upon d/c from PT.    Time  6 2 additional weeks   2 additional weeks   Period  Weeks  Status  On-going    Target Date  02/21/17      PT LONG TERM GOAL #5   Title  Pt will be independent with updates to HEP for compliant surface balance exercises.    Time  2     Period  Weeks    Status  New    Target Date  02/21/17            Plan - 02/05/17 1215    Clinical Impression Statement  Assessed LTGs today, with pt meeting LTG 1 and 2.  LTG 3 not met, with MiniBESTest score 22/28, improved from 20/28.  LTG 4 ongoing, with LTG 5 added to specifically address compliant surface balance HEP updates.  Pt overall reports he is doing well at home, except for balance at times on outdoor surfaces, therefore, will plan to address with updates to HEP.    Rehab Potential  Good    PT Frequency  2x / week    PT Duration  2 weeks per recert 18/5/50   per recert 15/8/68   PT Treatment/Interventions  ADLs/Self Care Home Management;Functional mobility training;Gait training;Therapeutic activities;Therapeutic exercise;Balance training;Neuromuscular re-education;Patient/family education    PT Next Visit Plan  Recert this visit for 2 additional weeks (1-2 visits); plan to add corner balance compliant surface exercises to HEP to address compliant surface balance issues    Consulted and Agree with Plan of Care  Patient;Family member/caregiver    Family Member Consulted  daughter       Patient will benefit from skilled therapeutic intervention in order to improve the following deficits and impairments:  Abnormal gait, Decreased balance, Decreased mobility, Decreased strength, Difficulty walking, Postural dysfunction, Impaired tone  Visit Diagnosis: Other abnormalities of gait and mobility  Unsteadiness on feet  Other symptoms and signs involving the nervous system  Abnormal posture     Problem List Patient Active Problem List   Diagnosis Date Noted  . PD (Parkinson's disease) ([place]) 12/18/2015  . Tremor of both hands 11/22/2015  . PAD (peripheral artery disease) s/p remote left external iliac artery angioplasty 10/28/2012  . Coronary atherosclerosis 10/28/2012  . Hyperlipidemia 10/28/2012  . HTN (hypertension) 10/28/2012  . DM2 (diabetes mellitus, type 2)  ([place] [employer]) 10/28/2012  . Carotid occlusion, right 10/28/2012    Leticia Mcdiarmid W. 02/05/2017, 12:19 PM  Frazier Butt., PT   [facility] [address] [place], [place], [address] Phone: [phone]   Fax:  [phone]  Name: CONLAN MICELI MRN: [id number] Date of Birth: [birth year]

## 2017-02-06 ENCOUNTER — Encounter: Payer: Self-pay | Admitting: Occupational Therapy

## 2017-02-06 ENCOUNTER — Ambulatory Visit: Payer: Medicare Other | Admitting: Occupational Therapy

## 2017-02-06 ENCOUNTER — Encounter: Payer: Self-pay | Admitting: Physical Therapy

## 2017-02-06 ENCOUNTER — Ambulatory Visit: Payer: Medicare Other | Admitting: Physical Therapy

## 2017-02-06 DIAGNOSIS — R2681 Unsteadiness on feet: Secondary | ICD-10-CM

## 2017-02-06 DIAGNOSIS — R29818 Other symptoms and signs involving the nervous system: Secondary | ICD-10-CM

## 2017-02-06 DIAGNOSIS — R29898 Other symptoms and signs involving the musculoskeletal system: Secondary | ICD-10-CM | POA: Diagnosis not present

## 2017-02-06 DIAGNOSIS — R293 Abnormal posture: Secondary | ICD-10-CM

## 2017-02-06 DIAGNOSIS — R2689 Other abnormalities of gait and mobility: Secondary | ICD-10-CM | POA: Diagnosis not present

## 2017-02-06 DIAGNOSIS — M25612 Stiffness of left shoulder, not elsewhere classified: Secondary | ICD-10-CM

## 2017-02-06 DIAGNOSIS — R278 Other lack of coordination: Secondary | ICD-10-CM | POA: Diagnosis not present

## 2017-02-06 DIAGNOSIS — R471 Dysarthria and anarthria: Secondary | ICD-10-CM | POA: Diagnosis not present

## 2017-02-06 NOTE — Therapy (Signed)
[facility] [address] [place], [place], [address] Phone: [phone]   Fax:  [phone]  Occupational Therapy Treatment  Patient Details  Name: Rodney Love MRN: [id number] Date of Birth: 10/30/1945 Referring Provider: Dr. Wells Guiles Tat    Encounter Date: 02/06/2017  OT End of Session - 02/06/17 1458    Visit Number  11    Number of Visits  17    Date for OT Re-Evaluation  02/03/17    Authorization Time Period  G code next visit    Authorization - Visit Number  11    Authorization - Number of Visits  20    OT Start Time  1451    OT Stop Time  1530    OT Time Calculation (min)  39 min    Activity Tolerance  Patient tolerated treatment well    Behavior During Therapy  [facility] for tasks assessed/performed       Past Medical History:  Diagnosis Date  . Arthritis   . Constipation    OCCASIONAL  . Coronary artery disease   . Diabetes mellitus without complication ([place])   . Environmental allergies   . Hyperlipidemia   . Hypertension   . Inguinal hernia   . Nocturia   . Peripheral arterial disease [facility])     Past Surgical History:  Procedure Laterality Date  . BACK SURGERY    . CARDIAC CATHETERIZATION  05/29/2006   single vessel CAD w/moderate stenosis of the prox. RCA approx 40%,prox. LAD 20-30%,minor irreg. mid abdominal aorta,left iliac artery disease 50-60%  . CATARACT EXTRACTION Right   . HERNIA REPAIR  [id number]    There were no vitals filed for this visit.  Subjective Assessment - 02/06/17 1453    Subjective   Raked leaves yesterday and is a little sore in the hips    Pertinent History  Parkinson's disease; PAD, HTN, DM, hyperlipedemia, hx of low back pain    Limitations  fall risk    Patient Stated Goals  improve buttoning, donning shoes/socks    Currently in Pain?  No/denies        Placing grooved pegs in pegboard and removing with each hand with min difficulty with L hand and cueing for use of  PWR! Hands.  In standing, simulated raking with min cueing for keeping feet apart more for improved balance and incr wt. Shift/UE movement.  In standing, functional reaching with LUE to place small pegs in vertical pegboard to copy small peg design for cognitive component and min cueing for use of [employer]WR! Hands and to keep feet apart.  Incorporating wt. Shift and trunk rotation.  Pt with 1 spill due to smaller movements with repetition and needed cueing for incr trunk rotation.  In standing, tossing scarves with each hand incorporating trunk rotation, wt. Shift, PWR! Reach/hands with mod cueing.  Arm bike x31mn level 1 for reciprocal movement with cues/target of at least 40rpms for intensity while maintaining movement amplitude/reciprocal movement.   Pt maintained 42-46 rpms.                       OT Short Term Goals - 02/04/17 1503      OT SHORT TERM GOAL #1   Title  Pt will be independent with updated HEP.--check 01/24/17    Status  Achieved      OT SHORT TERM GOAL #2   Title  Pt will improve L hand coordination for ADLs as shown by improving  time on 9-hole peg test by at least 5sec.    Status  Achieved      OT SHORT TERM GOAL #3   Title  Pt will be able to don shoes/sock mod I.    Status  Achieved      OT SHORT TERM GOAL #4   Title  Pt will verbalize understanding of appropriate community resources and ways to prevent future complications related to PD.    Status  Achieved        OT Long Term Goals - 02/04/17 1503      OT LONG TERM GOAL #1   Title  Pt will verbalize understanding of adaptive strategies for ADLs/IADLs to incr ease, incr safety, and decr risk of pain.--check LTGs 02/03/17    Time  8    Period  Weeks    Status  On-going      OT LONG TERM GOAL #2   Title  Pt will improve bilateral functional reaching/coordination as shown by improving score on box and blocks test by at least 3 with RUE and at least 5 with LUE>    Baseline  R-47 blocks, L-41  blocks    Time  8    Period  Weeks    Status  On-going 01/21/17:  Partially met, met with LUE R-49, L-50 blocks      OT LONG TERM GOAL #3   Title  Pt will be able to fasten/unfasten 3 buttons in less than 60sec.    Baseline  ---    Time  8    Period  Weeks    Status  On-going 62 secs, 40.28- not consistent      OT LONG TERM GOAL #4   Title  Pt will improve balance/functional reaching for ADLs/IADLs as shown by improving standing functional reach to 10 inches or greater bilaterally    Baseline  R-9", L-9.5"    Time  8    Period  Weeks    Status  Achieved 10 inches bilaterally      OT LONG TERM GOAL #5   Title  Pt will improve L hand coordination for ADLs as shown by improving time on 9-hole peg test by at least 10sec.    Baseline  43.91sec    Time  8    Period  Weeks    Status  Achieved 01/21/17:  32.46sec            Plan - 02/06/17 1459    Clinical Impression Statement  Pt is progressing towards goals with improving movement amplitude.      Rehab Potential  Good    OT Frequency  2x / week    OT Duration  8 weeks    OT Treatment/Interventions  Self-care/ADL training;Cryotherapy;Therapeutic exercise;DME and/or AE instruction;Therapist, nutritional;Therapeutic activities;Patient/family education;Balance training;Cognitive remediation/compensation;Manual Therapy;Neuromuscular education;Moist Heat;Energy conservation;Passive range of motion;Therapeutic exercises    Plan  anticipate d/c next week    Consulted and Agree with Plan of Care  Patient    Family Member Consulted  daughter       Patient will benefit from skilled therapeutic intervention in order to improve the following deficits and impairments:  Decreased balance, Decreased mobility, Decreased activity tolerance, Decreased coordination, Decreased knowledge of precautions, Decreased safety awareness, Impaired tone, Improper body mechanics, Impaired UE functional use, Decreased range of motion, Decreased knowledge  of use of DME  Visit Diagnosis: Other symptoms and signs involving the nervous system  Other symptoms and signs involving the musculoskeletal system  Other  lack of coordination  Stiffness of left shoulder, not elsewhere classified  Unsteadiness on feet  Other abnormalities of gait and mobility  Abnormal posture  Stiffness of joint, shoulder region, left    Problem List Patient Active Problem List   Diagnosis Date Noted  . PD (Parkinson's disease) ([place]) 12/18/2015  . Tremor of both hands 11/22/2015  . PAD (peripheral artery disease) s/p remote left external iliac artery angioplasty 10/28/2012  . Coronary atherosclerosis 10/28/2012  . Hyperlipidemia 10/28/2012  . HTN (hypertension) 10/28/2012  . DM2 (diabetes mellitus, type 2) ([place]) 10/28/2012  . Carotid occlusion, right 10/28/2012    [facility] 02/06/2017, 5:10 PM  [facility] [address] [place], [place], [address] Phone: [phone]   Fax:  [phone]  Name: Rodney Love MRN: [id number] Date of Birth: [birth year]   Vianne Bulls, OTR/L [facility] [address] [place], [place]  [address] [phone] phone [phone] 02/06/17 5:10 PM

## 2017-02-06 NOTE — Therapy (Signed)
[facility] [address] [place], [place], [address] Phone: [phone]   Fax:  [phone]  Physical Therapy Treatment  Patient Details  Name: Rodney Love MRN: [id number] Date of Birth: [birth year] Referring Provider: Alonza Bogus   Encounter Date: 02/06/2017  PT End of Session - 02/06/17 1638    Visit Number  9    Number of Visits  12    Date for PT Re-Evaluation  03/05/17    Authorization Type  [employer]HC Medicare-GCODE every 10th visit    PT Start Time  1534    PT Stop Time  1615    PT Time Calculation (min)  41 min    Activity Tolerance  Patient tolerated treatment well    Behavior During Therapy  [facility] for tasks assessed/performed       Past Medical History:  Diagnosis Date  . Arthritis   . Constipation    OCCASIONAL  . Coronary artery disease   . Diabetes mellitus without complication ([place])   . Environmental allergies   . Hyperlipidemia   . Hypertension   . Inguinal hernia   . Nocturia   . Peripheral arterial disease [facility])     Past Surgical History:  Procedure Laterality Date  . BACK SURGERY    . CARDIAC CATHETERIZATION  05/29/2006   single vessel CAD w/moderate stenosis of the prox. RCA approx 40%,prox. LAD 20-30%,minor irreg. mid abdominal aorta,left iliac artery disease 50-60%  . CATARACT EXTRACTION Right   . HERNIA REPAIR  [id number]    There were no vitals filed for this visit.  Subjective Assessment - 02/06/17 1534    Subjective  I did some raking leaves yesterday (3 hrs) and may have been a little easier.     Patient is accompained by:  Family member    Pertinent History  ruptured disk in back (years ago per pt report), OA, DM, CAD, HTN, PAD    Patient Stated Goals  Pt's goals are to be able to walk where I want to, when I want to.    Currently in Pain?  No/denies                      [facility] Adult PT Treatment/Exercise - 02/06/17 1622      Transfers   Sit to Stand  6:  Modified independent (Device/Increase time)    Stand to Sit  6: Modified independent (Device/Increase time);Without upper extremity assist;To chair/3-in-1;To bed      Ambulation/Gait   Ambulation/Gait  Yes    Ambulation/Gait Assistance  5: Supervision    Ambulation/Gait Assistance Details  vc for incr stride length, heel strike, velocity (to incr arm swing); pt able to maintain changes for up to 40 ft at a time    Ambulation Distance (Feet)  480 Feet 240    Assistive device  None    Gait Pattern  Step-through pattern;Decreased arm swing - left;Decreased trunk rotation    Ambulation Surface  Indoor    Pre-Gait Activities  arm swing activities with/without incorporating RLE step; emphasizing backswing of LUE as well as forward      Exercises   Exercises  Other Exercises    Other Exercises   at counter and then seated trunk rotation/stretching between bouts of walking with slight improvement in arm swing afterward          Balance Exercises - 02/06/17 1545      Balance Exercises: Standing   Standing Eyes Opened  Narrow base  of support (BOS);Wide (BOA);Head turns;Foam/compliant surface    Standing Eyes Closed  Narrow base of support (BOS);Wide (BOA);Foam/compliant surface        PT Education - 02/06/17 1636    Education provided  Yes    Education Details  addition to HEP    Person(s) Educated  Patient    Methods  Explanation;Demonstration    Comprehension  Verbalized understanding;Returned demonstration;Verbal cues required;Need further instruction       PT Short Term Goals - 01/14/17 1729      PT SHORT TERM GOAL #1   Title  Pt will be independent with HEP to address Parkinson's disease-related deficits.    Baseline  01/14/17-pt returned after missing 2+weeks due to cataract surgery. Reports he is "just getting back into doing" HEP    Time  4    Period  Weeks    Status  Not Met      PT SHORT TERM GOAL #2   Title  Pt will perform at least 8 of 10 reps of sit<>stand from  lower than 18 inch surfaces, minimal to no UE support, independently, for improved ease of transfers.    Time  4    Period  Weeks    Status  Achieved      PT SHORT TERM GOAL #3   Title  Pt will improveTUG cognitive score to less than or equal to 15 seconds for decreased fall risk.    Baseline  10/16  14.04 sec    Time  4    Period  Weeks    Status  Achieved      PT SHORT TERM GOAL #4   Title  Pt will improve SLS to at least 3 seconds each leg, for improved obstacle and step negotiation.    Baseline  10/16 RLE 4.13 sec, LLE 4.17 sec    Time  4    Period  Weeks    Status  Achieved        PT Long Term Goals - 02/04/17 1408      PT LONG TERM GOAL #1   Title  Pt will verbalize understanding of fall prevention in home environment.      Time  6    Period  Weeks    Status  Achieved      PT LONG TERM GOAL #2   Title  Pt will improve TUG and TUG cog score to within <10% difference, for improved dual tasking, decreased fall risk.    Time  6    Period  Weeks    Status  Achieved      PT LONG TERM GOAL #3   Title  Pt will improve MiniBESTest score to at least 23/28 for decreased fall risk.    Baseline  Score 22/28    Time  6    Period  Weeks    Status  Not Met      PT LONG TERM GOAL #4   Title  Pt will verbalize plans for continued community fitness upon d/c from PT.    Time  6 2 additional weeks    Period  Weeks    Status  On-going    Target Date  02/21/17      PT LONG TERM GOAL #5   Title  Pt will be independent with updates to HEP for compliant surface balance exercises.    Time  2    Period  Weeks    Status  New    Target Date  02/21/17            Plan - 02/06/17 1640    Clinical Impression Statement  Session focused on balance training to add to his HEP, gait training, and stretching. Patient progressed through wide BOS on foam EO to partial tandem on foam EC. Added balance exercises to HEP per primary PT recommendation. Can benefit from 1-2 more visits to  finalize HEP with recent additions.     Rehab Potential  Good    PT Frequency  2x / week    PT Duration  2 weeks per recert 48/1/85    PT Treatment/Interventions  ADLs/Self Care Home Management;Functional mobility training;Gait training;Therapeutic activities;Therapeutic exercise;Balance training;Neuromuscular re-education;Patient/family education    PT Next Visit Plan  check 11/8 added corner balance compliant surface exercises to HEP; work on compliant surface activities for balance issues    Consulted and Agree with Plan of Care  Patient;Family member/caregiver    Family Member Consulted  daughter       Patient will benefit from skilled therapeutic intervention in order to improve the following deficits and impairments:  Abnormal gait, Decreased balance, Decreased mobility, Decreased strength, Difficulty walking, Postural dysfunction, Impaired tone  Visit Diagnosis: Other abnormalities of gait and mobility  Unsteadiness on feet     Problem List Patient Active Problem List   Diagnosis Date Noted  . PD (Parkinson's disease) ([place]) 12/18/2015  . Tremor of both hands 11/22/2015  . PAD (peripheral artery disease) s/p remote left external iliac artery angioplasty 10/28/2012  . Coronary atherosclerosis 10/28/2012  . Hyperlipidemia 10/28/2012  . HTN (hypertension) 10/28/2012  . DM2 (diabetes mellitus, type 2) ([place]) 10/28/2012  . Carotid occlusion, right 10/28/2012    Rexanne Mano, PT 02/06/2017, 4:51 PM  [facility] [address] [place], [place], [address] Phone: [phone]   Fax:  [phone]  Name: Rodney Love MRN: [id number] Date of Birth: [birth year]

## 2017-02-06 NOTE — Patient Instructions (Signed)
Feet Partial Heel-Toe     With eyes open, standing on pillows/folded blanket:  right foot partially in front of the other, move head slowly: up and down x 10 reps, then turn head left and right x 10 reps. Then focus forward and close your eyes for 10-30 seconds.  Do every day.    Copyright  VHI. All rights reserved.

## 2017-02-10 ENCOUNTER — Ambulatory Visit: Payer: Medicare Other | Admitting: Speech Pathology

## 2017-02-11 ENCOUNTER — Ambulatory Visit: Payer: Medicare Other | Admitting: Physical Therapy

## 2017-02-11 ENCOUNTER — Ambulatory Visit: Payer: Medicare Other | Admitting: Occupational Therapy

## 2017-02-11 ENCOUNTER — Encounter: Payer: Self-pay | Admitting: Physical Therapy

## 2017-02-11 DIAGNOSIS — R293 Abnormal posture: Secondary | ICD-10-CM | POA: Diagnosis not present

## 2017-02-11 DIAGNOSIS — M25612 Stiffness of left shoulder, not elsewhere classified: Secondary | ICD-10-CM

## 2017-02-11 DIAGNOSIS — R29818 Other symptoms and signs involving the nervous system: Secondary | ICD-10-CM

## 2017-02-11 DIAGNOSIS — R2681 Unsteadiness on feet: Secondary | ICD-10-CM

## 2017-02-11 DIAGNOSIS — R2689 Other abnormalities of gait and mobility: Secondary | ICD-10-CM

## 2017-02-11 DIAGNOSIS — R278 Other lack of coordination: Secondary | ICD-10-CM | POA: Diagnosis not present

## 2017-02-11 DIAGNOSIS — R29898 Other symptoms and signs involving the musculoskeletal system: Secondary | ICD-10-CM | POA: Diagnosis not present

## 2017-02-11 DIAGNOSIS — R471 Dysarthria and anarthria: Secondary | ICD-10-CM | POA: Diagnosis not present

## 2017-02-11 NOTE — Therapy (Signed)
[facility] [address] [place], [place], [address] Phone: [phone]   Fax:  [phone]  Physical Therapy Treatment  Patient Details  Name: Rodney Love MRN: [id number] Date of Birth: Feb 29, 1946 Referring Provider: Alonza Bogus   Encounter Date: 02/11/2017  PT End of Session - 02/11/17 1414    Visit Number  10    Number of Visits  12    Date for PT Re-Evaluation  03/05/17    Authorization Type  UHC Medicare-GCODE every 10th visit    PT Start Time  1316    PT Stop Time  1357    PT Time Calculation (min)  41 min    Equipment Utilized During Treatment  Gait belt    Activity Tolerance  Patient tolerated treatment well    Behavior During Therapy  [facility] for tasks assessed/performed       Past Medical History:  Diagnosis Date  . Arthritis   . Constipation    OCCASIONAL  . Coronary artery disease   . Diabetes mellitus without complication ([place])   . Environmental allergies   . Hyperlipidemia   . Hypertension   . Inguinal hernia   . Nocturia   . Peripheral arterial disease [facility])     Past Surgical History:  Procedure Laterality Date  . BACK SURGERY    . CARDIAC CATHETERIZATION  05/29/2006   single vessel CAD w/moderate stenosis of the prox. RCA approx 40%,prox. LAD 20-30%,minor irreg. mid abdominal aorta,left iliac artery disease 50-60%  . CATARACT EXTRACTION Right   . HERNIA REPAIR  [id number]    There were no vitals filed for this visit.  Subjective Assessment - 02/11/17 1319    Subjective  Not doing my exercises as often as I should.     Patient is accompained by:  Family member    Pertinent History  ruptured disk in back (years ago per pt report), OA, DM, CAD, HTN, PAD    Patient Stated Goals  Pt's goals are to be able to walk where I want to, when I want to.                      [facility] PT Treatment/Exercise - 02/11/17 1359      Ambulation/Gait   Ambulation/Gait Assistance  5:  Supervision    Ambulation/Gait Assistance Details  vc for upright posture and forward gaze    Ambulation Distance (Feet)  480 Feet 480    Assistive device  None    Gait Pattern  Step-through pattern;Decreased arm swing - left;Decreased trunk rotation    Ambulation Surface  Indoor    Pre-Gait Activities  slow walking with big arm swings      Posture/Postural Control   Posture/Postural Control  Postural limitations    Postural Limitations  Rounded Shoulders;Forward head      Knee/Hip Exercises: Standing   Lateral Step Up  Both;Hand Hold: 1;Hand Hold: 0;Step Height: 4"          Balance Exercises - 02/11/17 1410      Balance Exercises: Standing   Standing Eyes Opened  Narrow base of support (BOS);Head turns;Foam/compliant surface feet together, semi-tandem each way    SLS with Vectors  Foam/compliant surface heelstrike on colored spots and foam bubbles    Stepping Strategy  Anterior;Posterior;Lateral;Foam/compliant surface;10 reps    Tandem Gait  Forward;4 reps // bars    Retro Gait  4 reps        PT Education - 02/11/17  1413    Education provided  Yes    Education Details  importance of continuing activity upon d/c; ?benefit from exercise grid to plan his daily ex's-reports he did not really use the last one (didnt work for him)    Forensic psychologist) Educated  Patient    Methods  Explanation    Comprehension  Verbalized understanding       PT Short Term Goals - 01/14/17 1729      PT SHORT TERM GOAL #1   Title  Pt will be independent with HEP to address Parkinson's disease-related deficits.    Baseline  01/14/17-pt returned after missing 2+weeks due to cataract surgery. Reports he is "just getting back into doing" HEP    Time  4    Period  Weeks    Status  Not Met      PT SHORT TERM GOAL #2   Title  Pt will perform at least 8 of 10 reps of sit<>stand from lower than 18 inch surfaces, minimal to no UE support, independently, for improved ease of transfers.    Time  4    Period   Weeks    Status  Achieved      PT SHORT TERM GOAL #3   Title  Pt will improveTUG cognitive score to less than or equal to 15 seconds for decreased fall risk.    Baseline  10/16  14.04 sec    Time  4    Period  Weeks    Status  Achieved      PT SHORT TERM GOAL #4   Title  Pt will improve SLS to at least 3 seconds each leg, for improved obstacle and step negotiation.    Baseline  10/16 RLE 4.13 sec, LLE 4.17 sec    Time  4    Period  Weeks    Status  Achieved        PT Long Term Goals - 02/04/17 1408      PT LONG TERM GOAL #1   Title  Pt will verbalize understanding of fall prevention in home environment.      Time  6    Period  Weeks    Status  Achieved      PT LONG TERM GOAL #2   Title  Pt will improve TUG and TUG cog score to within <10% difference, for improved dual tasking, decreased fall risk.    Time  6    Period  Weeks    Status  Achieved      PT LONG TERM GOAL #3   Title  Pt will improve MiniBESTest score to at least 23/28 for decreased fall risk.    Baseline  Score 22/28    Time  6    Period  Weeks    Status  Not Met      PT LONG TERM GOAL #4   Title  Pt will verbalize plans for continued community fitness upon d/c from PT.    Time  6 2 additional weeks    Period  Weeks    Status  On-going    Target Date  02/21/17      PT LONG TERM GOAL #5   Title  Pt will be independent with updates to HEP for compliant surface balance exercises.    Time  2    Period  Weeks    Status  New    Target Date  02/21/17  Plan - 13-Mar-2017 1417    Clinical Impression Statement  Session focused on reviewing balance exercises added to HEP (standing on foam/pillow in a corner with a chair back in front of him to reduce risk of falling). Patient reports he did try these at home, but does not think he remembered to put the chair in front of himself. Reports he would only do the exercise if his wife is home. Remainder of session further workked on balance on compliant  surface (red mat) and pre-gait to gait training emphasizing heel strike to improve foot clearance, step length, arm swing, and head up. Anticipate discharge from PT at next visit.     Rehab Potential  Good    PT Frequency  2x / week    PT Duration  2 weeks per recert 48/2/70    PT Treatment/Interventions  ADLs/Self Care Home Management;Functional mobility training;Gait training;Therapeutic activities;Therapeutic exercise;Balance training;Neuromuscular re-education;Patient/family education    PT Next Visit Plan  check final LTG; 11/8 added corner balance compliant surface exercises to HEP; discuss plan to continue exercises upon discharge    Consulted and Agree with Plan of Care  Patient;Family member/caregiver    Family Member Consulted  daughter       Patient will benefit from skilled therapeutic intervention in order to improve the following deficits and impairments:  Abnormal gait, Decreased balance, Decreased mobility, Decreased strength, Difficulty walking, Postural dysfunction, Impaired tone  Visit Diagnosis: Other symptoms and signs involving the nervous system  Unsteadiness on feet  Other abnormalities of gait and mobility  Abnormal posture   G-Codes - March 13, 2017 1422    Functional Assessment Tool Used (Outpatient Only)  TUG 11.65, TUG c 12.31 sec, MiniBestest score 22/28    Functional Limitation  Mobility: Walking and moving around    Mobility: Walking and Moving Around Current Status [phone])  At least 1 percent but less than 20 percent impaired, limited or restricted    Mobility: Walking and Moving Around Goal Status [phone])  At least 1 percent but less than 20 percent impaired, limited or restricted       Problem List Patient Active Problem List   Diagnosis Date Noted  . PD (Parkinson's disease) ([place]) 12/18/2015  . Tremor of both hands 11/22/2015  . PAD (peripheral artery disease) s/p remote left external iliac artery angioplasty 10/28/2012  . Coronary atherosclerosis  10/28/2012  . Hyperlipidemia 10/28/2012  . HTN (hypertension) 10/28/2012  . DM2 (diabetes mellitus, type 2) ([place]) 10/28/2012  . Carotid occlusion, right 10/28/2012   Physical Therapy Progress Note  Dates of Reporting Period: 12/06/16 to Mar 13, 2017  Objective Reports of Subjective Statement: Reports he can tell a difference when he does his exercises  Objective Measurements: TUG 11.65; TUG cognitive 12.31  Goal Update: NA  Plan: Check last LTG and d/c on next visit  Reason Skilled Services are Required: finalize HEP and check final goal.   Rexanne Mano , PT 2017-03-13, 2:26 PM  [facility] [address] [place], [place], [address] Phone: [phone]   Fax:  [phone]  Name: Rodney Love MRN: [id number] Date of Birth: October 21, 1945

## 2017-02-11 NOTE — Therapy (Signed)
[facility] [address] [place], [place], [address] Phone: [phone]   Fax:  [phone]  Occupational Therapy Treatment  Patient Details  Name: Rodney Love MRN: [id number] Date of Birth: March 04, 1946 Referring Provider: Dr. Wells Guiles Tat    Encounter Date: 02/11/2017  OT End of Session - 02/11/17 1424    Visit Number  12    Number of Visits  17    Date for OT Re-Evaluation  02/14/17    Authorization Type  UHC Medicare, no visit limit/no auth; G-code needed    Authorization - Visit Number  12    Authorization - Number of Visits  20    OT Start Time  7672    OT Stop Time  1445    OT Time Calculation (min)  42 min    Activity Tolerance  Patient tolerated treatment well    Behavior During Therapy  [facility] for tasks assessed/performed       Past Medical History:  Diagnosis Date  . Arthritis   . Constipation    OCCASIONAL  . Coronary artery disease   . Diabetes mellitus without complication ([place])   . Environmental allergies   . Hyperlipidemia   . Hypertension   . Inguinal hernia   . Nocturia   . Peripheral arterial disease [facility])     Past Surgical History:  Procedure Laterality Date  . BACK SURGERY    . CARDIAC CATHETERIZATION  05/29/2006   single vessel CAD w/moderate stenosis of the prox. RCA approx 40%,prox. LAD 20-30%,minor irreg. mid abdominal aorta,left iliac artery disease 50-60%  . CATARACT EXTRACTION Right   . HERNIA REPAIR  [id number]    There were no vitals filed for this visit.  Subjective Assessment - 02/11/17 1423    Subjective   Pt reports no pain today    Pertinent History  Parkinson's disease; PAD, HTN, DM, hyperlipedemia, hx of low back pain    Patient Stated Goals  improve buttoning, donning shoes/socks    Currently in Pain?  No/denies            Treatment: PWR !up, rock and step,  Modified quadraped min-mod v.c Reviewed adapted strategies for ADLS and importance of BIG  movements. Therapist started checking goals. Dynamic step and reach with trunk rotation, min-mod v.c to reinforce bigger movements Arm bike x 6 mins level 1 for conditioning, pt maintained 38- 40 rpm                 OT Short Term Goals - 02/04/17 1503      OT SHORT TERM GOAL #1   Title  Pt will be independent with updated HEP.--check 01/24/17    Status  Achieved      OT SHORT TERM GOAL #2   Title  Pt will improve L hand coordination for ADLs as shown by improving time on 9-hole peg test by at least 5sec.    Status  Achieved      OT SHORT TERM GOAL #3   Title  Pt will be able to don shoes/sock mod I.    Status  Achieved      OT SHORT TERM GOAL #4   Title  Pt will verbalize understanding of appropriate community resources and ways to prevent future complications related to PD.    Status  Achieved        OT Long Term Goals - 02/11/17 1411      OT LONG TERM GOAL #1   Title  Pt  will verbalize understanding of adaptive strategies for ADLs/IADLs to incr ease, incr safety, and decr risk of pain.--check LTGs 02/03/17    Status  Achieved      OT LONG TERM GOAL #2   Title  Pt will improve bilateral functional reaching/coordination as shown by improving score on box and blocks test by at least 3 with RUE and at least 5 with LUE>    Status  On-going      OT LONG TERM GOAL #3   Title  Pt will be able to fasten/unfasten 3 buttons in less than 60sec.    Status  Achieved achieved with pt fastening and unfastening  while wearing shirt, 17.22      OT LONG TERM GOAL #4   Title  Pt will improve balance/functional reaching for ADLs/IADLs as shown by improving standing functional reach to 10 inches or greater bilaterally      OT LONG TERM GOAL #5   Title  Pt will improve L hand coordination for ADLs as shown by improving time on 9-hole peg test by at least 10sec.    Status  Achieved            Plan - 02/11/17 1425    Clinical Impression Statement  Pt demonstrates good  overall progress. anticipate d/c next visit.    Rehab Potential  Good    OT Frequency  2x / week    OT Duration  8 weeks    OT Treatment/Interventions  Self-care/ADL training;Cryotherapy;Therapeutic exercise;DME and/or AE instruction;Therapist, nutritional;Therapeutic activities;Patient/family education;Balance training;Cognitive remediation/compensation;Manual Therapy;Neuromuscular education;Moist Heat;Energy conservation;Passive range of motion;Therapeutic exercises    Plan  d/c next visit    Consulted and Agree with Plan of Care  Patient    Family Member Consulted  daughter       Patient will benefit from skilled therapeutic intervention in order to improve the following deficits and impairments:  Decreased balance, Decreased mobility, Decreased activity tolerance, Decreased coordination, Decreased knowledge of precautions, Decreased safety awareness, Impaired tone, Improper body mechanics, Impaired UE functional use, Decreased range of motion, Decreased knowledge of use of DME  Visit Diagnosis: Other symptoms and signs involving the nervous system  Unsteadiness on feet  Other abnormalities of gait and mobility  Abnormal posture  Other symptoms and signs involving the musculoskeletal system  Other lack of coordination  Stiffness of left shoulder, not elsewhere classified  Stiffness of joint, shoulder region, left    Problem List Patient Active Problem List   Diagnosis Date Noted  . PD (Parkinson's disease) ([place]) 12/18/2015  . Tremor of both hands 11/22/2015  . PAD (peripheral artery disease) s/p remote left external iliac artery angioplasty 10/28/2012  . Coronary atherosclerosis 10/28/2012  . Hyperlipidemia 10/28/2012  . HTN (hypertension) 10/28/2012  . DM2 (diabetes mellitus, type 2) ([place]) 10/28/2012  . Carotid occlusion, right 10/28/2012    Brenisha Tsui 02/11/2017, 2:27 PM  [facility] [address]  [place], [place], [address] Phone: [phone]   Fax:  [phone]  Name: DALLIS CZAJA MRN: [id number] Date of Birth: 1945-08-25

## 2017-02-13 ENCOUNTER — Encounter: Payer: Self-pay | Admitting: Occupational Therapy

## 2017-02-13 ENCOUNTER — Ambulatory Visit: Payer: Medicare Other

## 2017-02-13 ENCOUNTER — Ambulatory Visit: Payer: Medicare Other | Admitting: Physical Therapy

## 2017-02-13 ENCOUNTER — Other Ambulatory Visit: Payer: Self-pay

## 2017-02-13 ENCOUNTER — Ambulatory Visit: Payer: Medicare Other | Admitting: Occupational Therapy

## 2017-02-13 DIAGNOSIS — R293 Abnormal posture: Secondary | ICD-10-CM | POA: Diagnosis not present

## 2017-02-13 DIAGNOSIS — R471 Dysarthria and anarthria: Secondary | ICD-10-CM

## 2017-02-13 DIAGNOSIS — R29818 Other symptoms and signs involving the nervous system: Secondary | ICD-10-CM | POA: Diagnosis not present

## 2017-02-13 DIAGNOSIS — R278 Other lack of coordination: Secondary | ICD-10-CM | POA: Diagnosis not present

## 2017-02-13 DIAGNOSIS — R2689 Other abnormalities of gait and mobility: Secondary | ICD-10-CM | POA: Diagnosis not present

## 2017-02-13 DIAGNOSIS — M25612 Stiffness of left shoulder, not elsewhere classified: Secondary | ICD-10-CM

## 2017-02-13 DIAGNOSIS — R29898 Other symptoms and signs involving the musculoskeletal system: Secondary | ICD-10-CM

## 2017-02-13 DIAGNOSIS — R2681 Unsteadiness on feet: Secondary | ICD-10-CM

## 2017-02-13 NOTE — Therapy (Signed)
[facility] [address] [place], [place], [address] Phone: [phone]   Fax:  [phone]  Occupational Therapy Treatment  Patient Details  Name: Rodney Love MRN: [id number] Date of Birth: [birth year] Referring Provider: Dr. Wells Guiles Tat    Encounter Date: 02/13/2017  OT End of Session - 02/13/17 1506    Visit Number  13    Number of Visits  17    Date for OT Re-Evaluation  02/14/17    Authorization Type  UHC Medicare, no visit limit/no auth; G-code needed    Authorization - Visit Number  13    Authorization - Number of Visits  20    OT Start Time  1453    OT Stop Time  1531    OT Time Calculation (min)  38 min    Activity Tolerance  Patient tolerated treatment well    Behavior During Therapy  [facility] for tasks assessed/performed       Past Medical History:  Diagnosis Date  . Arthritis   . Constipation    OCCASIONAL  . Coronary artery disease   . Diabetes mellitus without complication ([place])   . Environmental allergies   . Hyperlipidemia   . Hypertension   . Inguinal hernia   . Nocturia   . Peripheral arterial disease [facility])     Past Surgical History:  Procedure Laterality Date  . BACK SURGERY    . CARDIAC CATHETERIZATION  05/29/2006   single vessel CAD w/moderate stenosis of the prox. RCA approx 40%,prox. LAD 20-30%,minor irreg. mid abdominal aorta,left iliac artery disease 50-60%  . CATARACT EXTRACTION Right   . HERNIA REPAIR  [id number]  . INGUINAL HERNIA REPAIR N/A 09/23/2012   Procedure: LAPAROSCOPIC INGUINAL HERNIA with umbilical hernia;  Surgeon: Madilyn Hook, DO;  Location: [facility] ORS;  Service: General;  Laterality: N/A;  . INSERTION OF MESH Bilateral 09/23/2012   Procedure: INSERTION OF MESH;  Surgeon: Madilyn Hook, DO;  Location: [facility] ORS;  Service: General;  Laterality: Bilateral;    There were no vitals filed for this visit.  Subjective Assessment - 02/13/17 1506    Pertinent History  Parkinson's  disease; PAD, HTN, DM, hyperlipedemia, hx of low back pain    Limitations  fall risk    Patient Stated Goals  improve buttoning, donning shoes/socks    Currently in Pain?  No/denies        Checked remaining goals and discussed progress and what to continue to work on at home (big reach, open hand to grasp objects, good posture).  Pt verbalized understanding.  Reviewed PWR! Moves in modified quadruped (up, rock, step) and pt returned demo x 10-20 each with min cueing and using handout as guide.  In standing, functional step and reach forward/back at diagonal incorporating PWR! Hands to grasp cylinder object (mod cueing) and wt. Shift, trunk rotation with min cueing for step/coordinate movement.  Picking up coins to stack with each hand, then manipulating in hand to place in coin bank with min cueing.    Recommended screen in approx 6-10month.  Pt verbalized agreement.                 OT Short Term Goals - 02/04/17 1503      OT SHORT TERM GOAL #1   Title  Pt will be independent with updated HEP.--check 01/24/17    Status  Achieved      OT SHORT TERM GOAL #2   Title  Pt will improve L hand coordination for  ADLs as shown by improving time on 9-hole peg test by at least 5sec.    Status  Achieved      OT SHORT TERM GOAL #3   Title  Pt will be able to don shoes/sock mod I.    Status  Achieved      OT SHORT TERM GOAL #4   Title  Pt will verbalize understanding of appropriate community resources and ways to prevent future complications related to PD.    Status  Achieved        OT Long Term Goals - February 19, 2017 1508      OT LONG TERM GOAL #1   Title  Pt will verbalize understanding of adaptive strategies for ADLs/IADLs to incr ease, incr safety, and decr risk of pain.--check LTGs 02/03/17    Status  Achieved      OT LONG TERM GOAL #2   Title  Pt will improve bilateral functional reaching/coordination as shown by improving score on box and blocks test by at least 3 with  RUE and at least 5 with LUE>    Baseline  R-47 blocks, L-41 blocks    Status  Not Met 2017-02-19:  R-47blocks, L-45blocks        OT LONG TERM GOAL #3   Title  Pt will be able to fasten/unfasten 3 buttons in less than 60sec.    Status  Achieved achieved with pt fastening and unfastening  while wearing shirt, 17.22      OT LONG TERM GOAL #4   Title  Pt will improve balance/functional reaching for ADLs/IADLs as shown by improving standing functional reach to 10 inches or greater bilaterally    Baseline  R-9", L-9.5"    Status  Achieved 10" bilaterally, 02/19/17:  R-11', L-9.5 to 10"      OT LONG TERM GOAL #5   Title  Pt will improve L hand coordination for ADLs as shown by improving time on 9-hole peg test by at least 10sec.    Status  Achieved            Plan - 19-Feb-2017 1507    Clinical Impression Statement  Pt has demonstrated good progress overall and is appropriate for OT d/c at this time.    Rehab Potential  Good    OT Frequency  2x / week    OT Duration  8 weeks    OT Treatment/Interventions  Self-care/ADL training;Cryotherapy;Therapeutic exercise;DME and/or AE instruction;Therapist, nutritional;Therapeutic activities;Patient/family education;Balance training;Cognitive remediation/compensation;Manual Therapy;Neuromuscular education;Moist Heat;Energy conservation;Passive range of motion;Therapeutic exercises    Plan  d/c OT; recommend screen in approx 6-64month    Consulted and Agree with Plan of Care  Patient    Family Member Consulted  daughter       Patient will benefit from skilled therapeutic intervention in order to improve the following deficits and impairments:  Decreased balance, Decreased mobility, Decreased activity tolerance, Decreased coordination, Decreased knowledge of precautions, Decreased safety awareness, Impaired tone, Improper body mechanics, Impaired UE functional use, Decreased range of motion, Decreased knowledge of use of DME  Visit  Diagnosis: Other symptoms and signs involving the nervous system  Unsteadiness on feet  Abnormal posture  Other symptoms and signs involving the musculoskeletal system  Other lack of coordination  Stiffness of joint, shoulder region, left  Other abnormalities of gait and mobility  G-Codes - 1November 21, 20181524    Functional Assessment Tool Used (Outpatient only)  fastening/unfastening 3 buttons : 17.22 secs while wearing shirt  Standing functional reach:  RUE 10-11  inches, LUE 9.5-10 inches    Functional Limitation  Self care    Self Care Goal Status (Q8208)  At least 1 percent but less than 20 percent impaired, limited or restricted    Self Care Discharge Status [phone])  At least 1 percent but less than 20 percent impaired, limited or restricted       Problem List Patient Active Problem List   Diagnosis Date Noted  . PD (Parkinson's disease) ([place]) 12/18/2015  . Tremor of both hands 11/22/2015  . PAD (peripheral artery disease) s/p remote left external iliac artery angioplasty 10/28/2012  . Coronary atherosclerosis 10/28/2012  . Hyperlipidemia 10/28/2012  . HTN (hypertension) 10/28/2012  . DM2 (diabetes mellitus, type 2) ([place]) 10/28/2012  . Carotid occlusion, right 10/28/2012   OCCUPATIONAL THERAPY DISCHARGE SUMMARY  Visits from Start of Care: 13  Current functional level related to goals / functional outcomes: See above   Remaining deficits: Bradykinesia, rigidity, decr posture, decr balance for ADLs/IADLs, decr coordination--improved   Education / Equipment: Pt was instructed in the following:  PD-specific HEP, adaptive strategies for ADLs/IADLs, ways to prevent future complications, appropriate community resources.  Pt verbalized understanding of all education provided.    Plan: Patient agrees to discharge.  Patient goals were partially met. Patient is being discharged due to                                                     Reaching maximal rehab potential at this  time.  Pt would benefit from occupational therapy screen in approx 6-9 months to assess for need for further therapy/functional changes due to progressive nature of diagnosis. ?????        [facility] 02/13/2017, 3:25 PM  [facility] [address] [place], [place], [address] Phone: [phone]   Fax:  [phone]  Name: Rodney Love MRN: [id number] Date of Birth: 1945-10-16   Vianne Bulls, OTR/L [facility] [address] [place], [place]  [address] [phone] phone [phone] 02/13/17 3:26 PM

## 2017-02-13 NOTE — Patient Instructions (Signed)
Continue loud "hey - ah!"

## 2017-02-13 NOTE — Therapy (Signed)
[facility] [address] [place], [place], [address] Phone: [phone]   Fax:  [phone]  Speech Language Pathology Treatment  Patient Details  Name: Rodney Love MRN: [id number] Date of Birth: [birth year] Referring Provider: Alonza Bogus, D.O.   Encounter Date: 02/13/2017  End of Session - 02/13/17 1452    Visit Number  8    Number of Visits  17    Date for SLP Re-Evaluation  02/14/17    SLP Start Time  1404    SLP Stop Time   1448    SLP Time Calculation (min)  44 min    Activity Tolerance  Patient tolerated treatment well       Past Medical History:  Diagnosis Date  . Arthritis   . Constipation    OCCASIONAL  . Coronary artery disease   . Diabetes mellitus without complication ([place])   . Environmental allergies   . Hyperlipidemia   . Hypertension   . Inguinal hernia   . Nocturia   . Peripheral arterial disease [facility])     Past Surgical History:  Procedure Laterality Date  . BACK SURGERY    . CARDIAC CATHETERIZATION  05/29/2006   single vessel CAD w/moderate stenosis of the prox. RCA approx 40%,prox. LAD 20-30%,minor irreg. mid abdominal aorta,left iliac artery disease 50-60%  . CATARACT EXTRACTION Right   . HERNIA REPAIR  [id number]  . INGUINAL HERNIA REPAIR N/A 09/23/2012   Procedure: LAPAROSCOPIC INGUINAL HERNIA with umbilical hernia;  Surgeon: Madilyn Hook, DO;  Location: [facility];  Service: General;  Laterality: N/A;  . INSERTION OF MESH Bilateral 09/23/2012   Procedure: INSERTION OF MESH;  Surgeon: Madilyn Hook, DO;  Location: [facility];  Service: General;  Laterality: Bilateral;    There were no vitals filed for this visit.  Subjective Assessment - 02/13/17 1406    Subjective  Pt reports missing one day of loud /a/ in last 8 days.    Patient is accompained by:  Family member daughter    Currently in Pain?  No/denies            ADULT SLP TREATMENT - 02/13/17 1407      General Information    Behavior/Cognition  Alert;Pleasant mood;Cooperative      Treatment Provided   Treatment provided  Cognitive-Linquistic      Cognitive-Linquistic Treatment   Treatment focused on  Dysarthria    Skilled Treatment  SLP used "hey, ahhhhh" for recalibration of speech loudness in conversation - average mid 80s dB.  Pt's average loudness when figuring math problem sgiven verbally was at low end of WNL, at upper 60s-low 70s dB. In conversation with SLP outside [place] room, pt maintained intelligible speech 100% of the time and adequate loudness >95% of the time. Pt reported wife has difficulty hearing him when he is talking to her from another room. Pt problem solved appropriately how to make that situation work - go closer and talk louder -like his "hey- ahhhh" productions. In providing opinions, pt's speech was WNL volume and appropriate response 100% of the time      Assessment / Recommendations / Plan   Plan  -- bring pt back for one visit to ensure consistency       Progression Toward Goals   Progression toward goals  Progressing toward goals         SLP Short Term Goals - 01/31/17 1502      SLP SHORT TERM GOAL #1   Title  Pt  will average 90dB on loud /a/ with rare min A over 5 sessions    Status  Partially Met      SLP SHORT TERM GOAL #2   Title  Pt will average in low 70s dB on 90% of sentence level structured speech tasks with rare min A, over 3 sessions    Status  Achieved      SLP SHORT TERM GOAL #3   Title  Pt will maintain average of 70dB during 8 minute simple conversation with rare min A over three sessions    Status  Not Met      SLP SHORT TERM GOAL #4   Title  pt will demo knowledge of overt s/s dysphagia with meals with modified independence    Time  2    Status  Deferred due to focus on speech        SLP Long Term Goals - 02/13/17 1454      SLP LONG TERM GOAL #1   Title  Pt will maintain average of 70dB during 15 minute moderately complex conversation with rare min  A over two sessions    Baseline  02-13-17    Status  On-going      SLP LONG TERM GOAL #2   Title  Pt. will maintain audible conversation over 12 minutes in noisy environment outside of therapy room over two sessions    Status  Achieved      SLP LONG TERM GOAL #3   Title  pt will tell SLP 3 overt s/s aspiration PNA with modified independence    Time  4    Period  Weeks    Status  On-going       Plan - 02/13/17 1453    Clinical Impression Statement  Pt cont to exhibit WNL loudness today with mod complex phrase and sentence responses without cues from SLP. With thought organization with more complex stimuli, pt dipped briefly with WNL loudness however SLP believes this is functional. Pt agreed one additional visit would be helpful to ensure consistency with WNL loudness. Skilled ST focusing for one more session on speech loudness and breath support in conversation, across speaking situations.    Speech Therapy Frequency  2x / week    Duration  -- 8 weeks    Treatment/Interventions  Compensatory strategies;Patient/family education;Functional tasks;Cueing hierarchy;Compensatory techniques;Internal/external aids;SLP instruction and feedback    Potential to Achieve Goals  Good       Patient will benefit from skilled therapeutic intervention in order to improve the following deficits and impairments:   Dysarthria and anarthria    Problem List Patient Active Problem List   Diagnosis Date Noted  . PD (Parkinson's disease) ([place]) 12/18/2015  . Tremor of both hands 11/22/2015  . PAD (peripheral artery disease) s/p remote left external iliac artery angioplasty 10/28/2012  . Coronary atherosclerosis 10/28/2012  . Hyperlipidemia 10/28/2012  . HTN (hypertension) 10/28/2012  . DM2 (diabetes mellitus, type 2) ([place]) 10/28/2012  . Carotid occlusion, right 10/28/2012    [facility] ,MS, CCC-SLP  02/13/2017, 2:55 PM  [facility] [address] [place], [place], [address] Phone: [phone]   Fax:  [phone]   Name: Rodney Love MRN: [id number] Date of Birth: December 18, 1945

## 2017-03-04 ENCOUNTER — Ambulatory Visit: Payer: Medicare Other | Attending: Internal Medicine

## 2017-03-04 ENCOUNTER — Encounter: Payer: Self-pay | Admitting: Physical Therapy

## 2017-03-04 ENCOUNTER — Other Ambulatory Visit: Payer: Self-pay

## 2017-03-04 ENCOUNTER — Ambulatory Visit: Payer: Medicare Other | Admitting: Physical Therapy

## 2017-03-04 DIAGNOSIS — R2681 Unsteadiness on feet: Secondary | ICD-10-CM | POA: Diagnosis not present

## 2017-03-04 DIAGNOSIS — R29818 Other symptoms and signs involving the nervous system: Secondary | ICD-10-CM | POA: Diagnosis not present

## 2017-03-04 DIAGNOSIS — R471 Dysarthria and anarthria: Secondary | ICD-10-CM | POA: Diagnosis not present

## 2017-03-04 NOTE — Therapy (Signed)
[facility] [address] [place], [place], [address] Phone: [phone]   Fax:  [phone]  Speech Language Pathology Treatment  Patient Details  Name: Rodney Love MRN: [id number] Date of Birth: 03/03/1946 Referring Provider: Alonza Bogus, D.O.   Encounter Date: 03/04/2017  End of Session - 03/04/17 1036    Visit Number  9    Number of Visits  17    Date for SLP Re-Evaluation  02/14/17    SLP Start Time  1022    SLP Stop Time   1102    SLP Time Calculation (min)  40 min    Activity Tolerance  Patient tolerated treatment well       Past Medical History:  Diagnosis Date  . Arthritis   . Constipation    OCCASIONAL  . Coronary artery disease   . Diabetes mellitus without complication ([place])   . Environmental allergies   . Hyperlipidemia   . Hypertension   . Inguinal hernia   . Nocturia   . Peripheral arterial disease [facility])     Past Surgical History:  Procedure Laterality Date  . BACK SURGERY    . CARDIAC CATHETERIZATION  05/29/2006   single vessel CAD w/moderate stenosis of the prox. RCA approx 40%,prox. LAD 20-30%,minor irreg. mid abdominal aorta,left iliac artery disease 50-60%  . CATARACT EXTRACTION Right   . HERNIA REPAIR  [id number]  . INGUINAL HERNIA REPAIR N/A 09/23/2012   Procedure: LAPAROSCOPIC INGUINAL HERNIA with umbilical hernia;  Surgeon: Madilyn Hook, DO;  Location: [facility];  Service: General;  Laterality: N/A;  . INSERTION OF MESH Bilateral 09/23/2012   Procedure: INSERTION OF MESH;  Surgeon: Madilyn Hook, DO;  Location: [facility];  Service: General;  Laterality: Bilateral;    There were no vitals filed for this visit.  Subjective Assessment - 03/04/17 1026    Subjective  Pt's wife reports pt is "rarely" louder at home, and he estimates approx 12 days of loud /a/.     Patient is accompained by:  -- wife    Currently in Pain?  No/denies            ADULT SLP TREATMENT - 03/04/17 1030      General Information   Behavior/Cognition  Alert;Pleasant mood;Cooperative      Treatment Provided   Treatment provided  Cognitive-Linquistic      Cognitive-Linquistic Treatment   Treatment focused on  Dysarthria    Skilled Treatment  SLP used "hey, ahhhhh" for recalibration of speech loudness in conversation - average mid-upper 80s dB.  Pt req'd min A occaisonally for louder productions and abdominal strength instead of laryngeal push. To facilitate audible conversation between pt and wife, pt described pictures for wife to guess and wife did so with 100% success, pt maintained WNL volume in this task 100% of the time. Pt provided 4 overt s/s aspiration PNA with modified independence. Pt spoke with his wife outside [place] room and pt maintained low 70s dB in 8 minutes of conversation with wife. Wife/pt stated conversation would last this long at home. SLP encouraged pt to use good intelligilbity strategies - sit across from listener, be in same room and have eye contact with listener, turn down or eliminate sound distractions in the room of conversation. Pt repeated these back to SLP after 7 minutes with rare nonverbal cues.      Assessment / Recommendations / Plan   Plan  Discharge SLP treatment due to (comment) met goals  Progression Toward Goals   Progression toward goals  Goals met, education completed, patient discharged from [facility] Education - 2017/03/30 1035    Education provided  Yes    Education Details  overt s/s aspiration PNA, communication tips    Person(s) Educated  Patient;Spouse    Methods  Explanation;Handout    Comprehension  Verbalized understanding       SLP Short Term Goals - 01/31/17 1502      SLP SHORT TERM GOAL #1   Title  Pt will average 90dB on loud /a/ with rare min A over 5 sessions    Status  Partially Met      SLP SHORT TERM GOAL #2   Title  Pt will average in low 70s dB on 90% of sentence level structured speech tasks with rare min A, over 3  sessions    Status  Achieved      SLP SHORT TERM GOAL #3   Title  Pt will maintain average of 70dB during 8 minute simple conversation with rare min A over three sessions    Status  Not Met      SLP SHORT TERM GOAL #4   Title  pt will demo knowledge of overt s/s dysphagia with meals with modified independence    Time  2    Status  Deferred due to focus on speech        SLP Long Term Goals - 30-Mar-2017 [place] #1   Title  Pt will maintain average of 70dB during 15 minute moderately complex conversation with rare min A over two sessions    Status  Partially Met one session      SLP LONG TERM GOAL #2   Title  Pt. will maintain audible conversation over 12 minutes in noisy environment outside of therapy room over two sessions    Status  Partially Met one session      SLP LONG TERM GOAL #3   Title  pt will tell SLP 3 overt s/s aspiration PNA with modified independence    Status  Achieved       Plan - 2017/03/30 1649    Clinical Impression Statement  Pt cont to exhibit WNL loudness today with wife in and outside of [facility] room for 8 minutes of conversation. Pt demo'd understanding of communication tips and also overt s/s aspiration PNA.  SLP witnessed pt using WFL/WNL breath support for WNL speech loudness while in session today. He agrees that d/c is appropriate at this time.     Treatment/Interventions  Compensatory strategies;Patient/family education;Functional tasks;Cueing hierarchy;Compensatory techniques;Internal/external aids;SLP instruction and feedback    Potential to Achieve Goals  Good       Patient will benefit from skilled therapeutic intervention in order to improve the following deficits and impairments:   Dysarthria and anarthria  G-Codes - Mar 30, 2017 1653    Functional Assessment Tool Used  noms, clinical judgment    Functional Limitations  Motor speech    Motor Speech Goal Status [phone])  At least 1 percent but less than 20 percent impaired, limited  or restricted    Motor Speech Goal Status ([id number])  At least 1 percent but less than 20 percent impaired, limited or restricted       Problem List Patient Active Problem List   Diagnosis Date Noted  . PD (Parkinson's disease) ([place]) 12/18/2015  . Tremor of both hands 11/22/2015  . PAD (peripheral  artery disease) s/p remote left external iliac artery angioplasty 10/28/2012  . Coronary atherosclerosis 10/28/2012  . Hyperlipidemia 10/28/2012  . HTN (hypertension) 10/28/2012  . DM2 (diabetes mellitus, type 2) ([place]) 10/28/2012  . Carotid occlusion, right 10/28/2012    [facility] ,MS, CCC-SLP  03/04/2017, 4:54 PM  [facility] [address] [place], [place], [address] Phone: [phone]   Fax:  [phone]   Name: Rodney Love MRN: [id number] Date of Birth: 09/10/1945

## 2017-03-04 NOTE — Patient Instructions (Addendum)
Signs of Aspiration Pneumonia   . Chest pain/tightness . Fever (can be low grade) . Cough  o With foul-smelling phlegm (sputum) o With sputum containing pus or blood o With greenish sputum . Fatigue  . Shortness of breath  . Wheezing   **IF YOU HAVE THESE SIGNS, CONTACT YOUR DOCTOR OR GO TO THE EMERGENCY DEPARTMENT OR URGENT CARE AS SOON AS POSSIBLE**    ============================================== To improve communication: Move to the room where your wife is, or have her come to you. Speak to her face - have her eye contact. Turn down competing noise: TV, radio - or sit as far away from noise as you can in a noisy place.

## 2017-03-04 NOTE — Therapy (Signed)
[facility] [address] [place], [place], [address] Phone: [phone]   Fax:  [phone]  Physical Therapy Treatment  Patient Details  Name: Rodney Love MRN: [id number] Date of Birth: [birth year] Referring Provider: Alonza Bogus   Encounter Date: 03/04/2017  PT End of Session - 03/04/17 1426    Visit Number  11    Number of Visits  12    Date for PT Re-Evaluation  03/05/17    Authorization Type  [employer] Medicare-GCODE every 10th visit    PT Start Time  1149    PT Stop Time  1229    PT Time Calculation (min)  40 min    Activity Tolerance  Patient tolerated treatment well    Behavior During Therapy  [facility] for tasks assessed/performed       Past Medical History:  Diagnosis Date  . Arthritis   . Constipation    OCCASIONAL  . Coronary artery disease   . Diabetes mellitus without complication ([place])   . Environmental allergies   . Hyperlipidemia   . Hypertension   . Inguinal hernia   . Nocturia   . Peripheral arterial disease [facility])     Past Surgical History:  Procedure Laterality Date  . BACK SURGERY    . CARDIAC CATHETERIZATION  05/29/2006   single vessel CAD w/moderate stenosis of the prox. RCA approx 40%,prox. LAD 20-30%,minor irreg. mid abdominal aorta,left iliac artery disease 50-60%  . CATARACT EXTRACTION Right   . HERNIA REPAIR  [id number]  . INGUINAL HERNIA REPAIR N/A 09/23/2012   Procedure: LAPAROSCOPIC INGUINAL HERNIA with umbilical hernia;  Surgeon: Madilyn Hook, DO;  [facility];  Service: General;  Laterality: N/A;  . INSERTION OF MESH Bilateral 09/23/2012   Procedure: INSERTION OF MESH;  Surgeon: Madilyn Hook, DO;  [facility];  Service: General;  Laterality: Bilateral;    There were no vitals filed for this visit.  Subjective Assessment - 03/04/17 1151    Subjective  No falls, but I still can't stand on one foot.  Try to do several exercises per day.    Patient is accompained by:   Family member    Pertinent History  ruptured disk in back (years ago per pt report), OA, DM, CAD, HTN, PAD    Patient Stated Goals  Pt's goals are to be able to walk where I want to, when I want to.    Currently in Pain?  No/denies                      [facility] Adult PT Treatment/Exercise - 03/04/17 0001      Transfers   Sit to Stand  6: Modified independent (Device/Increase time);Without upper extremity assist;From chair/3-in-1    Five time sit to stand comments   11.12    Stand to Sit  6: Modified independent (Device/Increase time);Without upper extremity assist;To chair/3-in-1;To bed      Ambulation/Gait   Ambulation/Gait  Yes    Ambulation/Gait Assistance  5: Supervision;6: Modified independent (Device/Increase time)    Ambulation Distance (Feet)  230 Feet x 2    Assistive device  None    Gait Pattern  Step-through pattern;Decreased arm swing - left;Decreased trunk rotation    Ambulation Surface  Level;Indoor    Gait velocity  9.75 sec = 3.36 ft/sec    Stairs  Yes    Stair Management Technique  One rail Right;Alternating pattern    Number of Stairs  4 x 2  Height of Stairs  6    Ramp  7: Independent    Curb  7: Independent      Timed Up and Go Test   TUG  Normal TUG    Normal TUG (seconds)  11.31      High Level Balance   High Level Balance Comments  Reviewed corner balance exercises:  standing feet semi-tandem on pillow, with head turns/head nods x 5 reps, then EC x 10 seconds.  Pt has decreased head turn to L; performed 2nd set with visual targets and with light handheld support at [facility], with improved ability for head turns.  At parallel bars:  SLS activity, 10 seconds, 3 reps each leg with 1 UE support.  Attempted without UE support and pt unable to perform.  Heel/toe raises x 20 reps, then single leg heel raises x 10 reps each leg.      Self-Care   Self-Care  Other Self-Care Comments    Other Self-Care Comments   Discussed progress towards goals, updates to  HEP and patient able to describe how he is using exercise chart to track exercises and perform different 3-4 sets each day.  Discussed continuing fitness including HEP, walking as exercise and aerobic activity (seated pedaler or stepper at home).  Discussed plans for return screen in 6-9 months.  Pt is agreeable.       Reviewed postural re-education exercise standing at wall:  Chin tucks x 10 reps       PT Education - 03/04/17 1424    Education provided  Yes    Education Details  Added to HEP-SLS and heel/toe raises (pt ia already performing at home, just does not have pictures; continued fitness, d/c plans and return screen in 6 months.    Person(s) Educated  Patient;Spouse    Methods  Explanation;Demonstration;Handout    Comprehension  Verbalized understanding;Returned demonstration       PT Short Term Goals - 01/14/17 1729      PT SHORT TERM GOAL #1   Title  Pt will be independent with HEP to address Parkinson's disease-related deficits.    Baseline  01/14/17-pt returned after missing 2+weeks due to cataract surgery. Reports he is "just getting back into doing" HEP    Time  4    Period  Weeks    Status  Not Met      PT SHORT TERM GOAL #2   Title  Pt will perform at least 8 of 10 reps of sit<>stand from lower than 18 inch surfaces, minimal to no UE support, independently, for improved ease of transfers.    Time  4    Period  Weeks    Status  Achieved      PT SHORT TERM GOAL #3   Title  Pt will improveTUG cognitive score to less than or equal to 15 seconds for decreased fall risk.    Baseline  10/16  14.04 sec    Time  4    Period  Weeks    Status  Achieved      PT SHORT TERM GOAL #4   Title  Pt will improve SLS to at least 3 seconds each leg, for improved obstacle and step negotiation.    Baseline  10/16 RLE 4.13 sec, LLE 4.17 sec    Time  4    Period  Weeks    Status  Achieved        PT Long Term Goals - 03/04/17 1205  PT LONG TERM GOAL #1   Title  Pt will  verbalize understanding of fall prevention in home environment.      Time  6    Period  Weeks    Status  Achieved      PT LONG TERM GOAL #2   Title  Pt will improve TUG and TUG cog score to within <10% difference, for improved dual tasking, decreased fall risk.    Time  6    Period  Weeks    Status  Achieved      PT LONG TERM GOAL #3   Title  Pt will improve MiniBESTest score to at least 23/28 for decreased fall risk.    Baseline  Score 22/28    Time  6    Period  Weeks    Status  Not Met      PT LONG TERM GOAL #4   Title  Pt will verbalize plans for continued community fitness upon d/c from PT.    Time  6 2 additional weeks    Period  Weeks    Status  Achieved      PT LONG TERM GOAL #5   Title  Pt will be independent with updates to HEP for compliant surface balance exercises.    Time  2    Period  Weeks    Status  Achieved            Plan - 03/23/17 1427    Clinical Impression Statement  Pt's previous LTGs 1-4 have been checked.  Remaining LTGs 5 and 6 assessed today, with pt meeting both goals.  Pt able to verbalize understanding of continued fitness including use of exercise to regularly perform all exercises in HEP and demo understanding of HEP.  Pt continues to be active around home and yard and has improved gait velocity and TUG scores, indicating improved functional moiblity and balance during the course of therapy.  Pt is appropriate for d/c at this time.    Rehab Potential  Good    PT Frequency  2x / week    PT Duration  2 weeks per recert 04/08/47    PT Treatment/Interventions  ADLs/Self Care Home Management;Functional mobility training;Gait training;Therapeutic activities;Therapeutic exercise;Balance training;Neuromuscular re-education;Patient/family education    PT Next Visit Plan  Discharge this visit.  Plan for return screen in 6-9 months.    Consulted and Agree with Plan of Care  Patient;Family member/caregiver    Family Member Consulted  wife        Patient will benefit from skilled therapeutic intervention in order to improve the following deficits and impairments:  Abnormal gait, Decreased balance, Decreased mobility, Decreased strength, Difficulty walking, Postural dysfunction, Impaired tone  Visit Diagnosis: Other symptoms and signs involving the nervous system  Unsteadiness on feet   G-Codes - 03/23/17 1430    Functional Assessment Tool Used (Outpatient Only)  TUG 11.31, 5x sit<>stand 11.12 sec; gait velocity 3.36 ft//sec    Functional Limitation  Mobility: Walking and moving around    Mobility: Walking and Moving Around Goal Status [phone])  At least 1 percent but less than 20 percent impaired, limited or restricted    Mobility: Walking and Moving Around Discharge Status [phone])  At least 1 percent but less than 20 percent impaired, limited or restricted       Problem List Patient Active Problem List   Diagnosis Date Noted  . PD (Parkinson's disease) ([place]) 12/18/2015  . Tremor of both hands 11/22/2015  . PAD (  peripheral artery disease) s/p remote left external iliac artery angioplasty 10/28/2012  . Coronary atherosclerosis 10/28/2012  . Hyperlipidemia 10/28/2012  . HTN (hypertension) 10/28/2012  . DM2 (diabetes mellitus, type 2) ([place]) 10/28/2012  . Carotid occlusion, right 10/28/2012    Rodney Ramsay W. 03/04/2017, 2:32 PM  Frazier Butt., PT   [facility] [address] [place], [place], [address] Phone: [phone]   Fax:  [phone]  Name: Rodney Love MRN: [id number] Date of Birth: 1945-10-31   PHYSICAL THERAPY DISCHARGE SUMMARY  Visits from Start of Care: 11  Current functional level related to goals / functional outcomes: PT Long Term Goals - 03/04/17 1205      PT LONG TERM GOAL #1   Title  Pt will verbalize understanding of fall prevention in home environment.      Time  6    Period  Weeks    Status  Achieved      PT LONG TERM  GOAL #2   Title  Pt will improve TUG and TUG cog score to within <10% difference, for improved dual tasking, decreased fall risk.    Time  6    Period  Weeks    Status  Achieved      PT LONG TERM GOAL #3   Title  Pt will improve MiniBESTest score to at least 23/28 for decreased fall risk.    Baseline  Score 22/28    Time  6    Period  Weeks    Status  Not Met      PT LONG TERM GOAL #4   Title  Pt will verbalize plans for continued community fitness upon d/c from PT.    Time  6 2 additional weeks    Period  Weeks    Status  Achieved      PT LONG TERM GOAL #5   Title  Pt will be independent with updates to HEP for compliant surface balance exercises.    Time  2    Period  Weeks    Status  Achieved      Pt has met 5 of 6 long term goals as noted above.   Remaining deficits: Posture, gait, bradykinesia   Education / Equipment: Educated in [employer], fall prevention, continued community fitness.  Plan: Patient agrees to discharge.  Patient goals were met. Patient is being discharged due to meeting the stated rehab goals.  ?????Recommend PT screen in 6-9 months due to progressive nature of disease process.  Mady Haagensen, PT 03/04/17 2:35 PM Phone: [phone] Fax: [phone]

## 2017-03-04 NOTE — Patient Instructions (Addendum)
SINGLE LIMB STANCE    Hold onto the counter with one hand.  Stance: single leg on floor. Raise leg. Hold _10__ seconds. Repeat with other leg. _3__ reps per set, _1-2__ sets per day   Copyright  VHI. All rights reserved.  Toe / Heel Raise    Gently rock back on heels and raise toes. Then rock forward on toes and raise heels. Repeat sequence __20__ times per session. Do __1-2__ sessions per day.  Copyright  VHI. All rights reserved.

## 2017-03-17 ENCOUNTER — Ambulatory Visit: Payer: Medicare Other | Admitting: Neurology

## 2017-03-26 DIAGNOSIS — Z0000 Encounter for general adult medical examination without abnormal findings: Secondary | ICD-10-CM | POA: Diagnosis not present

## 2017-03-26 DIAGNOSIS — Z125 Encounter for screening for malignant neoplasm of prostate: Secondary | ICD-10-CM | POA: Diagnosis not present

## 2017-03-26 DIAGNOSIS — E118 Type 2 diabetes mellitus with unspecified complications: Secondary | ICD-10-CM | POA: Diagnosis not present

## 2017-03-26 DIAGNOSIS — E785 Hyperlipidemia, unspecified: Secondary | ICD-10-CM | POA: Diagnosis not present

## 2017-03-31 DIAGNOSIS — I2510 Atherosclerotic heart disease of native coronary artery without angina pectoris: Secondary | ICD-10-CM | POA: Diagnosis not present

## 2017-03-31 DIAGNOSIS — M545 Low back pain: Secondary | ICD-10-CM | POA: Diagnosis not present

## 2017-03-31 DIAGNOSIS — Z0001 Encounter for general adult medical examination with abnormal findings: Secondary | ICD-10-CM | POA: Diagnosis not present

## 2017-03-31 DIAGNOSIS — Z1212 Encounter for screening for malignant neoplasm of rectum: Secondary | ICD-10-CM | POA: Diagnosis not present

## 2017-04-02 DIAGNOSIS — Z23 Encounter for immunization: Secondary | ICD-10-CM | POA: Diagnosis not present

## 2017-04-10 NOTE — Progress Notes (Signed)
Rodney Love was seen today in the movement disorders clinic for neurologic consultation at the request of [name], MD.  The consultation is for the evaluation of tremor.  The records that were made available to me were reviewed. This patient is accompanied in the office by his daughter who supplements the history.  The records that were made available to me were reviewed.  Cardiology notes do indicate resting tremor.  Cardiology thought that it was likely ET and propranolol was started on 11/22/15.  He was given 10 mg bid but states that he couldn't handle that due to lightheaded/dizzy and went to 5 mg bid.  He thinks that it helps some.   02/13/16 update:  Pt was seen in f/u, accompanied by his daughter who supplements the history.  Started on carbidopa/levodopa 25/100 tid last visit after being seen and dx with PD.  If he doesn't take it on time, he will get "jittery."  His propranolol was d/c by his cardiologist.  He had an MRI brain of the brain on 12/25/15 demonstrating scattered, mild-mod T2 hyperintensities, slighly more at grey-white jxn. It also demonstrated a known, right carotid occlusion, which cardiology has been following with carotid u/s, last done in March, 2017.  He is still attending PD therapies at the neurorehab center and those notes were reviewed.  06/13/16 update:  Patient follows up today, accompanied by his daughter who supplements the history.  Patient remains on carbidopa/levodopa 25/100, one tablet 3 times per day (6:30am/11:30am/5:30pm).  Overall, he thinks he has been stable.  He finished rehabilitation therapies at the end of December.  Pt denies falls.  Pt denies lightheadedness, near syncope.  No hallucinations.  Mood has been stable.  States that he has been trying to walk daily for exercise.  11/14/16 update:  Patient seen today in follow-up for his Parkinson's disease, accompanied by his daughter who supplements the history.  Patient is on carbidopa/levodopa  25/100, one tablet 3 times per day (6:30am/11:30am/5:30pm).  He notes that at 3-4am, he will awaken tremulous.    Pt denies falls.  Pt denies lightheadedness, near syncope.  No hallucinations.  Mood has been good.  Seen at rehab and order requested at end July for ST/OT/PT  04/11/16 update: Patient seen today in follow-up for Parkinson's disease.  He is accompanied by his daughter who supplements the history.  Patient is on carbidopa/levodopa 25/100, 1 tablet 3 times per day.  Last visit, we added carbidopa/levodopa 50/200 at bedtime.  This was primarily because he was waking up at night tremulous.  He tells me today that this helped.  He has attended therapy since our last visit and I reviewed those records.  He is exercising minimally - "just walks and paces" per his daughter.   Pt denies falls.  Pt denies lightheadedness, near syncope.  No hallucinations.  Mood has been good.   ALLERGIES:  No Known Allergies  CURRENT MEDICATIONS:  Outpatient Encounter Medications as of 04/14/2017  Medication Sig  . aspirin EC 81 MG tablet Take 81 mg by mouth daily.  Marland Kitchen atorvastatin (LIPITOR) 20 MG tablet Take 0.5 tablets by mouth daily.  . carbidopa-levodopa (SINEMET CR) 50-200 MG tablet Take 1 tablet by mouth at bedtime.  . carbidopa-levodopa (SINEMET IR) 25-100 MG tablet TAKE ONE TABLET BY MOUTH THREE TIMES A DAY  . tamsulosin (FLOMAX) 0.4 MG CAPS capsule Take 1 capsule by mouth daily.  . [DISCONTINUED] cholecalciferol (VITAMIN D) 1000 units tablet Take 1,000 Units by mouth once  a week.    No facility-administered encounter medications on file as of 04/14/2017.     PAST MEDICAL HISTORY:   Past Medical History:  Diagnosis Date  . Arthritis   . Constipation    OCCASIONAL  . Coronary artery disease   . Diabetes mellitus without complication ([place])   . Environmental allergies   . Hyperlipidemia   . Hypertension   . Inguinal hernia   . Nocturia   . Peripheral arterial disease ([place])     PAST SURGICAL  HISTORY:   Past Surgical History:  Procedure Laterality Date  . BACK SURGERY    . CARDIAC CATHETERIZATION  05/29/2006   single vessel CAD w/moderate stenosis of the prox. RCA approx 40%,prox. LAD 20-30%,minor irreg. mid abdominal aorta,left iliac artery disease 50-60%  . CATARACT EXTRACTION Left 12/2016  . HERNIA REPAIR  [id number]  . INGUINAL HERNIA REPAIR N/A 09/23/2012   Procedure: LAPAROSCOPIC INGUINAL HERNIA with umbilical hernia;  Surgeon: Madilyn Hook, DO;  Location: [facility];  Service: General;  Laterality: N/A;  . INSERTION OF MESH Bilateral 09/23/2012   Procedure: INSERTION OF MESH;  Surgeon: Madilyn Hook, DO;  Location: [facility];  Service: General;  Laterality: Bilateral;    SOCIAL HISTORY:   Social History   Socioeconomic History  . Marital status: Married    Spouse name: Not on file  . Number of children: Not on file  . Years of education: Not on file  . Highest education level: Not on file  Social Needs  . Financial resource strain: Not on file  . Food insecurity - worry: Not on file  . Food insecurity - inability: Not on file  . Transportation needs - medical: Not on file  . Transportation needs - non-medical: Not on file  Occupational History  . Occupation: retired    Comment: Architect  Tobacco Use  . Smoking status: Former Smoker    Last attempt to quit: 04/02/1991    Years since quitting: 26.0  . Smokeless tobacco: Former Systems developer    Quit date: 08/06/1991  Substance and Sexual Activity  . Alcohol use: No  . Drug use: No  . Sexual activity: Not on file  Other Topics Concern  . Not on file  Social History Narrative  . Not on file    FAMILY HISTORY:   Family Status  Relation Name Status  . Mother  Deceased  . Father  Deceased  . Brother  Deceased  . Sister  Deceased  . Sister  Deceased  . Sister  Alive  . Daughter  Alive  . Daughter  Alive  . Son  Alive    ROS:  A complete 10 system review of systems was obtained and was unremarkable apart from what is  mentioned above.  PHYSICAL EXAMINATION:    VITALS:   Vitals:   04/14/17 0909  BP: 110/62  Pulse: 84  SpO2: 98%  Weight: 201 lb (91.2 kg)  Height: 6' (1.829 m)    GEN:  The patient appears stated age and is in NAD. HEENT:  Normocephalic, atraumatic.  The mucous membranes are moist. The superficial temporal arteries are without ropiness or tenderness. CV:  RRR Lungs:  CTAB Neck/HEME:  There are no carotid bruits bilaterally.  Neurological examination:  Orientation: The patient is alert and oriented x3.  Cranial nerves: There is good facial symmetry.   There is significant facial hypomima with decreased blink.   Extraocular muscles are intact. The visual fields are full to confrontational testing. The speech is  fluent and clear. Soft palate rises symmetrically and there is no tongue deviation. Hearing is intact to conversational tone. Sensation: Sensation is intact to light touch throughout Motor: Strength is 5/5 in the bilateral upper and lower extremities.   Shoulder shrug is equal and symmetric.  There is no pronator drift.   Movement examination: Tone: There is normal tone bilaterally Abnormal movements: There is a rare LUE resting tremor Coordination:  There is slow heel taps and toe taps on the L.  Other RAMs are good.   Gait and Station: The patient has no difficulty arising out of a deep-seated chair without the use of the hands. The patient's stride length is good today and he has no trouble with turning.  Labs:   Lab work was received from his primary care physician dated March 26, 2017.  White blood cells were 5.2, hemoglobin 14.4, hematocrit 44.1 and platelets 164.  Sodium was 141, potassium 4.2, chloride 104, CO2 22, BUN 11, creatinine 0.99, AST 28, ALT 16, hemoglobin A1c 6.4 (6.0 in June, 2018)  ASSESSMENT/PLAN:  1.  Idiopathic Parkinson's disease.  The patient has tremor, bradykinesia, rigidity and mild postural instability.  -continue carbidopa/levodopa 25/100  tid  -continue carbidopa/levodopa 50/200 cr q hs.    -encouraged patient to exercise  2.  Hyperreflexia  -MRI brain unremarkable except for small vessel disease and known R carotid occlusion.  No neck pain  3.  R carotid occlusion  -nothing further to be done in that regard  4.  Follow up is anticipated in the next few months, sooner should new neurologic issues arise.  Much greater than 50% of this visit was spent in counseling and coordinating care.  Total face to face time:  20 min       Cc:  [name], MD

## 2017-04-14 ENCOUNTER — Encounter: Payer: Self-pay | Admitting: Neurology

## 2017-04-14 ENCOUNTER — Ambulatory Visit: Payer: Medicare Other | Admitting: Neurology

## 2017-04-14 VITALS — BP 110/62 | HR 84 | Ht 72.0 in | Wt 201.0 lb

## 2017-04-14 DIAGNOSIS — G20 Parkinson's disease: Secondary | ICD-10-CM | POA: Diagnosis not present

## 2017-04-14 DIAGNOSIS — G20A1 Parkinson's disease without dyskinesia, without mention of fluctuations: Secondary | ICD-10-CM

## 2017-04-18 DIAGNOSIS — M722 Plantar fascial fibromatosis: Secondary | ICD-10-CM | POA: Diagnosis not present

## 2017-05-02 ENCOUNTER — Other Ambulatory Visit: Payer: Self-pay | Admitting: Neurology

## 2017-05-14 DIAGNOSIS — S93401A Sprain of unspecified ligament of right ankle, initial encounter: Secondary | ICD-10-CM | POA: Diagnosis not present

## 2017-05-14 DIAGNOSIS — M25571 Pain in right ankle and joints of right foot: Secondary | ICD-10-CM | POA: Diagnosis not present

## 2017-05-14 DIAGNOSIS — E1351 Other specified diabetes mellitus with diabetic peripheral angiopathy without gangrene: Secondary | ICD-10-CM | POA: Diagnosis not present

## 2017-05-14 DIAGNOSIS — R609 Edema, unspecified: Secondary | ICD-10-CM | POA: Diagnosis not present

## 2017-05-21 DIAGNOSIS — M25571 Pain in right ankle and joints of right foot: Secondary | ICD-10-CM | POA: Diagnosis not present

## 2017-06-10 ENCOUNTER — Telehealth: Payer: Self-pay | Admitting: Cardiovascular Disease

## 2017-06-10 DIAGNOSIS — I739 Peripheral vascular disease, unspecified: Secondary | ICD-10-CM

## 2017-06-10 NOTE — Telephone Encounter (Signed)
New Message:    Please call,pt says he have a heaviness in his legs and had some cramping in his legs last night.This morning he can hardly walk,feels like it did years ago when he had a blood clot.

## 2017-06-10 NOTE — Telephone Encounter (Addendum)
Per daughter, patient c/o of a heaviness in his lower left leg with severe cramping last night. No c/o redness, swelling or warmth to extremities. Per daughter, patient had difficulty walking in [employer] this morning but now is walking okay. Per daughter, patient had a recent right ankle sprain and has not been mobile. No c/o chest pain,sob or dizziness. Daughter prefer patient is seen at our office and said that in the past, patient had similar symptoms and was found to have blockages in his leg. Former smoker and is diagnosed with PAD. Daughter advised that patient was pass due for follow up and first available appointment was given for June 2019 with [facility]. After review of chart, patient has seen Gwenlyn Found in the past. Message will be sent to Dr. Gwenlyn Found for advice.  Daughter would like to be reached on her cell phone if after 1:00 pm [phone].

## 2017-06-10 NOTE — Telephone Encounter (Signed)
Repeat lower extremity arterial Doppler studies and have him see me back in the next 4-6 weeks.

## 2017-06-11 NOTE — Telephone Encounter (Signed)
F/U call:  Toya Smothers returning call from yesterday.

## 2017-06-12 NOTE — Telephone Encounter (Signed)
Pt daughter aware.  LEA scheduled for 4/22 @ 12:30pm and F/u appt with Dr. Gwenlyn Found on 4/5@ 2:45pm.

## 2017-06-13 ENCOUNTER — Other Ambulatory Visit: Payer: Self-pay | Admitting: Cardiovascular Disease

## 2017-06-13 DIAGNOSIS — I739 Peripheral vascular disease, unspecified: Secondary | ICD-10-CM

## 2017-06-18 DIAGNOSIS — M25571 Pain in right ankle and joints of right foot: Secondary | ICD-10-CM | POA: Diagnosis not present

## 2017-06-20 ENCOUNTER — Ambulatory Visit (HOSPITAL_COMMUNITY)
Admission: RE | Admit: 2017-06-20 | Discharge: 2017-06-20 | Disposition: A | Discharge status: Home / Self Care | Payer: Medicare Other | Source: Ambulatory Visit | Attending: Cardiovascular Disease | Admitting: Cardiovascular Disease

## 2017-06-20 ENCOUNTER — Encounter (HOSPITAL_COMMUNITY): Payer: Medicare Other

## 2017-06-20 DIAGNOSIS — I10 Essential (primary) hypertension: Secondary | ICD-10-CM | POA: Diagnosis not present

## 2017-06-20 DIAGNOSIS — F17200 Nicotine dependence, unspecified, uncomplicated: Secondary | ICD-10-CM | POA: Insufficient documentation

## 2017-06-20 DIAGNOSIS — E119 Type 2 diabetes mellitus without complications: Secondary | ICD-10-CM | POA: Insufficient documentation

## 2017-06-20 DIAGNOSIS — I2510 Atherosclerotic heart disease of native coronary artery without angina pectoris: Secondary | ICD-10-CM | POA: Insufficient documentation

## 2017-06-20 DIAGNOSIS — I739 Peripheral vascular disease, unspecified: Secondary | ICD-10-CM | POA: Diagnosis not present

## 2017-06-20 DIAGNOSIS — E785 Hyperlipidemia, unspecified: Secondary | ICD-10-CM | POA: Insufficient documentation

## 2017-06-20 DIAGNOSIS — I70213 Atherosclerosis of native arteries of extremities with intermittent claudication, bilateral legs: Secondary | ICD-10-CM | POA: Diagnosis not present

## 2017-06-24 ENCOUNTER — Telehealth: Payer: Self-pay | Admitting: Neurology

## 2017-06-24 DIAGNOSIS — I2510 Atherosclerotic heart disease of native coronary artery without angina pectoris: Secondary | ICD-10-CM | POA: Diagnosis not present

## 2017-06-24 DIAGNOSIS — Z87891 Personal history of nicotine dependence: Secondary | ICD-10-CM | POA: Diagnosis not present

## 2017-06-24 DIAGNOSIS — E785 Hyperlipidemia, unspecified: Secondary | ICD-10-CM | POA: Diagnosis not present

## 2017-06-24 DIAGNOSIS — I10 Essential (primary) hypertension: Secondary | ICD-10-CM | POA: Diagnosis not present

## 2017-06-24 DIAGNOSIS — R7309 Other abnormal glucose: Secondary | ICD-10-CM | POA: Diagnosis not present

## 2017-06-24 NOTE — Telephone Encounter (Signed)
The carbidopa/levodopa 25/100 is generally a yellow pill from all vendors.  Just make sure that not confusing nighttime carbidopa/levodopa 50/200 with daytime.  Otherwise, I probably wouldn't make any changes.  Doubtful its the vendor

## 2017-06-24 NOTE — Telephone Encounter (Signed)
Patient's daughter Rodney Love called regarding Rodney Love's medication. She lmom and did not leave name of medication. [phone] available after 1:00 PM OR [phone] before 1:00 PM.  Please Call. Thanks

## 2017-06-24 NOTE — Telephone Encounter (Signed)
Patient's daughter states patient was given Carbidopa Levodopa 25/100 - but this month it was supplied by a different vendor. He has been on meds since Thursday and states he doesn't feel the same. He feels like it isn't working as well. He is jittery.   No other changes in meds. No urinary symptoms. He had a cold a couple weeks ago that lasted 3-4 days but no other recent illnesses.   She just wanted to call because she wasn't sure if it is in his head because the meds look different. Please advise.

## 2017-06-24 NOTE — Telephone Encounter (Signed)
LMOM making daughter aware.

## 2017-06-24 NOTE — Telephone Encounter (Signed)
Left message on machine for patient's daughter to call back.   

## 2017-06-25 ENCOUNTER — Other Ambulatory Visit: Payer: Self-pay | Admitting: Cardiovascular Disease

## 2017-06-25 DIAGNOSIS — I739 Peripheral vascular disease, unspecified: Secondary | ICD-10-CM

## 2017-07-04 ENCOUNTER — Ambulatory Visit: Payer: Medicare Other | Admitting: Cardiovascular Disease

## 2017-07-08 ENCOUNTER — Ambulatory Visit: Payer: Medicare Other | Admitting: Cardiovascular Disease

## 2017-07-08 ENCOUNTER — Encounter: Payer: Self-pay | Admitting: Cardiovascular Disease

## 2017-07-08 VITALS — BP 128/62 | HR 72 | Ht 72.0 in | Wt 197.0 lb

## 2017-07-08 DIAGNOSIS — Z0000 Encounter for general adult medical examination without abnormal findings: Secondary | ICD-10-CM

## 2017-07-08 DIAGNOSIS — I739 Peripheral vascular disease, unspecified: Secondary | ICD-10-CM

## 2017-07-08 NOTE — Progress Notes (Signed)
07/08/2017 Rodney Love   1945/05/26  [id number]  Primary Physician [name], MD Primary Cardiologist: Lorretta Harp MD Renae Gloss  HPI:  LAFE CLERK is a 72 y.o.  married Caucasian male father of 4, grandfather to 4 grandchildren accompanied by his wife  today. He is referred by Dr. Sallyanne Kuster for peripheral vascularevaluation. I last saw him in the office 06/22/13.His problems include hypertension, type 2 diabetes, hyperlipidemia. He had a cardiac catheterization performed 2008 revealing minimal CAD with normal function. He is a known occluded right carotid artery. He does have PAD by duplex ultrasound but asymptomatic from this. I saw him approximately 4 months ago and decided to follow him as needed. I did not think his Doppler results explained his symptoms. He was recently walking in [place] and noticed some left hip and leg cramping. Dopplers were performed that showed a right ABI 0.9 and a left ABI of 0.84. 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  Current Meds  Medication Sig  . aspirin EC 81 MG tablet Take 81 mg by mouth daily.  Marland Kitchen atorvastatin (LIPITOR) 20 MG tablet Take 0.5 tablets by mouth daily.  . carbidopa-levodopa (SINEMET CR) 50-200 MG tablet TAKE ONE TABLET BY MOUTH AT BEDTIME  . carbidopa-levodopa (SINEMET IR) 25-100 MG tablet TAKE ONE TABLET BY MOUTH THREE TIMES A DAY  . tamsulosin (FLOMAX) 0.4 MG CAPS capsule Take 1 capsule by mouth daily.     No Known Allergies  Social History   Socioeconomic History  . Marital status: Married    Spouse name: Not on file  . Number of children: Not on file  . Years of education: Not on file  . Highest education level: Not on file  Occupational History  . Occupation: retired    Comment: Barista  . Financial resource strain: Not on file  . Food insecurity:    Worry: Not on file    Inability: Not on file  .  Transportation needs:    Medical: Not on file    Non-medical: Not on file  Tobacco Use  . Smoking status: Former Smoker    Last attempt to quit: 04/02/1991    Years since quitting: 26.2  . Smokeless tobacco: Former Systems developer    Quit date: 08/06/1991  Substance and Sexual Activity  . Alcohol use: No  . Drug use: No  . Sexual activity: Not on file  Lifestyle  . Physical activity:    Days per week: Not on file    Minutes per session: Not on file  . Stress: Not on file  Relationships  . Social connections:    Talks on phone: Not on file    Gets together: Not on file    Attends religious service: Not on file    Active member of club or organization: Not on file    Attends meetings of clubs or organizations: Not on file    Relationship status: Not on file  . Intimate partner violence:    Fear of current or ex partner: Not on file    Emotionally abused: Not on file    Physically abused: Not on file    Forced sexual activity: Not on file  Other Topics Concern  . Not on file  Social History Narrative  . Not on file     Review of Systems: General: negative for chills, fever, night sweats or  weight changes.  Cardiovascular: negative for chest pain, dyspnea on exertion, edema, orthopnea, palpitations, paroxysmal nocturnal dyspnea or shortness of breath Dermatological: negative for rash Respiratory: negative for cough or wheezing Urologic: negative for hematuria Abdominal: negative for nausea, vomiting, diarrhea, bright red blood per rectum, melena, or hematemesis Neurologic: negative for visual changes, syncope, or dizziness All other systems reviewed and are otherwise negative except as noted above.    Blood pressure 128/62, pulse 72, height 6' (1.829 m), weight 197 lb (89.4 kg).  General appearance: alert and no distress Neck: no adenopathy, no carotid bruit, no JVD, supple, symmetrical, trachea midline and thyroid not enlarged, symmetric, no tenderness/mass/nodules Lungs: clear to  auscultation bilaterally Heart: regular rate and rhythm, S1, S2 normal, no murmur, click, rub or gallop Extremities: extremities normal, atraumatic, no cyanosis or edema Pulses: 2+ and symmetric Skin: Skin color, texture, turgor normal. No rashes or lesions Neurologic: Alert and oriented X 3, normal strength and tone. Normal symmetric reflexes. Normal coordination and gait  EKG Baseline artifact probably related to Parkinson's, sinus rhythm with sinus arrhythmia and PACs. I personally reviewed this EKG.  ASSESSMENT AND PLAN:   PAD (peripheral artery disease) s/p remote left external iliac artery angioplasty History of peripheral arterial disease status post remote left iliac angioplasty. Recently had an episode of left hip and leg pain while walking at [facility] at Doppler studies that showed a right ABI 0.98 and a left 0.84. The Dopplers did show a moderate stenosis in his right external iliac artery but really no blockage of any significance on the left side.      Lorretta Harp MD FACP,FACC,FAHA, [facility] 07/08/2017 11:40 AM

## 2017-07-08 NOTE — Assessment & Plan Note (Signed)
History of peripheral arterial disease status post remote left iliac angioplasty. Recently had an episode of left hip and leg pain while walking at [facility] at Doppler studies that showed a right ABI 0.98 and a left 0.84. The Dopplers did show a moderate stenosis in his right external iliac artery but really no blockage of any significance on the left side.

## 2017-07-08 NOTE — Patient Instructions (Signed)
Medication Instructions:  Your physician recommends that you continue on your current medications as directed. Please refer to the Current Medication list given to you today.   Labwork: none  Testing/Procedures: none  Follow-Up: Follow up with Dr. Berry as needed.   Any Other Special Instructions Will Be Listed Below (If Applicable).     If you need a refill on your cardiac medications before your next appointment, please call your pharmacy.   

## 2017-07-16 DIAGNOSIS — S93401A Sprain of unspecified ligament of right ankle, initial encounter: Secondary | ICD-10-CM | POA: Diagnosis not present

## 2017-07-16 DIAGNOSIS — R609 Edema, unspecified: Secondary | ICD-10-CM | POA: Diagnosis not present

## 2017-07-16 DIAGNOSIS — M25571 Pain in right ankle and joints of right foot: Secondary | ICD-10-CM | POA: Diagnosis not present

## 2017-07-16 DIAGNOSIS — E1351 Other specified diabetes mellitus with diabetic peripheral angiopathy without gangrene: Secondary | ICD-10-CM | POA: Diagnosis not present

## 2017-08-08 NOTE — Progress Notes (Signed)
Rodney Love was seen today in the movement disorders clinic for neurologic consultation at the request of [name], MD.  The consultation is for the evaluation of tremor.  The records that were made available to me were reviewed. This patient is accompanied in the office by his daughter who supplements the history.  The records that were made available to me were reviewed.  Cardiology notes do indicate resting tremor.  Cardiology thought that it was likely ET and propranolol was started on 11/22/15.  He was given 10 mg bid but states that he couldn't handle that due to lightheaded/dizzy and went to 5 mg bid.  He thinks that it helps some.   02/13/16 update:  Pt was seen in f/u, accompanied by his daughter who supplements the history.  Started on carbidopa/levodopa 25/100 tid last visit after being seen and dx with PD.  If he doesn't take it on time, he will get "jittery."  His propranolol was d/c by his cardiologist.  He had an MRI brain of the brain on 12/25/15 demonstrating scattered, mild-mod T2 hyperintensities, slighly more at grey-white jxn. It also demonstrated a known, right carotid occlusion, which cardiology has been following with carotid u/s, last done in March, 2017.  He is still attending PD therapies at the neurorehab center and those notes were reviewed.  06/13/16 update:  Patient follows up today, accompanied by his daughter who supplements the history.  Patient remains on carbidopa/levodopa 25/100, one tablet 3 times per day (6:30am/11:30am/5:30pm).  Overall, he thinks he has been stable.  He finished rehabilitation therapies at the end of December.  Pt denies falls.  Pt denies lightheadedness, near syncope.  No hallucinations.  Mood has been stable.  States that he has been trying to walk daily for exercise.  11/14/16 update:  Patient seen today in follow-up for his Parkinson's disease, accompanied by his daughter who supplements the history.  Patient is on carbidopa/levodopa  25/100, one tablet 3 times per day (6:30am/11:30am/5:30pm).  He notes that at 3-4am, he will awaken tremulous.    Pt denies falls.  Pt denies lightheadedness, near syncope.  No hallucinations.  Mood has been good.  Seen at rehab and order requested at end July for ST/OT/PT  04/11/16 update: Patient seen today in follow-up for Parkinson's disease.  He is accompanied by his daughter who supplements the history.  Patient is on carbidopa/levodopa 25/100, 1 tablet 3 times per day.  Last visit, we added carbidopa/levodopa 50/200 at bedtime.  This was primarily because he was waking up at night tremulous.  He tells me today that this helped.  He has attended therapy since our last visit and I reviewed those records.  He is exercising minimally - "just walks and paces" per his daughter.   Pt denies falls.  Pt denies lightheadedness, near syncope.  No hallucinations.  Mood has been good.  08/12/17 update: Patient is seen today in follow-up for Parkinson's disease.  Patient is accompanied by his daughter who supplements the history.  The patient is on carbidopa/levodopa 25/100, 1 tablet 3 times per day (6:30am/11am/5pm) and carbidopa/levodopa 50/200 at bedtime.  Some wearing off of medication before time to take, but is only about 20-30 min prior to next dosage.  Its the AM dosage that wears off the most consistently.   When it wears off, he gets "jittery."    Pt denies falls.  He did sprain his ankle by stepping on something outside and his foot "rolled."  He did have  a near fall at [facility] because he got a leg cramp (10:30-11am).  Walking some for exercise.    Pt denies lightheadedness, near syncope.  No hallucinations.  Mood has been good.  The records that were made available to me were reviewed.  Saw cardiology on 07/08/17 for PAD.   ALLERGIES:  No Known Allergies  CURRENT MEDICATIONS:  Outpatient Encounter Medications as of 08/12/2017  Medication Sig  . aspirin EC 81 MG tablet Take 81 mg by mouth daily.  Marland Kitchen  atorvastatin (LIPITOR) 20 MG tablet Take 0.5 tablets by mouth daily.  . carbidopa-levodopa (SINEMET CR) 50-200 MG tablet TAKE ONE TABLET BY MOUTH AT BEDTIME  . carbidopa-levodopa (SINEMET IR) 25-100 MG tablet TAKE ONE TABLET BY MOUTH THREE TIMES A DAY  . tamsulosin (FLOMAX) 0.4 MG CAPS capsule Take 1 capsule by mouth daily.   No facility-administered encounter medications on file as of 08/12/2017.     PAST MEDICAL HISTORY:   Past Medical History:  Diagnosis Date  . Arthritis   . Constipation    OCCASIONAL  . Coronary artery disease   . Diabetes mellitus without complication ([place])   . Environmental allergies   . Hyperlipidemia   . Hypertension   . Inguinal hernia   . Nocturia   . Peripheral arterial disease ([place])     PAST SURGICAL HISTORY:   Past Surgical History:  Procedure Laterality Date  . BACK SURGERY    . CARDIAC CATHETERIZATION  05/29/2006   single vessel CAD w/moderate stenosis of the prox. RCA approx 40%,prox. LAD 20-30%,minor irreg. mid abdominal aorta,left iliac artery disease 50-60%  . CATARACT EXTRACTION Left 12/2016  . HERNIA REPAIR  [id number]  . INGUINAL HERNIA REPAIR N/A 09/23/2012   Procedure: LAPAROSCOPIC INGUINAL HERNIA with umbilical hernia;  Surgeon: Madilyn Hook, DO;  Location: [facility];  Service: General;  Laterality: N/A;  . INSERTION OF MESH Bilateral 09/23/2012   Procedure: INSERTION OF MESH;  Surgeon: Madilyn Hook, DO;  Location: [facility];  Service: General;  Laterality: Bilateral;    SOCIAL HISTORY:   Social History   Socioeconomic History  . Marital status: Married    Spouse name: Not on file  . Number of children: Not on file  . Years of education: Not on file  . Highest education level: Not on file  Occupational History  . Occupation: retired    Comment: Barista  . Financial resource strain: Not on file  . Food insecurity:    Worry: Not on file    Inability: Not on file  . Transportation needs:    Medical: Not on file     Non-medical: Not on file  Tobacco Use  . Smoking status: Former Smoker    Last attempt to quit: 04/02/1991    Years since quitting: 26.3  . Smokeless tobacco: Former Systems developer    Quit date: 08/06/1991  Substance and Sexual Activity  . Alcohol use: No  . Drug use: No  . Sexual activity: Not on file  Lifestyle  . Physical activity:    Days per week: Not on file    Minutes per session: Not on file  . Stress: Not on file  Relationships  . Social connections:    Talks on phone: Not on file    Gets together: Not on file    Attends religious service: Not on file    Active member of club or organization: Not on file    Attends meetings of clubs or organizations: Not on file  Relationship status: Not on file  . Intimate partner violence:    Fear of current or ex partner: Not on file    Emotionally abused: Not on file    Physically abused: Not on file    Forced sexual activity: Not on file  Other Topics Concern  . Not on file  Social History Narrative  . Not on file    FAMILY HISTORY:   Family Status  Relation Name Status  . Mother  Deceased  . Father  Deceased  . Brother  Deceased  . Sister  Deceased  . Sister  Deceased  . Sister  Alive  . Daughter  Alive  . Daughter  Alive  . Son  Alive    ROS:  A complete 10 system review of systems was obtained and was unremarkable apart from what is mentioned above.  PHYSICAL EXAMINATION:    VITALS:   There were no vitals filed for this visit.  GEN:  The patient appears stated age and is in NAD. HEENT:  Normocephalic, atraumatic.  The mucous membranes are moist. The superficial temporal arteries are without ropiness or tenderness. CV:  RRR Lungs:  CTAB Neck/HEME:  There are no carotid bruits bilaterally.  Neurological examination:  Orientation: The patient is alert and oriented x3.  Cranial nerves: There is good facial symmetry.   There is significant facial hypomima with decreased blink.   Extraocular muscles are intact. The  visual fields are full to confrontational testing. The speech is fluent and clear. Soft palate rises symmetrically and there is no tongue deviation. Hearing is intact to conversational tone. Sensation: Sensation is intact to light touch throughout Motor: Strength is 5/5 in the bilateral upper and lower extremities.   Shoulder shrug is equal and symmetric.  There is no pronator drift.   Movement examination: Tone: There is mod increase tremor in the LUE Abnormal movements: There is a LUE resting tremor Coordination:  There is slow heel taps and toe taps on the L.  Other RAMs are good.   Gait and Station: The patient has no difficulty arising out of a deep-seated chair without the use of the hands. The patient's stride length is good today and he has no trouble with turning.  Labs:   Lab work was received from his primary care physician dated March 26, 2017.  White blood cells were 5.2, hemoglobin 14.4, hematocrit 44.1 and platelets 164.  Sodium was 141, potassium 4.2, chloride 104, CO2 22, BUN 11, creatinine 0.99, AST 28, ALT 16, hemoglobin A1c 6.4 (6.0 in June, 2018)  ASSESSMENT/PLAN:  1.  Idiopathic Parkinson's disease.  The patient has tremor, bradykinesia, rigidity and mild postural instability.  -increase carbidopa/levodopa 25/100 to 1 tablet at 6am/10am/2pm/6pm.  Can chew/dissolve an extra as needed.  Set alarm on phone  -continue carbidopa/levodopa 50/200 cr q hs.    -encouraged patient to exercise  -pt education given.  Invited to symposium and PD drumming program  2.  Hyperreflexia  -MRI brain unremarkable except for small vessel disease and known R carotid occlusion.  No neck pain  3.  R carotid occlusion  -nothing further to be done in that regard  4. Follow up is anticipated in the next few months, sooner should new neurologic issues arise.  Much greater than 50% of this visit was spent in counseling and coordinating care.  Total face to face time:  25 min       Cc:   [name], MD

## 2017-08-12 ENCOUNTER — Encounter: Payer: Self-pay | Admitting: Neurology

## 2017-08-12 ENCOUNTER — Ambulatory Visit (INDEPENDENT_AMBULATORY_CARE_PROVIDER_SITE_OTHER): Payer: Medicare Other | Admitting: Neurology

## 2017-08-12 VITALS — BP 130/84 | HR 88 | Ht 72.0 in | Wt 199.0 lb

## 2017-08-12 DIAGNOSIS — G20 Parkinson's disease: Secondary | ICD-10-CM

## 2017-08-12 NOTE — Patient Instructions (Signed)
increase carbidopa/levodopa 25/100 to 1 tablet at 6am/10am/2pm/6pm.   Continue carbidopa/levodopa 50/200 at bedtime  Registration is OPEN!    Third Annual Parkinson's Education Symposium   To register: [email or web address]      Search:  [employer] person attending individually Questions: [place], [place] or [email or web address] [email or web address]    Drumming for Adults with Parkinson's Thursdays 9:30-10:30 am May 2, May 9, & May 16 & Tuesdays 10:00-11:00 am June 4, June 18, & July 2 Elnora Morrison [employer], [address] [place]. To register: [phone] or email [email or web address]        [facility] for Parkinson's & Movement Disorders  The [facility] team know that living well with a movement disorder extends far beyond our clinic walls. We are together with you. Our team is passionate about providing resources to you and your loved ones who are living with Parkinson's disease and movement disorders. Participate in these programs and join our community. These resources are free or low cost!   [facility] is adding:   Innovative educational programs for patients and caregivers.   Support groups for patients and caregivers living with Parkinson's disease.   Parkinson's specific exercise programs.   Custom tailored therapeutic programs that will benefit patient's living with Parkinson's disease.   We are in this together. You can help and contribute to grow these programs and resources in our community. 100% of the funds donated to the [facility] stays right here in our community to support patients and their caregivers.  To make a tax deductible contribution:  -ask for a [facility] for Parkinson's envelope in the office today.  - call the Office of Institutional Advancement at [phone].

## 2017-09-10 ENCOUNTER — Ambulatory Visit: Payer: Medicare Other | Admitting: Cardiovascular Disease

## 2017-09-10 ENCOUNTER — Encounter: Payer: Self-pay | Admitting: Cardiovascular Disease

## 2017-09-10 VITALS — BP 128/67 | HR 62 | Ht 72.0 in | Wt 199.6 lb

## 2017-09-10 DIAGNOSIS — I2510 Atherosclerotic heart disease of native coronary artery without angina pectoris: Secondary | ICD-10-CM

## 2017-09-10 DIAGNOSIS — I6521 Occlusion and stenosis of right carotid artery: Secondary | ICD-10-CM | POA: Diagnosis not present

## 2017-09-10 DIAGNOSIS — E7800 Pure hypercholesterolemia, unspecified: Secondary | ICD-10-CM

## 2017-09-10 DIAGNOSIS — I739 Peripheral vascular disease, unspecified: Secondary | ICD-10-CM | POA: Diagnosis not present

## 2017-09-10 DIAGNOSIS — I10 Essential (primary) hypertension: Secondary | ICD-10-CM | POA: Diagnosis not present

## 2017-09-10 DIAGNOSIS — E119 Type 2 diabetes mellitus without complications: Secondary | ICD-10-CM | POA: Diagnosis not present

## 2017-09-10 NOTE — Patient Instructions (Signed)
Medication Instructions: Dr Croitoru recommends that you continue on your current medications as directed. Please refer to the Current Medication list given to you today.  Labwork: NONE ORDERED  Testing/Procedures: 1. Carotid Duplex - Your physician has requested that you have a carotid duplex. This test is an ultrasound of the carotid arteries in your neck. It looks at blood flow through these arteries that supply the brain with blood. Allow one hour for this exam. There are no restrictions or special instructions.  Follow-up: Dr Croitoru recommends that you schedule a follow-up appointment in 12 months. You will receive a reminder letter in the mail two months in advance. If you don't receive a letter, please call our office to schedule the follow-up appointment.  If you need a refill on your cardiac medications before your next appointment, please call your pharmacy. 

## 2017-09-10 NOTE — Progress Notes (Signed)
Cardiology Office Note    Date:  09/12/2017   ID:  Rodney Love, DOB 1945-10-27, MRN [id number]  PCP:  [name], MD  Cardiologist:   Sanda Klein, MD   Chief Complaint  Patient presents with  . Follow-up    History of Present Illness:  Rodney Love is a 72 y.o. male with an extensive history of atherosclerotic disease in the coronary, lower extremity and carotid artery beds. He is here for routine follow-up and has not had any new cardiovascular problems.   He generally feels well.  He is now taking carbidopa levodopa for his tremor.  He has not had any cardiovascular events since I last saw him.  Has intermittent claudication, exertional angina or dyspnea, leg edema or strokelike symptoms.  He has not had syncope and denies palpitations.  Started efforts to stay at a healthier weight and to eat better.  He is not taking any medications for his diabetes anymore.  Longer taking any blood pressure medications, since these caused orthostatic hypotension symptoms.  He did not tolerate propranolol.  Labs performed last December showed excellent control of his lipids and glycemia.  Renal function remains normal.  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 He had a remote angioplasty of the left external iliac maybe as long as 20 years ago and has not had symptoms since. He denies any nonhealing wounds or other ischemic changes in the feet. 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 He has never had a neurological event. Cardiac catheterization in 2008 showed 40% proximal RCA, 30% proximal LAD lesions. He has normal left ventricular systolic function. He has numerous but well treated this factors for atherosclerosis including type 2 diabetes,  hyperlipidemia and hypertension. He does not smoke and he exercises on a daily basis.  Past Medical History:  Diagnosis Date  . Arthritis   . Constipation    OCCASIONAL  . Coronary artery disease   . Diabetes mellitus without complication ([place])   . Environmental allergies   . Hyperlipidemia   . Hypertension   . Inguinal hernia   . Nocturia   . Peripheral arterial disease [facility])     Past Surgical History:  Procedure Laterality Date  . BACK SURGERY    . CARDIAC CATHETERIZATION  05/29/2006   single vessel CAD w/moderate stenosis of the prox. RCA approx 40%,prox. LAD 20-30%,minor irreg. mid abdominal aorta,left iliac artery disease 50-60%  . CATARACT EXTRACTION Left 12/2016  . HERNIA REPAIR  [id number]  . INGUINAL HERNIA REPAIR N/A 09/23/2012   Procedure: LAPAROSCOPIC INGUINAL HERNIA with umbilical hernia;  Surgeon: Madilyn Hook, DO;  [facility];  Service: General;  Laterality: N/A;  . INSERTION OF MESH Bilateral 09/23/2012   Procedure: INSERTION OF MESH;  Surgeon: Madilyn Hook, DO;  Location: [facility];  Service: General;  Laterality: Bilateral;    Current Medications: Outpatient Medications Prior to Visit  Medication Sig Dispense Refill  . aspirin EC 81 MG tablet Take 81 mg by mouth daily.    Marland Kitchen atorvastatin (LIPITOR) 20 MG tablet Take 0.5 tablets by mouth daily.    . carbidopa-levodopa (SINEMET CR) 50-200 MG tablet TAKE ONE TABLET BY MOUTH AT BEDTIME 90 tablet 1  . carbidopa-levodopa (SINEMET IR) 25-100 MG tablet TAKE ONE TABLET BY  MOUTH THREE TIMES A DAY 270 tablet 1  . tamsulosin (FLOMAX) 0.4 MG CAPS capsule Take 1 capsule by mouth daily.     No facility-administered medications prior to visit.      Allergies:   Patient has no known allergies.   Social History   Socioeconomic History  . Marital status: Married    Spouse name: Not on file  . Number of children: Not on file  . Years of education: Not on file  . Highest education level: Not on file  Occupational History    . Occupation: retired    Comment: Barista  . Financial resource strain: Not on file  . Food insecurity:    Worry: Not on file    Inability: Not on file  . Transportation needs:    Medical: Not on file    Non-medical: Not on file  Tobacco Use  . Smoking status: Former Smoker    Last attempt to quit: 04/02/1991    Years since quitting: 26.4  . Smokeless tobacco: Former Systems developer    Quit date: 08/06/1991  Substance and Sexual Activity  . Alcohol use: No  . Drug use: No  . Sexual activity: Not on file  Lifestyle  . Physical activity:    Days per week: Not on file    Minutes per session: Not on file  . Stress: Not on file  Relationships  . Social connections:    Talks on phone: Not on file    Gets together: Not on file    Attends religious service: Not on file    Active member of club or organization: Not on file    Attends meetings of clubs or organizations: Not on file    Relationship status: Not on file  Other Topics Concern  . Not on file  Social History Narrative  . Not on file     Family History:  The patient's family history includes Cancer in his sister and sister; Diabetes in his mother and sister; Kidney disease in his mother; Stroke in his brother and father.   ROS:   Please see the history of present illness.    ROS All other systems reviewed and are negative.   PHYSICAL EXAM:   VS:  BP 128/67   Pulse 62   Ht 6' (1.829 m)   Wt 199 lb 9.6 oz (90.5 kg)   BMI 27.07 kg/m     General: Alert, oriented x3, no distress, minimally overweight Head: no evidence of trauma, PERRL, EOMI, no exophtalmos or lid lag, no myxedema, no xanthelasma; normal ears, nose and oropharynx Neck: normal jugular venous pulsations and no hepatojugular reflux; brisk carotid pulses without delay and no carotid bruits Chest: clear to auscultation, no signs of consolidation by percussion or palpation, normal fremitus, symmetrical and full respiratory  excursions Cardiovascular: normal position and quality of the apical impulse, regular rhythm, normal first and second heart sounds, no murmurs, rubs or gallops Abdomen: no tenderness or distention, no masses by palpation, no abnormal pulsatility or arterial bruits, normal bowel sounds, no hepatosplenomegaly Extremities: no clubbing, cyanosis or edema; 2+ radial, ulnar and brachial pulses bilaterally; 2+ right femoral, posterior tibial and dorsalis pedis pulses; 2+ left femoral, posterior tibial and dorsalis pedis pulses; no subclavian or femoral bruits Neurological: grossly nonfocal fine tremor at rest in both hands Psych: Normal mood and affect   Wt Readings from Last 3 Encounters:  09/10/17 199 lb 9.6 oz (90.5 kg)  08/12/17 199 lb (90.3 kg)  07/08/17 197 lb (89.4 kg)      Studies/Labs Reviewed:   EKG:  EKG is ordered today.  The ekg ordered today demonstrates normal sinus rhythm, normal tracing, QTC 416 ms  Recent Labs: March 26, 2017 Total cholesterol 98, HDL 38, LDL 46, triglycerides 69 Hemoglobin A1c 6.4% BUN 11, creatinine 0.98, potassium 4.2, normal liver function tests Hemoglobin 14.4 ASSESSMENT:    1. Carotid occlusion, right   2. Atherosclerosis of native coronary artery of native heart without angina pectoris   3. PAD (peripheral artery disease) ([place])   4. Essential hypertension   5. Diabetes mellitus type 2 in nonobese (HCC)   6. Hypercholesterolemia      PLAN:  In order of problems listed above:  1. Carotid stenosis: Known total occlusion of the right carotid and minimal plaque in the left carotid, unchanged on several serial yearly duplex ultrasound tests.  Due to the stable findings, we are only checking this every other year.  It is time to do that this month.   2. CAD: Nonobstructive by remote cath.  Mains asymptomatic 3. PAD: Despite significant obstruction in the left iliac and femoral arteries, he denies any problems with intermittent  claudication 4. HTN: Denies problems with orthostatic dizziness, after his ACE inhibitor was discontinued 5. DM: We will of his glycemia without medications now that he has lost weight and is watching his diet. 6. HLP: Slightly low HDL cholesterol, all other lipid parameters are well within target range. He is on statin therapy.    Medication Adjustments/Labs and Tests Ordered: Current medicines are reviewed at length with the patient today.  Concerns regarding medicines are outlined above.  Medication changes, Labs and Tests ordered today are listed in the Patient Instructions below. Patient Instructions  Medication Instructions: Dr Sallyanne Kuster recommends that you continue on your current medications as directed. Please refer to the Current Medication list given to you today.  Labwork: NONE ORDERED  Testing/Procedures: 1. Carotid Duplex - Your physician has requested that you have a carotid duplex. This test is an ultrasound of the carotid arteries in your neck. It looks at blood flow through these arteries that supply the brain with blood. Allow one hour for this exam. There are no restrictions or special instructions.  Follow-up: Dr Sallyanne Kuster recommends that you schedule a follow-up appointment in 12 months. You will receive a reminder letter in the mail two months in advance. If you don't receive a letter, please call our office to schedule the follow-up appointment.  If you need a refill on your cardiac medications before your next appointment, please call your pharmacy.    Signed, Sanda Klein, MD  09/12/2017 5:41 PM    [facility] [place], [place], [place]  [address] Phone: [phone]; Fax: [phone]

## 2017-09-12 ENCOUNTER — Ambulatory Visit (HOSPITAL_COMMUNITY)
Admission: RE | Admit: 2017-09-12 | Discharge: 2017-09-12 | Disposition: A | Discharge status: Home / Self Care | Payer: Medicare Other | Source: Ambulatory Visit | Attending: Cardiology | Admitting: Cardiology

## 2017-09-12 DIAGNOSIS — I6521 Occlusion and stenosis of right carotid artery: Secondary | ICD-10-CM | POA: Diagnosis not present

## 2017-09-26 ENCOUNTER — Telehealth: Payer: Self-pay

## 2017-09-26 DIAGNOSIS — I6521 Occlusion and stenosis of right carotid artery: Secondary | ICD-10-CM

## 2017-09-26 NOTE — Telephone Encounter (Signed)
-----   Message from Sanda Klein, MD sent at 09/15/2017  8:48 AM EDT ----- No changes. Please repeat in 12 months

## 2017-09-26 NOTE — Telephone Encounter (Signed)
Repeat study ordered. 

## 2017-10-09 ENCOUNTER — Ambulatory Visit: Payer: Medicare Other | Admitting: Occupational Therapy

## 2017-10-09 ENCOUNTER — Ambulatory Visit: Payer: Medicare Other

## 2017-10-09 ENCOUNTER — Ambulatory Visit: Payer: Medicare Other | Attending: Neurology | Admitting: Physical Therapy

## 2017-10-09 DIAGNOSIS — R471 Dysarthria and anarthria: Secondary | ICD-10-CM | POA: Insufficient documentation

## 2017-10-09 DIAGNOSIS — R293 Abnormal posture: Secondary | ICD-10-CM | POA: Insufficient documentation

## 2017-10-09 DIAGNOSIS — R2689 Other abnormalities of gait and mobility: Secondary | ICD-10-CM

## 2017-10-09 NOTE — Therapy (Signed)
[facility] [address] [place], [place], [address] Phone: [phone]   Fax:  [phone]  Patient Details  Name: Rodney Love MRN: [id number] Date of Birth: 1945-10-02 Referring Provider:  Alonza Bogus, DO  Encounter Date: 10/09/2017  Speech Therapy Parkinson's Disease Screen   Decibel Level today: 70dB  (WNL=70-72 dB) average with sound level meter 30cm away from pt's mouth. Pt's conversational volume has remained essentially the same since last treatment course.  Pt has not experienced difficulty in swallowing warranting further evaluation.  Pt does does not require speech therapy services at this time. Recommend ST screen in another 6 months     [facility] ,MS, CCC-SLP  10/09/2017, 8:45 AM  [facility] [address] [place], [place], [address] Phone: [phone]   Fax:  [phone]

## 2017-10-09 NOTE — Therapy (Signed)
[facility] [address] [place], [place], [address] Phone: [phone]   Fax:  [phone]  Patient Details  Name: Rodney Love MRN: [id number] Date of Birth: 1945-08-11 Referring Provider:  [name], MD  Encounter Date: 10/09/2017 Occupational Therapy Parkinson's Disease Screen   Physical Performance Test item #2 (simulated eating):  13.62 sec  Physical Performance Test item #4 (donning/doffing jacket):  17.59 sec  Fastening/unfastening 3 buttons in:  27.19sec when wearing shirt  9-hole peg test:    RUE  28.38 secs         LUE  33.28 sec   Change in ability to perform ADLs/IADLs:  No denies  Other Comments:  Handwriting is legible and good size.    Pt does not require occupational therapy services at this time.  Recommended occupational therapy screen in   6 mons Yemariam Magar 10/09/2017, 8:21 AM  [facility] [address] [name], [place], [address] Phone: [phone]   Fax:  [phone]

## 2017-10-09 NOTE — Therapy (Signed)
[facility] [address] [place], [place], [address] Phone: [phone]   Fax:  [phone]  Physical Therapy Treatment  Patient Details  Name: Rodney Love MRN: [id number] Date of Birth: Jul 15, 1945 Referring Provider: Alonza Bogus   Encounter Date: 10/09/2017         Physical Therapy Parkinson's Disease Screen   Timed Up and Go test: 12.91 (compared to 11.31 sec)  10 meter walk test: 10.85 sec = 3.02 ft/sec (compared to 3.36 ft/sec)  5 time sit to stand test: 11.78 (compared to 11.12 sec)    Patient does not require Physical Therapy services at this time.  Recommend Physical Therapy screen in 6-9 months.                                Visit Diagnosis: Other abnormalities of gait and mobility     Cortlynn Hollinsworth W. 10/09/2017, 8:05 AM  Frazier Butt., PT   [facility] [address] [place], [place], [address] Phone: [phone]   Fax:  [phone]  Name: Rodney Love MRN: [id number] Date of Birth: 14-Nov-1945

## 2017-10-22 ENCOUNTER — Other Ambulatory Visit: Payer: Self-pay | Admitting: Neurology

## 2017-12-23 NOTE — Progress Notes (Signed)
Rodney Love was seen today in the movement disorders clinic for neurologic consultation at the request of [name], MD.  The consultation is for the evaluation of tremor.  The records that were made available to me were reviewed. This patient is accompanied in the office by his daughter who supplements the history.  The records that were made available to me were reviewed.  Cardiology notes do indicate resting tremor.  Cardiology thought that it was likely ET and propranolol was started on 11/22/15.  He was given 10 mg bid but states that he couldn't handle that due to lightheaded/dizzy and went to 5 mg bid.  He thinks that it helps some.   02/13/16 update:  Pt was seen in f/u, accompanied by his daughter who supplements the history.  Started on carbidopa/levodopa 25/100 tid last visit after being seen and dx with PD.  If he doesn't take it on time, he will get "jittery."  His propranolol was d/c by his cardiologist.  He had an MRI brain of the brain on 12/25/15 demonstrating scattered, mild-mod T2 hyperintensities, slighly more at grey-white jxn. It also demonstrated a known, right carotid occlusion, which cardiology has been following with carotid u/s, last done in March, 2017.  He is still attending PD therapies at the neurorehab center and those notes were reviewed.  06/13/16 update:  Patient follows up today, accompanied by his daughter who supplements the history.  Patient remains on carbidopa/levodopa 25/100, one tablet 3 times per day (6:30am/11:30am/5:30pm).  Overall, he thinks he has been stable.  He finished rehabilitation therapies at the end of December.  Pt denies falls.  Pt denies lightheadedness, near syncope.  No hallucinations.  Mood has been stable.  States that he has been trying to walk daily for exercise.  11/14/16 update:  Patient seen today in follow-up for his Parkinson's disease, accompanied by his daughter who supplements the history.  Patient is on carbidopa/levodopa  25/100, one tablet 3 times per day (6:30am/11:30am/5:30pm).  He notes that at 3-4am, he will awaken tremulous.    Pt denies falls.  Pt denies lightheadedness, near syncope.  No hallucinations.  Mood has been good.  Seen at rehab and order requested at end July for ST/OT/PT  04/11/16 update: Patient seen today in follow-up for Parkinson's disease.  He is accompanied by his daughter who supplements the history.  Patient is on carbidopa/levodopa 25/100, 1 tablet 3 times per day.  Last visit, we added carbidopa/levodopa 50/200 at bedtime.  This was primarily because he was waking up at night tremulous.  He tells me today that this helped.  He has attended therapy since our last visit and I reviewed those records.  He is exercising minimally - "just walks and paces" per his daughter.   Pt denies falls.  Pt denies lightheadedness, near syncope.  No hallucinations.  Mood has been good.  08/12/17 update: Patient is seen today in follow-up for Parkinson's disease.  Patient is accompanied by his daughter who supplements the history.  The patient is on carbidopa/levodopa 25/100, 1 tablet 3 times per day (6:30am/11am/5pm) and carbidopa/levodopa 50/200 at bedtime.  Some wearing off of medication before time to take, but is only about 20-30 min prior to next dosage.  Its the AM dosage that wears off the most consistently.   When it wears off, he gets "jittery."    Pt denies falls.  He did sprain his ankle by stepping on something outside and his foot "rolled."  He did have  a near fall at [facility] because he got a leg cramp (10:30-11am).  Walking some for exercise.    Pt denies lightheadedness, near syncope.  No hallucinations.  Mood has been good.  The records that were made available to me were reviewed.  Saw cardiology on 07/08/17 for PAD.  12/24/17 update: Patient seen today in follow-up for Parkinson's disease, accompanied by his daughter and wife who supplements history.  I increased the patient's medication last visit so  that he is now taking carbidopa/levodopa 25/100, 1 tablet 4 times per day and carbidopa/levodopa 50/200 at bedtime.  "That helped, especially in the morning."  Wife notes occasional wearing off of med.  He has had no falls since last visit.  No lightheadedness or near syncope.  No hallucinations.  Had screens through physical, occupational and speech therapy in early July.  Therapies were not needed at that time.  I reviewed those records. Very little exercise except walking at house.     ALLERGIES:  No Known Allergies  CURRENT MEDICATIONS:  Outpatient Encounter Medications as of 12/24/2017  Medication Sig  . aspirin EC 81 MG tablet Take 81 mg by mouth daily.  Marland Kitchen atorvastatin (LIPITOR) 20 MG tablet Take 0.5 tablets by mouth daily.  . carbidopa-levodopa (SINEMET CR) 50-200 MG tablet TAKE ONE TABLET BY MOUTH EVERY NIGHT AT BEDTIME  . carbidopa-levodopa (SINEMET IR) 25-100 MG tablet Take 1 tablet by mouth 4 (four) times daily.  . tamsulosin (FLOMAX) 0.4 MG CAPS capsule Take 1 capsule by mouth daily.   No facility-administered encounter medications on file as of 12/24/2017.     PAST MEDICAL HISTORY:   Past Medical History:  Diagnosis Date  . Arthritis   . Constipation    OCCASIONAL  . Coronary artery disease   . Diabetes mellitus without complication ([place])   . Environmental allergies   . Hyperlipidemia   . Hypertension   . Inguinal hernia   . Nocturia   . Peripheral arterial disease ([place])     PAST SURGICAL HISTORY:   Past Surgical History:  Procedure Laterality Date  . BACK SURGERY    . CARDIAC CATHETERIZATION  05/29/2006   single vessel CAD w/moderate stenosis of the prox. RCA approx 40%,prox. LAD 20-30%,minor irreg. mid abdominal aorta,left iliac artery disease 50-60%  . CATARACT EXTRACTION Left 12/2016  . HERNIA REPAIR  [id number]  . INGUINAL HERNIA REPAIR N/A 09/23/2012   Procedure: LAPAROSCOPIC INGUINAL HERNIA with umbilical hernia;  Surgeon: Madilyn Hook, DO;  Location: [facility];   Service: General;  Laterality: N/A;  . INSERTION OF MESH Bilateral 09/23/2012   Procedure: INSERTION OF MESH;  Surgeon: Madilyn Hook, DO;  Location: [facility];  Service: General;  Laterality: Bilateral;    SOCIAL HISTORY:   Social History   Socioeconomic History  . Marital status: Married    Spouse name: Not on file  . Number of children: Not on file  . Years of education: Not on file  . Highest education level: Not on file  Occupational History  . Occupation: retired    Comment: Barista  . Financial resource strain: Not on file  . Food insecurity:    Worry: Not on file    Inability: Not on file  . Transportation needs:    Medical: Not on file    Non-medical: Not on file  Tobacco Use  . Smoking status: Former Smoker    Last attempt to quit: 04/02/1991    Years since quitting: 26.7  . Smokeless tobacco: Former  User    Quit date: 08/06/1991  Substance and Sexual Activity  . Alcohol use: No  . Drug use: No  . Sexual activity: Not on file  Lifestyle  . Physical activity:    Days per week: Not on file    Minutes per session: Not on file  . Stress: Not on file  Relationships  . Social connections:    Talks on phone: Not on file    Gets together: Not on file    Attends religious service: Not on file    Active member of club or organization: Not on file    Attends meetings of clubs or organizations: Not on file    Relationship status: Not on file  . Intimate partner violence:    Fear of current or ex partner: Not on file    Emotionally abused: Not on file    Physically abused: Not on file    Forced sexual activity: Not on file  Other Topics Concern  . Not on file  Social History Narrative  . Not on file    FAMILY HISTORY:   Family Status  Relation Name Status  . Mother  Deceased  . Father  Deceased  . Brother  Deceased  . Sister  Deceased  . Sister  Deceased  . Sister  Alive  . Daughter  Alive  . Daughter  Alive  . Son  Alive    ROS: Review  of Systems  Constitutional: Negative.   HENT: Negative.   Respiratory: Negative.   Cardiovascular: Negative.   Skin: Negative.     PHYSICAL EXAMINATION:    VITALS:   Vitals:   12/24/17 1036  BP: 110/62  Pulse: 78  SpO2: 96%  Weight: 196 lb (88.9 kg)  Height: 6' (1.829 m)    GEN:  The patient appears stated age and is in NAD. HEENT:  Normocephalic, atraumatic.  The mucous membranes are moist. The superficial temporal arteries are without ropiness or tenderness. CV:  RRR Lungs:  CTAB Neck/HEME:  There are no carotid bruits bilaterally.  Neurological examination:  Orientation: The patient is alert and oriented x3. Cranial nerves: There is good facial symmetry. The speech is fluent and clear. Soft palate rises symmetrically and there is no tongue deviation. Hearing is intact to conversational tone. Sensation: Sensation is intact to light touch throughout Motor: Strength is 5/5 in the bilateral upper and lower extremities.   Shoulder shrug is equal and symmetric.  There is no pronator drift.  Movement examination: Tone: There is no definite increased tone but has significant trouble relaxing for proper evaluation Abnormal movements: There is a LUE resting tremor Coordination:  There is slow heel taps and toe taps on the L.  Other RAMs are good.   Gait and Station: The patient has no difficulty arising out of a deep-seated chair without the use of the hands. The patient's stride length is good today and he has no trouble with turning.  Labs:   Lab work was received from his primary care physician dated March 26, 2017.  White blood cells were 5.2, hemoglobin 14.4, hematocrit 44.1 and platelets 164.  Sodium was 141, potassium 4.2, chloride 104, CO2 22, BUN 11, creatinine 0.99, AST 28, ALT 16, hemoglobin A1c 6.4 (6.0 in June, 2018)  ASSESSMENT/PLAN:  1.  Idiopathic Parkinson's disease.  The patient has tremor, bradykinesia, rigidity and mild postural instability.  -continue  carbidopa/levodopa 25/100 qid.  Reminded him that he can take extra prn  -continue carbidopa/levodopa 50/200 cr  q hs.    -discussed exercise as related to PD.  Discussed safety.  Think okay to drive as he is right now (5 mile radius of home)  -wife not here previously so discussed pathophys, prognosis, etc  2.  Hyperreflexia  -MRI brain unremarkable except for small vessel disease and known R carotid occlusion.  No neck pain  3.  R carotid occlusion  -nothing further to be done in that regard  4.f/u 4-5 months.  Much greater than 50% of this visit was spent in counseling and coordinating care.  Total face to face time:  25 min       Cc:  [name], MD

## 2017-12-24 ENCOUNTER — Ambulatory Visit: Payer: Medicare Other | Admitting: Neurology

## 2017-12-24 ENCOUNTER — Encounter: Payer: Self-pay | Admitting: Neurology

## 2017-12-24 VITALS — BP 110/62 | HR 78 | Ht 72.0 in | Wt 196.0 lb

## 2017-12-24 DIAGNOSIS — G20 Parkinson's disease: Secondary | ICD-10-CM | POA: Diagnosis not present

## 2017-12-24 MED ORDER — CARBIDOPA-LEVODOPA ER 50-200 MG PO TBCR: 1.0000 | EXTENDED_RELEASE_TABLET | Freq: Every day | ORAL | 1 refills | Status: DC

## 2017-12-30 DIAGNOSIS — E559 Vitamin D deficiency, unspecified: Secondary | ICD-10-CM | POA: Diagnosis not present

## 2017-12-30 DIAGNOSIS — R7309 Other abnormal glucose: Secondary | ICD-10-CM | POA: Diagnosis not present

## 2017-12-30 DIAGNOSIS — G20 Parkinson's disease: Secondary | ICD-10-CM | POA: Diagnosis not present

## 2018-01-06 DIAGNOSIS — Z87891 Personal history of nicotine dependence: Secondary | ICD-10-CM | POA: Diagnosis not present

## 2018-01-06 DIAGNOSIS — E559 Vitamin D deficiency, unspecified: Secondary | ICD-10-CM | POA: Diagnosis not present

## 2018-01-06 DIAGNOSIS — R7309 Other abnormal glucose: Secondary | ICD-10-CM | POA: Diagnosis not present

## 2018-01-06 DIAGNOSIS — I2510 Atherosclerotic heart disease of native coronary artery without angina pectoris: Secondary | ICD-10-CM | POA: Diagnosis not present

## 2018-01-06 DIAGNOSIS — I10 Essential (primary) hypertension: Secondary | ICD-10-CM | POA: Diagnosis not present

## 2018-01-19 ENCOUNTER — Other Ambulatory Visit: Payer: Self-pay | Admitting: Neurology

## 2018-02-19 DIAGNOSIS — Z961 Presence of intraocular lens: Secondary | ICD-10-CM | POA: Diagnosis not present

## 2018-02-19 DIAGNOSIS — E119 Type 2 diabetes mellitus without complications: Secondary | ICD-10-CM | POA: Diagnosis not present

## 2018-04-06 DIAGNOSIS — E559 Vitamin D deficiency, unspecified: Secondary | ICD-10-CM | POA: Diagnosis not present

## 2018-04-06 DIAGNOSIS — I10 Essential (primary) hypertension: Secondary | ICD-10-CM | POA: Diagnosis not present

## 2018-04-06 DIAGNOSIS — E785 Hyperlipidemia, unspecified: Secondary | ICD-10-CM | POA: Diagnosis not present

## 2018-04-06 DIAGNOSIS — E118 Type 2 diabetes mellitus with unspecified complications: Secondary | ICD-10-CM | POA: Diagnosis not present

## 2018-04-06 DIAGNOSIS — Z119 Encounter for screening for infectious and parasitic diseases, unspecified: Secondary | ICD-10-CM | POA: Diagnosis not present

## 2018-04-09 DIAGNOSIS — E1169 Type 2 diabetes mellitus with other specified complication: Secondary | ICD-10-CM | POA: Diagnosis not present

## 2018-04-09 DIAGNOSIS — I779 Disorder of arteries and arterioles, unspecified: Secondary | ICD-10-CM | POA: Diagnosis not present

## 2018-04-09 DIAGNOSIS — Z87891 Personal history of nicotine dependence: Secondary | ICD-10-CM | POA: Diagnosis not present

## 2018-04-09 DIAGNOSIS — M79671 Pain in right foot: Secondary | ICD-10-CM | POA: Diagnosis not present

## 2018-04-09 DIAGNOSIS — Z1212 Encounter for screening for malignant neoplasm of rectum: Secondary | ICD-10-CM | POA: Diagnosis not present

## 2018-04-09 DIAGNOSIS — I2510 Atherosclerotic heart disease of native coronary artery without angina pectoris: Secondary | ICD-10-CM | POA: Diagnosis not present

## 2018-04-09 DIAGNOSIS — G20 Parkinson's disease: Secondary | ICD-10-CM | POA: Diagnosis not present

## 2018-04-09 DIAGNOSIS — Z0000 Encounter for general adult medical examination without abnormal findings: Secondary | ICD-10-CM | POA: Diagnosis not present

## 2018-04-09 DIAGNOSIS — M79672 Pain in left foot: Secondary | ICD-10-CM | POA: Diagnosis not present

## 2018-04-14 ENCOUNTER — Other Ambulatory Visit: Payer: Self-pay | Admitting: Neurology

## 2018-04-16 ENCOUNTER — Other Ambulatory Visit: Payer: Self-pay | Admitting: Neurology

## 2018-05-03 ENCOUNTER — Other Ambulatory Visit: Payer: Self-pay

## 2018-05-03 ENCOUNTER — Emergency Department (HOSPITAL_COMMUNITY)
Admission: EM | Admit: 2018-05-03 | Discharge: 2018-05-03 | Disposition: A | Discharge status: Home / Self Care | Payer: Medicare Other | Attending: Emergency Medicine | Admitting: Emergency Medicine

## 2018-05-03 ENCOUNTER — Emergency Department (HOSPITAL_COMMUNITY): Payer: Medicare Other

## 2018-05-03 ENCOUNTER — Encounter (HOSPITAL_COMMUNITY): Payer: Self-pay | Admitting: Emergency Medicine

## 2018-05-03 DIAGNOSIS — Y9301 Activity, walking, marching and hiking: Secondary | ICD-10-CM | POA: Insufficient documentation

## 2018-05-03 DIAGNOSIS — Z23 Encounter for immunization: Secondary | ICD-10-CM | POA: Diagnosis not present

## 2018-05-03 DIAGNOSIS — W19XXXA Unspecified fall, initial encounter: Secondary | ICD-10-CM

## 2018-05-03 DIAGNOSIS — Z87891 Personal history of nicotine dependence: Secondary | ICD-10-CM | POA: Diagnosis not present

## 2018-05-03 DIAGNOSIS — W0110XA Fall on same level from slipping, tripping and stumbling with subsequent striking against unspecified object, initial encounter: Secondary | ICD-10-CM | POA: Insufficient documentation

## 2018-05-03 DIAGNOSIS — S0081XA Abrasion of other part of head, initial encounter: Secondary | ICD-10-CM | POA: Diagnosis not present

## 2018-05-03 DIAGNOSIS — S0990XA Unspecified injury of head, initial encounter: Secondary | ICD-10-CM | POA: Insufficient documentation

## 2018-05-03 DIAGNOSIS — G20 Parkinson's disease: Secondary | ICD-10-CM | POA: Insufficient documentation

## 2018-05-03 DIAGNOSIS — Y929 Unspecified place or not applicable: Secondary | ICD-10-CM | POA: Insufficient documentation

## 2018-05-03 DIAGNOSIS — Y999 Unspecified external cause status: Secondary | ICD-10-CM | POA: Insufficient documentation

## 2018-05-03 DIAGNOSIS — I10 Essential (primary) hypertension: Secondary | ICD-10-CM | POA: Insufficient documentation

## 2018-05-03 DIAGNOSIS — T07XXXA Unspecified multiple injuries, initial encounter: Secondary | ICD-10-CM

## 2018-05-03 DIAGNOSIS — I2510 Atherosclerotic heart disease of native coronary artery without angina pectoris: Secondary | ICD-10-CM | POA: Diagnosis not present

## 2018-05-03 DIAGNOSIS — E119 Type 2 diabetes mellitus without complications: Secondary | ICD-10-CM | POA: Insufficient documentation

## 2018-05-03 DIAGNOSIS — S50311A Abrasion of right elbow, initial encounter: Secondary | ICD-10-CM | POA: Diagnosis not present

## 2018-05-03 DIAGNOSIS — S30810A Abrasion of lower back and pelvis, initial encounter: Secondary | ICD-10-CM | POA: Diagnosis not present

## 2018-05-03 DIAGNOSIS — S59901A Unspecified injury of right elbow, initial encounter: Secondary | ICD-10-CM | POA: Diagnosis not present

## 2018-05-03 MED ORDER — BACITRACIN ZINC 500 UNIT/GM EX OINT
TOPICAL_OINTMENT | Freq: Two times a day (BID) | CUTANEOUS | Status: DC
  Administered 2018-05-03: 1 via TOPICAL
  Filled 2018-05-03: qty 1.8

## 2018-05-03 MED ORDER — TETANUS-DIPHTH-ACELL PERTUSSIS 5-2.5-18.5 LF-MCG/0.5 IM SUSP
0.5000 mL | Freq: Once | INTRAMUSCULAR | Status: AC
  Administered 2018-05-03: 0.5 mL via INTRAMUSCULAR
  Filled 2018-05-03: qty 0.5

## 2018-05-03 NOTE — ED Provider Notes (Signed)
Emergency Department Provider Note   I have reviewed the triage vital signs and the nursing notes.   HISTORY  Chief Complaint Fall; Extremity Laceration; and Elbow Pain   HPI Rodney Love is a 73 y.o. male who presents the emergency department today secondary to a fall.  Patient has a history of Parkinson's disease and has trouble with turning too quickly and today he went to turning his feet did not follow his torso and he got off balance and fell down landing on his right elbow and the back of his head.  Patient sustained a small abrasion to his right elbow and had some unclear loss of consciousness so presented here for further evaluation.  No headache at this time.  No neurologic symptoms at this time.  No other wounds.  Patient without any difficulty ambulating since the fall. No other associated or modifying symptoms.    Past Medical History:  Diagnosis Date  . Arthritis   . Constipation    OCCASIONAL  . Coronary artery disease   . Diabetes mellitus without complication ([place])   . Environmental allergies   . Hyperlipidemia   . Hypertension   . Inguinal hernia   . Nocturia   . Peripheral arterial disease [facility])     Patient Active Problem List   Diagnosis Date Noted  . PD (Parkinson's disease) ([place]) 12/18/2015  . Tremor of both hands 11/22/2015  . PAD (peripheral artery disease) s/p remote left external iliac artery angioplasty 10/28/2012  . Coronary atherosclerosis 10/28/2012  . Hypercholesterolemia 10/28/2012  . Essential hypertension 10/28/2012  . Diabetes mellitus type 2 in nonobese ([place]) 10/28/2012  . Carotid occlusion, right 10/28/2012    Past Surgical History:  Procedure Laterality Date  . BACK SURGERY    . CARDIAC CATHETERIZATION  05/29/2006   single vessel CAD w/moderate stenosis of the prox. RCA approx 40%,prox. LAD 20-30%,minor irreg. mid abdominal aorta,left iliac artery disease 50-60%  . CATARACT EXTRACTION Left 12/2016  . HERNIA REPAIR  [id number]    . INGUINAL HERNIA REPAIR N/A 09/23/2012   Procedure: LAPAROSCOPIC INGUINAL HERNIA with umbilical hernia;  Surgeon: Madilyn Hook, DO;  Location: [facility];  Service: General;  Laterality: N/A;  . INSERTION OF MESH Bilateral 09/23/2012   Procedure: INSERTION OF MESH;  Surgeon: Madilyn Hook, DO;  Location: [facility];  Service: General;  Laterality: Bilateral;    Current Outpatient Rx  . Order #: [id number] Class: Historical Med  . Order #: [id number] Class: Historical Med  . Order #: [id number] Class: Normal  . Order #: [id number] Class: Normal  . Order #: [id number] Class: Historical Med  . Order #: [id number] Class: Historical Med  . Order #: [id number] Class: Normal    Allergies Patient has no known allergies.  Family History  Problem Relation Age of Onset  . Diabetes Mother   . Kidney disease Mother   . Stroke Father   . Stroke Brother   . Cancer Sister   . Cancer Sister   . Diabetes Sister     Social History Social History   Tobacco Use  . Smoking status: Former Smoker    Last attempt to quit: 04/02/1991    Years since quitting: 27.1  . Smokeless tobacco: Former Systems developer    Quit date: 08/06/1991  Substance Use Topics  . Alcohol use: No  . Drug use: No    Review of Systems  All other systems negative except as documented in the HPI. All pertinent positives and negatives as reviewed in the HPI. ____________________________________________   PHYSICAL EXAM:  VITAL SIGNS: ED Triage Vitals  Enc Vitals Group     BP 05/03/18 1336 (!) 149/87     Pulse Rate 05/03/18 1336 72     Resp 05/03/18 1336 18     Temp 05/03/18 1336 98 F (36.7 C)     Temp Source 05/03/18 1336 Oral     SpO2 05/03/18 1336 99 %     Weight --      Height --      Head Circumference --      Peak Flow --      Pain Score 05/03/18 1354 0     Pain Loc --      Pain Edu? --      Excl. in [place]? --     Constitutional: Alert and oriented. Well appearing and in no acute distress. Eyes: Conjunctivae are normal. PERRL.  EOMI. Head: Atraumatic. Nose: No congestion/rhinnorhea. Mouth/Throat: Mucous membranes are moist.  Oropharynx non-erythematous. Neck: No stridor.  No meningeal signs.   Cardiovascular: Normal rate, regular rhythm. Good peripheral circulation. Grossly normal heart sounds.   Respiratory: Normal respiratory effort.  No retractions. Lungs CTAB. Gastrointestinal: Soft and nontender. No distention.  Musculoskeletal: No lower extremity tenderness nor edema. No gross deformities of extremities. Neurologic:  Normal speech and language. No gross focal neurologic deficits are appreciated.  Skin:  Skin is warm, dry with abrasion to right elbow, hemostatic.   ____________________________________________   LABS (all labs ordered are listed, but only abnormal results are displayed)  Labs Reviewed - No data to display ____________________________________________   RADIOLOGY  Dg Elbow Complete Right  Result Date: 05/03/2018 CLINICAL DATA:  Fall EXAM: RIGHT ELBOW - COMPLETE 3+ VIEW COMPARISON:  None. FINDINGS: No acute fracture. No dislocation.  Unremarkable soft tissues. IMPRESSION: No acute bony pathology. Electronically Signed   By: Marybelle Killings M.D.   On: 05/03/2018 14:28   Ct Head Wo Contrast  Result Date: 05/03/2018 CLINICAL DATA:  Fall.  Head injury. EXAM: CT HEAD WITHOUT CONTRAST TECHNIQUE: Contiguous axial images were obtained from the base of the skull through the vertex without intravenous contrast. COMPARISON:  MRI 12/25/2015 FINDINGS: Brain: There is no evidence for acute hemorrhage, hydrocephalus, mass lesion, or abnormal extra-axial fluid collection. No definite CT evidence for acute infarction. Diffuse loss of parenchymal volume is consistent with atrophy. Patchy low attenuation in the deep hemispheric and periventricular white matter is nonspecific, but likely reflects chronic microvascular ischemic demyelination. Vascular: No hyperdense vessel or unexpected calcification. Skull: Normal.  Negative for fracture or focal lesion. Sinuses/Orbits: Chronic mucosal thickening identified in the maxillary sinuses with air-fluid levels in the maxillary sinuses bilaterally. Walls of the left maxillary sinus are sclerotic. Visualized portions of the globes and intraorbital fat are unremarkable. Other: None. IMPRESSION: 1. No acute intracranial abnormality. 2. Atrophy with chronic small vessel white matter ischemic disease. 3. 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 Electronically Signed   By: Misty Stanley M.D.   On: 05/03/2018 16:06    ____________________________________________   PROCEDURES  Procedure(s) performed:   Procedures   ____________________________________________   INITIAL IMPRESSION / ASSESSMENT AND PLAN / ED COURSE  Patient with fall likely secondary to Parkinson's but unclear if he syncopized or not after the fall so CT had done which was negative.  No lacerations requiring repair on elbow.     Pertinent labs & imaging results that were available during my care of the patient were reviewed by me and considered in my medical decision making (see chart for details).  ____________________________________________  FINAL CLINICAL IMPRESSION(S) / ED DIAGNOSES  Final diagnoses:  Fall, initial encounter  Multiple abrasions     MEDICATIONS GIVEN DURING THIS VISIT:  Medications  bacitracin ointment (1 application Topical Given 05/03/18 1623)  Tdap (BOOSTRIX) injection 0.5 mL (0.5 mLs Intramuscular Given 05/03/18 1613)     NEW OUTPATIENT MEDICATIONS STARTED DURING THIS VISIT:  Discharge Medication List as of 05/03/2018  4:24 PM      Note:  This note was prepared with assistance of Dragon voice recognition software. Occasional wrong-word or sound-a-like substitutions may have occurred due to the inherent limitations of voice recognition software.   Treshawn Allen, Corene Cornea, MD 05/03/18 [phone]

## 2018-05-03 NOTE — ED Triage Notes (Signed)
Pt with fall today, c/o pain and lac to R elbow. Pt hit posterior head. Denies headache.

## 2018-05-04 DIAGNOSIS — M7751 Other enthesopathy of right foot: Secondary | ICD-10-CM | POA: Diagnosis not present

## 2018-05-04 DIAGNOSIS — M21961 Unspecified acquired deformity of right lower leg: Secondary | ICD-10-CM | POA: Diagnosis not present

## 2018-05-07 NOTE — Progress Notes (Deleted)
[name] was seen today in the movement disorders clinic for neurologic consultation at the request of [name], MD.  The consultation is for the evaluation of tremor.  The records that were made available to me were reviewed. This patient is accompanied in the office by his daughter who supplements the history.  The records that were made available to me were reviewed.  Cardiology notes do indicate resting tremor.  Cardiology thought that it was likely ET and propranolol was started on 11/22/15.  He was given 10 mg bid but states that he couldn't handle that due to lightheaded/dizzy and went to 5 mg bid.  He thinks that it helps some.   02/13/16 update:  Pt was seen in f/u, accompanied by his daughter who supplements the history.  Started on carbidopa/levodopa 25/100 tid last visit after being seen and dx with PD.  If he doesn't take it on time, he will get "jittery."  His propranolol was d/c by his cardiologist.  He had an MRI brain of the brain on 12/25/15 demonstrating scattered, mild-mod T2 hyperintensities, slighly more at grey-white jxn. It also demonstrated a known, right carotid occlusion, which cardiology has been following with carotid u/s, last done in March, 2017.  He is still attending PD therapies at the neurorehab center and those notes were reviewed.  06/13/16 update:  Patient follows up today, accompanied by his daughter who supplements the history.  Patient remains on carbidopa/levodopa 25/100, one tablet 3 times per day (6:30am/11:30am/5:30pm).  Overall, he thinks he has been stable.  He finished rehabilitation therapies at the end of December.  Pt denies falls.  Pt denies lightheadedness, near syncope.  No hallucinations.  Mood has been stable.  States that he has been trying to walk daily for exercise.  11/14/16 update:  Patient seen today in follow-up for his Parkinson's disease, accompanied by his daughter who supplements the history.  Patient is on carbidopa/levodopa  25/100, one tablet 3 times per day (6:30am/11:30am/5:30pm).  He notes that at 3-4am, he will awaken tremulous.    Pt denies falls.  Pt denies lightheadedness, near syncope.  No hallucinations.  Mood has been good.  Seen at rehab and order requested at end July for ST/OT/PT  04/11/16 update: Patient seen today in follow-up for Parkinson's disease.  He is accompanied by his daughter who supplements the history.  Patient is on carbidopa/levodopa 25/100, 1 tablet 3 times per day.  Last visit, we added carbidopa/levodopa 50/200 at bedtime.  This was primarily because he was waking up at night tremulous.  He tells me today that this helped.  He has attended therapy since our last visit and I reviewed those records.  He is exercising minimally - "just walks and paces" per his daughter.   Pt denies falls.  Pt denies lightheadedness, near syncope.  No hallucinations.  Mood has been good.  08/12/17 update: Patient is seen today in follow-up for Parkinson's disease.  Patient is accompanied by his daughter who supplements the history.  The patient is on carbidopa/levodopa 25/100, 1 tablet 3 times per day (6:30am/11am/5pm) and carbidopa/levodopa 50/200 at bedtime.  Some wearing off of medication before time to take, but is only about 20-30 min prior to next dosage.  Its the AM dosage that wears off the most consistently.   When it wears off, he gets "jittery."    Pt denies falls.  He did sprain his ankle by stepping on something outside and his foot "rolled."  He did have  a near fall at [facility] because he got a leg cramp (10:30-11am).  Walking some for exercise.    Pt denies lightheadedness, near syncope.  No hallucinations.  Mood has been good.  The records that were made available to me were reviewed.  Saw cardiology on 07/08/17 for PAD.  12/24/17 update: Patient seen today in follow-up for Parkinson's disease, accompanied by his daughter and wife who supplements history.  I increased the patient's medication last visit so  that he is now taking carbidopa/levodopa 25/100, 1 tablet 4 times per day and carbidopa/levodopa 50/200 at bedtime.  "That helped, especially in the morning."  Wife notes occasional wearing off of med.  He has had no falls since last visit.  No lightheadedness or near syncope.  No hallucinations.  Had screens through physical, occupational and speech therapy in early July.  Therapies were not needed at that time.  I reviewed those records. Very little exercise except walking at house.    05/11/18 update: Patient is seen today in follow-up for Parkinson's disease.  He is accompanied by his family who supplements the history.  Patient is on carbidopa/levodopa 25/100, 1 tablet 4 times per day and carbidopa/levodopa 50/200 at bedtime.  Records are reviewed since last visit.  He was in the emergency room on February 2 with a fall.  He was trying to turn and ended up falling and landing on his right elbow and hit the back of his head.  CT of the brain was completed and was negative.  The patient was released from the emergency room.  No lightheadedness or near syncope.   ALLERGIES:  No Known Allergies  CURRENT MEDICATIONS:  Outpatient Encounter Medications as of 05/11/2018  Medication Sig  . aspirin EC 81 MG tablet Take 81 mg by mouth daily.  Marland Kitchen atorvastatin (LIPITOR) 20 MG tablet Take 20 mg by mouth daily.   . carbidopa-levodopa (SINEMET CR) 50-200 MG tablet Take 1 tablet by mouth at bedtime. (Patient not taking: Reported on 05/03/2018)  . carbidopa-levodopa (SINEMET CR) 50-200 MG tablet TAKE ONE TABLET BY MOUTH EVERY NIGHT AT BEDTIME  . carbidopa-levodopa (SINEMET IR) 25-100 MG tablet TAKE ONE TABLET BY MOUTH FOUR TIMES A DAY  . Cholecalciferol (VITAMIN D) 50 MCG (2000 UT) CAPS Take 1 capsule by mouth daily.  . tamsulosin (FLOMAX) 0.4 MG CAPS capsule Take 1 capsule by mouth every evening.    No facility-administered encounter medications on file as of 05/11/2018.     PAST MEDICAL HISTORY:   Past Medical  History:  Diagnosis Date  . Arthritis   . Constipation    OCCASIONAL  . Coronary artery disease   . Diabetes mellitus without complication ([place])   . Environmental allergies   . Hyperlipidemia   . Hypertension   . Inguinal hernia   . Nocturia   . Peripheral arterial disease ([place])     PAST SURGICAL HISTORY:   Past Surgical History:  Procedure Laterality Date  . BACK SURGERY    . CARDIAC CATHETERIZATION  05/29/2006   single vessel CAD w/moderate stenosis of the prox. RCA approx 40%,prox. LAD 20-30%,minor irreg. mid abdominal aorta,left iliac artery disease 50-60%  . CATARACT EXTRACTION Left 12/2016  . HERNIA REPAIR  [id number]  . INGUINAL HERNIA REPAIR N/A 09/23/2012   Procedure: LAPAROSCOPIC INGUINAL HERNIA with umbilical hernia;  Surgeon: Madilyn Hook, DO;  Location: [facility];  Service: General;  Laterality: N/A;  . INSERTION OF MESH Bilateral 09/23/2012   Procedure: INSERTION OF MESH;  Surgeon: Madilyn Hook, DO;  Location: [facility];  Service: General;  Laterality: Bilateral;    SOCIAL HISTORY:   Social History   Socioeconomic History  . Marital status: Married    Spouse name: Not on file  . Number of children: Not on file  . Years of education: Not on file  . Highest education level: Not on file  Occupational History  . Occupation: retired    Comment: Barista  . Financial resource strain: Not on file  . Food insecurity:    Worry: Not on file    Inability: Not on file  . Transportation needs:    Medical: Not on file    Non-medical: Not on file  Tobacco Use  . Smoking status: Former Smoker    Last attempt to quit: 04/02/1991    Years since quitting: 27.1  . Smokeless tobacco: Former Systems developer    Quit date: 08/06/1991  Substance and Sexual Activity  . Alcohol use: No  . Drug use: No  . Sexual activity: Not on file  Lifestyle  . Physical activity:    Days per week: Not on file    Minutes per session: Not on file  . Stress: Not on file  Relationships  .  Social connections:    Talks on phone: Not on file    Gets together: Not on file    Attends religious service: Not on file    Active member of club or organization: Not on file    Attends meetings of clubs or organizations: Not on file    Relationship status: Not on file  . Intimate partner violence:    Fear of current or ex partner: Not on file    Emotionally abused: Not on file    Physically abused: Not on file    Forced sexual activity: Not on file  Other Topics Concern  . Not on file  Social History Narrative  . Not on file    FAMILY HISTORY:   Family Status  Relation Name Status  . Mother  Deceased  . Father  Deceased  . Brother  Deceased  . Sister  Deceased  . Sister  Deceased  . Sister  Alive  . Daughter  Alive  . Daughter  Alive  . Son  Alive    ROS: ROS  PHYSICAL EXAMINATION:    VITALS:   There were no vitals filed for this visit.  GEN:  The patient appears stated age and is in NAD. HEENT:  Normocephalic, atraumatic.  The mucous membranes are moist. The superficial temporal arteries are without ropiness or tenderness. CV:  RRR Lungs:  CTAB Neck/HEME:  There are no carotid bruits bilaterally.  Neurological examination:  Orientation: The patient is alert and oriented x3. Cranial nerves: There is good facial symmetry. The speech is fluent and clear. Soft palate rises symmetrically and there is no tongue deviation. Hearing is intact to conversational tone. Sensation: Sensation is intact to light touch throughout Motor: Strength is 5/5 in the bilateral upper and lower extremities.   Shoulder shrug is equal and symmetric.  There is no pronator drift.  Movement examination: Tone: There is no definite increased tone but has significant trouble relaxing for proper evaluation Abnormal movements: There is a LUE resting tremor Coordination:  There is slow heel taps and toe taps on the L.  Other RAMs are good.   Gait and Station: The patient has no difficulty  arising out of a deep-seated chair without the use of the hands. The patient's stride  length is good today and he has no trouble with turning.  Labs:   Lab work was received from his primary care physician dated March 26, 2017.  White blood cells were 5.2, hemoglobin 14.4, hematocrit 44.1 and platelets 164.  Sodium was 141, potassium 4.2, chloride 104, CO2 22, BUN 11, creatinine 0.99, AST 28, ALT 16, hemoglobin A1c 6.4 (6.0 in June, 2018)  ASSESSMENT/PLAN:  1.  Idiopathic Parkinson's disease.  The patient has tremor, bradykinesia, rigidity and mild postural instability.  -continue carbidopa/levodopa 25/100 qid.  Reminded him that he can take extra prn  -continue carbidopa/levodopa 50/200 cr q hs.     2.  Hyperreflexia  -MRI brain unremarkable except for small vessel disease and known R carotid occlusion.  No neck pain  3.  R carotid occlusion  -nothing further to be done in that regard  4.         Cc:  [name], MD

## 2018-05-11 ENCOUNTER — Telehealth: Payer: Self-pay | Admitting: Neurology

## 2018-05-11 ENCOUNTER — Ambulatory Visit: Payer: Medicare Other | Admitting: Neurology

## 2018-05-11 NOTE — Telephone Encounter (Signed)
Patient Daughter called and states that she needed to resch appt due to her father hurting his foot and not being able to walk on it. She did not think that Dr Tat would want patient to wait until June. I have himon the wait list but please advise

## 2018-05-11 NOTE — Telephone Encounter (Signed)
He can stay on the wait list and we will call if anything opens sooner.

## 2018-05-11 NOTE — Telephone Encounter (Signed)
Ok I will let the patient daughter know

## 2018-05-25 DIAGNOSIS — M7752 Other enthesopathy of left foot: Secondary | ICD-10-CM | POA: Diagnosis not present

## 2018-05-25 DIAGNOSIS — M7672 Peroneal tendinitis, left leg: Secondary | ICD-10-CM | POA: Diagnosis not present

## 2018-05-25 DIAGNOSIS — M25572 Pain in left ankle and joints of left foot: Secondary | ICD-10-CM | POA: Diagnosis not present

## 2018-05-26 ENCOUNTER — Ambulatory Visit: Payer: Medicare Other | Admitting: Occupational Therapy

## 2018-05-26 ENCOUNTER — Ambulatory Visit: Payer: Medicare Other | Admitting: Physical Therapy

## 2018-05-26 ENCOUNTER — Ambulatory Visit: Payer: Medicare Other | Admitting: Speech Pathology

## 2018-06-17 DIAGNOSIS — E1151 Type 2 diabetes mellitus with diabetic peripheral angiopathy without gangrene: Secondary | ICD-10-CM | POA: Diagnosis not present

## 2018-06-17 DIAGNOSIS — M25572 Pain in left ankle and joints of left foot: Secondary | ICD-10-CM | POA: Diagnosis not present

## 2018-06-17 DIAGNOSIS — M7672 Peroneal tendinitis, left leg: Secondary | ICD-10-CM | POA: Diagnosis not present

## 2018-06-17 DIAGNOSIS — B351 Tinea unguium: Secondary | ICD-10-CM | POA: Diagnosis not present

## 2018-06-17 DIAGNOSIS — M79671 Pain in right foot: Secondary | ICD-10-CM | POA: Diagnosis not present

## 2018-06-17 DIAGNOSIS — M79672 Pain in left foot: Secondary | ICD-10-CM | POA: Diagnosis not present

## 2018-06-19 ENCOUNTER — Telehealth: Payer: Self-pay | Admitting: Neurology

## 2018-06-19 NOTE — Telephone Encounter (Signed)
Mary (Daughter) called regarding needing to find out what all the E Visit consists of. She also wanted to reschedule a follow up Visit for a few months out. She is not sure he can do an E visit. She would also like to be present when he has the E Visit. Please Call. Thanks

## 2018-06-22 ENCOUNTER — Ambulatory Visit (HOSPITAL_COMMUNITY)
Admission: RE | Admit: 2018-06-22 | Discharge: 2018-06-22 | Disposition: A | Discharge status: Home / Self Care | Payer: Medicare Other | Source: Ambulatory Visit | Attending: Internal Medicine | Admitting: Internal Medicine

## 2018-06-22 ENCOUNTER — Other Ambulatory Visit (HOSPITAL_COMMUNITY): Payer: Self-pay | Admitting: Cardiovascular Disease

## 2018-06-22 ENCOUNTER — Ambulatory Visit (HOSPITAL_BASED_OUTPATIENT_CLINIC_OR_DEPARTMENT_OTHER)
Admission: RE | Admit: 2018-06-22 | Discharge: 2018-06-22 | Disposition: A | Discharge status: Home / Self Care | Payer: Medicare Other | Source: Ambulatory Visit | Attending: Internal Medicine | Admitting: Internal Medicine

## 2018-06-22 DIAGNOSIS — I739 Peripheral vascular disease, unspecified: Secondary | ICD-10-CM

## 2018-06-22 DIAGNOSIS — Z9862 Peripheral vascular angioplasty status: Secondary | ICD-10-CM | POA: Diagnosis not present

## 2018-06-22 NOTE — Telephone Encounter (Signed)
Spoke with patient's daughter about e-visits. They want to be added to the list.   Verified that they do have a video compatible device. Best number to reach them is [phone]. Email: [email or web address]

## 2018-06-22 NOTE — Telephone Encounter (Signed)
Left message on machine for patient's daughter to call back.   

## 2018-06-23 ENCOUNTER — Ambulatory Visit: Payer: Medicare Other | Admitting: Occupational Therapy

## 2018-06-23 ENCOUNTER — Encounter

## 2018-06-23 ENCOUNTER — Other Ambulatory Visit: Payer: Self-pay | Admitting: *Deleted

## 2018-06-23 ENCOUNTER — Ambulatory Visit: Payer: Medicare Other

## 2018-06-23 ENCOUNTER — Ambulatory Visit: Payer: Medicare Other | Admitting: Physical Therapy

## 2018-06-23 ENCOUNTER — Ambulatory Visit: Payer: Medicare Other | Admitting: Neurology

## 2018-06-23 DIAGNOSIS — I739 Peripheral vascular disease, unspecified: Secondary | ICD-10-CM

## 2018-06-25 ENCOUNTER — Telehealth: Payer: Self-pay | Admitting: Neurology

## 2018-06-25 MED ORDER — CARBIDOPA-LEVODOPA ER 50-200 MG PO TBCR: 1.0000 | EXTENDED_RELEASE_TABLET | Freq: Every day | ORAL | 0 refills | Status: DC

## 2018-06-25 MED ORDER — CARBIDOPA-LEVODOPA 25-100 MG PO TABS: 1.0000 | ORAL_TABLET | Freq: Four times a day (QID) | ORAL | 0 refills | Status: DC

## 2018-06-25 NOTE — Telephone Encounter (Signed)
RX sent to pharmacy. Responded to daughter via [employer].

## 2018-06-25 NOTE — Telephone Encounter (Signed)
Patient daughter [name] needs to talk to someone about her father medication carbidopa levodopa. She is worried about him running out of medication before we see him June

## 2018-07-08 DIAGNOSIS — I10 Essential (primary) hypertension: Secondary | ICD-10-CM | POA: Diagnosis not present

## 2018-07-08 DIAGNOSIS — Z87891 Personal history of nicotine dependence: Secondary | ICD-10-CM | POA: Diagnosis not present

## 2018-07-08 DIAGNOSIS — E119 Type 2 diabetes mellitus without complications: Secondary | ICD-10-CM | POA: Diagnosis not present

## 2018-07-08 DIAGNOSIS — E559 Vitamin D deficiency, unspecified: Secondary | ICD-10-CM | POA: Diagnosis not present

## 2018-07-08 DIAGNOSIS — I2510 Atherosclerotic heart disease of native coronary artery without angina pectoris: Secondary | ICD-10-CM | POA: Diagnosis not present

## 2018-07-13 ENCOUNTER — Other Ambulatory Visit: Payer: Self-pay | Admitting: Neurology

## 2018-07-13 NOTE — Telephone Encounter (Signed)
Please refill for 30 days and call patient and tell needs appointment.  Okay for evisit.

## 2018-08-11 NOTE — Progress Notes (Signed)
Virtual Visit via Video Note The purpose of this virtual visit is to provide medical care while limiting exposure to the novel coronavirus.    Consent was obtained for video visit:  Yes.   Answered questions that patient had about telehealth interaction:  Yes.   I discussed the limitations, risks, security and privacy concerns of performing an evaluation and management service by telemedicine. I also discussed with the patient that there may be a patient responsible charge related to this service. The patient expressed understanding and agreed to proceed.  Pt location: Home Physician Location: office Name of referring provider:  [name], MD I connected with [name] at patients initiation/request on 08/14/2018 at 10:30 AM EDT by video enabled telemedicine application and verified that I am speaking with the correct person using two identifiers. Pt MRN:  [id number] Pt DOB:  February 29, 1946 Video Participants:  [name];  Daughter [name]; wife, who supplement the history   History of Present Illness:  Patient is seen today in follow-up for Parkinson's disease.  He is on carbidopa/levodopa 25/100, 1 tablet 4 times per day and carbidopa/levodopa 50/200 CR at bedtime.  He finds that protein interferes with the medication and how long it lasts.  He had a fall in early feb.  He tripped over a tile in the house.  He hit his head and went to the ER.  I reviewed his CT and it was acutely unremarkable.  There was atrophy and small vessel disease.   Observations/Objective:   Vitals:   08/14/18 1001  BP: 130/72  Pulse: 75  Temp: 98.2 F (36.8 C)  Weight: 200 lb (90.7 kg)  Height: 6' (1.829 m)   GEN:  The patient appears stated age and is in NAD.  Neurological examination:  Orientation:  Memory: 1st trial - learned 3 words    2nd trial- learned 4 words  Attention:   List of digits 2/2  List of letters: 1/1  Serial 7's: 1/3  Language:  Repetition: 1/2  Fluency:  0/1  Abstraction: 2/2  Delayed recall: 3/5  Orientation: 6/6   TOTAL: 17/22 (point added to educ - 9th grade)  Cranial nerves: There is good facial symmetry. There is mild facial hypomimia.  The speech is fluent and clear. Soft palate rises symmetrically and there is no tongue deviation. Hearing is intact to conversational tone. Motor: Strength is at least antigravity x 4.   Shoulder shrug is equal and symmetric.  There is no pronator drift.  Movement examination: Tone: unable Abnormal movements: mild intermittent left upper extremity resting tremor is noted. Coordination:  There is  decremation with RAM's, with any form of RAMS, including alternating supination and pronation of the forearm, hand opening and closing, finger taps bilateral but the quality of the camera wasn't the best Gait and Station: The patient walks well in the hall    Assessment and Plan:   1.  Idiopathic Parkinson's disease.  The patient has tremor, bradykinesia, rigidity and mild postural instability.             -continue carbidopa/levodopa 25/100 qid.  Reminded him that he can take extra prn as he notices dose failure when he has a large protein meal.  Discussed the interaction of protein with carbidopa/levodopa.             -continue carbidopa/levodopa 50/200 cr q hs.    -Patient was concerned today as his bottles stated that carbidopa/levodopa was the generic for "Sinemet."  He thought he  did not tolerate that in the past.  We reviewed records and it was propranolol that he did not tolerate in the past.  Reassurance was provided.             -discussed exercise as related to PD.  Discussed safety.  Think okay to drive as he is right now (5 mile radius of home)             -Discussed online exercise programs.  2.  Hyperreflexia             -MRI brain unremarkable except for small vessel disease and known R carotid occlusion.  No neck pain  3.  R carotid occlusion             -nothing further to be done  in that regard  Follow Up Instructions:  Follow-up 5 months  -I discussed the assessment and treatment plan with the patient. The patient was provided an opportunity to ask questions and all were answered. The patient agreed with the plan and demonstrated an understanding of the instructions.   The patient was advised to call back or seek an in-person evaluation if the symptoms worsen or if the condition fails to improve as anticipated.    Total Time spent in visit with the patient was: 25 minutes, of which more than 50% of the time was spent in counseling and/or coordinating care on safety.   Pt understands and agrees with the plan of care outlined.     Alonza Bogus, DO

## 2018-08-13 ENCOUNTER — Ambulatory Visit: Payer: Medicare Other | Admitting: Psychology

## 2018-08-13 ENCOUNTER — Other Ambulatory Visit: Payer: Self-pay

## 2018-08-14 ENCOUNTER — Encounter: Payer: Self-pay | Admitting: Neurology

## 2018-08-14 ENCOUNTER — Telehealth (INDEPENDENT_AMBULATORY_CARE_PROVIDER_SITE_OTHER): Payer: Medicare Other | Admitting: Neurology

## 2018-08-14 DIAGNOSIS — G20A1 Parkinson's disease without dyskinesia, without mention of fluctuations: Secondary | ICD-10-CM

## 2018-08-14 DIAGNOSIS — G20 Parkinson's disease: Secondary | ICD-10-CM

## 2018-09-10 ENCOUNTER — Ambulatory Visit: Payer: Medicare Other | Admitting: Neurology

## 2018-09-22 ENCOUNTER — Other Ambulatory Visit: Payer: Self-pay | Admitting: Neurology

## 2018-09-22 ENCOUNTER — Ambulatory Visit (HOSPITAL_COMMUNITY)
Admission: RE | Admit: 2018-09-22 | Discharge: 2018-09-22 | Disposition: A | Discharge status: Home / Self Care | Payer: Medicare Other | Source: Ambulatory Visit | Attending: Cardiology | Admitting: Cardiology

## 2018-09-22 ENCOUNTER — Other Ambulatory Visit: Payer: Self-pay

## 2018-09-22 DIAGNOSIS — I6521 Occlusion and stenosis of right carotid artery: Secondary | ICD-10-CM

## 2018-09-22 NOTE — Telephone Encounter (Signed)
Requested Prescriptions   Pending Prescriptions Disp Refills  . carbidopa-levodopa (SINEMET IR) 25-100 MG tablet [Pharmacy Med Name: CARBIDOPA-LEVODOPA 25-100 TAB] 360 tablet 0    Sig: TAKE ONE TABLET BY MOUTH FOUR TIMES A DAY   Rx last filled:07/13/18 #360 0 refills  Pt last seen:08/14/18  Follow up appt scheduled:01/28/19

## 2018-09-24 ENCOUNTER — Other Ambulatory Visit: Payer: Self-pay | Admitting: *Deleted

## 2018-09-24 DIAGNOSIS — I6521 Occlusion and stenosis of right carotid artery: Secondary | ICD-10-CM

## 2018-09-29 ENCOUNTER — Ambulatory Visit: Payer: Medicare Other | Admitting: Neurology

## 2018-10-01 DIAGNOSIS — E559 Vitamin D deficiency, unspecified: Secondary | ICD-10-CM | POA: Diagnosis not present

## 2018-10-01 DIAGNOSIS — I779 Disorder of arteries and arterioles, unspecified: Secondary | ICD-10-CM | POA: Diagnosis not present

## 2018-10-01 DIAGNOSIS — E119 Type 2 diabetes mellitus without complications: Secondary | ICD-10-CM | POA: Diagnosis not present

## 2018-10-06 DIAGNOSIS — E785 Hyperlipidemia, unspecified: Secondary | ICD-10-CM | POA: Diagnosis not present

## 2018-10-07 DIAGNOSIS — E119 Type 2 diabetes mellitus without complications: Secondary | ICD-10-CM | POA: Diagnosis not present

## 2018-10-07 DIAGNOSIS — E118 Type 2 diabetes mellitus with unspecified complications: Secondary | ICD-10-CM | POA: Diagnosis not present

## 2018-10-07 DIAGNOSIS — I10 Essential (primary) hypertension: Secondary | ICD-10-CM | POA: Diagnosis not present

## 2018-10-07 DIAGNOSIS — E559 Vitamin D deficiency, unspecified: Secondary | ICD-10-CM | POA: Diagnosis not present

## 2018-10-07 DIAGNOSIS — I2510 Atherosclerotic heart disease of native coronary artery without angina pectoris: Secondary | ICD-10-CM | POA: Diagnosis not present

## 2018-10-08 ENCOUNTER — Other Ambulatory Visit: Payer: Self-pay | Admitting: Neurology

## 2018-10-08 DIAGNOSIS — L298 Other pruritus: Secondary | ICD-10-CM | POA: Diagnosis not present

## 2018-10-08 NOTE — Progress Notes (Signed)
Virtual Visit via Video Note The purpose of this virtual visit is to provide medical care while limiting exposure to the novel coronavirus.    Consent was obtained for video visit:  Yes.   Answered questions that patient had about telehealth interaction:  Yes.   I discussed the limitations, risks, security and privacy concerns of performing an evaluation and management service by telemedicine. I also discussed with the patient that there may be a patient responsible charge related to this service. The patient expressed understanding and agreed to proceed.  Pt location: Home Physician Location: home Name of referring provider:  [name], MD I connected with Rodney Love at patients initiation/request on 10/09/2018 at  2:30 PM EDT by video enabled telemedicine application and verified that I am speaking with the correct person using two identifiers. Pt MRN:  [id number] Pt DOB:  1945/08/31 Video Participants:  Rodney Love;  Daughter supplements hx   History of Present Illness:  Patient seen today in follow-up for Parkinson's disease.  He is on carbidopa/levodopa 25/100, 1 tablet 4 times per day, and I told him he could take an extra as needed.  He is also on carbidopa/levodopa 50/200 at bedtime.  Just a few days ago, I got an email from the patient's daughter stating that his daytime medications seem to be working okay, but he wakes up around 2 or 3 AM and feels nervous and jittery and has restless leg and feels like his medicines are wearing off.  He cannot tell if he wakes up because he has to urinate or if it is because he is tremulous, but both things are happening.  Patient recently saw his endocrinologist.  His A1c was just slightly elevated at 6.4 and his metformin was restarted, 850 mg daily.  His vitamin D was low and they increased that.   Current Outpatient Medications on File Prior to Visit  Medication Sig Dispense Refill  . aspirin EC 81 MG tablet Take 81 mg by mouth daily.     Marland Kitchen atorvastatin (LIPITOR) 20 MG tablet Take 20 mg by mouth daily.     . carbidopa-levodopa (SINEMET CR) 50-200 MG tablet Take 1 tablet by mouth at bedtime. 90 tablet 0  . carbidopa-levodopa (SINEMET IR) 25-100 MG tablet TAKE ONE TABLET BY MOUTH FOUR TIMES A DAY 360 tablet 0  . Cholecalciferol (VITAMIN D) 50 MCG (2000 UT) CAPS Take 1 capsule by mouth daily.    . metFORMIN (GLUCOPHAGE) 850 MG tablet Take 850 mg by mouth daily with breakfast.    . tamsulosin (FLOMAX) 0.4 MG CAPS capsule Take 1 capsule by mouth every evening.      No current facility-administered medications on file prior to visit.      Observations/Objective:   Vitals:   10/09/18 1400  Weight: 195 lb (88.5 kg)  Height: 6' (1.829 m)   GEN:  The patient appears stated age and is in NAD.  Neurological examination:  Orientation: The patient is alert and oriented x3. Cranial nerves: There is good facial symmetry. There is mild facial hypomimia.  The speech is fluent and clear. Soft palate rises symmetrically and there is no tongue deviation. Hearing is intact to conversational tone. Motor: Strength is at least antigravity x 4.   Shoulder shrug is equal and symmetric.  There is no pronator drift.  Movement examination: Tone: unable Abnormal movements: none Coordination:  There is no decremation with RAM's, with hand opening and closing or finger taps bilaterally. Gait and Station: The  patient pushes off of the kitchen table. The patient's stride length is good.  No trouble in the turn and he is walking in a small hallway    Assessment and Plan:   1.  Parkinson's disease  -Continue carbidopa/levodopa 25/100, 1 tablet 4 times per day.  Told him to take an extra half tablet to 1 tablet when he wakes up in the middle of the night and feels tremulous and with restless leg.  He is actually tried that on one occasion and he found it worked.  His daughter will email me in 2 weeks and let me know how he is doing.  If that does  not help, we can consider clonazepam at bedtime, but I would like to avoid trying to add more medication.  -Continue carbidopa/levodopa 50/200 CR at bedtime.  2.  Hyperreflexia  -MRI brain unremarkable with exception of known small vessel disease and known right carotid occlusion.  No neck pain.  3.  Right carotid occlusion  -No further treatment necessary  Follow Up Instructions:  Patient has an October appointment and he will keep that.   -I discussed the assessment and treatment plan with the patient. The patient was provided an opportunity to ask questions and all were answered. The patient agreed with the plan and demonstrated an understanding of the instructions.   The patient was advised to call back or seek an in-person evaluation if the symptoms worsen or if the condition fails to improve as anticipated.    Total Time spent in visit with the patient was:  15 min, of which more than 50% of the time was spent in counseling and/or coordinating care as above.   Pt understands and agrees with the plan of care outlined.     Alonza Bogus, DO

## 2018-10-09 ENCOUNTER — Telehealth (INDEPENDENT_AMBULATORY_CARE_PROVIDER_SITE_OTHER): Payer: Medicare Other | Admitting: Neurology

## 2018-10-09 ENCOUNTER — Encounter: Payer: Self-pay | Admitting: Neurology

## 2018-10-09 ENCOUNTER — Other Ambulatory Visit: Payer: Self-pay

## 2018-10-09 VITALS — Ht 72.0 in | Wt 195.0 lb

## 2018-10-09 DIAGNOSIS — G20 Parkinson's disease: Secondary | ICD-10-CM | POA: Diagnosis not present

## 2018-10-09 DIAGNOSIS — G2581 Restless legs syndrome: Secondary | ICD-10-CM | POA: Diagnosis not present

## 2018-10-13 NOTE — Telephone Encounter (Signed)
Requested Prescriptions   Pending Prescriptions Disp Refills  . carbidopa-levodopa (SINEMET IR) 25-100 MG tablet [Pharmacy Med Name: CARBIDOPA-LEVODOPA 25-100 TAB] 360 tablet 0    Sig: TAKE ONE TABLET BY MOUTH FOUR TIMES A DAY   Rx last filled:09/22/18 #360 0 REFILLS  Pt last seen:10/09/18  Follow up appt scheduled:01/28/19

## 2018-10-30 NOTE — Progress Notes (Signed)
[place] TEST PERFORMED BY DANA CHAMERLAIN

## 2018-11-18 DIAGNOSIS — I2510 Atherosclerotic heart disease of native coronary artery without angina pectoris: Secondary | ICD-10-CM | POA: Diagnosis not present

## 2018-11-18 DIAGNOSIS — I10 Essential (primary) hypertension: Secondary | ICD-10-CM | POA: Diagnosis not present

## 2018-11-18 DIAGNOSIS — E118 Type 2 diabetes mellitus with unspecified complications: Secondary | ICD-10-CM | POA: Diagnosis not present

## 2018-11-18 DIAGNOSIS — E559 Vitamin D deficiency, unspecified: Secondary | ICD-10-CM | POA: Diagnosis not present

## 2019-01-06 ENCOUNTER — Encounter: Payer: Self-pay | Admitting: Cardiovascular Disease

## 2019-01-06 ENCOUNTER — Telehealth (INDEPENDENT_AMBULATORY_CARE_PROVIDER_SITE_OTHER): Payer: Medicare Other | Admitting: Cardiovascular Disease

## 2019-01-06 ENCOUNTER — Telehealth: Payer: Self-pay | Admitting: *Deleted

## 2019-01-06 DIAGNOSIS — E7800 Pure hypercholesterolemia, unspecified: Secondary | ICD-10-CM

## 2019-01-06 DIAGNOSIS — I2510 Atherosclerotic heart disease of native coronary artery without angina pectoris: Secondary | ICD-10-CM

## 2019-01-06 DIAGNOSIS — I739 Peripheral vascular disease, unspecified: Secondary | ICD-10-CM

## 2019-01-06 DIAGNOSIS — I6521 Occlusion and stenosis of right carotid artery: Secondary | ICD-10-CM

## 2019-01-06 DIAGNOSIS — I10 Essential (primary) hypertension: Secondary | ICD-10-CM

## 2019-01-06 DIAGNOSIS — E119 Type 2 diabetes mellitus without complications: Secondary | ICD-10-CM | POA: Diagnosis not present

## 2019-01-06 NOTE — Patient Instructions (Signed)
Medication Instructions:  Your physician recommends that you continue on your current medications as directed. Please refer to the Current Medication list given to you today.  If you need a refill on your cardiac medications before your next appointment, please call your pharmacy.   Lab work: None ordered If you have labs (blood work) drawn today and your tests are completely normal, you will receive your results only by: [place] (if you have MyChart) OR A paper copy in the mail If you have any lab test that is abnormal or we need to change your treatment, we will call you to review the results.  Testing/Procedures: Aorta/iliac and Carotid in June of 2021. The office will call to schedule them.  Follow-Up: At [facility], you and your health needs are our priority.  As part of our continuing mission to provide you with exceptional heart care, we have created designated Provider Care Teams.  These Care Teams include your primary Cardiologist (physician) and Advanced Practice Providers (APPs -  Physician Assistants and Nurse Practitioners) who all work together to provide you with the care you need, when you need it. You will need a follow up appointment in 12 months.  Please call our office 2 months in advance to schedule this appointment.  You may see Sanda Klein, MD or one of the following Advanced Practice Providers on your designated Care Team: Almyra Deforest, PA-C Fabian Sharp, [place]

## 2019-01-06 NOTE — Telephone Encounter (Signed)
Virtual Visit Pre-Appointment Phone Call  "(Name), I am calling you today to discuss your upcoming appointment. We are currently trying to limit exposure to the virus that causes COVID-19 by seeing patients at home rather than in the office."  1. "What is the BEST phone number to call the day of the visit?" - include this in appointment notes  2. "Do you have or have access to (through a family member/friend) a smartphone with video capability that we can use for your visit?" a. If yes - list this number in appt notes as "cell" (if different from BEST phone #) and list the appointment type as a VIDEO visit in appointment notes b. If no - list the appointment type as a PHONE visit in appointment notes  3. Confirm consent - "In the setting of the current Covid19 crisis, you are scheduled for a (phone or video) visit with your provider on (date) at (time).  Just as we do with many in-office visits, in order for you to participate in this visit, we must obtain consent.  If you'd like, I can send this to your mychart (if signed up) or email for you to review.  Otherwise, I can obtain your verbal consent now.  All virtual visits are billed to your insurance company just like a normal visit would be.  By agreeing to a virtual visit, we'd like you to understand that the technology does not allow for your provider to perform an examination, and thus may limit your provider's ability to fully assess your condition. If your provider identifies any concerns that need to be evaluated in person, we will make arrangements to do so.  Finally, though the technology is pretty good, we cannot assure that it will always work on either your or our end, and in the setting of a video visit, we may have to convert it to a phone-only visit.  In either situation, we cannot ensure that we have a secure connection.  Are you willing to proceed? YES  4. Advise patient to be prepared - "Two hours prior to your appointment, go  ahead and check your blood pressure, pulse, oxygen saturation, and your weight (if you have the equipment to check those) and write them all down. When your visit starts, your provider will ask you for this information. If you have an Apple Watch or Kardia device, please plan to have heart rate information ready on the day of your appointment. Please have a pen and paper handy nearby the day of the visit as well."  5. Give patient instructions for MyChart download to smartphone OR Doximity/Doxy.me as below if video visit (depending on what platform provider is using)  6. Inform patient they will receive a phone call 15 minutes prior to their appointment time (may be from unknown caller ID) so they should be prepared to answer    TELEPHONE CALL NOTE  Rodney Love has been deemed a candidate for a follow-up tele-health visit to limit community exposure during the Covid-19 pandemic. I spoke with the patient via phone to ensure availability of phone/video source, confirm preferred email & phone number, and discuss instructions and expectations.  I reminded Rodney Love to be prepared with any vital sign and/or heart rhythm information that could potentially be obtained via home monitoring, at the time of his visit. I reminded Rodney Love to expect a phone call prior to his visit.  Ricci Barker, RN 01/06/2019 10:07 AM   INSTRUCTIONS  FOR DOWNLOADING THE MYCHART APP TO SMARTPHONE  - The patient must first make sure to have activated MyChart and know their login information - If Apple, go to [employer] and type in MyChart in the search bar and download the app. If Android, ask patient to go to [employer] and type in [place] in the search bar and download the app. The app is free but as with any other app downloads, their phone may require them to verify saved payment information or Apple/Android password.  - The patient will need to then log into the app with their MyChart username and  password, and select [facility] as their healthcare provider to link the account. When it is time for your visit, go to the MyChart app, find appointments, and click Begin Video Visit. Be sure to Select Allow for your device to access the Microphone and Camera for your visit. You will then be connected, and your provider will be with you shortly.  **If they have any issues connecting, or need assistance please contact MyChart service desk [phone])**  **If using a computer, in order to ensure the best quality for their visit they will need to use either of the following Internet Browsers: [employer], or Google Chrome**  IF USING DOXIMITY or DOXY.ME - The patient will receive a link just prior to their visit by text.     FULL LENGTH CONSENT FOR TELE-HEALTH VISIT   I hereby voluntarily request, consent and authorize [facility] and its employed or contracted physicians, physician assistants, nurse practitioners or other licensed health care professionals (the Practitioner), to provide me with telemedicine health care services (the "Services") as deemed necessary by the treating Practitioner. I acknowledge and consent to receive the Services by the Practitioner via telemedicine. I understand that the telemedicine visit will involve communicating with the Practitioner through live audiovisual communication technology and the disclosure of certain medical information by electronic transmission. I acknowledge that I have been given the opportunity to request an in-person assessment or other available alternative prior to the telemedicine visit and am voluntarily participating in the telemedicine visit.  I understand that I have the right to withhold or withdraw my consent to the use of telemedicine in the course of my care at any time, without affecting my right to future care or treatment, and that the Practitioner or I may terminate the telemedicine visit at any time. I  understand that I have the right to inspect all information obtained and/or recorded in the course of the telemedicine visit and may receive copies of available information for a reasonable fee.  I understand that some of the potential risks of receiving the Services via telemedicine include:  Marland Kitchen Delay or interruption in medical evaluation due to technological equipment failure or disruption; . Information transmitted may not be sufficient (e.g. poor resolution of images) to allow for appropriate medical decision making by the Practitioner; and/or  . In rare instances, security protocols could fail, causing a breach of personal health information.  Furthermore, I acknowledge that it is my responsibility to provide information about my medical history, conditions and care that is complete and accurate to the best of my ability. I acknowledge that Practitioner's advice, recommendations, and/or decision may be based on factors not within their control, such as incomplete or inaccurate data provided by me or distortions of diagnostic images or specimens that may result from electronic transmissions. I understand that the practice of medicine is not an exact science and  that Practitioner makes no warranties or guarantees regarding treatment outcomes. I acknowledge that I will receive a copy of this consent concurrently upon execution via email to the email address I last provided but may also request a printed copy by calling the office of [facility].    I understand that my insurance will be billed for this visit.   I have read or had this consent read to me. . I understand the contents of this consent, which adequately explains the benefits and risks of the Services being provided via telemedicine.  . I have been provided ample opportunity to ask questions regarding this consent and the Services and have had my questions answered to my satisfaction. . I give my informed consent for the services to be  provided through the use of telemedicine in my medical care  By participating in this telemedicine visit I agree to the above.

## 2019-01-06 NOTE — Progress Notes (Signed)
Virtual Visit via Telephone Note   This visit type was conducted due to national recommendations for restrictions regarding the COVID-19 Pandemic (e.g. social distancing) in an effort to limit this patient's exposure and mitigate transmission in our community.  Due to his co-morbid illnesses, this patient is at least at moderate risk for complications without adequate follow up.  This format is felt to be most appropriate for this patient at this time.  The patient did not have access to video technology/had technical difficulties with video requiring transitioning to audio format only (telephone).  All issues noted in this document were discussed and addressed.  No physical exam could be performed with this format.  Please refer to the patient's chart for his  consent to telehealth for [facility].   Date:  01/06/2019   ID:  Rodney Love, DOB 1945/07/17, MRN [id number]  Patient Location: Home Provider Location: Home  PCP:  [name], MD  Cardiologist:  Iyah Laguna Electrophysiologist:  None   Evaluation Performed:  Follow-Up Visit  Chief Complaint:  PAD/Carotid disease  History of Present Illness:    Rodney Love is a 73 y.o. male with essential hypertension, diabetes mellitus, dyslipidemia and widespread atherosclerosis (occlusion of the right carotid, bilateral iliac stenosis, nonobstructive coronary artery disease).  The patient specifically denies any chest pain at rest exertion, dyspnea at rest or with exertion, orthopnea, paroxysmal nocturnal dyspnea, syncope, palpitations, focal neurological deficits, intermittent claudication, lower extremity edema, unexplained weight gain, cough, hemoptysis or wheezing.  Over the last couple of years his health complaints have been dominated by limitations related to Parkinson's disease.  This has drastically reduced his physical activity.  Some days are better than others.  Sometimes has difficulty getting out of a chair.  He has had 2  falls, 1 of them with head impact leading to an emergency room evaluation in February of this year.  His metabolic parameters have been very well controlled with a recent hemoglobin A1c of 6.4% and LDL of 51.  The patient does not have symptoms concerning for COVID-19 infection (fever, chills, cough, or new shortness of breath).    Past Medical History:  Diagnosis Date  . Arthritis   . Constipation    OCCASIONAL  . Coronary artery disease   . Diabetes mellitus without complication ([place])   . Environmental allergies   . Hyperlipidemia   . Hypertension   . Inguinal hernia   . Nocturia   . Peripheral arterial disease [facility])    Past Surgical History:  Procedure Laterality Date  . BACK SURGERY    . CARDIAC CATHETERIZATION  05/29/2006   single vessel CAD w/moderate stenosis of the prox. RCA approx 40%,prox. LAD 20-30%,minor irreg. mid abdominal aorta,left iliac artery disease 50-60%  . CATARACT EXTRACTION Left 12/2016  . HERNIA REPAIR  [id number]  . INGUINAL HERNIA REPAIR N/A 09/23/2012   Procedure: LAPAROSCOPIC INGUINAL HERNIA with umbilical hernia;  Surgeon: Madilyn Hook, DO;  Location: [facility] ORS;  Service: General;  Laterality: N/A;  . INSERTION OF MESH Bilateral 09/23/2012   Procedure: INSERTION OF MESH;  Surgeon: Madilyn Hook, DO;  Location: [facility];  Service: General;  Laterality: Bilateral;     Current Meds  Medication Sig  . aspirin EC 81 MG tablet Take 81 mg by mouth daily.  Marland Kitchen atorvastatin (LIPITOR) 20 MG tablet Take 20 mg by mouth daily.   . carbidopa-levodopa (SINEMET CR) 50-200 MG tablet Take 1 tablet by mouth at bedtime.  . carbidopa-levodopa (SINEMET IR) 25-100 MG tablet  TAKE ONE TABLET BY MOUTH FOUR TIMES A DAY  . Cholecalciferol (VITAMIN D) 50 MCG (2000 UT) CAPS Take 1 capsule by mouth daily.  . metFORMIN (GLUCOPHAGE) 850 MG tablet Take 850 mg by mouth daily with breakfast.  . tamsulosin (FLOMAX) 0.4 MG CAPS capsule Take 1 capsule by mouth every evening.      Allergies:    Patient has no known allergies.   Social History   Tobacco Use  . Smoking status: Former Smoker    Quit date: 04/02/1991    Years since quitting: 27.7  . Smokeless tobacco: Former Systems developer    Quit date: 08/06/1991  Substance Use Topics  . Alcohol use: No  . Drug use: No     Family Hx: The patient's family history includes Cancer in his sister and sister; Diabetes in his mother and sister; Healthy in his daughter, daughter, and son; Kidney disease in his mother; Stroke in his brother and father.  ROS:   Please see the history of present illness.     All other systems reviewed and are negative.   Prior CV studies:   The following studies were reviewed today:  09/22/2018 carotid ultrasound with 100% occlusion right carotid, mild plaque left carotid 06/22/2018 aortoiliac ultrasound with greater than 50% right external iliac stenosis, greater than 50% left external iliac stenosis site of previous angioplasty, but acceptable ABI right 0.92 (0.98 previously), left 0.80 (0.84 previously).  Labs/Other Tests and Data Reviewed:    EKG:  An ECG dated 09/10/2017 was personally reviewed today and demonstrated:  Normal sinus rhythm, normal tracing  Recent Labs: July 2020 hemoglobin A1c 6.4%, creatinine 1.05, normal electrolytes and liver function tests Recent Lipid Panel July 2020 total cholesterol 100, HDL 35, LDL 51, triglycerides 70  Wt Readings from Last 3 Encounters:  01/06/19 187 lb 14.4 oz (85.2 kg)  10/09/18 195 lb (88.5 kg)  08/14/18 200 lb (90.7 kg)     Objective:    Vital Signs:  BP 127/73   Pulse 63   Ht 6' (1.829 m)   Wt 187 lb 14.4 oz (85.2 kg)   BMI 25.48 kg/m    VITAL SIGNS:  reviewed Unable to examine  ASSESSMENT & PLAN:    1. PAD: Asymptomatic and normal ABIs.  Claudication may be masked by sedentarism, related to neurological disorder. 2. 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  No focal neurological  symptoms. 3. CAD: He does not have angina pectoris. 4. HLP: Low HDL is unlikely to improve since he cannot be physically active.  He has actually lost some weight and is very close to optimal weight.  Excellent LDL. 5. DM: Good glycemic control.  On metformin monotherapy 6. HTN: With weight loss, his blood pressure has normalized.  He does not have symptoms of orthostatic hypotension.  He is no longer taking any blood pressure lowering medications.   COVID-19 Education: The signs and symptoms of COVID-19 were discussed with the patient and how to seek care for testing (follow up with PCP or arrange E-visit).  The importance of social distancing was discussed today.  Time:   Today, I have spent 17 minutes with the patient with telehealth technology discussing the above problems.     Medication Adjustments/Labs and Tests Ordered: Current medicines are reviewed at length with the patient today.  Concerns regarding medicines are outlined above.   Tests Ordered: No orders of the defined types were placed in this encounter.  Medication Changes: No orders of the defined types were placed in this encounter.   Follow Up:  In Person 1 year  Signed, Sanda Klein, MD  01/06/2019 11:09 AM    [place]

## 2019-01-07 ENCOUNTER — Ambulatory Visit: Payer: Medicare Other

## 2019-01-07 ENCOUNTER — Ambulatory Visit: Payer: Medicare Other | Admitting: Physical Therapy

## 2019-01-07 ENCOUNTER — Ambulatory Visit: Payer: Medicare Other | Admitting: Occupational Therapy

## 2019-01-10 ENCOUNTER — Telehealth: Payer: Medicare Other | Admitting: Family

## 2019-01-10 DIAGNOSIS — T887XXA Unspecified adverse effect of drug or medicament, initial encounter: Secondary | ICD-10-CM

## 2019-01-10 NOTE — Progress Notes (Signed)
E Visit for Motion Sickness  We are sorry that you are not feeling well. Here is how we plan to help!  . I recommend you to stop the the carbidopa-levodopa and call your PCP in the morning and let them know the current side effects you are experiencing.    GET HELP RIGHT AWAY IF:   Your symptoms do not improve or worsen within 2 days after treatment.   You cannot keep down fluids after trying the medication.   Other associated symptoms such as severe headache, visual field changes, fever, or intractable nausea and vomiting.  MAKE SURE YOU:   Understand these instructions.  Will watch your condition.  Will get help right away if you are not doing well or get worse.  Thank you for choosing an e-visit.  Approximately 5 minutes was spent documenting and reviewing patient's chart.     Your e-visit answers were reviewed by a board certified advanced clinical practitioner to complete your personal care plan. Depending upon the condition, your plan could have included both over the counter or prescription medications.  Please review your pharmacy choice. Be sure that the pharmacy you have chosen is open so that you can pick up your prescription now.  If there is a problem you may message your provider in [place] to have the prescription routed to another pharmacy.  Your safety is important to [place]. If you have drug allergies check your prescription carefully.   For the next 24 hours, you can use MyChart to ask questions about today's visit, request a non-urgent call back, or ask for a work or school excuse from your e-visit provider.  You will get an e-mail in the next two days asking about your experience. I hope that your e-visit has been valuable and will speed your recovery.   References or for more  information: ThenWeb.com.ee [email or web address].[email or web address] [email or web address].uptodate.com

## 2019-01-26 NOTE — Progress Notes (Signed)
Rodney Love was seen today in follow up for Parkinsons disease.   Pt denies falls.  I have received many emails from the patient/daughter since our last visit and have addressed each of these.  Most recently, the patient had an E- visit with a nurse practitioner on October 11 for what is described in the chart as "motion sickness."  He was told to discontinue his levodopa.  He did email me the following day and stated that he was having shaking for 5 minutes after he took his levodopa and it would last for about an hour and a half.  I told him that did not make physiologic sense, as the medication would not even be absorbed fast.  I told him absolutely not to stop his medication.  His daughter emailed me just a few hours ago about the fact that last night he took his bedtime dose of levodopa at 10 PM and went to bed and by 1030, his arm and leg was shaking, so he took another levodopa.  He got up at 1:30 AM and took another levodopa.  She also mentioned in the email that the patient fell on October 18.  He was coming down the stairs from the driveway, turned around to close again and when he turned back he stepped on a corner and lost his balance.  No hallucinations.  No lightheadedness.    Current prescribed movement disorder medications: Carbidopa/levodopa 25/100, 1 tablet 4-5 times per day Carbidopa/levodopa 50/200 at bedtime   ALLERGIES:  No Known Allergies  CURRENT MEDICATIONS:  Outpatient Encounter Medications as of 01/28/2019  Medication Sig  . aspirin EC 81 MG tablet Take 81 mg by mouth daily.  Marland Kitchen atorvastatin (LIPITOR) 20 MG tablet Take 20 mg by mouth daily.   . carbidopa-levodopa (SINEMET CR) 50-200 MG tablet Take 1 tablet by mouth at bedtime.  . carbidopa-levodopa (SINEMET IR) 25-100 MG tablet TAKE ONE TABLET BY MOUTH FOUR TIMES A DAY  . Cholecalciferol (VITAMIN D) 50 MCG (2000 UT) CAPS Take 1 capsule by mouth daily.  . metFORMIN (GLUCOPHAGE) 850 MG tablet Take 850 mg by mouth  daily with breakfast.  . tamsulosin (FLOMAX) 0.4 MG CAPS capsule Take 1 capsule by mouth every evening.    No facility-administered encounter medications on file as of 01/28/2019.     PAST MEDICAL HISTORY:   Past Medical History:  Diagnosis Date  . Arthritis   . Constipation    OCCASIONAL  . Coronary artery disease   . Diabetes mellitus without complication ([place])   . Environmental allergies   . Hyperlipidemia   . Hypertension   . Inguinal hernia   . Nocturia   . Peripheral arterial disease ([place])     PAST SURGICAL HISTORY:   Past Surgical History:  Procedure Laterality Date  . BACK SURGERY    . CARDIAC CATHETERIZATION  05/29/2006   single vessel CAD w/moderate stenosis of the prox. RCA approx 40%,prox. LAD 20-30%,minor irreg. mid abdominal aorta,left iliac artery disease 50-60%  . CATARACT EXTRACTION Left 12/2016  . HERNIA REPAIR  [id number]  . INGUINAL HERNIA REPAIR N/A 09/23/2012   Procedure: LAPAROSCOPIC INGUINAL HERNIA with umbilical hernia;  Surgeon: Madilyn Hook, DO;  Location: [facility];  Service: General;  Laterality: N/A;  . INSERTION OF MESH Bilateral 09/23/2012   Procedure: INSERTION OF MESH;  Surgeon: Madilyn Hook, DO;  Location: [facility];  Service: General;  Laterality: Bilateral;    SOCIAL HISTORY:   Social History   Socioeconomic History  .  Marital status: Married    Spouse name: [name]  . Number of children: 3  . Years of education: 9  . Highest education level: 9th grade  Occupational History  . Occupation: retired    Comment: Barista  . Financial resource strain: Not on file  . Food insecurity    Worry: Not on file    Inability: Not on file  . Transportation needs    Medical: Not on file    Non-medical: Not on file  Tobacco Use  . Smoking status: Former Smoker    Quit date: 04/02/1991    Years since quitting: 27.8  . Smokeless tobacco: Former Systems developer    Quit date: 08/06/1991  Substance and Sexual Activity  . Alcohol use: No  . Drug  use: No  . Sexual activity: Not on file  Lifestyle  . Physical activity    Days per week: Not on file    Minutes per session: Not on file  . Stress: Not on file  Relationships  . Social Herbalist on phone: Not on file    Gets together: Not on file    Attends religious service: Not on file    Active member of club or organization: Not on file    Attends meetings of clubs or organizations: Not on file    Relationship status: Not on file  . Intimate partner violence    Fear of current or ex partner: Not on file    Emotionally abused: Not on file    Physically abused: Not on file    Forced sexual activity: Not on file  Other Topics Concern  . Not on file  Social History Narrative   Patient is right-handed. He lives with his wife [name] in a one level home. He walks daily.    FAMILY HISTORY:   Family Status  Relation Name Status  . Mother  Deceased  . Father  Deceased  . Brother  Deceased  . Sister  Deceased  . Sister  Deceased  . Sister  Deceased  . Daughter  Alive  . Daughter  Alive  . Son  Alive    ROS:  Review of Systems  Constitutional: Negative.   HENT: Negative.   Eyes: Negative.   Respiratory: Negative.   Cardiovascular: Negative.   Gastrointestinal: Negative.   Genitourinary: Negative.   Skin: Negative.   Neurological: Positive for tremors.  Psychiatric/Behavioral: The patient is nervous/anxious.     PHYSICAL EXAMINATION:    VITALS:   Vitals:   01/28/19 1520  BP: (!) 149/74  Pulse: 84  Resp: 16  Temp: 97.9 F (36.6 C)  SpO2: 97%  Weight: 187 lb 9.6 oz (85.1 kg)  Height: 6' (1.829 m)   Examined at 3pm and last took carbidopa/levodopa at 10am.  Missed 2 pm dosage  GEN:  The patient appears stated age and is in NAD. HEENT:  Normocephalic, atraumatic.  The mucous membranes are moist. The superficial temporal arteries are without ropiness or tenderness. CV:  RRR Lungs:  CTAB Neck/HEME:  There are no carotid bruits bilaterally.   Neurological examination:  Orientation: The patient is alert and oriented x3. Cranial nerves: There is good facial symmetry with facial hypomimia. The speech is fluent and clear. Soft palate rises symmetrically and there is no tongue deviation. Hearing is intact to conversational tone. Sensation: Sensation is intact to light touch throughout Motor: Strength is at least antigravity x4.  Movement examination: Tone: There is mod increased tone  in the LUE Abnormal movements: mild intermittent LUE rest tremor Coordination:  There is  decremation with RAM's, with any form of RAMS, including alternating supination and pronation of the forearm, hand opening and closing, finger taps, heel taps and toe taps on the left Gait and Station: The patient has no difficulty arising out of a deep-seated chair without the use of the hands. The patient's stride length is decreased.     ASSESSMENT/PLAN:  1.  Parkinson's disease             -Continue carbidopa/levodopa 25/100, 1 tablet 4-5 times per day.    He was underdosed today, but he had missed his 2 PM dose.             -Continue carbidopa/levodopa 50/200 CR at bedtime.  -Discussed importance of safe, cardiovascular exercise.  Social worker was offered to him.  She is doing some online exercise classes as well.  2.  Hyperreflexia             -MRI brain unremarkable with exception of known small vessel disease and known right carotid occlusion.  No neck pain.  3.  Right carotid occlusion             -No further treatment necessary  4.  ?anxiety  -I think that the patient is anxious and maybe even has some depression over motor fluctuations.  He and I talked about this today.  After our discussion we decided to add Lexapro, 10 mg daily.  We discussed risk, benefits, and side effects.  He will let me know if he has any issues with the medication.  5.  Much greater than 50% of this visit was spent in counseling and coordinating care.  Total face to face  time:  25 min  Cc:  [name], MD

## 2019-01-28 ENCOUNTER — Ambulatory Visit (INDEPENDENT_AMBULATORY_CARE_PROVIDER_SITE_OTHER): Payer: Medicare Other | Admitting: Neurology

## 2019-01-28 ENCOUNTER — Other Ambulatory Visit: Payer: Self-pay

## 2019-01-28 ENCOUNTER — Encounter: Payer: Self-pay | Admitting: Neurology

## 2019-01-28 VITALS — BP 149/74 | HR 84 | Temp 97.9°F | Resp 16 | Ht 72.0 in | Wt 187.6 lb

## 2019-01-28 DIAGNOSIS — F411 Generalized anxiety disorder: Secondary | ICD-10-CM | POA: Diagnosis not present

## 2019-01-28 DIAGNOSIS — G20 Parkinson's disease: Secondary | ICD-10-CM

## 2019-01-28 MED ORDER — CARBIDOPA-LEVODOPA 25-100 MG PO TABS: 1.0000 | ORAL_TABLET | Freq: Every day | ORAL | 1 refills | Status: DC

## 2019-01-28 MED ORDER — CARBIDOPA-LEVODOPA ER 50-200 MG PO TBCR: 1.0000 | EXTENDED_RELEASE_TABLET | Freq: Every day | ORAL | 1 refills | Status: DC

## 2019-01-28 MED ORDER — ESCITALOPRAM OXALATE 10 MG PO TABS: 10.0000 mg | ORAL_TABLET | Freq: Every day | ORAL | 1 refills | Status: DC

## 2019-01-28 NOTE — Patient Instructions (Signed)
1.  No changes to your carbidopa/levodopa 25/100.  You look a little underdosed today but that is because you missed your 2pm dosage 2.  We are going to start lexapro, 10 mg daily.  You can take this at any time of day.  It may take 4-6 weeks to help.  Most parkinsons patients are on some medication for anxiety/mood.  Give this some time to work. 3.  Continue carbidopa/levodopa 50/200 at bedtime 4.  Join [place] for online support group and online rock steady boxing.  I stapled my social workers card, Judson Roch, and she can connect you to these groups.  The physicians and staff at [facility] are committed to providing excellent care. You may receive a survey requesting feedback about your experience at our office. We strive to receive "very good" responses to the survey questions. If you feel that your experience would prevent you from giving the office a "very good " response, please contact our office to try to remedy the situation. We may be reached at [phone]. Thank you for taking the time out of your busy day to complete the survey.

## 2019-02-19 ENCOUNTER — Ambulatory Visit (INDEPENDENT_AMBULATORY_CARE_PROVIDER_SITE_OTHER): Payer: Medicare Other | Admitting: Family Medicine

## 2019-02-19 ENCOUNTER — Ambulatory Visit: Payer: Self-pay

## 2019-02-19 ENCOUNTER — Encounter: Payer: Self-pay | Admitting: Family Medicine

## 2019-02-19 ENCOUNTER — Other Ambulatory Visit: Payer: Self-pay

## 2019-02-19 DIAGNOSIS — M545 Low back pain, unspecified: Secondary | ICD-10-CM

## 2019-02-19 NOTE — Progress Notes (Signed)
Office Visit Note   Patient: Rodney Love           Date of Birth: December 17, 1945           MRN: [id number] Visit Date: 02/19/2019 Requested by: [name], MD [address] [place],  [place] [address] PCP: [name], MD  Subjective: Chief Complaint  Patient presents with  . Lower Back - Pain    DOI 02/18/19 stood up from the table and his foot got tangled around the chair leg. [place], hitting right elbow/upper arm on the floor. Shoulder is better today, but continues to have pain right lower back. No radiating pain down the leg.  . Right Shoulder - Pain    HPI: He is here with low back pain.  Yesterday he stood up from the chair at the table, his foot got tangled around the chair leg and he fell to the right landing on his arm.  Pain in his elbow and shoulder initially, but that feels much better today.  He continues to have some pain in the low back toward the right side without radicular symptoms.  He has a history of Parkinson's which makes him less coordinated than he used to be.  He has a history of lumbar discectomy and fusion at L5-S1 in 2005.  No history of fractures.              ROS: Denies fevers or chills, denies bowel or bladder dysfunction.  All other systems were reviewed and are negative.  Objective: Vital Signs: There were no vitals taken for this visit.  Physical Exam:  General:  Alert and oriented, in no acute distress. Pulm:  Breathing unlabored. Psy:  Normal mood, congruent affect. Skin: No bruising seen. Right shoulder: Full range of motion, 5/5 rotator cuff strength pain-free. Right elbow: Slightly tender over the olecranon but full range of motion and no pain with resisted strength testing.  No effusion. Low back: He has some tenderness in the upper lumbar spinous processes and at the lower.  He also has some paraspinous tenderness to the right of midline.  No pain in the sciatic notch, negative straight leg raise, lower extremity strength  and reflexes are normal.  Imaging: X-rays lumbar spine: He has substantial degenerative changes in his spine, but no definite fracture.  There is calcification in the abdominal aorta.    Assessment & Plan: 1.  1 day status post fall with resolving right elbow and shoulder contusions, and with low back pain, probably aggravation of underlying spondylosis. -He did not want any medications.  He will try ice or heat and use whichever is comfortable.  If pain does not improve, he will call for a physical therapy referral.     Procedures: No procedures performed  No notes on file     PMFS History: Patient Active Problem List   Diagnosis Date Noted  . PD (Parkinson's disease) ([place]) 12/18/2015  . Tremor of both hands 11/22/2015  . PAD (peripheral artery disease) s/p remote left external iliac artery angioplasty 10/28/2012  . Coronary atherosclerosis 10/28/2012  . Hypercholesterolemia 10/28/2012  . Essential hypertension 10/28/2012  . Diabetes mellitus type 2 in nonobese ([place]) 10/28/2012  . Carotid occlusion, right 10/28/2012   Past Medical History:  Diagnosis Date  . Arthritis   . Constipation    OCCASIONAL  . Coronary artery disease   . Diabetes mellitus without complication ([place])   . Environmental allergies   . Hyperlipidemia   . Hypertension   .  Inguinal hernia   . Nocturia   . Peripheral arterial disease ([facility])     Family History  Problem Relation Age of Onset  . Diabetes Mother   . Kidney disease Mother   . Stroke Father   . Stroke Brother   . Cancer Sister   . Cancer Sister   . Diabetes Sister   . Healthy Daughter   . Healthy Daughter   . Healthy Son     Past Surgical History:  Procedure Laterality Date  . BACK SURGERY    . CARDIAC CATHETERIZATION  05/29/2006   single vessel CAD w/moderate stenosis of the prox. RCA approx 40%,prox. LAD 20-30%,minor irreg. mid abdominal aorta,left iliac artery disease 50-60%  . CATARACT EXTRACTION Left 12/2016  . HERNIA  REPAIR  [id number]  . INGUINAL HERNIA REPAIR N/A 09/23/2012   Procedure: LAPAROSCOPIC INGUINAL HERNIA with umbilical hernia;  Surgeon: Madilyn Hook, DO;  Location: [facility];  Service: General;  Laterality: N/A;  . INSERTION OF MESH Bilateral 09/23/2012   Procedure: INSERTION OF MESH;  Surgeon: Madilyn Hook, DO;  Location: [facility];  Service: General;  Laterality: Bilateral;   Social History   Occupational History  . Occupation: retired    Comment: Architect  Tobacco Use  . Smoking status: Former Smoker    Quit date: 04/02/1991    Years since quitting: 27.9  . Smokeless tobacco: Former Systems developer    Quit date: 08/06/1991  Substance and Sexual Activity  . Alcohol use: No  . Drug use: No  . Sexual activity: Not on file

## 2019-03-01 ENCOUNTER — Encounter: Payer: Self-pay | Admitting: Family Medicine

## 2019-03-01 DIAGNOSIS — M545 Low back pain, unspecified: Secondary | ICD-10-CM

## 2019-03-02 ENCOUNTER — Other Ambulatory Visit: Payer: Self-pay

## 2019-03-02 ENCOUNTER — Other Ambulatory Visit: Payer: Self-pay | Admitting: *Deleted

## 2019-03-02 DIAGNOSIS — G20 Parkinson's disease: Secondary | ICD-10-CM

## 2019-03-02 DIAGNOSIS — I739 Peripheral vascular disease, unspecified: Secondary | ICD-10-CM

## 2019-03-03 DIAGNOSIS — Z87891 Personal history of nicotine dependence: Secondary | ICD-10-CM | POA: Diagnosis not present

## 2019-03-03 DIAGNOSIS — E1151 Type 2 diabetes mellitus with diabetic peripheral angiopathy without gangrene: Secondary | ICD-10-CM | POA: Diagnosis not present

## 2019-03-03 DIAGNOSIS — M4726 Other spondylosis with radiculopathy, lumbar region: Secondary | ICD-10-CM | POA: Diagnosis not present

## 2019-03-03 DIAGNOSIS — G20 Parkinson's disease: Secondary | ICD-10-CM | POA: Diagnosis not present

## 2019-03-03 DIAGNOSIS — M1990 Unspecified osteoarthritis, unspecified site: Secondary | ICD-10-CM | POA: Diagnosis not present

## 2019-03-03 DIAGNOSIS — Z7984 Long term (current) use of oral hypoglycemic drugs: Secondary | ICD-10-CM | POA: Diagnosis not present

## 2019-03-03 DIAGNOSIS — E7800 Pure hypercholesterolemia, unspecified: Secondary | ICD-10-CM | POA: Diagnosis not present

## 2019-03-03 DIAGNOSIS — I2510 Atherosclerotic heart disease of native coronary artery without angina pectoris: Secondary | ICD-10-CM | POA: Diagnosis not present

## 2019-03-03 DIAGNOSIS — Z7982 Long term (current) use of aspirin: Secondary | ICD-10-CM | POA: Diagnosis not present

## 2019-03-03 DIAGNOSIS — I6521 Occlusion and stenosis of right carotid artery: Secondary | ICD-10-CM | POA: Diagnosis not present

## 2019-03-03 DIAGNOSIS — I10 Essential (primary) hypertension: Secondary | ICD-10-CM | POA: Diagnosis not present

## 2019-03-03 DIAGNOSIS — W19XXXD Unspecified fall, subsequent encounter: Secondary | ICD-10-CM | POA: Diagnosis not present

## 2019-03-03 DIAGNOSIS — Z9181 History of falling: Secondary | ICD-10-CM | POA: Diagnosis not present

## 2019-03-04 ENCOUNTER — Telehealth: Payer: Self-pay | Admitting: Neurology

## 2019-03-04 NOTE — Telephone Encounter (Signed)
We referred for PT only, would you like to okay the OT assessment as well?

## 2019-03-04 NOTE — Telephone Encounter (Signed)
Verbal orders given to [facility].

## 2019-03-04 NOTE — Telephone Encounter (Signed)
Frankie from [facility] is needing verbal orders for PT 1 week 1, 2 week 1, 1 week 2 and also for OT assessment for bathroom safety/equipment. Can leave VM for verbal. Thanks!

## 2019-03-04 NOTE — Telephone Encounter (Signed)
ok 

## 2019-03-09 ENCOUNTER — Telehealth: Payer: Self-pay | Admitting: Neurology

## 2019-03-09 DIAGNOSIS — E7800 Pure hypercholesterolemia, unspecified: Secondary | ICD-10-CM | POA: Diagnosis not present

## 2019-03-09 DIAGNOSIS — E1151 Type 2 diabetes mellitus with diabetic peripheral angiopathy without gangrene: Secondary | ICD-10-CM | POA: Diagnosis not present

## 2019-03-09 DIAGNOSIS — Z7982 Long term (current) use of aspirin: Secondary | ICD-10-CM | POA: Diagnosis not present

## 2019-03-09 DIAGNOSIS — Z87891 Personal history of nicotine dependence: Secondary | ICD-10-CM | POA: Diagnosis not present

## 2019-03-09 DIAGNOSIS — I6521 Occlusion and stenosis of right carotid artery: Secondary | ICD-10-CM | POA: Diagnosis not present

## 2019-03-09 DIAGNOSIS — Z9181 History of falling: Secondary | ICD-10-CM | POA: Diagnosis not present

## 2019-03-09 DIAGNOSIS — M4726 Other spondylosis with radiculopathy, lumbar region: Secondary | ICD-10-CM | POA: Diagnosis not present

## 2019-03-09 DIAGNOSIS — I2510 Atherosclerotic heart disease of native coronary artery without angina pectoris: Secondary | ICD-10-CM | POA: Diagnosis not present

## 2019-03-09 DIAGNOSIS — G20 Parkinson's disease: Secondary | ICD-10-CM | POA: Diagnosis not present

## 2019-03-09 DIAGNOSIS — Z7984 Long term (current) use of oral hypoglycemic drugs: Secondary | ICD-10-CM | POA: Diagnosis not present

## 2019-03-09 DIAGNOSIS — W19XXXD Unspecified fall, subsequent encounter: Secondary | ICD-10-CM | POA: Diagnosis not present

## 2019-03-09 DIAGNOSIS — M1990 Unspecified osteoarthritis, unspecified site: Secondary | ICD-10-CM | POA: Diagnosis not present

## 2019-03-09 DIAGNOSIS — I10 Essential (primary) hypertension: Secondary | ICD-10-CM | POA: Diagnosis not present

## 2019-03-09 NOTE — Telephone Encounter (Signed)
Verbal orders given per Dr Arturo Morton protocol

## 2019-03-09 NOTE — Telephone Encounter (Signed)
Clair Gulling from [facility] called in needing verbal orders for PT; 2xs a week up to 4 weeks. Can leave VM if you don't get him. Thanks!

## 2019-03-10 DIAGNOSIS — M4726 Other spondylosis with radiculopathy, lumbar region: Secondary | ICD-10-CM | POA: Diagnosis not present

## 2019-03-10 DIAGNOSIS — G20 Parkinson's disease: Secondary | ICD-10-CM | POA: Diagnosis not present

## 2019-03-10 DIAGNOSIS — I2510 Atherosclerotic heart disease of native coronary artery without angina pectoris: Secondary | ICD-10-CM | POA: Diagnosis not present

## 2019-03-10 DIAGNOSIS — Z87891 Personal history of nicotine dependence: Secondary | ICD-10-CM | POA: Diagnosis not present

## 2019-03-10 DIAGNOSIS — E1151 Type 2 diabetes mellitus with diabetic peripheral angiopathy without gangrene: Secondary | ICD-10-CM | POA: Diagnosis not present

## 2019-03-10 DIAGNOSIS — Z7982 Long term (current) use of aspirin: Secondary | ICD-10-CM | POA: Diagnosis not present

## 2019-03-10 DIAGNOSIS — I10 Essential (primary) hypertension: Secondary | ICD-10-CM | POA: Diagnosis not present

## 2019-03-10 DIAGNOSIS — M1990 Unspecified osteoarthritis, unspecified site: Secondary | ICD-10-CM | POA: Diagnosis not present

## 2019-03-10 DIAGNOSIS — W19XXXD Unspecified fall, subsequent encounter: Secondary | ICD-10-CM | POA: Diagnosis not present

## 2019-03-10 DIAGNOSIS — Z7984 Long term (current) use of oral hypoglycemic drugs: Secondary | ICD-10-CM | POA: Diagnosis not present

## 2019-03-10 DIAGNOSIS — Z9181 History of falling: Secondary | ICD-10-CM | POA: Diagnosis not present

## 2019-03-10 DIAGNOSIS — I6521 Occlusion and stenosis of right carotid artery: Secondary | ICD-10-CM | POA: Diagnosis not present

## 2019-03-10 DIAGNOSIS — E7800 Pure hypercholesterolemia, unspecified: Secondary | ICD-10-CM | POA: Diagnosis not present

## 2019-03-11 DIAGNOSIS — G20 Parkinson's disease: Secondary | ICD-10-CM | POA: Diagnosis not present

## 2019-03-11 DIAGNOSIS — I2510 Atherosclerotic heart disease of native coronary artery without angina pectoris: Secondary | ICD-10-CM | POA: Diagnosis not present

## 2019-03-11 DIAGNOSIS — Z7982 Long term (current) use of aspirin: Secondary | ICD-10-CM | POA: Diagnosis not present

## 2019-03-11 DIAGNOSIS — M4726 Other spondylosis with radiculopathy, lumbar region: Secondary | ICD-10-CM | POA: Diagnosis not present

## 2019-03-11 DIAGNOSIS — Z87891 Personal history of nicotine dependence: Secondary | ICD-10-CM | POA: Diagnosis not present

## 2019-03-11 DIAGNOSIS — E7800 Pure hypercholesterolemia, unspecified: Secondary | ICD-10-CM | POA: Diagnosis not present

## 2019-03-11 DIAGNOSIS — M1990 Unspecified osteoarthritis, unspecified site: Secondary | ICD-10-CM | POA: Diagnosis not present

## 2019-03-11 DIAGNOSIS — Z9181 History of falling: Secondary | ICD-10-CM | POA: Diagnosis not present

## 2019-03-11 DIAGNOSIS — Z7984 Long term (current) use of oral hypoglycemic drugs: Secondary | ICD-10-CM | POA: Diagnosis not present

## 2019-03-11 DIAGNOSIS — I6521 Occlusion and stenosis of right carotid artery: Secondary | ICD-10-CM | POA: Diagnosis not present

## 2019-03-11 DIAGNOSIS — E1151 Type 2 diabetes mellitus with diabetic peripheral angiopathy without gangrene: Secondary | ICD-10-CM | POA: Diagnosis not present

## 2019-03-11 DIAGNOSIS — W19XXXD Unspecified fall, subsequent encounter: Secondary | ICD-10-CM | POA: Diagnosis not present

## 2019-03-11 DIAGNOSIS — I10 Essential (primary) hypertension: Secondary | ICD-10-CM | POA: Diagnosis not present

## 2019-03-12 DIAGNOSIS — I2510 Atherosclerotic heart disease of native coronary artery without angina pectoris: Secondary | ICD-10-CM | POA: Diagnosis not present

## 2019-03-12 DIAGNOSIS — E1151 Type 2 diabetes mellitus with diabetic peripheral angiopathy without gangrene: Secondary | ICD-10-CM | POA: Diagnosis not present

## 2019-03-12 DIAGNOSIS — I10 Essential (primary) hypertension: Secondary | ICD-10-CM | POA: Diagnosis not present

## 2019-03-12 DIAGNOSIS — E7800 Pure hypercholesterolemia, unspecified: Secondary | ICD-10-CM | POA: Diagnosis not present

## 2019-03-12 DIAGNOSIS — Z87891 Personal history of nicotine dependence: Secondary | ICD-10-CM | POA: Diagnosis not present

## 2019-03-12 DIAGNOSIS — G20 Parkinson's disease: Secondary | ICD-10-CM | POA: Diagnosis not present

## 2019-03-12 DIAGNOSIS — M1990 Unspecified osteoarthritis, unspecified site: Secondary | ICD-10-CM | POA: Diagnosis not present

## 2019-03-12 DIAGNOSIS — Z9181 History of falling: Secondary | ICD-10-CM | POA: Diagnosis not present

## 2019-03-12 DIAGNOSIS — W19XXXD Unspecified fall, subsequent encounter: Secondary | ICD-10-CM | POA: Diagnosis not present

## 2019-03-12 DIAGNOSIS — I6521 Occlusion and stenosis of right carotid artery: Secondary | ICD-10-CM | POA: Diagnosis not present

## 2019-03-12 DIAGNOSIS — Z7984 Long term (current) use of oral hypoglycemic drugs: Secondary | ICD-10-CM | POA: Diagnosis not present

## 2019-03-12 DIAGNOSIS — Z7982 Long term (current) use of aspirin: Secondary | ICD-10-CM | POA: Diagnosis not present

## 2019-03-12 DIAGNOSIS — M4726 Other spondylosis with radiculopathy, lumbar region: Secondary | ICD-10-CM | POA: Diagnosis not present

## 2019-03-16 DIAGNOSIS — G20 Parkinson's disease: Secondary | ICD-10-CM | POA: Diagnosis not present

## 2019-03-16 DIAGNOSIS — E1151 Type 2 diabetes mellitus with diabetic peripheral angiopathy without gangrene: Secondary | ICD-10-CM | POA: Diagnosis not present

## 2019-03-16 DIAGNOSIS — I6521 Occlusion and stenosis of right carotid artery: Secondary | ICD-10-CM | POA: Diagnosis not present

## 2019-03-16 DIAGNOSIS — I10 Essential (primary) hypertension: Secondary | ICD-10-CM | POA: Diagnosis not present

## 2019-03-16 DIAGNOSIS — W19XXXD Unspecified fall, subsequent encounter: Secondary | ICD-10-CM | POA: Diagnosis not present

## 2019-03-16 DIAGNOSIS — Z87891 Personal history of nicotine dependence: Secondary | ICD-10-CM | POA: Diagnosis not present

## 2019-03-16 DIAGNOSIS — M4726 Other spondylosis with radiculopathy, lumbar region: Secondary | ICD-10-CM | POA: Diagnosis not present

## 2019-03-16 DIAGNOSIS — Z9181 History of falling: Secondary | ICD-10-CM | POA: Diagnosis not present

## 2019-03-16 DIAGNOSIS — Z7984 Long term (current) use of oral hypoglycemic drugs: Secondary | ICD-10-CM | POA: Diagnosis not present

## 2019-03-16 DIAGNOSIS — E119 Type 2 diabetes mellitus without complications: Secondary | ICD-10-CM | POA: Diagnosis not present

## 2019-03-16 DIAGNOSIS — I2510 Atherosclerotic heart disease of native coronary artery without angina pectoris: Secondary | ICD-10-CM | POA: Diagnosis not present

## 2019-03-16 DIAGNOSIS — E559 Vitamin D deficiency, unspecified: Secondary | ICD-10-CM | POA: Diagnosis not present

## 2019-03-16 DIAGNOSIS — E118 Type 2 diabetes mellitus with unspecified complications: Secondary | ICD-10-CM | POA: Diagnosis not present

## 2019-03-16 DIAGNOSIS — E538 Deficiency of other specified B group vitamins: Secondary | ICD-10-CM | POA: Diagnosis not present

## 2019-03-16 DIAGNOSIS — E7800 Pure hypercholesterolemia, unspecified: Secondary | ICD-10-CM | POA: Diagnosis not present

## 2019-03-16 DIAGNOSIS — M1990 Unspecified osteoarthritis, unspecified site: Secondary | ICD-10-CM | POA: Diagnosis not present

## 2019-03-16 DIAGNOSIS — Z7982 Long term (current) use of aspirin: Secondary | ICD-10-CM | POA: Diagnosis not present

## 2019-03-18 DIAGNOSIS — E1151 Type 2 diabetes mellitus with diabetic peripheral angiopathy without gangrene: Secondary | ICD-10-CM | POA: Diagnosis not present

## 2019-03-18 DIAGNOSIS — E7800 Pure hypercholesterolemia, unspecified: Secondary | ICD-10-CM | POA: Diagnosis not present

## 2019-03-18 DIAGNOSIS — W19XXXD Unspecified fall, subsequent encounter: Secondary | ICD-10-CM | POA: Diagnosis not present

## 2019-03-18 DIAGNOSIS — M1990 Unspecified osteoarthritis, unspecified site: Secondary | ICD-10-CM | POA: Diagnosis not present

## 2019-03-18 DIAGNOSIS — M4726 Other spondylosis with radiculopathy, lumbar region: Secondary | ICD-10-CM | POA: Diagnosis not present

## 2019-03-18 DIAGNOSIS — Z7984 Long term (current) use of oral hypoglycemic drugs: Secondary | ICD-10-CM | POA: Diagnosis not present

## 2019-03-18 DIAGNOSIS — Z87891 Personal history of nicotine dependence: Secondary | ICD-10-CM | POA: Diagnosis not present

## 2019-03-18 DIAGNOSIS — I2510 Atherosclerotic heart disease of native coronary artery without angina pectoris: Secondary | ICD-10-CM | POA: Diagnosis not present

## 2019-03-18 DIAGNOSIS — Z9181 History of falling: Secondary | ICD-10-CM | POA: Diagnosis not present

## 2019-03-18 DIAGNOSIS — Z7982 Long term (current) use of aspirin: Secondary | ICD-10-CM | POA: Diagnosis not present

## 2019-03-18 DIAGNOSIS — I6521 Occlusion and stenosis of right carotid artery: Secondary | ICD-10-CM | POA: Diagnosis not present

## 2019-03-18 DIAGNOSIS — G20 Parkinson's disease: Secondary | ICD-10-CM | POA: Diagnosis not present

## 2019-03-18 DIAGNOSIS — I10 Essential (primary) hypertension: Secondary | ICD-10-CM | POA: Diagnosis not present

## 2019-03-22 DIAGNOSIS — E559 Vitamin D deficiency, unspecified: Secondary | ICD-10-CM | POA: Diagnosis not present

## 2019-03-22 DIAGNOSIS — E119 Type 2 diabetes mellitus without complications: Secondary | ICD-10-CM | POA: Diagnosis not present

## 2019-03-22 DIAGNOSIS — I2510 Atherosclerotic heart disease of native coronary artery without angina pectoris: Secondary | ICD-10-CM | POA: Diagnosis not present

## 2019-03-22 DIAGNOSIS — I10 Essential (primary) hypertension: Secondary | ICD-10-CM | POA: Diagnosis not present

## 2019-03-22 DIAGNOSIS — E118 Type 2 diabetes mellitus with unspecified complications: Secondary | ICD-10-CM | POA: Diagnosis not present

## 2019-03-23 ENCOUNTER — Telehealth: Payer: Self-pay | Admitting: Neurology

## 2019-03-23 DIAGNOSIS — E7800 Pure hypercholesterolemia, unspecified: Secondary | ICD-10-CM | POA: Diagnosis not present

## 2019-03-23 DIAGNOSIS — Z9181 History of falling: Secondary | ICD-10-CM | POA: Diagnosis not present

## 2019-03-23 DIAGNOSIS — Z7984 Long term (current) use of oral hypoglycemic drugs: Secondary | ICD-10-CM | POA: Diagnosis not present

## 2019-03-23 DIAGNOSIS — E1151 Type 2 diabetes mellitus with diabetic peripheral angiopathy without gangrene: Secondary | ICD-10-CM | POA: Diagnosis not present

## 2019-03-23 DIAGNOSIS — Z7982 Long term (current) use of aspirin: Secondary | ICD-10-CM | POA: Diagnosis not present

## 2019-03-23 DIAGNOSIS — I6521 Occlusion and stenosis of right carotid artery: Secondary | ICD-10-CM | POA: Diagnosis not present

## 2019-03-23 DIAGNOSIS — M4726 Other spondylosis with radiculopathy, lumbar region: Secondary | ICD-10-CM | POA: Diagnosis not present

## 2019-03-23 DIAGNOSIS — I2510 Atherosclerotic heart disease of native coronary artery without angina pectoris: Secondary | ICD-10-CM | POA: Diagnosis not present

## 2019-03-23 DIAGNOSIS — W19XXXD Unspecified fall, subsequent encounter: Secondary | ICD-10-CM | POA: Diagnosis not present

## 2019-03-23 DIAGNOSIS — M1990 Unspecified osteoarthritis, unspecified site: Secondary | ICD-10-CM | POA: Diagnosis not present

## 2019-03-23 DIAGNOSIS — Z87891 Personal history of nicotine dependence: Secondary | ICD-10-CM | POA: Diagnosis not present

## 2019-03-23 DIAGNOSIS — G20 Parkinson's disease: Secondary | ICD-10-CM | POA: Diagnosis not present

## 2019-03-23 DIAGNOSIS — I10 Essential (primary) hypertension: Secondary | ICD-10-CM | POA: Diagnosis not present

## 2019-03-23 NOTE — Telephone Encounter (Signed)
Patient had an appointment with his primary care physician on December 21.  I did just get a faxed copy of that note, consisting of his labs as well.  Labs were done on March 16, 2019.  Hemoglobin A1c was 6.0.  Vitamin B12 210.  Sodium 139, potassium 4.2, chloride 100, CO2 23, BUN 17, creatinine 0.86, glucose 136, AST 10, ALT 5

## 2019-04-28 DIAGNOSIS — L84 Corns and callosities: Secondary | ICD-10-CM | POA: Diagnosis not present

## 2019-04-28 DIAGNOSIS — L602 Onychogryphosis: Secondary | ICD-10-CM | POA: Diagnosis not present

## 2019-04-28 DIAGNOSIS — E1151 Type 2 diabetes mellitus with diabetic peripheral angiopathy without gangrene: Secondary | ICD-10-CM | POA: Diagnosis not present

## 2019-05-09 ENCOUNTER — Ambulatory Visit: Payer: Medicare Other | Attending: Internal Medicine

## 2019-05-09 DIAGNOSIS — Z23 Encounter for immunization: Secondary | ICD-10-CM | POA: Insufficient documentation

## 2019-05-09 NOTE — Progress Notes (Signed)
   Covid-19 Vaccination Clinic  Name:  Rodney Love    MRN: [id number] DOB: 04/04/1945  05/09/2019  Rodney Love was observed post Covid-19 immunization for 15 minutes without incidence. He was provided with Vaccine Information Sheet and instruction to access the V-Safe system.   Rodney Love was instructed to call 911 with any severe reactions post vaccine: Marland Kitchen Difficulty breathing  . Swelling of your face and throat  . A fast heartbeat  . A bad rash all over your body  . Dizziness and weakness    Immunizations Administered    Name Date Dose VIS Date Route   Pfizer COVID-19 Vaccine 05/09/2019  3:28 PM 0.3 mL 03/12/2019 Intramuscular   Manufacturer: [place]   Lot: [id number]   [place]: [id number]

## 2019-05-25 ENCOUNTER — Ambulatory Visit: Payer: Medicare Other

## 2019-06-03 ENCOUNTER — Ambulatory Visit: Payer: Medicare Other | Attending: Internal Medicine

## 2019-06-03 DIAGNOSIS — Z23 Encounter for immunization: Secondary | ICD-10-CM | POA: Insufficient documentation

## 2019-06-03 NOTE — Progress Notes (Signed)
   Covid-19 Vaccination Clinic  Name:  Rodney Love    MRN: [id number] DOB: Jan 30, 1946  06/03/2019  [name] was observed post Covid-19 immunization for 15 minutes without incident. He was provided with Vaccine Information Sheet and instruction to access the V-Safe system.   [name] was instructed to call 911 with any severe reactions post vaccine: Marland Kitchen Difficulty breathing  . Swelling of face and throat  . A fast heartbeat  . A bad rash all over body  . Dizziness and weakness   Immunizations Administered    Name Date Dose VIS Date Route   Pfizer COVID-19 Vaccine 06/03/2019  1:38 PM 0.3 mL 03/12/2019 Intramuscular   Manufacturer: [place]   Lot: [id number]   [place]: [id number]

## 2019-06-09 DIAGNOSIS — G20 Parkinson's disease: Secondary | ICD-10-CM | POA: Diagnosis not present

## 2019-06-09 DIAGNOSIS — G2581 Restless legs syndrome: Secondary | ICD-10-CM | POA: Diagnosis not present

## 2019-06-10 DIAGNOSIS — G2581 Restless legs syndrome: Secondary | ICD-10-CM | POA: Diagnosis not present

## 2019-06-10 DIAGNOSIS — E538 Deficiency of other specified B group vitamins: Secondary | ICD-10-CM | POA: Diagnosis not present

## 2019-06-10 DIAGNOSIS — G20 Parkinson's disease: Secondary | ICD-10-CM | POA: Diagnosis not present

## 2019-06-10 DIAGNOSIS — E118 Type 2 diabetes mellitus with unspecified complications: Secondary | ICD-10-CM | POA: Diagnosis not present

## 2019-06-11 ENCOUNTER — Telehealth: Payer: Self-pay | Admitting: Neurology

## 2019-06-11 NOTE — Telephone Encounter (Signed)
Patient's daughter called in regarding needing to see if her dad needs to be seen? She said he has not been feeling well. He has been very tired and sluggish. She said that his PCP cannot see him today? Please Advise. Thank you

## 2019-06-11 NOTE — Telephone Encounter (Signed)
Noted.  Pt has an appt in 2 weeks.  Nothing further to be done

## 2019-06-11 NOTE — Telephone Encounter (Signed)
Spoke with patients daughter and she stated her dad is complaining of being tired and week. She states he went to see his pcp and was dx with restless leg syndrome. There were tests and labs that were ran but she has not got the results back yet. She said the np stated this could be related to the parkinson's and she wanted to know if you thought her dad needed to be seen sooner. I advised her to wait for the results from the testing but she is just worried and has been started on Mirapex.    Patients daughter wanted to know what she should do if her dad gets worse over the weekend. Advised patient to call pcp and on-call doctor or urgent care since she does not want to take him to the emergency room.

## 2019-06-14 NOTE — Telephone Encounter (Signed)
Left message on daughters voicemail informing her of Dr Doristine Devoid recommendations. Advised her to contact office with any questions or concerns.

## 2019-06-15 DIAGNOSIS — E039 Hypothyroidism, unspecified: Secondary | ICD-10-CM | POA: Diagnosis not present

## 2019-06-15 DIAGNOSIS — G2581 Restless legs syndrome: Secondary | ICD-10-CM | POA: Diagnosis not present

## 2019-06-17 ENCOUNTER — Telehealth: Payer: Self-pay | Admitting: Neurology

## 2019-06-17 NOTE — Telephone Encounter (Signed)
Labs received from primary care which were dated June 10, 2019.  White blood cells were 6.7, hemoglobin 13.9, hematocrit 42.5 and platelets 205.  Sodium is 140, potassium 4.1, chloride 101, CO2 25, BUN 14, creatinine 1.02, glucose 113, AST 17, ALT 11, TSH was elevated at 4.730, urine culture demonstrated no growth.  Urinalysis was negative, hemoglobin A1c was 5.9, B12 was improved at 626 (December, 2020 it was only 210)

## 2019-06-21 ENCOUNTER — Other Ambulatory Visit: Payer: Self-pay

## 2019-06-21 MED ORDER — CARBIDOPA-LEVODOPA 25-100 MG PO TABS: 1.0000 | ORAL_TABLET | Freq: Every day | ORAL | 1 refills | Status: DC

## 2019-06-21 MED ORDER — CARBIDOPA-LEVODOPA ER 50-200 MG PO TBCR: 1.0000 | EXTENDED_RELEASE_TABLET | Freq: Every day | ORAL | 1 refills | Status: DC

## 2019-06-21 NOTE — Telephone Encounter (Signed)
Patient switched from [facility] to [facility] on [address] in [place]. Patient needs a refill on carbidopa-levodopa (SINEMET CR) 50-200 MG tablet [id number]

## 2019-06-21 NOTE — Telephone Encounter (Signed)
Rx(s) sent to pharmacy electronically.  Patients spouse has been notified and voiced understanding.  

## 2019-06-24 ENCOUNTER — Ambulatory Visit (HOSPITAL_COMMUNITY)
Admission: RE | Admit: 2019-06-24 | Payer: Medicare Other | Source: Ambulatory Visit | Attending: Cardiovascular Disease | Admitting: Cardiovascular Disease

## 2019-06-24 ENCOUNTER — Encounter (HOSPITAL_COMMUNITY): Payer: Self-pay

## 2019-06-24 ENCOUNTER — Ambulatory Visit (HOSPITAL_COMMUNITY)
Admission: RE | Admit: 2019-06-24 | Discharge: 2019-06-24 | Disposition: A | Discharge status: Home / Self Care | Payer: Medicare Other | Source: Ambulatory Visit | Attending: Cardiovascular Disease | Admitting: Cardiovascular Disease

## 2019-06-24 ENCOUNTER — Other Ambulatory Visit: Payer: Self-pay

## 2019-06-24 DIAGNOSIS — I10 Essential (primary) hypertension: Secondary | ICD-10-CM | POA: Diagnosis not present

## 2019-06-24 DIAGNOSIS — I739 Peripheral vascular disease, unspecified: Secondary | ICD-10-CM | POA: Diagnosis not present

## 2019-06-28 NOTE — Progress Notes (Signed)
Assessment/Plan:   1.  Parkinsons Disease  -Continue carbidopa/levodopa 25/100, 1 tablet 4-5 times per day  -Continue carbidopa/levodopa 50/200 at bedtime  2. GAD and depression for motor fluctuations  -We added Lexapro last visit but he never started it.  After discussing r/b/se, he decided to start that medication.  Asked him to trial it for at least 4-6 weeks as long as no SE.  If he doesn't feel better and no SE, we could raise the medication dose.  He was agreeable.    3.  B12 deficiency  -B12 level was 210 in December, 2020.  B12 supplements were initiated and patient's B12 level is now at 626.  4.  RLS  -NP just started patient on pramipexole, .125 mg.  No SE with that and will monitor for that  -will order iron studies and ferritin  -tsh was a tad elevated, which can affect rls.  He states that this is to be repeated at next visit   Subjective:   Rodney Love was seen today in follow up for Parkinsons disease.  My previous records were reviewed prior to todays visit as well as outside records available to me.  I have also reviewed emails sent to me from pt/daughter. Pt denies falls.  Pt denies lightheadedness, near syncope.  No hallucinations.  We added Lexapro last visit and the patient reports that he never started the medication because he worried about risk of suicide.  He asks me about that risk.  Daughter emailed me about NP recently dx RLS.  Pt states that he was given 10 tablets of mirapex, 0.125 mg.  Daughters email said that he was given 30 tablets.  It is helping.  Pt states that he could not keep his feet still before but he is doing well with this that small dosage and no SE. takes it about 30 minutes before bedtime.  Current prescribed movement disorder medications: Carbidopa/levodopa 25/100, 1 tablet 4-5 times per day Carbidopa/levodopa 50/200 at bedtime Lexapro, 10 mg (added last visit but he never started it) Pramipexole 0.125 mg nightly (prescribed by nurse  practitioner for restless leg)   ALLERGIES:  No Known Allergies  CURRENT MEDICATIONS:  Outpatient Encounter Medications as of 06/29/2019  Medication Sig  . aspirin EC 81 MG tablet Take 81 mg by mouth daily.  Marland Kitchen atorvastatin (LIPITOR) 20 MG tablet Take 20 mg by mouth daily.   . carbidopa-levodopa (SINEMET CR) 50-200 MG tablet Take 1 tablet by mouth at bedtime.  . carbidopa-levodopa (SINEMET IR) 25-100 MG tablet Take 1 tablet by mouth 5 (five) times daily.  . Cholecalciferol (VITAMIN D) 50 MCG (2000 UT) CAPS Take 1 capsule by mouth daily.  . metFORMIN (GLUCOPHAGE) 850 MG tablet Take 850 mg by mouth daily with breakfast.  . ONETOUCH ULTRA test strip   . tamsulosin (FLOMAX) 0.4 MG CAPS capsule Take 1 capsule by mouth every evening.   . escitalopram (LEXAPRO) 10 MG tablet Take 1 tablet (10 mg total) by mouth daily. (Patient not taking: Reported on 02/19/2019)   No facility-administered encounter medications on file as of 06/29/2019.    Objective:   PHYSICAL EXAMINATION:    VITALS:   Vitals:   06/29/19 1449  BP: 135/76  Pulse: 75  SpO2: 97%  Weight: 180 lb (81.6 kg)  Height: 6\' 7"  (2.007 m)    GEN:  The patient appears stated age and is in NAD. HEENT:  Normocephalic, atraumatic.  The mucous membranes are moist. The superficial temporal arteries  are without ropiness or tenderness. CV:  RRR Lungs:  CTAB Neck/HEME:  There are no carotid bruits bilaterally.  Neurological examination:  Orientation: The patient is alert and oriented x3. Cranial nerves: There is good facial symmetry with mild facial hypomimia. The speech is fluent and clear. Soft palate rises symmetrically and there is no tongue deviation. Hearing is intact to conversational tone. Sensation: Sensation is intact to light touch throughout Motor: Strength is at least antigravity x4.  Movement examination: Tone: There is mild increased tone in the bilateral UE Abnormal movements: there is intermittent mild LUE rest  tremor Coordination:  There is  decremation with RAM's, mostly with toe and heel taps bilaterally Gait and Station: The patient pushes off of the chair to arise.  He ambulates well in the hall with his walker.    I have reviewed and interpreted the following labs independently Labs received from primary care which were dated June 10, 2019.  White blood cells were 6.7, hemoglobin 13.9, hematocrit 42.5 and platelets 205.  Sodium is 140, potassium 4.1, chloride 101, CO2 25, BUN 14, creatinine 1.02, glucose 113, AST 17, ALT 11, TSH was elevated at 4.730, urine culture demonstrated no growth.  Urinalysis was negative, hemoglobin A1c was 5.9, B12 was improved at 626 (December, 2020 it was only 210)  Total time spent on today's visit was 30 minutes, including both face-to-face time and nonface-to-face time.  Time included that spent on review of records (prior notes available to me/labs/imaging if pertinent), discussing treatment and goals, answering patient's questions and coordinating care.  Cc:  [name], MD

## 2019-06-29 ENCOUNTER — Encounter: Payer: Self-pay | Admitting: Neurology

## 2019-06-29 ENCOUNTER — Other Ambulatory Visit (INDEPENDENT_AMBULATORY_CARE_PROVIDER_SITE_OTHER): Payer: Medicare Other

## 2019-06-29 ENCOUNTER — Ambulatory Visit: Payer: Medicare Other | Admitting: Neurology

## 2019-06-29 ENCOUNTER — Other Ambulatory Visit: Payer: Self-pay

## 2019-06-29 VITALS — BP 135/76 | HR 75 | Ht 79.0 in | Wt 180.0 lb

## 2019-06-29 DIAGNOSIS — G2581 Restless legs syndrome: Secondary | ICD-10-CM | POA: Diagnosis not present

## 2019-06-29 DIAGNOSIS — Z5181 Encounter for therapeutic drug level monitoring: Secondary | ICD-10-CM | POA: Diagnosis not present

## 2019-06-29 DIAGNOSIS — R6889 Other general symptoms and signs: Secondary | ICD-10-CM

## 2019-06-29 DIAGNOSIS — G20 Parkinson's disease: Secondary | ICD-10-CM | POA: Diagnosis not present

## 2019-06-29 DIAGNOSIS — E538 Deficiency of other specified B group vitamins: Secondary | ICD-10-CM

## 2019-06-29 DIAGNOSIS — F411 Generalized anxiety disorder: Secondary | ICD-10-CM

## 2019-06-29 DIAGNOSIS — G20A1 Parkinson's disease without dyskinesia, without mention of fluctuations: Secondary | ICD-10-CM

## 2019-06-29 DIAGNOSIS — D509 Iron deficiency anemia, unspecified: Secondary | ICD-10-CM | POA: Diagnosis not present

## 2019-06-29 LAB — IRON,TIBC AND FERRITIN PANEL
%SAT: 24 % (calc) (ref 20–48)
Ferritin: 42 ng/mL (ref 24–380)
Iron: 62 ug/dL (ref 50–180)
TIBC: 256 mcg/dL (calc) (ref 250–425)

## 2019-06-29 NOTE — Patient Instructions (Addendum)
1.  Start lexapro, 10 mg daily 2.  Your provider has requested that you have labwork completed today. Please go to [facility] ([address]) on the second floor of this building before leaving the office today. You do not need to check in. If you are not called within 15 minutes please check with the front desk.  3.  Follow up with your PCP about your TSH.  It likely needs rechecked in the future. 4.  Keep a list of questions that you have to ask that are important to you for [place] to discuss next visit  The physicians and staff at [facility] are committed to providing excellent care. You may receive a survey requesting feedback about your experience at our office. We strive to receive "very good" responses to the survey questions. If you feel that your experience would prevent you from giving the office a "very good " response, please contact our office to try to remedy the situation. We may be reached at [phone]. Thank you for taking the time out of your busy day to complete the survey.

## 2019-06-30 ENCOUNTER — Telehealth: Payer: Self-pay

## 2019-06-30 NOTE — Telephone Encounter (Signed)
Patient aware of results and recommendations. °

## 2019-06-30 NOTE — Telephone Encounter (Signed)
-----   Message from [place], DO sent at 06/30/2019  9:51 AM EDT ----- Let pt know that I would like to see ferritin over 70 for RLS.  Take otc ferrous sulfate 325 mg bid with orange juice (if not diabetic)or vitamin C

## 2019-07-07 ENCOUNTER — Ambulatory Visit: Payer: Medicare Other | Admitting: Cardiovascular Disease

## 2019-07-09 ENCOUNTER — Ambulatory Visit: Payer: Medicare Other | Admitting: Cardiovascular Disease

## 2019-07-13 DIAGNOSIS — E1169 Type 2 diabetes mellitus with other specified complication: Secondary | ICD-10-CM | POA: Diagnosis not present

## 2019-07-13 DIAGNOSIS — E785 Hyperlipidemia, unspecified: Secondary | ICD-10-CM | POA: Diagnosis not present

## 2019-07-13 DIAGNOSIS — I2510 Atherosclerotic heart disease of native coronary artery without angina pectoris: Secondary | ICD-10-CM | POA: Diagnosis not present

## 2019-07-13 DIAGNOSIS — Z1212 Encounter for screening for malignant neoplasm of rectum: Secondary | ICD-10-CM | POA: Diagnosis not present

## 2019-07-13 DIAGNOSIS — Z7982 Long term (current) use of aspirin: Secondary | ICD-10-CM | POA: Diagnosis not present

## 2019-07-13 DIAGNOSIS — Z0001 Encounter for general adult medical examination with abnormal findings: Secondary | ICD-10-CM | POA: Diagnosis not present

## 2019-07-20 ENCOUNTER — Ambulatory Visit (INDEPENDENT_AMBULATORY_CARE_PROVIDER_SITE_OTHER): Payer: Medicare Other | Admitting: Cardiovascular Disease

## 2019-07-20 ENCOUNTER — Encounter: Payer: Self-pay | Admitting: Cardiovascular Disease

## 2019-07-20 ENCOUNTER — Other Ambulatory Visit: Payer: Self-pay

## 2019-07-20 VITALS — BP 145/75 | HR 73 | Ht 72.0 in | Wt 179.0 lb

## 2019-07-20 DIAGNOSIS — I739 Peripheral vascular disease, unspecified: Secondary | ICD-10-CM | POA: Diagnosis not present

## 2019-07-20 NOTE — Assessment & Plan Note (Signed)
Rodney Love returns today for follow-up.  He did have lower extremity arterial Doppler studies performed in our office 06/24/2019 revealing a right ABI of 0.99 and a left of 0.92.  It appeared that he had a significant lesion in his right external iliac artery.  He really denies claudication but more so has symptoms of restless leg syndrome.  At this point, no further evaluation is warranted.

## 2019-07-20 NOTE — Progress Notes (Signed)
07/20/2019 [name]   May 08, 1945  [id number]  Primary Physician Rodney Pretty, MD Primary Cardiologist: Rodney Harp MD Rodney Love  HPI:  Rodney Love is a 74 y.o.  married Caucasian male father of 25, grandfather to 4 grandchildren accompanied by his wife  today. He is referred by Dr. Sallyanne Love for peripheral vascularevaluation. I last saw him in the office 07/08/2017.  He is accompanied by his daughter Rodney Love today..His problems include hypertension, type 2 diabetes, hyperlipidemia. He had a cardiac catheterization performed 2008 revealing minimal CAD with normal function. He is a known occluded right carotid artery. He does have PAD by duplex ultrasound but asymptomatic from this. I saw him approximately 4 months ago and decided to follow him as needed. I did not think his Doppler results explained his symptoms.  He was walking in [place] and noticed some left hip and leg cramping. Dopplers were performed that showed a right ABI 0.9 and a left ABI of 0.84. 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  Since I saw him 2 years ago he did have Doppler studies performed 06/24/2019 which again revealed a right external iliac artery stenosis with fairly normal ABIs.  He continues to deny claudication but does have symptoms of restless leg syndrome.   Current Meds  Medication Sig  . Ascorbic Acid (VITAMIN C) 100 MG tablet Take 100 mg by mouth daily.  Marland Kitchen aspirin EC 81 MG tablet Take 81 mg by mouth daily.  Marland Kitchen atorvastatin (LIPITOR) 20 MG tablet Take 20 mg by mouth daily.   . carbidopa-levodopa (SINEMET CR) 50-200 MG tablet Take 1 tablet by mouth at bedtime.  . carbidopa-levodopa (SINEMET IR) 25-100 MG tablet Take 1 tablet by mouth 5 (five) times daily.  . Cholecalciferol (VITAMIN D) 50 MCG (2000 UT) CAPS Take 1 capsule by mouth daily.  Marland Kitchen escitalopram (LEXAPRO) 10 MG tablet Take 1 tablet (10 mg total) by mouth  daily.  . ferrous sulfate 324 MG TBEC Take 324 mg by mouth.  . metFORMIN (GLUCOPHAGE) 850 MG tablet Take 850 mg by mouth daily with breakfast.  . ONETOUCH ULTRA test strip   . pramipexole (MIRAPEX) 0.125 MG tablet Take 0.125 mg by mouth at bedtime.  . tamsulosin (FLOMAX) 0.4 MG CAPS capsule Take 1 capsule by mouth every evening.      No Known Allergies  Social History   Socioeconomic History  . Marital status: Married    Spouse name: Rodney Love  . Number of children: 3  . Years of education: 9  . Highest education level: 9th grade  Occupational History  . Occupation: retired    Comment: Architect  Tobacco Use  . Smoking status: Former Smoker    Quit date: 04/02/1991    Years since quitting: 28.3  . Smokeless tobacco: Former Systems developer    Quit date: 08/06/1991  Substance and Sexual Activity  . Alcohol use: No  . Drug use: No  . Sexual activity: Not on file  Other Topics Concern  . Not on file  Social History Narrative   Patient is right-handed. He lives with his wife Rodney Love in a one level home. He walks daily.   Social Determinants of Health   Financial Resource Strain:   . Difficulty of Paying Living Expenses:   Food Insecurity:   . Worried About Charity fundraiser in the Last Year:   . [employer]  in the Last Year:   Transportation Needs:   . Film/video editor (Medical):   Marland Kitchen Lack of Transportation (Non-Medical):   Physical Activity:   . Days of Exercise per Week:   . Minutes of Exercise per Session:   Stress:   . Feeling of Stress :   Social Connections:   . Frequency of Communication with Friends and Family:   . Frequency of Social Gatherings with Friends and Family:   . Attends Religious Services:   . Active Member of Clubs or Organizations:   . Attends Archivist Meetings:   Marland Kitchen Marital Status:   Intimate Partner Violence:   . Fear of Current or Ex-Partner:   . Emotionally Abused:   Marland Kitchen Physically Abused:   . Sexually Abused:      Review of  Systems: General: negative for chills, fever, night sweats or weight changes.  Cardiovascular: negative for chest pain, dyspnea on exertion, edema, orthopnea, palpitations, paroxysmal nocturnal dyspnea or shortness of breath Dermatological: negative for rash Respiratory: negative for cough or wheezing Urologic: negative for hematuria Abdominal: negative for nausea, vomiting, diarrhea, bright red blood per rectum, melena, or hematemesis Neurologic: negative for visual changes, syncope, or dizziness All other systems reviewed and are otherwise negative except as noted above.    Blood pressure (!) 145/75, pulse 73, height 6' (1.829 m), weight 179 lb (81.2 kg), SpO2 99 %.  General appearance: alert and no distress Neck: no adenopathy, no carotid bruit, no JVD, supple, symmetrical, trachea midline and thyroid not enlarged, symmetric, no tenderness/mass/nodules Lungs: clear to auscultation bilaterally Heart: regular rate and rhythm, S1, S2 normal, no murmur, click, rub or gallop Extremities: extremities normal, atraumatic, no cyanosis or edema Pulses: 2+ and symmetric Skin: Skin color, texture, turgor normal. No rashes or lesions Neurologic: Alert and oriented X 3, normal strength and tone. Normal symmetric reflexes. Normal coordination and gait  EKG normal sinus rhythm at 73 without ST or T wave changes.  I personally reviewed this EKG.  ASSESSMENT AND PLAN:   PAD (peripheral artery disease) s/p remote left external iliac artery angioplasty Rodney Love returns today for follow-up.  He did have lower extremity arterial Doppler studies performed in our office 06/24/2019 revealing a right ABI of 0.99 and a left of 0.92.  It appeared that he had a significant lesion in his right external iliac artery.  He really denies claudication but more so has symptoms of restless leg syndrome.  At this point, no further evaluation is warranted.      Rodney Harp MD FACP,FACC,FAHA,  [facility] 07/20/2019 3:42 PM

## 2019-07-20 NOTE — Patient Instructions (Signed)
Medication Instructions:  Your physician recommends that you continue on your current medications as directed. Please refer to the Current Medication list given to you today.  *If you need a refill on your cardiac medications before your next appointment, please call your pharmacy*  Testing/Procedures: Your physician has requested that you have a lower extremity arterial duplex in 1 YEAR. This test is an ultrasound of the arteries in the legs. It looks at arterial blood flow in the legs. Allow one hour for Lower Arterial scans. There are no restrictions or special instructions  Follow-Up: At [facility], you and your health needs are our priority.  As part of our continuing mission to provide you with exceptional heart care, we have created designated Provider Care Teams.  These Care Teams include your primary Cardiologist (physician) and Advanced Practice Providers (APPs -  Physician Assistants and Nurse Practitioners) who all work together to provide you with the care you need, when you need it.  We recommend signing up for the patient portal called "MyChart".  Sign up information is provided on this After Visit Summary.  MyChart is used to connect with patients for Virtual Visits (Telemedicine).  Patients are able to view lab/test results, encounter notes, upcoming appointments, etc.  Non-urgent messages can be sent to your provider as well.   To learn more about what you can do with MyChart, go to [email or web address].    Your next appointment:    AS NEEDED with Dr. Gwenlyn Found

## 2019-08-17 NOTE — Progress Notes (Signed)
Virtual Visit Via Video   The purpose of this virtual visit is to provide medical care while limiting exposure to the novel coronavirus.    Consent was obtained for video visit:  Yes.   Answered questions that patient had about telehealth interaction:  Yes.   I discussed the limitations, risks, security and privacy concerns of performing an evaluation and management service by telemedicine. I also discussed with the patient that there may be a patient responsible charge related to this service. The patient expressed understanding and agreed to proceed.  Pt location: Home Physician Location: office Name of referring provider:  [name], MD I connected with Rodney Love at patients initiation/request on 08/19/2019 at  8:15 AM EDT by video enabled telemedicine application and verified that I am speaking with the correct person using two identifiers. Pt MRN:  [id number] Pt DOB:  09/04/1945 Video Participants:  Rodney Love;  Wife and daughter present and supplement hx and another daughter on phone from [place]  Assessment/Plan:   1.  Parkinsons Disease             -stop carbidopa/levodopa 25/100, 1 tablet 4-5 times per day  -start carbidopa/levodopa 25/100 CR, 1 tab 4 times per day             -Continue carbidopa/levodopa 50/200 at bedtime  -compliance is issue  -recommend PT and will order at home.  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  B12 deficiency             -B12 level was 210 in December, 2020.  B12 supplements were initiated and patient's B12 level is now at 626.  However, he is not taking it consistently and told him to restart.    4.  RLS with low ferritin/iron deficiency             -Recommend discontinue pramipexole given confusion             -Recommend ferrous sulfate, 325 mg twice daily with vitamin C supplement but pt not taking consistently.  Discussed importance of it and why he needs to do  it  5.  REM behavior disorder  -This is commonly associated with PD and the patient is experiencing this.  We discussed that this can be very serious and even harmful.  We talked about medications as well as physical barriers to put in the bed (particularly soft bed rails, pillow barriers).  We talked about moving the night stand so that it is not so close to the side of the bed.   6.  If above not helpful, told them to contact PCP for labs/UA to make sure nothing else going on. Subjective   Patient seen today in follow-up for Parkinson's disease.  Patient worked in today after I received several email messages from the patient's daughter.  Daughter expressed concerns about the patient's balance and patient being more weak and unsteady.  Has had about 3 falls.  With the last one, he was asleep and he fell out of the bed.  With one, he was bending and giving the dog a treat and fell over a rug (no longer there).  With the other, he was getting up out of a kitchen chair and tripped over fell.   In addition, there have been episodes of confusion.  At times, he thinks there is a person in  the home other than his wife.  At other times, he did not think that his wife was there when she was (wife was sleeping in another room b/c she has been sick and that is unusual for them so that may have confused him).  Last visit, I noted that his nurse practitioner had just started him on some low-dose pramipexole for restless leg.  I ordered iron studies to look for source.  His ferritin was low at 42 and I told him to start ferrous sulfate, 325 mg twice daily.  He wanted to stop it b/c he thought that it would cause some lightheadedness.  Current movement d/o meds:  Carbidopa/levodopa 25/100, 1 tablet 4-5 times per day Carbidopa/levodopa 50/200 at bedtime Lexapro, 10 mg (added last few visits but pt won't take) Pramipexole 0.125 mg nightly (prescribed by nurse practitioner for restless leg) Ferrous sulfate, 325 mg  twice daily b12 (pt taking inconsistently - states that it makes him feel tired so takes it inconsistently)  Prior meds:  carbidopa/levodopa 25/100 IR (stopped to see if it would help dizziness)   Current Outpatient Medications on File Prior to Visit  Medication Sig Dispense Refill  . Ascorbic Acid (VITAMIN C) 100 MG tablet Take 100 mg by mouth daily.    Marland Kitchen aspirin EC 81 MG tablet Take 81 mg by mouth daily.    Marland Kitchen atorvastatin (LIPITOR) 20 MG tablet Take 20 mg by mouth daily.     . carbidopa-levodopa (SINEMET CR) 50-200 MG tablet Take 1 tablet by mouth at bedtime. 90 tablet 1  . carbidopa-levodopa (SINEMET IR) 25-100 MG tablet Take 1 tablet by mouth 5 (five) times daily. 450 tablet 1  . Cholecalciferol (VITAMIN D) 50 MCG (2000 UT) CAPS Take 1 capsule by mouth daily.    Marland Kitchen escitalopram (LEXAPRO) 10 MG tablet Take 1 tablet (10 mg total) by mouth daily. 90 tablet 1  . ferrous sulfate 324 MG TBEC Take 324 mg by mouth.    . metFORMIN (GLUCOPHAGE) 850 MG tablet Take 850 mg by mouth daily with breakfast.    . ONETOUCH ULTRA test strip     . pramipexole (MIRAPEX) 0.125 MG tablet Take 0.125 mg by mouth at bedtime.    . tamsulosin (FLOMAX) 0.4 MG CAPS capsule Take 1 capsule by mouth every evening.      No current facility-administered medications on file prior to visit.     Objective   There were no vitals filed for this visit. GEN:  The patient appears stated age and is in NAD.  Neurological examination:  Orientation: The patient is alert and oriented x3. Cranial nerves: There is good facial symmetry. There is facial hypomimia.  The speech is fluent and clear. Soft palate rises symmetrically and there is no tongue deviation. Hearing is intact to conversational tone. Motor: Strength is at least antigravity x 4.   Shoulder shrug is equal and symmetric.  There is no pronator drift.  Movement examination: Tone: unable Abnormal movements: none Coordination:  There is decremation with RAM's, on  the L Gait and Station: The patient's stride length is decreased.    No results found for: TSH Lab Results  Component Value Date   IRON 62 06/29/2019   TIBC 256 06/29/2019   FERRITIN 42 06/29/2019      Follow up Instructions      -I discussed the assessment and treatment plan with the patient. The patient was provided an opportunity to ask questions and all were answered. The patient agreed with  the plan and demonstrated an understanding of the instructions.   The patient was advised to call back or seek an in-person evaluation if the symptoms worsen or if the condition fails to improve as anticipated.    Total time spent on today's visit was 40 minutes, including both face-to-face time and nonface-to-face time.  Time included that spent on review of records (prior notes available to me/labs/imaging if pertinent), discussing treatment and goals, answering patient's questions and coordinating care.   Alonza Bogus, DO

## 2019-08-18 DIAGNOSIS — L84 Corns and callosities: Secondary | ICD-10-CM | POA: Diagnosis not present

## 2019-08-18 DIAGNOSIS — E1151 Type 2 diabetes mellitus with diabetic peripheral angiopathy without gangrene: Secondary | ICD-10-CM | POA: Diagnosis not present

## 2019-08-18 DIAGNOSIS — R2689 Other abnormalities of gait and mobility: Secondary | ICD-10-CM | POA: Diagnosis not present

## 2019-08-18 DIAGNOSIS — L602 Onychogryphosis: Secondary | ICD-10-CM | POA: Diagnosis not present

## 2019-08-18 DIAGNOSIS — M6281 Muscle weakness (generalized): Secondary | ICD-10-CM | POA: Diagnosis not present

## 2019-08-19 ENCOUNTER — Other Ambulatory Visit: Payer: Self-pay

## 2019-08-19 ENCOUNTER — Telehealth (INDEPENDENT_AMBULATORY_CARE_PROVIDER_SITE_OTHER): Payer: Medicare Other | Admitting: Neurology

## 2019-08-19 ENCOUNTER — Encounter: Payer: Self-pay | Admitting: Neurology

## 2019-08-19 VITALS — Ht 72.0 in | Wt 172.0 lb

## 2019-08-19 DIAGNOSIS — E611 Iron deficiency: Secondary | ICD-10-CM | POA: Diagnosis not present

## 2019-08-19 DIAGNOSIS — G20 Parkinson's disease: Secondary | ICD-10-CM

## 2019-08-19 DIAGNOSIS — E538 Deficiency of other specified B group vitamins: Secondary | ICD-10-CM | POA: Diagnosis not present

## 2019-08-19 DIAGNOSIS — G2581 Restless legs syndrome: Secondary | ICD-10-CM

## 2019-08-19 MED ORDER — CARBIDOPA-LEVODOPA ER 25-100 MG PO TBCR: 1.0000 | EXTENDED_RELEASE_TABLET | Freq: Four times a day (QID) | ORAL | 1 refills | Status: DC

## 2019-08-26 ENCOUNTER — Encounter (HOSPITAL_COMMUNITY): Payer: Self-pay

## 2019-08-26 ENCOUNTER — Other Ambulatory Visit: Payer: Self-pay

## 2019-08-26 ENCOUNTER — Emergency Department (HOSPITAL_COMMUNITY): Admission: EM | Admit: 2019-08-26 | Payer: Self-pay | Source: Home / Self Care

## 2019-08-26 DIAGNOSIS — R253 Fasciculation: Secondary | ICD-10-CM | POA: Insufficient documentation

## 2019-08-26 DIAGNOSIS — G20 Parkinson's disease: Secondary | ICD-10-CM | POA: Diagnosis not present

## 2019-08-26 DIAGNOSIS — Z87891 Personal history of nicotine dependence: Secondary | ICD-10-CM | POA: Diagnosis not present

## 2019-08-26 DIAGNOSIS — Z7984 Long term (current) use of oral hypoglycemic drugs: Secondary | ICD-10-CM | POA: Diagnosis not present

## 2019-08-26 DIAGNOSIS — I10 Essential (primary) hypertension: Secondary | ICD-10-CM | POA: Insufficient documentation

## 2019-08-26 DIAGNOSIS — E7800 Pure hypercholesterolemia, unspecified: Secondary | ICD-10-CM | POA: Diagnosis not present

## 2019-08-26 DIAGNOSIS — Z9181 History of falling: Secondary | ICD-10-CM | POA: Diagnosis not present

## 2019-08-26 DIAGNOSIS — E1151 Type 2 diabetes mellitus with diabetic peripheral angiopathy without gangrene: Secondary | ICD-10-CM | POA: Diagnosis not present

## 2019-08-26 DIAGNOSIS — I2510 Atherosclerotic heart disease of native coronary artery without angina pectoris: Secondary | ICD-10-CM | POA: Insufficient documentation

## 2019-08-26 DIAGNOSIS — E538 Deficiency of other specified B group vitamins: Secondary | ICD-10-CM | POA: Diagnosis not present

## 2019-08-26 DIAGNOSIS — E559 Vitamin D deficiency, unspecified: Secondary | ICD-10-CM | POA: Diagnosis not present

## 2019-08-26 DIAGNOSIS — Z7982 Long term (current) use of aspirin: Secondary | ICD-10-CM | POA: Diagnosis not present

## 2019-08-26 DIAGNOSIS — Z79899 Other long term (current) drug therapy: Secondary | ICD-10-CM | POA: Insufficient documentation

## 2019-08-26 DIAGNOSIS — M79604 Pain in right leg: Secondary | ICD-10-CM | POA: Diagnosis present

## 2019-08-26 DIAGNOSIS — E119 Type 2 diabetes mellitus without complications: Secondary | ICD-10-CM | POA: Diagnosis not present

## 2019-08-26 DIAGNOSIS — G2581 Restless legs syndrome: Secondary | ICD-10-CM | POA: Diagnosis not present

## 2019-08-26 DIAGNOSIS — R52 Pain, unspecified: Secondary | ICD-10-CM | POA: Diagnosis not present

## 2019-08-26 NOTE — ED Triage Notes (Signed)
Pt reports bilateral leg pain x 2 weeks. He denies injury. States that he took 2 tylenol around 2p without relief. Denies redness or spots on his leg. Reports some swelling in his toes.

## 2019-08-26 NOTE — ED Triage Notes (Signed)
Per EMS- patient is from home. Patient c/o bilateral lower leg pain Patient reports that he can not stand.  Patient's family reported iron deficiency and parkinson's history.

## 2019-08-27 ENCOUNTER — Emergency Department (HOSPITAL_COMMUNITY)
Admission: EM | Admit: 2019-08-27 | Discharge: 2019-08-27 | Disposition: A | Discharge status: Home / Self Care | Payer: Medicare Other | Attending: Emergency Medicine | Admitting: Emergency Medicine

## 2019-08-27 ENCOUNTER — Encounter (HOSPITAL_COMMUNITY): Payer: Self-pay | Admitting: Emergency Medicine

## 2019-08-27 DIAGNOSIS — R253 Fasciculation: Secondary | ICD-10-CM | POA: Diagnosis not present

## 2019-08-27 HISTORY — DX: Parkinson's disease without dyskinesia, without mention of fluctuations: G20.A1

## 2019-08-27 HISTORY — DX: Iron deficiency: E61.1

## 2019-08-27 HISTORY — DX: Parkinson's disease: G20

## 2019-08-27 LAB — CBC WITH DIFFERENTIAL/PLATELET
Abs Immature Granulocytes: 0.06 10*3/uL (ref 0.00–0.07)
Basophils Absolute: 0 10*3/uL (ref 0.0–0.1)
Basophils Relative: 1 %
Eosinophils Absolute: 0.1 10*3/uL (ref 0.0–0.5)
Eosinophils Relative: 1 %
HCT: 40.1 % (ref 39.0–52.0)
Hemoglobin: 13.2 g/dL (ref 13.0–17.0)
Immature Granulocytes: 1 %
Lymphocytes Relative: 14 %
Lymphs Abs: 1.1 10*3/uL (ref 0.7–4.0)
MCH: 31.1 pg (ref 26.0–34.0)
MCHC: 32.9 g/dL (ref 30.0–36.0)
MCV: 94.6 fL (ref 80.0–100.0)
Monocytes Absolute: 0.6 10*3/uL (ref 0.1–1.0)
Monocytes Relative: 8 %
Neutro Abs: 6 10*3/uL (ref 1.7–7.7)
Neutrophils Relative %: 75 %
Platelets: 236 10*3/uL (ref 150–400)
RBC: 4.24 MIL/uL (ref 4.22–5.81)
RDW: 13.4 % (ref 11.5–15.5)
WBC: 7.9 10*3/uL (ref 4.0–10.5)
nRBC: 0 % (ref 0.0–0.2)

## 2019-08-27 LAB — BASIC METABOLIC PANEL
Anion gap: 8 (ref 5–15)
BUN: 20 mg/dL (ref 8–23)
CO2: 24 mmol/L (ref 22–32)
Calcium: 8.9 mg/dL (ref 8.9–10.3)
Chloride: 108 mmol/L (ref 98–111)
Creatinine, Ser: 0.6 mg/dL — ABNORMAL LOW (ref 0.61–1.24)
GFR calc Af Amer: >60 mL/min (ref >60)
GFR calc non Af Amer: >60 mL/min (ref >60)
Glucose, Bld: 119 mg/dL — ABNORMAL HIGH (ref 70–99)
Potassium: 3.2 mmol/L — ABNORMAL LOW (ref 3.5–5.1)
Sodium: 140 mmol/L (ref 135–145)

## 2019-08-27 LAB — D-DIMER, QUANTITATIVE: D-Dimer, Quant: 0.75 ug/mL-FEU — ABNORMAL HIGH (ref 0.00–0.50)

## 2019-08-27 MED ORDER — DIAZEPAM 5 MG PO TABS
5.0000 mg | ORAL_TABLET | Freq: Once | ORAL | Status: AC
  Administered 2019-08-27: 5 mg via ORAL
  Filled 2019-08-27: qty 1

## 2019-08-27 MED ORDER — POTASSIUM CHLORIDE CRYS ER 20 MEQ PO TBCR
40.0000 meq | EXTENDED_RELEASE_TABLET | Freq: Once | ORAL | Status: AC
  Administered 2019-08-27: 40 meq via ORAL
  Filled 2019-08-27: qty 2

## 2019-08-27 MED ORDER — DIAZEPAM 2 MG PO TABS
2.0000 mg | ORAL_TABLET | Freq: Four times a day (QID) | ORAL | 0 refills | Status: DC | PRN
Start: 2019-08-27 — End: 2019-10-06

## 2019-08-27 NOTE — ED Provider Notes (Signed)
[place] DEPT Provider Note: Georgena Spurling, MD, FACEP  CSN: [id number] MRN: [id number] ARRIVAL: 08/26/19 at [place]  Leg Pain   HISTORY OF PRESENT ILLNESS  08/27/19 1:03 AM Rodney Love is a 74 y.o. male with a history of Parkinson's disease currently on carbidopa/levodopa.  He has a history of restless leg syndrome.  He was previously on pramipexole 0.125 mg nightly but this was discontinued because, reportedly, it had made him iron deficient (or he was discovered to be iron deficient).  The pramipexole was discontinued by his neurologist, Dr. Wells Guiles Tat, who instead treated him with iron, B12 and vitamin C.  He is here with a 2-week exacerbation of his restless leg syndrome.  He also states that his legs are more swollen than normal.  He explains to me that it is not really a pain (although he told the triage nurse that it was an aching sensation that he rated an 8 out of 10) but rather a sensation that he needed to move his legs.  Nothing currently makes it better or worse.  He denies any other acute complaints but states that the symptoms are severe enough that he cannot stand.   Past Medical History:  Diagnosis Date  . Arthritis   . Constipation    OCCASIONAL  . Coronary artery disease   . Diabetes mellitus without complication ([place])   . Environmental allergies   . Hyperlipidemia   . Hypertension   . Inguinal hernia   . Iron deficiency   . Nocturia   . Parkinson disease ([place])   . Peripheral arterial disease [facility])     Past Surgical History:  Procedure Laterality Date  . BACK SURGERY    . CARDIAC CATHETERIZATION  05/29/2006   single vessel CAD w/moderate stenosis of the prox. RCA approx 40%,prox. LAD 20-30%,minor irreg. mid abdominal aorta,left iliac artery disease 50-60%  . CATARACT EXTRACTION Left 12/2016  . HERNIA REPAIR  [id number]  . INGUINAL HERNIA REPAIR N/A 09/23/2012   Procedure: LAPAROSCOPIC INGUINAL HERNIA with umbilical  hernia;  Surgeon: Madilyn Hook, DO;  Location: [facility] ORS;  Service: General;  Laterality: N/A;  . INSERTION OF MESH Bilateral 09/23/2012   Procedure: INSERTION OF MESH;  Surgeon: Madilyn Hook, DO;  Location: [facility];  Service: General;  Laterality: Bilateral;    Family History  Problem Relation Age of Onset  . Diabetes Mother   . Kidney disease Mother   . Stroke Father   . Stroke Brother   . Cancer Sister   . Cancer Sister   . Diabetes Sister   . Healthy Daughter   . Healthy Daughter   . Healthy Son     Social History   Tobacco Use  . Smoking status: Former Smoker    Quit date: 04/02/1991    Years since quitting: 28.4  . Smokeless tobacco: Former Systems developer    Quit date: 08/06/1991  Substance Use Topics  . Alcohol use: No  . Drug use: No    Prior to Admission medications   Medication Sig Start Date End Date Taking? Authorizing Provider  Ascorbic Acid (VITAMIN C) 100 MG tablet Take 100 mg by mouth daily.    [provider]  aspirin EC 81 MG tablet Take 81 mg by mouth daily.    [provider]  atorvastatin (LIPITOR) 20 MG tablet Take 20 mg by mouth daily.  01/27/13   [provider]  carbidopa-levodopa (SINEMET CR) 50-200 MG tablet Take 1 tablet  by mouth at bedtime. 06/21/19   Tat, Eustace Quail, DO  Carbidopa-Levodopa ER (SINEMET CR) 25-100 MG tablet controlled release Take 1 tablet by mouth in the morning, at noon, in the evening, and at bedtime. 08/19/19   Tat, Eustace Quail, DO  Cholecalciferol (VITAMIN D) 50 MCG (2000 UT) CAPS Take 1 capsule by mouth daily.    [provider]  diazepam (VALIUM) 2 MG tablet Take 1 tablet (2 mg total) by mouth every 6 (six) hours as needed for muscle spasms. 08/27/19   Teniqua Marron, Yossef, MD  escitalopram (LEXAPRO) 10 MG tablet Take 1 tablet (10 mg total) by mouth daily. 01/28/19   Tat, Eustace Quail, DO  ferrous sulfate 324 MG TBEC Take 324 mg by mouth daily with breakfast.     [provider]  metFORMIN (GLUCOPHAGE) 850 MG  tablet Take 850 mg by mouth daily with breakfast.    [provider]  [facility] ULTRA test strip  02/04/19   [provider]  tamsulosin (FLOMAX) 0.4 MG CAPS capsule Take 1 capsule by mouth every evening.  01/27/13   [provider]  pramipexole (MIRAPEX) 0.125 MG tablet Take 0.125 mg by mouth at bedtime.  08/27/19  [provider]    Allergies Patient has no known allergies.   REVIEW OF SYSTEMS  Negative except as noted here or in the History of Present Illness.   PHYSICAL EXAMINATION  Initial Vital Signs Blood pressure (!) 154/79, pulse 73, temperature 98 F (36.7 C), temperature source Oral, resp. rate 18, SpO2 94 %.  Examination General: Well-developed, well-nourished male in no acute distress; appearance consistent with age of record HENT: normocephalic; atraumatic Eyes: pupils equal, round and reactive to light; extraocular muscles intact; bilateral pseudophakia Neck: supple Heart: regular rate and rhythm Lungs: clear to auscultation bilaterally Abdomen: soft; nondistended; nontender; bowel sounds present Extremities: No deformity; full range of motion; trace edema of lower legs Neurologic: Awake, alert and oriented; motor function intact in all extremities and symmetric; no facial droop; resting tremor Skin: Warm and dry Psychiatric: Normal mood and affect   RESULTS  Summary of this visit's results, reviewed and interpreted by myself:   EKG Interpretation  Date/Time:    Ventricular Rate:    PR Interval:    QRS Duration:   QT Interval:    QTC Calculation:   R Axis:     Text Interpretation:        Laboratory Studies: Results for orders placed or performed during the hospital encounter of 08/27/19 (from the past 24 hour(s))  CBC with Differential/Platelet     Status: None   Collection Time: 08/27/19  1:38 AM  Result Value Ref Range   WBC 7.9 4.0 - 10.5 K/uL   RBC 4.24 4.22 - 5.81 MIL/uL   Hemoglobin 13.2 13.0 - 17.0 g/dL    HCT 40.1 39.0 - 52.0 %   MCV 94.6 80.0 - 100.0 fL   MCH 31.1 26.0 - 34.0 pg   MCHC 32.9 30.0 - 36.0 g/dL   RDW 13.4 11.5 - 15.5 %   Platelets 236 150 - 400 K/uL   nRBC 0.0 0.0 - 0.2 %   Neutrophils Relative % 75 %   Neutro Abs 6.0 1.7 - 7.7 K/uL   Lymphocytes Relative 14 %   Lymphs Abs 1.1 0.7 - 4.0 K/uL   Monocytes Relative 8 %   Monocytes Absolute 0.6 0.1 - 1.0 K/uL   Eosinophils Relative 1 %   Eosinophils Absolute 0.1 0.0 - 0.5 K/uL  Basophils Relative 1 %   Basophils Absolute 0.0 0.0 - 0.1 K/uL   Immature Granulocytes 1 %   Abs Immature Granulocytes 0.06 0.00 - 0.07 K/uL  Basic metabolic panel     Status: Abnormal   Collection Time: 08/27/19  1:38 AM  Result Value Ref Range   Sodium 140 135 - 145 mmol/L   Potassium 3.2 (L) 3.5 - 5.1 mmol/L   Chloride 108 98 - 111 mmol/L   CO2 24 22 - 32 mmol/L   Glucose, Bld 119 (H) 70 - 99 mg/dL   BUN 20 8 - 23 mg/dL   Creatinine, Ser 0.60 (L) 0.61 - 1.24 mg/dL   Calcium 8.9 8.9 - 10.3 mg/dL   GFR calc non Af Amer >60 >60 mL/min   GFR calc Af Amer >60 >60 mL/min   Anion gap 8 5 - 15  D-dimer, quantitative (not at [facility])     Status: Abnormal   Collection Time: 08/27/19  1:38 AM  Result Value Ref Range   D-Dimer, Quant 0.75 (H) 0.00 - 0.50 ug/mL-FEU   Imaging Studies: No results found.  ED COURSE and MDM  Nursing notes, initial and subsequent vitals signs, including pulse oximetry, reviewed and interpreted by myself.  Vitals:   08/26/19 1932 08/27/19 0105 08/27/19 0200  BP: (!) 154/79 (!) 152/74 (!) 173/83  Pulse: 73 72 79  Resp: 18 18 16   Temp: 98 F (36.7 C) 98.7 F (37.1 C)   TempSrc: Oral Oral   SpO2: 94% 95% 93%  Weight:  78 kg   Height:  6' (1.829 m)    Medications  potassium chloride SA (KLOR-CON) CR tablet 40 mEq (has no administration in time range)  diazepam (VALIUM) tablet 5 mg (has no administration in time range)    0.75 is the age-adjusted cut off for normal D-dimer range.  The patient describes his  symptoms in terms of his previous restless leg syndrome and I have a low suspicion for acute DVT, especially given his symptoms are bilateral.    2:42 AM Patient observed and his symptoms occur coincidentally with visible fasciculations or tremor of the lower extremities.  I do not think a narcotic would be beneficial in the symptoms but a muscle relaxant such as Valium may minimize these fasciculations without interfering with his Parkinson's medications.  PROCEDURES  Procedures   ED DIAGNOSES     ICD-10-CM   1. Muscle fasciculation  R25.3        Dewayne Severe, Jenny Reichmann, MD 08/27/19 [phone]

## 2019-08-27 NOTE — ED Notes (Signed)
Pt a/o and ambulatory with two person assist at discharge. Discharge instructions provided to pt and wife.

## 2019-09-03 ENCOUNTER — Other Ambulatory Visit: Payer: Self-pay

## 2019-09-03 ENCOUNTER — Emergency Department (HOSPITAL_COMMUNITY): Payer: Medicare Other

## 2019-09-03 ENCOUNTER — Encounter (HOSPITAL_COMMUNITY): Payer: Self-pay

## 2019-09-03 ENCOUNTER — Emergency Department (HOSPITAL_COMMUNITY)
Admission: EM | Admit: 2019-09-03 | Discharge: 2019-09-03 | Disposition: A | Discharge status: Home / Self Care | Payer: Medicare Other | Attending: Emergency Medicine | Admitting: Emergency Medicine

## 2019-09-03 DIAGNOSIS — I10 Essential (primary) hypertension: Secondary | ICD-10-CM | POA: Insufficient documentation

## 2019-09-03 DIAGNOSIS — Y999 Unspecified external cause status: Secondary | ICD-10-CM | POA: Diagnosis not present

## 2019-09-03 DIAGNOSIS — E119 Type 2 diabetes mellitus without complications: Secondary | ICD-10-CM | POA: Diagnosis not present

## 2019-09-03 DIAGNOSIS — Y939 Activity, unspecified: Secondary | ICD-10-CM | POA: Diagnosis not present

## 2019-09-03 DIAGNOSIS — G2581 Restless legs syndrome: Secondary | ICD-10-CM | POA: Diagnosis not present

## 2019-09-03 DIAGNOSIS — W19XXXA Unspecified fall, initial encounter: Secondary | ICD-10-CM | POA: Insufficient documentation

## 2019-09-03 DIAGNOSIS — R079 Chest pain, unspecified: Secondary | ICD-10-CM | POA: Diagnosis not present

## 2019-09-03 DIAGNOSIS — Z79899 Other long term (current) drug therapy: Secondary | ICD-10-CM | POA: Insufficient documentation

## 2019-09-03 DIAGNOSIS — S0990XA Unspecified injury of head, initial encounter: Secondary | ICD-10-CM | POA: Diagnosis not present

## 2019-09-03 DIAGNOSIS — R451 Restlessness and agitation: Secondary | ICD-10-CM | POA: Insufficient documentation

## 2019-09-03 DIAGNOSIS — Z7984 Long term (current) use of oral hypoglycemic drugs: Secondary | ICD-10-CM | POA: Diagnosis not present

## 2019-09-03 DIAGNOSIS — I2510 Atherosclerotic heart disease of native coronary artery without angina pectoris: Secondary | ICD-10-CM | POA: Diagnosis not present

## 2019-09-03 DIAGNOSIS — Y929 Unspecified place or not applicable: Secondary | ICD-10-CM | POA: Insufficient documentation

## 2019-09-03 DIAGNOSIS — E1151 Type 2 diabetes mellitus with diabetic peripheral angiopathy without gangrene: Secondary | ICD-10-CM | POA: Diagnosis not present

## 2019-09-03 DIAGNOSIS — E538 Deficiency of other specified B group vitamins: Secondary | ICD-10-CM | POA: Diagnosis not present

## 2019-09-03 DIAGNOSIS — S3993XA Unspecified injury of pelvis, initial encounter: Secondary | ICD-10-CM | POA: Diagnosis not present

## 2019-09-03 DIAGNOSIS — Z87891 Personal history of nicotine dependence: Secondary | ICD-10-CM | POA: Diagnosis not present

## 2019-09-03 DIAGNOSIS — E7800 Pure hypercholesterolemia, unspecified: Secondary | ICD-10-CM | POA: Diagnosis not present

## 2019-09-03 DIAGNOSIS — Z9181 History of falling: Secondary | ICD-10-CM | POA: Diagnosis not present

## 2019-09-03 DIAGNOSIS — G4700 Insomnia, unspecified: Secondary | ICD-10-CM | POA: Diagnosis not present

## 2019-09-03 DIAGNOSIS — E559 Vitamin D deficiency, unspecified: Secondary | ICD-10-CM | POA: Diagnosis not present

## 2019-09-03 DIAGNOSIS — Z7982 Long term (current) use of aspirin: Secondary | ICD-10-CM | POA: Insufficient documentation

## 2019-09-03 DIAGNOSIS — G20 Parkinson's disease: Secondary | ICD-10-CM | POA: Diagnosis not present

## 2019-09-03 DIAGNOSIS — R296 Repeated falls: Secondary | ICD-10-CM | POA: Diagnosis not present

## 2019-09-03 LAB — URINALYSIS, ROUTINE W REFLEX MICROSCOPIC
Bilirubin Urine: NEGATIVE
Glucose, UA: NEGATIVE mg/dL
Hgb urine dipstick: NEGATIVE
Ketones, ur: NEGATIVE mg/dL
Leukocytes,Ua: NEGATIVE
Nitrite: NEGATIVE
Protein, ur: NEGATIVE mg/dL
Specific Gravity, Urine: 1.004 — ABNORMAL LOW (ref 1.005–1.030)
pH: 8 (ref 5.0–8.0)

## 2019-09-03 LAB — CBC
HCT: 40.2 % (ref 39.0–52.0)
Hemoglobin: 12.9 g/dL — ABNORMAL LOW (ref 13.0–17.0)
MCH: 31.1 pg (ref 26.0–34.0)
MCHC: 32.1 g/dL (ref 30.0–36.0)
MCV: 96.9 fL (ref 80.0–100.0)
Platelets: 245 10*3/uL (ref 150–400)
RBC: 4.15 MIL/uL — ABNORMAL LOW (ref 4.22–5.81)
RDW: 13.8 % (ref 11.5–15.5)
WBC: 7.6 10*3/uL (ref 4.0–10.5)
nRBC: 0 % (ref 0.0–0.2)

## 2019-09-03 LAB — COMPREHENSIVE METABOLIC PANEL
ALT: 10 U/L (ref 0–44)
AST: 15 U/L (ref 15–41)
Albumin: 3.7 g/dL (ref 3.5–5.0)
Alkaline Phosphatase: 79 U/L (ref 38–126)
Anion gap: 8 (ref 5–15)
BUN: 14 mg/dL (ref 8–23)
CO2: 26 mmol/L (ref 22–32)
Calcium: 8.7 mg/dL — ABNORMAL LOW (ref 8.9–10.3)
Chloride: 107 mmol/L (ref 98–111)
Creatinine, Ser: 0.73 mg/dL (ref 0.61–1.24)
GFR calc Af Amer: >60 mL/min (ref >60)
GFR calc non Af Amer: >60 mL/min (ref >60)
Glucose, Bld: 129 mg/dL — ABNORMAL HIGH (ref 70–99)
Potassium: 3.8 mmol/L (ref 3.5–5.1)
Sodium: 141 mmol/L (ref 135–145)
Total Bilirubin: 1.3 mg/dL — ABNORMAL HIGH (ref 0.3–1.2)
Total Protein: 6.8 g/dL (ref 6.5–8.1)

## 2019-09-03 NOTE — ED Provider Notes (Signed)
[facility] DEPT Provider Note   CSN: [id number] Arrival date & time: 09/03/19  1329     History Chief Complaint  Patient presents with  . multiple falls  . Joint Swelling  . Urinary Frequency    Rodney Love is a 74 y.o. male.  HPI Patient presenting for evaluation of multiple falls with injuries.  He was also seen in the ED for difficulty with legs, and diagnosed with restless leg syndrome, treated with diazepam and referred back to his neurologist.  This visit was on 08/27/2019.  Patient's wife is with him at this time and states that he has fallen several times since seen in the ED about a week ago.  She thinks he has only taken one diazepam pill which was prescribed for restless legs.  Today, he was being seen by physical therapy, for an evaluation for potential therapy in his home.  That had been sent out by his insurance company for assessment.  After learning about the following they recommended that the patient be sent to the ED, which was done by EMS.  Patient's wife states she has trouble getting him up after the falls, and thinks he might of injured his head recently.  Patient has not been otherwise sick with fever, chills, vomiting or diarrhea.  He has had a nonproductive cough recently.  He has had some urinary frequency but does not complain when he urinates.  There is been no diarrhea.   Level 5 caveat-patient is unable to give history secondary to altered mental status    Past Medical History:  Diagnosis Date  . Arthritis   . Constipation    OCCASIONAL  . Coronary artery disease   . Diabetes mellitus without complication ([place])   . Environmental allergies   . Hyperlipidemia   . Hypertension   . Inguinal hernia   . Iron deficiency   . Nocturia   . Parkinson disease ([place])   . Peripheral arterial disease [facility])     Patient Active Problem List   Diagnosis Date Noted  . PD (Parkinson's disease) ([place]) 12/18/2015  . Tremor of both  hands 11/22/2015  . PAD (peripheral artery disease) s/p remote left external iliac artery angioplasty 10/28/2012  . Coronary atherosclerosis 10/28/2012  . Hypercholesterolemia 10/28/2012  . Essential hypertension 10/28/2012  . Diabetes mellitus type 2 in nonobese ([place]) 10/28/2012  . Carotid occlusion, right 10/28/2012    Past Surgical History:  Procedure Laterality Date  . BACK SURGERY    . CARDIAC CATHETERIZATION  05/29/2006   single vessel CAD w/moderate stenosis of the prox. RCA approx 40%,prox. LAD 20-30%,minor irreg. mid abdominal aorta,left iliac artery disease 50-60%  . CATARACT EXTRACTION Left 12/2016  . HERNIA REPAIR  [id number]  . INGUINAL HERNIA REPAIR N/A 09/23/2012   Procedure: LAPAROSCOPIC INGUINAL HERNIA with umbilical hernia;  Surgeon: Madilyn Hook, DO;  Location: [facility] ORS;  Service: General;  Laterality: N/A;  . INSERTION OF MESH Bilateral 09/23/2012   Procedure: INSERTION OF MESH;  Surgeon: Madilyn Hook, DO;  Location: [facility] ORS;  Service: General;  Laterality: Bilateral;       Family History  Problem Relation Age of Onset  . Diabetes Mother   . Kidney disease Mother   . Stroke Father   . Stroke Brother   . Cancer Sister   . Cancer Sister   . Diabetes Sister   . Healthy Daughter   . Healthy Daughter   . Healthy Son     Social History  Tobacco Use  . Smoking status: Former Smoker    Quit date: 04/02/1991    Years since quitting: 28.4  . Smokeless tobacco: Former Systems developer    Quit date: 08/06/1991  Substance Use Topics  . Alcohol use: No  . Drug use: No    Home Medications Prior to Admission medications   Medication Sig Start Date End Date Taking? Authorizing Provider  Ascorbic Acid (VITAMIN C) 100 MG tablet Take 100 mg by mouth in the morning and at bedtime.    Yes [provider]  aspirin EC 81 MG tablet Take 81 mg by mouth daily.   Yes [provider]  atorvastatin (LIPITOR) 20 MG tablet Take 20 mg by mouth daily.  01/27/13  Yes [provider]  carbidopa-levodopa (SINEMET CR) 50-200 MG tablet Take 1 tablet by mouth at bedtime. 06/21/19  Yes Tat, Eustace Quail, DO  Carbidopa-Levodopa ER (SINEMET CR) 25-100 MG tablet controlled release Take 1 tablet by mouth in the morning, at noon, in the evening, and at bedtime. 08/19/19  Yes Tat, Eustace Quail, DO  Cyanocobalamin (VITAMIN B 12 PO) Take 1 tablet by mouth daily.   Yes [provider]  diazepam (VALIUM) 2 MG tablet Take 1 tablet (2 mg total) by mouth every 6 (six) hours as needed for muscle spasms. 08/27/19  Yes Molpus, Davien, MD  ferrous sulfate 324 MG TBEC Take 324 mg by mouth in the morning and at bedtime.    Yes [provider]  metFORMIN (GLUCOPHAGE) 850 MG tablet Take 850 mg by mouth daily with breakfast.   Yes [provider]  tamsulosin (FLOMAX) 0.4 MG CAPS capsule Take 1 capsule by mouth every evening.  01/27/13  Yes [provider]  escitalopram (LEXAPRO) 10 MG tablet Take 1 tablet (10 mg total) by mouth daily. Patient not taking: Reported on 09/03/2019 01/28/19   Tat, Eustace Quail, DO  ONETOUCH ULTRA test strip  02/04/19   [provider]  pramipexole (MIRAPEX) 0.125 MG tablet Take 0.125 mg by mouth at bedtime.  08/27/19  [provider]    Allergies    Patient has no known allergies.  Review of Systems   Review of Systems  Unable to perform ROS: Mental status change    Physical Exam Updated Vital Signs BP (!) 160/80   Pulse 77   Temp 98.4 F (36.9 C) (Oral)   Resp 18   Ht 6' (1.829 m)   Wt 78 kg   SpO2 96%   BMI 23.33 kg/m   Physical Exam Vitals and nursing note reviewed.  Constitutional:      General: He is not in acute distress.    Appearance: He is well-developed. He is not ill-appearing, toxic-appearing or diaphoretic.  HENT:     Head: Normocephalic.     Comments: Small abrasion vertex of the cranium, without bleeding, drainage or crepitation.    Right Ear: External ear normal.     Left Ear:  External ear normal.     Mouth/Throat:     Mouth: Mucous membranes are moist.     Pharynx: No oropharyngeal exudate or posterior oropharyngeal erythema.  Eyes:     Conjunctiva/sclera: Conjunctivae normal.     Pupils: Pupils are equal, round, and reactive to light.  Neck:     Trachea: Phonation normal.  Cardiovascular:     Rate and Rhythm: Normal rate and regular rhythm.     Heart sounds: Normal heart sounds.  Pulmonary:     Effort: Pulmonary effort  is normal.     Breath sounds: Normal breath sounds.  Abdominal:     General: There is no distension.     Palpations: Abdomen is soft.     Tenderness: There is no abdominal tenderness.  Musculoskeletal:        General: Normal range of motion.     Cervical back: Normal range of motion and neck supple.     Right lower leg: Edema present.     Left lower leg: Edema present.  Skin:    General: Skin is warm and dry.     Coloration: Skin is not jaundiced or pale.  Neurological:     Mental Status: He is alert and oriented to person, place, and time.     Cranial Nerves: No cranial nerve deficit.     Sensory: No sensory deficit.     Motor: No abnormal muscle tone.     Coordination: Coordination normal.  Psychiatric:        Mood and Affect: Mood normal.        Behavior: Behavior normal.     ED Results / Procedures / Treatments   Labs (all labs ordered are listed, but only abnormal results are displayed) Labs Reviewed  URINALYSIS, ROUTINE W REFLEX MICROSCOPIC - Abnormal; Notable for the following components:      Result Value   Specific Gravity, Urine 1.004 (*)    All other components within normal limits  CBC - Abnormal; Notable for the following components:   RBC 4.15 (*)    Hemoglobin 12.9 (*)    All other components within normal limits  COMPREHENSIVE METABOLIC PANEL - Abnormal; Notable for the following components:   Glucose, Bld 129 (*)    Calcium 8.7 (*)    Total Bilirubin 1.3 (*)    All other components within normal  limits    EKG None  Radiology DG Chest 2 View  Result Date: 09/03/2019 CLINICAL DATA:  Chest pain multiple fall EXAM: CHEST - 2 VIEW COMPARISON:  09/18/2012 FINDINGS: The heart size and mediastinal contours are within normal limits. Both lungs are clear. The visualized skeletal structures are unremarkable. IMPRESSION: No active cardiopulmonary disease. Electronically Signed   By: Donavan Foil M.D.   On: 09/03/2019 17:14   DG Pelvis 1-2 Views  Result Date: 09/03/2019 CLINICAL DATA:  Multiple fall EXAM: PELVIS - 1-2 VIEW COMPARISON:  None. FINDINGS: There is no evidence of pelvic fracture or diastasis. No pelvic bone lesions are seen. Clips in the right pelvis. IMPRESSION: Negative. Electronically Signed   By: Donavan Foil M.D.   On: 09/03/2019 17:15   CT Head Wo Contrast  Result Date: 09/03/2019 CLINICAL DATA:  Multiple falls. EXAM: CT HEAD WITHOUT CONTRAST CT CERVICAL SPINE WITHOUT CONTRAST TECHNIQUE: Multidetector CT imaging of the head and cervical spine was performed following the standard protocol without intravenous contrast. Multiplanar CT image reconstructions of the cervical spine were also generated. COMPARISON:  None. FINDINGS: CT HEAD FINDINGS Brain: There is mild cerebral atrophy with widening of the extra-axial spaces and ventricular dilatation. There are areas of decreased attenuation within the white matter tracts of the supratentorial brain, consistent with microvascular disease changes. Vascular: No hyperdense vessel or unexpected calcification. Skull: Normal. Negative for fracture or focal lesion. Sinuses/Orbits: There is marked severity left maxillary sinus mucosal thickening. A small air-fluid level is seen within the right maxillary sinus. Other: None. CT CERVICAL SPINE FINDINGS Alignment: Normal. Skull base and vertebrae: No acute fracture. No primary bone lesion or focal pathologic process.  Soft tissues and spinal canal: No prevertebral fluid or swelling. No visible canal  hematoma. Disc levels: Moderate severity endplate sclerosis is seen at the levels of C3-C4, C5-C6 and C6-C7. Moderate to marked severity intervertebral disc space narrowing is seen at the levels of C3-C4, C5-C6 and C6-C7, with moderate severity intervertebral disc space narrowing seen at the level of C7-T1. Mild to moderate severity bilateral multilevel facet joint hypertrophy is seen. Upper chest: Negative. Other: N/A IMPRESSION: 1. Generalized cerebral atrophy. 2. No acute intracranial abnormality. 3. Marked severity left maxillary sinus mucosal thickening. 4. Moderate to marked severity degenerative changes of the cervical spine without evidence of an acute fracture or subluxation. Electronically Signed   By: Virgina Norfolk M.D.   On: 09/03/2019 17:00   CT Cervical Spine Wo Contrast  Result Date: 09/03/2019 CLINICAL DATA:  Multiple falls. EXAM: CT HEAD WITHOUT CONTRAST CT CERVICAL SPINE WITHOUT CONTRAST TECHNIQUE: Multidetector CT imaging of the head and cervical spine was performed following the standard protocol without intravenous contrast. Multiplanar CT image reconstructions of the cervical spine were also generated. COMPARISON:  None. FINDINGS: CT HEAD FINDINGS Brain: There is mild cerebral atrophy with widening of the extra-axial spaces and ventricular dilatation. There are areas of decreased attenuation within the white matter tracts of the supratentorial brain, consistent with microvascular disease changes. Vascular: No hyperdense vessel or unexpected calcification. Skull: Normal. Negative for fracture or focal lesion. Sinuses/Orbits: There is marked severity left maxillary sinus mucosal thickening. A small air-fluid level is seen within the right maxillary sinus. Other: None. CT CERVICAL SPINE FINDINGS Alignment: Normal. Skull base and vertebrae: No acute fracture. No primary bone lesion or focal pathologic process. Soft tissues and spinal canal: No prevertebral fluid or swelling. No visible canal  hematoma. Disc levels: Moderate severity endplate sclerosis is seen at the levels of C3-C4, C5-C6 and C6-C7. Moderate to marked severity intervertebral disc space narrowing is seen at the levels of C3-C4, C5-C6 and C6-C7, with moderate severity intervertebral disc space narrowing seen at the level of C7-T1. Mild to moderate severity bilateral multilevel facet joint hypertrophy is seen. Upper chest: Negative. Other: N/A IMPRESSION: 1. Generalized cerebral atrophy. 2. No acute intracranial abnormality. 3. Marked severity left maxillary sinus mucosal thickening. 4. Moderate to marked severity degenerative changes of the cervical spine without evidence of an acute fracture or subluxation. Electronically Signed   By: Virgina Norfolk M.D.   On: 09/03/2019 17:02    Procedures Procedures (including critical care time)  Medications Ordered in ED Medications - No data to display  ED Course  I have reviewed the triage vital signs and the nursing notes.  Pertinent labs & imaging results that were available during my care of the patient were reviewed by me and considered in my medical decision making (see chart for details).  Clinical Course as of Sep 02 2012  Fri Sep 03, 2019  1911 8Patient daughter [name] is now in the room, and I had extended conversation with her and the patient's wife, regarding findings, recommendation and plan.  About insomnia, and restlessness at night.  We talked about his insomnia and restlessness at night.  He has tried Tylenol PM without relief.  He wonders about taking 2 at night, as a trial and I agree that that could be beneficial.  The patient also has diazepam that he can take prior to sleep to help keep him relaxed and avoid some of the leg twitching he is having.  He apparently failed on prior treatment for restless  leg syndrome.  We talked about sleep hygiene and the importance of avoiding napping during the day.  [name] and his wife are agreeable to the plan of home health  involvement for additional interventions.  I have ordered home health nursing, aide, PT and social work.   [EW]    Clinical Course User Index [EW] Daleen Bo, MD   MDM Rules/Calculators/A&P                       Patient Vitals for the past 24 hrs:  BP Temp Temp src Pulse Resp SpO2 Height Weight  09/03/19 2000 (!) 160/80 -- -- 77 18 96 % -- --  09/03/19 1930 (!) 143/87 -- -- 76 20 98 % -- --  09/03/19 1830 (!) 170/96 -- -- 82 17 98 % -- --  09/03/19 1804 (!) 161/77 -- -- 72 19 97 % -- --  09/03/19 1800 (!) 161/77 -- -- 74 18 98 % -- --  09/03/19 1729 136/76 -- -- 86 18 95 % -- --  09/03/19 1630 (!) 149/68 -- -- (!) 54 18 98 % -- --  09/03/19 1615 (!) 155/84 -- -- 62 18 98 % -- --  09/03/19 1343 -- -- -- -- -- -- 6' (1.829 m) 78 kg  09/03/19 1341 134/68 98.4 F (36.9 C) Oral 79 16 97 % -- --    8:10 PM Reevaluation with update and discussion. After initial assessment and treatment, an updated evaluation reveals no change in clinical status, findings discussed with patient and family members, all questions answered. Daleen Bo   Medical Decision Making:  This patient is presenting for evaluation of frequent falling, which does require a range of treatment options, and is a complaint that involves a moderate risk of morbidity and mortality. The differential diagnoses include head injury, neck injury, hemodynamic instability, acute infectious process. I decided to review old records, and in summary chronically ill elderly patient with moderate Parkinson's disease..  I obtained additional historical information from wife at bedside.  Clinical Laboratory Tests Ordered, included CBC, Metabolic panel and Urinalysis. Review indicates reassuring labs not indicating acute instability.. Radiologic Tests Ordered, included radiographs pelvis and chest, CTs cervical and head.  I independently Visualized: Radiographic images, which show reassuring findings no fracture pelvis, knee, no  intracranial injury, no cervical spine dislocation or fracture  Cardiac Monitor Tracing which shows normal sinus rhythm    Critical Interventions-clinical evaluation, laboratory testing, CT imaging, observation, discussions with extended family members, reassessment  After These Interventions, the Patient was reevaluated and was found stable for discharge.  Family members are concerned about insomnia, leading to weakness and falling.  Patient has intermittent jerking of his legs which is worse at nighttime.  He naps during the day.  Patient is nontoxic in presentation and ED evaluation is reassuring.  Discussed findings with family members and arrange for additional support at home by home health.  Family members were agreeable.  Patient will be treated with Tylenol PM to assist with sleep, and use diazepam for muscle spasms/jerking and to aid sleeping.  CRITICAL CARE-no Performed by: Daleen Bo  Nursing Notes Reviewed/ Care Coordinated Applicable Imaging Reviewed Interpretation of Laboratory Data incorporated into ED treatment  The patient appears reasonably screened and/or stabilized for discharge and I doubt any other medical condition or other [facility] requiring further screening, evaluation, or treatment in the ED at this time prior to discharge.  Plan: Home Medications-continue usual; Home Treatments-sleep hygiene, regular diet; return here  if the recommended treatment, does not improve the symptoms; Recommended follow up-PCP and neurology follow-up for ongoing management     Final Clinical Impression(s) / ED Diagnoses Final diagnoses:  Fall, initial encounter  Injury of head, initial encounter  Insomnia, unspecified type  Restlessness    Rx / DC Orders ED Discharge Orders    None       Daleen Bo, MD 09/04/19 0945

## 2019-09-03 NOTE — Progress Notes (Signed)
TOC CM sent message to pt's [facility] agency, [facility] to follow up on [facility]. Waiting call back. [place], [place] ED TOC CM [phone]

## 2019-09-03 NOTE — Discharge Instructions (Addendum)
Try taking Tylenol PM, 2 tablets at night to help sleep.  You can also use diazepam, 2 mg at bedtime as a sleep aid.  Be careful because both of these medicines can cause weakness and confusion.  Follow-up with both primary care, and neurology, regarding his difficulties with sleep and medication regimen.  We have contacted home health services to come to your home for additional help and assistance in multiple ways.  Return here, if needed, for problems.

## 2019-09-03 NOTE — ED Notes (Signed)
Patient's daughter at bedside. MD made aware.

## 2019-09-03 NOTE — ED Triage Notes (Addendum)
Patient and patient's wife talked to their PCP yesterday. Patient was told to come to the ED yesterday due to multiple falls, slight confusion, urinary frequency, and ankle swelling. Patient's wife reports that the patient was too weak to get him into a car. Patient takes Aspirin. Patient has a small hematoma to the top of the head, abrasions on his back, soreness of the coccyx.

## 2019-09-03 NOTE — ED Notes (Signed)
Patient asking to be discharged. Informed patient MD would like to speak with their daughter. Daughter called to bedside.

## 2019-09-03 NOTE — Progress Notes (Signed)
CSW spoke to pt's wife who acknowledged that [facility] services haven't begun yet and stated that when D/C'd her daughter is ready outside to p/u the pt.  Pt's spouse voiced understanding that [facility] would be alerted to pt's D/C and should follow up with the pt/pt's wife on Saturday.  Pt's wife was appreciative and thanked the CSW.  RN/RN CM/EDP updated.  Please reconsult if future social work needs arise.  CSW signing off, as social work intervention is no longer needed.  Alphonse Guild. Deshante Cassell  MSW, LCSW, LCAS, CCS Transitions of Care Clinical Social Worker Care Coordination Department Ph: [phone]

## 2019-09-07 DIAGNOSIS — E7800 Pure hypercholesterolemia, unspecified: Secondary | ICD-10-CM | POA: Diagnosis not present

## 2019-09-07 DIAGNOSIS — G2581 Restless legs syndrome: Secondary | ICD-10-CM | POA: Diagnosis not present

## 2019-09-07 DIAGNOSIS — I2510 Atherosclerotic heart disease of native coronary artery without angina pectoris: Secondary | ICD-10-CM | POA: Diagnosis not present

## 2019-09-07 DIAGNOSIS — E559 Vitamin D deficiency, unspecified: Secondary | ICD-10-CM | POA: Diagnosis not present

## 2019-09-07 DIAGNOSIS — Z7984 Long term (current) use of oral hypoglycemic drugs: Secondary | ICD-10-CM | POA: Diagnosis not present

## 2019-09-07 DIAGNOSIS — E538 Deficiency of other specified B group vitamins: Secondary | ICD-10-CM | POA: Diagnosis not present

## 2019-09-07 DIAGNOSIS — E1151 Type 2 diabetes mellitus with diabetic peripheral angiopathy without gangrene: Secondary | ICD-10-CM | POA: Diagnosis not present

## 2019-09-07 DIAGNOSIS — Z9181 History of falling: Secondary | ICD-10-CM | POA: Diagnosis not present

## 2019-09-07 DIAGNOSIS — Z87891 Personal history of nicotine dependence: Secondary | ICD-10-CM | POA: Diagnosis not present

## 2019-09-07 DIAGNOSIS — G20 Parkinson's disease: Secondary | ICD-10-CM | POA: Diagnosis not present

## 2019-09-07 DIAGNOSIS — I10 Essential (primary) hypertension: Secondary | ICD-10-CM | POA: Diagnosis not present

## 2019-09-09 DIAGNOSIS — I2510 Atherosclerotic heart disease of native coronary artery without angina pectoris: Secondary | ICD-10-CM | POA: Diagnosis not present

## 2019-09-09 DIAGNOSIS — Z87891 Personal history of nicotine dependence: Secondary | ICD-10-CM | POA: Diagnosis not present

## 2019-09-09 DIAGNOSIS — Z9181 History of falling: Secondary | ICD-10-CM | POA: Diagnosis not present

## 2019-09-09 DIAGNOSIS — E538 Deficiency of other specified B group vitamins: Secondary | ICD-10-CM | POA: Diagnosis not present

## 2019-09-09 DIAGNOSIS — G2581 Restless legs syndrome: Secondary | ICD-10-CM | POA: Diagnosis not present

## 2019-09-09 DIAGNOSIS — E1151 Type 2 diabetes mellitus with diabetic peripheral angiopathy without gangrene: Secondary | ICD-10-CM | POA: Diagnosis not present

## 2019-09-09 DIAGNOSIS — G20 Parkinson's disease: Secondary | ICD-10-CM | POA: Diagnosis not present

## 2019-09-09 DIAGNOSIS — I10 Essential (primary) hypertension: Secondary | ICD-10-CM | POA: Diagnosis not present

## 2019-09-09 DIAGNOSIS — E7800 Pure hypercholesterolemia, unspecified: Secondary | ICD-10-CM | POA: Diagnosis not present

## 2019-09-09 DIAGNOSIS — E559 Vitamin D deficiency, unspecified: Secondary | ICD-10-CM | POA: Diagnosis not present

## 2019-09-09 DIAGNOSIS — Z7984 Long term (current) use of oral hypoglycemic drugs: Secondary | ICD-10-CM | POA: Diagnosis not present

## 2019-09-14 DIAGNOSIS — R3915 Urgency of urination: Secondary | ICD-10-CM | POA: Diagnosis not present

## 2019-09-15 DIAGNOSIS — E538 Deficiency of other specified B group vitamins: Secondary | ICD-10-CM | POA: Diagnosis not present

## 2019-09-15 DIAGNOSIS — E1151 Type 2 diabetes mellitus with diabetic peripheral angiopathy without gangrene: Secondary | ICD-10-CM | POA: Diagnosis not present

## 2019-09-15 DIAGNOSIS — G20 Parkinson's disease: Secondary | ICD-10-CM | POA: Diagnosis not present

## 2019-09-15 DIAGNOSIS — I10 Essential (primary) hypertension: Secondary | ICD-10-CM | POA: Diagnosis not present

## 2019-09-15 DIAGNOSIS — I2510 Atherosclerotic heart disease of native coronary artery without angina pectoris: Secondary | ICD-10-CM | POA: Diagnosis not present

## 2019-09-15 DIAGNOSIS — Z87891 Personal history of nicotine dependence: Secondary | ICD-10-CM | POA: Diagnosis not present

## 2019-09-15 DIAGNOSIS — E559 Vitamin D deficiency, unspecified: Secondary | ICD-10-CM | POA: Diagnosis not present

## 2019-09-15 DIAGNOSIS — E7800 Pure hypercholesterolemia, unspecified: Secondary | ICD-10-CM | POA: Diagnosis not present

## 2019-09-15 DIAGNOSIS — G2581 Restless legs syndrome: Secondary | ICD-10-CM | POA: Diagnosis not present

## 2019-09-15 DIAGNOSIS — Z9181 History of falling: Secondary | ICD-10-CM | POA: Diagnosis not present

## 2019-09-15 DIAGNOSIS — Z7984 Long term (current) use of oral hypoglycemic drugs: Secondary | ICD-10-CM | POA: Diagnosis not present

## 2019-09-17 DIAGNOSIS — E7800 Pure hypercholesterolemia, unspecified: Secondary | ICD-10-CM | POA: Diagnosis not present

## 2019-09-17 DIAGNOSIS — Z87891 Personal history of nicotine dependence: Secondary | ICD-10-CM | POA: Diagnosis not present

## 2019-09-17 DIAGNOSIS — E1151 Type 2 diabetes mellitus with diabetic peripheral angiopathy without gangrene: Secondary | ICD-10-CM | POA: Diagnosis not present

## 2019-09-17 DIAGNOSIS — Z7984 Long term (current) use of oral hypoglycemic drugs: Secondary | ICD-10-CM | POA: Diagnosis not present

## 2019-09-17 DIAGNOSIS — I2510 Atherosclerotic heart disease of native coronary artery without angina pectoris: Secondary | ICD-10-CM | POA: Diagnosis not present

## 2019-09-17 DIAGNOSIS — G2581 Restless legs syndrome: Secondary | ICD-10-CM | POA: Diagnosis not present

## 2019-09-17 DIAGNOSIS — I10 Essential (primary) hypertension: Secondary | ICD-10-CM | POA: Diagnosis not present

## 2019-09-17 DIAGNOSIS — G20 Parkinson's disease: Secondary | ICD-10-CM | POA: Diagnosis not present

## 2019-09-17 DIAGNOSIS — E538 Deficiency of other specified B group vitamins: Secondary | ICD-10-CM | POA: Diagnosis not present

## 2019-09-17 DIAGNOSIS — Z9181 History of falling: Secondary | ICD-10-CM | POA: Diagnosis not present

## 2019-09-17 DIAGNOSIS — E559 Vitamin D deficiency, unspecified: Secondary | ICD-10-CM | POA: Diagnosis not present

## 2019-09-22 ENCOUNTER — Encounter (HOSPITAL_COMMUNITY): Payer: Medicare Other

## 2019-09-22 DIAGNOSIS — I10 Essential (primary) hypertension: Secondary | ICD-10-CM | POA: Diagnosis not present

## 2019-09-22 DIAGNOSIS — E538 Deficiency of other specified B group vitamins: Secondary | ICD-10-CM | POA: Diagnosis not present

## 2019-09-22 DIAGNOSIS — G20 Parkinson's disease: Secondary | ICD-10-CM | POA: Diagnosis not present

## 2019-09-22 DIAGNOSIS — E7800 Pure hypercholesterolemia, unspecified: Secondary | ICD-10-CM | POA: Diagnosis not present

## 2019-09-22 DIAGNOSIS — I2510 Atherosclerotic heart disease of native coronary artery without angina pectoris: Secondary | ICD-10-CM | POA: Diagnosis not present

## 2019-09-22 DIAGNOSIS — Z9181 History of falling: Secondary | ICD-10-CM | POA: Diagnosis not present

## 2019-09-22 DIAGNOSIS — E1151 Type 2 diabetes mellitus with diabetic peripheral angiopathy without gangrene: Secondary | ICD-10-CM | POA: Diagnosis not present

## 2019-09-22 DIAGNOSIS — Z87891 Personal history of nicotine dependence: Secondary | ICD-10-CM | POA: Diagnosis not present

## 2019-09-22 DIAGNOSIS — E559 Vitamin D deficiency, unspecified: Secondary | ICD-10-CM | POA: Diagnosis not present

## 2019-09-22 DIAGNOSIS — Z7984 Long term (current) use of oral hypoglycemic drugs: Secondary | ICD-10-CM | POA: Diagnosis not present

## 2019-09-22 DIAGNOSIS — G2581 Restless legs syndrome: Secondary | ICD-10-CM | POA: Diagnosis not present

## 2019-09-27 DIAGNOSIS — E559 Vitamin D deficiency, unspecified: Secondary | ICD-10-CM | POA: Diagnosis not present

## 2019-09-27 DIAGNOSIS — G20 Parkinson's disease: Secondary | ICD-10-CM | POA: Diagnosis not present

## 2019-09-27 DIAGNOSIS — E538 Deficiency of other specified B group vitamins: Secondary | ICD-10-CM | POA: Diagnosis not present

## 2019-09-27 DIAGNOSIS — Z7984 Long term (current) use of oral hypoglycemic drugs: Secondary | ICD-10-CM | POA: Diagnosis not present

## 2019-09-27 DIAGNOSIS — Z9181 History of falling: Secondary | ICD-10-CM | POA: Diagnosis not present

## 2019-09-27 DIAGNOSIS — E7800 Pure hypercholesterolemia, unspecified: Secondary | ICD-10-CM | POA: Diagnosis not present

## 2019-09-27 DIAGNOSIS — G2581 Restless legs syndrome: Secondary | ICD-10-CM | POA: Diagnosis not present

## 2019-09-27 DIAGNOSIS — E1151 Type 2 diabetes mellitus with diabetic peripheral angiopathy without gangrene: Secondary | ICD-10-CM | POA: Diagnosis not present

## 2019-09-27 DIAGNOSIS — I10 Essential (primary) hypertension: Secondary | ICD-10-CM | POA: Diagnosis not present

## 2019-09-27 DIAGNOSIS — Z87891 Personal history of nicotine dependence: Secondary | ICD-10-CM | POA: Diagnosis not present

## 2019-09-27 DIAGNOSIS — I2510 Atherosclerotic heart disease of native coronary artery without angina pectoris: Secondary | ICD-10-CM | POA: Diagnosis not present

## 2019-10-05 ENCOUNTER — Telehealth: Payer: Self-pay | Admitting: Neurology

## 2019-10-05 NOTE — Telephone Encounter (Signed)
As I mentioned to her sister last week (see emails), this needs to be addressed at an in person appt as I can't do this complex stuff over the phone or via email.  Make sure that they are using their PCP appropriately as well.  Also, there is a cancellation for tomorrow.  Tell them we could see them for a visit but make sure that they have what they need addressed down and focused so that it can be addressed as we can get to the things that are most important for them.

## 2019-10-05 NOTE — Telephone Encounter (Signed)
Patient's daughter [name] called in wanting some advice on what they should do for the patient until his next appointment on 11/30/19. The patient has not been sleeping but maybe 2 hours a night and has been having multiple hallucinations.

## 2019-10-05 NOTE — Progress Notes (Signed)
Assessment/Plan:   1.  Parkinsons Disease  -Continue carbidopa/levodopa 25/100 CR, 1 tablet 4 times per day  2.  GAD/depression  -Refuses Lexapro  3.  B12 deficiency  -takes B12 supplement  4.  RLS  -Ferrous sulfate, 325 mg twice per day with vitamin C supplement, 100 to 200 mg.  -Asked daughter to make sure that he was on the supplements.  This was not on his list, but he thinks he is on it (likely is as he was reporting some dark stool)  5.  Insomnia/hallucinations  -discussed quetiapine.  Discussed that this could help sleep and hallucinations.  We did talk about the fact that the atypical antipsychotic medications are not indicated for dementia related psychosis and increase risk of mortality in the elderly, usually because of infectious or  cardiac related etiologies.  We discussed prolongation of the QT interval and what that means.  Last EKG was reviewed and was done on July 21, 2019.  QT/QTc was very normal at that time.  We discussed the black box warning in detail and how it applies in this case.  Family believes that QOL is important and they have tried nonpharmacologic therapies (maintaining schedule, attempting proper sleep hydration, redirecting, etc) without success.  They would like to continue to caregive in the home and believe that this is the only way to continue to do so.  After this long discussion, family decided to try the medication as they feel that the benefits outweigh the risks in this case.  Patient was agreeable as well and he was alert and oriented today.  We will just start with 12.5 mg at bedtime.  I told him to give it 2 weeks and see how he does and then if they need it for agitation/hallucinations in the day, they can use an additional 12.5 mg as needed.  6.  Leg pain  -Discussed with patient/daughter that I do not think that leg pain is the same as restless leg.  Restless leg is not painful.  Parkinson's disease also is generally not painful.  Asked him  to follow-up with primary care in that regard.  Subjective:   Rodney Love was seen today in follow up for Parkinsons disease.  My previous records were reviewed prior to todays visit as well as outside records available to me. Pt worked in for an appointment today after emails and phone calls from family.  daughter Rodney Love present and supplements hx.  Patient was last seen via video in May.  At that point in time, we stopped his immediate release levodopa and started him on extended release levodopa.  We also discontinued his pramipexole which was started by another provider for restless leg.  We recommended that he start ferrous sulfate with vitamin C consistently.  He thinks that he is on that but its not on his list.  We also recommended to follow-up with primary care.  I have not gotten any correspondences regarding that from his primary care.  He has been to the ER several times and I have reviewed those notes.  As above, his daughter has also emailed me about the fact that the patient gets angry.  He has not slept well.  He has had several falls.  He has had hallucinations.  He will see people in the house who were not there or a puppy in the crate with his dog.  Generally, if he gets better sleep, he has better days.  He may think that there  are more than one of his wife.  hallucinations happen daily.  He was checked for UTI and that was negative per daughter.  Daughter states that today is a good day where he is able to get up but Saturday was not.  He did well last night with sleep.    Current prescribed movement disorder medications: Carbidopa/levodopa 25/100 CR, 1 tablet 4 times per day (6am/10am/2pm/6pm) Carbidopa/levodopa 50/200 at bedtime B12 supplement    PREVIOUS MEDICATIONS: Carbidopa/levodopa 25/100 IR; pramipexole  ALLERGIES:  No Known Allergies  CURRENT MEDICATIONS:  Outpatient Encounter Medications as of 10/06/2019  Medication Sig  . Ascorbic Acid (VITAMIN C) 100 MG tablet Take  100 mg by mouth in the morning and at bedtime.   Marland Kitchen aspirin EC 81 MG tablet Take 81 mg by mouth daily.  Marland Kitchen atorvastatin (LIPITOR) 20 MG tablet Take 20 mg by mouth daily.   . carbidopa-levodopa (SINEMET CR) 50-200 MG tablet Take 1 tablet by mouth at bedtime.  . Carbidopa-Levodopa ER (SINEMET CR) 25-100 MG tablet controlled release Take 1 tablet by mouth in the morning, at noon, in the evening, and at bedtime.  . Cyanocobalamin (VITAMIN B 12 PO) Take 1 tablet by mouth daily.  . ferrous sulfate 324 MG TBEC Take 324 mg by mouth in the morning and at bedtime.   . metFORMIN (GLUCOPHAGE) 850 MG tablet Take 850 mg by mouth daily with breakfast.  . ONETOUCH ULTRA test strip   . tamsulosin (FLOMAX) 0.4 MG CAPS capsule Take 1 capsule by mouth every evening.   . [DISCONTINUED] diazepam (VALIUM) 2 MG tablet Take 1 tablet (2 mg total) by mouth every 6 (six) hours as needed for muscle spasms. (Patient not taking: Reported on 10/06/2019)  . [DISCONTINUED] escitalopram (LEXAPRO) 10 MG tablet Take 1 tablet (10 mg total) by mouth daily. (Patient not taking: Reported on 09/03/2019)  . [DISCONTINUED] pramipexole (MIRAPEX) 0.125 MG tablet Take 0.125 mg by mouth at bedtime.   No facility-administered encounter medications on file as of 10/06/2019.    Objective:   PHYSICAL EXAMINATION:    VITALS:   Vitals:   10/06/19 0935  BP: 113/65  Pulse: 77  SpO2: 97%  Weight: 170 lb (77.1 kg)  Height: 6' (1.829 m)    GEN:  The patient appears stated age and is in NAD. HEENT:  Normocephalic, atraumatic.  The mucous membranes are moist. The superficial temporal arteries are without ropiness or tenderness. CV:  RRR Lungs:  CTAB Neck/HEME:  There are no carotid bruits bilaterally.  Neurological examination:  Orientation: The patient is alert and oriented x3. Cranial nerves: There is good facial symmetry with facial hypomimia. The speech is fluent and clear. Soft palate rises symmetrically and there is no tongue deviation.  Hearing is intact to conversational tone. Sensation: Sensation is intact to light touch throughout Motor: Strength is at least antigravity x4.  Movement examination: Tone: There is normal tone in the upper and lower extremity Abnormal movements: there is LUE rest tremor, rare Coordination: Patient is slow with all rapid alternating movements. Gait and Station: The patient requires assistance out of the wheelchair.  He is given a walker.  He actually walks very well in the hall today with his walker.  I have reviewed and interpreted the following labs independently    Chemistry      Component Value Date/Time   NA 141 09/03/2019 1531   K 3.8 09/03/2019 1531   CL 107 09/03/2019 1531   CO2 26 09/03/2019 1531   BUN  14 09/03/2019 1531   CREATININE 0.73 09/03/2019 1531      Component Value Date/Time   CALCIUM 8.7 (L) 09/03/2019 1531   ALKPHOS 79 09/03/2019 1531   AST 15 09/03/2019 1531   ALT 10 09/03/2019 1531   BILITOT 1.3 (H) 09/03/2019 1531       Lab Results  Component Value Date   WBC 7.6 09/03/2019   HGB 12.9 (L) 09/03/2019   HCT 40.2 09/03/2019   MCV 96.9 09/03/2019   PLT 245 09/03/2019    No results found for: TSH   Total time spent on today's visit was 40 minutes, including both face-to-face time and nonface-to-face time.  Time included that spent on review of records (prior notes available to me/labs/imaging if pertinent), discussing treatment and goals, answering patient's questions and coordinating care.  Cc:  [name], MD

## 2019-10-05 NOTE — Telephone Encounter (Signed)
Spoke with [name] and informed her that we can not treat over the phone and complex question swill have to be addressed over the phone. She voiced understanding and stated the reason for her call was to get her dad a sooner appt.   Agreeable with am. ppt for tomorrow with Dr Tat at 9:45

## 2019-10-06 ENCOUNTER — Ambulatory Visit: Payer: Medicare Other | Admitting: Neurology

## 2019-10-06 ENCOUNTER — Encounter: Payer: Self-pay | Admitting: Neurology

## 2019-10-06 ENCOUNTER — Other Ambulatory Visit: Payer: Self-pay

## 2019-10-06 VITALS — BP 113/65 | HR 77 | Ht 72.0 in | Wt 170.0 lb

## 2019-10-06 DIAGNOSIS — G20 Parkinson's disease: Secondary | ICD-10-CM

## 2019-10-06 DIAGNOSIS — M79605 Pain in left leg: Secondary | ICD-10-CM

## 2019-10-06 DIAGNOSIS — M79604 Pain in right leg: Secondary | ICD-10-CM | POA: Diagnosis not present

## 2019-10-06 DIAGNOSIS — R441 Visual hallucinations: Secondary | ICD-10-CM | POA: Diagnosis not present

## 2019-10-06 MED ORDER — QUETIAPINE FUMARATE 25 MG PO TABS: ORAL_TABLET | ORAL | 1 refills | Status: DC

## 2019-10-06 NOTE — Patient Instructions (Addendum)
1.  Start quetiapine 25 mg, 1/2 tablet at bedtime.  Give that several weeks to see how you do.  After that, if there is agitation, confusion, hallucinations, you can add an additional 1/2 tablet in the day as needed.    The physicians and staff at [facility] are committed to providing excellent care. You may receive a survey requesting feedback about your experience at our office. We strive to receive "very good" responses to the survey questions. If you feel that your experience would prevent you from giving the office a "very good " response, please contact our office to try to remedy the situation. We may be reached at [phone]. Thank you for taking the time out of your busy day to complete the survey.

## 2019-10-08 DIAGNOSIS — G2581 Restless legs syndrome: Secondary | ICD-10-CM | POA: Diagnosis not present

## 2019-10-08 DIAGNOSIS — I10 Essential (primary) hypertension: Secondary | ICD-10-CM | POA: Diagnosis not present

## 2019-10-08 DIAGNOSIS — I2510 Atherosclerotic heart disease of native coronary artery without angina pectoris: Secondary | ICD-10-CM | POA: Diagnosis not present

## 2019-10-08 DIAGNOSIS — G20 Parkinson's disease: Secondary | ICD-10-CM | POA: Diagnosis not present

## 2019-10-08 DIAGNOSIS — E7800 Pure hypercholesterolemia, unspecified: Secondary | ICD-10-CM | POA: Diagnosis not present

## 2019-10-08 DIAGNOSIS — Z87891 Personal history of nicotine dependence: Secondary | ICD-10-CM | POA: Diagnosis not present

## 2019-10-08 DIAGNOSIS — E559 Vitamin D deficiency, unspecified: Secondary | ICD-10-CM | POA: Diagnosis not present

## 2019-10-08 DIAGNOSIS — Z7984 Long term (current) use of oral hypoglycemic drugs: Secondary | ICD-10-CM | POA: Diagnosis not present

## 2019-10-08 DIAGNOSIS — E538 Deficiency of other specified B group vitamins: Secondary | ICD-10-CM | POA: Diagnosis not present

## 2019-10-08 DIAGNOSIS — E1151 Type 2 diabetes mellitus with diabetic peripheral angiopathy without gangrene: Secondary | ICD-10-CM | POA: Diagnosis not present

## 2019-10-08 DIAGNOSIS — Z9181 History of falling: Secondary | ICD-10-CM | POA: Diagnosis not present

## 2019-10-15 ENCOUNTER — Ambulatory Visit (HOSPITAL_COMMUNITY)
Admission: RE | Admit: 2019-10-15 | Discharge: 2019-10-15 | Disposition: A | Discharge status: Home / Self Care | Payer: Medicare Other | Source: Ambulatory Visit | Attending: Cardiology | Admitting: Cardiology

## 2019-10-15 ENCOUNTER — Other Ambulatory Visit: Payer: Self-pay

## 2019-10-15 ENCOUNTER — Encounter: Payer: Self-pay | Admitting: Cardiovascular Disease

## 2019-10-15 ENCOUNTER — Ambulatory Visit: Payer: Medicare Other | Admitting: Cardiovascular Disease

## 2019-10-15 VITALS — BP 129/78 | HR 98 | Ht 72.0 in | Wt 166.2 lb

## 2019-10-15 DIAGNOSIS — I739 Peripheral vascular disease, unspecified: Secondary | ICD-10-CM | POA: Diagnosis not present

## 2019-10-15 DIAGNOSIS — I10 Essential (primary) hypertension: Secondary | ICD-10-CM

## 2019-10-15 DIAGNOSIS — E785 Hyperlipidemia, unspecified: Secondary | ICD-10-CM

## 2019-10-15 DIAGNOSIS — I2510 Atherosclerotic heart disease of native coronary artery without angina pectoris: Secondary | ICD-10-CM

## 2019-10-15 DIAGNOSIS — E119 Type 2 diabetes mellitus without complications: Secondary | ICD-10-CM

## 2019-10-15 DIAGNOSIS — I6521 Occlusion and stenosis of right carotid artery: Secondary | ICD-10-CM

## 2019-10-15 NOTE — Progress Notes (Signed)
Cardiology Office Note   Date:  10/17/2019   ID:  Rodney Love, DOB 1945-11-25, MRN [id number]   PCP:  Rodney Pretty, MD  Cardiologist:  Rodney Love Electrophysiologist:  None   Evaluation Performed:  Follow-Up Visit  Chief Complaint:  PAD/Carotid disease  History of Present Illness:    Rodney Love is a 74 y.o. male with essential hypertension, diabetes mellitus, dyslipidemia and widespread atherosclerosis (occlusion of the right carotid, bilateral iliac stenosis, nonobstructive coronary artery disease) and Parkinson's disease.  He has not had any cardiovascular complaints.  Activity is severely limited by Parkinson's disease.  The patient specifically denies any chest pain at rest exertion, dyspnea at rest or with exertion, orthopnea, paroxysmal nocturnal dyspnea, syncope, palpitations, focal neurological deficits, intermittent claudication, lower extremity edema, unexplained weight gain, cough, hemoptysis or wheezing.   Over the last couple of years his health complaints have been dominated by limitations related to Parkinson's disease.  This has drastically reduced his physical activity.  Some days are better than others.  Sometimes has difficulty getting out of a chair.  He has had 2 falls, 1 of them with head impact leading to an emergency room evaluation in February of this year.  His metabolic parameters have been very well controlled with a recent hemoglobin A1c of 6% and LDL of 50.   Past Medical History:  Diagnosis Date  . Arthritis   . Constipation    OCCASIONAL  . Coronary artery disease   . Diabetes mellitus without complication ([place])   . Environmental allergies   . Hyperlipidemia   . Hypertension   . Inguinal hernia   . Iron deficiency   . Nocturia   . Parkinson disease ([place])   . Peripheral arterial disease [facility])    Past Surgical History:  Procedure Laterality Date  . BACK SURGERY    . CARDIAC CATHETERIZATION  05/29/2006   single vessel CAD w/moderate  stenosis of the prox. RCA approx 40%,prox. LAD 20-30%,minor irreg. mid abdominal aorta,left iliac artery disease 50-60%  . CATARACT EXTRACTION Left 12/2016  . HERNIA REPAIR  [id number]  . INGUINAL HERNIA REPAIR N/A 09/23/2012   Procedure: LAPAROSCOPIC INGUINAL HERNIA with umbilical hernia;  Surgeon: Madilyn Hook, DO;  Location: [facility];  Service: General;  Laterality: N/A;  . INSERTION OF MESH Bilateral 09/23/2012   Procedure: INSERTION OF MESH;  Surgeon: Madilyn Hook, DO;  Location: [facility];  Service: General;  Laterality: Bilateral;     Current Meds  Medication Sig  . Ascorbic Acid (VITAMIN C) 100 MG tablet Take 100 mg by mouth in the morning and at bedtime.   Marland Kitchen aspirin EC 81 MG tablet Take 81 mg by mouth daily.  Marland Kitchen atorvastatin (LIPITOR) 20 MG tablet Take 20 mg by mouth daily.   . carbidopa-levodopa (SINEMET CR) 50-200 MG tablet Take 1 tablet by mouth at bedtime.  . Carbidopa-Levodopa ER (SINEMET CR) 25-100 MG tablet controlled release Take 1 tablet by mouth in the morning, at noon, in the evening, and at bedtime.  . Cyanocobalamin (VITAMIN B 12 PO) Take 1 tablet by mouth daily.  . ferrous sulfate 324 MG TBEC Take 324 mg by mouth in the morning and at bedtime.   . metFORMIN (GLUCOPHAGE) 850 MG tablet Take 850 mg by mouth daily with breakfast.  . ONETOUCH ULTRA test strip   . QUEtiapine (SEROQUEL) 25 MG tablet 1/2 tablet at bed as needed.  May take additional 1/2 in the day prn  . tamsulosin (FLOMAX) 0.4 MG CAPS capsule  Take 1 capsule by mouth every evening.   . [DISCONTINUED] pramipexole (MIRAPEX) 0.125 MG tablet Take 0.125 mg by mouth at bedtime.     Allergies:   Patient has no known allergies.   Social History   Tobacco Use  . Smoking status: Former Smoker    Quit date: 04/02/1991    Years since quitting: 28.5  . Smokeless tobacco: Former Systems developer    Quit date: 08/06/1991  Vaping Use  . Vaping Use: Never used  Substance Use Topics  . Alcohol use: No  . Drug use: No     Family Hx: The  patient's family history includes Cancer in his sister and sister; Diabetes in his mother and sister; Healthy in his daughter, daughter, and son; Kidney disease in his mother; Stroke in his brother and father.  ROS:   Please see the history of present illness.    All other systems are reviewed and are negative.   Prior CV studies:   The following studies were reviewed today:  09/22/2018 carotid ultrasound with 100% occlusion right carotid, mild plaque left carotid 10/15/2019 unchanged 06/24/2019 aortoiliac ultrasound with greater than 50% right external iliac stenosis, greater than 50% left external iliac stenosis site of previous angioplasty, but acceptable ABI right 0.92 (0.98 previously), left 0.80 (0.84 previously).  Labs/Other Tests and Data Reviewed:    EKG: Ordered today shows sinus rhythm with single PVC, otherwise normal tracing.  Normal QTC 438 ms  Recent Labs: July 2020 hemoglobin A1c 6.4%, creatinine 1.05, normal electrolytes and liver function tests 03/16/2019 hemoglobin A1c 6% 06/10/2019 TSH 4.73 09/03/2019 hemoglobin 12.9, creatinine 0.73, normal liver function tests Recent Lipid Panel July 2020 total cholesterol 100, HDL 35, LDL 51, triglycerides 70 07/13/2019 total cholesterol 92, triglycerides 49 Wt Readings from Last 3 Encounters:  10/15/19 166 lb 3.2 oz (75.4 kg)  10/06/19 170 lb (77.1 kg)  09/03/19 172 lb (78 kg)     Objective:    Vital Signs:  BP 129/78   Pulse 98   Ht 6' (1.829 m)   Wt 166 lb 3.2 oz (75.4 kg)   SpO2 99%   BMI 22.54 kg/m     General: Alert, oriented x3, no distress, appears aged compared to his last appointment.  Bent over and less interactive Head: no evidence of trauma, PERRL, EOMI, no exophtalmos or lid lag, no myxedema, no xanthelasma; normal ears, nose and oropharynx Neck: normal jugular venous pulsations and no hepatojugular reflux; brisk carotid pulses without delay and no carotid bruits Chest: clear to auscultation, no signs of  consolidation by percussion or palpation, normal fremitus, symmetrical and full respiratory excursions Cardiovascular: normal position and quality of the apical impulse, regular rhythm, normal first and second heart sounds, no murmurs, rubs or gallops Abdomen: no tenderness or distention, no masses by palpation, no abnormal pulsatility or arterial bruits, normal bowel sounds, no hepatosplenomegaly Extremities: no clubbing, cyanosis or edema; 2+ radial, ulnar and brachial pulses bilaterally; 2+ right femoral, posterior tibial and dorsalis pedis pulses; 2+ left femoral, posterior tibial and dorsalis pedis pulses; no subclavian or femoral bruits Neurological: Resting tremor in hands, a little more obvious on the left, slow changes in facial expression Psych: Normal mood and affect   ASSESSMENT & PLAN:    1. PAD: Asymptomatic, although claudication could be masked by his inability to walk fast 2. 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  No symptoms to suggest interval TIA or CVA. 3. CAD: Denies  exertional angina or dyspnea or any symptoms at rest. 4. HLP: Low HDL has been a chronic problem, but LDL is excellent. 5. DM: Well-controlled on metformin monotherapy 6. HTN: Normal blood pressure, now without medications.  At risk for orthostatic hypotension due to his underlying neurological condition and treatment.  Stand up and change position cautiously.  Medication Adjustments/Labs and Tests Ordered: Current medicines are reviewed at length with the patient today.  Concerns regarding medicines are outlined above.   Tests Ordered: Orders Placed This Encounter  Procedures  . EKG 12-Lead    Medication Changes: No orders of the defined types were placed in this encounter.   Follow Up:  In Person 1 year  Signed, Sanda Klein, MD  10/17/2019 4:29 PM    [place]

## 2019-10-15 NOTE — Patient Instructions (Signed)

## 2019-10-18 ENCOUNTER — Other Ambulatory Visit: Payer: Self-pay | Admitting: *Deleted

## 2019-10-18 DIAGNOSIS — I6521 Occlusion and stenosis of right carotid artery: Secondary | ICD-10-CM

## 2019-10-22 ENCOUNTER — Encounter: Payer: Self-pay | Admitting: *Deleted

## 2019-11-08 ENCOUNTER — Telehealth: Payer: Self-pay

## 2019-11-08 NOTE — Telephone Encounter (Signed)
Patients daughter, [name] notified and voiced understanding.

## 2019-11-08 NOTE — Telephone Encounter (Signed)
If the fecal incontinence is new (and not chronic), they should go to the ER.  If not, they should follow up with the PCP.  Re: red , swollen leg.  If acute, go to ER.  If not, f/u with PCP.  This is not related to Parkinsons Disease.

## 2019-11-08 NOTE — Telephone Encounter (Signed)
Spoke with daughter, [name], who stated the patient started off with fecal incompetence. And its getting worse and the wife is having to clean feces off the patients back. The patients right ankle, foot and leg is stiff, red and swollen. Daughter states the pcp is horrible. She wanted to know if any if this was related to parkinson's. I advised [name] that she should contact the patients pcp. She starts crying and saying she just wants to help her dad and he wont change pcp. Advised [name] that I would speak with Dr Tat and get back to her. She voiced understanding.

## 2019-11-11 ENCOUNTER — Encounter (HOSPITAL_COMMUNITY): Payer: Self-pay

## 2019-11-11 ENCOUNTER — Emergency Department (HOSPITAL_COMMUNITY): Payer: Medicare Other

## 2019-11-11 ENCOUNTER — Emergency Department (HOSPITAL_COMMUNITY)
Admission: EM | Admit: 2019-11-11 | Discharge: 2019-11-11 | Disposition: A | Discharge status: Home / Self Care | Payer: Medicare Other | Attending: Emergency Medicine | Admitting: Emergency Medicine

## 2019-11-11 DIAGNOSIS — Z743 Need for continuous supervision: Secondary | ICD-10-CM | POA: Diagnosis not present

## 2019-11-11 DIAGNOSIS — S0990XA Unspecified injury of head, initial encounter: Secondary | ICD-10-CM | POA: Diagnosis not present

## 2019-11-11 DIAGNOSIS — R55 Syncope and collapse: Secondary | ICD-10-CM | POA: Diagnosis not present

## 2019-11-11 DIAGNOSIS — W19XXXA Unspecified fall, initial encounter: Secondary | ICD-10-CM | POA: Diagnosis not present

## 2019-11-11 DIAGNOSIS — I2510 Atherosclerotic heart disease of native coronary artery without angina pectoris: Secondary | ICD-10-CM | POA: Insufficient documentation

## 2019-11-11 DIAGNOSIS — Z7982 Long term (current) use of aspirin: Secondary | ICD-10-CM | POA: Diagnosis not present

## 2019-11-11 DIAGNOSIS — I6529 Occlusion and stenosis of unspecified carotid artery: Secondary | ICD-10-CM | POA: Diagnosis not present

## 2019-11-11 DIAGNOSIS — Z87891 Personal history of nicotine dependence: Secondary | ICD-10-CM | POA: Diagnosis not present

## 2019-11-11 DIAGNOSIS — I10 Essential (primary) hypertension: Secondary | ICD-10-CM | POA: Diagnosis not present

## 2019-11-11 DIAGNOSIS — R404 Transient alteration of awareness: Secondary | ICD-10-CM | POA: Diagnosis not present

## 2019-11-11 DIAGNOSIS — G20 Parkinson's disease: Secondary | ICD-10-CM | POA: Insufficient documentation

## 2019-11-11 DIAGNOSIS — E119 Type 2 diabetes mellitus without complications: Secondary | ICD-10-CM | POA: Diagnosis not present

## 2019-11-11 DIAGNOSIS — S199XXA Unspecified injury of neck, initial encounter: Secondary | ICD-10-CM | POA: Diagnosis not present

## 2019-11-11 DIAGNOSIS — I7090 Unspecified atherosclerosis: Secondary | ICD-10-CM | POA: Diagnosis not present

## 2019-11-11 DIAGNOSIS — Z7984 Long term (current) use of oral hypoglycemic drugs: Secondary | ICD-10-CM | POA: Diagnosis not present

## 2019-11-11 DIAGNOSIS — Z79899 Other long term (current) drug therapy: Secondary | ICD-10-CM | POA: Insufficient documentation

## 2019-11-11 DIAGNOSIS — M79603 Pain in arm, unspecified: Secondary | ICD-10-CM | POA: Diagnosis not present

## 2019-11-11 LAB — CBC
HCT: 40.8 % (ref 39.0–52.0)
Hemoglobin: 13.2 g/dL (ref 13.0–17.0)
MCH: 31.7 pg (ref 26.0–34.0)
MCHC: 32.4 g/dL (ref 30.0–36.0)
MCV: 97.8 fL (ref 80.0–100.0)
Platelets: 180 10*3/uL (ref 150–400)
RBC: 4.17 MIL/uL — ABNORMAL LOW (ref 4.22–5.81)
RDW: 13.5 % (ref 11.5–15.5)
WBC: 5.9 10*3/uL (ref 4.0–10.5)
nRBC: 0 % (ref 0.0–0.2)

## 2019-11-11 LAB — URINALYSIS, ROUTINE W REFLEX MICROSCOPIC
Bilirubin Urine: NEGATIVE
Glucose, UA: NEGATIVE mg/dL
Hgb urine dipstick: NEGATIVE
Ketones, ur: 5 mg/dL — AB
Leukocytes,Ua: NEGATIVE
Nitrite: NEGATIVE
Protein, ur: NEGATIVE mg/dL
Specific Gravity, Urine: 1.005 (ref 1.005–1.030)
pH: 8 (ref 5.0–8.0)

## 2019-11-11 LAB — BASIC METABOLIC PANEL
Anion gap: 7 (ref 5–15)
BUN: 17 mg/dL (ref 8–23)
CO2: 27 mmol/L (ref 22–32)
Calcium: 8.9 mg/dL (ref 8.9–10.3)
Chloride: 106 mmol/L (ref 98–111)
Creatinine, Ser: 0.73 mg/dL (ref 0.61–1.24)
GFR calc Af Amer: >60 mL/min (ref >60)
GFR calc non Af Amer: >60 mL/min (ref >60)
Glucose, Bld: 119 mg/dL — ABNORMAL HIGH (ref 70–99)
Potassium: 3.5 mmol/L (ref 3.5–5.1)
Sodium: 140 mmol/L (ref 135–145)

## 2019-11-11 LAB — CBG MONITORING, ED: Glucose-Capillary: 106 mg/dL — ABNORMAL HIGH (ref 70–99)

## 2019-11-11 NOTE — ED Provider Notes (Signed)
[facility] DEPT Provider Note   CSN: [id number] Arrival date & time: 11/11/19  0946     History Chief Complaint  Patient presents with  . Loss of Consciousness    Rodney Love is a 74 y.o. male.  HPI Patient was at home with his wife when he suddenly fell, and feels like he blacked out. His wife was nearby and heard him fall, and thinks he may have lost consciousness. He presents by EMS, for evaluation. He did not receive any interventions by them. He is with his daughter who gives most of the history. The patient has had falls in the past related to Parkinson's disease, but this episode felt different. He had a similar episode, 2 weeks ago when [place] the mountains. There time his daughter was with him and saw the incident. She feels like he "blacked out, for 2 minutes at that time." She checked his pulse and feels like it was low, 50, immediately after the incident. He did not seek medical care for that event. He has been otherwise well, taking his usual medications. He has not been ill recently. He has had his Covid vaccines. His tetanus status is up-to-date. There are no other known modifying factors.    Past Medical History:  Diagnosis Date  . Arthritis   . Constipation    OCCASIONAL  . Coronary artery disease   . Diabetes mellitus without complication ([place])   . Environmental allergies   . Hyperlipidemia   . Hypertension   . Inguinal hernia   . Iron deficiency   . Nocturia   . Parkinson disease ([place])   . Peripheral arterial disease [facility])     Patient Active Problem List   Diagnosis Date Noted  . PD (Parkinson's disease) ([place]) 12/18/2015  . Tremor of both hands 11/22/2015  . PAD (peripheral artery disease) s/p remote left external iliac artery angioplasty 10/28/2012  . Coronary atherosclerosis 10/28/2012  . Hypercholesterolemia 10/28/2012  . Essential hypertension 10/28/2012  . Diabetes mellitus type 2 in nonobese ([place])  10/28/2012  . Carotid occlusion, right 10/28/2012    Past Surgical History:  Procedure Laterality Date  . BACK SURGERY    . CARDIAC CATHETERIZATION  05/29/2006   single vessel CAD w/moderate stenosis of the prox. RCA approx 40%,prox. LAD 20-30%,minor irreg. mid abdominal aorta,left iliac artery disease 50-60%  . CATARACT EXTRACTION Left 12/2016  . HERNIA REPAIR  [id number]  . INGUINAL HERNIA REPAIR N/A 09/23/2012   Procedure: LAPAROSCOPIC INGUINAL HERNIA with umbilical hernia;  Surgeon: Madilyn Hook, DO;  Location: [facility] ORS;  Service: General;  Laterality: N/A;  . INSERTION OF MESH Bilateral 09/23/2012   Procedure: INSERTION OF MESH;  Surgeon: Madilyn Hook, DO;  Location: [facility] ORS;  Service: General;  Laterality: Bilateral;       Family History  Problem Relation Age of Onset  . Diabetes Mother   . Kidney disease Mother   . Stroke Father   . Stroke Brother   . Cancer Sister   . Cancer Sister   . Diabetes Sister   . Healthy Daughter   . Healthy Daughter   . Healthy Son     Social History   Tobacco Use  . Smoking status: Former Smoker    Quit date: 04/02/1991    Years since quitting: 28.6  . Smokeless tobacco: Former Systems developer    Quit date: 08/06/1991  Vaping Use  . Vaping Use: Never used  Substance Use Topics  . Alcohol use: No  .  Drug use: No    Home Medications Prior to Admission medications   Medication Sig Start Date End Date Taking? Authorizing Provider  Ascorbic Acid (VITAMIN C) 100 MG tablet Take 100 mg by mouth in the morning and at bedtime.    Yes [provider]  aspirin EC 81 MG tablet Take 81 mg by mouth daily.   Yes [provider]  atorvastatin (LIPITOR) 20 MG tablet Take 20 mg by mouth daily.  01/27/13  Yes [provider]  carbidopa-levodopa (SINEMET CR) 50-200 MG tablet Take 1 tablet by mouth at bedtime. 06/21/19  Yes Tat, Eustace Quail, DO  carbidopa-levodopa (SINEMET IR) 25-100 MG tablet Take 1 tablet by mouth 5 (five) times daily.  09/23/19   Yes [provider]  Cyanocobalamin (VITAMIN B 12 PO) Take 1 tablet by mouth daily.   Yes [provider]  ferrous sulfate 324 MG TBEC Take 324 mg by mouth in the morning and at bedtime.    Yes [provider]  metFORMIN (GLUCOPHAGE) 850 MG tablet Take 850 mg by mouth daily with breakfast.   Yes [provider]  QUEtiapine (SEROQUEL) 25 MG tablet 1/2 tablet at bed as needed.  May take additional 1/2 in the day prn Patient taking differently: Take 12.5 mg by mouth at bedtime as needed (sleep).  10/06/19  Yes Tat, Eustace Quail, DO  tamsulosin (FLOMAX) 0.4 MG CAPS capsule Take 1 capsule by mouth every evening.  01/27/13  Yes [provider]  Carbidopa-Levodopa ER (SINEMET CR) 25-100 MG tablet controlled release Take 1 tablet by mouth in the morning, at noon, in the evening, and at bedtime. Patient not taking: Reported on 11/11/2019 08/19/19   Tat, Eustace Quail, DO  ONETOUCH ULTRA test strip  02/04/19   [provider]  pramipexole (MIRAPEX) 0.125 MG tablet Take 0.125 mg by mouth at bedtime.  10/15/19  [provider]    Allergies    Patient has no known allergies.  Review of Systems   Review of Systems  All other systems reviewed and are negative.   Physical Exam Updated Vital Signs BP (!) 131/98   Pulse 78   Temp 97.7 F (36.5 C) (Oral)   Resp 16   Ht 6' (1.829 m)   Wt 75.3 kg   SpO2 99%   BMI 22.51 kg/m   Physical Exam Vitals and nursing note reviewed.  Constitutional:      General: He is not in acute distress.    Appearance: He is well-developed. He is not ill-appearing, toxic-appearing or diaphoretic.  HENT:     Head: Normocephalic and atraumatic.     Comments: No visible injury to the head or scalp.    Right Ear: External ear normal.     Left Ear: External ear normal.     Nose: No congestion.     Mouth/Throat:     Pharynx: No oropharyngeal exudate or posterior oropharyngeal erythema.  Eyes:     Conjunctiva/sclera:  Conjunctivae normal.     Pupils: Pupils are equal, round, and reactive to light.  Neck:     Trachea: Phonation normal.  Cardiovascular:     Rate and Rhythm: Normal rate and regular rhythm.     Heart sounds: Normal heart sounds.  Pulmonary:     Effort: Pulmonary effort is normal.     Breath sounds: Normal breath sounds.  Chest:     Chest wall: No tenderness.  Abdominal:     General: There is no distension.  Palpations: Abdomen is soft.     Tenderness: There is no abdominal tenderness.  Musculoskeletal:        General: Normal range of motion.     Cervical back: Normal range of motion and neck supple.     Comments: Normal strength, arms and legs bilaterally. Left arm with multiple superficial abrasions, not requiring suture intervention. No deformity of any of the large joints.  Skin:    General: Skin is warm and dry.  Neurological:     Mental Status: He is alert and oriented to person, place, and time.     Cranial Nerves: No cranial nerve deficit.     Sensory: No sensory deficit.     Motor: No abnormal muscle tone.     Coordination: Coordination normal.     Comments: No dysarthria or aphasia. He responds slowly but accurately, likely related to Parkinson's disease. No ataxia. No pronator drift.  Psychiatric:        Mood and Affect: Mood normal.        Behavior: Behavior normal.     ED Results / Procedures / Treatments   Labs (all labs ordered are listed, but only abnormal results are displayed) Labs Reviewed  BASIC METABOLIC PANEL - Abnormal; Notable for the following components:      Result Value   Glucose, Bld 119 (*)    All other components within normal limits  CBC - Abnormal; Notable for the following components:   RBC 4.17 (*)    All other components within normal limits  URINALYSIS, ROUTINE W REFLEX MICROSCOPIC - Abnormal; Notable for the following components:   Color, Urine STRAW (*)    Ketones, ur 5 (*)    All other components within normal limits  CBG  MONITORING, ED - Abnormal; Notable for the following components:   Glucose-Capillary 106 (*)    All other components within normal limits    EKG EKG Interpretation  Date/Time:  Thursday November 11 2019 09:58:23 EDT Ventricular Rate:  76 PR Interval:    QRS Duration: 91 QT Interval:  395 QTC Calculation: 445 R Axis:   -4 Text Interpretation: Sinus rhythm Atrial premature complexes 12 Lead; [place]ar since last tracing no significant change Confirmed by Daleen Bo [phone]) on 11/11/2019 10:29:50 AM   Radiology CT Head Wo Contrast  Result Date: 11/11/2019 CLINICAL DATA:  Trauma EXAM: CT HEAD WITHOUT CONTRAST TECHNIQUE: Contiguous axial images were obtained from the base of the skull through the vertex without intravenous contrast. COMPARISON:  09/03/2019 FINDINGS: Brain: There is no acute intracranial hemorrhage, mass effect, or edema. Gray-white differentiation is preserved. There is no extra-axial fluid collection. Prominence of the ventricles and sulci reflects stable parenchymal volume loss. Patchy hypoattenuation in the supratentorial white matter is nonspecific but likely reflects stable chronic microvascular ischemic changes. Vascular: There is atherosclerotic calcification at the skull base. Skull: Calvarium is unremarkable. Sinuses/Orbits: No acute finding. Other: None. IMPRESSION: No evidence of acute intracranial injury. Stable chronic findings detailed above. Electronically Signed   By: Macy Mis M.D.   On: 11/11/2019 12:47   CT Cervical Spine Wo Contrast  Result Date: 11/11/2019 CLINICAL DATA:  Fall EXAM: CT CERVICAL SPINE WITHOUT CONTRAST TECHNIQUE: Multidetector CT imaging of the cervical spine was performed without intravenous contrast. Multiplanar CT image reconstructions were also generated. COMPARISON:  09/03/2019 FINDINGS: Alignment: Stable. Skull base and vertebrae: No acute cervical spine fracture. Stable vertebral body heights. Soft tissues and spinal canal: No  prevertebral fluid or swelling. No visible canal hematoma. Disc  levels: Multilevel degenerative changes remains similar in appearance to the recent prior study. Upper chest: No acute abnormality. Other: Calcified plaque at the common carotid bifurcations. IMPRESSION: No acute cervical spine fracture. Electronically Signed   By: Macy Mis M.D.   On: 11/11/2019 12:50    Procedures Procedures (including critical care time)  Medications Ordered in ED Medications - No data to display  ED Course  I have reviewed the triage vital signs and the nursing notes.  Pertinent labs & imaging results that were available during my care of the patient were reviewed by me and considered in my medical decision making (see chart for details).  Clinical Course as of Nov 10 1617  Thu Nov 11, 2019  1146 Wound care, abrasions left arm, by nursing, cleansing and nonabsorbent pad followed by Doreene Nest.   [EW]  1303 Normal except glucose high  Basic metabolic panel(!) [EW]  8366 Normal  CBC(!) [EW]  1303 Mild elevation  CBG monitoring, ED(!) [EW]  1303 Per radiologist, CT imaging of cervical spine and head, negative for acute injuries.   [EW]  1610 Normal  Urinalysis, Routine w reflex microscopic Urine, Clean Catch(!) [EW]    Clinical Course User Index [EW] Daleen Bo, MD   MDM Rules/Calculators/A&P                           Patient Vitals for the past 24 hrs:  BP Temp Temp src Pulse Resp SpO2 Height Weight  11/11/19 1609 -- -- -- 78 16 99 % -- --  11/11/19 1600 (!) 131/98 -- -- 86 16 98 % -- --  11/11/19 1546 -- -- -- 64 18 98 % -- --  11/11/19 1530 113/77 -- -- 64 19 98 % -- --  11/11/19 1515 100/84 -- -- (!) 58 15 99 % -- --  11/11/19 1445 (!) 176/78 -- -- 79 17 99 % -- --  11/11/19 1415 (!) 161/94 -- -- 74 15 100 % -- --  11/11/19 1345 (!) 155/82 -- -- 78 14 98 % -- --  11/11/19 1315 (!) 126/111 -- -- 81 13 97 % -- --  11/11/19 1312 -- -- -- 80 16 99 % -- --  11/11/19 1300 (!) 164/74 --  -- 81 14 99 % -- --  11/11/19 1245 138/83 -- -- 63 (!) 21 98 % -- --  11/11/19 1230 (!) 142/82 -- -- 61 16 99 % -- --  11/11/19 1228 137/68 -- -- 71 16 98 % -- --  11/11/19 1200 (!) 149/70 -- -- 94 16 99 % -- --  11/11/19 1154 -- -- -- 64 13 100 % -- --  11/11/19 1145 (!) 141/71 -- -- 65 14 99 % -- --  11/11/19 1130 140/75 -- -- 65 19 100 % -- --  11/11/19 1122 (!) 150/66 97.7 F (36.5 C) Oral 79 17 100 % 6' (1.829 m) 75.3 kg  11/11/19 1011 123/68 98.3 F (36.8 C) Oral 80 18 98 % -- --  11/11/19 0954 -- -- -- -- -- 99 % -- --    4:19 PM Reevaluation with update and discussion. After initial assessment and treatment, an updated evaluation reveals at this time he remains alert and comfortable.  Findings discussed and questions answered. Daleen Bo   Medical Decision Making:  This patient is presenting for evaluation of fall, possible syncope, recurrent, which does require a range of treatment options, and is a complaint that involves a high  risk of morbidity and mortality. The differential diagnoses include syncope, cardiac arrhythmia, medical illness, metabolic instability. I decided to review old records, and in summary elderly male with Parkinson's disease, frequent falls and 2 recent episodes of possible syncope.  I obtained additional historical information from his daughter at the bedside.   Clinical Laboratory Tests Ordered, included CBC, Metabolic panel and Urinalysis. Review indicates reassuring/normal findings not requiring interventions. Radiologic Tests Ordered, included CT head, CT of the cervical spine.  I independently Visualized: Radiographic images, which show no acute injury     Critical Interventions-clinical evaluation, laboratory testing, radiographic imaging, observation and reassessment  After These Interventions, the Patient was reevaluated and was found stable for discharge.  Reported syncope, versus near syncope, x2 within the last 2 weeks.  No evidence for  acute cardiac syndrome, metabolic illness, significant volume depletion or advancing Parkinson's disease.  No requirement for further ED intervention, or hospitalization at this time.  CRITICAL CARE-no Performed by: Daleen Bo     Nursing Notes Reviewed/ Care Coordinated Applicable Imaging Reviewed Interpretation of Laboratory Data incorporated into ED treatment  The patient appears reasonably screened and/or stabilized for discharge and I doubt any other medical condition or other [facility] requiring further screening, evaluation, or treatment in the ED at this time prior to discharge.  Plan: Home Medications-continue usual; Home Treatments-rest, fluids; return here if the recommended treatment, does not improve the symptoms; Recommended follow up-PCP or neurology checkup 1 to 2 weeks.     Final Clinical Impression(s) / ED Diagnoses Final diagnoses:  Syncope and collapse    Rx / DC Orders ED Discharge Orders    None       Daleen Bo, MD 11/11/19 [phone]

## 2019-11-11 NOTE — Discharge Instructions (Addendum)
We did testing to evaluate you for 2 episodes of syncope.  There does not appear to be any serious injuries, nor any cause for the syncope that can be immediately remediated.  It is important to continue taking your usual medication, drink plenty of fluids and eat 3 good meals a day.  Also making sure you get plenty of rest, and being careful when you walk is important.  Follow-up with your primary care doctor, and/or neurologist, for a checkup in a week or 2.  Return here, if needed, for problems.

## 2019-11-11 NOTE — ED Triage Notes (Addendum)
Pt presents via EMS from home with c/o loss of consciousness. Pt reports that he was standing in the kitchen, saw black spots, and woke up on the floor. CBG was 140 for EMS. Pt reports having a similar episode 2 weeks ago but was not evaluated at that time. Pt has 2 lacs, one to left upper arm and left forearm, bandaged by EMS, bleeding controlled.

## 2019-11-11 NOTE — ED Notes (Signed)
Pt linens and brief changed. Place male purewick on for urine sample collection.

## 2019-11-11 NOTE — ED Notes (Signed)
Pt verbalizes understanding of DC instructions. Pt belongings returned and is assist in [facility] out of ED.

## 2019-11-11 NOTE — ED Notes (Signed)
Wrapped left arm in bandage

## 2019-11-15 ENCOUNTER — Telehealth: Payer: Self-pay | Admitting: Cardiology

## 2019-11-15 ENCOUNTER — Emergency Department (HOSPITAL_COMMUNITY)
Admission: EM | Admit: 2019-11-15 | Discharge: 2019-11-16 | Disposition: A | Discharge status: Home / Self Care | Payer: Medicare Other | Attending: Emergency Medicine | Admitting: Emergency Medicine

## 2019-11-15 ENCOUNTER — Telehealth: Payer: Self-pay | Admitting: Student

## 2019-11-15 DIAGNOSIS — Z5321 Procedure and treatment not carried out due to patient leaving prior to being seen by health care provider: Secondary | ICD-10-CM | POA: Insufficient documentation

## 2019-11-15 DIAGNOSIS — W19XXXA Unspecified fall, initial encounter: Secondary | ICD-10-CM | POA: Diagnosis not present

## 2019-11-15 DIAGNOSIS — Y999 Unspecified external cause status: Secondary | ICD-10-CM | POA: Diagnosis not present

## 2019-11-15 DIAGNOSIS — Y929 Unspecified place or not applicable: Secondary | ICD-10-CM | POA: Insufficient documentation

## 2019-11-15 DIAGNOSIS — Z743 Need for continuous supervision: Secondary | ICD-10-CM | POA: Diagnosis not present

## 2019-11-15 DIAGNOSIS — R55 Syncope and collapse: Secondary | ICD-10-CM | POA: Diagnosis not present

## 2019-11-15 DIAGNOSIS — Y939 Activity, unspecified: Secondary | ICD-10-CM | POA: Insufficient documentation

## 2019-11-15 DIAGNOSIS — R404 Transient alteration of awareness: Secondary | ICD-10-CM | POA: Diagnosis not present

## 2019-11-15 DIAGNOSIS — R0902 Hypoxemia: Secondary | ICD-10-CM | POA: Diagnosis not present

## 2019-11-15 LAB — CBC
HCT: 42.4 % (ref 39.0–52.0)
Hemoglobin: 13.8 g/dL (ref 13.0–17.0)
MCH: 31.8 pg (ref 26.0–34.0)
MCHC: 32.5 g/dL (ref 30.0–36.0)
MCV: 97.7 fL (ref 80.0–100.0)
Platelets: 198 10*3/uL (ref 150–400)
RBC: 4.34 MIL/uL (ref 4.22–5.81)
RDW: 13.2 % (ref 11.5–15.5)
WBC: 5.2 10*3/uL (ref 4.0–10.5)
nRBC: 0 % (ref 0.0–0.2)

## 2019-11-15 LAB — URINALYSIS, ROUTINE W REFLEX MICROSCOPIC
Bacteria, UA: NONE SEEN
Bilirubin Urine: NEGATIVE
Glucose, UA: 50 mg/dL — AB
Hgb urine dipstick: NEGATIVE
Ketones, ur: NEGATIVE mg/dL
Nitrite: NEGATIVE
Protein, ur: NEGATIVE mg/dL
Specific Gravity, Urine: 1.018 (ref 1.005–1.030)
pH: 6 (ref 5.0–8.0)

## 2019-11-15 LAB — BASIC METABOLIC PANEL
Anion gap: 10 (ref 5–15)
BUN: 14 mg/dL (ref 8–23)
CO2: 25 mmol/L (ref 22–32)
Calcium: 9.1 mg/dL (ref 8.9–10.3)
Chloride: 105 mmol/L (ref 98–111)
Creatinine, Ser: 0.9 mg/dL (ref 0.61–1.24)
GFR calc Af Amer: >60 mL/min (ref >60)
GFR calc non Af Amer: >60 mL/min (ref >60)
Glucose, Bld: 162 mg/dL — ABNORMAL HIGH (ref 70–99)
Potassium: 3.4 mmol/L — ABNORMAL LOW (ref 3.5–5.1)
Sodium: 140 mmol/L (ref 135–145)

## 2019-11-15 LAB — CBG MONITORING, ED: Glucose-Capillary: 151 mg/dL — ABNORMAL HIGH (ref 70–99)

## 2019-11-15 NOTE — Telephone Encounter (Signed)
Mr. Meddaugh' daughter [name] called this evening to report that Mr. Oquendo has been taken to the emergency department after an episode of syncope.  This is his third episode in the past few months.  He was seen in the emergency department on 8/12 for the episode preceding this.  She says that all episodes happened while he is walking or standing.  He has lost consciousness and awakened spontaneously within 1 to 2 minutes.  He does not report significant prodrome.  He does not have known valvular heart disease but does not have a recent echo in our system.  He has never had cardiac conduction disease or ventricular arrhythmias.  He did have PVCs on EMS EKG.  I reviewed his electronic medical record showing that here in the emergency department he has heart rate 72, blood pressure 156/71, 100% SPO2, and normal temperature.  Labs are notable only for potassium 3.4 and EKG shows sinus rhythm without ischemic changes.  I reviewed EMS EKG which shows sinus rhythm with a single PVC.   [name] reported that she and her sister, who is with the patient, are considering leaving the emergency department in favor of an outpatient appointment later this week.  I recommended to [facility] that a very likely etiology for his recurrent syncopal episodes is orthostatic hypotension, which is common in patients with Parkinson's disease, given the positional nature of his episodes.  However I also recommended that the most concerning etiology would be sustained heart block or ventricular arrhythmia, the latter of which he is at risk for as he has coronary artery disease. The benefits of remaining in the emergency department would be sustained telemetry monitoring and other testing as indicated by physical exam. If he does go home, he is at theoretical risk of recurrent sustained ventricular arrhythmia which could be life-threatening.  It is very difficult based on current information to estimate what that risk level would be.  I let her know  that I would pass her concerns on to her primary cardiologist and that, if he does not have a complete evaluation in the ED, I recommend urgent outpatient appointment with orthostatic vital sign testing and consideration of Holter monitoring.   Osvaldo Shipper, MD

## 2019-11-15 NOTE — ED Triage Notes (Signed)
Pt from home with ems for syncope. Pt seen here a few days ago for the same and was discharged. Pt has no injuries from fall. Pt a.o, slow to answer. VSS

## 2019-11-15 NOTE — Telephone Encounter (Signed)
   Daughter called Answering Service to report that patient is being taken by EMS to [facility] for recurrent syncope and falls. Called and spoke with daughter earlier this evening. She states patient was here last week for similar symptoms and it sounds like it was felt to be due to orthostatics. Patient was discharged home from ED. Daughter is very concerned given recurrent episodes. She also mentioned that EMS saw rhythm concerning for VT. Reassured daughter that the ED providers are usually consult [place] on recurrent presentations for this. Explained that they can request Cardiology consult if they would like but would let ED providers evaluated him first. Daughter thanked me for calling.  Darreld Mclean, PA-C 11/15/2019 9:48 PM

## 2019-11-16 NOTE — ED Notes (Signed)
Pt not responding to roll call or vitals recheck

## 2019-11-17 DIAGNOSIS — L602 Onychogryphosis: Secondary | ICD-10-CM | POA: Diagnosis not present

## 2019-11-17 DIAGNOSIS — I872 Venous insufficiency (chronic) (peripheral): Secondary | ICD-10-CM | POA: Diagnosis not present

## 2019-11-17 DIAGNOSIS — E1151 Type 2 diabetes mellitus with diabetic peripheral angiopathy without gangrene: Secondary | ICD-10-CM | POA: Diagnosis not present

## 2019-11-17 DIAGNOSIS — R2689 Other abnormalities of gait and mobility: Secondary | ICD-10-CM | POA: Diagnosis not present

## 2019-11-17 DIAGNOSIS — L84 Corns and callosities: Secondary | ICD-10-CM | POA: Diagnosis not present

## 2019-11-19 DIAGNOSIS — M79605 Pain in left leg: Secondary | ICD-10-CM | POA: Diagnosis not present

## 2019-11-19 DIAGNOSIS — R296 Repeated falls: Secondary | ICD-10-CM | POA: Diagnosis not present

## 2019-11-24 ENCOUNTER — Telehealth: Payer: Self-pay | Admitting: *Deleted

## 2019-11-24 ENCOUNTER — Encounter: Payer: Self-pay | Admitting: *Deleted

## 2019-11-24 NOTE — Telephone Encounter (Signed)
Left a message for the patient/daughter to call back to discuss the Loop Recorder implant on 11/29/19.

## 2019-11-24 NOTE — Telephone Encounter (Signed)
Spoke to the wife about the procedure. She has been advised to call back, or her daughter, with any questions. Message also sent on [place] at [address], [place]  [place], [place] [address]  Phone: [phone] Fax: [phone]   You are scheduled for a Loop Recorder Implant on 11/29/19  at 12:20 with Dr. Sallyanne Kuster. This will take place at [facility], [address].    Please arrive at your appointment 15-20 minutes early.    You do not need to be fasting.    The procedure is performed with local anesthesia. You will not receive sedatives nor will an IV be placed.    Wash your chest and neck with the surgical soap the evening before and the morning of your procedure. Please following the washing instructions provided.    As with all surgical implants, there is a small risk of infection. If an infection occurs, the device will be removed. To help reduce the risk, please use the surgical scrub provided. Additional antiseptic precautions will be taken at the time of the procedure.    Please bring your insurance cards.   *Please note that scheduled loop recorder implants may need to be rescheduled if Dr. Sallyanne Kuster has a procedure urgently added on at the hospital   Preparing for the Procedure  Before the procedure, you can play an important role. Because skin is not sterile, your skin needs to be as free of germs as possible. You can reduce the number of germs on your skin by washing with CHG (chlorhexidine gluconate) Soap before the procedure. CHG is an antiseptic cleaner which kills germs and bonds with the skin to continue killing germs even after washing.  Please do not use if you have an allergy to CHG or antibacterial soaps. If your skin becomes reddened/irritated, STOP using the CHG.  DO NOT SHAVE (including legs and underarms) for at least 48 hours prior to first CHG shower. It is OK to shave your face.  Please follow  these instructions carefully: 1. Shower the night before the procedure and the morning of with CHG Soap. 2. If you chose to wash your hair, wash your hair first as usual with your normal shampoo/conditioner. 3. After you shampoo/condition, rinse you hair and body thoroughly to remove shampoo/conditioner. 4. Use CHG as you would any other liquid soap. You can apply CHG directly to the skin and wash gently with a loofah or a clean washcloth. 5. Apply the CHG Soap to your body ONLY FROM THE NECK DOWN. Do not use on open wounds or open sores. Avoid contact with your eyes, ears, mouth, and genitals (private parts).  6. Wash thoroughly, paying special attention to the area where your surgery will be performed. 7. Thoroughly rinse your body with warm water from the neck down. 8. DO NOT shower/wash with your normal soap after using and rinsing off the CHG Soap. 9. Pat yourself dry with a clean towel. 10. Wear clean pajamas to bed. 11. Place clean sheets on your bed the night of your first shower and do not sleep with pets..  Day of Surgery: Shower with the CHG Soap following the instructions listed above. DO NOT apply deodorants or lotions.

## 2019-11-24 NOTE — Telephone Encounter (Signed)
Follow up ° ° °Pt's wife returning call °

## 2019-11-29 ENCOUNTER — Ambulatory Visit (INDEPENDENT_AMBULATORY_CARE_PROVIDER_SITE_OTHER): Payer: Medicare Other | Admitting: Cardiovascular Disease

## 2019-11-29 ENCOUNTER — Ambulatory Visit: Payer: Medicare Other | Admitting: General Practice

## 2019-11-29 ENCOUNTER — Other Ambulatory Visit: Payer: Self-pay

## 2019-11-29 ENCOUNTER — Telehealth: Payer: Self-pay | Admitting: Internal Medicine

## 2019-11-29 DIAGNOSIS — R55 Syncope and collapse: Secondary | ICD-10-CM

## 2019-11-29 DIAGNOSIS — Z95818 Presence of other cardiac implants and grafts: Secondary | ICD-10-CM | POA: Diagnosis not present

## 2019-11-29 MED ORDER — LIDOCAINE-EPINEPHRINE 1 %-1:100000 IJ SOLN: 10.0000 mL | Freq: Once | INTRAMUSCULAR | Status: DC

## 2019-11-29 NOTE — CV Procedure (Addendum)
LOOP RECORDER IMPLANT   Procedure report  Procedure performed:  Loop recorder implantation   Reason for procedure:  Recurrent syncope/near-syncope Procedure performed by:  Sanda Klein, MD  Complications:  None  Estimated blood loss:  <5 mL  Medications administered during procedure:  Lidocaine 2% with 1/100,000 epinephrine 10 mL locally Device details:  Medtronic Reveal Linq model number [id number], serial number [id number] Procedure details:  After the risks and benefits of the procedure were discussed the patient provided informed consent. The patient was prepped and draped in usual sterile fashion. Local anesthesia was administered to an area 2 cm to the left of the sternum in the 4th intercostal space. A cutaneous incision was made using the incision tool. The introducer was then used to create a subcutaneous tunnel and carefully deploy the device. Local pressure was held to ensure hemostasis.  The incision was closed with SteriStrips and a sterile dressing was applied.  R waves 0.38 mV.  Sanda Klein, MD, [facility] [phone] office [phone] pager 11/29/2019 1:33 PM

## 2019-11-29 NOTE — Patient Instructions (Addendum)
Discharge Instructions for  Loop Recorder Implant    Follow up: Keep your wound check appointment on 12/09/19 at 11:30 am.  If you have any questions or concerns, please call the office at [phone].  ACTIVITY No restrictions. DO wear your seatbelt, even if it crosses over the site.   WOUND CARE  Keep the wound area clean and dry.  Remove the dressing the day after (usually 24 hours after the procedure).  DO NOT SUBMERGE UNDER WATER UNTIL FULLY HEALED (no tub baths, hot tubs, swimming pools, etc.).   You  may shower or take a sponge bath after the dressing is removed. DO NOT SOAK the area and do not allow the shower to directly spray on the site.  If you have tape/steri-strips on your wound, these will fall off; do not pull them off prematurely.    No bandage is needed on the site.  DO  NOT apply any creams, oils, or ointments to the wound area.  If you notice any drainage or discharge from the wound, any swelling, excessive redness or bruising at the site, or if you develop a fever > 101? F, call the office at once at [phone].

## 2019-11-29 NOTE — Telephone Encounter (Signed)
Pt's daughter called   Had LINQ placed today  They were concerned that the site was a little red No  Large hematoma.  No bleeding  Dressing intact  Recomm: Infection at this point unlikely  Too early Monitor in am   Call office if changes / worsening

## 2019-11-30 ENCOUNTER — Ambulatory Visit: Payer: Medicare Other | Admitting: Neurology

## 2019-11-30 DIAGNOSIS — M545 Low back pain: Secondary | ICD-10-CM | POA: Diagnosis not present

## 2019-11-30 DIAGNOSIS — M79605 Pain in left leg: Secondary | ICD-10-CM | POA: Diagnosis not present

## 2019-11-30 NOTE — Telephone Encounter (Signed)
Thanks. I got in touch with them

## 2019-12-09 ENCOUNTER — Other Ambulatory Visit: Payer: Self-pay

## 2019-12-09 ENCOUNTER — Ambulatory Visit: Payer: Medicare Other | Admitting: Emergency Medicine

## 2019-12-09 ENCOUNTER — Ambulatory Visit: Payer: Medicare Other | Admitting: Physician Assistant

## 2019-12-09 DIAGNOSIS — R55 Syncope and collapse: Secondary | ICD-10-CM

## 2019-12-09 DIAGNOSIS — Z95818 Presence of other cardiac implants and grafts: Secondary | ICD-10-CM

## 2019-12-09 LAB — CUP PACEART INCLINIC DEVICE CHECK
Date Time Interrogation Session: 20210909134614
Implantable Pulse Generator Implant Date: 20210830

## 2019-12-09 NOTE — Progress Notes (Signed)
ILR wound check in clinic. Steri strips removed. Wound well healed. Home monitor transmitting nightly. No episodes. Patient and family educated about wound care, Carelink monitor, and symptom activator. Monthly summary reports and ROV with Dr. Sallyanne Kuster in 09/2020.

## 2019-12-20 ENCOUNTER — Telehealth: Payer: Medicare Other | Admitting: Family

## 2019-12-20 DIAGNOSIS — J019 Acute sinusitis, unspecified: Secondary | ICD-10-CM | POA: Diagnosis not present

## 2019-12-20 MED ORDER — AMOXICILLIN-POT CLAVULANATE 875-125 MG PO TABS
1.0000 | ORAL_TABLET | Freq: Two times a day (BID) | ORAL | 0 refills | Status: DC
Start: 2019-12-20 — End: 2020-03-17

## 2019-12-20 NOTE — Progress Notes (Signed)

## 2019-12-22 DIAGNOSIS — I2510 Atherosclerotic heart disease of native coronary artery without angina pectoris: Secondary | ICD-10-CM | POA: Diagnosis not present

## 2019-12-22 DIAGNOSIS — R262 Difficulty in walking, not elsewhere classified: Secondary | ICD-10-CM | POA: Diagnosis not present

## 2019-12-22 DIAGNOSIS — I779 Disorder of arteries and arterioles, unspecified: Secondary | ICD-10-CM | POA: Diagnosis not present

## 2019-12-22 DIAGNOSIS — Z87891 Personal history of nicotine dependence: Secondary | ICD-10-CM | POA: Diagnosis not present

## 2019-12-22 DIAGNOSIS — M545 Low back pain: Secondary | ICD-10-CM | POA: Diagnosis not present

## 2019-12-30 ENCOUNTER — Telehealth: Payer: Self-pay | Admitting: Hospice

## 2019-12-30 NOTE — Telephone Encounter (Signed)
Spoke with patient's wife, [name], regarding the Palliative referral and all questions were answered and she was in agreement with scheduling visit.  I have scheduled an In-person Consult for 12/31/19 @ 8:30 AM.

## 2019-12-31 ENCOUNTER — Other Ambulatory Visit: Payer: Medicare Other | Admitting: Hospice

## 2019-12-31 ENCOUNTER — Other Ambulatory Visit: Payer: Self-pay

## 2019-12-31 DIAGNOSIS — Z515 Encounter for palliative care: Secondary | ICD-10-CM

## 2019-12-31 DIAGNOSIS — G20 Parkinson's disease: Secondary | ICD-10-CM

## 2019-12-31 NOTE — Progress Notes (Addendum)
[facility] Consult Note Telephone: [phone]  Fax: [phone]  PATIENT NAME: Rodney Love [address] [place] [address] [id number] (home)  DOB: 05-12-1945 MRN: [id number]  PRIMARY CARE PROVIDER:    [name], [place],  [address] [place] [address] [phone]  REFERRING PROVIDER:  Vassie Moment NP/ Dr [name]  RESPONSIBLE PARTY:  Self: [phone] h best number to call [place] [phone]  Extended Emergency Contact Information Primary Emergency Contact: [place], [place] Phone: [phone] Mobile Phone: [phone] Relation: Daughter Secondary Emergency Contact: Ellender Hose Address: [address]          [place], [place] Phone: [phone] Mobile Phone: [phone] Relation: Spouse  [name] - daughter [phone]  I met face to face with patient and family in home/facility.  RECOMMENDATIONS/PLAN:    Visit at the request of Vassie Moment NP  for palliative consult. Visit consisted of building trust and discussions on Palliative Medicine as specialized medical care for people living with serious illness, aimed at facilitating better quality of life through symptoms relief, assisting with advance care plan and establishing goals of care.   Discussion on the difference between Palliative and Hospice care. Palliative care and hospice have similar goals of managing symptoms, promoting comfort, improving quality of life, and maintaining a person's dignity. However, palliative care may be offered during any phase of a serious illness, while hospice care is usually offered when a person is expected to live for 6 months or less.  Advance Care Planning: Our advance care planning conversation included a discussion about:    The value and importance of advance care planning  Exploration of personal, cultural or spiritual  beliefs that might influence medical decisions  Exploration of goals of care in the event of a sudden injury or illness  Identification and preparation of a healthcare agent  Review and updating or creation of an advance directive document  CODE STATUS: Extensive discussions on the implications and ramifications of CODE STATUS.  Patient elected to be a FULL code.  Family verbalized understanding and willingness to reconsider the CODE STATUS in the future when it is necessary.  GOALS OF CARE: Family wishes for patient to regain his strength and ambulate around the house as he previously did.  Goals of care include to maximize quality of life and symptom management. Visit also consisted of discussion dealing with the complex and emotionally intense issues of symptom management and palliative care in the setting of serious and potentially life-threatening illness.  Christian faith and application to [employer] as sources of strength for family.  Palliative care team will continue to support patient, family, and medical team.  Follow up Palliative Care Visit: Palliative care will continue to follow for goals of care clarification and symptom management.  Follow-up in 3 weeks to further discuss on goals of care clarification.  Functional decline/Symptom Management: Patient with generalized body weakness, gait instability/recent fall. Patient able to stand with one person assist and walks few steps with his rolling walker, contact guard assist. Patient with jerkiness and stiffness. [name] reported patient has an order for Home health services.  NP reached out to PCPs office and left message with [facility] recommending PT OT for patient and skilled nursing visits for stage II sacral wound.  Family to follow-up with PCP's office if no feedback by next week Thursday.  Lonnie NP later called back to  endorse the recommendations and said he will order those accordingly. Patient sits in his recliner most day and also  sleeps there at night.  Education provided on the pressure on the sacral area with such prolonged sitting and laying.  Repositioning is very necessary to relieve pressure, prevent further skin breakdown and promote healing.  Patient/family was agreeable; patient to be sleeping in his bed in his bedroom every day and also to return to bed in the afternoon for an hour of rest.  Family continues on nonadherent dressing to sacral area.   Patient with recurrent  Syncope/collapse episodes.  He was last seen in the ED 11/11/2019 for syncope.  Pertinent labs and diagnostics were reassuring and patient was discharged home.  Patient currently has a loop recorder to rule out arrhythymia related to recurring blackout/syncope.   Patient is incontinent of bowel and bladder, hallucinates sometimes, forgetful and sometimes does not remember his spouse and children.  Education provided on Parkinson disease as progressive and terminal, what the family should expect.  They verbalized understanding that symptoms may worsen and patient in the future may further decline cognitively, not be able to walk or swallow safely.  Therapeutic listening and ample emotional support provided as family expressed their concern and distress over patient's decline.   Patient is followed by neurologist. Appointment with Neurologist this October ending. Sinemet recently reduced 25/129m five times a day to four times a day and 50/2067mat bedtime.  Spouse manages his medication and patient is compliant with his medications as he is given. Palliative will continue to monitor for symptom management/decline and make recommendations as needed.   Family /Caregiver/Community Supports: Patient lives as home with spouse; has two daughters who live close by and are involved in his care. Strong family support network identified. Patient used to work as crRunner, broadcasting/film/video guDevelopment worker, international aidChDarrick Meigsaith and application to  [employer] sources of strength for family.   I spent one hour and 50 minutes providing this initial consultation; time includes time spent with patient/family, chart review, provider coordination,  and documentation. More than 50% of the time in this consultation was spent on coordinating communication  CHIEF COMPLAIN/HISTORY OF PRESENT ILLNESS:  JoHATCHER FRONINGs a [age].o. male with multiple medical problems including Parkinson's disease, HTN, CAD, Syncope. Palliative Care was asked to follow this patient by consultation request of PhDeland PrettyMD to help address advance care planning and goals of care.  CODE STATUS: Full code  PPS: 40%  HOSPICE ELIGIBILITY/DIAGNOSIS: TBD  PAST MEDICAL HISTORY:  Past Medical History:  Diagnosis Date  . Arthritis   . Constipation    OCCASIONAL  . Coronary artery disease   . Diabetes mellitus without complication ([facility]  . Environmental allergies   . Hyperlipidemia   . Hypertension   . Inguinal hernia   . Iron deficiency   . Nocturia   . Parkinson disease ([facility]  . Peripheral arterial disease ([facility]    SOCIAL HX:  Social History   Tobacco Use  . Smoking status: Former Smoker    Quit date: 04/02/1991    Years since quitting: 28.7  . Smokeless tobacco: Former Us[employer] developer  Quit date: 08/06/1991  Substance Use Topics  . Alcohol use: No   FAMILY HX:  Family History  Problem Relation Age of Onset  . Diabetes Mother   . Kidney disease Mother   . Stroke Father   . Stroke Brother   . Cancer  Sister   . Cancer Sister   . Diabetes Sister   . Healthy Daughter   . Healthy Daughter   . Healthy Son     ALLERGIES: No Known Allergies   PERTINENT MEDICATIONS:  Outpatient Encounter Medications as of 12/31/2019  Medication Sig  . amoxicillin-clavulanate (AUGMENTIN) 875-125 MG tablet Take 1 tablet by mouth 2 (two) times daily.  . Ascorbic Acid (VITAMIN C) 100 MG tablet Take 100 mg by mouth in the morning and at bedtime.   Marland Kitchen aspirin EC 81 MG tablet  Take 81 mg by mouth daily.  Marland Kitchen atorvastatin (LIPITOR) 20 MG tablet Take 20 mg by mouth daily.   . carbidopa-levodopa (SINEMET CR) 50-200 MG tablet Take 1 tablet by mouth at bedtime.  . Carbidopa-Levodopa ER (SINEMET CR) 25-100 MG tablet controlled release Take 1 tablet by mouth in the morning, at noon, in the evening, and at bedtime.  . Cyanocobalamin (VITAMIN B 12 PO) Take 1 tablet by mouth daily.  . ferrous sulfate 324 MG TBEC Take 324 mg by mouth in the morning and at bedtime.   . metFORMIN (GLUCOPHAGE) 850 MG tablet Take 850 mg by mouth daily with breakfast.  . ONETOUCH ULTRA test strip   . QUEtiapine (SEROQUEL) 25 MG tablet 1/2 tablet at bed as needed.  May take additional 1/2 in the day prn (Patient taking differently: Take 12.5 mg by mouth at bedtime as needed (sleep). )  . tamsulosin (FLOMAX) 0.4 MG CAPS capsule Take 1 capsule by mouth every evening.   . [DISCONTINUED] pramipexole (MIRAPEX) 0.125 MG tablet Take 0.125 mg by mouth at bedtime.   Facility-Administered Encounter Medications as of 12/31/2019  Medication  . lidocaine-EPINEPHrine (XYLOCAINE W/EPI) 1 %-1:100000 (with pres) injection 10 mL    PHYSICAL EXAM / ROS: Height 6 feet 1 inch Weight 159Ibs  General: In no acute distress Cardiovascular: regular rate and rhythm, no chest pain reported; patient has a loop recorder Pulmonary: no cough, no shortness of breath, clear ant/post fields, normal respiratory effort Abdomen: soft, non tender, positive bowel sounds in all quadrants; no report of constipation GU: denies dysuria, no suprapubic tenderness Extremities: no edema, no joint deformities Skin: Stage II pressure wound in the sacrum; dressing clean dry and intact; encouraged change in position  to relieve pressure; no rashes to exposed skin Neurological: Weakness but otherwise non focal, forgetful, confused, sometimes does not recognize spouse and children  Teodoro Spray, NP

## 2020-01-03 ENCOUNTER — Ambulatory Visit (INDEPENDENT_AMBULATORY_CARE_PROVIDER_SITE_OTHER): Payer: Medicare Other

## 2020-01-03 DIAGNOSIS — R55 Syncope and collapse: Secondary | ICD-10-CM

## 2020-01-03 LAB — CUP PACEART REMOTE DEVICE CHECK
Date Time Interrogation Session: 20211003215748
Implantable Pulse Generator Implant Date: 20210830

## 2020-01-04 DIAGNOSIS — M5136 Other intervertebral disc degeneration, lumbar region: Secondary | ICD-10-CM | POA: Diagnosis not present

## 2020-01-04 DIAGNOSIS — R296 Repeated falls: Secondary | ICD-10-CM | POA: Diagnosis not present

## 2020-01-04 DIAGNOSIS — M545 Low back pain, unspecified: Secondary | ICD-10-CM | POA: Diagnosis not present

## 2020-01-04 DIAGNOSIS — G8929 Other chronic pain: Secondary | ICD-10-CM | POA: Diagnosis not present

## 2020-01-11 DIAGNOSIS — E611 Iron deficiency: Secondary | ICD-10-CM | POA: Diagnosis not present

## 2020-01-11 DIAGNOSIS — I6521 Occlusion and stenosis of right carotid artery: Secondary | ICD-10-CM | POA: Diagnosis not present

## 2020-01-11 DIAGNOSIS — R296 Repeated falls: Secondary | ICD-10-CM | POA: Diagnosis not present

## 2020-01-11 DIAGNOSIS — Z87891 Personal history of nicotine dependence: Secondary | ICD-10-CM | POA: Diagnosis not present

## 2020-01-11 DIAGNOSIS — E785 Hyperlipidemia, unspecified: Secondary | ICD-10-CM | POA: Diagnosis not present

## 2020-01-11 DIAGNOSIS — I771 Stricture of artery: Secondary | ICD-10-CM | POA: Diagnosis not present

## 2020-01-11 DIAGNOSIS — Z7984 Long term (current) use of oral hypoglycemic drugs: Secondary | ICD-10-CM | POA: Diagnosis not present

## 2020-01-11 DIAGNOSIS — G2581 Restless legs syndrome: Secondary | ICD-10-CM | POA: Diagnosis not present

## 2020-01-11 DIAGNOSIS — E559 Vitamin D deficiency, unspecified: Secondary | ICD-10-CM | POA: Diagnosis not present

## 2020-01-11 DIAGNOSIS — L89322 Pressure ulcer of left buttock, stage 2: Secondary | ICD-10-CM | POA: Diagnosis not present

## 2020-01-11 DIAGNOSIS — E1151 Type 2 diabetes mellitus with diabetic peripheral angiopathy without gangrene: Secondary | ICD-10-CM | POA: Diagnosis not present

## 2020-01-11 DIAGNOSIS — G20 Parkinson's disease: Secondary | ICD-10-CM | POA: Diagnosis not present

## 2020-01-12 DIAGNOSIS — I6521 Occlusion and stenosis of right carotid artery: Secondary | ICD-10-CM | POA: Diagnosis not present

## 2020-01-12 DIAGNOSIS — Z7984 Long term (current) use of oral hypoglycemic drugs: Secondary | ICD-10-CM | POA: Diagnosis not present

## 2020-01-12 DIAGNOSIS — G2581 Restless legs syndrome: Secondary | ICD-10-CM | POA: Diagnosis not present

## 2020-01-12 DIAGNOSIS — E559 Vitamin D deficiency, unspecified: Secondary | ICD-10-CM | POA: Diagnosis not present

## 2020-01-12 DIAGNOSIS — R296 Repeated falls: Secondary | ICD-10-CM | POA: Diagnosis not present

## 2020-01-12 DIAGNOSIS — E1151 Type 2 diabetes mellitus with diabetic peripheral angiopathy without gangrene: Secondary | ICD-10-CM | POA: Diagnosis not present

## 2020-01-12 DIAGNOSIS — Z87891 Personal history of nicotine dependence: Secondary | ICD-10-CM | POA: Diagnosis not present

## 2020-01-12 DIAGNOSIS — L89322 Pressure ulcer of left buttock, stage 2: Secondary | ICD-10-CM | POA: Diagnosis not present

## 2020-01-12 DIAGNOSIS — G20 Parkinson's disease: Secondary | ICD-10-CM | POA: Diagnosis not present

## 2020-01-12 DIAGNOSIS — E611 Iron deficiency: Secondary | ICD-10-CM | POA: Diagnosis not present

## 2020-01-12 DIAGNOSIS — E785 Hyperlipidemia, unspecified: Secondary | ICD-10-CM | POA: Diagnosis not present

## 2020-01-12 DIAGNOSIS — I771 Stricture of artery: Secondary | ICD-10-CM | POA: Diagnosis not present

## 2020-01-13 DIAGNOSIS — Z7984 Long term (current) use of oral hypoglycemic drugs: Secondary | ICD-10-CM | POA: Diagnosis not present

## 2020-01-13 DIAGNOSIS — E785 Hyperlipidemia, unspecified: Secondary | ICD-10-CM | POA: Diagnosis not present

## 2020-01-13 DIAGNOSIS — R296 Repeated falls: Secondary | ICD-10-CM | POA: Diagnosis not present

## 2020-01-13 DIAGNOSIS — I771 Stricture of artery: Secondary | ICD-10-CM | POA: Diagnosis not present

## 2020-01-13 DIAGNOSIS — I6521 Occlusion and stenosis of right carotid artery: Secondary | ICD-10-CM | POA: Diagnosis not present

## 2020-01-13 DIAGNOSIS — E611 Iron deficiency: Secondary | ICD-10-CM | POA: Diagnosis not present

## 2020-01-13 DIAGNOSIS — Z87891 Personal history of nicotine dependence: Secondary | ICD-10-CM | POA: Diagnosis not present

## 2020-01-13 DIAGNOSIS — L89322 Pressure ulcer of left buttock, stage 2: Secondary | ICD-10-CM | POA: Diagnosis not present

## 2020-01-13 DIAGNOSIS — G2581 Restless legs syndrome: Secondary | ICD-10-CM | POA: Diagnosis not present

## 2020-01-13 DIAGNOSIS — G20 Parkinson's disease: Secondary | ICD-10-CM | POA: Diagnosis not present

## 2020-01-13 DIAGNOSIS — E1151 Type 2 diabetes mellitus with diabetic peripheral angiopathy without gangrene: Secondary | ICD-10-CM | POA: Diagnosis not present

## 2020-01-13 DIAGNOSIS — E559 Vitamin D deficiency, unspecified: Secondary | ICD-10-CM | POA: Diagnosis not present

## 2020-01-17 ENCOUNTER — Other Ambulatory Visit: Payer: Self-pay

## 2020-01-17 ENCOUNTER — Other Ambulatory Visit: Payer: Medicare Other | Admitting: Hospice

## 2020-01-17 DIAGNOSIS — G20 Parkinson's disease: Secondary | ICD-10-CM

## 2020-01-17 DIAGNOSIS — Z515 Encounter for palliative care: Secondary | ICD-10-CM

## 2020-01-17 NOTE — Progress Notes (Signed)
[name]: [phone]  Fax: [phone]  PATIENT NAME: Rodney Love DOB: [birth year] MRN: [id number]  PRIMARY CARE PROVIDER:   [name], MD Rodney Love, [place],  [place] [address]  REFERRING PROVIDER:  Vassie Moment NP/ Dr Rodney Love  RESPONSIBLE PARTY:  Self: [phone] h best number to call [place] [phone]  Extended Emergency Contact Information Primary Emergency Contact: [place], [place] Phone: [phone] Mobile Phone: [phone] Relation: Daughter Secondary Emergency Contact: Ellender Hose Address: [address]          [place], [place] Phone: [phone] Mobile Phone: [phone] Relation: Spouse  [name] - daughter [phone]  I met face to face with patient and family in home/facility.  TELEHEALTH VISIT STATEMENT Due to the COVID-19 crisis, this visit was done via zoom call from my office. It was initiated and consented to by this patient and/or family.   RECOMMENDATIONS/PLAN:     Visit consisted of building trust and discussions on Palliative Medicine as specialized medical care for people living with serious illness, aimed at facilitating better quality of life through symptoms relief, assisting with advance care plan and establishing goals of care.  Education provided on Parkinson dementia disease trajectory.  CODE STATUS:  Patient is a full code.  Family open to future discussions on CODE STATUS.  GOALS OF CARE: Family wishes for patient to regain his strength and ambulate around the house as he previously did.  Goals of care include to maximize quality of life and symptom management.  Follow up Palliative Care Visit: Palliative care will continue to follow for goals of care clarification and symptom management.  Follow-up in 2 months.  Functional decline/Symptom  Management:  Generalized body weakness, gait instability/recent fall: Fall last week with no injury.  Patient recently evaluated for PT OT; OT expected to soon commence weekly and PT 2 times a week x4 weeks.  Education provided on use of rolling walker for supports; fall precautions. Encouraged low energy utilization activities and a balance of rest and activities to conserve energy.  Patient with jerkiness and stiffness. [name] reported patient has an order for Home health services.    Stage II sacral wound related to prolonged sitting.  Repositioning encouraged to relieve pressure and prevent further skin breakdown/promote healing.  Home health nurse to follow wound.  Family reported nurse will draw labs next week - A1c, CBC.    From previous visit 12/31/2019: He was last seen in the ED 11/11/2019 for syncope.  Pertinent labs and diagnostics were reassuring and patient was discharged home.  Patient currently has a loop recorder to rule out arrhythymia related to recurring blackout/syncope.   Patient is incontinent of bowel and bladder, hallucinates sometimes, forgetful and sometimes does not remember his spouse and children.  Education provided on Parkinson disease as progressive and terminal, what the family should expect.  They verbalized understanding that symptoms may worsen and patient in the future may further decline cognitively, not be able to walk or swallow safely. Appetite is good. Current weight 160Ibs Height 6 feet 2 inches.    Patient is followed by neurologist. Appointment with Neurologist this October ending. Sinemet recently reduced 25/159m five times a day to four times a day and 50/2039mat bedtime.  Spouse manages his medication and patient is compliant with his medications as he is given.  Family /Caregiver/Community Supports: Patient  lives as home with spouse; has two daughters who live close by and are involved in his care. Strong family support network identified. Patient used  to work as Runner, broadcasting/film/video - Development worker, international aid. Darrick Meigs faith and application to [employer] as sources of strength for family.   I spent one 46 minutes providing this consultation; time includes time spent with patient/family, chart review, provider coordination,  and documentation. More than 50% of the time in this consultation was spent on coordinating communication  CHIEF COMPLAIN/HISTORY OF PRESENT ILLNESS:  Rodney Love is a 74 y.o. male with multiple medical problems including Parkinson's disease, HTN, CAD, Syncope. Palliative Care was asked to follow this patient by consultation request of Rodney Pretty, MD/Lonnie Delilah Shan NP to help address advance care planning and goals of care.  CODE STATUS: Full code  PPS: 40%  HOSPICE ELIGIBILITY/DIAGNOSIS: TBD  PAST MEDICAL HISTORY:  Past Medical History:  Diagnosis Date  . Arthritis   . Constipation    OCCASIONAL  . Coronary artery disease   . Diabetes mellitus without complication ([place])   . Environmental allergies   . Hyperlipidemia   . Hypertension   . Inguinal hernia   . Iron deficiency   . Nocturia   . Parkinson disease ([facility])   . Peripheral arterial disease ([facility])     SOCIAL HX:  Social History   Tobacco Use  . Smoking status: Former Smoker    Quit date: 04/02/1991    Years since quitting: 28.8  . Smokeless tobacco: Former Systems developer    Quit date: 08/06/1991  Substance Use Topics  . Alcohol use: No    ALLERGIES: No Known Allergies   PERTINENT MEDICATIONS:  Outpatient Encounter Medications as of 01/17/2020  Medication Sig  . amoxicillin-clavulanate (AUGMENTIN) 875-125 MG tablet Take 1 tablet by mouth 2 (two) times daily.  . Ascorbic Acid (VITAMIN C) 100 MG tablet Take 100 mg by mouth in the morning and at bedtime.   Marland Kitchen aspirin EC 81 MG tablet Take 81 mg by mouth daily.  Marland Kitchen atorvastatin (LIPITOR) 20 MG tablet Take 20 mg by mouth daily.   . carbidopa-levodopa (SINEMET CR) 50-200 MG tablet Take  1 tablet by mouth at bedtime.  . Carbidopa-Levodopa ER (SINEMET CR) 25-100 MG tablet controlled release Take 1 tablet by mouth in the morning, at noon, in the evening, and at bedtime.  . Cyanocobalamin (VITAMIN B 12 PO) Take 1 tablet by mouth daily.  . ferrous sulfate 324 MG TBEC Take 324 mg by mouth in the morning and at bedtime.   . metFORMIN (GLUCOPHAGE) 850 MG tablet Take 850 mg by mouth daily with breakfast.  . ONETOUCH ULTRA test strip   . QUEtiapine (SEROQUEL) 25 MG tablet 1/2 tablet at bed as needed.  May take additional 1/2 in the day prn (Patient taking differently: Take 12.5 mg by mouth at bedtime as needed (sleep). )  . tamsulosin (FLOMAX) 0.4 MG CAPS capsule Take 1 capsule by mouth every evening.   . [DISCONTINUED] pramipexole (MIRAPEX) 0.125 MG tablet Take 0.125 mg by mouth at bedtime.   Facility-Administered Encounter Medications as of 01/17/2020  Medication  . lidocaine-EPINEPHrine (XYLOCAINE W/EPI) 1 %-1:100000 (with pres) injection 10 mL    Teodoro Spray, NP

## 2020-01-17 NOTE — Progress Notes (Signed)
Carelink Summary Report / Loop Recorder 

## 2020-01-18 DIAGNOSIS — G20 Parkinson's disease: Secondary | ICD-10-CM | POA: Diagnosis not present

## 2020-01-18 DIAGNOSIS — E611 Iron deficiency: Secondary | ICD-10-CM | POA: Diagnosis not present

## 2020-01-18 DIAGNOSIS — Z7984 Long term (current) use of oral hypoglycemic drugs: Secondary | ICD-10-CM | POA: Diagnosis not present

## 2020-01-18 DIAGNOSIS — E1151 Type 2 diabetes mellitus with diabetic peripheral angiopathy without gangrene: Secondary | ICD-10-CM | POA: Diagnosis not present

## 2020-01-18 DIAGNOSIS — Z87891 Personal history of nicotine dependence: Secondary | ICD-10-CM | POA: Diagnosis not present

## 2020-01-18 DIAGNOSIS — L89322 Pressure ulcer of left buttock, stage 2: Secondary | ICD-10-CM | POA: Diagnosis not present

## 2020-01-18 DIAGNOSIS — R296 Repeated falls: Secondary | ICD-10-CM | POA: Diagnosis not present

## 2020-01-18 DIAGNOSIS — I771 Stricture of artery: Secondary | ICD-10-CM | POA: Diagnosis not present

## 2020-01-18 DIAGNOSIS — G2581 Restless legs syndrome: Secondary | ICD-10-CM | POA: Diagnosis not present

## 2020-01-18 DIAGNOSIS — E559 Vitamin D deficiency, unspecified: Secondary | ICD-10-CM | POA: Diagnosis not present

## 2020-01-18 DIAGNOSIS — E785 Hyperlipidemia, unspecified: Secondary | ICD-10-CM | POA: Diagnosis not present

## 2020-01-18 DIAGNOSIS — I6521 Occlusion and stenosis of right carotid artery: Secondary | ICD-10-CM | POA: Diagnosis not present

## 2020-01-19 DIAGNOSIS — R300 Dysuria: Secondary | ICD-10-CM | POA: Diagnosis not present

## 2020-01-19 DIAGNOSIS — E1169 Type 2 diabetes mellitus with other specified complication: Secondary | ICD-10-CM | POA: Diagnosis not present

## 2020-01-19 DIAGNOSIS — E611 Iron deficiency: Secondary | ICD-10-CM | POA: Diagnosis not present

## 2020-01-19 DIAGNOSIS — L89322 Pressure ulcer of left buttock, stage 2: Secondary | ICD-10-CM | POA: Diagnosis not present

## 2020-01-19 DIAGNOSIS — E1151 Type 2 diabetes mellitus with diabetic peripheral angiopathy without gangrene: Secondary | ICD-10-CM | POA: Diagnosis not present

## 2020-01-19 DIAGNOSIS — G2581 Restless legs syndrome: Secondary | ICD-10-CM | POA: Diagnosis not present

## 2020-01-19 DIAGNOSIS — Z87891 Personal history of nicotine dependence: Secondary | ICD-10-CM | POA: Diagnosis not present

## 2020-01-19 DIAGNOSIS — R296 Repeated falls: Secondary | ICD-10-CM | POA: Diagnosis not present

## 2020-01-19 DIAGNOSIS — E559 Vitamin D deficiency, unspecified: Secondary | ICD-10-CM | POA: Diagnosis not present

## 2020-01-19 DIAGNOSIS — I771 Stricture of artery: Secondary | ICD-10-CM | POA: Diagnosis not present

## 2020-01-19 DIAGNOSIS — Z7984 Long term (current) use of oral hypoglycemic drugs: Secondary | ICD-10-CM | POA: Diagnosis not present

## 2020-01-19 DIAGNOSIS — I6521 Occlusion and stenosis of right carotid artery: Secondary | ICD-10-CM | POA: Diagnosis not present

## 2020-01-19 DIAGNOSIS — E785 Hyperlipidemia, unspecified: Secondary | ICD-10-CM | POA: Diagnosis not present

## 2020-01-19 DIAGNOSIS — G20 Parkinson's disease: Secondary | ICD-10-CM | POA: Diagnosis not present

## 2020-01-19 DIAGNOSIS — E876 Hypokalemia: Secondary | ICD-10-CM | POA: Diagnosis not present

## 2020-01-20 ENCOUNTER — Other Ambulatory Visit: Payer: Medicare Other | Admitting: Hospice

## 2020-01-20 ENCOUNTER — Other Ambulatory Visit: Payer: Self-pay

## 2020-01-20 DIAGNOSIS — E611 Iron deficiency: Secondary | ICD-10-CM | POA: Diagnosis not present

## 2020-01-20 DIAGNOSIS — L89322 Pressure ulcer of left buttock, stage 2: Secondary | ICD-10-CM | POA: Diagnosis not present

## 2020-01-20 DIAGNOSIS — Z515 Encounter for palliative care: Secondary | ICD-10-CM | POA: Diagnosis not present

## 2020-01-20 DIAGNOSIS — Z87891 Personal history of nicotine dependence: Secondary | ICD-10-CM | POA: Diagnosis not present

## 2020-01-20 DIAGNOSIS — G20 Parkinson's disease: Secondary | ICD-10-CM | POA: Diagnosis not present

## 2020-01-20 DIAGNOSIS — R296 Repeated falls: Secondary | ICD-10-CM | POA: Diagnosis not present

## 2020-01-20 DIAGNOSIS — I771 Stricture of artery: Secondary | ICD-10-CM | POA: Diagnosis not present

## 2020-01-20 DIAGNOSIS — E785 Hyperlipidemia, unspecified: Secondary | ICD-10-CM | POA: Diagnosis not present

## 2020-01-20 DIAGNOSIS — G2581 Restless legs syndrome: Secondary | ICD-10-CM | POA: Diagnosis not present

## 2020-01-20 DIAGNOSIS — E1151 Type 2 diabetes mellitus with diabetic peripheral angiopathy without gangrene: Secondary | ICD-10-CM | POA: Diagnosis not present

## 2020-01-20 DIAGNOSIS — E559 Vitamin D deficiency, unspecified: Secondary | ICD-10-CM | POA: Diagnosis not present

## 2020-01-20 DIAGNOSIS — Z7984 Long term (current) use of oral hypoglycemic drugs: Secondary | ICD-10-CM | POA: Diagnosis not present

## 2020-01-20 DIAGNOSIS — I6521 Occlusion and stenosis of right carotid artery: Secondary | ICD-10-CM | POA: Diagnosis not present

## 2020-01-20 NOTE — Progress Notes (Signed)
[facility] Consult Note Telephone: [phone]  Fax: [phone]  PATIENT NAME: Rodney Love DOB: 10/13/1945 MRN: [id number]  PRIMARY CARE PROVIDER:   [name], MD [name], MD [address] [place],  [place] [address]  [place]: [name], MD [name], [place],  [place] [address]  [place] NP/Dr WalterPharr  RESPONSIBLE PARTY:Self: [phone] h best number to call [place] [phone] Extended Emergency Contact Information Primary Emergency Contact: [place], [place] Phone: [phone] Mobile Phone: [phone] Relation: Daughter Secondary Emergency Contact: Ellender Hose Address: [address] [place], [place] Phone: [phone] Mobile Phone: [phone] Relation: Spouse [name] - daughter [phone]  I met face to face with patient and family in home/facility.  TELEHEALTH VISIT STATEMENT Due to the COVID-19 crisis, this visit was done via zoom call from my office. It was initiated and consented to by this patient and/or family.   RECOMMENDATIONS/PLAN:   NP was reached by [name] reporting that patient was agitated last night, hitting his wife and not able to sleep. Patient did not take his Seroquel.  CODE STATUS: Code status reviewed. Patient is a full code.  Family open to future discussions on CODE STATUS.  GOALS OF CARE:Family wishes for patient toregain his strength andambulate around the houseas he previously did. Goals of care include to maximize quality of life and symptom management.  Follow up Palliative Care Visit: Palliative care will continue to follow for goals of care clarification and symptom management.Follow-up in 2 months.  Functional decline/Symptom Management: Agitation: Patient was sleeping during  telehealth visit; he denied pain/discomfort and went right back to sleep. [name] reported that lab work and UA was done yesterday; results pending. She also reported that they give patient 12.5 mg Seroquel and that [name]  said his doctor instructed to give 25 mg if needed for agitation. NP encouraged [name] that patient should receive 25 mg this evening and daily going forward for control of agitation. Bedtime routines and sleep hygiene also discussed. She verbalized understanding to call with any concerns or uncontrolled agitation.   Previous notes 01/17/2020:  Generalizedbodyweakness, gait instability/recent fall: Fall last week with no injury.  Patient recently evaluated for PT OT; OT expected to soon commence weekly and PT 2 times a week x4 weeks.  Education provided on use of rolling walker for supports; fall precautions.Encouraged low energy utilization activities and a balance of rest and activities to conserve energy. Patient with jerkiness and stiffness. [name] reported patient has an order for Home health services. Stage II sacral wound related to prolonged sitting.  Repositioning encouraged to relieve pressure and prevent further skin breakdown/promote healing.  Home health nurse to follow wound.  Family reported nurse will draw labs next week - A1c, CBC.    From previous visit 12/31/2019:He was last seen in the ED 11/11/2019 for syncope. Pertinent labs and diagnostics were reassuring and patient was discharged home. Patientcurrentlyhas a loop recorder to rule out arrhythymia related to recurring blackout/syncope. Patient is incontinent of bowel and bladder, hallucinates sometimes, forgetful and sometimes does not remember his spouse and children. Education provided on Parkinson diseaseas progressive and terminal, what the family should expect. They verbalized understanding that symptoms may worsen and patient in the futuremayfurther declinecognitively, not be able to walk or swallow  safely. Appetite is good. Current weight 160Ibs Height 6 feet 2 inches.  Patient is  followed by neurologist. Appointment with Neurologist this [place].Sinemet recently reduced 25/175m five times a day to four times a day and 50/2066mat bedtime.Spouse manages his medication and patient is compliant with his medications as he is given.   Family /Caregiver/Community Supports:Patient lives as home with spouse; has two daughters who live close by and are involved in his care. Strong family support network identified. Patient used to work as crRunner, broadcasting/film/video guPaediatric nursend instrumentalist.Christian faith and application to [employer] sources of strength for family. I spentone 30 minutes providing this consultation; time includes time spent with patient/family, chart review, provider coordination, and documentation. More than 50% of the time in this consultation was spent on coordinating communication  CHIEF COMPLAIN/HISTORY OF PRESENT ILLNESS:Rodney S Blevinsis a [age].o.malewith multiple medical problems including Parkinson's disease, HTN, CAD, Syncope. Palliative Care was asked to follow this patient by consultation request ofPharr, WaThayer JewMD/Lonnie KeDelilah ShanPto help address advance care planning and goals of care.  CODE STATUS:Full code  PPS:40%  HOSPICE ELIGIBILITY/DIAGNOSIS: TBD  PAST MEDICAL HISTORY:  Past Medical History:  Diagnosis Date  . Arthritis   . Constipation    OCCASIONAL  . Coronary artery disease   . Diabetes mellitus without complication ([facility]  . Environmental allergies   . Hyperlipidemia   . Hypertension   . Inguinal hernia   . Iron deficiency   . Nocturia   . Parkinson disease ([facility]  . Peripheral arterial disease ([facility]    SOCIAL HX:  Social History   Tobacco Use  . Smoking status: Former Smoker    Quit date: 04/02/1991    Years since quitting: 28.8  . Smokeless tobacco: Former [employer] developer  Quit date: 08/06/1991    Substance Use Topics  . Alcohol use: No    ALLERGIES: No Known Allergies   PERTINENT MEDICATIONS:  Outpatient Encounter Medications as of 01/20/2020  Medication Sig  . amoxicillin-clavulanate (AUGMENTIN) 875-125 MG tablet Take 1 tablet by mouth 2 (two) times daily.  . Ascorbic Acid (VITAMIN C) 100 MG tablet Take 100 mg by mouth in the morning and at bedtime.   . Marland Kitchenspirin EC 81 MG tablet Take 81 mg by mouth daily.  . Marland Kitchentorvastatin (LIPITOR) 20 MG tablet Take 20 mg by mouth daily.   . carbidopa-levodopa (SINEMET CR) 50-200 MG tablet Take 1 tablet by mouth at bedtime.  . Carbidopa-Levodopa ER (SINEMET CR) 25-100 MG tablet controlled release Take 1 tablet by mouth in the morning, at noon, in the evening, and at bedtime.  . Cyanocobalamin (VITAMIN B 12 PO) Take 1 tablet by mouth daily.  . ferrous sulfate 324 MG TBEC Take 324 mg by mouth in the morning and at bedtime.   . metFORMIN (GLUCOPHAGE) 850 MG tablet Take 850 mg by mouth daily with breakfast.  . ONETOUCH ULTRA test strip   . QUEtiapine (SEROQUEL) 25 MG tablet 1/2 tablet at bed as needed.  May take additional 1/2 in the day prn (Patient taking differently: Take 12.5 mg by mouth at bedtime as needed (sleep). )  . tamsulosin (FLOMAX) 0.4 MG CAPS capsule Take 1 capsule by mouth every evening.   . [DISCONTINUED] pramipexole (MIRAPEX) 0.125 MG tablet Take 0.125 mg by mouth at bedtime.   Facility-Administered Encounter Medications as of 01/20/2020  Medication  . lidocaine-EPINEPHrine (XYLOCAINE W/EPI) 1 %-1:100000 (with pres) injection 10 mL    LiTeodoro SprayNP

## 2020-01-21 DIAGNOSIS — G2581 Restless legs syndrome: Secondary | ICD-10-CM | POA: Diagnosis not present

## 2020-01-21 DIAGNOSIS — G20 Parkinson's disease: Secondary | ICD-10-CM | POA: Diagnosis not present

## 2020-01-21 DIAGNOSIS — E785 Hyperlipidemia, unspecified: Secondary | ICD-10-CM | POA: Diagnosis not present

## 2020-01-21 DIAGNOSIS — Z7984 Long term (current) use of oral hypoglycemic drugs: Secondary | ICD-10-CM | POA: Diagnosis not present

## 2020-01-21 DIAGNOSIS — E611 Iron deficiency: Secondary | ICD-10-CM | POA: Diagnosis not present

## 2020-01-21 DIAGNOSIS — R296 Repeated falls: Secondary | ICD-10-CM | POA: Diagnosis not present

## 2020-01-21 DIAGNOSIS — E1151 Type 2 diabetes mellitus with diabetic peripheral angiopathy without gangrene: Secondary | ICD-10-CM | POA: Diagnosis not present

## 2020-01-21 DIAGNOSIS — I6521 Occlusion and stenosis of right carotid artery: Secondary | ICD-10-CM | POA: Diagnosis not present

## 2020-01-21 DIAGNOSIS — L89322 Pressure ulcer of left buttock, stage 2: Secondary | ICD-10-CM | POA: Diagnosis not present

## 2020-01-21 DIAGNOSIS — E559 Vitamin D deficiency, unspecified: Secondary | ICD-10-CM | POA: Diagnosis not present

## 2020-01-21 DIAGNOSIS — I771 Stricture of artery: Secondary | ICD-10-CM | POA: Diagnosis not present

## 2020-01-21 DIAGNOSIS — Z87891 Personal history of nicotine dependence: Secondary | ICD-10-CM | POA: Diagnosis not present

## 2020-01-24 DIAGNOSIS — I10 Essential (primary) hypertension: Secondary | ICD-10-CM | POA: Diagnosis not present

## 2020-01-24 DIAGNOSIS — E1169 Type 2 diabetes mellitus with other specified complication: Secondary | ICD-10-CM | POA: Diagnosis not present

## 2020-01-24 DIAGNOSIS — E7800 Pure hypercholesterolemia, unspecified: Secondary | ICD-10-CM | POA: Diagnosis not present

## 2020-01-24 DIAGNOSIS — G20 Parkinson's disease: Secondary | ICD-10-CM | POA: Diagnosis not present

## 2020-01-25 DIAGNOSIS — Z7984 Long term (current) use of oral hypoglycemic drugs: Secondary | ICD-10-CM | POA: Diagnosis not present

## 2020-01-25 DIAGNOSIS — E1151 Type 2 diabetes mellitus with diabetic peripheral angiopathy without gangrene: Secondary | ICD-10-CM | POA: Diagnosis not present

## 2020-01-25 DIAGNOSIS — I771 Stricture of artery: Secondary | ICD-10-CM | POA: Diagnosis not present

## 2020-01-25 DIAGNOSIS — L89322 Pressure ulcer of left buttock, stage 2: Secondary | ICD-10-CM | POA: Diagnosis not present

## 2020-01-25 DIAGNOSIS — E611 Iron deficiency: Secondary | ICD-10-CM | POA: Diagnosis not present

## 2020-01-25 DIAGNOSIS — G20 Parkinson's disease: Secondary | ICD-10-CM | POA: Diagnosis not present

## 2020-01-25 DIAGNOSIS — R296 Repeated falls: Secondary | ICD-10-CM | POA: Diagnosis not present

## 2020-01-25 DIAGNOSIS — E785 Hyperlipidemia, unspecified: Secondary | ICD-10-CM | POA: Diagnosis not present

## 2020-01-25 DIAGNOSIS — E559 Vitamin D deficiency, unspecified: Secondary | ICD-10-CM | POA: Diagnosis not present

## 2020-01-25 DIAGNOSIS — G2581 Restless legs syndrome: Secondary | ICD-10-CM | POA: Diagnosis not present

## 2020-01-25 DIAGNOSIS — Z87891 Personal history of nicotine dependence: Secondary | ICD-10-CM | POA: Diagnosis not present

## 2020-01-25 DIAGNOSIS — I6521 Occlusion and stenosis of right carotid artery: Secondary | ICD-10-CM | POA: Diagnosis not present

## 2020-01-26 DIAGNOSIS — I6521 Occlusion and stenosis of right carotid artery: Secondary | ICD-10-CM | POA: Diagnosis not present

## 2020-01-26 DIAGNOSIS — E1151 Type 2 diabetes mellitus with diabetic peripheral angiopathy without gangrene: Secondary | ICD-10-CM | POA: Diagnosis not present

## 2020-01-26 DIAGNOSIS — Z87891 Personal history of nicotine dependence: Secondary | ICD-10-CM | POA: Diagnosis not present

## 2020-01-26 DIAGNOSIS — Z7984 Long term (current) use of oral hypoglycemic drugs: Secondary | ICD-10-CM | POA: Diagnosis not present

## 2020-01-26 DIAGNOSIS — E785 Hyperlipidemia, unspecified: Secondary | ICD-10-CM | POA: Diagnosis not present

## 2020-01-26 DIAGNOSIS — L89322 Pressure ulcer of left buttock, stage 2: Secondary | ICD-10-CM | POA: Diagnosis not present

## 2020-01-26 DIAGNOSIS — R296 Repeated falls: Secondary | ICD-10-CM | POA: Diagnosis not present

## 2020-01-26 DIAGNOSIS — G2581 Restless legs syndrome: Secondary | ICD-10-CM | POA: Diagnosis not present

## 2020-01-26 DIAGNOSIS — E611 Iron deficiency: Secondary | ICD-10-CM | POA: Diagnosis not present

## 2020-01-26 DIAGNOSIS — I771 Stricture of artery: Secondary | ICD-10-CM | POA: Diagnosis not present

## 2020-01-26 DIAGNOSIS — G20 Parkinson's disease: Secondary | ICD-10-CM | POA: Diagnosis not present

## 2020-01-26 DIAGNOSIS — E559 Vitamin D deficiency, unspecified: Secondary | ICD-10-CM | POA: Diagnosis not present

## 2020-01-27 DIAGNOSIS — I6521 Occlusion and stenosis of right carotid artery: Secondary | ICD-10-CM | POA: Diagnosis not present

## 2020-01-27 DIAGNOSIS — Z87891 Personal history of nicotine dependence: Secondary | ICD-10-CM | POA: Diagnosis not present

## 2020-01-27 DIAGNOSIS — L89322 Pressure ulcer of left buttock, stage 2: Secondary | ICD-10-CM | POA: Diagnosis not present

## 2020-01-27 DIAGNOSIS — E785 Hyperlipidemia, unspecified: Secondary | ICD-10-CM | POA: Diagnosis not present

## 2020-01-27 DIAGNOSIS — E559 Vitamin D deficiency, unspecified: Secondary | ICD-10-CM | POA: Diagnosis not present

## 2020-01-27 DIAGNOSIS — R296 Repeated falls: Secondary | ICD-10-CM | POA: Diagnosis not present

## 2020-01-27 DIAGNOSIS — G20 Parkinson's disease: Secondary | ICD-10-CM | POA: Diagnosis not present

## 2020-01-27 DIAGNOSIS — G2581 Restless legs syndrome: Secondary | ICD-10-CM | POA: Diagnosis not present

## 2020-01-27 DIAGNOSIS — I771 Stricture of artery: Secondary | ICD-10-CM | POA: Diagnosis not present

## 2020-01-27 DIAGNOSIS — Z7984 Long term (current) use of oral hypoglycemic drugs: Secondary | ICD-10-CM | POA: Diagnosis not present

## 2020-01-27 DIAGNOSIS — E1151 Type 2 diabetes mellitus with diabetic peripheral angiopathy without gangrene: Secondary | ICD-10-CM | POA: Diagnosis not present

## 2020-01-27 DIAGNOSIS — E611 Iron deficiency: Secondary | ICD-10-CM | POA: Diagnosis not present

## 2020-01-31 DIAGNOSIS — I771 Stricture of artery: Secondary | ICD-10-CM | POA: Diagnosis not present

## 2020-01-31 DIAGNOSIS — Z87891 Personal history of nicotine dependence: Secondary | ICD-10-CM | POA: Diagnosis not present

## 2020-01-31 DIAGNOSIS — R296 Repeated falls: Secondary | ICD-10-CM | POA: Diagnosis not present

## 2020-01-31 DIAGNOSIS — I6521 Occlusion and stenosis of right carotid artery: Secondary | ICD-10-CM | POA: Diagnosis not present

## 2020-01-31 DIAGNOSIS — Z7984 Long term (current) use of oral hypoglycemic drugs: Secondary | ICD-10-CM | POA: Diagnosis not present

## 2020-01-31 DIAGNOSIS — E559 Vitamin D deficiency, unspecified: Secondary | ICD-10-CM | POA: Diagnosis not present

## 2020-01-31 DIAGNOSIS — L89322 Pressure ulcer of left buttock, stage 2: Secondary | ICD-10-CM | POA: Diagnosis not present

## 2020-01-31 DIAGNOSIS — E611 Iron deficiency: Secondary | ICD-10-CM | POA: Diagnosis not present

## 2020-01-31 DIAGNOSIS — E785 Hyperlipidemia, unspecified: Secondary | ICD-10-CM | POA: Diagnosis not present

## 2020-01-31 DIAGNOSIS — G20 Parkinson's disease: Secondary | ICD-10-CM | POA: Diagnosis not present

## 2020-01-31 DIAGNOSIS — G2581 Restless legs syndrome: Secondary | ICD-10-CM | POA: Diagnosis not present

## 2020-01-31 DIAGNOSIS — E1151 Type 2 diabetes mellitus with diabetic peripheral angiopathy without gangrene: Secondary | ICD-10-CM | POA: Diagnosis not present

## 2020-02-01 DIAGNOSIS — I771 Stricture of artery: Secondary | ICD-10-CM | POA: Diagnosis not present

## 2020-02-01 DIAGNOSIS — E1151 Type 2 diabetes mellitus with diabetic peripheral angiopathy without gangrene: Secondary | ICD-10-CM | POA: Diagnosis not present

## 2020-02-01 DIAGNOSIS — E559 Vitamin D deficiency, unspecified: Secondary | ICD-10-CM | POA: Diagnosis not present

## 2020-02-01 DIAGNOSIS — G20 Parkinson's disease: Secondary | ICD-10-CM | POA: Diagnosis not present

## 2020-02-01 DIAGNOSIS — Z87891 Personal history of nicotine dependence: Secondary | ICD-10-CM | POA: Diagnosis not present

## 2020-02-01 DIAGNOSIS — G2581 Restless legs syndrome: Secondary | ICD-10-CM | POA: Diagnosis not present

## 2020-02-01 DIAGNOSIS — I6521 Occlusion and stenosis of right carotid artery: Secondary | ICD-10-CM | POA: Diagnosis not present

## 2020-02-01 DIAGNOSIS — R296 Repeated falls: Secondary | ICD-10-CM | POA: Diagnosis not present

## 2020-02-01 DIAGNOSIS — Z7984 Long term (current) use of oral hypoglycemic drugs: Secondary | ICD-10-CM | POA: Diagnosis not present

## 2020-02-01 DIAGNOSIS — E785 Hyperlipidemia, unspecified: Secondary | ICD-10-CM | POA: Diagnosis not present

## 2020-02-01 DIAGNOSIS — L89322 Pressure ulcer of left buttock, stage 2: Secondary | ICD-10-CM | POA: Diagnosis not present

## 2020-02-01 DIAGNOSIS — E611 Iron deficiency: Secondary | ICD-10-CM | POA: Diagnosis not present

## 2020-02-02 DIAGNOSIS — Z87891 Personal history of nicotine dependence: Secondary | ICD-10-CM | POA: Diagnosis not present

## 2020-02-02 DIAGNOSIS — G2581 Restless legs syndrome: Secondary | ICD-10-CM | POA: Diagnosis not present

## 2020-02-02 DIAGNOSIS — Z7984 Long term (current) use of oral hypoglycemic drugs: Secondary | ICD-10-CM | POA: Diagnosis not present

## 2020-02-02 DIAGNOSIS — E611 Iron deficiency: Secondary | ICD-10-CM | POA: Diagnosis not present

## 2020-02-02 DIAGNOSIS — E1151 Type 2 diabetes mellitus with diabetic peripheral angiopathy without gangrene: Secondary | ICD-10-CM | POA: Diagnosis not present

## 2020-02-02 DIAGNOSIS — L89322 Pressure ulcer of left buttock, stage 2: Secondary | ICD-10-CM | POA: Diagnosis not present

## 2020-02-02 DIAGNOSIS — R296 Repeated falls: Secondary | ICD-10-CM | POA: Diagnosis not present

## 2020-02-02 DIAGNOSIS — I771 Stricture of artery: Secondary | ICD-10-CM | POA: Diagnosis not present

## 2020-02-02 DIAGNOSIS — E785 Hyperlipidemia, unspecified: Secondary | ICD-10-CM | POA: Diagnosis not present

## 2020-02-02 DIAGNOSIS — I6521 Occlusion and stenosis of right carotid artery: Secondary | ICD-10-CM | POA: Diagnosis not present

## 2020-02-02 DIAGNOSIS — G20 Parkinson's disease: Secondary | ICD-10-CM | POA: Diagnosis not present

## 2020-02-02 DIAGNOSIS — E559 Vitamin D deficiency, unspecified: Secondary | ICD-10-CM | POA: Diagnosis not present

## 2020-02-03 DIAGNOSIS — E611 Iron deficiency: Secondary | ICD-10-CM | POA: Diagnosis not present

## 2020-02-03 DIAGNOSIS — I771 Stricture of artery: Secondary | ICD-10-CM | POA: Diagnosis not present

## 2020-02-03 DIAGNOSIS — E785 Hyperlipidemia, unspecified: Secondary | ICD-10-CM | POA: Diagnosis not present

## 2020-02-03 DIAGNOSIS — I6521 Occlusion and stenosis of right carotid artery: Secondary | ICD-10-CM | POA: Diagnosis not present

## 2020-02-03 DIAGNOSIS — L89322 Pressure ulcer of left buttock, stage 2: Secondary | ICD-10-CM | POA: Diagnosis not present

## 2020-02-03 DIAGNOSIS — Z87891 Personal history of nicotine dependence: Secondary | ICD-10-CM | POA: Diagnosis not present

## 2020-02-03 DIAGNOSIS — E559 Vitamin D deficiency, unspecified: Secondary | ICD-10-CM | POA: Diagnosis not present

## 2020-02-03 DIAGNOSIS — E1151 Type 2 diabetes mellitus with diabetic peripheral angiopathy without gangrene: Secondary | ICD-10-CM | POA: Diagnosis not present

## 2020-02-03 DIAGNOSIS — R296 Repeated falls: Secondary | ICD-10-CM | POA: Diagnosis not present

## 2020-02-03 DIAGNOSIS — G20 Parkinson's disease: Secondary | ICD-10-CM | POA: Diagnosis not present

## 2020-02-03 DIAGNOSIS — G2581 Restless legs syndrome: Secondary | ICD-10-CM | POA: Diagnosis not present

## 2020-02-03 DIAGNOSIS — Z7984 Long term (current) use of oral hypoglycemic drugs: Secondary | ICD-10-CM | POA: Diagnosis not present

## 2020-02-04 ENCOUNTER — Encounter (HOSPITAL_BASED_OUTPATIENT_CLINIC_OR_DEPARTMENT_OTHER): Payer: Medicare Other | Admitting: Internal Medicine

## 2020-02-05 LAB — CUP PACEART REMOTE DEVICE CHECK
Date Time Interrogation Session: 20211105215754
Implantable Pulse Generator Implant Date: 20210830

## 2020-02-07 ENCOUNTER — Ambulatory Visit (INDEPENDENT_AMBULATORY_CARE_PROVIDER_SITE_OTHER): Payer: Medicare Other

## 2020-02-07 DIAGNOSIS — G20 Parkinson's disease: Secondary | ICD-10-CM | POA: Diagnosis not present

## 2020-02-07 DIAGNOSIS — E785 Hyperlipidemia, unspecified: Secondary | ICD-10-CM | POA: Diagnosis not present

## 2020-02-07 DIAGNOSIS — E559 Vitamin D deficiency, unspecified: Secondary | ICD-10-CM | POA: Diagnosis not present

## 2020-02-07 DIAGNOSIS — R55 Syncope and collapse: Secondary | ICD-10-CM

## 2020-02-07 DIAGNOSIS — I6521 Occlusion and stenosis of right carotid artery: Secondary | ICD-10-CM | POA: Diagnosis not present

## 2020-02-07 DIAGNOSIS — Z87891 Personal history of nicotine dependence: Secondary | ICD-10-CM | POA: Diagnosis not present

## 2020-02-07 DIAGNOSIS — E611 Iron deficiency: Secondary | ICD-10-CM | POA: Diagnosis not present

## 2020-02-07 DIAGNOSIS — E1151 Type 2 diabetes mellitus with diabetic peripheral angiopathy without gangrene: Secondary | ICD-10-CM | POA: Diagnosis not present

## 2020-02-07 DIAGNOSIS — L89322 Pressure ulcer of left buttock, stage 2: Secondary | ICD-10-CM | POA: Diagnosis not present

## 2020-02-07 DIAGNOSIS — R296 Repeated falls: Secondary | ICD-10-CM | POA: Diagnosis not present

## 2020-02-07 DIAGNOSIS — G2581 Restless legs syndrome: Secondary | ICD-10-CM | POA: Diagnosis not present

## 2020-02-07 DIAGNOSIS — I771 Stricture of artery: Secondary | ICD-10-CM | POA: Diagnosis not present

## 2020-02-07 DIAGNOSIS — Z7984 Long term (current) use of oral hypoglycemic drugs: Secondary | ICD-10-CM | POA: Diagnosis not present

## 2020-02-07 NOTE — Progress Notes (Signed)
Carelink Summary Report / Loop Recorder 

## 2020-02-09 DIAGNOSIS — I6521 Occlusion and stenosis of right carotid artery: Secondary | ICD-10-CM | POA: Diagnosis not present

## 2020-02-09 DIAGNOSIS — G20 Parkinson's disease: Secondary | ICD-10-CM | POA: Diagnosis not present

## 2020-02-09 DIAGNOSIS — G2581 Restless legs syndrome: Secondary | ICD-10-CM | POA: Diagnosis not present

## 2020-02-09 DIAGNOSIS — E785 Hyperlipidemia, unspecified: Secondary | ICD-10-CM | POA: Diagnosis not present

## 2020-02-09 DIAGNOSIS — E559 Vitamin D deficiency, unspecified: Secondary | ICD-10-CM | POA: Diagnosis not present

## 2020-02-09 DIAGNOSIS — E611 Iron deficiency: Secondary | ICD-10-CM | POA: Diagnosis not present

## 2020-02-09 DIAGNOSIS — E1151 Type 2 diabetes mellitus with diabetic peripheral angiopathy without gangrene: Secondary | ICD-10-CM | POA: Diagnosis not present

## 2020-02-09 DIAGNOSIS — Z7984 Long term (current) use of oral hypoglycemic drugs: Secondary | ICD-10-CM | POA: Diagnosis not present

## 2020-02-09 DIAGNOSIS — Z87891 Personal history of nicotine dependence: Secondary | ICD-10-CM | POA: Diagnosis not present

## 2020-02-09 DIAGNOSIS — R296 Repeated falls: Secondary | ICD-10-CM | POA: Diagnosis not present

## 2020-02-09 DIAGNOSIS — L89322 Pressure ulcer of left buttock, stage 2: Secondary | ICD-10-CM | POA: Diagnosis not present

## 2020-02-09 DIAGNOSIS — I771 Stricture of artery: Secondary | ICD-10-CM | POA: Diagnosis not present

## 2020-02-10 ENCOUNTER — Other Ambulatory Visit: Payer: Self-pay | Admitting: Neurology

## 2020-02-10 DIAGNOSIS — E785 Hyperlipidemia, unspecified: Secondary | ICD-10-CM | POA: Diagnosis not present

## 2020-02-10 DIAGNOSIS — Z7984 Long term (current) use of oral hypoglycemic drugs: Secondary | ICD-10-CM | POA: Diagnosis not present

## 2020-02-10 DIAGNOSIS — E611 Iron deficiency: Secondary | ICD-10-CM | POA: Diagnosis not present

## 2020-02-10 DIAGNOSIS — G2581 Restless legs syndrome: Secondary | ICD-10-CM | POA: Diagnosis not present

## 2020-02-10 DIAGNOSIS — I6521 Occlusion and stenosis of right carotid artery: Secondary | ICD-10-CM | POA: Diagnosis not present

## 2020-02-10 DIAGNOSIS — Z87891 Personal history of nicotine dependence: Secondary | ICD-10-CM | POA: Diagnosis not present

## 2020-02-10 DIAGNOSIS — G20 Parkinson's disease: Secondary | ICD-10-CM | POA: Diagnosis not present

## 2020-02-10 DIAGNOSIS — L89322 Pressure ulcer of left buttock, stage 2: Secondary | ICD-10-CM | POA: Diagnosis not present

## 2020-02-10 DIAGNOSIS — R296 Repeated falls: Secondary | ICD-10-CM | POA: Diagnosis not present

## 2020-02-10 DIAGNOSIS — E559 Vitamin D deficiency, unspecified: Secondary | ICD-10-CM | POA: Diagnosis not present

## 2020-02-10 DIAGNOSIS — E1151 Type 2 diabetes mellitus with diabetic peripheral angiopathy without gangrene: Secondary | ICD-10-CM | POA: Diagnosis not present

## 2020-02-10 DIAGNOSIS — I771 Stricture of artery: Secondary | ICD-10-CM | POA: Diagnosis not present

## 2020-02-11 DIAGNOSIS — E611 Iron deficiency: Secondary | ICD-10-CM | POA: Diagnosis not present

## 2020-02-11 DIAGNOSIS — Z7984 Long term (current) use of oral hypoglycemic drugs: Secondary | ICD-10-CM | POA: Diagnosis not present

## 2020-02-11 DIAGNOSIS — R296 Repeated falls: Secondary | ICD-10-CM | POA: Diagnosis not present

## 2020-02-11 DIAGNOSIS — G2581 Restless legs syndrome: Secondary | ICD-10-CM | POA: Diagnosis not present

## 2020-02-11 DIAGNOSIS — L89322 Pressure ulcer of left buttock, stage 2: Secondary | ICD-10-CM | POA: Diagnosis not present

## 2020-02-11 DIAGNOSIS — I771 Stricture of artery: Secondary | ICD-10-CM | POA: Diagnosis not present

## 2020-02-11 DIAGNOSIS — Z87891 Personal history of nicotine dependence: Secondary | ICD-10-CM | POA: Diagnosis not present

## 2020-02-11 DIAGNOSIS — E785 Hyperlipidemia, unspecified: Secondary | ICD-10-CM | POA: Diagnosis not present

## 2020-02-11 DIAGNOSIS — E559 Vitamin D deficiency, unspecified: Secondary | ICD-10-CM | POA: Diagnosis not present

## 2020-02-11 DIAGNOSIS — I6521 Occlusion and stenosis of right carotid artery: Secondary | ICD-10-CM | POA: Diagnosis not present

## 2020-02-11 DIAGNOSIS — E1151 Type 2 diabetes mellitus with diabetic peripheral angiopathy without gangrene: Secondary | ICD-10-CM | POA: Diagnosis not present

## 2020-02-11 DIAGNOSIS — G20 Parkinson's disease: Secondary | ICD-10-CM | POA: Diagnosis not present

## 2020-02-14 DIAGNOSIS — I6521 Occlusion and stenosis of right carotid artery: Secondary | ICD-10-CM | POA: Diagnosis not present

## 2020-02-14 DIAGNOSIS — L89322 Pressure ulcer of left buttock, stage 2: Secondary | ICD-10-CM | POA: Diagnosis not present

## 2020-02-14 DIAGNOSIS — E785 Hyperlipidemia, unspecified: Secondary | ICD-10-CM | POA: Diagnosis not present

## 2020-02-14 DIAGNOSIS — Z87891 Personal history of nicotine dependence: Secondary | ICD-10-CM | POA: Diagnosis not present

## 2020-02-14 DIAGNOSIS — G20 Parkinson's disease: Secondary | ICD-10-CM | POA: Diagnosis not present

## 2020-02-14 DIAGNOSIS — R296 Repeated falls: Secondary | ICD-10-CM | POA: Diagnosis not present

## 2020-02-14 DIAGNOSIS — E1151 Type 2 diabetes mellitus with diabetic peripheral angiopathy without gangrene: Secondary | ICD-10-CM | POA: Diagnosis not present

## 2020-02-14 DIAGNOSIS — E611 Iron deficiency: Secondary | ICD-10-CM | POA: Diagnosis not present

## 2020-02-14 DIAGNOSIS — Z7984 Long term (current) use of oral hypoglycemic drugs: Secondary | ICD-10-CM | POA: Diagnosis not present

## 2020-02-14 DIAGNOSIS — E559 Vitamin D deficiency, unspecified: Secondary | ICD-10-CM | POA: Diagnosis not present

## 2020-02-14 DIAGNOSIS — I771 Stricture of artery: Secondary | ICD-10-CM | POA: Diagnosis not present

## 2020-02-14 DIAGNOSIS — G2581 Restless legs syndrome: Secondary | ICD-10-CM | POA: Diagnosis not present

## 2020-02-15 NOTE — Progress Notes (Deleted)
Assessment/Plan:   1.  Parkinsons Disease             -Continue carbidopa/levodopa 25/100 CR, 1 tablet 4 times per day  2.  GAD/depression             -Refuses Lexapro  3.  B12 deficiency             -takes B12 supplement  4.  RLS             -On iron supplementation for iron deficiency.  5.  Insomnia/hallucinations             -discussed quetiapine.    He is only on 12.5 mg at bedtime.  He has become agitated and aggressive at times.  6.  Leg pain             -Discussed with patient/daughter that I do not think that leg pain is the same as restless leg.  Restless leg is not painful.  Parkinson's disease also is generally not painful.  Asked him to follow-up with primary care in that regard.  Subjective:   Rodney Love was seen today in follow up for Parkinsons disease.  My previous records were reviewed prior to todays visit as well as outside records available to me.  Patient's daughter present and supplements history.  Pt denies falls.  Pt denies lightheadedness, near syncope.  Patient was started on low-dose quetiapine for sleep and hallucinations last visit.  That was in July.  I got an email from his daughter October 21 stating that they had only been on it for 2 weeks and that it was not helping and they wanted other medication options.  I told them that we could adjust the dose of quetiapine if they would like, but there really were not other medications that would help both insomnia and hallucinations together.  I have not heard back from them since.  Noted that the palliative care nurse practitioner went to the home the same day that they had emailed me and daughter stated that the patient was agitated and hitting his wife.  Current prescribed movement disorder medications: ***Carbidopa/levodopa 25/100 CR, 1 tablet 4 times per day (6am/10am/2pm/6pm) Carbidopa/levodopa 50/200 at bedtime Quetiapine, 12.5 mg at bedtime (started last visit) B12 supplement  Ferrous  sulfate, 325 mg twice per day with vitamin C  PREVIOUS MEDICATIONS: {Parkinson's RX:18200}  ALLERGIES:  No Known Allergies  CURRENT MEDICATIONS:  Outpatient Encounter Medications as of 02/18/2020  Medication Sig  . amoxicillin-clavulanate (AUGMENTIN) 875-125 MG tablet Take 1 tablet by mouth 2 (two) times daily.  . Ascorbic Acid (VITAMIN C) 100 MG tablet Take 100 mg by mouth in the morning and at bedtime.   Marland Kitchen aspirin EC 81 MG tablet Take 81 mg by mouth daily.  Marland Kitchen atorvastatin (LIPITOR) 20 MG tablet Take 20 mg by mouth daily.   . carbidopa-levodopa (SINEMET CR) 50-200 MG tablet TAKE 1 TABLET BY MOUTH AT BEDTIME  . Carbidopa-Levodopa ER (SINEMET CR) 25-100 MG tablet controlled release Take 1 tablet by mouth in the morning, at noon, in the evening, and at bedtime.  . Cyanocobalamin (VITAMIN B 12 PO) Take 1 tablet by mouth daily.  . ferrous sulfate 324 MG TBEC Take 324 mg by mouth in the morning and at bedtime.   . metFORMIN (GLUCOPHAGE) 850 MG tablet Take 850 mg by mouth daily with breakfast.  . ONETOUCH ULTRA test strip   . QUEtiapine (SEROQUEL) 25 MG tablet 1/2 tablet at bed as  needed.  May take additional 1/2 in the day prn (Patient taking differently: Take 12.5 mg by mouth at bedtime as needed (sleep). )  . tamsulosin (FLOMAX) 0.4 MG CAPS capsule Take 1 capsule by mouth every evening.   . [DISCONTINUED] pramipexole (MIRAPEX) 0.125 MG tablet Take 0.125 mg by mouth at bedtime.   Facility-Administered Encounter Medications as of 02/18/2020  Medication  . lidocaine-EPINEPHrine (XYLOCAINE W/EPI) 1 %-1:100000 (with pres) injection 10 mL    Objective:   PHYSICAL EXAMINATION:    VITALS:  There were no vitals filed for this visit.  GEN:  The patient appears stated age and is in NAD. HEENT:  Normocephalic, atraumatic.  The mucous membranes are moist. The superficial temporal arteries are without ropiness or tenderness. CV:  RRR Lungs:  CTAB Neck/HEME:  There are no carotid bruits  bilaterally.  Neurological examination:  Orientation: The patient is alert and oriented x3. Cranial nerves: There is good facial symmetry with*** facial hypomimia. The speech is fluent and clear. Soft palate rises symmetrically and there is no tongue deviation. Hearing is intact to conversational tone. Sensation: Sensation is intact to light touch throughout Motor: Strength is at least antigravity x4.  Movement examination: Tone: There is ***tone in the *** Abnormal movements: ***Rare left upper extremity rest tremor. Coordination:  There is *** decremation with RAM's, *** Gait and Station: The patient requires assistance out of the wheelchair.  He is given a walker.  He walks fairly well with his walker.    I have reviewed and interpreted the following labs independently    Chemistry      Component Value Date/Time   NA 140 11/15/2019 1800   K 3.4 (L) 11/15/2019 1800   CL 105 11/15/2019 1800   CO2 25 11/15/2019 1800   BUN 14 11/15/2019 1800   CREATININE 0.90 11/15/2019 1800      Component Value Date/Time   CALCIUM 9.1 11/15/2019 1800   ALKPHOS 79 09/03/2019 1531   AST 15 09/03/2019 1531   ALT 10 09/03/2019 1531   BILITOT 1.3 (H) 09/03/2019 1531       Lab Results  Component Value Date   WBC 5.2 11/15/2019   HGB 13.8 11/15/2019   HCT 42.4 11/15/2019   MCV 97.7 11/15/2019   PLT 198 11/15/2019    No results found for: TSH   Total time spent on today's visit was ***30 minutes, including both face-to-face time and nonface-to-face time.  Time included that spent on review of records (prior notes available to me/labs/imaging if pertinent), discussing treatment and goals, answering patient's questions and coordinating care.  Cc:  [name], MD

## 2020-02-18 ENCOUNTER — Ambulatory Visit: Payer: Medicare Other | Admitting: Neurology

## 2020-02-18 DIAGNOSIS — R296 Repeated falls: Secondary | ICD-10-CM | POA: Diagnosis not present

## 2020-02-18 DIAGNOSIS — Z87891 Personal history of nicotine dependence: Secondary | ICD-10-CM | POA: Diagnosis not present

## 2020-02-18 DIAGNOSIS — G2581 Restless legs syndrome: Secondary | ICD-10-CM | POA: Diagnosis not present

## 2020-02-18 DIAGNOSIS — E1151 Type 2 diabetes mellitus with diabetic peripheral angiopathy without gangrene: Secondary | ICD-10-CM | POA: Diagnosis not present

## 2020-02-18 DIAGNOSIS — E785 Hyperlipidemia, unspecified: Secondary | ICD-10-CM | POA: Diagnosis not present

## 2020-02-18 DIAGNOSIS — E611 Iron deficiency: Secondary | ICD-10-CM | POA: Diagnosis not present

## 2020-02-18 DIAGNOSIS — G20 Parkinson's disease: Secondary | ICD-10-CM | POA: Diagnosis not present

## 2020-02-18 DIAGNOSIS — Z7984 Long term (current) use of oral hypoglycemic drugs: Secondary | ICD-10-CM | POA: Diagnosis not present

## 2020-02-18 DIAGNOSIS — L89322 Pressure ulcer of left buttock, stage 2: Secondary | ICD-10-CM | POA: Diagnosis not present

## 2020-02-18 DIAGNOSIS — I771 Stricture of artery: Secondary | ICD-10-CM | POA: Diagnosis not present

## 2020-02-18 DIAGNOSIS — E559 Vitamin D deficiency, unspecified: Secondary | ICD-10-CM | POA: Diagnosis not present

## 2020-02-18 DIAGNOSIS — I6521 Occlusion and stenosis of right carotid artery: Secondary | ICD-10-CM | POA: Diagnosis not present

## 2020-02-19 ENCOUNTER — Other Ambulatory Visit: Payer: Self-pay | Admitting: Neurology

## 2020-02-19 DIAGNOSIS — E559 Vitamin D deficiency, unspecified: Secondary | ICD-10-CM | POA: Diagnosis not present

## 2020-02-19 DIAGNOSIS — E611 Iron deficiency: Secondary | ICD-10-CM | POA: Diagnosis not present

## 2020-02-19 DIAGNOSIS — I6521 Occlusion and stenosis of right carotid artery: Secondary | ICD-10-CM | POA: Diagnosis not present

## 2020-02-19 DIAGNOSIS — I771 Stricture of artery: Secondary | ICD-10-CM | POA: Diagnosis not present

## 2020-02-19 DIAGNOSIS — E785 Hyperlipidemia, unspecified: Secondary | ICD-10-CM | POA: Diagnosis not present

## 2020-02-19 DIAGNOSIS — R296 Repeated falls: Secondary | ICD-10-CM | POA: Diagnosis not present

## 2020-02-19 DIAGNOSIS — E1151 Type 2 diabetes mellitus with diabetic peripheral angiopathy without gangrene: Secondary | ICD-10-CM | POA: Diagnosis not present

## 2020-02-19 DIAGNOSIS — L89322 Pressure ulcer of left buttock, stage 2: Secondary | ICD-10-CM | POA: Diagnosis not present

## 2020-02-19 DIAGNOSIS — G2581 Restless legs syndrome: Secondary | ICD-10-CM | POA: Diagnosis not present

## 2020-02-19 DIAGNOSIS — Z87891 Personal history of nicotine dependence: Secondary | ICD-10-CM | POA: Diagnosis not present

## 2020-02-19 DIAGNOSIS — G20 Parkinson's disease: Secondary | ICD-10-CM | POA: Diagnosis not present

## 2020-02-19 DIAGNOSIS — Z7984 Long term (current) use of oral hypoglycemic drugs: Secondary | ICD-10-CM | POA: Diagnosis not present

## 2020-02-21 DIAGNOSIS — E611 Iron deficiency: Secondary | ICD-10-CM | POA: Diagnosis not present

## 2020-02-21 DIAGNOSIS — I771 Stricture of artery: Secondary | ICD-10-CM | POA: Diagnosis not present

## 2020-02-21 DIAGNOSIS — E1151 Type 2 diabetes mellitus with diabetic peripheral angiopathy without gangrene: Secondary | ICD-10-CM | POA: Diagnosis not present

## 2020-02-21 DIAGNOSIS — Z7984 Long term (current) use of oral hypoglycemic drugs: Secondary | ICD-10-CM | POA: Diagnosis not present

## 2020-02-21 DIAGNOSIS — Z87891 Personal history of nicotine dependence: Secondary | ICD-10-CM | POA: Diagnosis not present

## 2020-02-21 DIAGNOSIS — G20 Parkinson's disease: Secondary | ICD-10-CM | POA: Diagnosis not present

## 2020-02-21 DIAGNOSIS — G2581 Restless legs syndrome: Secondary | ICD-10-CM | POA: Diagnosis not present

## 2020-02-21 DIAGNOSIS — E785 Hyperlipidemia, unspecified: Secondary | ICD-10-CM | POA: Diagnosis not present

## 2020-02-21 DIAGNOSIS — E559 Vitamin D deficiency, unspecified: Secondary | ICD-10-CM | POA: Diagnosis not present

## 2020-02-21 DIAGNOSIS — L89322 Pressure ulcer of left buttock, stage 2: Secondary | ICD-10-CM | POA: Diagnosis not present

## 2020-02-21 DIAGNOSIS — R296 Repeated falls: Secondary | ICD-10-CM | POA: Diagnosis not present

## 2020-02-21 DIAGNOSIS — I6521 Occlusion and stenosis of right carotid artery: Secondary | ICD-10-CM | POA: Diagnosis not present

## 2020-02-21 NOTE — Telephone Encounter (Signed)
Rx(s) sent to pharmacy electronically.  

## 2020-02-22 DIAGNOSIS — G20 Parkinson's disease: Secondary | ICD-10-CM | POA: Diagnosis not present

## 2020-02-22 DIAGNOSIS — E611 Iron deficiency: Secondary | ICD-10-CM | POA: Diagnosis not present

## 2020-02-22 DIAGNOSIS — E1151 Type 2 diabetes mellitus with diabetic peripheral angiopathy without gangrene: Secondary | ICD-10-CM | POA: Diagnosis not present

## 2020-02-22 DIAGNOSIS — G2581 Restless legs syndrome: Secondary | ICD-10-CM | POA: Diagnosis not present

## 2020-02-22 DIAGNOSIS — I771 Stricture of artery: Secondary | ICD-10-CM | POA: Diagnosis not present

## 2020-02-22 DIAGNOSIS — Z7984 Long term (current) use of oral hypoglycemic drugs: Secondary | ICD-10-CM | POA: Diagnosis not present

## 2020-02-22 DIAGNOSIS — I6521 Occlusion and stenosis of right carotid artery: Secondary | ICD-10-CM | POA: Diagnosis not present

## 2020-02-22 DIAGNOSIS — E559 Vitamin D deficiency, unspecified: Secondary | ICD-10-CM | POA: Diagnosis not present

## 2020-02-22 DIAGNOSIS — R296 Repeated falls: Secondary | ICD-10-CM | POA: Diagnosis not present

## 2020-02-22 DIAGNOSIS — Z87891 Personal history of nicotine dependence: Secondary | ICD-10-CM | POA: Diagnosis not present

## 2020-02-22 DIAGNOSIS — L89322 Pressure ulcer of left buttock, stage 2: Secondary | ICD-10-CM | POA: Diagnosis not present

## 2020-02-22 DIAGNOSIS — E785 Hyperlipidemia, unspecified: Secondary | ICD-10-CM | POA: Diagnosis not present

## 2020-02-23 DIAGNOSIS — E559 Vitamin D deficiency, unspecified: Secondary | ICD-10-CM | POA: Diagnosis not present

## 2020-02-23 DIAGNOSIS — Z87891 Personal history of nicotine dependence: Secondary | ICD-10-CM | POA: Diagnosis not present

## 2020-02-23 DIAGNOSIS — I771 Stricture of artery: Secondary | ICD-10-CM | POA: Diagnosis not present

## 2020-02-23 DIAGNOSIS — G20 Parkinson's disease: Secondary | ICD-10-CM | POA: Diagnosis not present

## 2020-02-23 DIAGNOSIS — E611 Iron deficiency: Secondary | ICD-10-CM | POA: Diagnosis not present

## 2020-02-23 DIAGNOSIS — E785 Hyperlipidemia, unspecified: Secondary | ICD-10-CM | POA: Diagnosis not present

## 2020-02-23 DIAGNOSIS — G2581 Restless legs syndrome: Secondary | ICD-10-CM | POA: Diagnosis not present

## 2020-02-23 DIAGNOSIS — L89322 Pressure ulcer of left buttock, stage 2: Secondary | ICD-10-CM | POA: Diagnosis not present

## 2020-02-23 DIAGNOSIS — I6521 Occlusion and stenosis of right carotid artery: Secondary | ICD-10-CM | POA: Diagnosis not present

## 2020-02-23 DIAGNOSIS — R296 Repeated falls: Secondary | ICD-10-CM | POA: Diagnosis not present

## 2020-02-23 DIAGNOSIS — Z7984 Long term (current) use of oral hypoglycemic drugs: Secondary | ICD-10-CM | POA: Diagnosis not present

## 2020-02-23 DIAGNOSIS — E1151 Type 2 diabetes mellitus with diabetic peripheral angiopathy without gangrene: Secondary | ICD-10-CM | POA: Diagnosis not present

## 2020-02-28 DIAGNOSIS — E1151 Type 2 diabetes mellitus with diabetic peripheral angiopathy without gangrene: Secondary | ICD-10-CM | POA: Diagnosis not present

## 2020-02-28 DIAGNOSIS — R296 Repeated falls: Secondary | ICD-10-CM | POA: Diagnosis not present

## 2020-02-28 DIAGNOSIS — E611 Iron deficiency: Secondary | ICD-10-CM | POA: Diagnosis not present

## 2020-02-28 DIAGNOSIS — L89322 Pressure ulcer of left buttock, stage 2: Secondary | ICD-10-CM | POA: Diagnosis not present

## 2020-02-28 DIAGNOSIS — I771 Stricture of artery: Secondary | ICD-10-CM | POA: Diagnosis not present

## 2020-02-28 DIAGNOSIS — G20 Parkinson's disease: Secondary | ICD-10-CM | POA: Diagnosis not present

## 2020-02-28 DIAGNOSIS — Z87891 Personal history of nicotine dependence: Secondary | ICD-10-CM | POA: Diagnosis not present

## 2020-02-28 DIAGNOSIS — E559 Vitamin D deficiency, unspecified: Secondary | ICD-10-CM | POA: Diagnosis not present

## 2020-02-28 DIAGNOSIS — E785 Hyperlipidemia, unspecified: Secondary | ICD-10-CM | POA: Diagnosis not present

## 2020-02-28 DIAGNOSIS — G2581 Restless legs syndrome: Secondary | ICD-10-CM | POA: Diagnosis not present

## 2020-02-28 DIAGNOSIS — Z7984 Long term (current) use of oral hypoglycemic drugs: Secondary | ICD-10-CM | POA: Diagnosis not present

## 2020-02-28 DIAGNOSIS — I6521 Occlusion and stenosis of right carotid artery: Secondary | ICD-10-CM | POA: Diagnosis not present

## 2020-03-01 DIAGNOSIS — E559 Vitamin D deficiency, unspecified: Secondary | ICD-10-CM | POA: Diagnosis not present

## 2020-03-01 DIAGNOSIS — Z87891 Personal history of nicotine dependence: Secondary | ICD-10-CM | POA: Diagnosis not present

## 2020-03-01 DIAGNOSIS — E611 Iron deficiency: Secondary | ICD-10-CM | POA: Diagnosis not present

## 2020-03-01 DIAGNOSIS — G20 Parkinson's disease: Secondary | ICD-10-CM | POA: Diagnosis not present

## 2020-03-01 DIAGNOSIS — R296 Repeated falls: Secondary | ICD-10-CM | POA: Diagnosis not present

## 2020-03-01 DIAGNOSIS — E876 Hypokalemia: Secondary | ICD-10-CM | POA: Diagnosis not present

## 2020-03-01 DIAGNOSIS — I771 Stricture of artery: Secondary | ICD-10-CM | POA: Diagnosis not present

## 2020-03-01 DIAGNOSIS — I6521 Occlusion and stenosis of right carotid artery: Secondary | ICD-10-CM | POA: Diagnosis not present

## 2020-03-01 DIAGNOSIS — I2510 Atherosclerotic heart disease of native coronary artery without angina pectoris: Secondary | ICD-10-CM | POA: Diagnosis not present

## 2020-03-01 DIAGNOSIS — D649 Anemia, unspecified: Secondary | ICD-10-CM | POA: Diagnosis not present

## 2020-03-01 DIAGNOSIS — E785 Hyperlipidemia, unspecified: Secondary | ICD-10-CM | POA: Diagnosis not present

## 2020-03-01 DIAGNOSIS — Z7984 Long term (current) use of oral hypoglycemic drugs: Secondary | ICD-10-CM | POA: Diagnosis not present

## 2020-03-01 DIAGNOSIS — G2581 Restless legs syndrome: Secondary | ICD-10-CM | POA: Diagnosis not present

## 2020-03-01 DIAGNOSIS — L89322 Pressure ulcer of left buttock, stage 2: Secondary | ICD-10-CM | POA: Diagnosis not present

## 2020-03-01 DIAGNOSIS — E1151 Type 2 diabetes mellitus with diabetic peripheral angiopathy without gangrene: Secondary | ICD-10-CM | POA: Diagnosis not present

## 2020-03-01 DIAGNOSIS — E1169 Type 2 diabetes mellitus with other specified complication: Secondary | ICD-10-CM | POA: Diagnosis not present

## 2020-03-07 DIAGNOSIS — G2581 Restless legs syndrome: Secondary | ICD-10-CM | POA: Diagnosis not present

## 2020-03-07 DIAGNOSIS — I771 Stricture of artery: Secondary | ICD-10-CM | POA: Diagnosis not present

## 2020-03-07 DIAGNOSIS — Z7984 Long term (current) use of oral hypoglycemic drugs: Secondary | ICD-10-CM | POA: Diagnosis not present

## 2020-03-07 DIAGNOSIS — G20 Parkinson's disease: Secondary | ICD-10-CM | POA: Diagnosis not present

## 2020-03-07 DIAGNOSIS — E1151 Type 2 diabetes mellitus with diabetic peripheral angiopathy without gangrene: Secondary | ICD-10-CM | POA: Diagnosis not present

## 2020-03-07 DIAGNOSIS — E611 Iron deficiency: Secondary | ICD-10-CM | POA: Diagnosis not present

## 2020-03-07 DIAGNOSIS — Z87891 Personal history of nicotine dependence: Secondary | ICD-10-CM | POA: Diagnosis not present

## 2020-03-07 DIAGNOSIS — R296 Repeated falls: Secondary | ICD-10-CM | POA: Diagnosis not present

## 2020-03-07 DIAGNOSIS — E559 Vitamin D deficiency, unspecified: Secondary | ICD-10-CM | POA: Diagnosis not present

## 2020-03-07 DIAGNOSIS — I6521 Occlusion and stenosis of right carotid artery: Secondary | ICD-10-CM | POA: Diagnosis not present

## 2020-03-07 DIAGNOSIS — L89322 Pressure ulcer of left buttock, stage 2: Secondary | ICD-10-CM | POA: Diagnosis not present

## 2020-03-07 DIAGNOSIS — E785 Hyperlipidemia, unspecified: Secondary | ICD-10-CM | POA: Diagnosis not present

## 2020-03-08 DIAGNOSIS — E785 Hyperlipidemia, unspecified: Secondary | ICD-10-CM | POA: Diagnosis not present

## 2020-03-08 DIAGNOSIS — Z7984 Long term (current) use of oral hypoglycemic drugs: Secondary | ICD-10-CM | POA: Diagnosis not present

## 2020-03-08 DIAGNOSIS — G2581 Restless legs syndrome: Secondary | ICD-10-CM | POA: Diagnosis not present

## 2020-03-08 DIAGNOSIS — E1151 Type 2 diabetes mellitus with diabetic peripheral angiopathy without gangrene: Secondary | ICD-10-CM | POA: Diagnosis not present

## 2020-03-08 DIAGNOSIS — R296 Repeated falls: Secondary | ICD-10-CM | POA: Diagnosis not present

## 2020-03-08 DIAGNOSIS — I6521 Occlusion and stenosis of right carotid artery: Secondary | ICD-10-CM | POA: Diagnosis not present

## 2020-03-08 DIAGNOSIS — L89322 Pressure ulcer of left buttock, stage 2: Secondary | ICD-10-CM | POA: Diagnosis not present

## 2020-03-08 DIAGNOSIS — I771 Stricture of artery: Secondary | ICD-10-CM | POA: Diagnosis not present

## 2020-03-08 DIAGNOSIS — E611 Iron deficiency: Secondary | ICD-10-CM | POA: Diagnosis not present

## 2020-03-08 DIAGNOSIS — Z87891 Personal history of nicotine dependence: Secondary | ICD-10-CM | POA: Diagnosis not present

## 2020-03-08 DIAGNOSIS — G20 Parkinson's disease: Secondary | ICD-10-CM | POA: Diagnosis not present

## 2020-03-08 DIAGNOSIS — E559 Vitamin D deficiency, unspecified: Secondary | ICD-10-CM | POA: Diagnosis not present

## 2020-03-09 DIAGNOSIS — I771 Stricture of artery: Secondary | ICD-10-CM | POA: Diagnosis not present

## 2020-03-09 DIAGNOSIS — E611 Iron deficiency: Secondary | ICD-10-CM | POA: Diagnosis not present

## 2020-03-09 DIAGNOSIS — G20 Parkinson's disease: Secondary | ICD-10-CM | POA: Diagnosis not present

## 2020-03-09 DIAGNOSIS — E785 Hyperlipidemia, unspecified: Secondary | ICD-10-CM | POA: Diagnosis not present

## 2020-03-09 DIAGNOSIS — Z7984 Long term (current) use of oral hypoglycemic drugs: Secondary | ICD-10-CM | POA: Diagnosis not present

## 2020-03-09 DIAGNOSIS — L89322 Pressure ulcer of left buttock, stage 2: Secondary | ICD-10-CM | POA: Diagnosis not present

## 2020-03-09 DIAGNOSIS — G2581 Restless legs syndrome: Secondary | ICD-10-CM | POA: Diagnosis not present

## 2020-03-09 DIAGNOSIS — R296 Repeated falls: Secondary | ICD-10-CM | POA: Diagnosis not present

## 2020-03-09 DIAGNOSIS — Z87891 Personal history of nicotine dependence: Secondary | ICD-10-CM | POA: Diagnosis not present

## 2020-03-09 DIAGNOSIS — E1151 Type 2 diabetes mellitus with diabetic peripheral angiopathy without gangrene: Secondary | ICD-10-CM | POA: Diagnosis not present

## 2020-03-09 DIAGNOSIS — E559 Vitamin D deficiency, unspecified: Secondary | ICD-10-CM | POA: Diagnosis not present

## 2020-03-09 DIAGNOSIS — I6521 Occlusion and stenosis of right carotid artery: Secondary | ICD-10-CM | POA: Diagnosis not present

## 2020-03-11 LAB — CUP PACEART REMOTE DEVICE CHECK
Date Time Interrogation Session: 20211208215624
Implantable Pulse Generator Implant Date: 20210830

## 2020-03-13 ENCOUNTER — Ambulatory Visit (INDEPENDENT_AMBULATORY_CARE_PROVIDER_SITE_OTHER): Payer: Medicare Other

## 2020-03-13 DIAGNOSIS — R55 Syncope and collapse: Secondary | ICD-10-CM

## 2020-03-14 NOTE — Progress Notes (Signed)
Assessment/Plan:   1.  Parkinsons Disease             -Continue carbidopa/levodopa 25/100 CR, 1 tablet 4 times per day  2.  GAD/depression             -Refuses Lexapro  3.  B12 deficiency             -takes B12 supplement  4.  RLS             -On iron supplementation for iron deficiency.  5.  Insomnia/hallucinations/agitation             -patient having behavioral issues and wife trying to take care of him in the home.  We did talk about the fact that the atypical antipsychotic medications are not indicated for dementia related psychosis and increase risk of mortality in the elderly, usually because of infectious or  cardiac related etiologies.  We discussed prolongation of the QT interval and what that means.  We discussed the black box warning in detail and how it applies in this case.  Family believes that QOL is important and they have tried nonpharmacologic therapies (maintaining schedule, attempting proper sleep hydration, redirecting, etc) without success.  They would like to continue to caregive in the home and believe that this is the only way, but worry that pt is a threat to self/others.  After this long discussion, family decided to try to continue  the medication as they feel that the benefits outweigh the risks in this case.  Increase quetiapine, 25 mg, 1/2 in the AM and 1 at bed.  May add another 1/2 tablet in the late afternoon if need   Subjective:   Rodney Love was seen today in follow up for Parkinsons disease.  My previous records were reviewed prior to todays visit as well as outside records available to me.  Patient's daughter present and supplements history.  Pt has had falls but just finished home PT and it helped.  Pt denies lightheadedness, near syncope.  Patient was started on low-dose quetiapine for sleep and hallucinations last visit.  That was in July.  I got an email from his daughter October 21 stating that they had only been on it for 2 weeks and that  it was not helping and they wanted other medication options.  I told them that we could adjust the dose of quetiapine if they would like, but there really were not other medications that would help both insomnia and hallucinations together.  I have not heard back from them since.   Wife states that he is getting up to go to the BR but will go back to sleep.   Noted that the palliative care nurse practitioner went to the home the same day that they had emailed me and daughter stated that the patient was agitated and hitting his wife.  He is still agitated.  Doesn't recognize wife at times.  That happens 1-2 times per week.  Has days that he sleeps much of the day.  Current prescribed movement disorder medications: Carbidopa/levodopa 25/100 CR, 1 tablet 4 times per day (6am/10am/2pm/6pm) Carbidopa/levodopa 50/200 at bedtime Quetiapine, 12.5 mg at bedtime (started last visit) B12 supplement  Ferrous sulfate, 325 mg twice per day with vitamin C   ALLERGIES:  No Known Allergies  CURRENT MEDICATIONS:  Outpatient Encounter Medications as of 03/17/2020  Medication Sig  . Ascorbic Acid (VITAMIN C) 100 MG tablet Take 100 mg by mouth in the morning  and at bedtime.   Marland Kitchen aspirin EC 81 MG tablet Take 81 mg by mouth daily.  Marland Kitchen atorvastatin (LIPITOR) 20 MG tablet Take 20 mg by mouth daily.   . carbidopa-levodopa (SINEMET CR) 50-200 MG tablet TAKE 1 TABLET BY MOUTH AT BEDTIME  . Carbidopa-Levodopa ER (SINEMET CR) 25-100 MG tablet controlled release TAKE 1 TABLET BY MOUTH EVERY MORNING, AT NOON, IN THE EVENING. AND AT BEDTIME  . Cyanocobalamin (VITAMIN B 12 PO) Take 1 tablet by mouth daily.  . ferrous sulfate 324 MG TBEC Take 324 mg by mouth in the morning and at bedtime.   . metFORMIN (GLUCOPHAGE) 850 MG tablet Take 850 mg by mouth 3 (three) times a week. Mon/Wed/Fri  . ONETOUCH ULTRA test strip   . tamsulosin (FLOMAX) 0.4 MG CAPS capsule Take 1 capsule by mouth every evening.   . [DISCONTINUED] QUEtiapine  (SEROQUEL) 25 MG tablet 1/2 tablet at bed as needed.  May take additional 1/2 in the day prn (Patient taking differently: Take 12.5 mg by mouth at bedtime as needed (sleep).)  . QUEtiapine (SEROQUEL) 25 MG tablet 1/2 in the AM and 1 at bed.  May add another 1/2 tablet in the late afternoon if need  . [DISCONTINUED] amoxicillin-clavulanate (AUGMENTIN) 875-125 MG tablet Take 1 tablet by mouth 2 (two) times daily. (Patient not taking: Reported on 03/17/2020)  . [DISCONTINUED] pramipexole (MIRAPEX) 0.125 MG tablet Take 0.125 mg by mouth at bedtime.   Facility-Administered Encounter Medications as of 03/17/2020  Medication  . lidocaine-EPINEPHrine (XYLOCAINE W/EPI) 1 %-1:100000 (with pres) injection 10 mL    Objective:   PHYSICAL EXAMINATION:    VITALS:   Vitals:   03/17/20 1036  BP: 104/68  Pulse: 77  SpO2: 98%  Weight: 164 lb (74.4 kg)  Height: 6\' 1"  (1.854 m)   Wt Readings from Last 3 Encounters:  03/17/20 164 lb (74.4 kg)  11/11/19 166 lb (75.3 kg)  10/15/19 166 lb 3.2 oz (75.4 kg)     GEN:  The patient appears stated age and is in NAD. HEENT:  Normocephalic, atraumatic.  The mucous membranes are moist. The superficial temporal arteries are without ropiness or tenderness. CV:  RRR Lungs:  CTAB Neck/HEME:  There are no carotid bruits bilaterally.  Neurological examination:  Orientation: The patient is alert and oriented to person/place Cranial nerves: There is good facial symmetry with facial hypomimia. The speech is fluent and clear. Soft palate rises symmetrically and there is no tongue deviation. Hearing is intact to conversational tone. Sensation: Sensation is intact to light touch throughout Motor: Strength is at least antigravity x4.  Movement examination: Tone: There is mod increased tone in the LUE Abnormal movements: Rare left upper extremity rest tremor. Coordination:  There is  decremation with RAM's, with any form of RAMS, including alternating supination and  pronation of the forearm, hand opening and closing, finger taps, heel taps and toe taps, L>R Gait and Station: The patient pushes off of the chair.  He does fairly well with the walker.    I have reviewed and interpreted the following labs independently    Chemistry      Component Value Date/Time   NA 140 11/15/2019 1800   K 3.4 (L) 11/15/2019 1800   CL 105 11/15/2019 1800   CO2 25 11/15/2019 1800   BUN 14 11/15/2019 1800   CREATININE 0.90 11/15/2019 1800      Component Value Date/Time   CALCIUM 9.1 11/15/2019 1800   ALKPHOS 79 09/03/2019 1531  AST 15 09/03/2019 1531   ALT 10 09/03/2019 1531   BILITOT 1.3 (H) 09/03/2019 1531       Lab Results  Component Value Date   WBC 5.2 11/15/2019   HGB 13.8 11/15/2019   HCT 42.4 11/15/2019   MCV 97.7 11/15/2019   PLT 198 11/15/2019    No results found for: TSH   Total time spent on today's visit was 30 minutes, including both face-to-face time and nonface-to-face time.  Time included that spent on review of records (prior notes available to me/labs/imaging if pertinent), discussing treatment and goals, answering patient's questions and coordinating care.  Cc:  [name], MD

## 2020-03-15 ENCOUNTER — Other Ambulatory Visit: Payer: Self-pay

## 2020-03-15 ENCOUNTER — Other Ambulatory Visit: Payer: Medicare Other | Admitting: Hospice

## 2020-03-15 DIAGNOSIS — E559 Vitamin D deficiency, unspecified: Secondary | ICD-10-CM | POA: Diagnosis not present

## 2020-03-15 DIAGNOSIS — E1151 Type 2 diabetes mellitus with diabetic peripheral angiopathy without gangrene: Secondary | ICD-10-CM | POA: Diagnosis not present

## 2020-03-15 DIAGNOSIS — G20 Parkinson's disease: Secondary | ICD-10-CM

## 2020-03-15 DIAGNOSIS — Z515 Encounter for palliative care: Secondary | ICD-10-CM

## 2020-03-15 DIAGNOSIS — Z87891 Personal history of nicotine dependence: Secondary | ICD-10-CM | POA: Diagnosis not present

## 2020-03-15 DIAGNOSIS — E785 Hyperlipidemia, unspecified: Secondary | ICD-10-CM | POA: Diagnosis not present

## 2020-03-15 DIAGNOSIS — I6521 Occlusion and stenosis of right carotid artery: Secondary | ICD-10-CM | POA: Diagnosis not present

## 2020-03-15 DIAGNOSIS — E611 Iron deficiency: Secondary | ICD-10-CM | POA: Diagnosis not present

## 2020-03-15 DIAGNOSIS — Z7984 Long term (current) use of oral hypoglycemic drugs: Secondary | ICD-10-CM | POA: Diagnosis not present

## 2020-03-15 DIAGNOSIS — I771 Stricture of artery: Secondary | ICD-10-CM | POA: Diagnosis not present

## 2020-03-15 DIAGNOSIS — R296 Repeated falls: Secondary | ICD-10-CM | POA: Diagnosis not present

## 2020-03-15 DIAGNOSIS — G2581 Restless legs syndrome: Secondary | ICD-10-CM | POA: Diagnosis not present

## 2020-03-15 NOTE — Progress Notes (Signed)
[facility] Consult Note Telephone: [phone]  Fax: [phone]  PATIENT NAME: Rodney Love DOB: [birth year] MRN: [id number]  PRIMARY CARE PROVIDER:   [name], MD Rodney Pretty, MD [address] [place],  [place] [address]  [place]: Rodney Pretty, MD Rodney Love, [place],  [place] [address]  [place] NP/Dr WalterPharr  RESPONSIBLE PARTY:Self: [phone] h best number to call [place] [phone] Extended Emergency Contact Information Primary Emergency Contact: Rodney, Love Phone: [phone] Mobile Phone: [phone] Relation: Daughter Secondary Emergency Contact: Rodney Love Address: [address] [place], [place] Phone: [phone] Mobile Phone: [phone] Relation: Spouse [name] - daughter [phone]  I met face to face with patient and family in home/facility.    RECOMMENDATIONS/PLAN:  Visit consisted of building trust and discussions on Palliative Medicine as specialized medical care for people living with serious illness, aimed at facilitating better quality of life through symptoms relief, assisting with advance care plan and establishing goals of care.  Visit consisted of counseling and education dealing with the complex and emotionally intense issues of symptom management and palliative care in the setting of serious and potentially life-threatening illness.  Music and christian faith help patient and spouse to cope.  Rodney Love avoids too much thinking while [employer] and keeps busy as coping strategies.  Palliative care team will continue to support patient, patient's family, and medical team.  CODE STATUS:Code status reviewed. Patient is a full code. Family open to future discussions on CODE STATUS.  GOALS OF CARE:Goals of care  include to maximize quality of life and symptom management. Follow up Palliative Care Visit: Palliative care will continue to follow for goals of care clarification and symptom management.Follow-up in3  months.  Functional decline/Symptom Management:Significant improvement since last visit.  Patient now ambulates within and around the house.  Education on use of rolling walker to provide support and help prevent a fall.  Balance of rest and performance activity.  Patient able to engage in short meaningful conversation. Agitation: Well managed with Seroquel. Continue 25 mg Seroquel every evening. Bedtime routines and sleep hygiene also discussed.   Ongoing speech therapy for speech volume..  Stage II sacral wound completely healed, per spouse. Appetite is good.  Current weight 166Ibs Height 6 feet 2 inches, up from 162 pounds 2 months ago. Spouse manages his medication and patient is compliant with his medications as he is given.  Family /Caregiver/Community Supports:Patient lives as home with spouse; has two daughters who live close by and are involved in his care. Strong family support network identified. Patient used to work as Runner, broadcasting/film/video - Development worker, international aid. I spentone30 minutesproviding this consultation; time includes time spent with patient/family, chart review, provider coordination, and documentation. More than 50% of the time in this consultation was spent on coordinating communication  CHIEF COMPLAIN/HISTORY OF PRESENT ILLNESS:Cipriano S Blevinsis a 74 y.o.malewith multiple medical problems including Parkinson's disease, HTN, CAD, Syncope. Palliative Care was asked to follow this patient by consultation request ofPharr, Thayer Jew, MD/Lonnie Delilah Shan NPto help address advance care planning and goals of care.  CODE STATUS:Full code  PPS:40%  HOSPICE ELIGIBILITY/DIAGNOSIS: TBD  PAST MEDICAL HISTORY:  Past Medical History:  Diagnosis  Date  . Arthritis   . Constipation    OCCASIONAL  . Coronary artery disease   . Diabetes mellitus without complication ([facility])   . Environmental  allergies   . Hyperlipidemia   . Hypertension   . Inguinal hernia   . Iron deficiency   . Nocturia   . Parkinson disease ([facility])   . Peripheral arterial disease ([facility])     SOCIAL HX:  Social History   Tobacco Use  . Smoking status: Former Smoker    Quit date: 04/02/1991    Years since quitting: 28.9  . Smokeless tobacco: Former Systems developer    Quit date: 08/06/1991  Substance Use Topics  . Alcohol use: No    ALLERGIES: No Known Allergies   PERTINENT MEDICATIONS:  Outpatient Encounter Medications as of 03/15/2020  Medication Sig  . amoxicillin-clavulanate (AUGMENTIN) 875-125 MG tablet Take 1 tablet by mouth 2 (two) times daily.  . Ascorbic Acid (VITAMIN C) 100 MG tablet Take 100 mg by mouth in the morning and at bedtime.   Marland Kitchen aspirin EC 81 MG tablet Take 81 mg by mouth daily.  Marland Kitchen atorvastatin (LIPITOR) 20 MG tablet Take 20 mg by mouth daily.   . carbidopa-levodopa (SINEMET CR) 50-200 MG tablet TAKE 1 TABLET BY MOUTH AT BEDTIME  . Carbidopa-Levodopa ER (SINEMET CR) 25-100 MG tablet controlled release TAKE 1 TABLET BY MOUTH EVERY MORNING, AT NOON, IN THE EVENING. AND AT BEDTIME  . Cyanocobalamin (VITAMIN B 12 PO) Take 1 tablet by mouth daily.  . ferrous sulfate 324 MG TBEC Take 324 mg by mouth in the morning and at bedtime.   . metFORMIN (GLUCOPHAGE) 850 MG tablet Take 850 mg by mouth daily with breakfast.  . ONETOUCH ULTRA test strip   . QUEtiapine (SEROQUEL) 25 MG tablet 1/2 tablet at bed as needed.  May take additional 1/2 in the day prn (Patient taking differently: Take 12.5 mg by mouth at bedtime as needed (sleep). )  . tamsulosin (FLOMAX) 0.4 MG CAPS capsule Take 1 capsule by mouth every evening.   . [DISCONTINUED] pramipexole (MIRAPEX) 0.125 MG tablet Take 0.125 mg by mouth at bedtime.   Facility-Administered Encounter Medications as of  03/15/2020  Medication  . lidocaine-EPINEPHrine (XYLOCAINE W/EPI) 1 %-1:100000 (with pres) injection 10 mL    PHYSICAL EXAM/ROS:  Height 6 feet 1 inch Weight 166.6Ibs  General: In no acute distress Cardiovascular: regular rate and rhythm, no chest pain reported Pulmonary: no cough, no shortness of breath, clear ant/post fields, normal respiratory effort Abdomen: soft, non tender, positive bowel sounds in all quadrants; no report of constipation GU: denies dysuria, no suprapubic tenderness Extremities: no edema, no joint deformities Skin:  no rashes to exposed skin Neurological: Weakness but otherwise non focal Note:  Portions of this note were generated with Lobbyist. Dictation errors may occur despite attempts at proofreading.  Teodoro Spray, NP

## 2020-03-17 ENCOUNTER — Ambulatory Visit: Payer: Medicare Other | Admitting: Neurology

## 2020-03-17 ENCOUNTER — Other Ambulatory Visit: Payer: Self-pay

## 2020-03-17 ENCOUNTER — Encounter: Payer: Self-pay | Admitting: Neurology

## 2020-03-17 VITALS — BP 104/68 | HR 77 | Ht 73.0 in | Wt 164.0 lb

## 2020-03-17 DIAGNOSIS — F0281 Dementia in other diseases classified elsewhere with behavioral disturbance: Secondary | ICD-10-CM | POA: Diagnosis not present

## 2020-03-17 DIAGNOSIS — G20 Parkinson's disease: Secondary | ICD-10-CM

## 2020-03-17 MED ORDER — QUETIAPINE FUMARATE 25 MG PO TABS: ORAL_TABLET | ORAL | 1 refills | Status: DC

## 2020-03-17 NOTE — Patient Instructions (Signed)
Take quetiapine, 25 mg, 1/2 in the AM and 1 at bed.  May add another 1/2 tablet in the late afternoon if needed for agitation

## 2020-03-23 DIAGNOSIS — G20 Parkinson's disease: Secondary | ICD-10-CM | POA: Diagnosis not present

## 2020-03-23 DIAGNOSIS — Z87891 Personal history of nicotine dependence: Secondary | ICD-10-CM | POA: Diagnosis not present

## 2020-03-23 DIAGNOSIS — E559 Vitamin D deficiency, unspecified: Secondary | ICD-10-CM | POA: Diagnosis not present

## 2020-03-23 DIAGNOSIS — G2581 Restless legs syndrome: Secondary | ICD-10-CM | POA: Diagnosis not present

## 2020-03-23 DIAGNOSIS — E611 Iron deficiency: Secondary | ICD-10-CM | POA: Diagnosis not present

## 2020-03-23 DIAGNOSIS — I6521 Occlusion and stenosis of right carotid artery: Secondary | ICD-10-CM | POA: Diagnosis not present

## 2020-03-23 DIAGNOSIS — E785 Hyperlipidemia, unspecified: Secondary | ICD-10-CM | POA: Diagnosis not present

## 2020-03-23 DIAGNOSIS — E1151 Type 2 diabetes mellitus with diabetic peripheral angiopathy without gangrene: Secondary | ICD-10-CM | POA: Diagnosis not present

## 2020-03-23 DIAGNOSIS — Z7984 Long term (current) use of oral hypoglycemic drugs: Secondary | ICD-10-CM | POA: Diagnosis not present

## 2020-03-23 DIAGNOSIS — I771 Stricture of artery: Secondary | ICD-10-CM | POA: Diagnosis not present

## 2020-03-23 DIAGNOSIS — R296 Repeated falls: Secondary | ICD-10-CM | POA: Diagnosis not present

## 2020-03-27 ENCOUNTER — Telehealth: Payer: Self-pay

## 2020-03-27 NOTE — Telephone Encounter (Signed)
Seroquel can interact with certain heart meds (that he is not taking) and can cause QT prolongation (which has not been an issue for him in the past). I do not see a cardiac contraindication for it's use, but should be carefully supervised by a Neurologist when used in Parkinson's disease.

## 2020-03-27 NOTE — Telephone Encounter (Signed)
Returning phone call.   Daughter states she is concerns if patients medication (Seroquel) could cause issues to his heart?   Advised I will forward to Dr. Royann Shivers to clarify.

## 2020-03-27 NOTE — Telephone Encounter (Signed)
The pt daughter [name] Corporate investment banker) was asking was the Parkinson medication doing something to the heart that she should know about? I told her per the nurse note that the patient wife states the medication makes him not feel well and that is why she should call the [facility] doctor to see if he can make adjustments to the medication. She still questioning if the medication is messing with his heart. I told her I will have the nurse call her back. Mary phone number is [phone]. She also states the patient wife was confused by the call and is very worried.

## 2020-03-27 NOTE — Progress Notes (Signed)
Carelink Summary Report / Loop Recorder 

## 2020-03-27 NOTE — Telephone Encounter (Signed)
[facility] alert received to adjust sensitivity on ILR. Called spoke to patients wife, states his medications he takes for Parkinson's sometimes makes him not feel well. Advised she call provider that prescribes to see if they can make adjustments.   ILR report appears to have noise noted. Per wife, patients has tremors, mainly on left side. Device is typically implanted in left chest area. Device is optimized in sensitivity. Will continue to monitor. Denies cardiac symptoms. Advised to call with questions or concerns. Verbalized understanding.

## 2020-03-28 NOTE — Telephone Encounter (Signed)
Called and spoke to wife to follow up with message from Dr. Royann Shivers. Thankful for call and update. Advised to call if we can assist in any further way.

## 2020-03-29 DIAGNOSIS — E559 Vitamin D deficiency, unspecified: Secondary | ICD-10-CM | POA: Diagnosis not present

## 2020-03-29 DIAGNOSIS — E785 Hyperlipidemia, unspecified: Secondary | ICD-10-CM | POA: Diagnosis not present

## 2020-03-29 DIAGNOSIS — I6521 Occlusion and stenosis of right carotid artery: Secondary | ICD-10-CM | POA: Diagnosis not present

## 2020-03-29 DIAGNOSIS — I771 Stricture of artery: Secondary | ICD-10-CM | POA: Diagnosis not present

## 2020-03-29 DIAGNOSIS — Z87891 Personal history of nicotine dependence: Secondary | ICD-10-CM | POA: Diagnosis not present

## 2020-03-29 DIAGNOSIS — G20 Parkinson's disease: Secondary | ICD-10-CM | POA: Diagnosis not present

## 2020-03-29 DIAGNOSIS — Z7984 Long term (current) use of oral hypoglycemic drugs: Secondary | ICD-10-CM | POA: Diagnosis not present

## 2020-03-29 DIAGNOSIS — E611 Iron deficiency: Secondary | ICD-10-CM | POA: Diagnosis not present

## 2020-03-29 DIAGNOSIS — R296 Repeated falls: Secondary | ICD-10-CM | POA: Diagnosis not present

## 2020-03-29 DIAGNOSIS — E1151 Type 2 diabetes mellitus with diabetic peripheral angiopathy without gangrene: Secondary | ICD-10-CM | POA: Diagnosis not present

## 2020-03-29 DIAGNOSIS — G2581 Restless legs syndrome: Secondary | ICD-10-CM | POA: Diagnosis not present

## 2020-04-06 DIAGNOSIS — R296 Repeated falls: Secondary | ICD-10-CM | POA: Diagnosis not present

## 2020-04-06 DIAGNOSIS — G2581 Restless legs syndrome: Secondary | ICD-10-CM | POA: Diagnosis not present

## 2020-04-06 DIAGNOSIS — G20 Parkinson's disease: Secondary | ICD-10-CM | POA: Diagnosis not present

## 2020-04-06 DIAGNOSIS — Z87891 Personal history of nicotine dependence: Secondary | ICD-10-CM | POA: Diagnosis not present

## 2020-04-06 DIAGNOSIS — I771 Stricture of artery: Secondary | ICD-10-CM | POA: Diagnosis not present

## 2020-04-06 DIAGNOSIS — E611 Iron deficiency: Secondary | ICD-10-CM | POA: Diagnosis not present

## 2020-04-06 DIAGNOSIS — E785 Hyperlipidemia, unspecified: Secondary | ICD-10-CM | POA: Diagnosis not present

## 2020-04-06 DIAGNOSIS — E559 Vitamin D deficiency, unspecified: Secondary | ICD-10-CM | POA: Diagnosis not present

## 2020-04-06 DIAGNOSIS — E1151 Type 2 diabetes mellitus with diabetic peripheral angiopathy without gangrene: Secondary | ICD-10-CM | POA: Diagnosis not present

## 2020-04-06 DIAGNOSIS — I6521 Occlusion and stenosis of right carotid artery: Secondary | ICD-10-CM | POA: Diagnosis not present

## 2020-04-06 DIAGNOSIS — Z7984 Long term (current) use of oral hypoglycemic drugs: Secondary | ICD-10-CM | POA: Diagnosis not present

## 2020-04-17 ENCOUNTER — Ambulatory Visit (INDEPENDENT_AMBULATORY_CARE_PROVIDER_SITE_OTHER): Payer: Medicare Other

## 2020-04-17 DIAGNOSIS — R55 Syncope and collapse: Secondary | ICD-10-CM

## 2020-04-17 LAB — CUP PACEART REMOTE DEVICE CHECK
Date Time Interrogation Session: 20220110215739
Implantable Pulse Generator Implant Date: 20210830

## 2020-04-20 DIAGNOSIS — E611 Iron deficiency: Secondary | ICD-10-CM | POA: Diagnosis not present

## 2020-04-20 DIAGNOSIS — E785 Hyperlipidemia, unspecified: Secondary | ICD-10-CM | POA: Diagnosis not present

## 2020-04-20 DIAGNOSIS — G20 Parkinson's disease: Secondary | ICD-10-CM | POA: Diagnosis not present

## 2020-04-20 DIAGNOSIS — E1151 Type 2 diabetes mellitus with diabetic peripheral angiopathy without gangrene: Secondary | ICD-10-CM | POA: Diagnosis not present

## 2020-04-20 DIAGNOSIS — I6521 Occlusion and stenosis of right carotid artery: Secondary | ICD-10-CM | POA: Diagnosis not present

## 2020-04-20 DIAGNOSIS — E559 Vitamin D deficiency, unspecified: Secondary | ICD-10-CM | POA: Diagnosis not present

## 2020-04-20 DIAGNOSIS — R296 Repeated falls: Secondary | ICD-10-CM | POA: Diagnosis not present

## 2020-04-20 DIAGNOSIS — G2581 Restless legs syndrome: Secondary | ICD-10-CM | POA: Diagnosis not present

## 2020-04-20 DIAGNOSIS — Z87891 Personal history of nicotine dependence: Secondary | ICD-10-CM | POA: Diagnosis not present

## 2020-04-20 DIAGNOSIS — Z7984 Long term (current) use of oral hypoglycemic drugs: Secondary | ICD-10-CM | POA: Diagnosis not present

## 2020-04-20 DIAGNOSIS — I771 Stricture of artery: Secondary | ICD-10-CM | POA: Diagnosis not present

## 2020-04-28 ENCOUNTER — Telehealth: Payer: Self-pay | Admitting: Family Medicine

## 2020-04-28 ENCOUNTER — Ambulatory Visit: Payer: Medicare Other | Admitting: Family Medicine

## 2020-04-28 ENCOUNTER — Emergency Department (HOSPITAL_COMMUNITY): Payer: Medicare Other

## 2020-04-28 ENCOUNTER — Other Ambulatory Visit: Payer: Self-pay

## 2020-04-28 ENCOUNTER — Inpatient Hospital Stay (HOSPITAL_COMMUNITY)
Admission: EM | Admit: 2020-04-28 | Discharge: 2020-05-06 | DRG: 521 | Disposition: A | Discharge status: Home Health | Payer: Medicare Other | Attending: Internal Medicine | Admitting: Internal Medicine

## 2020-04-28 DIAGNOSIS — Z23 Encounter for immunization: Secondary | ICD-10-CM

## 2020-04-28 DIAGNOSIS — S72001A Fracture of unspecified part of neck of right femur, initial encounter for closed fracture: Secondary | ICD-10-CM | POA: Diagnosis not present

## 2020-04-28 DIAGNOSIS — E1151 Type 2 diabetes mellitus with diabetic peripheral angiopathy without gangrene: Secondary | ICD-10-CM | POA: Diagnosis present

## 2020-04-28 DIAGNOSIS — F0280 Dementia in other diseases classified elsewhere without behavioral disturbance: Secondary | ICD-10-CM | POA: Diagnosis present

## 2020-04-28 DIAGNOSIS — Z7401 Bed confinement status: Secondary | ICD-10-CM | POA: Diagnosis not present

## 2020-04-28 DIAGNOSIS — S72011A Unspecified intracapsular fracture of right femur, initial encounter for closed fracture: Principal | ICD-10-CM | POA: Diagnosis present

## 2020-04-28 DIAGNOSIS — S72001S Fracture of unspecified part of neck of right femur, sequela: Secondary | ICD-10-CM | POA: Diagnosis not present

## 2020-04-28 DIAGNOSIS — W010XXA Fall on same level from slipping, tripping and stumbling without subsequent striking against object, initial encounter: Secondary | ICD-10-CM | POA: Diagnosis present

## 2020-04-28 DIAGNOSIS — Z833 Family history of diabetes mellitus: Secondary | ICD-10-CM

## 2020-04-28 DIAGNOSIS — M47812 Spondylosis without myelopathy or radiculopathy, cervical region: Secondary | ICD-10-CM | POA: Diagnosis not present

## 2020-04-28 DIAGNOSIS — Z7984 Long term (current) use of oral hypoglycemic drugs: Secondary | ICD-10-CM | POA: Diagnosis not present

## 2020-04-28 DIAGNOSIS — L89153 Pressure ulcer of sacral region, stage 3: Secondary | ICD-10-CM | POA: Diagnosis not present

## 2020-04-28 DIAGNOSIS — I6782 Cerebral ischemia: Secondary | ICD-10-CM | POA: Diagnosis not present

## 2020-04-28 DIAGNOSIS — E785 Hyperlipidemia, unspecified: Secondary | ICD-10-CM | POA: Diagnosis present

## 2020-04-28 DIAGNOSIS — M25551 Pain in right hip: Secondary | ICD-10-CM | POA: Diagnosis not present

## 2020-04-28 DIAGNOSIS — E119 Type 2 diabetes mellitus without complications: Secondary | ICD-10-CM

## 2020-04-28 DIAGNOSIS — Z823 Family history of stroke: Secondary | ICD-10-CM

## 2020-04-28 DIAGNOSIS — Y92009 Unspecified place in unspecified non-institutional (private) residence as the place of occurrence of the external cause: Secondary | ICD-10-CM | POA: Diagnosis not present

## 2020-04-28 DIAGNOSIS — E876 Hypokalemia: Secondary | ICD-10-CM | POA: Diagnosis not present

## 2020-04-28 DIAGNOSIS — I6529 Occlusion and stenosis of unspecified carotid artery: Secondary | ICD-10-CM | POA: Diagnosis not present

## 2020-04-28 DIAGNOSIS — S72041A Displaced fracture of base of neck of right femur, initial encounter for closed fracture: Secondary | ICD-10-CM | POA: Diagnosis not present

## 2020-04-28 DIAGNOSIS — G20 Parkinson's disease: Secondary | ICD-10-CM | POA: Diagnosis not present

## 2020-04-28 DIAGNOSIS — M2550 Pain in unspecified joint: Secondary | ICD-10-CM | POA: Diagnosis not present

## 2020-04-28 DIAGNOSIS — I10 Essential (primary) hypertension: Secondary | ICD-10-CM | POA: Diagnosis not present

## 2020-04-28 DIAGNOSIS — Z96641 Presence of right artificial hip joint: Secondary | ICD-10-CM | POA: Diagnosis not present

## 2020-04-28 DIAGNOSIS — Z01818 Encounter for other preprocedural examination: Secondary | ICD-10-CM | POA: Diagnosis not present

## 2020-04-28 DIAGNOSIS — Z9842 Cataract extraction status, left eye: Secondary | ICD-10-CM

## 2020-04-28 DIAGNOSIS — I739 Peripheral vascular disease, unspecified: Secondary | ICD-10-CM | POA: Diagnosis present

## 2020-04-28 DIAGNOSIS — Z743 Need for continuous supervision: Secondary | ICD-10-CM | POA: Diagnosis not present

## 2020-04-28 DIAGNOSIS — S728X1D Other fracture of right femur, subsequent encounter for closed fracture with routine healing: Secondary | ICD-10-CM | POA: Diagnosis not present

## 2020-04-28 DIAGNOSIS — J984 Other disorders of lung: Secondary | ICD-10-CM | POA: Diagnosis not present

## 2020-04-28 DIAGNOSIS — I2510 Atherosclerotic heart disease of native coronary artery without angina pectoris: Secondary | ICD-10-CM | POA: Diagnosis present

## 2020-04-28 DIAGNOSIS — S7291XA Unspecified fracture of right femur, initial encounter for closed fracture: Secondary | ICD-10-CM | POA: Diagnosis not present

## 2020-04-28 DIAGNOSIS — Z841 Family history of disorders of kidney and ureter: Secondary | ICD-10-CM | POA: Diagnosis not present

## 2020-04-28 DIAGNOSIS — Z87891 Personal history of nicotine dependence: Secondary | ICD-10-CM | POA: Diagnosis not present

## 2020-04-28 DIAGNOSIS — M79671 Pain in right foot: Secondary | ICD-10-CM | POA: Diagnosis not present

## 2020-04-28 DIAGNOSIS — E7800 Pure hypercholesterolemia, unspecified: Secondary | ICD-10-CM | POA: Diagnosis not present

## 2020-04-28 DIAGNOSIS — I6521 Occlusion and stenosis of right carotid artery: Secondary | ICD-10-CM

## 2020-04-28 DIAGNOSIS — Z7982 Long term (current) use of aspirin: Secondary | ICD-10-CM | POA: Diagnosis not present

## 2020-04-28 DIAGNOSIS — R52 Pain, unspecified: Secondary | ICD-10-CM | POA: Diagnosis not present

## 2020-04-28 DIAGNOSIS — S8991XA Unspecified injury of right lower leg, initial encounter: Secondary | ICD-10-CM | POA: Diagnosis not present

## 2020-04-28 DIAGNOSIS — I6523 Occlusion and stenosis of bilateral carotid arteries: Secondary | ICD-10-CM | POA: Diagnosis not present

## 2020-04-28 DIAGNOSIS — Z471 Aftercare following joint replacement surgery: Secondary | ICD-10-CM | POA: Diagnosis not present

## 2020-04-28 DIAGNOSIS — R9082 White matter disease, unspecified: Secondary | ICD-10-CM | POA: Diagnosis not present

## 2020-04-28 DIAGNOSIS — D696 Thrombocytopenia, unspecified: Secondary | ICD-10-CM | POA: Diagnosis not present

## 2020-04-28 DIAGNOSIS — Z20822 Contact with and (suspected) exposure to covid-19: Secondary | ICD-10-CM | POA: Diagnosis not present

## 2020-04-28 DIAGNOSIS — W19XXXA Unspecified fall, initial encounter: Secondary | ICD-10-CM | POA: Diagnosis not present

## 2020-04-28 DIAGNOSIS — S728X1A Other fracture of right femur, initial encounter for closed fracture: Secondary | ICD-10-CM | POA: Diagnosis not present

## 2020-04-28 DIAGNOSIS — Z79899 Other long term (current) drug therapy: Secondary | ICD-10-CM

## 2020-04-28 DIAGNOSIS — K5900 Constipation, unspecified: Secondary | ICD-10-CM | POA: Diagnosis not present

## 2020-04-28 DIAGNOSIS — L8990 Pressure ulcer of unspecified site, unspecified stage: Secondary | ICD-10-CM | POA: Insufficient documentation

## 2020-04-28 DIAGNOSIS — S79929A Unspecified injury of unspecified thigh, initial encounter: Secondary | ICD-10-CM | POA: Diagnosis not present

## 2020-04-28 DIAGNOSIS — J439 Emphysema, unspecified: Secondary | ICD-10-CM | POA: Diagnosis not present

## 2020-04-28 DIAGNOSIS — S0990XA Unspecified injury of head, initial encounter: Secondary | ICD-10-CM | POA: Diagnosis not present

## 2020-04-28 DIAGNOSIS — R350 Frequency of micturition: Secondary | ICD-10-CM | POA: Diagnosis not present

## 2020-04-28 DIAGNOSIS — S199XXA Unspecified injury of neck, initial encounter: Secondary | ICD-10-CM | POA: Diagnosis not present

## 2020-04-28 DIAGNOSIS — R296 Repeated falls: Secondary | ICD-10-CM | POA: Diagnosis present

## 2020-04-28 LAB — URINALYSIS, ROUTINE W REFLEX MICROSCOPIC
Bilirubin Urine: NEGATIVE
Glucose, UA: NEGATIVE mg/dL
Hgb urine dipstick: NEGATIVE
Ketones, ur: NEGATIVE mg/dL
Leukocytes,Ua: NEGATIVE
Nitrite: NEGATIVE
Protein, ur: NEGATIVE mg/dL
Specific Gravity, Urine: 1.015 (ref 1.005–1.030)
pH: 7 (ref 5.0–8.0)

## 2020-04-28 LAB — CBC WITH DIFFERENTIAL/PLATELET
Abs Immature Granulocytes: 0.03 10*3/uL (ref 0.00–0.07)
Basophils Absolute: 0 10*3/uL (ref 0.0–0.1)
Basophils Relative: 0 %
Eosinophils Absolute: 0.3 10*3/uL (ref 0.0–0.5)
Eosinophils Relative: 4 %
HCT: 43.6 % (ref 39.0–52.0)
Hemoglobin: 14.3 g/dL (ref 13.0–17.0)
Immature Granulocytes: 0 %
Lymphocytes Relative: 10 %
Lymphs Abs: 0.8 10*3/uL (ref 0.7–4.0)
MCH: 31.9 pg (ref 26.0–34.0)
MCHC: 32.8 g/dL (ref 30.0–36.0)
MCV: 97.3 fL (ref 80.0–100.0)
Monocytes Absolute: 0.8 10*3/uL (ref 0.1–1.0)
Monocytes Relative: 10 %
Neutro Abs: 6.1 10*3/uL (ref 1.7–7.7)
Neutrophils Relative %: 76 %
Platelets: 162 10*3/uL (ref 150–400)
RBC: 4.48 MIL/uL (ref 4.22–5.81)
RDW: 13.2 % (ref 11.5–15.5)
WBC: 8.1 10*3/uL (ref 4.0–10.5)
nRBC: 0 % (ref 0.0–0.2)

## 2020-04-28 LAB — COMPREHENSIVE METABOLIC PANEL
ALT: 12 U/L (ref 0–44)
AST: 16 U/L (ref 15–41)
Albumin: 3.8 g/dL (ref 3.5–5.0)
Alkaline Phosphatase: 84 U/L (ref 38–126)
Anion gap: 9 (ref 5–15)
BUN: 11 mg/dL (ref 8–23)
CO2: 26 mmol/L (ref 22–32)
Calcium: 9 mg/dL (ref 8.9–10.3)
Chloride: 104 mmol/L (ref 98–111)
Creatinine, Ser: 0.84 mg/dL (ref 0.61–1.24)
GFR, Estimated: >60 mL/min (ref >60)
Glucose, Bld: 150 mg/dL — ABNORMAL HIGH (ref 70–99)
Potassium: 3.2 mmol/L — ABNORMAL LOW (ref 3.5–5.1)
Sodium: 139 mmol/L (ref 135–145)
Total Bilirubin: 1.8 mg/dL — ABNORMAL HIGH (ref 0.3–1.2)
Total Protein: 7.3 g/dL (ref 6.5–8.1)

## 2020-04-28 LAB — SARS CORONAVIRUS 2 BY RT PCR (HOSPITAL ORDER, PERFORMED IN ~~LOC~~ HOSPITAL LAB): SARS Coronavirus 2: NEGATIVE

## 2020-04-28 LAB — POC OCCULT BLOOD, ED: Fecal Occult Bld: NEGATIVE

## 2020-04-28 MED ORDER — INFLUENZA VAC A&B SA ADJ QUAD 0.5 ML IM PRSY
0.5000 mL | PREFILLED_SYRINGE | INTRAMUSCULAR | Status: DC
  Filled 2020-04-28: qty 0.5

## 2020-04-28 MED ORDER — POTASSIUM CHLORIDE IN NACL 20-0.9 MEQ/L-% IV SOLN
INTRAVENOUS | Status: DC
  Filled 2020-04-28 (×10): qty 1000

## 2020-04-28 MED ORDER — POVIDONE-IODINE 10 % EX SWAB: 2.0000 "application " | Freq: Once | CUTANEOUS | Status: DC

## 2020-04-28 MED ORDER — CEFAZOLIN SODIUM-DEXTROSE 2-4 GM/100ML-% IV SOLN: 2.0000 g | INTRAVENOUS | Status: DC

## 2020-04-28 MED ORDER — ACETAMINOPHEN 650 MG RE SUPP: 650.0000 mg | Freq: Four times a day (QID) | RECTAL | Status: DC | PRN

## 2020-04-28 MED ORDER — ONDANSETRON HCL 4 MG PO TABS: 4.0000 mg | ORAL_TABLET | Freq: Four times a day (QID) | ORAL | Status: DC | PRN

## 2020-04-28 MED ORDER — CHLORHEXIDINE GLUCONATE 4 % EX LIQD: 60.0000 mL | Freq: Once | CUTANEOUS | Status: DC

## 2020-04-28 MED ORDER — ACETAMINOPHEN 325 MG PO TABS: 650.0000 mg | ORAL_TABLET | Freq: Four times a day (QID) | ORAL | Status: DC | PRN

## 2020-04-28 MED ORDER — ENSURE PRE-SURGERY PO LIQD
296.0000 mL | Freq: Once | ORAL | Status: AC
  Administered 2020-04-29: 296 mL via ORAL
  Filled 2020-04-28: qty 296

## 2020-04-28 MED ORDER — MORPHINE SULFATE (PF) 2 MG/ML IV SOLN: 2.0000 mg | INTRAVENOUS | Status: DC | PRN

## 2020-04-28 MED ORDER — TRANEXAMIC ACID-NACL 1000-0.7 MG/100ML-% IV SOLN
1000.0000 mg | INTRAVENOUS | Status: AC
  Administered 2020-04-29: 1000 mg via INTRAVENOUS

## 2020-04-28 MED ORDER — ONDANSETRON HCL 4 MG/2ML IJ SOLN: 4.0000 mg | Freq: Four times a day (QID) | INTRAMUSCULAR | Status: DC | PRN

## 2020-04-28 NOTE — Progress Notes (Signed)
Consult received for subacute right femoral neck fracture after GLF. H/o Parkinson's dz, dementia, diabetes, and hypercholesterolemia. Lives at home with family. EDP has consulted [facility] for admission. Plan for R THA tomorrow am. NPO after MN. Hold chemical DVT ppx. Full consult to follow.

## 2020-04-28 NOTE — Telephone Encounter (Addendum)
Patient's daughter Rodney Love called advised patient is going to [place] said it was to painful to move him trying to get him here for his appointment. Appointment was canceled  The number to contact [name] is [phone]

## 2020-04-28 NOTE — ED Provider Notes (Signed)
[facility] DEPT Provider Note   CSN: [id number] Arrival date & time: 04/28/20  1453     History Chief Complaint  Patient presents with  . Fall    Rodney Love is a 75 y.o. male.  HPI   Due to level 5 caveat HPI will be for due to dementia   Patient with significant medical history of Parkinson's, diabetes presents to the emergency department via EMS due to right hip pain.  Patient is a poor historian and could not obtain adequate HPI.  He endorses that he fell, and is complaining of right hip pain and right foot pain.  He denies hitting his head, losing conscious, and has no other complaints at this time.    Remainder of HPI was collected from patient's daughter Ms. [name] cassell who endorses that patient had a mechanical like fall on on Wednesday, patient was getting up from the table turned lost his balance and fell onto his right side.  It was a witnessed fall, they denied him hitting his head or losing conscious, he endorsed some right hip pain and some right foot pain, he was able to ambulate on it for the first day or 2 but today he was unable to bear any weight on it.  He was supposed to go to his primary care provider but they were unable to get him into the car and he came here for further evaluation.  They endorse that the patient has become more confused and has had had more frequent urination.  Patient can generally  walk around at home with a walker.  Patient lives with his wife who is there to help him.  Past Medical History:  Diagnosis Date  . Arthritis   . Constipation    OCCASIONAL  . Coronary artery disease   . Diabetes mellitus without complication ([facility])   . Environmental allergies   . Hyperlipidemia   . Hypertension   . Inguinal hernia   . Iron deficiency   . Nocturia   . Parkinson disease ([facility])   . Peripheral arterial disease [facility])     Patient Active Problem List   Diagnosis Date Noted  . Femur fracture, right ([facility])  04/28/2020  . PD (Parkinson's disease) ([facility]) 12/18/2015  . Tremor of both hands 11/22/2015  . PAD (peripheral artery disease) s/p remote left external iliac artery angioplasty 10/28/2012  . Coronary atherosclerosis 10/28/2012  . Hypercholesterolemia 10/28/2012  . Essential hypertension 10/28/2012  . Diabetes mellitus type 2 in nonobese ([facility]) 10/28/2012  . Carotid occlusion, right 10/28/2012    Past Surgical History:  Procedure Laterality Date  . BACK SURGERY    . CARDIAC CATHETERIZATION  05/29/2006   single vessel CAD w/moderate stenosis of the prox. RCA approx 40%,prox. LAD 20-30%,minor irreg. mid abdominal aorta,left iliac artery disease 50-60%  . CATARACT EXTRACTION Left 12/2016  . HERNIA REPAIR  [id number]  . INGUINAL HERNIA REPAIR N/A 09/23/2012   Procedure: LAPAROSCOPIC INGUINAL HERNIA with umbilical hernia;  Surgeon: Madilyn Hook, DO;  Location: [facility] ORS;  Service: General;  Laterality: N/A;  . INSERTION OF MESH Bilateral 09/23/2012   Procedure: INSERTION OF MESH;  Surgeon: Madilyn Hook, DO;  Location: [facility] ORS;  Service: General;  Laterality: Bilateral;       Family History  Problem Relation Age of Onset  . Diabetes Mother   . Kidney disease Mother   . Stroke Father   . Stroke Brother   . Cancer Sister   . Cancer Sister   .  Diabetes Sister   . Healthy Daughter   . Healthy Daughter   . Healthy Son     Social History   Tobacco Use  . Smoking status: Former Smoker    Quit date: 04/02/1991    Years since quitting: 29.0  . Smokeless tobacco: Former Systems developer    Quit date: 08/06/1991  Vaping Use  . Vaping Use: Never used  Substance Use Topics  . Alcohol use: No  . Drug use: No    Home Medications Prior to Admission medications   Medication Sig Start Date End Date Taking? Authorizing Provider  Ascorbic Acid (VITAMIN C) 100 MG tablet Take 100 mg by mouth in the morning and at bedtime.     [provider]  aspirin EC 81 MG tablet Take 81 mg by mouth daily.     [provider]  atorvastatin (LIPITOR) 20 MG tablet Take 20 mg by mouth daily.  01/27/13   [provider]  carbidopa-levodopa (SINEMET CR) 50-200 MG tablet TAKE 1 TABLET BY MOUTH AT BEDTIME 02/10/20   Tat, Rebecca S, DO  Carbidopa-Levodopa ER (SINEMET CR) 25-100 MG tablet controlled release TAKE 1 TABLET BY MOUTH EVERY MORNING, AT NOON, IN THE EVENING. AND AT BEDTIME 02/21/20   Tat, Eustace Quail, DO  Cyanocobalamin (VITAMIN B 12 PO) Take 1 tablet by mouth daily.    [provider]  ferrous sulfate 324 MG TBEC Take 324 mg by mouth in the morning and at bedtime.     [provider]  metFORMIN (GLUCOPHAGE) 850 MG tablet Take 850 mg by mouth 3 (three) times a week. Mon/Wed/Fri    [provider]  [facility] ULTRA test strip  02/04/19   [provider]  QUEtiapine (SEROQUEL) 25 MG tablet 1/2 in the AM and 1 at bed.  May add another 1/2 tablet in the late afternoon if need 03/17/20   Tat, Eustace Quail, DO  tamsulosin (FLOMAX) 0.4 MG CAPS capsule Take 1 capsule by mouth every evening.  01/27/13   [provider]  pramipexole (MIRAPEX) 0.125 MG tablet Take 0.125 mg by mouth at bedtime.  10/15/19  [provider]    Allergies    Patient has no known allergies.  Review of Systems   Review of Systems  Physical Exam Updated Vital Signs BP (!) 145/91   Pulse 64   Temp 98.1 F (36.7 C) (Oral)   Resp 18   Ht 6\' 1"  (1.854 m)   Wt 74.4 kg   SpO2 100%   BMI 21.64 kg/m   Physical Exam Vitals and nursing note reviewed.  Constitutional:      General: He is not in acute distress.    Appearance: He is not ill-appearing.     Comments: Patient was found to be in a deconditioned state.  HENT:     Head: Normocephalic and atraumatic.     Nose: No congestion.     Mouth/Throat:     Mouth: Mucous membranes are moist.  Eyes:     Extraocular Movements: Extraocular movements intact.     Conjunctiva/sclera: Conjunctivae normal.   Cardiovascular:     Rate and Rhythm: Normal rate and regular rhythm.     Pulses: Normal pulses.     Heart sounds: No murmur heard. No friction rub. No gallop.   Pulmonary:     Effort: No respiratory distress.     Breath sounds: No wheezing, rhonchi or rales.  Abdominal:     Palpations: Abdomen is soft.  Tenderness: There is no abdominal tenderness.  Musculoskeletal:     Cervical back: No rigidity.     Comments: Patient's legs were visualized his right leg was externally rotated and slightly shortened, he had no other gross abnormalities noted.  He had full range of motion in his right toes, ankle, knee but was unable to flex at the hip due to pain.  He was tender to palpation along the femoral head, there is no deformities or crepitus is present.  Neurovascular fully intact in lower extremities.  Patient is moving other 3 extremities out difficulty.  Spine was palpated nontender to palpation no step-off or deformity is present  Skin:    General: Skin is warm and dry.  Neurological:     Mental Status: He is alert. Mental status is at baseline.  Psychiatric:        Mood and Affect: Mood normal.     ED Results / Procedures / Treatments   Labs (all labs ordered are listed, but only abnormal results are displayed) Labs Reviewed  COMPREHENSIVE METABOLIC PANEL - Abnormal; Notable for the following components:      Result Value   Potassium 3.2 (*)    Glucose, Bld 150 (*)    Total Bilirubin 1.8 (*)    All other components within normal limits  SARS CORONAVIRUS 2 BY RT PCR (HOSPITAL ORDER, [facility])  CBC WITH DIFFERENTIAL/PLATELET  URINALYSIS, ROUTINE W REFLEX MICROSCOPIC  POC OCCULT BLOOD, ED    EKG None  Radiology DG Chest 1 View  Result Date: 04/28/2020 CLINICAL DATA:  Preop EXAM: CHEST  1 VIEW COMPARISON:  09/03/2019 FINDINGS: Loop recorder device projects over the left chest. Heart and mediastinal contours are within normal limits. No  focal opacities or effusions. No acute bony abnormality. IMPRESSION: No active disease. Electronically Signed   By: Rolm Baptise M.D.   On: 04/28/2020 18:21   CT Head Wo Contrast  Result Date: 04/28/2020 CLINICAL DATA:  Mechanical fall 3 days ago, head trauma. EXAM: CT HEAD WITHOUT CONTRAST TECHNIQUE: Contiguous axial images were obtained from the base of the skull through the vertex without intravenous contrast. COMPARISON:  None. FINDINGS: Brain: The brainstem, cerebellum, cerebral peduncles, thalami, basal ganglia, basilar cisterns, and ventricular system appear within normal limits. Periventricular white matter and corona radiata hypodensities favor chronic ischemic microvascular white matter disease. No intracranial hemorrhage, mass lesion, or acute CVA. Vascular: There is atherosclerotic calcification of the cavernous carotid arteries bilaterally. Skull: Unremarkable Sinuses/Orbits: Chronic bilateral maxillary sinusitis with a superimposed air-level in the left maxillary sinus compatible with acute left maxillary sinusitis. Other: No supplemental non-categorized findings. IMPRESSION: 1. No acute intracranial findings. 2. Periventricular white matter and corona radiata hypodensities favor chronic ischemic microvascular white matter disease. 3. Chronic bilateral maxillary sinusitis with a superimposed air-level in the left maxillary sinus compatible with acute left maxillary sinusitis. Electronically Signed   By: Van Clines M.D.   On: 04/28/2020 16:21   CT Cervical Spine Wo Contrast  Result Date: 04/28/2020 CLINICAL DATA:  Fall 3 days ago, neck trauma. EXAM: CT CERVICAL SPINE WITHOUT CONTRAST TECHNIQUE: Multidetector CT imaging of the cervical spine was performed without intravenous contrast. Multiplanar CT image reconstructions were also generated. COMPARISON:  CT cervical spine 11/11/2019 FINDINGS: Alignment: Mild reversal of the normal cervical lordosis. No subluxation identified. Skull base  and vertebrae: Spurring at the anterior C1-2 articulation. No cervical spine fracture or acute subluxation. No acute bony findings are observed. Soft tissues and spinal canal:  Bilateral common carotid atherosclerotic calcification. Disc levels: Multilevel uncinate and facet spurring potentially with mild right foraminal impingement at C3-4. Loss of intervertebral disc height at multiple levels, most strikingly at C5-6 and C6-7. Upper chest: Biapical pleuroparenchymal scarring. Suspected emphysema. Other: No supplemental non-categorized findings. IMPRESSION: 1. No acute cervical spine findings. 2. Cervical spondylosis and degenerative disc disease. 3. Bilateral common carotid atherosclerotic calcification. 4. Suspected emphysema. Emphysema (ICD10-J43.9). Electronically Signed   By: Van Clines M.D.   On: 04/28/2020 16:26   DG Knee Complete 4 Views Right  Result Date: 04/28/2020 CLINICAL DATA:  Fall, right hip fracture EXAM: RIGHT KNEE - COMPLETE 4+ VIEW COMPARISON:  None. FINDINGS: No evidence of fracture, dislocation, or joint effusion. No evidence of arthropathy or other focal bone abnormality. Soft tissues are unremarkable. IMPRESSION: Negative. Electronically Signed   By: Rolm Baptise M.D.   On: 04/28/2020 18:21   DG Foot Complete Right  Result Date: 04/28/2020 CLINICAL DATA:  Right foot pain.  Fall 3 days ago. EXAM: RIGHT FOOT COMPLETE - 3+ VIEW COMPARISON:  None. FINDINGS: No convincing fracture.  No bone lesion. Joints are normally aligned. Small dorsal and plantar calcaneal spurs. Soft tissues are unremarkable. IMPRESSION: No fracture or dislocation. Electronically Signed   By: Lajean Manes M.D.   On: 04/28/2020 16:02   DG Hip Unilat With Pelvis 2-3 Views Right  Result Date: 04/28/2020 CLINICAL DATA:  Right hip pain after fall 3 days ago. EXAM: DG HIP (WITH OR WITHOUT PELVIS) 2-3V RIGHT COMPARISON:  None. FINDINGS: Displaced right femoral neck fracture with proximal migration of the  femoral shaft. No significant angulation. The femoral head remains seated. Mild acetabular spurring. Pubic rami are intact. Bones are diffusely under mineralized. Soft tissue edema is noted laterally. There are vascular calcifications. IMPRESSION: Displaced right femoral neck fracture. Electronically Signed   By: Keith Rake M.D.   On: 04/28/2020 16:03    Procedures Procedures   Medications Ordered in ED Medications - No data to display  ED Course  I have reviewed the triage vital signs and the nursing notes.  Pertinent labs & imaging results that were available during my care of the patient were reviewed by me and considered in my medical decision making (see chart for details).    MDM Rules/Calculators/A&P                          Patient presents after a mechanical fall.  He was alert, does not appear acute distress, vital signs reassuring.  Due to concerns of possible hip fracture will obtain imaging of hip, and obtain imaging of head neck as he is a poor historian.  Basic labs will be obtained as well.  Patient is reassessed, continues to have no complaints at this time, vital signs reassuring.  Due to displaced right femoral neck fracture will consult with orthopedic surgery for further recommendations and evaluations.  Spoke with Dr. Lyla Glassing of orthopedic surgery team recommends making the patient n.p.o. after midnight, obtain additional x-ray of knee, admit to medicine and he will take patient to the OR tomorrow morning. Spoke with Dr. Jonelle Sidle who has accepted the patient will come down evaluate him.  Pelvic shows displaced right femoral neck fracture.  Right foot does not reveal any acute findings. Chest x-ray does not reveal any acute findings. Knee x-ray does not reveal any acute findings. CT head neck does not reveal any acute findings. CBC negative for leukocytosis and anemia. CMP shows slight  hypokalemia 3.2, slight hyperglycemia 150, no anion gap, Covid negative.  I have  low suspicion for ACS as patient denies chest pain, shortness of breath, no signs of hypoperfusion or fluid overload noted on exam, EKG sinus without signs of ischemia. Low suspicion for CVA or intracranial head bleed as there is no noted neuro deficits on my exam, head CT was negative for acute findings. Low suspicion for systemic infection as patient is nontoxic-appearing, vital signs reassuring, no obvious infection on my exam. I suspect patient suffered from a mechanical fall resulting in a right femoral neck fracture. Anticipate patient will need hospital admission, surgical correction and further follow-up.  Patient care will be transferred to admitting team.  Final Clinical Impression(s) / ED Diagnoses Final diagnoses:  Closed fracture of neck of right femur, initial encounter [facility])    Rx / DC Orders ED Discharge Orders    None       Marcello Fennel, PA-C 04/28/20 1827    Carmin Muskrat, MD 04/29/20 1747

## 2020-04-28 NOTE — Telephone Encounter (Signed)
I called and left voice mail, letting the daughter know Dr. Junius Roads has been advised. If the patient is in that much pain post fall, then the ED is the best place for him to go for evaluation and treatment.

## 2020-04-28 NOTE — H&P (Signed)
History and Physical   Rodney Love:[id number] DOB: Sep 10, 1945 DOA: 04/28/2020  Referring MD/NP/PA: Dr. Langston Masker  PCP: Lujean Amel, MD   Outpatient Specialists: None  Patient coming from: Home  Chief Complaint: Fall with right hip pain  HPI: Rodney Love is a 75 y.o. male with medical history significant of Parkinson's disease, diabetes, coronary artery disease, hypertension and hyperlipidemia who apparently sustained a mechanical fall at home.  Patient is here with his wife who reported the fall about 5 days ago.  Since then he has been having right-sided hip pain and right foot pain.  Did not lose consciousness.  Did not have any other area of pain or injury.  Patient came to the ER where he was evaluated.  He was found to have right femoral neck fracture.  Surgery consulted and patient is being admitted to the hospital with plan for surgery in the morning.  He is hemodynamically stable otherwise.  Patient is having 10 out of 10 pain on arrival but currently down to 4 out of 10.  Patient will be admitted to the medical service for further evaluation and treatment..  ED Course: Temperature 98.1 blood pressure 132/72 pulse 148 respirate of 18 oxygen sat 95% on room air.  White count 8.1 hemoglobin 14.3 platelet 162.  Potassium is 3.2 otherwise chemistry appears to be within normal.  Chest x-ray shows no acute findings x-ray of the knee showed no significant findings.  X-ray of the hip shows right femoral neck fracture.  Orthopedics consulted and patient being admitted for further evaluation and treatment.  Review of Systems: As per HPI otherwise 10 point review of systems negative.    Past Medical History:  Diagnosis Date  . Arthritis   . Constipation    OCCASIONAL  . Coronary artery disease   . Diabetes mellitus without complication ([place])   . Environmental allergies   . Hyperlipidemia   . Hypertension   . Inguinal hernia   . Iron deficiency   . Nocturia   . Parkinson  disease ([place])   . Peripheral arterial disease [facility])     Past Surgical History:  Procedure Laterality Date  . BACK SURGERY    . CARDIAC CATHETERIZATION  05/29/2006   single vessel CAD w/moderate stenosis of the prox. RCA approx 40%,prox. LAD 20-30%,minor irreg. mid abdominal aorta,left iliac artery disease 50-60%  . CATARACT EXTRACTION Left 12/2016  . HERNIA REPAIR  [id number]  . INGUINAL HERNIA REPAIR N/A 09/23/2012   Procedure: LAPAROSCOPIC INGUINAL HERNIA with umbilical hernia;  Surgeon: Madilyn Hook, DO;  Location: [facility] ORS;  Service: General;  Laterality: N/A;  . INSERTION OF MESH Bilateral 09/23/2012   Procedure: INSERTION OF MESH;  Surgeon: Madilyn Hook, DO;  Location: [facility] OR[facility];  Service: General;  Laterality: Bilateral;     reports that he quit smoking about 29 years ago. He quit smokeless tobacco use about 28 years ago. He reports that he does not drink alcohol and does not use drugs.  No Known Allergies  Family History  Problem Relation Age of Onset  . Diabetes Mother   . Kidney disease Mother   . Stroke Father   . Stroke Brother   . Cancer Sister   . Cancer Sister   . Diabetes Sister   . Healthy Daughter   . Healthy Daughter   . Healthy Son      Prior to Admission medications   Medication Sig Start Date End Date Taking? Authorizing Provider  Ascorbic Acid (VITAMIN C) 100 MG  tablet Take 100 mg by mouth in the morning and at bedtime.     [provider]  aspirin EC 81 MG tablet Take 81 mg by mouth daily.    [provider]  atorvastatin (LIPITOR) 20 MG tablet Take 20 mg by mouth daily.  01/27/13   [provider]  carbidopa-levodopa (SINEMET CR) 50-200 MG tablet TAKE 1 TABLET BY MOUTH AT BEDTIME 02/10/20   Tat, Rebecca S, DO  Carbidopa-Levodopa ER (SINEMET CR) 25-100 MG tablet controlled release TAKE 1 TABLET BY MOUTH EVERY MORNING, AT NOON, IN THE EVENING. AND AT BEDTIME 02/21/20   Tat, Eustace Quail, DO  Cyanocobalamin (VITAMIN B 12 PO) Take 1 tablet  by mouth daily.    [provider]  ferrous sulfate 324 MG TBEC Take 324 mg by mouth in the morning and at bedtime.     [provider]  metFORMIN (GLUCOPHAGE) 850 MG tablet Take 850 mg by mouth 3 (three) times a week. Mon/Wed/Fri    [provider]  [facility] ULTRA test strip  02/04/19   [provider]  QUEtiapine (SEROQUEL) 25 MG tablet 1/2 in the AM and 1 at bed.  May add another 1/2 tablet in the late afternoon if need 03/17/20   Tat, Eustace Quail, DO  tamsulosin (FLOMAX) 0.4 MG CAPS capsule Take 1 capsule by mouth every evening.  01/27/13   [provider]  pramipexole (MIRAPEX) 0.125 MG tablet Take 0.125 mg by mouth at bedtime.  10/15/19  [provider]    Physical Exam: Vitals:   04/28/20 1512 04/28/20 1513 04/28/20 1638 04/28/20 1745  BP: (!) 145/72  (!) 152/72 (!) 145/91  Pulse: 69  70 64  Resp: 16  18 18   Temp: 98.1 F (36.7 C)     TempSrc: Oral     SpO2: 99%  98% 100%  Weight:  74.4 kg    Height:  6\' 1"  (1.854 m)        Constitutional: Acutely ill looking no distress  Vitals:   04/28/20 1512 04/28/20 1513 04/28/20 1638 04/28/20 1745  BP: (!) 145/72  (!) 152/72 (!) 145/91  Pulse: 69  70 64  Resp: 16  18 18   Temp: 98.1 F (36.7 C)     TempSrc: Oral     SpO2: 99%  98% 100%  Weight:  74.4 kg    Height:  6\' 1"  (1.854 m)     Eyes: PERRL, lids and conjunctivae normal ENMT: Mucous membranes are moist. Posterior pharynx clear of any exudate or lesions.Normal dentition.  Neck: normal, supple, no masses, no thyromegaly Respiratory: clear to auscultation bilaterally, no wheezing, no crackles. Normal respiratory effort. No accessory muscle use.  Cardiovascular: Regular rate and rhythm, no murmurs / rubs / gallops. No extremity edema. 2+ pedal pulses. No carotid bruits.  Abdomen: no tenderness, no masses palpated. No hepatosplenomegaly. Bowel sounds positive.  Musculoskeletal: no clubbing / cyanosis.  Shortening and laterally  rotated right hip area, no contractures. Normal muscle tone.  Skin: no rashes, lesions, ulcers. No induration Neurologic: CN 2-12 grossly intact. Sensation intact, DTR normal. Strength 5/5 in all 4.  Psychiatric: Slowing with mild confusion    Labs on Admission: I have personally reviewed following labs and imaging studies  CBC: Recent Labs  Lab 04/28/20 1607  WBC 8.1  NEUTROABS 6.1  HGB 14.3  HCT 43.6  MCV 97.3  PLT 0000000   Basic Metabolic Panel: Recent Labs  Lab 04/28/20 1607  NA 139  K 3.2*  CL 104  CO2 26  GLUCOSE 150*  BUN 11  CREATININE 0.84  CALCIUM 9.0   GFR: Estimated Creatinine Clearance: 78.7 mL/min (by C-G formula based on SCr of 0.84 mg/dL). Liver Function Tests: Recent Labs  Lab 04/28/20 1607  AST 16  ALT 12  ALKPHOS 84  BILITOT 1.8*  PROT 7.3  ALBUMIN 3.8   No results for input(s): LIPASE, AMYLASE in the last 168 hours. No results for input(s): AMMONIA in the last 168 hours. Coagulation Profile: No results for input(s): INR, PROTIME in the last 168 hours. Cardiac Enzymes: No results for input(s): CKTOTAL, CKMB, CKMBINDEX, TROPONINI in the last 168 hours. BNP (last 3 results) No results for input(s): PROBNP in the last 8760 hours. HbA1C: No results for input(s): HGBA1C in the last 72 hours. CBG: No results for input(s): GLUCAP in the last 168 hours. Lipid Profile: No results for input(s): CHOL, HDL, LDLCALC, TRIG, CHOLHDL, LDLDIRECT in the last 72 hours. Thyroid Function Tests: No results for input(s): TSH, T4TOTAL, FREET4, T3FREE, THYROIDAB in the last 72 hours. Anemia Panel: No results for input(s): VITAMINB12, FOLATE, FERRITIN, TIBC, IRON, RETICCTPCT in the last 72 hours. Urine analysis:    Component Value Date/Time   COLORURINE YELLOW 11/15/2019 2000   APPEARANCEUR CLEAR 11/15/2019 2000   LABSPEC 1.018 11/15/2019 2000   PHURINE 6.0 11/15/2019 2000   GLUCOSEU 50 (A) 11/15/2019 2000   HGBUR NEGATIVE 11/15/2019 2000   BILIRUBINUR  NEGATIVE 11/15/2019 2000   KETONESUR NEGATIVE 11/15/2019 2000   PROTEINUR NEGATIVE 11/15/2019 2000   NITRITE NEGATIVE 11/15/2019 2000   LEUKOCYTESUR TRACE (A) 11/15/2019 2000   Sepsis Labs: @LABRCNTIP (procalcitonin:4,lacticidven:4) ) Recent Results (from the past 240 hour(s))  SARS Coronavirus 2 by RT PCR (hospital order, performed in [facility] hospital lab) Nasopharyngeal Nasopharyngeal Swab     Status: None   Collection Time: 04/28/20  4:07 PM   Specimen: Nasopharyngeal Swab  Result Value Ref Range Status   SARS Coronavirus 2 NEGATIVE NEGATIVE Final    Comment: (NOTE) SARS-CoV-2 target nucleic acids are NOT DETECTED.  The SARS-CoV-2 RNA is generally detectable in upper and lower respiratory specimens during the acute phase of infection. The lowest concentration of SARS-CoV-2 viral copies this assay can detect is 250 copies / mL. A negative result does not preclude SARS-CoV-2 infection and should not be used as the sole basis for treatment or other patient management decisions.  A negative result may occur with improper specimen collection / handling, submission of specimen other than nasopharyngeal swab, presence of viral mutation(s) within the areas targeted by this assay, and inadequate number of viral copies (<250 copies / mL). A negative result must be combined with clinical observations, patient history, and epidemiological information.  Fact Sheet for Patients:   StrictlyIdeas.no  Fact Sheet for Healthcare Providers: BankingDealers.co.za  This test is not yet approved or  cleared by the [place] FDA and has been authorized for detection and/or diagnosis of SARS-CoV-2 by FDA under an Emergency Use Authorization (EUA).  This EUA will remain in effect (meaning this test can be used) for the duration of the COVID-19 declaration under Section 564(b)(1) of the Act, 21 U.S.C. section 360bbb-3(b)(1), unless the  authorization is terminated or revoked sooner.  Performed at [facility], [place] [address] [place], [place] [address]      Radiological Exams on Admission: CT Head Wo Contrast  Result Date: 04/28/2020 CLINICAL DATA:  Mechanical fall 3 days ago, head trauma. EXAM: CT HEAD WITHOUT CONTRAST TECHNIQUE: Contiguous  axial images were obtained from the base of the skull through the vertex without intravenous contrast. COMPARISON:  None. FINDINGS: Brain: The brainstem, cerebellum, cerebral peduncles, thalami, basal ganglia, basilar cisterns, and ventricular system appear within normal limits. Periventricular white matter and corona radiata hypodensities favor chronic ischemic microvascular white matter disease. No intracranial hemorrhage, mass lesion, or acute CVA. Vascular: There is atherosclerotic calcification of the cavernous carotid arteries bilaterally. Skull: Unremarkable Sinuses/Orbits: Chronic bilateral maxillary sinusitis with a superimposed air-level in the left maxillary sinus compatible with acute left maxillary sinusitis. Other: No supplemental non-categorized findings. IMPRESSION: 1. No acute intracranial findings. 2. Periventricular white matter and corona radiata hypodensities favor chronic ischemic microvascular white matter disease. 3. Chronic bilateral maxillary sinusitis with a superimposed air-level in the left maxillary sinus compatible with acute left maxillary sinusitis. Electronically Signed   By: Van Clines M.D.   On: 04/28/2020 16:21   CT Cervical Spine Wo Contrast  Result Date: 04/28/2020 CLINICAL DATA:  Fall 3 days ago, neck trauma. EXAM: CT CERVICAL SPINE WITHOUT CONTRAST TECHNIQUE: Multidetector CT imaging of the cervical spine was performed without intravenous contrast. Multiplanar CT image reconstructions were also generated. COMPARISON:  CT cervical spine 11/11/2019 FINDINGS: Alignment: Mild reversal of the normal cervical lordosis. No subluxation  identified. Skull base and vertebrae: Spurring at the anterior C1-2 articulation. No cervical spine fracture or acute subluxation. No acute bony findings are observed. Soft tissues and spinal canal: Bilateral common carotid atherosclerotic calcification. Disc levels: Multilevel uncinate and facet spurring potentially with mild right foraminal impingement at C3-4. Loss of intervertebral disc height at multiple levels, most strikingly at C5-6 and C6-7. Upper chest: Biapical pleuroparenchymal scarring. Suspected emphysema. Other: No supplemental non-categorized findings. IMPRESSION: 1. No acute cervical spine findings. 2. Cervical spondylosis and degenerative disc disease. 3. Bilateral common carotid atherosclerotic calcification. 4. Suspected emphysema. Emphysema (ICD10-J43.9). Electronically Signed   By: Van Clines M.D.   On: 04/28/2020 16:26   DG Foot Complete Right  Result Date: 04/28/2020 CLINICAL DATA:  Right foot pain.  Fall 3 days ago. EXAM: RIGHT FOOT COMPLETE - 3+ VIEW COMPARISON:  None. FINDINGS: No convincing fracture.  No bone lesion. Joints are normally aligned. Small dorsal and plantar calcaneal spurs. Soft tissues are unremarkable. IMPRESSION: No fracture or dislocation. Electronically Signed   By: Lajean Manes M.D.   On: 04/28/2020 16:02   DG Hip Unilat With Pelvis 2-3 Views Right  Result Date: 04/28/2020 CLINICAL DATA:  Right hip pain after fall 3 days ago. EXAM: DG HIP (WITH OR WITHOUT PELVIS) 2-3V RIGHT COMPARISON:  None. FINDINGS: Displaced right femoral neck fracture with proximal migration of the femoral shaft. No significant angulation. The femoral head remains seated. Mild acetabular spurring. Pubic rami are intact. Bones are diffusely under mineralized. Soft tissue edema is noted laterally. There are vascular calcifications. IMPRESSION: Displaced right femoral neck fracture. Electronically Signed   By: Keith Rake M.D.   On: 04/28/2020 16:03       Assessment/Plan Principal Problem:   Femur fracture, right (HCC) Active Problems:   PAD (peripheral artery disease) s/p remote left external iliac artery angioplasty   Coronary atherosclerosis   Hypercholesterolemia   Essential hypertension   Diabetes mellitus type 2 in nonobese (HCC)   Carotid occlusion, right   PD (Parkinson's disease) ([place])     #1 right femoral neck fracture: Patient will be admitted and seems to be stable enough for surgery.  We will keep him n.p.o. after midnight.  Orthopedics already consulted and surgery planned.  We will follow closely.  #2 diabetes: Sliding scale insulin  #3 Parkinson's disease: Resume home regimen  #4 recurrent falls: Due to gait abnormalities.  PT OT evaluation  #5 carotid artery disease: Continue outpatient follow-up  #5 essential hypertension: Continue home regimen  #7 coronary artery disease: No active recent event.  Continue to monitor     DVT prophylaxis: Lovenox  Code Status: Full code Family Communication: Wife and daughter at bedside Disposition Plan: To be determined Consults called: Orthopedics Dr. Lyla Glassing Admission status: Inpatient  Severity of Illness: The appropriate patient status for this patient is INPATIENT. Inpatient status is judged to be reasonable and necessary in order to provide the required intensity of service to ensure the patient's safety. The patient's presenting symptoms, physical exam findings, and initial radiographic and laboratory data in the context of their chronic comorbidities is felt to place them at high risk for further clinical deterioration. Furthermore, it is not anticipated that the patient will be medically stable for discharge from the hospital within 2 midnights of admission. The following factors support the patient status of inpatient.   " The patient's presenting symptoms include right hip pain. " The worrisome physical exam findings include laterally rotated right  lower extremity. " The initial radiographic and laboratory data are worrisome because of right femoral neck fracture. " The chronic co-morbidities include diabetes and hypertension with Parkinson's disease.   * I certify that at the point of admission it is my clinical judgment that the patient will require inpatient hospital care spanning beyond 2 midnights from the point of admission due to high intensity of service, high risk for further deterioration and high frequency of surveillance required.Barbette Merino MD [facility] Pager [phone]  If 7PM-7AM, please contact night-coverage [email or web address] Password [facility]  04/28/2020, 6:23 PM

## 2020-04-28 NOTE — ED Triage Notes (Signed)
Pt came from home. Pt had a mechanical fall 3 days ago. Pt complains of Right hip pain and right ankle pain. Contusion on right elbow. Pt's family called hospital 3 days ago but was told to schedule an outpatient appt to be seen sooner. Outpatient ortho appt was today, but transport service refused to take pt d/t his condition. EMS was then called by family.   Right leg shortening, external rotation. Pt was able to walk the first day post- fall. Pt unable to bear weight today PMH of parkinsons, htn, diabetes and high cholesterol. Family suspects beginning of dementia onset currently. Pt is not on blood thinners. Pt denies head injury or LOC. A&Ox4 per EMS 144/70 72 bpm 97 % RA CBG: 217 Temp: 98.2

## 2020-04-28 NOTE — Anesthesia Preprocedure Evaluation (Addendum)
Anesthesia Evaluation  Patient identified by MRN, date of birth, ID band Patient confused    Reviewed: Allergy & Precautions, NPO status , Patient's Chart, lab work & pertinent test results  Airway Mallampati: II  TM Distance: >3 FB Neck ROM: Full    Dental no notable dental hx. (+) Upper Dentures, Lower Dentures, Dental Advisory Given   Pulmonary former smoker,    Pulmonary exam normal breath sounds clear to auscultation       Cardiovascular Exercise Tolerance: Good hypertension, Pt. on medications + CAD  negative cardio ROS Normal cardiovascular exam Rhythm:Regular Rate:Normal     Neuro/Psych parkinsons   Neuromuscular disease    GI/Hepatic Neg liver ROS,   Endo/Other  diabetes, Type 2  Renal/GU negative Renal ROSLab Results      Component                Value               Date                      CREATININE               0.84                04/28/2020                BUN                      11                  04/28/2020                NA                       139                 04/28/2020                K                        3.2 (L)             04/28/2020                CL                       104                 04/28/2020                CO2                      26                  04/28/2020                Musculoskeletal  (+) Arthritis ,   Abdominal   Peds  Hematology Lab Results      Component                Value               Date                      WBC  8.1                 04/28/2020                HGB                      14.3                04/28/2020                HCT                      43.6                04/28/2020                MCV                      97.3                04/28/2020                PLT                      162                 04/28/2020              Anesthesia Other Findings   Reproductive/Obstetrics                             Anesthesia Physical Anesthesia Plan  ASA: III and emergent  Anesthesia Plan: General   Post-op Pain Management:    Induction: Intravenous  PONV Risk Score and Plan: 2 and Treatment may vary due to age or medical condition and Ondansetron  Airway Management Planned: Oral ETT  Additional Equipment: None  Intra-op Plan:   Post-operative Plan: Extubation in OR  Informed Consent: I have reviewed the patients History and Physical, chart, labs and discussed the procedure including the risks, benefits and alternatives for the proposed anesthesia with the patient or authorized representative who has indicated his/her understanding and acceptance.     Dental advisory given  Plan Discussed with: CRNA and Anesthesiologist  Anesthesia Plan Comments:      Anesthesia Quick Evaluation

## 2020-04-29 ENCOUNTER — Encounter (HOSPITAL_COMMUNITY): Payer: Self-pay | Admitting: Internal Medicine

## 2020-04-29 ENCOUNTER — Inpatient Hospital Stay (HOSPITAL_COMMUNITY): Payer: Medicare Other | Admitting: Certified Registered Nurse Anesthetist

## 2020-04-29 ENCOUNTER — Encounter (HOSPITAL_COMMUNITY)
Admission: EM | Disposition: A | Discharge status: Home Health | Payer: Self-pay | Source: Home / Self Care | Attending: Internal Medicine

## 2020-04-29 ENCOUNTER — Inpatient Hospital Stay (HOSPITAL_COMMUNITY): Payer: Medicare Other

## 2020-04-29 DIAGNOSIS — S728X1D Other fracture of right femur, subsequent encounter for closed fracture with routine healing: Secondary | ICD-10-CM | POA: Diagnosis not present

## 2020-04-29 HISTORY — PX: TOTAL HIP ARTHROPLASTY: SHX124

## 2020-04-29 LAB — COMPREHENSIVE METABOLIC PANEL
ALT: 17 U/L (ref 0–44)
AST: 14 U/L — ABNORMAL LOW (ref 15–41)
Albumin: 3.1 g/dL — ABNORMAL LOW (ref 3.5–5.0)
Alkaline Phosphatase: 70 U/L (ref 38–126)
Anion gap: 8 (ref 5–15)
BUN: 14 mg/dL (ref 8–23)
CO2: 24 mmol/L (ref 22–32)
Calcium: 8.6 mg/dL — ABNORMAL LOW (ref 8.9–10.3)
Chloride: 110 mmol/L (ref 98–111)
Creatinine, Ser: 0.82 mg/dL (ref 0.61–1.24)
GFR, Estimated: >60 mL/min (ref >60)
Glucose, Bld: 102 mg/dL — ABNORMAL HIGH (ref 70–99)
Potassium: 3.4 mmol/L — ABNORMAL LOW (ref 3.5–5.1)
Sodium: 142 mmol/L (ref 135–145)
Total Bilirubin: 1.6 mg/dL — ABNORMAL HIGH (ref 0.3–1.2)
Total Protein: 6.1 g/dL — ABNORMAL LOW (ref 6.5–8.1)

## 2020-04-29 LAB — CBC
HCT: 38.6 % — ABNORMAL LOW (ref 39.0–52.0)
Hemoglobin: 12.6 g/dL — ABNORMAL LOW (ref 13.0–17.0)
MCH: 32 pg (ref 26.0–34.0)
MCHC: 32.6 g/dL (ref 30.0–36.0)
MCV: 98 fL (ref 80.0–100.0)
Platelets: 152 10*3/uL (ref 150–400)
RBC: 3.94 MIL/uL — ABNORMAL LOW (ref 4.22–5.81)
RDW: 13.1 % (ref 11.5–15.5)
WBC: 6.7 10*3/uL (ref 4.0–10.5)
nRBC: 0 % (ref 0.0–0.2)

## 2020-04-29 LAB — GLUCOSE, CAPILLARY
Glucose-Capillary: 120 mg/dL — ABNORMAL HIGH (ref 70–99)
Glucose-Capillary: 157 mg/dL — ABNORMAL HIGH (ref 70–99)
Glucose-Capillary: 177 mg/dL — ABNORMAL HIGH (ref 70–99)

## 2020-04-29 LAB — PROTIME-INR
INR: 1.3 — ABNORMAL HIGH (ref 0.8–1.2)
Prothrombin Time: 16.1 seconds — ABNORMAL HIGH (ref 11.4–15.2)

## 2020-04-29 SURGERY — ARTHROPLASTY, HIP, TOTAL, ANTERIOR APPROACH
Anesthesia: Spinal | Site: Hip | Laterality: Right

## 2020-04-29 MED ORDER — FENTANYL CITRATE (PF) 100 MCG/2ML IJ SOLN
INTRAMUSCULAR | Status: DC | PRN
  Administered 2020-04-29 (×2): 25 ug via INTRAVENOUS
  Administered 2020-04-29: 50 ug via INTRAVENOUS
  Administered 2020-04-29 (×2): 25 ug via INTRAVENOUS

## 2020-04-29 MED ORDER — ATORVASTATIN CALCIUM 20 MG PO TABS
20.0000 mg | ORAL_TABLET | Freq: Every day | ORAL | Status: DC
  Administered 2020-04-29 – 2020-05-05 (×7): 20 mg via ORAL
  Filled 2020-04-29 (×7): qty 1

## 2020-04-29 MED ORDER — WATER FOR IRRIGATION, STERILE IR SOLN
Status: DC | PRN
  Administered 2020-04-29: 1000 mL

## 2020-04-29 MED ORDER — METFORMIN HCL 850 MG PO TABS
850.0000 mg | ORAL_TABLET | ORAL | Status: DC
  Administered 2020-05-01 – 2020-05-05 (×3): 850 mg via ORAL
  Filled 2020-04-29 (×3): qty 1

## 2020-04-29 MED ORDER — BUPIVACAINE-EPINEPHRINE 0.25% -1:200000 IJ SOLN
INTRAMUSCULAR | Status: DC | PRN
  Administered 2020-04-29: 30 mL

## 2020-04-29 MED ORDER — LIDOCAINE HCL (PF) 2 % IJ SOLN
INTRAMUSCULAR | Status: AC
  Filled 2020-04-29: qty 5

## 2020-04-29 MED ORDER — CARBIDOPA-LEVODOPA ER 50-200 MG PO TBCR
1.0000 | EXTENDED_RELEASE_TABLET | Freq: Every day | ORAL | Status: DC
  Administered 2020-04-30 – 2020-05-05 (×6): 1 via ORAL
  Filled 2020-04-29 (×7): qty 1

## 2020-04-29 MED ORDER — FENTANYL CITRATE (PF) 100 MCG/2ML IJ SOLN: 25.0000 ug | INTRAMUSCULAR | Status: DC | PRN

## 2020-04-29 MED ORDER — ESMOLOL HCL 100 MG/10ML IV SOLN
INTRAVENOUS | Status: AC
  Filled 2020-04-29: qty 10

## 2020-04-29 MED ORDER — ROCURONIUM BROMIDE 10 MG/ML (PF) SYRINGE
PREFILLED_SYRINGE | INTRAVENOUS | Status: AC
  Filled 2020-04-29: qty 10

## 2020-04-29 MED ORDER — CARBIDOPA-LEVODOPA ER 25-100 MG PO TBCR
1.0000 | EXTENDED_RELEASE_TABLET | Freq: Four times a day (QID) | ORAL | Status: DC
  Administered 2020-04-30 – 2020-05-05 (×23): 1 via ORAL
  Filled 2020-04-29 (×25): qty 1

## 2020-04-29 MED ORDER — PROPOFOL 500 MG/50ML IV EMUL
INTRAVENOUS | Status: AC
  Filled 2020-04-29: qty 50

## 2020-04-29 MED ORDER — SENNA 8.6 MG PO TABS
1.0000 | ORAL_TABLET | Freq: Two times a day (BID) | ORAL | Status: DC
  Administered 2020-04-30 – 2020-05-05 (×7): 8.6 mg via ORAL
  Filled 2020-04-29 (×11): qty 1

## 2020-04-29 MED ORDER — CEFAZOLIN SODIUM-DEXTROSE 2-4 GM/100ML-% IV SOLN
2.0000 g | Freq: Four times a day (QID) | INTRAVENOUS | Status: AC
  Administered 2020-04-29 – 2020-04-30 (×2): 2 g via INTRAVENOUS
  Filled 2020-04-29 (×2): qty 100

## 2020-04-29 MED ORDER — FENTANYL CITRATE (PF) 100 MCG/2ML IJ SOLN
INTRAMUSCULAR | Status: AC
  Filled 2020-04-29: qty 2

## 2020-04-29 MED ORDER — MORPHINE SULFATE (PF) 2 MG/ML IV SOLN: 0.5000 mg | INTRAVENOUS | Status: DC | PRN

## 2020-04-29 MED ORDER — DOCUSATE SODIUM 100 MG PO CAPS
100.0000 mg | ORAL_CAPSULE | Freq: Two times a day (BID) | ORAL | Status: DC
  Administered 2020-04-30 – 2020-05-05 (×7): 100 mg via ORAL
  Filled 2020-04-29 (×11): qty 1

## 2020-04-29 MED ORDER — SUGAMMADEX SODIUM 200 MG/2ML IV SOLN
INTRAVENOUS | Status: DC | PRN
  Administered 2020-04-29: 150 mg via INTRAVENOUS

## 2020-04-29 MED ORDER — ASPIRIN EC 81 MG PO TBEC
81.0000 mg | DELAYED_RELEASE_TABLET | Freq: Every day | ORAL | Status: DC
Start: 2020-04-29 — End: 2020-05-06
  Administered 2020-04-29 – 2020-05-05 (×7): 81 mg via ORAL
  Filled 2020-04-29 (×7): qty 1

## 2020-04-29 MED ORDER — ISOPROPYL ALCOHOL 70 % SOLN
Status: DC | PRN
  Administered 2020-04-29: 1 via TOPICAL

## 2020-04-29 MED ORDER — ACETAMINOPHEN 10 MG/ML IV SOLN: 1000.0000 mg | Freq: Once | INTRAVENOUS | Status: DC | PRN

## 2020-04-29 MED ORDER — DEXAMETHASONE SODIUM PHOSPHATE 10 MG/ML IJ SOLN
INTRAMUSCULAR | Status: DC | PRN
  Administered 2020-04-29: 5 mg via INTRAVENOUS

## 2020-04-29 MED ORDER — HYDROCODONE-ACETAMINOPHEN 5-325 MG PO TABS
1.0000 | ORAL_TABLET | ORAL | Status: DC | PRN
  Administered 2020-04-30 – 2020-05-03 (×4): 1 via ORAL
  Filled 2020-04-29 (×5): qty 1

## 2020-04-29 MED ORDER — PHENYLEPHRINE 40 MCG/ML (10ML) SYRINGE FOR IV PUSH (FOR BLOOD PRESSURE SUPPORT)
PREFILLED_SYRINGE | INTRAVENOUS | Status: AC
  Filled 2020-04-29: qty 20

## 2020-04-29 MED ORDER — ONDANSETRON HCL 4 MG/2ML IJ SOLN: 4.0000 mg | Freq: Four times a day (QID) | INTRAMUSCULAR | Status: DC | PRN

## 2020-04-29 MED ORDER — KETOROLAC TROMETHAMINE 30 MG/ML IJ SOLN
INTRAMUSCULAR | Status: AC
  Filled 2020-04-29: qty 1

## 2020-04-29 MED ORDER — CEFAZOLIN SODIUM-DEXTROSE 2-4 GM/100ML-% IV SOLN
INTRAVENOUS | Status: AC
  Filled 2020-04-29: qty 100

## 2020-04-29 MED ORDER — SODIUM CHLORIDE 0.9 % IR SOLN
Status: DC | PRN
  Administered 2020-04-29: 1000 mL

## 2020-04-29 MED ORDER — SODIUM CHLORIDE (PF) 0.9 % IJ SOLN
INTRAMUSCULAR | Status: DC | PRN
  Administered 2020-04-29: 30 mL

## 2020-04-29 MED ORDER — TRANEXAMIC ACID-NACL 1000-0.7 MG/100ML-% IV SOLN
INTRAVENOUS | Status: AC
  Filled 2020-04-29: qty 100

## 2020-04-29 MED ORDER — ONDANSETRON HCL 4 MG PO TABS: 4.0000 mg | ORAL_TABLET | Freq: Four times a day (QID) | ORAL | Status: DC | PRN

## 2020-04-29 MED ORDER — INSULIN ASPART 100 UNIT/ML ~~LOC~~ SOLN
0.0000 [IU] | Freq: Three times a day (TID) | SUBCUTANEOUS | Status: DC
  Administered 2020-04-29 – 2020-04-30 (×3): 2 [IU] via SUBCUTANEOUS
  Administered 2020-05-01 – 2020-05-02 (×4): 1 [IU] via SUBCUTANEOUS
  Administered 2020-05-02: 2 [IU] via SUBCUTANEOUS
  Administered 2020-05-03: 1 [IU] via SUBCUTANEOUS
  Administered 2020-05-04: 3 [IU] via SUBCUTANEOUS
  Administered 2020-05-05 (×2): 1 [IU] via SUBCUTANEOUS

## 2020-04-29 MED ORDER — LACTATED RINGERS IV SOLN: INTRAVENOUS | Status: DC | PRN

## 2020-04-29 MED ORDER — CEFAZOLIN SODIUM-DEXTROSE 2-3 GM-%(50ML) IV SOLR
INTRAVENOUS | Status: DC | PRN
  Administered 2020-04-29: 2 g via INTRAVENOUS

## 2020-04-29 MED ORDER — BUPIVACAINE-EPINEPHRINE (PF) 0.25% -1:200000 IJ SOLN
INTRAMUSCULAR | Status: AC
  Filled 2020-04-29: qty 30

## 2020-04-29 MED ORDER — ACETAMINOPHEN 325 MG PO TABS
325.0000 mg | ORAL_TABLET | Freq: Four times a day (QID) | ORAL | Status: DC | PRN
  Administered 2020-05-02 – 2020-05-05 (×9): 650 mg via ORAL
  Filled 2020-04-29 (×9): qty 2

## 2020-04-29 MED ORDER — ONDANSETRON HCL 4 MG/2ML IJ SOLN
INTRAMUSCULAR | Status: DC | PRN
  Administered 2020-04-29: 4 mg via INTRAVENOUS

## 2020-04-29 MED ORDER — PROPOFOL 10 MG/ML IV BOLUS
INTRAVENOUS | Status: AC
  Filled 2020-04-29: qty 20

## 2020-04-29 MED ORDER — ONDANSETRON HCL 4 MG/2ML IJ SOLN
INTRAMUSCULAR | Status: AC
  Filled 2020-04-29: qty 2

## 2020-04-29 MED ORDER — METOCLOPRAMIDE HCL 5 MG/ML IJ SOLN: 5.0000 mg | Freq: Three times a day (TID) | INTRAMUSCULAR | Status: DC | PRN

## 2020-04-29 MED ORDER — ONDANSETRON HCL 4 MG/2ML IJ SOLN: 4.0000 mg | Freq: Once | INTRAMUSCULAR | Status: DC | PRN

## 2020-04-29 MED ORDER — SODIUM CHLORIDE 0.9 % IR SOLN
Status: DC | PRN
  Administered 2020-04-29: 3000 mL

## 2020-04-29 MED ORDER — QUETIAPINE FUMARATE 25 MG PO TABS
12.5000 mg | ORAL_TABLET | Freq: Every day | ORAL | Status: DC
  Administered 2020-04-30 – 2020-05-05 (×6): 12.5 mg via ORAL
  Filled 2020-04-29 (×7): qty 1

## 2020-04-29 MED ORDER — MENTHOL 3 MG MT LOZG: 1.0000 | LOZENGE | OROMUCOSAL | Status: DC | PRN

## 2020-04-29 MED ORDER — SODIUM CHLORIDE (PF) 0.9 % IJ SOLN
INTRAMUSCULAR | Status: AC
  Filled 2020-04-29: qty 30

## 2020-04-29 MED ORDER — PHENOL 1.4 % MT LIQD
1.0000 | OROMUCOSAL | Status: DC | PRN
  Filled 2020-04-29: qty 177

## 2020-04-29 MED ORDER — BUPIVACAINE HCL (PF) 0.5 % IJ SOLN
INTRAMUSCULAR | Status: AC
  Filled 2020-04-29: qty 30

## 2020-04-29 MED ORDER — CHLORHEXIDINE GLUCONATE CLOTH 2 % EX PADS
6.0000 | MEDICATED_PAD | Freq: Every day | CUTANEOUS | Status: DC
  Administered 2020-04-29 – 2020-05-05 (×4): 6 via TOPICAL

## 2020-04-29 MED ORDER — LIDOCAINE HCL (CARDIAC) PF 100 MG/5ML IV SOSY
PREFILLED_SYRINGE | INTRAVENOUS | Status: DC | PRN
  Administered 2020-04-29: 40 mg via INTRAVENOUS

## 2020-04-29 MED ORDER — ISOPROPYL ALCOHOL 70 % SOLN
Status: AC
  Filled 2020-04-29: qty 480

## 2020-04-29 MED ORDER — METOCLOPRAMIDE HCL 5 MG PO TABS: 5.0000 mg | ORAL_TABLET | Freq: Three times a day (TID) | ORAL | Status: DC | PRN

## 2020-04-29 MED ORDER — INSULIN ASPART 100 UNIT/ML ~~LOC~~ SOLN: 0.0000 [IU] | Freq: Every day | SUBCUTANEOUS | Status: DC

## 2020-04-29 MED ORDER — HYDROCODONE-ACETAMINOPHEN 7.5-325 MG PO TABS
1.0000 | ORAL_TABLET | ORAL | Status: DC | PRN
Start: 2020-04-29 — End: 2020-05-06
  Administered 2020-05-01: 2 via ORAL
  Filled 2020-04-29: qty 2

## 2020-04-29 MED ORDER — DEXAMETHASONE SODIUM PHOSPHATE 10 MG/ML IJ SOLN
INTRAMUSCULAR | Status: AC
  Filled 2020-04-29: qty 1

## 2020-04-29 MED ORDER — KETOROLAC TROMETHAMINE 30 MG/ML IJ SOLN
INTRAMUSCULAR | Status: DC | PRN
  Administered 2020-04-29: 30 mg

## 2020-04-29 MED ORDER — ENOXAPARIN SODIUM 40 MG/0.4ML ~~LOC~~ SOLN
40.0000 mg | SUBCUTANEOUS | Status: DC
  Administered 2020-04-30 – 2020-05-05 (×6): 40 mg via SUBCUTANEOUS
  Filled 2020-04-29 (×7): qty 0.4

## 2020-04-29 MED ORDER — PHENYLEPHRINE 40 MCG/ML (10ML) SYRINGE FOR IV PUSH (FOR BLOOD PRESSURE SUPPORT)
PREFILLED_SYRINGE | INTRAVENOUS | Status: DC | PRN
  Administered 2020-04-29 (×2): 80 ug via INTRAVENOUS

## 2020-04-29 MED ORDER — VITAMIN D 25 MCG (1000 UNIT) PO TABS
1000.0000 [IU] | ORAL_TABLET | Freq: Every day | ORAL | Status: DC
  Administered 2020-04-29 – 2020-05-05 (×7): 1000 [IU] via ORAL
  Filled 2020-04-29 (×7): qty 1

## 2020-04-29 MED ORDER — ROCURONIUM BROMIDE 10 MG/ML (PF) SYRINGE
PREFILLED_SYRINGE | INTRAVENOUS | Status: DC | PRN
  Administered 2020-04-29: 10 mg via INTRAVENOUS
  Administered 2020-04-29: 50 mg via INTRAVENOUS

## 2020-04-29 MED ORDER — PROPOFOL 10 MG/ML IV BOLUS
INTRAVENOUS | Status: DC | PRN
  Administered 2020-04-29: 70 mg via INTRAVENOUS
  Administered 2020-04-29: 120 mg via INTRAVENOUS

## 2020-04-29 SURGICAL SUPPLY — 66 items
ADH SKN CLS APL DERMABOND .7 (GAUZE/BANDAGES/DRESSINGS) ×1
APL PRP STRL LF DISP 70% ISPRP (MISCELLANEOUS) ×1
BAG DECANTER FOR FLEXI CONT (MISCELLANEOUS) IMPLANT
BAG SPEC THK2 15X12 ZIP CLS (MISCELLANEOUS)
BAG ZIPLOCK 12X15 (MISCELLANEOUS) IMPLANT
BLADE SURG SZ10 CARB STEEL (BLADE) IMPLANT
CHLORAPREP W/TINT 26 (MISCELLANEOUS) ×2 IMPLANT
COVER PERINEAL POST (MISCELLANEOUS) ×2 IMPLANT
COVER SURGICAL LIGHT HANDLE (MISCELLANEOUS) ×2 IMPLANT
COVER WAND RF STERILE (DRAPES) IMPLANT
CUP ACET PINNACLE SECTR 60MM (Hips) IMPLANT
DECANTER SPIKE VIAL GLASS SM (MISCELLANEOUS) ×2 IMPLANT
DERMABOND ADVANCED (GAUZE/BANDAGES/DRESSINGS) ×1
DERMABOND ADVANCED .7 DNX12 (GAUZE/BANDAGES/DRESSINGS) ×2 IMPLANT
DRAPE IMP U-DRAPE 54X76 (DRAPES) ×2 IMPLANT
DRAPE SHEET LG 3/4 BI-LAMINATE (DRAPES) ×6 IMPLANT
DRAPE STERI IOBAN 125X83 (DRAPES) IMPLANT
DRAPE U-SHAPE 47X51 STRL (DRAPES) ×4 IMPLANT
DRSG AQUACEL AG ADV 3.5X10 (GAUZE/BANDAGES/DRESSINGS) ×2 IMPLANT
ELECT REM PT RETURN 15FT ADLT (MISCELLANEOUS) ×2 IMPLANT
GAUZE SPONGE 4X4 12PLY STRL (GAUZE/BANDAGES/DRESSINGS) ×2 IMPLANT
GLOVE BIO SURGEON STRL SZ8.5 (GLOVE) ×4 IMPLANT
GLOVE BIOGEL PI IND STRL 8.5 (GLOVE) ×1 IMPLANT
GLOVE BIOGEL PI INDICATOR 8.5 (GLOVE) ×1
GLOVE SRG 8 PF TXTR STRL LF DI (GLOVE) ×1 IMPLANT
GLOVE SURG ENC TEXT LTX SZ7.5 (GLOVE) ×4 IMPLANT
GLOVE SURG UNDER POLY LF SZ8 (GLOVE) ×2
GOWN SPEC L3 XXLG W/TWL (GOWN DISPOSABLE) ×2 IMPLANT
GOWN STRL REUS W/ TWL LRG LVL3 (GOWN DISPOSABLE) ×1 IMPLANT
GOWN STRL REUS W/TWL LRG LVL3 (GOWN DISPOSABLE) ×2
HANDPIECE INTERPULSE COAX TIP (DISPOSABLE) ×2
HEAD M SROM 36MM PLUS 1.5 (Hips) IMPLANT
HOLDER FOLEY CATH W/STRAP (MISCELLANEOUS) ×2 IMPLANT
HOOD PEEL AWAY FLYTE STAYCOOL (MISCELLANEOUS) ×8 IMPLANT
JET LAVAGE IRRISEPT WOUND (IRRIGATION / IRRIGATOR) ×2
KIT TURNOVER KIT A (KITS) IMPLANT
LAVAGE JET IRRISEPT WOUND (IRRIGATION / IRRIGATOR) ×1 IMPLANT
LINER NEUTRAL 58X36MM PLUS4 ×1 IMPLANT
MANIFOLD NEPTUNE II (INSTRUMENTS) ×2 IMPLANT
MARKER SKIN DUAL TIP RULER LAB (MISCELLANEOUS) ×2 IMPLANT
NDL SAFETY ECLIPSE 18X1.5 (NEEDLE) ×1 IMPLANT
NDL SPNL 18GX3.5 QUINCKE PK (NEEDLE) ×1 IMPLANT
NEEDLE HYPO 18GX1.5 SHARP (NEEDLE) ×2
NEEDLE SPNL 18GX3.5 QUINCKE PK (NEEDLE) ×2 IMPLANT
PACK ANTERIOR HIP CUSTOM (KITS) ×2 IMPLANT
PENCIL SMOKE EVACUATOR (MISCELLANEOUS) IMPLANT
PINNSECTOR W/GRIP ACE CUP 60MM (Hips) ×2 IMPLANT
SAW OSC TIP CART 19.5X105X1.3 (SAW) ×2 IMPLANT
SEALER BIPOLAR AQUA 6.0 (INSTRUMENTS) ×2 IMPLANT
SET HNDPC FAN SPRY TIP SCT (DISPOSABLE) ×1 IMPLANT
SROM M HEAD 36MM PLUS 1.5 (Hips) ×2 IMPLANT
STEM TRI LOC BPS GRIP SZ9 OFFS IMPLANT
SUT ETHIBOND NAB CT1 #1 30IN (SUTURE) ×4 IMPLANT
SUT MNCRL AB 3-0 PS2 18 (SUTURE) ×2 IMPLANT
SUT MNCRL AB 4-0 PS2 18 (SUTURE) ×2 IMPLANT
SUT MON AB 2-0 CT1 36 (SUTURE) ×4 IMPLANT
SUT STRATAFIX PDO 1 14 VIOLET (SUTURE) ×2
SUT STRATFX PDO 1 14 VIOLET (SUTURE) ×1
SUT VIC AB 2-0 CT1 27 (SUTURE) ×2
SUT VIC AB 2-0 CT1 TAPERPNT 27 (SUTURE) ×1 IMPLANT
SUTURE STRATFX PDO 1 14 VIOLET (SUTURE) ×1 IMPLANT
SYR 3ML LL SCALE MARK (SYRINGE) ×2 IMPLANT
TRAY FOLEY MTR SLVR 16FR STAT (SET/KITS/TRAYS/PACK) IMPLANT
TRI LOC BPS W/GRIP SZ 9 OFFS ×2 IMPLANT
TUBE SUCTION HIGH CAP CLEAR NV (SUCTIONS) ×2 IMPLANT
WATER STERILE IRR 1000ML POUR (IV SOLUTION) ×2 IMPLANT

## 2020-04-29 NOTE — Consult Note (Addendum)
ORTHOPAEDIC CONSULTATION  REQUESTING PHYSICIAN: Hosie Poisson, MD  PCP:  Lujean Amel, MD  Chief Complaint: Right hip pain  HPI: Rodney Love a 75 y.o.malewith medical history significant ofParkinson's disease, diabetes, coronary artery disease, hypertension and hyperlipidemia who apparently sustained a mechanical fall at home. Patient is here with his wife who reported the fall about 5 days ago.  He has been experiencing right sided hip pain since the fall.  The patient came to the ED and was found to have a right femoral neck fracture. He was admitted by the hospitalist service for medical management in anticipation of possible surgery  Past Medical History:  Diagnosis Date  . Arthritis   . Constipation    OCCASIONAL  . Coronary artery disease   . Diabetes mellitus without complication ([place])   . Environmental allergies   . Hyperlipidemia   . Hypertension   . Inguinal hernia   . Iron deficiency   . Nocturia   . Parkinson disease ([place])   . Peripheral arterial disease [facility])    Past Surgical History:  Procedure Laterality Date  . BACK SURGERY    . CARDIAC CATHETERIZATION  05/29/2006   single vessel CAD w/moderate stenosis of the prox. RCA approx 40%,prox. LAD 20-30%,minor irreg. mid abdominal aorta,left iliac artery disease 50-60%  . CATARACT EXTRACTION Left 12/2016  . HERNIA REPAIR  [id number]  . INGUINAL HERNIA REPAIR N/A 09/23/2012   Procedure: LAPAROSCOPIC INGUINAL HERNIA with umbilical hernia;  Surgeon: Madilyn Hook, DO;  Location: [facility];  Service: General;  Laterality: N/A;  . INSERTION OF MESH Bilateral 09/23/2012   Procedure: INSERTION OF MESH;  Surgeon: Madilyn Hook, DO;  Location: [facility];  Service: General;  Laterality: Bilateral;   Social History   Socioeconomic History  . Marital status: Married    Spouse name: [name]  . Number of children: 3  . Years of education: 9  . Highest education level: 9th grade  Occupational History  . Occupation:  retired    Comment: Architect  Tobacco Use  . Smoking status: Former Smoker    Quit date: 04/02/1991    Years since quitting: 29.0  . Smokeless tobacco: Former Systems developer    Quit date: 08/06/1991  Vaping Use  . Vaping Use: Never used  Substance and Sexual Activity  . Alcohol use: No  . Drug use: No  . Sexual activity: Not on file  Other Topics Concern  . Not on file  Social History Narrative   Patient is right-handed. He lives with his wife [name] in a one level home. He walks daily.   Social Determinants of Health   Financial Resource Strain: Not on file  Food Insecurity: Not on file  Transportation Needs: Not on file  Physical Activity: Not on file  Stress: Not on file  Social Connections: Not on file   Family History  Problem Relation Age of Onset  . Diabetes Mother   . Kidney disease Mother   . Stroke Father   . Stroke Brother   . Cancer Sister   . Cancer Sister   . Diabetes Sister   . Healthy Daughter   . Healthy Daughter   . Healthy Son    No Known Allergies Prior to Admission medications   Medication Sig Start Date End Date Taking? Authorizing Provider  acetaminophen (TYLENOL) 500 MG tablet Take 500 mg by mouth every 6 (six) hours as needed for moderate pain.   Yes [provider]  aspirin EC 81 MG tablet Take  81 mg by mouth daily.   Yes [provider]  atorvastatin (LIPITOR) 20 MG tablet Take 20 mg by mouth daily.  01/27/13  Yes [provider]  carbidopa-levodopa (SINEMET CR) 50-200 MG tablet TAKE 1 TABLET BY MOUTH AT BEDTIME 02/10/20  Yes Tat, Rebecca S, DO  Carbidopa-Levodopa ER (SINEMET CR) 25-100 MG tablet controlled release TAKE 1 TABLET BY MOUTH EVERY MORNING, AT NOON, IN THE EVENING. AND AT BEDTIME Patient taking differently: Take 1 tablet by mouth in the morning, at noon, in the evening, and at bedtime. 02/21/20  Yes Tat, Eustace Quail, DO  cholecalciferol (VITAMIN D3) 25 MCG (1000 UNIT) tablet Take 1,000 Units by mouth daily.   Yes  [provider]  Cyanocobalamin (VITAMIN B 12 PO) Take 1 tablet by mouth daily.   Yes [provider]  ferrous sulfate 324 MG TBEC Take 324 mg by mouth daily at 6 (six) AM.   Yes [provider]  metFORMIN (GLUCOPHAGE) 850 MG tablet Take 850 mg by mouth 3 (three) times a week. Mon/Wed/Fri   Yes [provider]  ONETOUCH ULTRA test strip  02/04/19   [provider]  QUEtiapine (SEROQUEL) 25 MG tablet 1/2 in the AM and 1 at bed.  May add another 1/2 tablet in the late afternoon if need Patient taking differently: Take 12.5 mg by mouth at bedtime. 03/17/20   Tat, Eustace Quail, DO  pramipexole (MIRAPEX) 0.125 MG tablet Take 0.125 mg by mouth at bedtime.  10/15/19  [provider]   DG Chest 1 View  Result Date: 04/28/2020 CLINICAL DATA:  Preop EXAM: CHEST  1 VIEW COMPARISON:  09/03/2019 FINDINGS: Loop recorder device projects over the left chest. Heart and mediastinal contours are within normal limits. No focal opacities or effusions. No acute bony abnormality. IMPRESSION: No active disease. Electronically Signed   By: Rolm Baptise M.D.   On: 04/28/2020 18:21   CT Head Wo Contrast  Result Date: 04/28/2020 CLINICAL DATA:  Mechanical fall 3 days ago, head trauma. EXAM: CT HEAD WITHOUT CONTRAST TECHNIQUE: Contiguous axial images were obtained from the base of the skull through the vertex without intravenous contrast. COMPARISON:  None. FINDINGS: Brain: The brainstem, cerebellum, cerebral peduncles, thalami, basal ganglia, basilar cisterns, and ventricular system appear within normal limits. Periventricular white matter and corona radiata hypodensities favor chronic ischemic microvascular white matter disease. No intracranial hemorrhage, mass lesion, or acute CVA. Vascular: There is atherosclerotic calcification of the cavernous carotid arteries bilaterally. Skull: Unremarkable Sinuses/Orbits: Chronic bilateral maxillary sinusitis with a superimposed air-level  in the left maxillary sinus compatible with acute left maxillary sinusitis. Other: No supplemental non-categorized findings. IMPRESSION: 1. No acute intracranial findings. 2. Periventricular white matter and corona radiata hypodensities favor chronic ischemic microvascular white matter disease. 3. Chronic bilateral maxillary sinusitis with a superimposed air-level in the left maxillary sinus compatible with acute left maxillary sinusitis. Electronically Signed   By: Van Clines M.D.   On: 04/28/2020 16:21   CT Cervical Spine Wo Contrast  Result Date: 04/28/2020 CLINICAL DATA:  Fall 3 days ago, neck trauma. EXAM: CT CERVICAL SPINE WITHOUT CONTRAST TECHNIQUE: Multidetector CT imaging of the cervical spine was performed without intravenous contrast. Multiplanar CT image reconstructions were also generated. COMPARISON:  CT cervical spine 11/11/2019 FINDINGS: Alignment: Mild reversal of the normal cervical lordosis. No subluxation identified. Skull base and vertebrae: Spurring at the anterior C1-2 articulation. No cervical spine fracture or acute subluxation. No acute bony findings are observed. Soft tissues and spinal canal:  Bilateral common carotid atherosclerotic calcification. Disc levels: Multilevel uncinate and facet spurring potentially with mild right foraminal impingement at C3-4. Loss of intervertebral disc height at multiple levels, most strikingly at C5-6 and C6-7. Upper chest: Biapical pleuroparenchymal scarring. Suspected emphysema. Other: No supplemental non-categorized findings. IMPRESSION: 1. No acute cervical spine findings. 2. Cervical spondylosis and degenerative disc disease. 3. Bilateral common carotid atherosclerotic calcification. 4. Suspected emphysema. Emphysema (ICD10-J43.9). Electronically Signed   By: Van Clines M.D.   On: 04/28/2020 16:26   DG Knee Complete 4 Views Right  Result Date: 04/28/2020 CLINICAL DATA:  Fall, right hip fracture EXAM: RIGHT KNEE - COMPLETE 4+  VIEW COMPARISON:  None. FINDINGS: No evidence of fracture, dislocation, or joint effusion. No evidence of arthropathy or other focal bone abnormality. Soft tissues are unremarkable. IMPRESSION: Negative. Electronically Signed   By: Rolm Baptise M.D.   On: 04/28/2020 18:21   DG Foot Complete Right  Result Date: 04/28/2020 CLINICAL DATA:  Right foot pain.  Fall 3 days ago. EXAM: RIGHT FOOT COMPLETE - 3+ VIEW COMPARISON:  None. FINDINGS: No convincing fracture.  No bone lesion. Joints are normally aligned. Small dorsal and plantar calcaneal spurs. Soft tissues are unremarkable. IMPRESSION: No fracture or dislocation. Electronically Signed   By: Lajean Manes M.D.   On: 04/28/2020 16:02   DG Hip Unilat With Pelvis 2-3 Views Right  Result Date: 04/28/2020 CLINICAL DATA:  Right hip pain after fall 3 days ago. EXAM: DG HIP (WITH OR WITHOUT PELVIS) 2-3V RIGHT COMPARISON:  None. FINDINGS: Displaced right femoral neck fracture with proximal migration of the femoral shaft. No significant angulation. The femoral head remains seated. Mild acetabular spurring. Pubic rami are intact. Bones are diffusely under mineralized. Soft tissue edema is noted laterally. There are vascular calcifications. IMPRESSION: Displaced right femoral neck fracture. Electronically Signed   By: Keith Rake M.D.   On: 04/28/2020 16:03    Positive ROS: All other systems have been reviewed and were otherwise negative with the exception of those mentioned in the HPI and as above.  Physical Exam: General: Alert, no acute distress Cardiovascular: No pedal edema Respiratory: No cyanosis, no use of accessory musculature GI: No organomegaly, abdomen is soft and non-tender Skin: No lesions in the area of chief complaint Neurologic: Sensation intact distally Psychiatric: Patient is competent for consent with normal mood and affect Lymphatic: No axillary or cervical lymphadenopathy  MUSCULOSKELETAL: RLE shortened and rotated. Plantar and  dorsiflexion intact . Palpable pedal pulse.   Assessment: Displaced right femoral neck fracture  Plan: Right hip fracture: Plan on total hip arthroplasty with Dr.Aryah Doering. Keep NPO. Hold chemical DVT prophylaxis.   Diabetes, Parkinson's, CAD, HTN: Manage per hospitalist recommendations  The risks, benefits, and alternatives were discussed with the patient / wife. There are risks associated with the surgery including, but not limited to, problems with anesthesia (death), infection, instability (giving out of the joint), dislocation, differences in leg length/angulation/rotation, fracture of bones, loosening or failure of implants, hematoma (blood accumulation) which may require surgical drainage, blood clots, pulmonary embolism, nerve injury (foot drop and lateral thigh numbness), and blood vessel injury. The patient / wife understand these risks and elects to proceed.   Dorothyann Peng, PA    Bertram Savin, MD [phone]    04/29/2020 9:31 AM

## 2020-04-29 NOTE — ED Notes (Signed)
Family at bedside. Gave wife a cup of coffee

## 2020-04-29 NOTE — ED Notes (Signed)
Called PACU for surgery ETA. They estimated around 12 PM and recommend CHG bath around 10:30 AM.

## 2020-04-29 NOTE — Discharge Instructions (Signed)
°Dr. Katye Valek °Joint Replacement Specialist °[facility] °[address] °[place], [place] [address] °[phone] ° ° °TOTAL HIP REPLACEMENT POSTOPERATIVE DIRECTIONS ° ° ° °Hip Rehabilitation, Guidelines Following Surgery  ° °WEIGHT BEARING °Weight bearing as tolerated with assist device (walker, cane, etc) as directed, use it as long as suggested by your surgeon or therapist, typically at least 4-6 weeks. ° °The results of a hip operation are greatly improved after range of motion and muscle strengthening exercises. Follow all safety measures which are given to protect your hip. If any of these exercises cause increased pain or swelling in your joint, decrease the amount until you are comfortable again. Then slowly increase the exercises. Call your caregiver if you have problems or questions.  ° °HOME CARE INSTRUCTIONS  °Most of the following instructions are designed to prevent the dislocation of your new hip.  °Remove items at home which could result in a fall. This includes throw rugs or furniture in walking pathways.  °Continue medications as instructed at time of discharge. °· You may have some home medications which will be placed on hold until you complete the course of blood thinner medication. °· You may start showering once you are discharged home. Do not remove your dressing. °Do not put on socks or shoes without following the instructions of your caregivers.   °Sit on chairs with arms. Use the chair arms to help push yourself up when arising.  °Arrange for the use of a toilet seat elevator so you are not sitting low.  °· Walk with walker as instructed.  °You may resume a sexual relationship in one month or when given the OK by your caregiver.  °Use walker as long as suggested by your caregivers.  °You may put full weight on your legs and walk as much as is comfortable. °Avoid periods of inactivity such as sitting longer than an hour when not asleep. This helps prevent  blood clots.  °You may return to work once you are cleared by your surgeon.  °Do not drive a car for 6 weeks or until released by your surgeon.  °Do not drive while taking narcotics.  °Wear elastic stockings for two weeks following surgery during the day but you may remove then at night.  °Make sure you keep all of your appointments after your operation with all of your doctors and caregivers. You should call the office at the above phone number and make an appointment for approximately two weeks after the date of your surgery. °Please pick up a stool softener and laxative for home use as long as you are requiring pain medications. °· ICE to the affected hip every three hours for 30 minutes at a time and then as needed for pain and swelling. Continue to use ice on the hip for pain and swelling from surgery. You may notice swelling that will progress down to the foot and ankle.  This is normal after surgery.  Elevate the leg when you are not up walking on it.   °It is important for you to complete the blood thinner medication as prescribed by your doctor. °· Continue to use the breathing machine which will help keep your temperature down.  It is common for your temperature to cycle up and down following surgery, especially at night when you are not up moving around and exerting yourself.  The breathing machine keeps your lungs expanded and your temperature down. ° °RANGE OF MOTION AND STRENGTHENING EXERCISES  °These exercises are   designed to help you keep full movement of your hip joint. Follow your caregiver's or physical therapist's instructions. Perform all exercises about fifteen times, three times per day or as directed. Exercise both hips, even if you have had only one joint replacement. These exercises can be done on a training (exercise) mat, on the floor, on a table or on a bed. Use whatever works the best and is most comfortable for you. Use music or television while you are exercising so that the exercises  are a pleasant break in your day. This will make your life better with the exercises acting as a break in routine you can look forward to.  °Lying on your back, slowly slide your foot toward your buttocks, raising your knee up off the floor. Then slowly slide your foot back down until your leg is straight again.  °Lying on your back spread your legs as far apart as you can without causing discomfort.  °Lying on your side, raise your upper leg and foot straight up from the floor as far as is comfortable. Slowly lower the leg and repeat.  °Lying on your back, tighten up the muscle in the front of your thigh (quadriceps muscles). You can do this by keeping your leg straight and trying to raise your heel off the floor. This helps strengthen the largest muscle supporting your knee.  °Lying on your back, tighten up the muscles of your buttocks both with the legs straight and with the knee bent at a comfortable angle while keeping your heel on the floor.  ° °SKILLED REHAB INSTRUCTIONS: °If the patient is transferred to a skilled rehab facility following release from the hospital, a list of the current medications will be sent to the facility for the patient to continue.  When discharged from the skilled rehab facility, please have the facility set up the patient's Home Health Physical Therapy prior to being released. Also, the skilled facility will be responsible for providing the patient with their medications at time of release from the facility to include their pain medication and their blood thinner medication. If the patient is still at the rehab facility at time of the two week follow up appointment, the skilled rehab facility will also need to assist the patient in arranging follow up appointment in our office and any transportation needs. ° °MAKE SURE YOU:  °Understand these instructions.  °Will watch your condition.  °Will get help right away if you are not doing well or get worse. ° °Pick up stool softner and  laxative for home use following surgery while on pain medications. °Do not remove your dressing. °The dressing is waterproof--it is OK to take showers. °Continue to use ice for pain and swelling after surgery. °Do not use any lotions or creams on the incision until instructed by your surgeon. °Total Hip Protocol. ° ° °

## 2020-04-29 NOTE — ED Notes (Signed)
Cleaned bowel movement. Pt has skin breakdown on scrotum. Changed pt bed linen and gown. Gave CHG bath. Condom cath in place.

## 2020-04-29 NOTE — Progress Notes (Signed)
Carelink Summary Report / Loop Recorder 

## 2020-04-29 NOTE — Op Note (Signed)
OPERATIVE REPORT  SURGEON: Rod Can, MD   ASSISTANT: Cherlynn June, PA-C  PREOPERATIVE DIAGNOSIS: Displaced Right femoral neck fracture.   POSTOPERATIVE DIAGNOSIS: Displaced Right femoral neck fracture.   PROCEDURE: Right total hip arthroplasty, anterior approach.   IMPLANTS: DePuy Tri Lock stem, size 9, hi offset. DePuy Pinnacle Cup, size 60 mm. DePuy Altrx liner, size 36 by 60 mm, +4 neutral. DePuy metal head ball, size 36 + 1.5 mm.  ANESTHESIA:  General  ANTIBIOTICS: 2g ancef.  ESTIMATED BLOOD LOSS:-250 mL    DRAINS: None.  COMPLICATIONS: None   CONDITION: PACU - hemodynamically stable.   BRIEF CLINICAL NOTE: Rodney Love is a 75 y.o. male with a displaced Right femoral neck fracture. The patient was admitted to the hospitalist service and underwent perioperative risk stratification and medical optimization. The risks, benefits, and alternatives to total hip arthroplasty were explained, and the patient elected to proceed.  PROCEDURE IN DETAIL: The patient was taken to the operating room and spinal anesthesia was unsuccessfully attempted on the hospital bed.  General anesthesia was obtained. The patient was then positioned on the Hana table.  All bony prominences were well padded.  The hip was prepped and draped in the normal sterile surgical fashion.  A time-out was called verifying side and site of surgery. Antibiotics were given within 60 minutes of beginning the procedure.  The direct anterior approach to the hip was performed through the Hueter interval.  Lateral femoral circumflex vessels were treated with the Auqumantys. The anterior capsule was exposed and an inverted T capsulotomy was made.  Fracture hematoma was encountered and evacuated. The patient was found to have a comminuted Right subcapital femoral neck fracture.  I freshened the femoral neck cut with a saw.  I removed the femoral neck fragment.  A corkscrew was placed into the head and the head was  removed.  This was passed to the back table and was measured.   Acetabular exposure was achieved, and the pulvinar and labrum were excised. Sequential reaming of the acetabulum was then performed up to a size 59 mm reamer. A 60 mm cup was then opened and impacted into place at approximately 40 degrees of abduction and 20 degrees of anteversion. The final polyethylene liner was impacted into place.    I then gained femoral exposure taking care to protect the abductors and greater trochanter.  This was performed using standard external rotation, extension, and adduction.  The capsule was peeled off the inner aspect of the greater trochanter, taking care to preserve the short external rotators. A cookie cutter was used to enter the femoral canal, and then the femoral canal finder was used to confirm location.  I then sequentially broached up to a size 9.  Calcar planer was used on the femoral neck remnant.  I paced a hi neck and a trial head ball. The hip was reduced.  Leg lengths were checked fluoroscopically.  The hip was dislocated and trial components were removed.  I placed the real stem followed by the real spacer and head ball.  The hip was reduced.  Fluoroscopy was used to confirm component position and leg lengths.  At 90 degrees of external rotation and extension, the hip was stable to an anterior directed force.   The wound was copiously irrigated with Irrisept solution and normal saline using pule lavage.  Marcaine solution was injected into the periarticular soft tissue.  The wound was closed in layers using #1 Vicryl and V-Loc for the fascia, 2-0  Vicryl for the subcutaneous fat, 2-0 Monocryl for the deep dermal layer, and staples + glue for the skin.  Once the glue was fully dried, an Aquacell Ag dressing was applied.  The patient was then awakened from anesthesia and transported to the recovery room in stable condition.  Sponge, needle, and instrument counts were correct at the end of the case x2.   The patient tolerated the procedure well and there were no known complications.  Please note that a surgical assistant was a medical necessity for this procedure to perform it in a safe and expeditious manner. Assistant was necessary to provide appropriate retraction of vital neurovascular structures, to prevent femoral fracture, and to allow for anatomic placement of the prosthesis.

## 2020-04-29 NOTE — Transfer of Care (Signed)
Immediate Anesthesia Transfer of Care Note  Patient: Rodney Love  Procedure(s) Performed: TOTAL HIP ARTHROPLASTY ANTERIOR APPROACH (Right Hip)  Patient Location: PACU  Anesthesia Type:General  Level of Consciousness: drowsy  Airway & Oxygen Therapy: Patient Spontanous Breathing and Patient connected to face mask oxygen  Post-op Assessment: Report given to RN and Post -op Vital signs reviewed and stable  Post vital signs: Reviewed and stable  Last Vitals:  Vitals Value Taken Time  BP 137/68 1416  Temp 36.3 C 04/29/20 1415  Pulse 74   Resp    SpO2 100%     Last Pain:  Vitals:   04/29/20 1000  TempSrc: Rectal  PainSc:          Complications: No complications documented.

## 2020-04-29 NOTE — Anesthesia Procedure Notes (Signed)
Procedure Name: Intubation Date/Time: 04/29/2020 12:25 PM Performed by: Raenette Rover, CRNA Pre-anesthesia Checklist: Patient identified, Emergency Drugs available, Suction available and Patient being monitored Patient Re-evaluated:Patient Re-evaluated prior to induction Oxygen Delivery Method: Circle system utilized Preoxygenation: Pre-oxygenation with 100% oxygen Induction Type: IV induction Ventilation: Mask ventilation without difficulty Laryngoscope Size: Mac and 3 Grade View: Grade I Tube type: Oral Tube size: 7.5 mm Number of attempts: 1 Airway Equipment and Method: Stylet Placement Confirmation: ETT inserted through vocal cords under direct vision,  positive ETCO2 and breath sounds checked- equal and bilateral Secured at: 22 cm Tube secured with: Tape Dental Injury: Teeth and Oropharynx as per pre-operative assessment

## 2020-04-29 NOTE — ED Notes (Signed)
Pt refused all PRE SURGERY ENSURE

## 2020-04-29 NOTE — Progress Notes (Signed)
PROGRESS NOTE    Rodney Love  [id number] DOB: Dec 03, 1945 DOA: 04/28/2020 PCP: Lujean Amel, MD    Chief Complaint  Patient presents with  . Fall    Brief Narrative:   75 y.o.malewith medical history significant ofParkinson's disease, diabetes, coronary artery disease, hypertension and hyperlipidemia who apparently sustained a mechanical fall at home. Orthopedics consulted and he is scheduled for right hip arthroplasty  Assessment & Plan:   Principal Problem:   Femur fracture, right ([place]) Active Problems:   PAD (peripheral artery disease) s/p remote left external iliac artery angioplasty   Coronary atherosclerosis   Hypercholesterolemia   Essential hypertension   Diabetes mellitus type 2 in nonobese (HCC)   Carotid occlusion, right   PD (Parkinson's disease) ([place])   Right femur fracture: Orthopedics consulted and underwent right hip arthroplasty.  Pain control .  Therapy evaluations pending.   Parkinson's disease Continue with home medications.    Diabetes Mellitus:  CBG (last 3)  Recent Labs    04/29/20 1419  GLUCAP 120*   Resume SSI. HemoglobinA1c is pending.   Hyperlipidemia:  Resume statin.    Hypokalemia Replaced   Hypertension Blood pressure parameters appear to be optimal.    History of coronary artery disease Patient is on aspirin continue the same. Patient currently denies any chest pain.  DVT prophylaxis: (Lovenox) Code Status: (Full code) Family Communication: None at bedside.  Disposition:   Status is: Inpatient  Remains inpatient appropriate because:Ongoing diagnostic testing needed not appropriate for outpatient work up, IV treatments appropriate due to intensity of illness or inability to take PO and Inpatient level of care appropriate due to severity of illness   Dispo: The patient is from: Home              Anticipated d/c is to: pending.               Anticipated d/c date is: 2 days              Patient  currently is not medically stable to d/c.   Difficult to place patient No   Level of care: Med-Surg     Consultants:    orthopedics. Dr Lyla Glassing.   Procedures:  Right hip arthroplasty on 04/29/20  Antimicrobials:  Antibiotics Given (last 72 hours)    None          Subjective: Sleepy.   Objective: Vitals:   04/29/20 1500 04/29/20 1515 04/29/20 1530 04/29/20 1547  BP: 128/70 126/85 (!) 143/69 (!) 142/84  Pulse: (!) 56 81 77 77  Resp: 11 12 (!) 21 15  Temp:   (!) 97.4 F (36.3 C) 98 F (36.7 C)  TempSrc:    Oral  SpO2: 98% 96% 97% 94%  Weight:      Height:        Intake/Output Summary (Last 24 hours) at 04/29/2020 1617 Last data filed at 04/29/2020 1359 Gross per 24 hour  Intake 1950 ml  Output 1050 ml  Net 900 ml   Filed Weights   04/28/20 1513  Weight: 74.4 kg    Examination:  General exam: Appears calm and comfortable  Respiratory system: Clear to auscultation. Respiratory effort normal. Cardiovascular system: S1 & S2 heard, RRR. No JVD, No pedal edema. Gastrointestinal system: Abdomen is nondistended, soft and nontender.Normal bowel sounds heard. Central nervous system: Alert, able to answer simple questions. Extremities: Painful right lower extremity Skin: No rashes, lesions or ulcers Psychiatry:  Mood & affect appropriate.     Data  Reviewed: I have personally reviewed following labs and imaging studies  CBC: Recent Labs  Lab 04/28/20 1607 04/29/20 0425  WBC 8.1 6.7  NEUTROABS 6.1  --   HGB 14.3 12.6*  HCT 43.6 38.6*  MCV 97.3 98.0  PLT 162 356    Basic Metabolic Panel: Recent Labs  Lab 04/28/20 1607 04/29/20 0425  NA 139 142  K 3.2* 3.4*  CL 104 110  CO2 26 24  GLUCOSE 150* 102*  BUN 11 14  CREATININE 0.84 0.82  CALCIUM 9.0 8.6*    GFR: Estimated Creatinine Clearance: 80.7 mL/min (by C-G formula based on SCr of 0.82 mg/dL).  Liver Function Tests: Recent Labs  Lab 04/28/20 1607 04/29/20 0425  AST 16 14*  ALT 12  17  ALKPHOS 84 70  BILITOT 1.8* 1.6*  PROT 7.3 6.1*  ALBUMIN 3.8 3.1*    CBG: Recent Labs  Lab 04/29/20 1419  GLUCAP 120*     Recent Results (from the past 240 hour(s))  SARS Coronavirus 2 by RT PCR (hospital order, performed in [facility] hospital lab) Nasopharyngeal Nasopharyngeal Swab     Status: None   Collection Time: 04/28/20  4:07 PM   Specimen: Nasopharyngeal Swab  Result Value Ref Range Status   SARS Coronavirus 2 NEGATIVE NEGATIVE Final    Comment: (NOTE) SARS-CoV-2 target nucleic acids are NOT DETECTED.  The SARS-CoV-2 RNA is generally detectable in upper and lower respiratory specimens during the acute phase of infection. The lowest concentration of SARS-CoV-2 viral copies this assay can detect is 250 copies / mL. A negative result does not preclude SARS-CoV-2 infection and should not be used as the sole basis for treatment or other patient management decisions.  A negative result may occur with improper specimen collection / handling, submission of specimen other than nasopharyngeal swab, presence of viral mutation(s) within the areas targeted by this assay, and inadequate number of viral copies (<250 copies / mL). A negative result must be combined with clinical observations, patient history, and epidemiological information.  Fact Sheet for Patients:   StrictlyIdeas.no  Fact Sheet for Healthcare Providers: BankingDealers.co.za  This test is not yet approved or  cleared by the [place] FDA and has been authorized for detection and/or diagnosis of SARS-CoV-2 by FDA under an Emergency Use Authorization (EUA).  This EUA will remain in effect (meaning this test can be used) for the duration of the COVID-19 declaration under Section 564(b)(1) of the Act, 21 U.S.C. section 360bbb-3(b)(1), unless the authorization is terminated or revoked sooner.  Performed at [facility], [place] [address] [place], [place] [address]          Radiology Studies: DG Chest 1 View  Result Date: 04/28/2020 CLINICAL DATA:  Preop EXAM: CHEST  1 VIEW COMPARISON:  09/03/2019 FINDINGS: Loop recorder device projects over the left chest. Heart and mediastinal contours are within normal limits. No focal opacities or effusions. No acute bony abnormality. IMPRESSION: No active disease. Electronically Signed   By: Rolm Baptise M.D.   On: 04/28/2020 18:21   CT Head Wo Contrast  Result Date: 04/28/2020 CLINICAL DATA:  Mechanical fall 3 days ago, head trauma. EXAM: CT HEAD WITHOUT CONTRAST TECHNIQUE: Contiguous axial images were obtained from the base of the skull through the vertex without intravenous contrast. COMPARISON:  None. FINDINGS: Brain: The brainstem, cerebellum, cerebral peduncles, thalami, basal ganglia, basilar cisterns, and ventricular system appear within normal limits. Periventricular white matter and corona radiata hypodensities favor chronic ischemic microvascular white  matter disease. No intracranial hemorrhage, mass lesion, or acute CVA. Vascular: There is atherosclerotic calcification of the cavernous carotid arteries bilaterally. Skull: Unremarkable Sinuses/Orbits: Chronic bilateral maxillary sinusitis with a superimposed air-level in the left maxillary sinus compatible with acute left maxillary sinusitis. Other: No supplemental non-categorized findings. IMPRESSION: 1. No acute intracranial findings. 2. Periventricular white matter and corona radiata hypodensities favor chronic ischemic microvascular white matter disease. 3. Chronic bilateral maxillary sinusitis with a superimposed air-level in the left maxillary sinus compatible with acute left maxillary sinusitis. Electronically Signed   By: Van Clines M.D.   On: 04/28/2020 16:21   CT Cervical Spine Wo Contrast  Result Date: 04/28/2020 CLINICAL DATA:  Fall 3 days ago, neck trauma. EXAM: CT CERVICAL SPINE WITHOUT CONTRAST TECHNIQUE:  Multidetector CT imaging of the cervical spine was performed without intravenous contrast. Multiplanar CT image reconstructions were also generated. COMPARISON:  CT cervical spine 11/11/2019 FINDINGS: Alignment: Mild reversal of the normal cervical lordosis. No subluxation identified. Skull base and vertebrae: Spurring at the anterior C1-2 articulation. No cervical spine fracture or acute subluxation. No acute bony findings are observed. Soft tissues and spinal canal: Bilateral common carotid atherosclerotic calcification. Disc levels: Multilevel uncinate and facet spurring potentially with mild right foraminal impingement at C3-4. Loss of intervertebral disc height at multiple levels, most strikingly at C5-6 and C6-7. Upper chest: Biapical pleuroparenchymal scarring. Suspected emphysema. Other: No supplemental non-categorized findings. IMPRESSION: 1. No acute cervical spine findings. 2. Cervical spondylosis and degenerative disc disease. 3. Bilateral common carotid atherosclerotic calcification. 4. Suspected emphysema. Emphysema (ICD10-J43.9). Electronically Signed   By: Van Clines M.D.   On: 04/28/2020 16:26   Pelvis Portable  Result Date: 04/29/2020 CLINICAL DATA:  Status post right hip arthroplasty. EXAM: PORTABLE PELVIS 1-2 VIEWS COMPARISON:  09/03/2019 and right hip C-arm images obtained earlier today. FINDINGS: Right hip bipolar prosthesis in satisfactory position and alignment. No fracture or dislocation seen. Atheromatous arterial calcifications. Right pelvic surgical clips. Diffuse osteopenia. IMPRESSION: Satisfactory appearance of a right hip bipolar prosthesis. Electronically Signed   By: Claudie Revering M.D.   On: 04/29/2020 14:59   DG Knee Complete 4 Views Right  Result Date: 04/28/2020 CLINICAL DATA:  Fall, right hip fracture EXAM: RIGHT KNEE - COMPLETE 4+ VIEW COMPARISON:  None. FINDINGS: No evidence of fracture, dislocation, or joint effusion. No evidence of arthropathy or other focal  bone abnormality. Soft tissues are unremarkable. IMPRESSION: Negative. Electronically Signed   By: Rolm Baptise M.D.   On: 04/28/2020 18:21   DG Foot Complete Right  Result Date: 04/28/2020 CLINICAL DATA:  Right foot pain.  Fall 3 days ago. EXAM: RIGHT FOOT COMPLETE - 3+ VIEW COMPARISON:  None. FINDINGS: No convincing fracture.  No bone lesion. Joints are normally aligned. Small dorsal and plantar calcaneal spurs. Soft tissues are unremarkable. IMPRESSION: No fracture or dislocation. Electronically Signed   By: Lajean Manes M.D.   On: 04/28/2020 16:02   DG C-Arm 1-60 Min-No Report  Result Date: 04/29/2020 Fluoroscopy was utilized by the requesting physician.  No radiographic interpretation.   DG HIP OPERATIVE UNILAT W OR W/O PELVIS RIGHT  Result Date: 04/29/2020 CLINICAL DATA:  75 year old male with a history of right hip arthroplasty EXAM: OPERATIVE RIGHT HIP (WITH PELVIS IF PERFORMED) 2 VIEWS TECHNIQUE: Fluoroscopic spot image(s) were submitted for interpretation post-operatively. COMPARISON:  04/28/2020 FINDINGS: Limited intraoperative fluoroscopic spot images of the right hip demonstrating right hip arthroplasty for fracture fixation. Components are aligned. IMPRESSION: Limited intraoperative fluoroscopic spot images of right hip  arthroplasty. Please refer to the dictated operative report for full details of intraoperative findings and procedure. Electronically Signed   By: Corrie Mckusick D.O.   On: 04/29/2020 14:00   DG Hip Unilat With Pelvis 2-3 Views Right  Result Date: 04/28/2020 CLINICAL DATA:  Right hip pain after fall 3 days ago. EXAM: DG HIP (WITH OR WITHOUT PELVIS) 2-3V RIGHT COMPARISON:  None. FINDINGS: Displaced right femoral neck fracture with proximal migration of the femoral shaft. No significant angulation. The femoral head remains seated. Mild acetabular spurring. Pubic rami are intact. Bones are diffusely under mineralized. Soft tissue edema is noted laterally. There are vascular  calcifications. IMPRESSION: Displaced right femoral neck fracture. Electronically Signed   By: Keith Rake M.D.   On: 04/28/2020 16:03        Scheduled Meds: . aspirin EC  81 mg Oral Daily  . atorvastatin  20 mg Oral Daily  . Chlorhexidine Gluconate Cloth  6 each Topical Daily  . cholecalciferol  1,000 Units Oral Daily  . docusate sodium  100 mg Oral BID  . [START ON 04/30/2020] enoxaparin (LOVENOX) injection  40 mg Subcutaneous Q24H  . influenza vaccine adjuvanted  0.5 mL Intramuscular Tomorrow-1000  . insulin aspart  0-5 Units Subcutaneous QHS  . insulin aspart  0-9 Units Subcutaneous TID WC  . [START ON 05/01/2020] metFORMIN  850 mg Oral Once per day on Mon Wed Fri  . QUEtiapine  12.5 mg Oral QHS  . senna  1 tablet Oral BID   Continuous Infusions: . 0.9 % NaCl with KCl 20 mEq / L 75 mL/hr at 04/28/20 2305  . ceFAZolin    .  ceFAZolin (ANCEF) IV       LOS: 1 day        Hosie Poisson, MD [facility]   To contact the attending provider between 7A-7P or the covering provider during after hours 7P-7A, please log into the web site [email or web address] and access using universal [place] password for that web site. If you do not have the password, please call the hospital operator.  04/29/2020, 4:17 PM

## 2020-04-30 DIAGNOSIS — S728X1D Other fracture of right femur, subsequent encounter for closed fracture with routine healing: Secondary | ICD-10-CM | POA: Diagnosis not present

## 2020-04-30 LAB — CBC
HCT: 32.8 % — ABNORMAL LOW (ref 39.0–52.0)
Hemoglobin: 10.9 g/dL — ABNORMAL LOW (ref 13.0–17.0)
MCH: 31.9 pg (ref 26.0–34.0)
MCHC: 33.2 g/dL (ref 30.0–36.0)
MCV: 95.9 fL (ref 80.0–100.0)
Platelets: 150 10*3/uL (ref 150–400)
RBC: 3.42 MIL/uL — ABNORMAL LOW (ref 4.22–5.81)
RDW: 12.9 % (ref 11.5–15.5)
WBC: 9.9 10*3/uL (ref 4.0–10.5)
nRBC: 0 % (ref 0.0–0.2)

## 2020-04-30 LAB — BASIC METABOLIC PANEL
Anion gap: 10 (ref 5–15)
BUN: 19 mg/dL (ref 8–23)
CO2: 23 mmol/L (ref 22–32)
Calcium: 8.1 mg/dL — ABNORMAL LOW (ref 8.9–10.3)
Chloride: 105 mmol/L (ref 98–111)
Creatinine, Ser: 0.89 mg/dL (ref 0.61–1.24)
GFR, Estimated: >60 mL/min (ref >60)
Glucose, Bld: 120 mg/dL — ABNORMAL HIGH (ref 70–99)
Potassium: 3.4 mmol/L — ABNORMAL LOW (ref 3.5–5.1)
Sodium: 138 mmol/L (ref 135–145)

## 2020-04-30 LAB — GLUCOSE, CAPILLARY
Glucose-Capillary: 112 mg/dL — ABNORMAL HIGH (ref 70–99)
Glucose-Capillary: 196 mg/dL — ABNORMAL HIGH (ref 70–99)
Glucose-Capillary: 174 mg/dL — ABNORMAL HIGH (ref 70–99)
Glucose-Capillary: 180 mg/dL — ABNORMAL HIGH (ref 70–99)

## 2020-04-30 MED ORDER — POTASSIUM CHLORIDE CRYS ER 20 MEQ PO TBCR
40.0000 meq | EXTENDED_RELEASE_TABLET | Freq: Once | ORAL | Status: AC
  Administered 2020-04-30: 40 meq via ORAL
  Filled 2020-04-30: qty 2

## 2020-04-30 NOTE — Anesthesia Postprocedure Evaluation (Signed)
Anesthesia Post Note  Patient: Rodney Love  Procedure(s) Performed: TOTAL HIP ARTHROPLASTY ANTERIOR APPROACH (Right Hip)     Patient location during evaluation: PACU Anesthesia Type: General Level of consciousness: awake and alert Pain management: pain level controlled Vital Signs Assessment: post-procedure vital signs reviewed and stable Respiratory status: spontaneous breathing, nonlabored ventilation, respiratory function stable and patient connected to nasal cannula oxygen Cardiovascular status: blood pressure returned to baseline and stable Postop Assessment: no apparent nausea or vomiting Anesthetic complications: no   No complications documented.  Last Vitals:  Vitals:   04/30/20 1345 04/30/20 2045  BP: 127/63 136/80  Pulse: 77 99  Resp: 16 18  Temp: 36.4 C (!) 36.3 C  SpO2: 100% 96%    Last Pain:  Vitals:   04/30/20 2045  TempSrc: Oral  PainSc:                  Barnet Glasgow

## 2020-04-30 NOTE — Evaluation (Signed)
Physical Therapy Evaluation Patient Details Name: Rodney Love MRN: [id number] DOB: [birth year] Today's Date: 04/30/2020   History of Present Illness  75 y.o. male with medical history significant of Parkinson's disease, diabetes, coronary artery disease, hypertension and hyperlipidemia who apparently sustained a mechanical fall at home. Now s/p Right total hip arthroplasty, direct anterior approach.  Clinical Impression  On eval, pt required Max assist +2 for mobility. He was able to stand x 1 with RW, then x 2 with use of STEDY. Used STEDY to transfer pt to recliner on today. Pt presents with general weakness, decreased activity tolerance, and impaired gait and balance. Noted some rigidity/stiffness (primarily in hips) with movement of bil LEs. Heavy posterior bias with attempt to stand with RW. This lessened quite a bit with use of STEDY. Moderate pain with activity this session. Wife was present during session. Discussed d/c plan-she is agreeable to recommendation for rehab. She would like to discuss options with TOC team. At this time, I have made a recommendation for CIR consult. If he is not deemed a candidate, pt will need SNF.     Follow Up Recommendations CIR (SNF if CIR not an option)    Equipment Recommendations  None recommended by PT    Recommendations for Other Services       Precautions / Restrictions Precautions Precautions: Fall Precaution Comments: direct anterior no restrictions Restrictions Weight Bearing Restrictions: No RLE Weight Bearing: Weight bearing as tolerated      Mobility  Bed Mobility Overal bed mobility: Needs Assistance Bed Mobility: Supine to Sit     Supine to sit: +2 for physical assistance;+2 for safety/equipment;HOB elevated;Max assist Sit to supine: Total assist;+2 for physical assistance;+2 for safety/equipment (helicopter technique used)   General bed mobility comments: Increased time. Multimodal cueing required. Assist for trunk and bil  LEs. Utilized bedpad to assist with scooting, positioning.    Transfers Overall transfer level: Needs assistance Equipment used: Rolling walker (2 wheeled) Transfers: Sit to/from Stand Sit to Stand: Mod assist;+2 physical assistance;+2 safety/equipment;From elevated surface         General transfer comment: Sit to stand x 3 (once with RW, twice with STEDY). Mod A +2 to stand with RW. Min A +2 to stand with STEDY. Multimodal cueing required for safety, technique, feet placement, anterior weightshifting. Required high bed surface. Heavy posterior bias during sit to stand. Used STEDY to transfer to recliner.  Ambulation/Gait             General Gait Details: NT-unable to safely attempt on today 2* poor balance/high fall risk  Stairs            Wheelchair Mobility    Modified Rankin (Stroke Patients Only)       Balance Overall balance assessment: Needs assistance;History of Falls Sitting-balance support: Feet supported;Single extremity supported Sitting balance-Leahy Scale: Fair Sitting balance - Comments: fair balance. able to work on anterior weightshifting by sliding hands down legs x 4 reps Postural control: Posterior lean Standing balance support: Bilateral upper extremity supported Standing balance-Leahy Scale: Poor Standing balance comment: dependent on therapist support                             Pertinent Vitals/Pain Pain Assessment: Faces Pain Score: 3  Faces Pain Scale: Hurts even more Pain Location: R hip Pain Descriptors / Indicators: Discomfort;Grimacing;Sore;Sharp Pain Intervention(s): Limited activity within patient's tolerance;Monitored during session;Repositioned;Ice applied    Home Living Family/patient expects to  be discharged to:: Private residence Living Arrangements: Spouse/significant other Available Help at Discharge: Family;Available PRN/intermittently;Personal care attendant (wife 24/7, adult children/grandchildren PRN,  CNA Sunday and Wednesday nights) Type of Home: House Home Access: Level entry (door threshold only)     Home Layout: One level Home Equipment: Walker - 2 wheels;Shower seat - built in;Grab bars - toilet;Grab bars - tub/shower;Hand held shower head Additional Comments: been falling frequently within the last 3 weeks, involved with community Palliative Care, Wife says that he sleeps 80% of the time    Prior Function Level of Independence: Needs assistance   Gait / Transfers Assistance Needed: RW for mobility/ambulation - and when he doesn't is prone to fall  ADL's / Homemaking Assistance Needed: Wife does bathing/dressing. Pt self-feeds and requires min A with grooming  Comments: just "finished" community PT/OT but was still working with SLP     Hand Dominance        Extremity/Trunk Assessment   Upper Extremity Assessment Upper Extremity Assessment: Defer to OT evaluation    Lower Extremity Assessment Lower Extremity Assessment: Generalized weakness RLE Deficits / Details: some rigidity noted RLE Coordination: decreased fine motor;decreased gross motor    Cervical / Trunk Assessment Cervical / Trunk Assessment: Normal  Communication   Communication: HOH  Cognition Arousal/Alertness: Awake/alert Behavior During Therapy: WFL for tasks assessed/performed Overall Cognitive Status: History of cognitive impairments - at baseline                                 General Comments: very sweet and cooperative, aware of wife, answered questions appropriately, follows commands well      General Comments General comments (skin integrity, edema, etc.): Pt's wife says that he had been working with OT/PT and just "discharged" but was still working with SLP. She also reports that he has been working with Palliative services in the community    Exercises Long Arc Quads, active, 10 reps, 2 sets, seated in recliner   Assessment/Plan    PT Assessment Patient needs  continued PT services  PT Problem List Decreased strength;Decreased mobility;Decreased range of motion;Decreased activity tolerance;Decreased balance;Pain       PT Treatment Interventions DME instruction;Gait training;Therapeutic activities;Therapeutic exercise;Patient/family education;Balance training;Functional mobility training    PT Goals (Current goals can be found in the Care Plan section)  Acute Rehab PT Goals Patient Stated Goal: to regain PLOF PT Goal Formulation: With patient/family Time For Goal Achievement: 05/14/20 Potential to Achieve Goals: Good    Frequency Min 3X/week   Barriers to discharge Decreased caregiver support wife unable to provide current level of assistance    Co-evaluation               AM-PAC PT "6 Clicks" Mobility  Outcome Measure Help needed turning from your back to your side while in a flat bed without using bedrails?: A Lot Help needed moving from lying on your back to sitting on the side of a flat bed without using bedrails?: A Lot Help needed moving to and from a bed to a chair (including a wheelchair)?: Total Help needed standing up from a chair using your arms (e.g., wheelchair or bedside chair)?: Total Help needed to walk in hospital room?: Total Help needed climbing 3-5 steps with a railing? : Total 6 Click Score: 8    End of Session Equipment Utilized During Treatment: Gait belt Activity Tolerance: Patient tolerated treatment well Patient left: in chair;with call bell/phone within  reach;with chair alarm set;with family/visitor present Nurse Communication: Need for lift equipment (recommended RN try STEDY 1st, then hoyer if unable to rise from recliner) PT Visit Diagnosis: Muscle weakness (generalized) (M62.81);History of falling (Z91.81);Pain;Other abnormalities of gait and mobility (R26.89) Pain - Right/Left: Right Pain - part of body: Hip    Time: 1101-1131 PT Time Calculation (min) (ACUTE ONLY): 30 min   Charges:   PT  Evaluation $PT Eval Moderate Complexity: 1 Mod PT Treatments $Therapeutic Activity: 8-22 mins           Doreatha Massed, PT Acute Rehabilitation  Office: [phone] Pager: [phone]

## 2020-04-30 NOTE — Progress Notes (Signed)
     Subjective: 1 Day Post-Op Procedure(s) (LRB): TOTAL HIP ARTHROPLASTY ANTERIOR APPROACH (Right)   Patient reports pain as mild, pain controlled.  No reported events throughout the night.  Discussed the procedure and expectations moving forward.  Discharged TBD with medicine and progress in the hospital.       Objective:   VITALS:   Vitals:   04/30/20 0551 04/30/20 1020  BP: 117/62 (!) 103/57  Pulse: 78 64  Resp: 18 18  Temp: 98.1 F (36.7 C) 98.6 F (37 C)  SpO2: 94% 100%    Dorsiflexion/Plantar flexion intact Incision: dressing C/D/I No cellulitis present Compartment soft  LABS Recent Labs    04/28/20 1607 04/29/20 0425 04/30/20 0550  HGB 14.3 12.6* 10.9*  HCT 43.6 38.6* 32.8*  WBC 8.1 6.7 9.9  PLT 162 152 150    Recent Labs    04/28/20 1607 04/29/20 0425 04/30/20 0550  NA 139 142 138  K 3.2* 3.4* 3.4*  BUN 11 14 19   CREATININE 0.84 0.82 0.89  GLUCOSE 150* 102* 120*     Assessment/Plan: 1 Day Post-Op Procedure(s) (LRB): TOTAL HIP ARTHROPLASTY ANTERIOR APPROACH (Right)  WBAT right LE Advance diet Up with therapy Discharge disposition TBD        Danae Orleans PA-C  [facility]  [facility] [address], [address], [place], [place] [address] Phone: [phone] [email or web address] Facebook  [employer]

## 2020-04-30 NOTE — Progress Notes (Signed)
Inpatient Rehab Admissions Coordinator Note:   Per therapy recommendations, pt was screened for CIR candidacy by Clemens Catholic, [place] CCC-SLP. At this time, Pt. Appears to have functional decline but does not demonstrate medical necessity for CIR admit. 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 Pt. Would likely benefit from rehab at a lower level of care   Please contact me with questions.    Clemens Catholic, [place], [place] Admissions Coordinator  [phone] ([place]) [phone] (office)

## 2020-04-30 NOTE — Progress Notes (Signed)
PROGRESS NOTE    Rodney Love  WUJ:[id number] DOB: 1946-02-17 DOA: 04/28/2020 PCP: Lujean Amel, MD    Chief Complaint  Patient presents with  . Fall    Brief Narrative:   75 y.o.malewith medical history significant ofParkinson's disease, diabetes, coronary artery disease, hypertension and hyperlipidemia who apparently sustained a mechanical fall at home. Orthopedics consulted and he underwent right hip arthroplasty. Pt seen and examined . He appears comfortable.    Assessment & Plan:   Principal Problem:   Femur fracture, right (HCC) Active Problems:   PAD (peripheral artery disease) s/p remote left external iliac artery angioplasty   Coronary atherosclerosis   Hypercholesterolemia   Essential hypertension   Diabetes mellitus type 2 in nonobese (HCC)   Carotid occlusion, right   PD (Parkinson's disease) ([place])   Right femur fracture: Orthopedics consulted and underwent right hip arthroplasty.  Pain control .  Therapy evaluations recommending SNF versus CIR  Parkinson's disease Continue with carbidopa, levodopa No change in medications.    Diabetes Mellitus:  CBG (last 3)  Recent Labs    04/29/20 2213 04/30/20 0756 04/30/20 1141  GLUCAP 177* 112* 196*   Resume SSI. HemoglobinA1c orderd.  No change in meds.  Continue with Metformin.   Constipation On senna  Hyperlipidemia:  Resume statin.    Hypokalemia Replaced repeat potassium in the morning   Hypertension Blood pressure parameters are optimal    History of coronary artery disease Patient is on aspirin continue the same. Patient currently denies any chest pain.   Anemia of blood loss probably from the procedure Hemoglobin around 10.9. Recheck CBC in the morning  DVT prophylaxis: (Lovenox) Code Status: (Full code) Family Communication: None at bedside.  Disposition:   Status is: Inpatient  Remains inpatient appropriate because:Ongoing diagnostic testing needed not  appropriate for outpatient work up, IV treatments appropriate due to intensity of illness or inability to take PO and Inpatient level of care appropriate due to severity of illness   Dispo: The patient is from: Home              Anticipated d/c is to: pending.               Anticipated d/c date is: 2 days              Patient currently is not medically stable to d/c.   Difficult to place patient No   Level of care: Med-Surg     Consultants:    orthopedics. Dr Lyla Glassing.   Procedures:  Right hip arthroplasty on 04/29/20  Antimicrobials:  Antibiotics Given (last 72 hours)    Date/Time Action Medication Dose Rate   04/29/20 1925 New Bag/Given   ceFAZolin (ANCEF) IVPB 2g/100 mL premix 2 g 200 mL/hr   04/30/20 0110 New Bag/Given   ceFAZolin (ANCEF) IVPB 2g/100 mL premix 2 g 200 mL/hr         Subjective: Sleepy.   Objective: Vitals:   04/30/20 0116 04/30/20 0551 04/30/20 1020 04/30/20 1345  BP: 124/62 117/62 (!) 103/57 127/63  Pulse: 78 78 64 77  Resp: 17 18 18 16   Temp: (!) 97.3 F (36.3 C) 98.1 F (36.7 C) 98.6 F (37 C) 97.6 F (36.4 C)  TempSrc: Oral Oral Oral Oral  SpO2: 97% 94% 100% 100%  Weight:      Height:        Intake/Output Summary (Last 24 hours) at 04/30/2020 1528 Last data filed at 04/30/2020 1500 Gross per 24 hour  Intake 3051.21  ml  Output 775 ml  Net 2276.21 ml   Filed Weights   04/28/20 1513  Weight: 74.4 kg    Examination:  General exam: Elderly gentleman appears comfortable Respiratory system: Clear to auscultation, no wheezing or rhonchi Cardiovascular system: S1-S2 heard, regular rate rhythm, no pedal edema. Gastrointestinal system: Abdomen is soft nontender bowel sounds normal Central nervous system: pt confused.  Extremities: Painful right lower extremity movements Skin: No rashes seen Psychiatry: Cannot be assessed    Data Reviewed: I have personally reviewed following labs and imaging studies  CBC: Recent Labs  Lab  04/28/20 1607 04/29/20 0425 04/30/20 0550  WBC 8.1 6.7 9.9  NEUTROABS 6.1  --   --   HGB 14.3 12.6* 10.9*  HCT 43.6 38.6* 32.8*  MCV 97.3 98.0 95.9  PLT 162 152 [id number]    Basic Metabolic Panel: Recent Labs  Lab 04/28/20 1607 04/29/20 0425 04/30/20 0550  NA 139 142 138  K 3.2* 3.4* 3.4*  CL 104 110 105  CO2 26 24 23   GLUCOSE 150* 102* 120*  BUN 11 14 19   CREATININE 0.84 0.82 0.89  CALCIUM 9.0 8.6* 8.1*    GFR: Estimated Creatinine Clearance: 74.3 mL/min (by C-G formula based on SCr of 0.89 mg/dL).  Liver Function Tests: Recent Labs  Lab 04/28/20 1607 04/29/20 0425  AST 16 14*  ALT 12 17  ALKPHOS 84 70  BILITOT 1.8* 1.6*  PROT 7.3 6.1*  ALBUMIN 3.8 3.1*    CBG: Recent Labs  Lab 04/29/20 1419 04/29/20 1627 04/29/20 2213 04/30/20 0756 04/30/20 1141  GLUCAP 120* 157* 177* 112* 196*     Recent Results (from the past 240 hour(s))  SARS Coronavirus 2 by RT PCR (hospital order, performed in [facility] hospital lab) Nasopharyngeal Nasopharyngeal Swab     Status: None   Collection Time: 04/28/20  4:07 PM   Specimen: Nasopharyngeal Swab  Result Value Ref Range Status   SARS Coronavirus 2 NEGATIVE NEGATIVE Final    Comment: (NOTE) SARS-CoV-2 target nucleic acids are NOT DETECTED.  The SARS-CoV-2 RNA is generally detectable in upper and lower respiratory specimens during the acute phase of infection. The lowest concentration of SARS-CoV-2 viral copies this assay can detect is 250 copies / mL. A negative result does not preclude SARS-CoV-2 infection and should not be used as the sole basis for treatment or other patient management decisions.  A negative result may occur with improper specimen collection / handling, submission of specimen other than nasopharyngeal swab, presence of viral mutation(s) within the areas targeted by this assay, and inadequate number of viral copies (<250 copies / mL). A negative result must be combined with clinical observations,  patient history, and epidemiological information.  Fact Sheet for Patients:   StrictlyIdeas.no  Fact Sheet for Healthcare Providers: BankingDealers.co.za  This test is not yet approved or  cleared by the [place] FDA and has been authorized for detection and/or diagnosis of SARS-CoV-2 by FDA under an Emergency Use Authorization (EUA).  This EUA will remain in effect (meaning this test can be used) for the duration of the COVID-19 declaration under Section 564(b)(1) of the Act, 21 U.S.C. section 360bbb-3(b)(1), unless the authorization is terminated or revoked sooner.  Performed at [facility], [place] [address], [place], [place] [address]          Radiology Studies: DG Chest 1 View  Result Date: 04/28/2020 CLINICAL DATA:  Preop EXAM: CHEST  1 VIEW COMPARISON:  09/03/2019 FINDINGS: Loop recorder device projects  over the left chest. Heart and mediastinal contours are within normal limits. No focal opacities or effusions. No acute bony abnormality. IMPRESSION: No active disease. Electronically Signed   By: Rolm Baptise M.D.   On: 04/28/2020 18:21   CT Head Wo Contrast  Result Date: 04/28/2020 CLINICAL DATA:  Mechanical fall 3 days ago, head trauma. EXAM: CT HEAD WITHOUT CONTRAST TECHNIQUE: Contiguous axial images were obtained from the base of the skull through the vertex without intravenous contrast. COMPARISON:  None. FINDINGS: Brain: The brainstem, cerebellum, cerebral peduncles, thalami, basal ganglia, basilar cisterns, and ventricular system appear within normal limits. Periventricular white matter and corona radiata hypodensities favor chronic ischemic microvascular white matter disease. No intracranial hemorrhage, mass lesion, or acute CVA. Vascular: There is atherosclerotic calcification of the cavernous carotid arteries bilaterally. Skull: Unremarkable Sinuses/Orbits: Chronic bilateral maxillary sinusitis with a  superimposed air-level in the left maxillary sinus compatible with acute left maxillary sinusitis. Other: No supplemental non-categorized findings. IMPRESSION: 1. No acute intracranial findings. 2. Periventricular white matter and corona radiata hypodensities favor chronic ischemic microvascular white matter disease. 3. Chronic bilateral maxillary sinusitis with a superimposed air-level in the left maxillary sinus compatible with acute left maxillary sinusitis. Electronically Signed   By: Van Clines M.D.   On: 04/28/2020 16:21   CT Cervical Spine Wo Contrast  Result Date: 04/28/2020 CLINICAL DATA:  Fall 3 days ago, neck trauma. EXAM: CT CERVICAL SPINE WITHOUT CONTRAST TECHNIQUE: Multidetector CT imaging of the cervical spine was performed without intravenous contrast. Multiplanar CT image reconstructions were also generated. COMPARISON:  CT cervical spine 11/11/2019 FINDINGS: Alignment: Mild reversal of the normal cervical lordosis. No subluxation identified. Skull base and vertebrae: Spurring at the anterior C1-2 articulation. No cervical spine fracture or acute subluxation. No acute bony findings are observed. Soft tissues and spinal canal: Bilateral common carotid atherosclerotic calcification. Disc levels: Multilevel uncinate and facet spurring potentially with mild right foraminal impingement at C3-4. Loss of intervertebral disc height at multiple levels, most strikingly at C5-6 and C6-7. Upper chest: Biapical pleuroparenchymal scarring. Suspected emphysema. Other: No supplemental non-categorized findings. IMPRESSION: 1. No acute cervical spine findings. 2. Cervical spondylosis and degenerative disc disease. 3. Bilateral common carotid atherosclerotic calcification. 4. Suspected emphysema. Emphysema (ICD10-J43.9). Electronically Signed   By: Van Clines M.D.   On: 04/28/2020 16:26   Pelvis Portable  Result Date: 04/29/2020 CLINICAL DATA:  Status post right hip arthroplasty. EXAM: PORTABLE  PELVIS 1-2 VIEWS COMPARISON:  09/03/2019 and right hip C-arm images obtained earlier today. FINDINGS: Right hip bipolar prosthesis in satisfactory position and alignment. No fracture or dislocation seen. Atheromatous arterial calcifications. Right pelvic surgical clips. Diffuse osteopenia. IMPRESSION: Satisfactory appearance of a right hip bipolar prosthesis. Electronically Signed   By: Claudie Revering M.D.   On: 04/29/2020 14:59   DG Knee Complete 4 Views Right  Result Date: 04/28/2020 CLINICAL DATA:  Fall, right hip fracture EXAM: RIGHT KNEE - COMPLETE 4+ VIEW COMPARISON:  None. FINDINGS: No evidence of fracture, dislocation, or joint effusion. No evidence of arthropathy or other focal bone abnormality. Soft tissues are unremarkable. IMPRESSION: Negative. Electronically Signed   By: Rolm Baptise M.D.   On: 04/28/2020 18:21   DG Foot Complete Right  Result Date: 04/28/2020 CLINICAL DATA:  Right foot pain.  Fall 3 days ago. EXAM: RIGHT FOOT COMPLETE - 3+ VIEW COMPARISON:  None. FINDINGS: No convincing fracture.  No bone lesion. Joints are normally aligned. Small dorsal and plantar calcaneal spurs. Soft tissues are unremarkable. IMPRESSION: No fracture  or dislocation. Electronically Signed   By: Lajean Manes M.D.   On: 04/28/2020 16:02   DG C-Arm 1-60 Min-No Report  Result Date: 04/29/2020 Fluoroscopy was utilized by the requesting physician.  No radiographic interpretation.   DG HIP OPERATIVE UNILAT W OR W/O PELVIS RIGHT  Result Date: 04/29/2020 CLINICAL DATA:  75 year old male with a history of right hip arthroplasty EXAM: OPERATIVE RIGHT HIP (WITH PELVIS IF PERFORMED) 2 VIEWS TECHNIQUE: Fluoroscopic spot image(s) were submitted for interpretation post-operatively. COMPARISON:  04/28/2020 FINDINGS: Limited intraoperative fluoroscopic spot images of the right hip demonstrating right hip arthroplasty for fracture fixation. Components are aligned. IMPRESSION: Limited intraoperative fluoroscopic spot  images of right hip arthroplasty. Please refer to the dictated operative report for full details of intraoperative findings and procedure. Electronically Signed   By: Corrie Mckusick D.O.   On: 04/29/2020 14:00   DG Hip Unilat With Pelvis 2-3 Views Right  Result Date: 04/28/2020 CLINICAL DATA:  Right hip pain after fall 3 days ago. EXAM: DG HIP (WITH OR WITHOUT PELVIS) 2-3V RIGHT COMPARISON:  None. FINDINGS: Displaced right femoral neck fracture with proximal migration of the femoral shaft. No significant angulation. The femoral head remains seated. Mild acetabular spurring. Pubic rami are intact. Bones are diffusely under mineralized. Soft tissue edema is noted laterally. There are vascular calcifications. IMPRESSION: Displaced right femoral neck fracture. Electronically Signed   By: Keith Rake M.D.   On: 04/28/2020 16:03        Scheduled Meds: . aspirin EC  81 mg Oral Daily  . atorvastatin  20 mg Oral Daily  . carbidopa-levodopa  1 tablet Oral QHS  . Carbidopa-Levodopa ER  1 tablet Oral QID  . Chlorhexidine Gluconate Cloth  6 each Topical Daily  . cholecalciferol  1,000 Units Oral Daily  . docusate sodium  100 mg Oral BID  . enoxaparin (LOVENOX) injection  40 mg Subcutaneous Q24H  . influenza vaccine adjuvanted  0.5 mL Intramuscular Tomorrow-1000  . insulin aspart  0-5 Units Subcutaneous QHS  . insulin aspart  0-9 Units Subcutaneous TID WC  . [START ON 05/01/2020] metFORMIN  850 mg Oral Once per day on Mon Wed Fri  . QUEtiapine  12.5 mg Oral QHS  . senna  1 tablet Oral BID   Continuous Infusions: . 0.9 % NaCl with KCl 20 mEq / L 75 mL/hr at 04/30/20 1201     LOS: 2 days        Hosie Poisson, MD [facility]   To contact the attending provider between 7A-7P or the covering provider during after hours 7P-7A, please log into the web site [email or web address] and access using universal [place] password for that web site. If you do not have the password, please call the  hospital operator.  04/30/2020, 3:28 PM

## 2020-04-30 NOTE — Evaluation (Signed)
Occupational Therapy Evaluation Patient Details Name: Rodney Love MRN: [id number] DOB: 26-Jul-1945 Today's Date: 04/30/2020    History of Present Illness 75 y.o. male with medical history significant of Parkinson's disease, diabetes, coronary artery disease, hypertension and hyperlipidemia who apparently sustained a mechanical fall at home. Now s/p Right total hip arthroplasty, direct anterior approach.   Clinical Impression   Pt typically using RW for mobility with increasing falls the last month. Wife assists with bathing/dressing. She reports that Pt "Sleeps most of the day and night" at baseline. Today Pt arouses easily, able to perform bed level exercises with AAROM min A. Max A to come EOB, MAX A to come into standing, immediate incontinence of bowel. Returned supine for total A for peri care and bed linens change. Pt requires +2 assist for safety and physical assist. Pt will require continued OT services while in the acute setting and afterwards. Family prefers at home, but might require SNF if they cannot handle lift equipment.    Follow Up Recommendations  Home health OT;SNF;Supervision/Assistance - 24 hour (due to cognitive baseline of Parkinsons, it is the families wishes that he go home. If this happens they will need lift equipment. Wife is concerned about Pt going SNF and not being able to visit hime/be with him)    Equipment Recommendations  3 in 1 bedside commode;Wheelchair (measurements OT);Wheelchair cushion (measurements OT);Hospital bed;Other (comment) Firefighter)    Recommendations for Other Services Other (comment) (Palliative)     Precautions / Restrictions Precautions Precautions: Fall Precaution Comments: direct anterior no restrictions Restrictions Weight Bearing Restrictions: Yes RLE Weight Bearing: Weight bearing as tolerated      Mobility Bed Mobility Overal bed mobility: Needs Assistance Bed Mobility: Supine to Sit;Sit to Supine     Supine to  sit: Max assist (assist for BLE to EOB, trunk elevation and use of pad to bring hips EOB) Sit to supine: Total assist;+2 for physical assistance;+2 for safety/equipment (helicopter technique used)        Transfers Overall transfer level: Needs assistance Equipment used: Rolling walker (2 wheeled) Transfers: Sit to/from Stand Sit to Stand: Max assist;From elevated surface         General transfer comment: Pt pulling up on RW, strong posterior lean, able to come to full upright with max A. and strong use of gait belt    Balance Overall balance assessment: Needs assistance;History of Falls Sitting-balance support: Feet supported;Single extremity supported Sitting balance-Leahy Scale: Poor Sitting balance - Comments: able to sit brief moments of min guard EOB and holding onto bed rail, but mostly min to mod A to sit EOB Postural control: Posterior lean (strong) Standing balance support: Bilateral upper extremity supported Standing balance-Leahy Scale: Zero Standing balance comment: dependent on therapist support                           ADL either performed or assessed with clinical judgement   ADL Overall ADL's : Needs assistance/impaired Eating/Feeding: Minimal assistance   Grooming: Moderate assistance   Upper Body Bathing: Moderate assistance   Lower Body Bathing: Total assistance   Upper Body Dressing : Maximal assistance   Lower Body Dressing: Total assistance   Toilet Transfer: Maximal assistance;+2 for physical assistance;+2 for safety/equipment;Squat-pivot;BSC   Toileting- Clothing Manipulation and Hygiene: Total assistance;+2 for physical assistance;+2 for safety/equipment;Bed level       Functional mobility during ADLs: Maximal assistance;+2 for physical assistance;+2 for safety/equipment  Vision         Perception     Praxis      Pertinent Vitals/Pain Pain Assessment: 0-10 Pain Score: 3  Pain Location: R hip Pain Descriptors /  Indicators: Discomfort;Grimacing;Sore Pain Intervention(s): Limited activity within patient's tolerance;Monitored during session;Repositioned     Hand Dominance     Extremity/Trunk Assessment Upper Extremity Assessment Upper Extremity Assessment: Overall WFL for tasks assessed   Lower Extremity Assessment Lower Extremity Assessment: RLE deficits/detail RLE Deficits / Details: post-op deficits as anticipated RLE Coordination: decreased gross motor       Communication Communication Communication: HOH   Cognition Arousal/Alertness: Awake/alert Behavior During Therapy: WFL for tasks assessed/performed Overall Cognitive Status: History of cognitive impairments - at baseline                                 General Comments: very sweet and cooperative, aware of wife, answered questions appropriately   General Comments  Pt's wife says that he had been working with OT/PT and just "discharged" but was still working with SLP. She also reports that he has been working with Palliative services in the community    Exercises Exercises: General Lower Extremity General Exercises - Lower Extremity Ankle Circles/Pumps: AROM;Both;10 reps;Supine Heel Slides: Right;Left;10 reps;Supine;AAROM Hip ABduction/ADduction: AAROM;Right;Left;10 reps;Supine   Shoulder Instructions      Home Living Family/patient expects to be discharged to:: Private residence Living Arrangements: Spouse/significant other Available Help at Discharge: Family;Available PRN/intermittently;Personal care attendant (wife 24/7, adult children/grandchildren PRN, CNA Sunday and Wednesday nights) Type of Home: House Home Access: Level entry (door threshold only)     Home Layout: One level     Bathroom Shower/Tub: Producer, television/film/video: Handicapped height Bathroom Accessibility: Yes   Home Equipment: Environmental consultant - 2 wheels;Shower seat - built in;Grab bars - toilet;Grab bars - tub/shower;Hand held shower  head   Additional Comments: been falling frequently within the last 3 weeks, involved with community Palliative Care, Wife says that he sleeps 80% of the time      Prior Functioning/Environment Level of Independence: Needs assistance  Gait / Transfers Assistance Needed: RW for mobility - and when he doesn't is prone to fall ADL's / Homemaking Assistance Needed: Wife does bathing/dressing. Pt self-feeds and requires min A with grooming   Comments: just "finished" community PT/OT but was still working with SLP        OT Problem List: Decreased strength;Decreased range of motion;Decreased activity tolerance;Impaired balance (sitting and/or standing);Pain      OT Treatment/Interventions: Self-care/ADL training;Therapeutic exercise;Manual therapy;DME and/or AE instruction;Therapeutic activities;Patient/family education;Balance training    OT Goals(Current goals can be found in the care plan section) Acute Rehab OT Goals Patient Stated Goal: "to return home" OT Goal Formulation: With family Time For Goal Achievement: 05/15/20 Potential to Achieve Goals: Fair ADL Goals Pt Will Perform Grooming: sitting;with min assist Pt Will Perform Upper Body Dressing: with min guard assist;with caregiver independent in assisting;sitting Pt Will Perform Lower Body Dressing: with max assist;with caregiver independent in assisting;sitting/lateral leans Pt Will Transfer to Toilet: with max assist;stand pivot transfer Pt Will Perform Toileting - Clothing Manipulation and hygiene: with max assist;sitting/lateral leans Additional ADL Goal #1: Pt will perform bed mobility at mod A prior to engaging in ADL  OT Frequency: Min 2X/week   Barriers to D/C: Other (comment) (wife is available 24/7 - but unable to physically provide support that he needs. CHildren/grandchildren/CNA available PRN)  Co-evaluation              AM-PAC OT "6 Clicks" Daily Activity     Outcome Measure Help from another  person eating meals?: A Little Help from another person taking care of personal grooming?: A Little Help from another person toileting, which includes using toliet, bedpan, or urinal?: Total Help from another person bathing (including washing, rinsing, drying)?: A Lot Help from another person to put on and taking off regular upper body clothing?: A Lot Help from another person to put on and taking off regular lower body clothing?: Total 6 Click Score: 12   End of Session Equipment Utilized During Treatment: Gait belt;Rolling walker Nurse Communication: Mobility status;Other (comment) (IV with complete infusion)  Activity Tolerance: Patient tolerated treatment well Patient left: in bed;with call bell/phone within reach;with bed alarm set;with family/visitor present  OT Visit Diagnosis: Other abnormalities of gait and mobility (R26.89);Muscle weakness (generalized) (M62.81);History of falling (Z91.81);Pain Pain - Right/Left: Right Pain - part of body: Hip                Time: 0901-1003 OT Time Calculation (min): 62 min Charges:  OT General Charges $OT Visit: 1 Visit OT Evaluation $OT Eval Moderate Complexity: 1 Mod OT Treatments $Self Care/Home Management : 38-52 mins  Jesse Sans OTR/L Acute Rehabilitation Services Pager: [phone] Office: [place] 04/30/2020, 10:59 AM

## 2020-05-01 ENCOUNTER — Encounter (HOSPITAL_COMMUNITY): Payer: Self-pay | Admitting: Orthopedic Surgery

## 2020-05-01 DIAGNOSIS — S728X1D Other fracture of right femur, subsequent encounter for closed fracture with routine healing: Secondary | ICD-10-CM | POA: Diagnosis not present

## 2020-05-01 LAB — BASIC METABOLIC PANEL
Anion gap: 6 (ref 5–15)
BUN: 15 mg/dL (ref 8–23)
CO2: 23 mmol/L (ref 22–32)
Calcium: 7.8 mg/dL — ABNORMAL LOW (ref 8.9–10.3)
Chloride: 110 mmol/L (ref 98–111)
Creatinine, Ser: 0.71 mg/dL (ref 0.61–1.24)
GFR, Estimated: >60 mL/min (ref >60)
Glucose, Bld: 165 mg/dL — ABNORMAL HIGH (ref 70–99)
Potassium: 3.7 mmol/L (ref 3.5–5.1)
Sodium: 139 mmol/L (ref 135–145)

## 2020-05-01 LAB — GLUCOSE, CAPILLARY
Glucose-Capillary: 139 mg/dL — ABNORMAL HIGH (ref 70–99)
Glucose-Capillary: 128 mg/dL — ABNORMAL HIGH (ref 70–99)
Glucose-Capillary: 125 mg/dL — ABNORMAL HIGH (ref 70–99)
Glucose-Capillary: 144 mg/dL — ABNORMAL HIGH (ref 70–99)

## 2020-05-01 LAB — CBC
HCT: 27.9 % — ABNORMAL LOW (ref 39.0–52.0)
Hemoglobin: 9.2 g/dL — ABNORMAL LOW (ref 13.0–17.0)
MCH: 32.1 pg (ref 26.0–34.0)
MCHC: 33 g/dL (ref 30.0–36.0)
MCV: 97.2 fL (ref 80.0–100.0)
Platelets: 134 10*3/uL — ABNORMAL LOW (ref 150–400)
RBC: 2.87 MIL/uL — ABNORMAL LOW (ref 4.22–5.81)
RDW: 13 % (ref 11.5–15.5)
WBC: 6.2 10*3/uL (ref 4.0–10.5)
nRBC: 0 % (ref 0.0–0.2)

## 2020-05-01 LAB — HEMOGLOBIN A1C
Hgb A1c MFr Bld: 5.5 % (ref 4.8–5.6)
Mean Plasma Glucose: 111.15 mg/dL

## 2020-05-01 MED ORDER — ASPIRIN 81 MG PO TBEC: 81.0000 mg | DELAYED_RELEASE_TABLET | Freq: Two times a day (BID) | ORAL | 0 refills | Status: AC

## 2020-05-01 MED ORDER — HYDROCODONE-ACETAMINOPHEN 5-325 MG PO TABS
1.0000 | ORAL_TABLET | ORAL | 0 refills | Status: AC | PRN
Start: 2020-05-01 — End: 2020-05-08

## 2020-05-01 NOTE — Progress Notes (Signed)
PROGRESS NOTE    Rodney Love  WER:[id number] DOB: May 29, 1945 DOA: 04/28/2020 PCP: Rodney Amel, MD    Chief Complaint  Patient presents with  . Fall    Brief Narrative:   75 y.o.malewith medical history significant ofParkinson's disease, diabetes, coronary artery disease, hypertension and hyperlipidemia who apparently sustained a mechanical fall at home. Orthopedics consulted and he underwent right hip arthroplasty. Pt seen and examined . He appears comfortable.    Assessment & Plan:   Principal Problem:   Femur fracture, right (HCC) Active Problems:   PAD (peripheral artery disease) s/p remote left external iliac artery angioplasty   Coronary atherosclerosis   Hypercholesterolemia   Essential hypertension   Diabetes mellitus type 2 in nonobese (HCC)   Carotid occlusion, right   PD (Parkinson's disease) ([place])   Right femur fracture: Orthopedics consulted and underwent right hip arthroplasty.  Pain control .  Therapy evaluations recommending SNF versus CIR. Wife at bedside wants to take him home with Home health PT and then palliative care follow up on discharge.   Parkinson's disease Continue with carbidopa, levodopa No change in medications.    Diabetes Mellitus:  CBG (last 3)  Recent Labs    04/30/20 1555 04/30/20 2115 05/01/20 0818  GLUCAP 174* 180* 139*   Resume SSI. Hemoglobin A1c is 5.5%.  No change in meds.  Continue with Metformin.   Constipation Resolved.   Hyperlipidemia:  Resume statin.    Hypokalemia Replaced.    Hypertension Optimal BP parameters.    History of coronary artery disease Patient is on aspirin continue the same. Patient currently denies any chest pain.   Anemia of acute blood loss probably from the procedure baseline hemoglobin between 14 to 12. , dropped to 9.2 post procedure. No bleeding seen.   Continue to monitor.   DVT prophylaxis: (Lovenox) Code Status: (Full code) Family Communication:  Wife at  bedside.  Disposition:   Status is: Inpatient  Remains inpatient appropriate because:Ongoing diagnostic testing needed not appropriate for outpatient work up, IV treatments appropriate due to intensity of illness or inability to take PO and Inpatient level of care appropriate due to severity of illness   Dispo: The patient is from: Home              Anticipated d/c is to: pending.               Anticipated d/c date is: 2 days              Patient currently is not medically stable to d/c.   Difficult to place patient No   Level of care: Med-Surg     Consultants:    orthopedics. Dr Lyla Glassing.   Procedures:  Right hip arthroplasty on 04/29/20  Antimicrobials:  Antibiotics Given (last 72 hours)    Date/Time Action Medication Dose Rate   04/29/20 1925 New Bag/Given   ceFAZolin (ANCEF) IVPB 2g/100 mL premix 2 g 200 mL/hr   04/30/20 0110 New Bag/Given   ceFAZolin (ANCEF) IVPB 2g/100 mL premix 2 g 200 mL/hr         Subjective: Lethargic. Sleepy from the pain medication given last night.  Objective: Vitals:   04/30/20 1020 04/30/20 1345 04/30/20 2045 05/01/20 0448  BP: (!) 103/57 127/63 136/80 133/76  Pulse: 64 77 99 67  Resp: 18 16 18 17   Temp: 98.6 F (37 C) 97.6 F (36.4 C) (!) 97.3 F (36.3 C) 97.7 F (36.5 C)  TempSrc: Oral Oral Oral Oral  SpO2: 100%  100% 96% 96%  Weight:      Height:        Intake/Output Summary (Last 24 hours) at 05/01/2020 1128 Last data filed at 05/01/2020 1000 Gross per 24 hour  Intake 1908.55 ml  Output 450 ml  Net 1458.55 ml   Filed Weights   04/28/20 1513  Weight: 74.4 kg    Examination:  General exam: elderly gentleman, not in distress.  Respiratory system: clear to auscultation, no wheezing or rhonchi.  Cardiovascular system: S1S2, RRR no JVD, no pedal edema.  Gastrointestinal system: Abdomen is soft nontender non distended, bowel sounds wnl.  Central nervous system: pt sleepy but woke briefly to answer questions, ate  breakfast.  Extremities: no pedal edema.  Skin: no rashes seen.  Psychiatry: Cannot be assessed    Data Reviewed: I have personally reviewed following labs and imaging studies  CBC: Recent Labs  Lab 04/28/20 1607 04/29/20 0425 04/30/20 0550 05/01/20 0559  WBC 8.1 6.7 9.9 6.2  NEUTROABS 6.1  --   --   --   HGB 14.3 12.6* 10.9* 9.2*  HCT 43.6 38.6* 32.8* 27.9*  MCV 97.3 98.0 95.9 97.2  PLT 162 152 150 134*    Basic Metabolic Panel: Recent Labs  Lab 04/28/20 1607 04/29/20 0425 04/30/20 0550 05/01/20 0559  NA 139 142 138 139  K 3.2* 3.4* 3.4* 3.7  CL 104 110 105 110  CO2 26 24 23 23   GLUCOSE 150* 102* 120* 165*  BUN 11 14 19 15   CREATININE 0.84 0.82 0.89 0.71  CALCIUM 9.0 8.6* 8.1* 7.8*    GFR: Estimated Creatinine Clearance: 82.7 mL/min (by C-G formula based on SCr of 0.71 mg/dL).  Liver Function Tests: Recent Labs  Lab 04/28/20 1607 04/29/20 0425  AST 16 14*  ALT 12 17  ALKPHOS 84 70  BILITOT 1.8* 1.6*  PROT 7.3 6.1*  ALBUMIN 3.8 3.1*    CBG: Recent Labs  Lab 04/30/20 0756 04/30/20 1141 04/30/20 1555 04/30/20 2115 05/01/20 0818  GLUCAP 112* 196* 174* 180* 139*     Recent Results (from the past 240 hour(s))  SARS Coronavirus 2 by RT PCR (hospital order, performed in [facility] hospital lab) Nasopharyngeal Nasopharyngeal Swab     Status: None   Collection Time: 04/28/20  4:07 PM   Specimen: Nasopharyngeal Swab  Result Value Ref Range Status   SARS Coronavirus 2 NEGATIVE NEGATIVE Final    Comment: (NOTE) SARS-CoV-2 target nucleic acids are NOT DETECTED.  The SARS-CoV-2 RNA is generally detectable in upper and lower respiratory specimens during the acute phase of infection. The lowest concentration of SARS-CoV-2 viral copies this assay can detect is 250 copies / mL. A negative result does not preclude SARS-CoV-2 infection and should not be used as the sole basis for treatment or other patient management decisions.  A negative result may  occur with improper specimen collection / handling, submission of specimen other than nasopharyngeal swab, presence of viral mutation(s) within the areas targeted by this assay, and inadequate number of viral copies (<250 copies / mL). A negative result must be combined with clinical observations, patient history, and epidemiological information.  Fact Sheet for Patients:   StrictlyIdeas.no  Fact Sheet for Healthcare Providers: BankingDealers.co.za  This test is not yet approved or  cleared by the [place] FDA and has been authorized for detection and/or diagnosis of SARS-CoV-2 by FDA under an Emergency Use Authorization (EUA).  This EUA will remain in effect (meaning this test can be used) for the  duration of the COVID-19 declaration under Section 564(b)(1) of the Act, 21 U.S.C. section 360bbb-3(b)(1), unless the authorization is terminated or revoked sooner.  Performed at [facility], [place] [address] [place], [place] [address]          Radiology Studies: Pelvis Portable  Result Date: 04/29/2020 CLINICAL DATA:  Status post right hip arthroplasty. EXAM: PORTABLE PELVIS 1-2 VIEWS COMPARISON:  09/03/2019 and right hip C-arm images obtained earlier today. FINDINGS: Right hip bipolar prosthesis in satisfactory position and alignment. No fracture or dislocation seen. Atheromatous arterial calcifications. Right pelvic surgical clips. Diffuse osteopenia. IMPRESSION: Satisfactory appearance of a right hip bipolar prosthesis. Electronically Signed   By: Claudie Revering M.D.   On: 04/29/2020 14:59   DG C-Arm 1-60 Min-No Report  Result Date: 04/29/2020 Fluoroscopy was utilized by the requesting physician.  No radiographic interpretation.   DG HIP OPERATIVE UNILAT W OR W/O PELVIS RIGHT  Result Date: 04/29/2020 CLINICAL DATA:  75 year old male with a history of right hip arthroplasty EXAM: OPERATIVE RIGHT HIP (WITH PELVIS  IF PERFORMED) 2 VIEWS TECHNIQUE: Fluoroscopic spot image(s) were submitted for interpretation post-operatively. COMPARISON:  04/28/2020 FINDINGS: Limited intraoperative fluoroscopic spot images of the right hip demonstrating right hip arthroplasty for fracture fixation. Components are aligned. IMPRESSION: Limited intraoperative fluoroscopic spot images of right hip arthroplasty. Please refer to the dictated operative report for full details of intraoperative findings and procedure. Electronically Signed   By: Corrie Mckusick D.O.   On: 04/29/2020 14:00        Scheduled Meds: . aspirin EC  81 mg Oral Daily  . atorvastatin  20 mg Oral Daily  . carbidopa-levodopa  1 tablet Oral QHS  . Carbidopa-Levodopa ER  1 tablet Oral QID  . Chlorhexidine Gluconate Cloth  6 each Topical Daily  . cholecalciferol  1,000 Units Oral Daily  . docusate sodium  100 mg Oral BID  . enoxaparin (LOVENOX) injection  40 mg Subcutaneous Q24H  . influenza vaccine adjuvanted  0.5 mL Intramuscular Tomorrow-1000  . insulin aspart  0-5 Units Subcutaneous QHS  . insulin aspart  0-9 Units Subcutaneous TID WC  . metFORMIN  850 mg Oral Once per day on Mon Wed Fri  . QUEtiapine  12.5 mg Oral QHS  . senna  1 tablet Oral BID   Continuous Infusions: . 0.9 % NaCl with KCl 20 mEq / L 75 mL/hr at 05/01/20 0600     LOS: 3 days        Hosie Poisson, MD [facility]   To contact the attending provider between 7A-7P or the covering provider during after hours 7P-7A, please log into the web site [email or web address] and access using universal [place] password for that web site. If you do not have the password, please call the hospital operator.  05/01/2020, 11:28 AM

## 2020-05-01 NOTE — TOC Initial Note (Signed)
Transition of Care [facility]) - Initial/Assessment Note    Patient Details  Name: Rodney Love MRN: [id number] Date of Birth: Jan 24, 1946  [facility]) CM/SW Contact:    Lennart Pall, LCSW Phone Number: 05/01/2020, 12:34 PM  Clinical Narrative:                 Met with pt and wife this morning to introduce self/ role and discuss dc needs/ plans.  Wife asks that I also speak with their daughter, [name], whom I did contact after discussion with wife.  Have reviewed with all that CIR has declined pt for admission and recommending SNF rehab.  (CIR admissions liaison has been able to speak with pt's daughter as well to review reasons for decline).  Wife and daughter very much want to avoid SNF and prefer to plan for home dc with whatever DME items will be needed to meet care needs.  Wife notes that they are still active with [facility] and would like to continue with those services. Have stressed to all that primary concerns are that pt requires much more physical assistance now and that this may be too much for wife to provide.  They are open to increasing support from other family members as well as getting any DME (I.e. hoyer lift, hospital bed, etc.)  Have alerted PT who will work with pt again tomorrow and make f/u and DME recommendations.  TOC will assist with dc referrals and anticipating need for PTAR to transport home.  Expected Discharge Plan: [place] Barriers to Discharge: Continued Medical Work up   Patient Goals and CMS Choice Patient states their goals for this hospitalization and ongoing recovery are:: pt/ family goals are for pt to be able to dc home CMS Medicare.gov Compare Post Acute Care list provided to:: Patient Choice offered to / list presented to : [facility]  Expected Discharge Plan and Services Expected Discharge Plan: [place] In-house Referral: Clinical Social Work     Living arrangements for the past 2  months: [place]                                      Prior Living Arrangements/Services Living arrangements for the past 2 months: Single Family Home Lives with:: Spouse Patient language and need for interpreter reviewed:: Yes Do you feel safe going back to the place where you live?: Yes      Need for Family Participation in Patient Care: Yes (Comment) Care giver support system in place?: Yes (comment) Current home services: Other (comment) (ST via [place])    Activities of Daily Living Home Assistive Devices/Equipment: Walker (specify type),Eyeglasses,Dentures (specify type),Bedside commode/3-in-1,Shower chair with back (full dentures) ADL Screening (condition at time of admission) Patient's cognitive ability adequate to safely complete daily activities?: No Is the patient deaf or have difficulty hearing?: Yes Does the patient have difficulty seeing, even when wearing glasses/contacts?: No Does the patient have difficulty concentrating, remembering, or making decisions?: Yes Patient able to express need for assistance with ADLs?: Yes Does the patient have difficulty dressing or bathing?: Yes Independently performs ADLs?: No Communication: Independent Dressing (OT): Needs assistance Is this a change from baseline?: Pre-admission baseline Grooming: Independent Feeding: Independent Bathing: Needs assistance Is this a change from baseline?: Pre-admission baseline Toileting: Needs assistance Is this a change from baseline?: Pre-admission baseline In/Out Bed: Needs assistance  Is this a change from baseline?: Pre-admission baseline Walks in Home: Dependent Is this a change from baseline?: Pre-admission baseline Does the patient have difficulty walking or climbing stairs?: Yes Weakness of Legs: Right Weakness of Arms/Hands: None  Permission Sought/Granted Permission sought to share information with : Family Supports Permission granted to share information  with : Yes, Verbal Permission Granted  Share Information with NAME: wife, Lennell Shanks @ [phone] and daughter, [name] @ [phone]           Emotional Assessment Appearance:: Appears stated age Attitude/Demeanor/Rapport: Sedated   Orientation: : Oriented to Self,Oriented to Place Alcohol / Substance Use: Not Applicable Psych Involvement: No (comment)  Admission diagnosis:  Pain [R52] Femur fracture, right (HCC) [S72.91XA] Closed fracture of neck of right femur, initial encounter ([place]) [S72.001A] Closed displaced fracture of right femoral neck ([place]) [S72.001A] Patient Active Problem List   Diagnosis Date Noted  . Femur fracture, right ([place]) 04/28/2020  . PD (Parkinson's disease) ([place]) 12/18/2015  . Tremor of both hands 11/22/2015  . PAD (peripheral artery disease) s/p remote left external iliac artery angioplasty 10/28/2012  . Coronary atherosclerosis 10/28/2012  . Hypercholesterolemia 10/28/2012  . Essential hypertension 10/28/2012  . Diabetes mellitus type 2 in nonobese ([place]) 10/28/2012  . Carotid occlusion, right 10/28/2012   PCP:  Lujean Amel, MD Pharmacy:   [facility] [place] [address] [place] [address] Phone: [phone] Fax: [phone]     Social Determinants of Health ([place]) Interventions    Readmission Risk Interventions Readmission Risk Prevention Plan 05/01/2020  Transportation Screening Complete  PCP or Specialist Appt within 5-7 Days Complete  Home Care Screening Complete  Medication Review (RN CM) Complete  Some recent data might be hidden

## 2020-05-01 NOTE — Progress Notes (Addendum)
    Subjective:  Patient's wife states that patient has been experiencing only mild pain.  She states that he hasn't experienced N/V/CP/SOB. Patient is sleeping soundly  Objective:   VITALS:   Vitals:   04/30/20 1020 04/30/20 1345 04/30/20 2045 05/01/20 0448  BP: (!) 103/57 127/63 136/80 133/76  Pulse: 64 77 99 67  Resp: 18 16 18 17   Temp: 98.6 F (37 C) 97.6 F (36.4 C) (!) 97.3 F (36.3 C) 97.7 F (36.5 C)  TempSrc: Oral Oral Oral Oral  SpO2: 100% 100% 96% 96%  Weight:      Height:        NAD ABD soft Neurovascular intact Sensation intact distally Intact pulses distally Incision: dressing C/D/I   Lab Results  Component Value Date   WBC 6.2 05/01/2020   HGB 9.2 (L) 05/01/2020   HCT 27.9 (L) 05/01/2020   MCV 97.2 05/01/2020   PLT 134 (L) 05/01/2020   BMET    Component Value Date/Time   NA 139 05/01/2020 0559   K 3.7 05/01/2020 0559   CL 110 05/01/2020 0559   CO2 23 05/01/2020 0559   GLUCOSE 165 (H) 05/01/2020 0559   BUN 15 05/01/2020 0559   CREATININE 0.71 05/01/2020 0559   CALCIUM 7.8 (L) 05/01/2020 0559   GFRNONAA >60 05/01/2020 0559   GFRAA >60 11/15/2019 1800     Assessment/Plan: 2 Days Post-Op   Principal Problem:   Femur fracture, right (HCC) Active Problems:   PAD (peripheral artery disease) s/p remote left external iliac artery angioplasty   Coronary atherosclerosis   Hypercholesterolemia   Essential hypertension   Diabetes mellitus type 2 in nonobese (HCC)   Carotid occlusion, right   PD (Parkinson's disease) ([place])   WBAT with walker DVT ppx: Aspirin and Lovenox, SCDs, TEDS PO pain control PT/OT Dispo: D/C pending. Wife would prefer HHPT vs SNF     Dorothyann Peng 05/01/2020, 12:00 PM  [place] [facility] is now [facility] [address] [place], [place],  [address] Phone: [phone] [email or web address] Facebook  [employer]

## 2020-05-01 NOTE — Care Management Important Message (Signed)
Important Message  Patient Details IM Letter given to the Patient. Name: Rodney Love MRN: [id number] Date of Birth: 1946-02-13   Medicare Important Message Given:  Yes     Kerin Salen 05/01/2020, 2:58 PM

## 2020-05-02 DIAGNOSIS — S72001A Fracture of unspecified part of neck of right femur, initial encounter for closed fracture: Secondary | ICD-10-CM

## 2020-05-02 LAB — CBC
HCT: 31 % — ABNORMAL LOW (ref 39.0–52.0)
Hemoglobin: 10 g/dL — ABNORMAL LOW (ref 13.0–17.0)
MCH: 31.5 pg (ref 26.0–34.0)
MCHC: 32.3 g/dL (ref 30.0–36.0)
MCV: 97.8 fL (ref 80.0–100.0)
Platelets: 143 10*3/uL — ABNORMAL LOW (ref 150–400)
RBC: 3.17 MIL/uL — ABNORMAL LOW (ref 4.22–5.81)
RDW: 12.8 % (ref 11.5–15.5)
WBC: 6.2 10*3/uL (ref 4.0–10.5)
nRBC: 0 % (ref 0.0–0.2)

## 2020-05-02 LAB — GLUCOSE, CAPILLARY
Glucose-Capillary: 118 mg/dL — ABNORMAL HIGH (ref 70–99)
Glucose-Capillary: 124 mg/dL — ABNORMAL HIGH (ref 70–99)
Glucose-Capillary: 134 mg/dL — ABNORMAL HIGH (ref 70–99)
Glucose-Capillary: 148 mg/dL — ABNORMAL HIGH (ref 70–99)
Glucose-Capillary: 155 mg/dL — ABNORMAL HIGH (ref 70–99)

## 2020-05-02 NOTE — Progress Notes (Signed)
Occupational Therapy Treatment Patient Details Name: Rodney Love MRN: [id number] DOB: [birth year] Today's Date: 05/02/2020    History of present illness 75 y.o. male with medical history significant of Parkinson's disease, diabetes, coronary artery disease, hypertension and hyperlipidemia who apparently sustained a mechanical fall at home. Now s/p Right total hip arthroplasty, direct anterior approach.   OT comments  Patient progressing and showed improved ability to perform sit<>stands from elevated EOB with use of Denna Haggard and moderate assistance of 1 person, compared to previous session where pt required assistance of 2. Patient limited by severe weakness, stiffness and retropulsion typical of Parkinson's dysfunctional movement patterns, and poor toelrance to standing, along with deficits noted below. Pt continues to demonstrate good rehab potential and would benefit from continued skilled OT to increase safety and independence with ADLs and functional transfers to allow pt to return home safely and reduce caregiver burden and fall risk. Per last OT note: "Due to cognitive baseline of Parkinsons, it is the families wishes that he go home. If this happens they will need lift equipment. Wife is concerned about Pt going SNF and not being able to visit hime/be with him."   Follow Up Recommendations  SNF (Recommend SNF, however spouse wants to take pt home, per chart.)   Pt pt goes home, recommend [facility] PT and OT   Equipment Recommendations  3 in 1 bedside commode;Wheelchair (measurements OT);Wheelchair cushion (measurements OT);Hospital bed;Other (comment) Firefighter, Drop-Arm bedside commode.)    Recommendations for Other Services      Precautions / Restrictions Precautions Precautions: Fall Precaution Comments: direct anterior no restrictions Restrictions Weight Bearing Restrictions: No RLE Weight Bearing: Weight bearing as tolerated       Mobility Bed Mobility Overal bed  mobility: Needs Assistance Bed Mobility: Supine to Sit;Sit to Supine     Supine to sit: Max assist;HOB elevated Sit to supine: Max assist   General bed mobility comments: Increased time. Multimodal cueing required. Assist for trunk and bil LEs. Utilized bedpad to assist with scooting RT hip anteriorly and positioning.  Transfers Overall transfer level: Needs assistance   Transfers: Sit to/from Stand Sit to Stand: Mod assist;From elevated surface         General transfer comment: Sit to stand x 3 ( with STEDY initially for transfer training, then for peri hygiene). Mod A +1 to stand each rep with STEDY. Multimodal cueing required for safety, technique, feet placement, anterior weightshifting. Required high bed surface. Difficulty coming to a full upright position.    Balance Overall balance assessment: Needs assistance;History of Falls Sitting-balance support: Feet supported;Single extremity supported Sitting balance-Leahy Scale: Poor Sitting balance - Comments: Pt lost balance posteriorly x 2 while EOB with assist needed to return to upright position. Pt given cues to aim nose over knees with some improvement but unable to maintain over ~1 min. Postural control: Posterior lean Standing balance support: Bilateral upper extremity supported Standing balance-Leahy Scale: Poor Standing balance comment: dependent on therapist support and BUE support on sara STEDY                           ADL either performed or assessed with clinical judgement   ADL                   Upper Body Dressing : Maximal assistance;Bed level;Cueing for sequencing   Lower Body Dressing: Total assistance;Bed level   Toilet Transfer:  (Worked on sit<>stands with Denna Haggard as  pre-traiing for [facility] transfers.  Please see Mobility section.)   Toileting- Clothing Manipulation and Hygiene: Total assistance;Sit to/from stand Clarise Cruz Stedy x 3 stands due to poor standing tolerance.)        Functional mobility during ADLs: Maximal assistance;Cueing for sequencing       Vision       Perception     Praxis      Cognition Arousal/Alertness: Lethargic   Overall Cognitive Status: History of cognitive impairments - at baseline                                 General Comments: Cooperative and pleasant. Keeps eye closed often, but will verbally respong. More alert with eye open once EOB and standing.        Exercises     Shoulder Instructions       General Comments      Pertinent Vitals/ Pain       Pain Assessment: No/denies pain Pain Location: R hip. Pt denied pain whevever asked at rest or with mobility and weight bearing. Pain Intervention(s): Monitored during session  Home Living                                          Prior Functioning/Environment              Frequency  Min 2X/week        Progress Toward Goals  OT Goals(current goals can now be found in the care plan section)  Progress towards OT goals: Progressing toward goals     Plan Discharge plan remains appropriate    Co-evaluation                 AM-PAC OT "6 Clicks" Daily Activity     Outcome Measure   Help from another person eating meals?: A Little Help from another person taking care of personal grooming?: A Little Help from another person toileting, which includes using toliet, bedpan, or urinal?: Total Help from another person bathing (including washing, rinsing, drying)?: A Lot Help from another person to put on and taking off regular upper body clothing?: A Lot Help from another person to put on and taking off regular lower body clothing?: Total 6 Click Score: 12    End of Session Equipment Utilized During Treatment: Gait belt;Other (comment) (STEDY)  OT Visit Diagnosis: Other abnormalities of gait and mobility (R26.89);Muscle weakness (generalized) (M62.81);History of falling (Z91.81);Pain   Activity Tolerance Patient  tolerated treatment well   Patient Left in bed;with call bell/phone within reach;with bed alarm set;with family/visitor present   Nurse Communication Mobility status        Time: 4315-4008 OT Time Calculation (min): 38 min  Charges: OT General Charges $OT Visit: 1 Visit OT Treatments $Self Care/Home Management : 8-22 mins $Therapeutic Activity: 23-37 mins  Anderson [place], [place] Office: [phone] 05/02/2020   Julien Girt 05/02/2020, 11:10 AM

## 2020-05-02 NOTE — Progress Notes (Signed)
PROGRESS NOTE    Rodney Love  ZWC:[id number] DOB: [birth year] DOA: 04/28/2020 PCP: Lujean Amel, MD    Chief Complaint  Patient presents with  . Fall    Brief Narrative:   75 y.o.malewith medical history significant ofParkinson's disease, diabetes, coronary artery disease, hypertension and hyperlipidemia who apparently sustained a mechanical fall at home. Orthopedics consulted and he underwent right hip arthroplasty. Patient seen and examined with wife at bedside.  No new complaints at this time   Assessment & Plan:   Principal Problem:   Femur fracture, right ([place]) Active Problems:   PAD (peripheral artery disease) s/p remote left external iliac artery angioplasty   Coronary atherosclerosis   Hypercholesterolemia   Essential hypertension   Diabetes mellitus type 2 in nonobese (HCC)   Carotid occlusion, right   PD (Parkinson's disease) ([place])   Right femur fracture: Orthopedics consulted and underwent right hip arthroplasty.  Pain control .  Therapy evaluations recommending SNF versus CIR. Wife at bedside wants to take him home with Home health PT, OT and then palliative care follow up on discharge.  Discussed with daughter the plan and suggested that patient is high fall risk. Daughter would like hospital bed and a Hoyer lift to be delivered prior to discharge.  Parkinson's disease Resume home medications at this time.     Diabetes Mellitus:  CBG (last 3)  Recent Labs    05/01/20 2210 05/02/20 0008 05/02/20 0742  GLUCAP 144* 124* 118*   Resume SSI. Hemoglobin A1c is 5.5%.  No change in meds.  Continue with Metformin.   Constipation Resolved  Hyperlipidemia:  Resume statin.    Hypokalemia Replaced.    Hypertension Blood pressure parameters are well controlled.   History of coronary artery disease Patient is on aspirin continue the same. Patient currently denies any chest pain.   Mild thrombocytopenia No bleeding seen.     Anemia  of acute blood loss probably from the procedure baseline hemoglobin between 14 to 12. , dropped to 9.2 post procedure. No bleeding seen.   Hemoglobin stabilized around 10.  DVT prophylaxis: (Lovenox) Code Status: (Full code) Family Communication:  Wife at bedside.  Disposition:   Status is: Inpatient  Remains inpatient appropriate because:Unsafe d/c plan   Dispo: The patient is from: Home              Anticipated d/c is to: Home              Anticipated d/c date is: 1 day              Patient currently is medically stable to d/c.   Difficult to place patient No   Level of care: Med-Surg     Consultants:    orthopedics. Dr Lyla Glassing.   Procedures:  Right hip arthroplasty on 04/29/20  Antimicrobials:  Antibiotics Given (last 72 hours)    Date/Time Action Medication Dose Rate   04/29/20 1925 New Bag/Given   ceFAZolin (ANCEF) IVPB 2g/100 mL premix 2 g 200 mL/hr   04/30/20 0110 New Bag/Given   ceFAZolin (ANCEF) IVPB 2g/100 mL premix 2 g 200 mL/hr         Subjective: No new complaints at this time Objective: Vitals:   05/01/20 0448 05/01/20 1434 05/01/20 2014 05/02/20 0630  BP: 133/76 (!) 120/96 (!) 145/62 118/74  Pulse: 67 80 75 62  Resp: 17 (!) 22 18 18   Temp: 97.7 F (36.5 C) 98.5 F (36.9 C) 99.1 F (37.3 C) 98 F (36.7 C)  TempSrc: Oral Oral Oral Oral  SpO2: 96% 95% 96% 97%  Weight:      Height:        Intake/Output Summary (Last 24 hours) at 05/02/2020 6270 Last data filed at 05/02/2020 0600 Gross per 24 hour  Intake 2746.9 ml  Output 2675 ml  Net 71.9 ml   Filed Weights   04/28/20 1513  Weight: 74.4 kg    Examination:  General exam: Elderly gentleman, appears to be comfortable Respiratory system: Clear to auscultation, no wheezing or rhonchi Cardiovascular system: S1-S2 heard, regular rate rhythm, no JVD no pedal edema Gastrointestinal system: Abdomen is soft nontender, nondistended bowel sounds normal Central nervous system: Patient alert,  able to answer simple questions and working with physical therapy Extremities: No pedal edema Skin: no rashesseen.  Psychiatry: Cannot be assessed    Data Reviewed: I have personally reviewed following labs and imaging studies  CBC: Recent Labs  Lab 04/28/20 1607 04/29/20 0425 04/30/20 0550 05/01/20 0559 05/02/20 0518  WBC 8.1 6.7 9.9 6.2 6.2  NEUTROABS 6.1  --   --   --   --   HGB 14.3 12.6* 10.9* 9.2* 10.0*  HCT 43.6 38.6* 32.8* 27.9* 31.0*  MCV 97.3 98.0 95.9 97.2 97.8  PLT 162 152 150 134* 143*    Basic Metabolic Panel: Recent Labs  Lab 04/28/20 1607 04/29/20 0425 04/30/20 0550 05/01/20 0559  NA 139 142 138 139  K 3.2* 3.4* 3.4* 3.7  CL 104 110 105 110  CO2 26 24 23 23   GLUCOSE 150* 102* 120* 165*  BUN 11 14 19 15   CREATININE 0.84 0.82 0.89 0.71  CALCIUM 9.0 8.6* 8.1* 7.8*    GFR: Estimated Creatinine Clearance: 82.7 mL/min (by C-G formula based on SCr of 0.71 mg/dL).  Liver Function Tests: Recent Labs  Lab 04/28/20 1607 04/29/20 0425  AST 16 14*  ALT 12 17  ALKPHOS 84 70  BILITOT 1.8* 1.6*  PROT 7.3 6.1*  ALBUMIN 3.8 3.1*    CBG: Recent Labs  Lab 05/01/20 1201 05/01/20 1555 05/01/20 2210 05/02/20 0008 05/02/20 0742  GLUCAP 128* 125* 144* 124* 118*     Recent Results (from the past 240 hour(s))  SARS Coronavirus 2 by RT PCR (hospital order, performed in [facility] hospital lab) Nasopharyngeal Nasopharyngeal Swab     Status: None   Collection Time: 04/28/20  4:07 PM   Specimen: Nasopharyngeal Swab  Result Value Ref Range Status   SARS Coronavirus 2 NEGATIVE NEGATIVE Final    Comment: (NOTE) SARS-CoV-2 target nucleic acids are NOT DETECTED.  The SARS-CoV-2 RNA is generally detectable in upper and lower respiratory specimens during the acute phase of infection. The lowest concentration of SARS-CoV-2 viral copies this assay can detect is 250 copies / mL. A negative result does not preclude SARS-CoV-2 infection and should not be used  as the sole basis for treatment or other patient management decisions.  A negative result may occur with improper specimen collection / handling, submission of specimen other than nasopharyngeal swab, presence of viral mutation(s) within the areas targeted by this assay, and inadequate number of viral copies (<250 copies / mL). A negative result must be combined with clinical observations, patient history, and epidemiological information.  Fact Sheet for Patients:   StrictlyIdeas.no  Fact Sheet for Healthcare Providers: BankingDealers.co.za  This test is not yet approved or  cleared by the [place] FDA and has been authorized for detection and/or diagnosis of SARS-CoV-2 by FDA under an Emergency Use Authorization (  EUA).  This EUA will remain in effect (meaning this test can be used) for the duration of the COVID-19 declaration under Section 564(b)(1) of the Act, 21 U.S.C. section 360bbb-3(b)(1), unless the authorization is terminated or revoked sooner.  Performed at [facility], [place] [address], [place], [place] [address]          Radiology Studies: No results found.      Scheduled Meds: . aspirin EC  81 mg Oral Daily  . atorvastatin  20 mg Oral Daily  . carbidopa-levodopa  1 tablet Oral QHS  . Carbidopa-Levodopa ER  1 tablet Oral QID  . Chlorhexidine Gluconate Cloth  6 each Topical Daily  . cholecalciferol  1,000 Units Oral Daily  . docusate sodium  100 mg Oral BID  . enoxaparin (LOVENOX) injection  40 mg Subcutaneous Q24H  . influenza vaccine adjuvanted  0.5 mL Intramuscular Tomorrow-1000  . insulin aspart  0-5 Units Subcutaneous QHS  . insulin aspart  0-9 Units Subcutaneous TID WC  . metFORMIN  850 mg Oral Once per day on Mon Wed Fri  . QUEtiapine  12.5 mg Oral QHS  . senna  1 tablet Oral BID   Continuous Infusions: . 0.9 % NaCl with KCl 20 mEq / L 75 mL/hr at 05/02/20 0540     LOS: 4  days        Hosie Poisson, MD [facility]   To contact the attending provider between 7A-7P or the covering provider during after hours 7P-7A, please log into the web site [email or web address] and access using universal [place] password for that web site. If you do not have the password, please call the hospital operator.  05/02/2020, 9:34 AM

## 2020-05-02 NOTE — TOC Progression Note (Signed)
Transition of Care [facility]) - Progression Note    Patient Details  Name: Rodney Love MRN: [id number] Date of Birth: 26-Jan-1946  Transition of Care [facility]) CM/SW Contact  Lennart Pall, LCSW Phone Number: 05/02/2020, 4:04 PM  Clinical Narrative:    Have spoken with pt, spouse and daughter, [name] today to discuss dc plans further.  Family prefers that pt be dc'd home "if it's possible."  Have explained that the decision is theirs to make, however, we want that to be an informed decision.  Have reviewed therapy reports from sessions today and stressed the need for maximum physical assistance at home and DME recommendations (which we will need to train caregivers in use).  Daughter to discuss further with pt's spouse and other family members this evening and we will get finalized plan tomorrow.    Expected Discharge Plan: [place] Barriers to Discharge: Continued Medical Work up  Expected Discharge Plan and Services Expected Discharge Plan: [place] In-house Referral: Clinical Social Work     Living arrangements for the past 2 months: Single Family Home                                       Social Determinants of Health (SDOH) Interventions    Readmission Risk Interventions Readmission Risk Prevention Plan 05/01/2020  Transportation Screening Complete  PCP or Specialist Appt within 5-7 Days Complete  Home Care Screening Complete  Medication Review (RN CM) Complete  Some recent data might be hidden

## 2020-05-02 NOTE — Progress Notes (Signed)
Physical Therapy Treatment Patient Details Name: Rodney Love MRN: [id number] DOB: 04/17/1945 Today's Date: 05/02/2020    History of Present Illness 74 y.o. male with medical history significant of Parkinson's disease, diabetes, coronary artery disease, hypertension and hyperlipidemia who apparently sustained a mechanical fall at home. Now s/p Right total hip arthroplasty, direct anterior approach.    PT Comments    POD # 3 am session Pt progressing slowly.  General bed mobility comments: Increased time. Multimodal cueing required. Assist for trunk and bil LEs. Utilized bedpad to assist with scooting RT hip anteriorly and positioning.  Severe posterior lean.  Rigid.  B LE in extensor tone.  Increased time to encourage 90/90 seated posture. (hips 90 degrees/knees 90 degrees)  still unable to achieve.  General transfer comment: sit to stand with difficulty severe posture lean and rigidity.  Required + 2 Max Assist to rise and + 2 Total Assist to pivot 1/4 turn.  Pt started to "ski" and was "frozen" and unable to initiate weight shift and control a safe balance.  HIGH FALL RISK.  General Gait Details: pt unable to initiate functional mavts mainly due to his Parkinson's.   Follow Up Recommendations  CIR;SNF;Home health PT (CIR has declined and spouse declining SNF)     Equipment Recommendations  [facility] Bed Lift  [facility]   Recommendations for Other Services Rehab consult;OT consult     Precautions / Restrictions Precautions Precautions: Fall Precaution Comments: direct anterior no restrictions Restrictions Weight Bearing Restrictions: No RLE Weight Bearing: Weight bearing as tolerated    Mobility  Bed Mobility Overal bed mobility: Needs Assistance Bed Mobility: Supine to Sit     Supine to sit: Max assist;HOB elevated Sit to supine: Max assist   General bed mobility comments: Increased time. Multimodal cueing required. Assist for trunk and bil LEs. Utilized bedpad to assist  with scooting RT hip anteriorly and positioning.  Severe posterior lean.  Rigid.  B LE in extensor tone.  Increased time to encourage 90/90 seated posture. (hips 90 degrees/knees 90 degrees)  still unable to achieve.  Transfers Overall transfer level: Needs assistance   Transfers: Sit to/from Stand Sit to Stand: Max assist;Total assist;+2 physical assistance;+2 safety/equipment         General transfer comment: sit to stand with difficulty severe posture lean and rigidity.  Required + 2 Max Assist to rise and + 2 Total Assist to pivot 1/4 turn.  Pt started to "ski" and was "frozen" and unable to initiate weight shift and control a safe balance.  HIGH FALL RISK.  Ambulation/Gait             General Gait Details: pt unable to initiate functional mavts mainly due to his Parkinson's   Stairs             Wheelchair Mobility    Modified Rankin (Stroke Patients Only)       Balance Overall balance assessment: Needs assistance;History of Falls Sitting-balance support: Feet supported;Single extremity supported Sitting balance-Leahy Scale: Poor Sitting balance - Comments: Pt lost balance posteriorly x 2 while EOB with assist needed to return to upright position. Pt given cues to aim nose over knees with some improvement but unable to maintain over ~1 min. Postural control: Posterior lean Standing balance support: Bilateral upper extremity supported Standing balance-Leahy Scale: Poor Standing balance comment: dependent on therapist support and BUE support on sara STEDY  Cognition Arousal/Alertness: Awake/alert Behavior During Therapy: WFL for tasks assessed/performed Overall Cognitive Status: Within Functional Limits for tasks assessed                                 General Comments: AxA x 3 HOH and slightly delayed to process and respond      Exercises      General Comments        Pertinent Vitals/Pain Pain  Assessment: Faces Faces Pain Scale: Hurts a little bit Pain Location: R hip. Pt denied pain whevever asked at rest or with mobility and weight bearing. Pain Descriptors / Indicators: Discomfort;Grimacing;Sore;Sharp Pain Intervention(s): Monitored during session;Repositioned    Home Living                      Prior Function            PT Goals (current goals can now be found in the care plan section) Progress towards PT goals: Progressing toward goals    Frequency    Min 3X/week      PT Plan Current plan remains appropriate    Co-evaluation              AM-PAC PT "6 Clicks" Mobility   Outcome Measure  Help needed turning from your back to your side while in a flat bed without using bedrails?: Total Help needed moving from lying on your back to sitting on the side of a flat bed without using bedrails?: Total Help needed moving to and from a bed to a chair (including a wheelchair)?: Total Help needed standing up from a chair using your arms (e.g., wheelchair or bedside chair)?: Total Help needed to walk in hospital room?: Total Help needed climbing 3-5 steps with a railing? : Total 6 Click Score: 6    End of Session Equipment Utilized During Treatment: Gait belt Activity Tolerance: Patient tolerated treatment well Patient left: in chair;with call bell/phone within reach;with chair alarm set;with family/visitor present Nurse Communication: Need for lift equipment PT Visit Diagnosis: Muscle weakness (generalized) (M62.81);History of falling (Z91.81);Pain;Other abnormalities of gait and mobility (R26.89) Pain - Right/Left: Right Pain - part of body: Hip     Time: 7672-0947 PT Time Calculation (min) (ACUTE ONLY): 27 min  Charges:  $Therapeutic Activity: 23-37 mins                     Rica Koyanagi  PTA Acute  Rehabilitation Services Pager      [phone] Office      [phone]

## 2020-05-03 DIAGNOSIS — L89153 Pressure ulcer of sacral region, stage 3: Secondary | ICD-10-CM

## 2020-05-03 DIAGNOSIS — I2510 Atherosclerotic heart disease of native coronary artery without angina pectoris: Secondary | ICD-10-CM | POA: Diagnosis not present

## 2020-05-03 DIAGNOSIS — S728X1D Other fracture of right femur, subsequent encounter for closed fracture with routine healing: Secondary | ICD-10-CM | POA: Diagnosis not present

## 2020-05-03 DIAGNOSIS — S72001A Fracture of unspecified part of neck of right femur, initial encounter for closed fracture: Secondary | ICD-10-CM | POA: Diagnosis not present

## 2020-05-03 DIAGNOSIS — L8990 Pressure ulcer of unspecified site, unspecified stage: Secondary | ICD-10-CM | POA: Insufficient documentation

## 2020-05-03 DIAGNOSIS — E119 Type 2 diabetes mellitus without complications: Secondary | ICD-10-CM | POA: Diagnosis not present

## 2020-05-03 LAB — GLUCOSE, CAPILLARY
Glucose-Capillary: 117 mg/dL — ABNORMAL HIGH (ref 70–99)
Glucose-Capillary: 129 mg/dL — ABNORMAL HIGH (ref 70–99)
Glucose-Capillary: 112 mg/dL — ABNORMAL HIGH (ref 70–99)
Glucose-Capillary: 136 mg/dL — ABNORMAL HIGH (ref 70–99)

## 2020-05-03 MED ORDER — INFLUENZA VAC A&B SA ADJ QUAD 0.5 ML IM PRSY
0.5000 mL | PREFILLED_SYRINGE | INTRAMUSCULAR | Status: AC
  Administered 2020-05-05: 0.5 mL via INTRAMUSCULAR
  Filled 2020-05-03 (×2): qty 0.5

## 2020-05-03 NOTE — TOC Progression Note (Signed)
Transition of Care [facility]) - Progression Note    Patient Details  Name: Rodney Love MRN: [id number] Date of Birth: April 18, 1945  Transition of [facility]) CM/SW Contact  Lennart Pall, LCSW Phone Number: 05/03/2020, 12:53 PM  Clinical Narrative:    Have spoken with pt's wife and daughter today and they have decided to stay with plan for home dc.  They are arranging for private duty caregivers to supplement help at home.  Have placed order for hospital bed and hoyer lift via [employer] but cannot be delivered until tomorrow.  [facility] will provide HHPT,OT, RN services.  TOC will continue to follow.   Expected Discharge Plan: [place] Barriers to Discharge: Continued Medical Work up  Expected Discharge Plan and Services Expected Discharge Plan: [place] In-house Referral: Clinical Social Work     Living arrangements for the past 2 months: Single Family Home                                       Social Determinants of Health (SDOH) Interventions    Readmission Risk Interventions Readmission Risk Prevention Plan 05/01/2020  Transportation Screening Complete  PCP or Specialist Appt within 5-7 Days Complete  Home Care Screening Complete  Medication Review (RN CM) Complete  Some recent data might be hidden

## 2020-05-03 NOTE — Progress Notes (Signed)
Physical Therapy Treatment Patient Details Name: Rodney Love MRN: [id number] DOB: Oct 07, 1945 Today's Date: 05/03/2020    History of Present Illness 75 y.o. male with medical history significant of Parkinson's disease, diabetes, coronary artery disease, hypertension and hyperlipidemia who apparently sustained a mechanical fall at home. Now s/p Right total hip arthroplasty, direct anterior approach.    PT Comments    POD 4 pm session Family plans to have pt return home.  Assisted out of recliner to [facility] then back to bed was difficult and required + 2 assist.  General transfer comment: Pt unable to functionally self perform traditional sit to stand due to his Parkinson's.  While pt held handles of the walker we used a  gait belt around pt's waist to pull him to an upright position.  Therapist stood in front of pt pulling pt's body weight forward using belt as second person assisted with balance.  Same fashion for a controlled stand to sit.  Assisted off recliner and on/ogg BSC for BM + 2 assist.  pt was unab;e to complete 1/4 turn to bed so bed pulled up from behind to meet pt.General Gait Details: transfer only this session due to BM upond standing. General bed mobility comments: assisted back to bed required Max Assist full support rolling side to side for hygiene and position for comfort.     Follow Up Recommendations  CIR;SNF;Home health PT (CIR declined, family declines SNF, plans are for Home via [place])     Equipment Recommendations  None recommended by PT    Recommendations for Other Services Rehab consult;OT consult     Precautions / Restrictions Precautions Precautions: Fall Precaution Comments: direct anterior no restrictions/Hx Parkinsons Restrictions Weight Bearing Restrictions: No RLE Weight Bearing: Weight bearing as tolerated    Mobility  Bed Mobility Overal bed mobility: Needs Assistance Bed Mobility: Sit to Supine     Supine to sit: Max assist;HOB  elevated Sit to supine: Max assist   General bed mobility comments: assisted back to bed required Max Assist full support rolling side to side for hygiene and position for comfort  Transfers Overall transfer level: Needs assistance Equipment used: Rolling walker (2 wheeled);None Transfers: Sit to/from Stand Sit to Stand: Max assist;Total assist;+2 physical assistance;+2 safety/equipment         General transfer comment: Pt unable to functionally self perform traditional sit to stand due to his Parkinson's.  While pt held handles of the walker we used a  gait belt around pt's waist to pull him to an upright position.  Therapist stood in front of pt pulling pt's body weight forward using belt as second person assisted with balance.  Same fashion for a controlled stand to sit.  Assisted off recliner and on/ogg BSC for BM + 2 assist.  pt was unab;e to complete 1/4 turn to bed so bed pulled up from behind to meet pt.  Ambulation/Gait   General Gait Details: transfer only this session due to BM upond standing   Stairs             Wheelchair Mobility    Modified Rankin (Stroke Patients Only)       Balance Overall balance assessment: Needs assistance;History of Falls Sitting-balance support: Feet supported;Single extremity supported Sitting balance-Leahy Scale: Poor Sitting balance - Comments: Pt lost balance posteriorly x 2 while EOB with assist needed to return to upright position. Pt given cues to aim nose over knees with some improvement but unable to maintain over ~1 min.  Postural control: Posterior lean Standing balance support: Bilateral upper extremity supported Standing balance-Leahy Scale: Poor Standing balance comment: dependent on therapist support and BUE support on sara STEDY                            Cognition Arousal/Alertness: Awake/alert Behavior During Therapy: WFL for tasks assessed/performed Overall Cognitive Status: Within Functional Limits  for tasks assessed                                 General Comments: AxA x 3 HOH and slightly delayed to process and respond, delayed speech  Hx Parkinsons      Exercises      General Comments General comments (skin integrity, edema, etc.): wife concerns of continued HH services upon dc.      Pertinent Vitals/Pain Pain Assessment: No/denies pain Pain Location: R hip. Pt denied pain whevever asked at rest or with mobility and weight bearing. Pain Descriptors / Indicators: Discomfort;Grimacing;Sore;Sharp Pain Intervention(s): Monitored during session;Repositioned    Home Living                      Prior Function            PT Goals (current goals can now be found in the care plan section) Acute Rehab PT Goals Patient Stated Goal: to regain PLOF Progress towards PT goals: Progressing toward goals    Frequency    Min 3X/week      PT Plan Current plan remains appropriate    Co-evaluation              AM-PAC PT "6 Clicks" Mobility   Outcome Measure  Help needed turning from your back to your side while in a flat bed without using bedrails?: Total Help needed moving from lying on your back to sitting on the side of a flat bed without using bedrails?: Total Help needed moving to and from a bed to a chair (including a wheelchair)?: Total Help needed standing up from a chair using your arms (e.g., wheelchair or bedside chair)?: Total Help needed to walk in hospital room?: Total Help needed climbing 3-5 steps with a railing? : Total 6 Click Score: 6    End of Session Equipment Utilized During Treatment: Gait belt Activity Tolerance: Patient tolerated treatment well Patient left: in bed;with bed alarm set;with family/visitor present Nurse Communication: Need for lift equipment PT Visit Diagnosis: Muscle weakness (generalized) (M62.81);History of falling (Z91.81);Pain;Other abnormalities of gait and mobility (R26.89) Pain - Right/Left:  Right Pain - part of body: Hip     Time: 3785-8850 PT Time Calculation (min) (ACUTE ONLY): 35 min  Charges:   $Therapeutic Activity: 23-37 mins                     Rica Koyanagi  PTA Acute  Rehabilitation Services Pager      [phone] Office      [phone]

## 2020-05-03 NOTE — Progress Notes (Signed)
Physical Therapy Treatment Patient Details Name: Rodney Love MRN: [id number] DOB: Aug 27, 1945 Today's Date: 05/03/2020    History of Present Illness 75 y.o. male with medical history significant of Parkinson's disease, diabetes, coronary artery disease, hypertension and hyperlipidemia who apparently sustained a mechanical fall at home. Now s/p Right total hip arthroplasty, direct anterior approach.    PT Comments    POD # 4 Pt progressing slowly.  Spouse present during session for education.  Assisted OOB required + 2 assist.  General bed mobility comments: required much assist due to his Parkinson's rigidity, delayed initiation and severe posterior lean.  Diffiicult to achieve upright seated postior EOB present with b LE extensor tone and posterior push upper body.General transfer comment: Pt unable to functionally self perform traditional sit to stand due to his Parkinson's.  While pt held handles of the walker we used a  gait belt around pt's waist to pull him to an upright position.  Therapist stood in front of pt pulling pt's body weight forward using belt as second person assisted with balance.  Same fashion for a controlled stand to sit.  General Gait Details: Very unsteady, short shuffled steps typical Parkinson's Gait required + 2 assist present with severe posterior lean.  Assisted to recliner and positioned to comfort.    Follow Up Recommendations  CIR;SNF;Home health PT (CIR declined, family declines SNF, plans are for Home via [place])     Equipment Recommendations  None recommended by PT    Recommendations for Other Services Rehab consult;OT consult     Precautions / Restrictions Precautions Precautions: Fall Precaution Comments: direct anterior no restrictions/Hx Parkinsons Restrictions Weight Bearing Restrictions: No RLE Weight Bearing: Weight bearing as tolerated    Mobility  Bed Mobility Overal bed mobility: Needs Assistance Bed Mobility: Supine to Sit      Supine to sit: Max assist;HOB elevated     General bed mobility comments: required much assist due to his Parkinson's rigidity, delayed initiation and severe posterior lean.  Diffiicult to achieve upright seated postior EOB present with b LE extensor tone and posterior push upper body.  Transfers Overall transfer level: Needs assistance Equipment used: Rolling walker (2 wheeled);None (gait belt) Transfers: Sit to/from Stand Sit to Stand: Max assist;Total assist;+2 physical assistance;+2 safety/equipment         General transfer comment: Pt unable to functionally self perform traditional sit to stand due to his Parkinson's.  While pt held handles of the walker we used a  gait belt around pt's waist to pull him to an upright position.  Therapist stood in front of pt pulling pt's body weight forward using belt as second person assisted with balance.  Same fashion for a controlled stand to sit.  Ambulation/Gait Ambulation/Gait assistance: Mod assist;+2 safety/equipment;+2 physical assistance Gait Distance (Feet): 18 Feet Assistive device: Rolling walker (2 wheeled) Gait Pattern/deviations: Step-to pattern;Decreased step length - right;Decreased step length - left;Shuffle;Ataxic;Festinating Gait velocity: decreased   General Gait Details: Very unsteady, short shuffled steps typical Parkinson's Gait required + 2 assist present with severe posterior lean   Stairs             Wheelchair Mobility    Modified Rankin (Stroke Patients Only)       Balance Overall balance assessment: Needs assistance;History of Falls Sitting-balance support: Feet supported;Single extremity supported Sitting balance-Leahy Scale: Poor Sitting balance - Comments: Pt lost balance posteriorly x 2 while EOB with assist needed to return to upright position. Pt given cues to aim  nose over knees with some improvement but unable to maintain over ~1 min. Postural control: Posterior lean Standing balance  support: Bilateral upper extremity supported Standing balance-Leahy Scale: Poor Standing balance comment: dependent on therapist support and BUE support on sara STEDY                            Cognition Arousal/Alertness: Awake/alert Behavior During Therapy: WFL for tasks assessed/performed Overall Cognitive Status: Within Functional Limits for tasks assessed                                 General Comments: AxA x 3 HOH and slightly delayed to process and respond, delayed speech  Hx Parkinsons      Exercises      General Comments General comments (skin integrity, edema, etc.): wife concerns of continued HH services upon dc.      Pertinent Vitals/Pain Pain Assessment: No/denies pain Pain Location: R hip. Pt denied pain whevever asked at rest or with mobility and weight bearing. Pain Descriptors / Indicators: Discomfort;Grimacing;Sore;Sharp Pain Intervention(s): Monitored during session;Repositioned    Home Living                      Prior Function            PT Goals (current goals can now be found in the care plan section) Acute Rehab PT Goals Patient Stated Goal: to regain PLOF Progress towards PT goals: Progressing toward goals    Frequency    Min 3X/week      PT Plan Current plan remains appropriate    Co-evaluation              AM-PAC PT "6 Clicks" Mobility   Outcome Measure  Help needed turning from your back to your side while in a flat bed without using bedrails?: Total Help needed moving from lying on your back to sitting on the side of a flat bed without using bedrails?: Total Help needed moving to and from a bed to a chair (including a wheelchair)?: Total Help needed standing up from a chair using your arms (e.g., wheelchair or bedside chair)?: Total Help needed to walk in hospital room?: Total Help needed climbing 3-5 steps with a railing? : Total 6 Click Score: 6    End of Session Equipment Utilized  During Treatment: Gait belt Activity Tolerance: Patient tolerated treatment well Patient left: in chair;with call bell/phone within reach;with chair alarm set;with family/visitor present Nurse Communication: Need for lift equipment PT Visit Diagnosis: Muscle weakness (generalized) (M62.81);History of falling (Z91.81);Pain;Other abnormalities of gait and mobility (R26.89) Pain - Right/Left: Right Pain - part of body: Hip     Time: 2505-3976 PT Time Calculation (min) (ACUTE ONLY): 29 min  Charges:  $Gait Training: 8-22 mins $Therapeutic Activity: 8-22 mins                     Rica Koyanagi  PTA Acute  Rehabilitation Services Pager      [phone] Office      [phone]

## 2020-05-03 NOTE — Progress Notes (Signed)
Occupational Therapy Treatment Patient Details Name: LYNDEL SARATE MRN: [id number] DOB: 1945/05/14 Today's Date: 05/03/2020    History of present illness 75 y.o. male with medical history significant of Parkinson's disease, diabetes, coronary artery disease, hypertension and hyperlipidemia who apparently sustained a mechanical fall at home. Now s/p Right total hip arthroplasty, direct anterior approach.   OT comments  Pt received asleep, with wife at bedside, wife agreeable to caregiver education AE ( hoyer lift ) to ensure safety with functional transfers within home environment. Wife instructed of controls and correct placement of pad when in use. Pt slightly confused and attempted use railing of hoyer lift to sit up, VC's required to remove hands. Wife has concerns of continued HHOT, pt will benefit to continue HHOT to address strength and safety with ADLs in home environment as well as safety for caregivers with education and techniques needed for functional tasks/AE. Pt left with set up of meal for self feeding. Continued POC. HHOT for D/C however, 24 hour care required.    Follow Up Recommendations  SNF;Home health OT;Supervision/Assistance - 24 hour (Recommend SNF, however spouse wants to take pt home, per chart.)    Equipment Recommendations  3 in 1 bedside commode;Wheelchair (measurements OT);Wheelchair cushion (measurements OT);Hospital bed;Other (comment) Firefighter, Drop-Arm bedside commode.)    Recommendations for Other Services Other (comment) (Palliative)    Precautions / Restrictions Precautions Precautions: Fall Precaution Comments: direct anterior no restrictions Restrictions Weight Bearing Restrictions: Yes RLE Weight Bearing: Weight bearing as tolerated       Mobility Bed Mobility Overal bed mobility: Needs Assistance             General bed mobility comments: Pt received sitting in recliner with hoyer lift pad placed.  Transfers Overall transfer  level: Needs assistance Equipment used: Rolling walker (2 wheeled) Transfers: Sit to/from Stand           General transfer comment: caregiver education of maximove hoyer lift, additional practice required in home setting with [employer].    Balance Overall balance assessment: Needs assistance;History of Falls Sitting-balance support: Feet supported;Single extremity supported Sitting balance-Leahy Scale: Poor Sitting balance - Comments: Pt lost balance posteriorly x 2 while EOB with assist needed to return to upright position. Pt given cues to aim nose over knees with some improvement but unable to maintain over ~1 min. Postural control: Posterior lean Standing balance support: Bilateral upper extremity supported Standing balance-Leahy Scale: Poor Standing balance comment: dependent on therapist support and BUE support on sara STEDY                           ADL either performed or assessed with clinical judgement   ADL Overall ADL's : Needs assistance/impaired Eating/Feeding: Set up;Sitting Eating/Feeding Details (indicate cue type and reason): End of session, pt received meal tray and set up required while seated in recliner                     Toilet Transfer:  (Worked on sit<>stands with Denna Haggard as pre-traiing for [facility] transfers.  Please see Mobility section.)   Toileting- Clothing Manipulation and Hygiene:  Clarise Cruz Stedy x 3 stands due to poor standing tolerance.)       Functional mobility during ADLs: Maximal assistance;Cueing for sequencing General ADL Comments: Session address caregiver education of AE (hoyer lift) with demonstration and verbal instructions.     Vision       Perception  Praxis      Cognition Arousal/Alertness: Awake/alert Behavior During Therapy: WFL for tasks assessed/performed Overall Cognitive Status: Within Functional Limits for tasks assessed                                 General Comments: AxA x 3 HOH and  slightly delayed to process and respond        Exercises     Shoulder Instructions       General Comments wife concerns of continued HH services upon dc.    Pertinent Vitals/ Pain       Pain Assessment: Faces Pain Location: R hip. Pt denied pain whevever asked at rest or with mobility and weight bearing. Pain Descriptors / Indicators: Discomfort;Grimacing;Sore;Sharp Pain Intervention(s): Monitored during session;Repositioned  Home Living                                          Prior Functioning/Environment              Frequency  Min 2X/week        Progress Toward Goals  OT Goals(current goals can now be found in the care plan section)  Progress towards OT goals: Progressing toward goals  Acute Rehab OT Goals Patient Stated Goal: to regain PLOF OT Goal Formulation: With family Time For Goal Achievement: 05/15/20 Potential to Achieve Goals: Fair ADL Goals Pt Will Perform Grooming: sitting;with min assist Pt Will Perform Upper Body Dressing: with min guard assist;with caregiver independent in assisting;sitting Pt Will Perform Lower Body Dressing: with max assist;with caregiver independent in assisting;sitting/lateral leans Pt Will Transfer to Toilet: with max assist;stand pivot transfer Pt Will Perform Toileting - Clothing Manipulation and hygiene: with max assist;sitting/lateral leans Additional ADL Goal #1: Pt will perform bed mobility at mod A prior to engaging in ADL  Plan Discharge plan remains appropriate    Co-evaluation                 AM-PAC OT "6 Clicks" Daily Activity     Outcome Measure   Help from another person eating meals?: A Little Help from another person taking care of personal grooming?: A Little Help from another person toileting, which includes using toliet, bedpan, or urinal?: Total Help from another person bathing (including washing, rinsing, drying)?: A Lot Help from another person to put on and taking off  regular upper body clothing?: A Lot Help from another person to put on and taking off regular lower body clothing?: Total 6 Click Score: 12    End of Session Equipment Utilized During Treatment: Other (comment) (maximove)  OT Visit Diagnosis: Other abnormalities of gait and mobility (R26.89);Muscle weakness (generalized) (M62.81);History of falling (Z91.81);Pain Pain - Right/Left: Right Pain - part of body: Hip   Activity Tolerance Patient tolerated treatment well   Patient Left in bed;with call bell/phone within reach;with bed alarm set;with family/visitor present   Nurse Communication Mobility status        Time: 6269-4854 OT Time Calculation (min): 25 min  Charges: OT General Charges $OT Visit: 1 Visit OT Treatments $Self Care/Home Management : 23-37 mins  Minus Breeding, MSOT, OTR/L  Supplemental Rehabilitation Services  [phone]    Marius Ditch 05/03/2020, 12:33 PM

## 2020-05-03 NOTE — Progress Notes (Signed)
PROGRESS NOTE    ROBERTS BON  GHW:[id number] DOB: [birth year] DOA: 04/28/2020 PCP: Lujean Amel, MD    Chief Complaint  Patient presents with  . Fall    Brief Narrative:  75 y.o.malewith medical history significant ofParkinson's disease, diabetes, coronary artery disease, hypertension and hyperlipidemia who apparently sustained a mechanical fall at home. Orthopedics consulted and he underwent right hip arthroplasty. Patient seen and examined with wife at bedside.  No new complaints at this time    Assessment & Plan:   Principal Problem:   Femur fracture, right ([place]) Active Problems:   PAD (peripheral artery disease) s/p remote left external iliac artery angioplasty   Coronary atherosclerosis   Hypercholesterolemia   Essential hypertension   Diabetes mellitus type 2 in nonobese [facility])   Carotid occlusion, right   PD (Parkinson's disease) ([place])   Pressure injury of skin  1 right femur fracture Secondary to mechanical fall.  Patient was seen in consultation by orthopedics and underwent right hip arthroplasty.  PT/OT.  Continue current pain regimen.  PT/OT assess patient and recommended SNF versus CIR.  Family prefers to take patient home with home health therapies to follow as well as palliative care on discharge.  Dr. Karleen Hampshire discussed with patient's daughter and suggested that patient is high fall risk.  It is noted that daughter would like a hospital bed and [facility] lift bed to be delivered prior to discharge.  Currently awaiting hospital bed to be delivered.  Outpatient follow-up with orthopedics.  2.  Parkinson's disease Stable.  Continue home regimen Sinemet.  3.  Hypertension Blood pressure stable.  Follow.  4.  Well-controlled type 2 diabetes mellitus Hemoglobin A1c 5.5 (05/01/2020).  CBG 117 this morning.  Continue Metformin.  Outpatient follow-up.  5.  Constipation Resolved.  6.  Hyperlipidemia Continue statin.  7.  Hypokalemia Repleted.  Potassium at  3.7.  8.  History of coronary artery disease Continue aspirin, statin.  9.  Mild thrombocytopenia No bleeding.  10. stage III pressure injury to the coccyx Pressure Injury 05/02/20 Sacrum Stage 2 -  Partial thickness loss of dermis presenting as a shallow open injury with a red, pink wound bed without slough. (Active)  05/02/20 (Pt admitted on 1/29 and stage 1 was documented on initial assessment) 1830  Location: Sacrum  Location Orientation:   Staging: Stage 2 -  Partial thickness loss of dermis presenting as a shallow open injury with a red, pink wound bed without slough.  Wound Description (Comments):   Present on Admission:         DVT prophylaxis: Lovenox Code Status: Full Family Communication: Updated patient and wife at bedside. Disposition:   Status is: Inpatient    Dispo: The patient is from: Home              Anticipated d/c is to: Home with home health              Anticipated d/c date is: 05/04/2020              Patient currently medically stable for discharge awaiting hospital bed to be delivered at home.     Difficult to place patient no       Consultants:   Orthopedics: Dr. Lyla Glassing 04/29/2020  Wound care 05/03/2020  Procedures:   CT head/ CT C-spine 04/28/2020  Plain films right foot 04/28/2020  Chest x-ray 04/28/2020  Plain films of the right knee 04/28/2020  Plain films of the pelvis 04/29/2020  Plain films of the right  hip and pelvis 04/28/2020  Right total hip arthroplasty per Dr. Lyla Glassing 04/29/2020  Antimicrobials:   IV Ancef 04/29/2020>>>> 04/30/2020   Subjective: Sitting up in chair.  Denies any chest pain or shortness of breath.  Wife at bedside.  Objective: Vitals:   05/02/20 1346 05/02/20 2128 05/03/20 0516 05/03/20 1332  BP: 97/64 132/79 (!) 144/69 117/76  Pulse: 81 76 77 98  Resp: 16 18 18 17   Temp: (!) 97.4 F (36.3 C) 98.3 F (36.8 C) 98.5 F (36.9 C) 97.7 F (36.5 C)  TempSrc: Oral Oral Oral Oral  SpO2: 100% 100% 98%  98%  Weight:      Height:        Intake/Output Summary (Last 24 hours) at 05/03/2020 1838 Last data filed at 05/03/2020 1800 Gross per 24 hour  Intake 2999.76 ml  Output 3100 ml  Net -100.24 ml   Filed Weights   04/28/20 1513  Weight: 74.4 kg    Examination:  General exam: NAD Respiratory system: Clear to auscultation. Respiratory effort normal. Cardiovascular system: S1 & S2 heard, RRR. No JVD, murmurs, rubs, gallops or clicks. No pedal edema. Gastrointestinal system: Abdomen is nondistended, soft and nontender. No organomegaly or masses felt. Normal bowel sounds heard. Central nervous system: Alert and oriented. No focal neurological deficits. Extremities: Symmetric 5 x 5 power. Skin: No rashes, lesions or ulcers Psychiatry: Judgement and insight appear normal. Mood & affect appropriate.     Data Reviewed: I have personally reviewed following labs and imaging studies  CBC: Recent Labs  Lab 04/28/20 1607 04/29/20 0425 04/30/20 0550 05/01/20 0559 05/02/20 0518  WBC 8.1 6.7 9.9 6.2 6.2  NEUTROABS 6.1  --   --   --   --   HGB 14.3 12.6* 10.9* 9.2* 10.0*  HCT 43.6 38.6* 32.8* 27.9* 31.0*  MCV 97.3 98.0 95.9 97.2 97.8  PLT 162 152 150 134* 143*    Basic Metabolic Panel: Recent Labs  Lab 04/28/20 1607 04/29/20 0425 04/30/20 0550 05/01/20 0559  NA 139 142 138 139  K 3.2* 3.4* 3.4* 3.7  CL 104 110 105 110  CO2 26 24 23 23   GLUCOSE 150* 102* 120* 165*  BUN 11 14 19 15   CREATININE 0.84 0.82 0.89 0.71  CALCIUM 9.0 8.6* 8.1* 7.8*    GFR: Estimated Creatinine Clearance: 82.7 mL/min (by C-G formula based on SCr of 0.71 mg/dL).  Liver Function Tests: Recent Labs  Lab 04/28/20 1607 04/29/20 0425  AST 16 14*  ALT 12 17  ALKPHOS 84 70  BILITOT 1.8* 1.6*  PROT 7.3 6.1*  ALBUMIN 3.8 3.1*    CBG: Recent Labs  Lab 05/02/20 1546 05/02/20 2306 05/03/20 0749 05/03/20 1131 05/03/20 1633  GLUCAP 134* 148* 117* 129* 112*     Recent Results (from the past  240 hour(s))  SARS Coronavirus 2 by RT PCR (hospital order, performed in [facility] hospital lab) Nasopharyngeal Nasopharyngeal Swab     Status: None   Collection Time: 04/28/20  4:07 PM   Specimen: Nasopharyngeal Swab  Result Value Ref Range Status   SARS Coronavirus 2 NEGATIVE NEGATIVE Final    Comment: (NOTE) SARS-CoV-2 target nucleic acids are NOT DETECTED.  The SARS-CoV-2 RNA is generally detectable in upper and lower respiratory specimens during the acute phase of infection. The lowest concentration of SARS-CoV-2 viral copies this assay can detect is 250 copies / mL. A negative result does not preclude SARS-CoV-2 infection and should not be used as the sole basis for treatment or  other patient management decisions.  A negative result may occur with improper specimen collection / handling, submission of specimen other than nasopharyngeal swab, presence of viral mutation(s) within the areas targeted by this assay, and inadequate number of viral copies (<250 copies / mL). A negative result must be combined with clinical observations, patient history, and epidemiological information.  Fact Sheet for Patients:   StrictlyIdeas.no  Fact Sheet for Healthcare Providers: BankingDealers.co.za  This test is not yet approved or  cleared by the [place] FDA and has been authorized for detection and/or diagnosis of SARS-CoV-2 by FDA under an Emergency Use Authorization (EUA).  This EUA will remain in effect (meaning this test can be used) for the duration of the COVID-19 declaration under Section 564(b)(1) of the Act, 21 U.S.C. section 360bbb-3(b)(1), unless the authorization is terminated or revoked sooner.  Performed at [facility], [place] [address] [place], [place] [address]          Radiology Studies: No results found.      Scheduled Meds: . aspirin EC  81 mg Oral Daily  . atorvastatin  20 mg Oral  Daily  . carbidopa-levodopa  1 tablet Oral QHS  . Carbidopa-Levodopa ER  1 tablet Oral QID  . Chlorhexidine Gluconate Cloth  6 each Topical Daily  . cholecalciferol  1,000 Units Oral Daily  . docusate sodium  100 mg Oral BID  . enoxaparin (LOVENOX) injection  40 mg Subcutaneous Q24H  . [START ON 05/04/2020] influenza vaccine adjuvanted  0.5 mL Intramuscular Tomorrow-1000  . insulin aspart  0-5 Units Subcutaneous QHS  . insulin aspart  0-9 Units Subcutaneous TID WC  . metFORMIN  850 mg Oral Once per day on Mon Wed Fri  . QUEtiapine  12.5 mg Oral QHS  . senna  1 tablet Oral BID   Continuous Infusions:   LOS: 5 days    Time spent: 35 minutes    Irine Seal, MD [facility]   To contact the attending provider between 7A-7P or the covering provider during after hours 7P-7A, please log into the web site [email or web address] and access using universal [place] password for that web site. If you do not have the password, please call the hospital operator.  05/03/2020, 6:38 PM

## 2020-05-03 NOTE — Consult Note (Signed)
[place] Nurse Consult Note: Reason for Consult:Stage 3 pressure injury to coccyx Wife is at bedside and has cared for this area before and states she got it to heal. I believe this is a re-opened  former full thickness pressure injury.  Wound type:stage 3 pressure injury Pressure Injury POA: Yes Measurement: 3 cm x 2 cm x 0.2 cm  Wound ZOX:WRUE and moist with white edges.   Drainage (amount, consistency, odor) none noted Periwound:patient is wet. He has male external urinary manager in place but also a brief and this is wet.  Wife states that brief is needed as he pulls off urinary manager otherwise.  Patient is very stiff and total care to turn and reposition. He speaks to me with eyes closed and answers questions appropriately.  Dressing procedure/placement/frequency:Cleanse coccyx wound with NS and pat dry.  Keep urinary manager in place as much as possible to keep skin clean and dry.  Avoid briefs or disposable underpads. Apply alginate dressing to wound bed. [employer] # [id number])  Cover with silicone foam.  Change every other day and PRN soilage.  Will not follow at this time.  Please re-consult if needed.  Domenic Moras MSN, RN, FNP-BC CWON Wound, Ostomy, Continence Nurse Pager [phone]

## 2020-05-03 NOTE — Progress Notes (Signed)
    Durable Medical Equipment  (From admission, onward)         Start     Ordered   05/03/20 1247  For home use only DME Hospital bed  Once       Question Answer Comment  Length of Need Lifetime   The above medical condition requires: Patient requires the ability to reposition frequently   Bed type Semi-electric   Hoyer Lift Yes   Support Surface: Gel Overlay      05/03/20 1247   05/03/20 0743  For home use only DME lightweight manual wheelchair with seat cushion  Once       Comments: Patient suffers from right femur fracture which impairs their ability to perform daily activities in the home.  A walking aid will not resolve  issue with performing activities of daily living. A wheelchair will allow patient to safely perform daily activities. Patient is not able to propel themselves in the home using a standard weight wheelchair due to weakness. Patient can self propel in the lightweight wheelchair. Length of need 6 months. Accessories: elevating leg rests (ELRs), wheel locks, extensions and anti-tippers.   05/03/20 0743   05/03/20 0742  For home use only DME 3 n 1  Once        05/03/20 0741

## 2020-05-04 DIAGNOSIS — S728X1D Other fracture of right femur, subsequent encounter for closed fracture with routine healing: Secondary | ICD-10-CM | POA: Diagnosis not present

## 2020-05-04 DIAGNOSIS — S72001A Fracture of unspecified part of neck of right femur, initial encounter for closed fracture: Secondary | ICD-10-CM | POA: Diagnosis not present

## 2020-05-04 DIAGNOSIS — I2510 Atherosclerotic heart disease of native coronary artery without angina pectoris: Secondary | ICD-10-CM | POA: Diagnosis not present

## 2020-05-04 DIAGNOSIS — E119 Type 2 diabetes mellitus without complications: Secondary | ICD-10-CM | POA: Diagnosis not present

## 2020-05-04 LAB — GLUCOSE, CAPILLARY
Glucose-Capillary: 118 mg/dL — ABNORMAL HIGH (ref 70–99)
Glucose-Capillary: 214 mg/dL — ABNORMAL HIGH (ref 70–99)
Glucose-Capillary: 101 mg/dL — ABNORMAL HIGH (ref 70–99)
Glucose-Capillary: 165 mg/dL — ABNORMAL HIGH (ref 70–99)

## 2020-05-04 NOTE — Progress Notes (Signed)
PROGRESS NOTE    Rodney Love  OAC:[id number] DOB: 04-27-1945 DOA: 04/28/2020 PCP: Lujean Amel, MD    Chief Complaint  Patient presents with  . Fall    Brief Narrative:  75 y.o.malewith medical history significant ofParkinson's disease, diabetes, coronary artery disease, hypertension and hyperlipidemia who apparently sustained a mechanical fall at home. Orthopedics consulted and he underwent right hip arthroplasty. Patient seen and examined with wife at bedside.  No new complaints at this time    Assessment & Plan:   Principal Problem:   Femur fracture, right ([place]) Active Problems:   PAD (peripheral artery disease) s/p remote left external iliac artery angioplasty   Coronary atherosclerosis   Hypercholesterolemia   Essential hypertension   Diabetes mellitus type 2 in nonobese [facility])   Carotid occlusion, right   PD (Parkinson's disease) ([place])   Pressure injury of skin  1 right femur fracture Secondary to mechanical fall.  Patient was seen in consultation by orthopedics and underwent right hip arthroplasty.  PT/OT.  Continue current pain regimen.  PT/OT assess patient and recommended SNF versus CIR.  Family prefers to take patient home with home health therapies to follow as well as palliative care on discharge.  Dr. Karleen Hampshire discussed with patient's daughter and suggested that patient is high fall risk.  It is noted that daughter would like a hospital bed and [facility] lift bed to be delivered prior to discharge.  Still awaiting hospital bed to be delivered prior to discharge.  Outpatient follow-up with orthopedics.    2.  Parkinson's disease Continue Sinemet.  Outpatient follow-up.    3.  Hypertension Stable.  Follow.    4.  Well-controlled type 2 diabetes mellitus Hemoglobin A1c 5.5 (05/01/2020).  CBG 118.  Continue home regimen metformin.  Outpatient follow-up.  5.  Constipation Resolved.  6.  Hyperlipidemia Statin.    7.  Hypokalemia Repleted.  Outpatient  follow-up.   8.  History of coronary artery disease Stable.  Continue statin, aspirin.   9.  Mild thrombocytopenia Stable.  No bleeding.  Outpatient follow-up.   10. stage III pressure injury to the coccyx Pressure Injury 05/02/20 Sacrum Stage 2 -  Partial thickness loss of dermis presenting as a shallow open injury with a red, pink wound bed without slough. (Active)  05/02/20 (Pt admitted on 1/29 and stage 1 was documented on initial assessment) 1830  Location: Sacrum  Location Orientation:   Staging: Stage 2 -  Partial thickness loss of dermis presenting as a shallow open injury with a red, pink wound bed without slough.  Wound Description (Comments):   Present on Admission:         DVT prophylaxis: Lovenox Code Status: Full Family Communication: Updated patient and wife at bedside. Disposition:   Status is: Inpatient    Dispo: The patient is from: Home              Anticipated d/c is to: Home with home health              Anticipated d/c date is: 05/05/2020              Patient currently medically stable for discharge awaiting hospital bed to be delivered at home.     Difficult to place patient no       Consultants:   Orthopedics: Dr. Lyla Glassing 04/29/2020  Wound care 05/03/2020  Procedures:   CT head/ CT C-spine 04/28/2020  Plain films right foot 04/28/2020  Chest x-ray 04/28/2020  Plain films of the  right knee 04/28/2020  Plain films of the pelvis 04/29/2020  Plain films of the right hip and pelvis 04/28/2020  Right total hip arthroplasty per Dr. Lyla Glassing 04/29/2020  Antimicrobials:   IV Ancef 04/29/2020>>>> 04/30/2020   Subjective: Sitting up in chair.  Denies chest pain or shortness of breath.  Wife at bedside.  Still awaiting hospital bed to be delivered at home.   Objective: Vitals:   05/03/20 0516 05/03/20 1332 05/03/20 2140 05/04/20 0533  BP: (!) 144/69 117/76 125/80 129/76  Pulse: 77 98 96 80  Resp: 18 17 16 18   Temp: 98.5 F (36.9 C) 97.7 F  (36.5 C)    TempSrc: Oral Oral    SpO2: 98% 98% 98% 98%  Weight:      Height:        Intake/Output Summary (Last 24 hours) at 05/04/2020 1150 Last data filed at 05/04/2020 1000 Gross per 24 hour  Intake 720 ml  Output 2050 ml  Net -1330 ml   Filed Weights   04/28/20 1513  Weight: 74.4 kg    Examination:  General exam: : NAD. Slight left hand pill-rolling tremor. Respiratory system: CTAB.  No wheezes, no rhonchi.  Speaking in full sentences.  Normal respiratory effort. Cardiovascular system: Regular rate and rhythm no murmurs rubs or gallops.  No JVD.  No lower extremity edema.  Gastrointestinal system: Abdomen soft, nontender, nondistended, positive bowel sounds.  No rebound.  No guarding. Central nervous system: Alert and oriented. No focal neurological deficits. Extremities: Symmetric 5 x 5 power. Skin: No rashes, lesions or ulcers Psychiatry: Judgement and insight appear normal. Mood & affect appropriate.   Data Reviewed: I have personally reviewed following labs and imaging studies  CBC: Recent Labs  Lab 04/28/20 1607 04/29/20 0425 04/30/20 0550 05/01/20 0559 05/02/20 0518  WBC 8.1 6.7 9.9 6.2 6.2  NEUTROABS 6.1  --   --   --   --   HGB 14.3 12.6* 10.9* 9.2* 10.0*  HCT 43.6 38.6* 32.8* 27.9* 31.0*  MCV 97.3 98.0 95.9 97.2 97.8  PLT 162 152 150 134* 143*    Basic Metabolic Panel: Recent Labs  Lab 04/28/20 1607 04/29/20 0425 04/30/20 0550 05/01/20 0559  NA 139 142 138 139  K 3.2* 3.4* 3.4* 3.7  CL 104 110 105 110  CO2 26 24 23 23   GLUCOSE 150* 102* 120* 165*  BUN 11 14 19 15   CREATININE 0.84 0.82 0.89 0.71  CALCIUM 9.0 8.6* 8.1* 7.8*    GFR: Estimated Creatinine Clearance: 82.7 mL/min (by C-G formula based on SCr of 0.71 mg/dL).  Liver Function Tests: Recent Labs  Lab 04/28/20 1607 04/29/20 0425  AST 16 14*  ALT 12 17  ALKPHOS 84 70  BILITOT 1.8* 1.6*  PROT 7.3 6.1*  ALBUMIN 3.8 3.1*    CBG: Recent Labs  Lab 05/03/20 0749  05/03/20 1131 05/03/20 1633 05/03/20 2139 05/04/20 0747  GLUCAP 117* 129* 112* 136* 118*     Recent Results (from the past 240 hour(s))  SARS Coronavirus 2 by RT PCR (hospital order, performed in [facility] hospital lab) Nasopharyngeal Nasopharyngeal Swab     Status: None   Collection Time: 04/28/20  4:07 PM   Specimen: Nasopharyngeal Swab  Result Value Ref Range Status   SARS Coronavirus 2 NEGATIVE NEGATIVE Final    Comment: (NOTE) SARS-CoV-2 target nucleic acids are NOT DETECTED.  The SARS-CoV-2 RNA is generally detectable in upper and lower respiratory specimens during the acute phase of infection. The lowest concentration  of SARS-CoV-2 viral copies this assay can detect is 250 copies / mL. A negative result does not preclude SARS-CoV-2 infection and should not be used as the sole basis for treatment or other patient management decisions.  A negative result may occur with improper specimen collection / handling, submission of specimen other than nasopharyngeal swab, presence of viral mutation(s) within the areas targeted by this assay, and inadequate number of viral copies (<250 copies / mL). A negative result must be combined with clinical observations, patient history, and epidemiological information.  Fact Sheet for Patients:   StrictlyIdeas.no  Fact Sheet for Healthcare Providers: BankingDealers.co.za  This test is not yet approved or  cleared by the [place] FDA and has been authorized for detection and/or diagnosis of SARS-CoV-2 by FDA under an Emergency Use Authorization (EUA).  This EUA will remain in effect (meaning this test can be used) for the duration of the COVID-19 declaration under Section 564(b)(1) of the Act, 21 U.S.C. section 360bbb-3(b)(1), unless the authorization is terminated or revoked sooner.  Performed at [facility], [place] [address], [place], [place] [address]           Radiology Studies: No results found.      Scheduled Meds: . aspirin EC  81 mg Oral Daily  . atorvastatin  20 mg Oral Daily  . carbidopa-levodopa  1 tablet Oral QHS  . Carbidopa-Levodopa ER  1 tablet Oral QID  . Chlorhexidine Gluconate Cloth  6 each Topical Daily  . cholecalciferol  1,000 Units Oral Daily  . docusate sodium  100 mg Oral BID  . enoxaparin (LOVENOX) injection  40 mg Subcutaneous Q24H  . influenza vaccine adjuvanted  0.5 mL Intramuscular Tomorrow-1000  . insulin aspart  0-5 Units Subcutaneous QHS  . insulin aspart  0-9 Units Subcutaneous TID WC  . metFORMIN  850 mg Oral Once per day on Mon Wed Fri  . QUEtiapine  12.5 mg Oral QHS  . senna  1 tablet Oral BID   Continuous Infusions:   LOS: 6 days    Time spent: 35 minutes    Irine Seal, MD [facility]   To contact the attending provider between 7A-7P or the covering provider during after hours 7P-7A, please log into the web site [email or web address] and access using universal [place] password for that web site. If you do not have the password, please call the hospital operator.  05/04/2020, 11:50 AM

## 2020-05-04 NOTE — Progress Notes (Addendum)
Physical Therapy Treatment Patient Details Name: Rodney Love MRN: [id number] DOB: April 07, 1945 Today's Date: 05/04/2020    History of Present Illness 75 y.o. male with medical history significant of Parkinson's disease, diabetes, coronary artery disease, hypertension and hyperlipidemia who apparently sustained a mechanical fall at home. Now s/p Right total hip arthroplasty, direct anterior approach.    PT Comments    POD # 5 pm session General Gait Details: assisted with amb 12 feet with walker. General transfer comment: + 2 assist to rise due to severe posterior lean and rigidity.  Using gait belt around pt's waist to pull weigh forward as pt stood from recliner.  Severe posterior lean.  Also required assist to control stand to sit. Very short shuffled steps with severe posterior lean.  Parked recliner in hallway such that pt would amb from recliner back to bed.General bed mobility comments: assisted back to bed Max/Total Assist pt < 25% self able due to Parkinson's Rigidity and freezing Positioned to comfort.    Follow Up Recommendations  CIR;SNF;Home health PT (family declines SNFand  plans to take pt home)     Equipment Recommendations  None recommended by PT    Recommendations for Other Services       Precautions / Restrictions Precautions Precautions: Fall Precaution Comments: direct anterior no restrictions/Hx Parkinsons Restrictions Weight Bearing Restrictions: No RLE Weight Bearing: Weight bearing as tolerated    Mobility  Bed Mobility Overal bed mobility: Needs Assistance Bed Mobility: Sit to Supine     Supine to sit: Max assist;HOB elevated Sit to supine: Max assist;Total assist   General bed mobility comments: assisted back to bed Max/Total Assist pt < 25% self able due to Parkinson's Rigidity and freezing  Transfers Overall transfer level: Needs assistance Equipment used: Rolling walker (2 wheeled) Transfers: Sit to/from [facility] Sit to  Stand: Mod assist;From elevated surface;Max assist Stand pivot transfers: Max assist       General transfer comment: + 2 assist to rise due to severe posterior lean and rigidity.  Using gait belt around pt's waist to pull weigh forward as pt stood from recliner.  Severe posterior lean.  Also required assist to control stand to sit.  Ambulation/Gait Ambulation/Gait assistance: Mod assist;+2 safety/equipment;+2 physical assistance Gait Distance (Feet): 12 Feet Assistive device: Rolling walker (2 wheeled) Gait Pattern/deviations: Step-to pattern;Decreased step length - right;Decreased step length - left;Shuffle;Ataxic;Festinating Gait velocity: decreased   General Gait Details: assisted with amb 12 feet with walker.  Very short shuffled steps with severe posterior lean.  Parked recliner in hallway such that pt would amb from recliner back to bed.   Stairs             Wheelchair Mobility    Modified Rankin (Stroke Patients Only)       Balance Overall balance assessment: Needs assistance Sitting-balance support: Feet supported Sitting balance-Leahy Scale: Fair Sitting balance - Comments: initally min assist to maintain balance. With increased time and improved positioning patient min guard at edge of bed   Standing balance support: During functional activity;Bilateral upper extremity supported Standing balance-Leahy Scale: Poor Standing balance comment: dependent on therapist support and BUE support on walker                            Cognition Arousal/Alertness: Awake/alert Behavior During Therapy: WFL for tasks assessed/performed Overall Cognitive Status: Within Functional Limits for tasks assessed  General Comments: HOH and slightly delayed to process and respond, delayed speech  Hx Parkinsons      Exercises      General Comments        Pertinent Vitals/Pain Pain Assessment: Faces Faces Pain Scale:  Hurts a little bit Pain Location: R hip Pain Descriptors / Indicators: Grimacing Pain Intervention(s): Monitored during session;Repositioned    Home Living                      Prior Function            PT Goals (current goals can now be found in the care plan section) Acute Rehab PT Goals Patient Stated Goal: to regain PLOF Progress towards PT goals: Progressing toward goals    Frequency           PT Plan Current plan remains appropriate    Co-evaluation              AM-PAC PT "6 Clicks" Mobility   Outcome Measure  Help needed turning from your back to your side while in a flat bed without using bedrails?: Total Help needed moving from lying on your back to sitting on the side of a flat bed without using bedrails?: Total Help needed moving to and from a bed to a chair (including a wheelchair)?: Total Help needed standing up from a chair using your arms (e.g., wheelchair or bedside chair)?: Total Help needed to walk in hospital room?: Total Help needed climbing 3-5 steps with a railing? : Total 6 Click Score: 6    End of Session Equipment Utilized During Treatment: Gait belt Activity Tolerance: Patient tolerated treatment well Patient left: in bed;with bed alarm set;with call bell/phone within reach;with family/visitor present Nurse Communication: Mobility status PT Visit Diagnosis: Muscle weakness (generalized) (M62.81);History of falling (Z91.81);Pain;Other abnormalities of gait and mobility (R26.89) Pain - Right/Left: Right     Time: 0865-7846 PT Time Calculation (min) (ACUTE ONLY): 25 min  Charges:  $Gait Training: 8-22 mins $Therapeutic Activity: 8-22 mins                     Rica Koyanagi  PTA Acute  Rehabilitation Services Pager      [phone] Office      [phone]

## 2020-05-04 NOTE — Progress Notes (Signed)
Occupational Therapy Treatment Patient Details Name: Rodney Love MRN: [id number] DOB: 28-Mar-1946 Today's Date: 05/04/2020    History of present illness 75 y.o. male with medical history significant of Parkinson's disease, diabetes, coronary artery disease, hypertension and hyperlipidemia who apparently sustained a mechanical fall at home. Now s/p Right total hip arthroplasty, direct anterior approach.   OT comments  Treatment focused on improving patient's functional mobility in order to prepare for higher level ADLs and reduce caregiver burden. Patient max assist to transfer to side of bed and mod assist for sit to stands from elevated height. t. After initial stand patient became agitated - pushing therapist and stating "Get away from me." After firm encouragement and commands from therapist patient mod assist to stand x 2 at side of bed and mod assist for steps to recliner requiring more of a pivot to complete safely. Patient able to assist with scooting back in chair. Patient assisted with food tray. Patient need skilled therapy services to improve functional deficits and therapist continues to recommend short term rehab at discharge. Family's plan is to take patient home.    Follow Up Recommendations  SNF;Home health OT;Supervision/Assistance - 24 hour    Equipment Recommendations  3 in 1 bedside commode;Wheelchair (measurements OT);Wheelchair cushion (measurements OT);Hospital bed;Other (comment)    Recommendations for Other Services      Precautions / Restrictions Precautions Precautions: Fall Precaution Comments: direct anterior no restrictions/Hx Parkinsons Restrictions Weight Bearing Restrictions: No RLE Weight Bearing: Weight bearing as tolerated       Mobility Bed Mobility Overal bed mobility: Needs Assistance Bed Mobility: Supine to Sit     Supine to sit: Max assist;HOB elevated     General bed mobility comments: Max assist for LLE, trunk lift off and pivot to  side of bed.  Transfers Overall transfer level: Needs assistance Equipment used: Rolling walker (2 wheeled) Transfers: Sit to/from [facility] Sit to Stand: Mod assist;From elevated surface Stand pivot transfers: Mod assist       General transfer comment: Performed sit to stand x 3 at edge of bed - elevated height. On first stand patient leaning heavily to the left. Returned to seated position for safety. On second stand patient mod assist to stand from higher elevated bed but unable to initiate steps. Returned to seated position. Mod assist assist to stand and required tactile cues and mod assist to take short shuffling steps to the recilner with RW. Patient unable to achieve full extension and knees still flexed - and second part of transfer more of a pivot.    Balance Overall balance assessment: Needs assistance Sitting-balance support: Feet supported Sitting balance-Leahy Scale: Fair Sitting balance - Comments: initally min assist to maintain balance. With increased time and improved positioning patient min guard at edge of bed   Standing balance support: During functional activity;Bilateral upper extremity supported Standing balance-Leahy Scale: Poor Standing balance comment: dependent on therapist support and BUE support on walker                           ADL either performed or assessed with clinical judgement   ADL Overall ADL's : Needs assistance/impaired Eating/Feeding: Set up Eating/Feeding Details (indicate cue type and reason): Assisted with tray set up. Grooming: Set up Grooming Details (indicate cue type and reason): Patient drowsy initially. Patient washed face in bed to assist with improving alertness.  Vision Patient Visual Report: No change from baseline     Perception     Praxis      Cognition Arousal/Alertness: Awake/alert Behavior During Therapy: WFL for tasks  assessed/performed Overall Cognitive Status: Within Functional Limits for tasks assessed                                 General Comments: HOH and slightly delayed to process and respond, delayed speech  Hx Parkinsons        Exercises     Shoulder Instructions       General Comments      Pertinent Vitals/ Pain       Pain Assessment: Faces Faces Pain Scale: Hurts a little bit Pain Location: R hip Pain Descriptors / Indicators: Grimacing Pain Intervention(s): Limited activity within patient's tolerance  Home Living                                          Prior Functioning/Environment              Frequency  Min 2X/week        Progress Toward Goals  OT Goals(current goals can now be found in the care plan section)  Progress towards OT goals: Progressing toward goals  Acute Rehab OT Goals Patient Stated Goal: to regain PLOF OT Goal Formulation: With family Time For Goal Achievement: 05/15/20 Potential to Achieve Goals: [place] Discharge plan remains appropriate    Co-evaluation                 AM-PAC OT "6 Clicks" Daily Activity     Outcome Measure   Help from another person eating meals?: A Little Help from another person taking care of personal grooming?: A Little Help from another person toileting, which includes using toliet, bedpan, or urinal?: Total Help from another person bathing (including washing, rinsing, drying)?: A Lot Help from another person to put on and taking off regular upper body clothing?: A Lot Help from another person to put on and taking off regular lower body clothing?: Total 6 Click Score: 12    End of Session Equipment Utilized During Treatment: Gait belt;Rolling walker  OT Visit Diagnosis: Other abnormalities of gait and mobility (R26.89);Muscle weakness (generalized) (M62.81);History of falling (Z91.81);Pain Pain - Right/Left: Right Pain - part of body: Hip   Activity Tolerance  Treatment limited secondary to agitation   Patient Left in chair;with call bell/phone within reach;with chair alarm set;with family/visitor present   Nurse Communication Mobility status        Time: 8101-7510 OT Time Calculation (min): 26 min  Charges: OT General Charges $OT Visit: 1 Visit OT Treatments $Therapeutic Activity: 23-37 mins  Joclynn Lumb, OTR/L [place]  Office [phone] Pager: [place] 05/04/2020, 12:52 PM

## 2020-05-04 NOTE — Progress Notes (Signed)
Physical Therapy Treatment Patient Details Name: Rodney Love MRN: [id number] DOB: Dec 06, 1945 Today's Date: 05/04/2020    History of Present Illness 75 y.o. male with medical history significant of Parkinson's disease, diabetes, coronary artery disease, hypertension and hyperlipidemia who apparently sustained a mechanical fall at home. Now s/p Right total hip arthroplasty, direct anterior approach.    PT Comments    POD # 5 am session Pt was OOB in recliner via OT. General Comments: HOH and slightly delayed to process and respond, delayed speech  Hx Parkinsons.  Assisted with amb.   General transfer comment: + 2 assist to rise due to severe posterior lean and rigidity.  Using gait belt around pt's waist to pull weigh forward as pt stood from recliner.  Severe posterior lean.  Also required assist to control stand to sit. General Gait Details: assisted with amb 16 feet with walker.  Very short shuffled steps with severe posterior lean.  Extra assist to have recliner follow for safety. Then returned to room to perform some TE's mainly AAROM R LE.  Positioned to comfort.      Follow Up Recommendations  CIR;SNF;Home health PT (family declines SNFand  plans to take pt home)     Equipment Recommendations  None recommended by PT    Recommendations for Other Services       Precautions / Restrictions Precautions Precautions: Fall Precaution Comments: direct anterior no restrictions/Hx Parkinsons Restrictions Weight Bearing Restrictions: No RLE Weight Bearing: Weight bearing as tolerated    Mobility  Bed Mobility Overal bed mobility: Needs Assistance Bed Mobility: Supine to Sit     Supine to sit: Max assist;HOB elevated     General bed mobility comments: pt OOB in recliner on arrival  Transfers Overall transfer level: Needs assistance Equipment used: Rolling walker (2 wheeled) Transfers: Sit to/from [facility] Sit to Stand: Mod assist;From elevated surface;Max  assist Stand pivot transfers: Max assist       General transfer comment: + 2 assist to rise due to severe posterior lean and rigidity.  Using gait belt around pt's waist to pull weigh forward as pt stood from recliner.  Severe posterior lean.  Also required assist to control stand to sit.  Ambulation/Gait Ambulation/Gait assistance: Mod assist;+2 safety/equipment;+2 physical assistance Gait Distance (Feet): 16 Feet Assistive device: Rolling walker (2 wheeled) Gait Pattern/deviations: Step-to pattern;Decreased step length - right;Decreased step length - left;Shuffle;Ataxic;Festinating Gait velocity: decreased   General Gait Details: assisted with amb 16 feet with walker.  Very short shuffled steps with severe posterior lean.  Extra assist to have recliner follow for safety.   Stairs             Wheelchair Mobility    Modified Rankin (Stroke Patients Only)       Balance Overall balance assessment: Needs assistance Sitting-balance support: Feet supported Sitting balance-Leahy Scale: Fair Sitting balance - Comments: initally min assist to maintain balance. With increased time and improved positioning patient min guard at edge of bed   Standing balance support: During functional activity;Bilateral upper extremity supported Standing balance-Leahy Scale: Poor Standing balance comment: dependent on therapist support and BUE support on walker                            Cognition Arousal/Alertness: Awake/alert Behavior During Therapy: WFL for tasks assessed/performed Overall Cognitive Status: Within Functional Limits for tasks assessed  General Comments: HOH and slightly delayed to process and respond, delayed speech  Hx Parkinsons      Exercises      General Comments        Pertinent Vitals/Pain Pain Assessment: Faces Faces Pain Scale: Hurts a little bit Pain Location: R hip Pain Descriptors / Indicators:  Grimacing Pain Intervention(s): Monitored during session;Repositioned    Home Living                      Prior Function            PT Goals (current goals can now be found in the care plan section) Acute Rehab PT Goals Patient Stated Goal: to regain PLOF Progress towards PT goals: Progressing toward goals    Frequency           PT Plan Current plan remains appropriate    Co-evaluation              AM-PAC PT "6 Clicks" Mobility   Outcome Measure  Help needed turning from your back to your side while in a flat bed without using bedrails?: Total Help needed moving from lying on your back to sitting on the side of a flat bed without using bedrails?: Total Help needed moving to and from a bed to a chair (including a wheelchair)?: Total Help needed standing up from a chair using your arms (e.g., wheelchair or bedside chair)?: Total Help needed to walk in hospital room?: Total Help needed climbing 3-5 steps with a railing? : Total 6 Click Score: 6    End of Session Equipment Utilized During Treatment: Gait belt Activity Tolerance: Patient tolerated treatment well Patient left: in chair;with call bell/phone within reach;with chair alarm set Nurse Communication: Mobility status PT Visit Diagnosis: Muscle weakness (generalized) (M62.81);History of falling (Z91.81);Pain;Other abnormalities of gait and mobility (R26.89) Pain - Right/Left: Right     Time: 0940-1010 PT Time Calculation (min) (ACUTE ONLY): 30 min  Charges:  $Gait Training: 8-22 mins $Therapeutic Exercise: 8-22 mins                     Rica Koyanagi  PTA Acute  Rehabilitation Services Pager      [phone] Office      [phone]

## 2020-05-05 DIAGNOSIS — S728X1D Other fracture of right femur, subsequent encounter for closed fracture with routine healing: Secondary | ICD-10-CM | POA: Diagnosis not present

## 2020-05-05 DIAGNOSIS — E119 Type 2 diabetes mellitus without complications: Secondary | ICD-10-CM | POA: Diagnosis not present

## 2020-05-05 DIAGNOSIS — I10 Essential (primary) hypertension: Secondary | ICD-10-CM | POA: Diagnosis not present

## 2020-05-05 DIAGNOSIS — I2510 Atherosclerotic heart disease of native coronary artery without angina pectoris: Secondary | ICD-10-CM | POA: Diagnosis not present

## 2020-05-05 LAB — GLUCOSE, CAPILLARY
Glucose-Capillary: 113 mg/dL — ABNORMAL HIGH (ref 70–99)
Glucose-Capillary: 144 mg/dL — ABNORMAL HIGH (ref 70–99)
Glucose-Capillary: 121 mg/dL — ABNORMAL HIGH (ref 70–99)
Glucose-Capillary: 104 mg/dL — ABNORMAL HIGH (ref 70–99)

## 2020-05-05 MED ORDER — SENNA 8.6 MG PO TABS: 1.0000 | ORAL_TABLET | Freq: Two times a day (BID) | ORAL | 0 refills | Status: DC

## 2020-05-05 NOTE — TOC Transition Note (Signed)
Transition of Care [facility]) - CM/SW Discharge Note   Patient Details  Name: Rodney Love MRN: [id number] Date of Birth: 1946-01-31  Transition of Care [facility]) CM/SW Contact:  Lennart Pall, LCSW Phone Number: 05/05/2020, 9:49 AM   Clinical Narrative:    Pt ready for dc home today.  DME currently being delivered to the home:  [facility] lift via [place] and hospital bed via [employer].  HH arranged to restart with [facility].  Once DME confirmed at the house, will call [facility] for transport.  Of note, family has also arranged for private duty caregiver support via [place].  No further TOC needs.   Final next level of care: [place] Barriers to Discharge: Barriers Resolved   Patient Goals and CMS Choice Patient states their goals for this hospitalization and ongoing recovery are:: pt/ family goals are for pt to be able to dc home CMS Medicare.gov Compare Post Acute Care list provided to:: Patient Choice offered to / list presented to : [facility]  Discharge Placement                       Discharge Plan and Services In-house Referral: Clinical Social Work              DME Arranged: Hospital bed,Other see comment (hoyer lift) DME Agency: [facility]) Date DME Agency Contacted: 05/03/20   Representative spoke with at DME Agency: [employer] Freda Munro;   [employer] [facility] [place] HH Arranged: RN,PT,OT [place] Agency: [place] Date HH Agency Contacted: 05/03/20   Representative spoke with at [place]: [place] ([place]) Interventions     Readmission Risk Interventions Readmission Risk Prevention Plan 05/01/2020  Transportation Screening Complete  PCP or Specialist Appt within 5-7 Days Complete  Home Care Screening Complete  Medication Review (RN CM) Complete  Some recent data might be hidden

## 2020-05-05 NOTE — Progress Notes (Signed)
Physical Therapy Treatment Patient Details Name: Rodney Love MRN: [id number] DOB: [birth year] Today's Date: 05/05/2020    History of Present Illness 75 y.o. male with medical history significant of Parkinson's disease, diabetes, coronary artery disease, hypertension and hyperlipidemia who apparently sustained a mechanical fall at home. Now s/p Right total hip arthroplasty, direct anterior approach.    PT Comments    Pt progressing slowly. Will need HHPT at d/c. Will need w/c in addition to hospital bed and lift.    Follow Up Recommendations  Home health PT;Supervision/Assistance - 24 hour (refuses SNF)     Equipment Recommendations  None recommended by PT    Recommendations for Other Services       Precautions / Restrictions Precautions Precautions: Fall Restrictions RLE Weight Bearing: Weight bearing as tolerated    Mobility  Bed Mobility Overal bed mobility: Needs Assistance Bed Mobility: Sidelying to Sit;Rolling Rolling: Mod assist Sidelying to sit: Min assist;Mod assist       General bed mobility comments: incr time, hand over hand and multi-modal cues for rolling, technique, sequence  Transfers Overall transfer level: Needs assistance Equipment used: Rolling walker (2 wheeled) Transfers: Sit to/from [employer] Sit to Stand: Mod assist;+2 safety/equipment;From elevated surface   Squat pivot transfers: Min assist;Mod assist;+2 physical assistance;+2 safety/equipment     General transfer comment: repeated sit<>stands for improved muscle memory, pt with incr self assist on each subsequent trial. emphasis on knee/hip forward trunk flexion. max visual and tactile cues for squat pivot, assist with anterior wt shift and safe turn to chair  Ambulation/Gait                 Stairs             Wheelchair Mobility    Modified Rankin (Stroke Patients Only)       Balance     Sitting balance-Leahy Scale: Fair       Standing  balance-Leahy Scale: Poor Standing balance comment: dependent on external assist                            Cognition Arousal/Alertness: Awake/alert Behavior During Therapy: WFL for tasks assessed/performed Overall Cognitive Status: Within Functional Limits for tasks assessed                                 General Comments: HOH and slightly delayed to process and respond, delayed speech  Hx Parkinsons      Exercises General Exercises - Lower Extremity Heel Slides: Right;Left;Supine;AAROM;5 reps    General Comments        Pertinent Vitals/Pain Pain Assessment: 0-10 Faces Pain Scale: Hurts a little bit Pain Location: R hip Pain Descriptors / Indicators: Grimacing Pain Intervention(s): Limited activity within patient's tolerance;Monitored during session;Premedicated before session    Home Living                      Prior Function            PT Goals (current goals can now be found in the care plan section) Acute Rehab PT Goals Patient Stated Goal: to regain PLOF PT Goal Formulation: With patient/family Time For Goal Achievement: 05/14/20 Potential to Achieve Goals: Good Progress towards PT goals: Progressing toward goals    Frequency    Min 3X/week      PT Plan Current plan remains appropriate  Co-evaluation              AM-PAC PT "6 Clicks" Mobility   Outcome Measure  Help needed turning from your back to your side while in a flat bed without using bedrails?: A Lot Help needed moving from lying on your back to sitting on the side of a flat bed without using bedrails?: A Lot Help needed moving to and from a bed to a chair (including a wheelchair)?: A Lot Help needed standing up from a chair using your arms (e.g., wheelchair or bedside chair)?: A Lot Help needed to walk in hospital room?: Total Help needed climbing 3-5 steps with a railing? : Total 6 Click Score: 10    End of Session Equipment Utilized During  Treatment: Gait belt Activity Tolerance: Patient tolerated treatment well Patient left: in chair;with call bell/phone within reach;with chair alarm set Nurse Communication: Mobility status PT Visit Diagnosis: Muscle weakness (generalized) (M62.81);History of falling (Z91.81);Pain;Other abnormalities of gait and mobility (R26.89) Pain - Right/Left: Right Pain - part of body: Hip     Time: 1607-3710 PT Time Calculation (min) (ACUTE ONLY): 23 min  Charges:  $Therapeutic Activity: 23-37 mins                     Baxter Flattery, PT  Acute Rehab Dept ([place]) [phone] Pager [phone]  05/05/2020    [facility] 05/05/2020, 10:21 AM

## 2020-05-05 NOTE — Discharge Summary (Signed)
Physician Discharge Summary  ESSEX PERRY YQM:[id number] DOB: 11-21-1945 DOA: 04/28/2020  PCP: Lujean Amel, MD  Admit date: 04/28/2020 Discharge date: 05/05/2020  Time spent: 55 minutes  Recommendations for Outpatient Follow-up:  1. Follow-up with Dr. Lyla Glassing, orthopedics in 2 weeks. 2. Follow-up with Koirala, Dibas, MD in 2 to 3 weeks.  On follow-up patient will need a basic metabolic profile done to follow-up on electrolytes and renal function. 3. Patient was discharged home with home health. 4. Outpatient follow-up with palliative care services.   Discharge Diagnoses:  Principal Problem:   Femur fracture, right ([place]) Active Problems:   PAD (peripheral artery disease) s/p remote left external iliac artery angioplasty   Coronary atherosclerosis   Hypercholesterolemia   Essential hypertension   Diabetes mellitus type 2 in nonobese [facility])   Carotid occlusion, right   PD (Parkinson's disease) ([place])   Pressure injury of skin   Discharge Condition: Stable and improved  Diet recommendation: Carb modified diet  Filed Weights   04/28/20 1513  Weight: 74.4 kg    History of present illness:  HPI per Dr. Alfonso Ramus Blevinsis a 74 y.o.malewith medical history significant ofParkinson's disease, diabetes, coronary artery disease, hypertension and hyperlipidemia who apparently sustained a mechanical fall at home. Patient was here with his wife who reported the fall about 5 days ago. Since then he had been having right-sided hip pain and right foot pain. Did not lose consciousness. Did not have any other area of pain or injury. Patient came to the ER where he was evaluated. He was found to have right femoral neck fracture. Surgery consulted and patient admitted to the hospital with plan for surgery in the morning. He was hemodynamically stable otherwise. Patient was having 10 out of 10 pain on arrival but currently down to 4 out of 10. Patient admitted to the medical service  for further evaluation and treatment..  ED Course:Temperature 98.1 blood pressure 132/72 pulse 148 respirate of 18 oxygen sat 95% on room air. White count 8.1 hemoglobin 14.3 platelet 162. Potassium is 3.2 otherwise chemistry appears to be within normal. Chest x-ray showed no acute findings x-ray of the knee showed no significant findings.  X-ray of the hip showed right femoral neck fracture.  Orthopedics consulted and patient admitted for further evaluation and treatment.  Hospital Course:  1 right femur fracture Secondary to mechanical fall.  Patient was seen in consultation by orthopedics and underwent right hip arthroplasty.  PT/OT assessed patient and recommended SNF versus CIR.  Family prefered to take patient home with home health therapies to follow as well as palliative care on discharge.  Dr. Karleen Hampshire discussed with patient's daughter and suggested that patient is high fall risk.  It was noted that daughter would like a hospital bed and [facility] lift bed to be delivered prior to discharge. Patient was discharged home in stable and improved condition.  Outpatient follow-up with orthopedics.   2.  Parkinson's disease Continued on home regimen Sinemet.  Outpatient follow-up.    3.  Hypertension Stable.  Follow.    4.  Well-controlled type 2 diabetes mellitus Hemoglobin A1c 5.5 (05/01/2020).    Patient maintained on home regimen Metformin.  Outpatient follow-up.  5.  Constipation Resolved.  Patient placed on stool softener twice daily which will be discharged home on.  Outpatient follow-up with PCP.  6.  Hyperlipidemia Patient maintained on statin.    7.  Hypokalemia Repleted.   8.  History of coronary artery disease Stable.    Patient  maintained on statin and aspirin.  Outpatient follow-up.  9.  Mild thrombocytopenia Stable.  No bleeding.  Outpatient follow-up.   10. stage III pressure injury to the coccyx Patient seen by wound care and underwent dressing change and  recommendations.  Outpatient follow-up. Pressure Injury 05/02/20 Sacrum Stage 2 -  Partial thickness loss of dermis presenting as a shallow open injury with a red, pink wound bed without slough. (Active)  05/02/20 (Pt admitted on 1/29 and stage 1 was documented on initial assessment) 1830  Location: Sacrum  Location Orientation:   Staging: Stage 2 -  Partial thickness loss of dermis presenting as a shallow open injury with a red, pink wound bed without slough.  Wound Description (Comments):   Present on Admission:       Procedures:  CT head/ CT C-spine 04/28/2020  Plain films right foot 04/28/2020  Chest x-ray 04/28/2020  Plain films of the right knee 04/28/2020  Plain films of the pelvis 04/29/2020  Plain films of the right hip and pelvis 04/28/2020  Right total hip arthroplasty per Dr. Lyla Glassing 04/29/2020   Consultations:  Orthopedics: Dr. Lyla Glassing 04/29/2020  Wound care 05/03/2020    Discharge Exam: Vitals:   05/05/20 0500 05/05/20 0556  BP: 136/71 136/71  Pulse: 67 67  Resp: 17 17  Temp: 98.6 F (37 C)   SpO2: 97% 97%    General: NAD Cardiovascular: RRR Respiratory: CTAB  Discharge Instructions   Discharge Instructions    Diet Carb Modified   Complete by: As directed    Discharge wound care:   Complete by: As directed    As above.   Increase activity slowly   Complete by: As directed      Allergies as of 05/05/2020   No Known Allergies     Medication List    TAKE these medications   acetaminophen 500 MG tablet Commonly known as: TYLENOL Take 500 mg by mouth every 6 (six) hours as needed for moderate pain.   aspirin 81 MG EC tablet Take 1 tablet (81 mg total) by mouth 2 (two) times daily with a meal. Swallow whole. What changed:   when to take this  additional instructions   atorvastatin 20 MG tablet Commonly known as: LIPITOR Take 20 mg by mouth daily.   carbidopa-levodopa 50-200 MG tablet Commonly known as: SINEMET CR TAKE 1 TABLET  BY MOUTH AT BEDTIME What changed: Another medication with the same name was changed. Make sure you understand how and when to take each.   Carbidopa-Levodopa ER 25-100 MG tablet controlled release Commonly known as: SINEMET CR TAKE 1 TABLET BY MOUTH EVERY MORNING, AT NOON, IN THE EVENING. AND AT BEDTIME What changed: See the new instructions.   cholecalciferol 25 MCG (1000 UNIT) tablet Commonly known as: VITAMIN D3 Take 1,000 Units by mouth daily.   ferrous sulfate 324 MG Tbec Take 324 mg by mouth daily at 6 (six) AM.   HYDROcodone-acetaminophen 5-325 MG tablet Commonly known as: NORCO/VICODIN Take 1-2 tablets by mouth every 4 (four) hours as needed for up to 7 days for moderate pain (pain score 4-6).   metFORMIN 850 MG tablet Commonly known as: GLUCOPHAGE Take 850 mg by mouth 3 (three) times a week. Mon/Wed/Fri   OneTouch Ultra test strip Generic drug: glucose blood   QUEtiapine 25 MG tablet Commonly known as: SEROquel 1/2 in the AM and 1 at bed.  May add another 1/2 tablet in the late afternoon if need What changed:   how much to  take  how to take this  when to take this  additional instructions   senna 8.6 MG Tabs tablet Commonly known as: SENOKOT Take 1 tablet (8.6 mg total) by mouth 2 (two) times daily.   VITAMIN B 12 PO Take 1 tablet by mouth daily.            Durable Medical Equipment  (From admission, onward)         Start     Ordered   05/03/20 1247  For home use only DME Hospital bed  Once       Question Answer Comment  Length of Need Lifetime   The above medical condition requires: Patient requires the ability to reposition frequently   Bed type Semi-electric   Hoyer Lift Yes   Support Surface: Gel Overlay      05/03/20 1247   05/03/20 0743  For home use only DME lightweight manual wheelchair with seat cushion  Once       Comments: Patient suffers from right femur fracture which impairs their ability to perform daily activities in the  home.  A walking aid will not resolve  issue with performing activities of daily living. A wheelchair will allow patient to safely perform daily activities. Patient is not able to propel themselves in the home using a standard weight wheelchair due to weakness. Patient can self propel in the lightweight wheelchair. Length of need 6 months. Accessories: elevating leg rests (ELRs), wheel locks, extensions and anti-tippers.   05/03/20 0743   05/03/20 0742  For home use only DME 3 n 1  Once        05/03/20 0741           Discharge Care Instructions  (From admission, onward)         Start     Ordered   05/05/20 0000  Discharge wound care:       Comments: As above.   05/05/20 1239         No Known Allergies  Follow-up Information    Swinteck, Aaron Edelman, MD. Schedule an appointment as soon as possible for a visit in 2 weeks.   Specialty: Orthopedic Surgery Why: For suture removal Contact information: [address] [place] [address] [phone]        [facility] [place] Follow up.   Why: to provide home health therapies and nursing  Contact information: [place] [address] [phone]        Lujean Amel, MD. Schedule an appointment as soon as possible for a visit in 2 week(s).   Specialty: Family Medicine Why: f/u in 2-3 weeks. Contact information: [place] [address] [phone]                The results of significant diagnostics from this hospitalization (including imaging, microbiology, ancillary and laboratory) are listed below for reference.    Significant Diagnostic Studies: DG Chest 1 View  Result Date: 04/28/2020 CLINICAL DATA:  Preop EXAM: CHEST  1 VIEW COMPARISON:  09/03/2019 FINDINGS: Loop recorder device projects over the left chest. Heart and mediastinal contours are within normal limits. No focal opacities or effusions. No acute bony abnormality.  IMPRESSION: No active disease. Electronically Signed   By: Rolm Baptise M.D.   On: 04/28/2020 18:21   CT Head Wo Contrast  Result Date: 04/28/2020 CLINICAL DATA:  Mechanical fall 3 days ago, head trauma. EXAM: CT HEAD WITHOUT CONTRAST TECHNIQUE: Contiguous axial  images were obtained from the base of the skull through the vertex without intravenous contrast. COMPARISON:  None. FINDINGS: Brain: The brainstem, cerebellum, cerebral peduncles, thalami, basal ganglia, basilar cisterns, and ventricular system appear within normal limits. Periventricular white matter and corona radiata hypodensities favor chronic ischemic microvascular white matter disease. No intracranial hemorrhage, mass lesion, or acute CVA. Vascular: There is atherosclerotic calcification of the cavernous carotid arteries bilaterally. Skull: Unremarkable Sinuses/Orbits: Chronic bilateral maxillary sinusitis with a superimposed air-level in the left maxillary sinus compatible with acute left maxillary sinusitis. Other: No supplemental non-categorized findings. IMPRESSION: 1. No acute intracranial findings. 2. Periventricular white matter and corona radiata hypodensities favor chronic ischemic microvascular white matter disease. 3. Chronic bilateral maxillary sinusitis with a superimposed air-level in the left maxillary sinus compatible with acute left maxillary sinusitis. Electronically Signed   By: Van Clines M.D.   On: 04/28/2020 16:21   CT Cervical Spine Wo Contrast  Result Date: 04/28/2020 CLINICAL DATA:  Fall 3 days ago, neck trauma. EXAM: CT CERVICAL SPINE WITHOUT CONTRAST TECHNIQUE: Multidetector CT imaging of the cervical spine was performed without intravenous contrast. Multiplanar CT image reconstructions were also generated. COMPARISON:  CT cervical spine 11/11/2019 FINDINGS: Alignment: Mild reversal of the normal cervical lordosis. No subluxation identified. Skull base and vertebrae: Spurring at the anterior C1-2 articulation.  No cervical spine fracture or acute subluxation. No acute bony findings are observed. Soft tissues and spinal canal: Bilateral common carotid atherosclerotic calcification. Disc levels: Multilevel uncinate and facet spurring potentially with mild right foraminal impingement at C3-4. Loss of intervertebral disc height at multiple levels, most strikingly at C5-6 and C6-7. Upper chest: Biapical pleuroparenchymal scarring. Suspected emphysema. Other: No supplemental non-categorized findings. IMPRESSION: 1. No acute cervical spine findings. 2. Cervical spondylosis and degenerative disc disease. 3. Bilateral common carotid atherosclerotic calcification. 4. Suspected emphysema. Emphysema (ICD10-J43.9). Electronically Signed   By: Van Clines M.D.   On: 04/28/2020 16:26   Pelvis Portable  Result Date: 04/29/2020 CLINICAL DATA:  Status post right hip arthroplasty. EXAM: PORTABLE PELVIS 1-2 VIEWS COMPARISON:  09/03/2019 and right hip C-arm images obtained earlier today. FINDINGS: Right hip bipolar prosthesis in satisfactory position and alignment. No fracture or dislocation seen. Atheromatous arterial calcifications. Right pelvic surgical clips. Diffuse osteopenia. IMPRESSION: Satisfactory appearance of a right hip bipolar prosthesis. Electronically Signed   By: Claudie Revering M.D.   On: 04/29/2020 14:59   DG Knee Complete 4 Views Right  Result Date: 04/28/2020 CLINICAL DATA:  Fall, right hip fracture EXAM: RIGHT KNEE - COMPLETE 4+ VIEW COMPARISON:  None. FINDINGS: No evidence of fracture, dislocation, or joint effusion. No evidence of arthropathy or other focal bone abnormality. Soft tissues are unremarkable. IMPRESSION: Negative. Electronically Signed   By: Rolm Baptise M.D.   On: 04/28/2020 18:21   DG Foot Complete Right  Result Date: 04/28/2020 CLINICAL DATA:  Right foot pain.  Fall 3 days ago. EXAM: RIGHT FOOT COMPLETE - 3+ VIEW COMPARISON:  None. FINDINGS: No convincing fracture.  No bone lesion. Joints  are normally aligned. Small dorsal and plantar calcaneal spurs. Soft tissues are unremarkable. IMPRESSION: No fracture or dislocation. Electronically Signed   By: Lajean Manes M.D.   On: 04/28/2020 16:02   DG C-Arm 1-60 Min-No Report  Result Date: 04/29/2020 CLINICAL DATA:  75 year old male with a history of right hip arthroplasty EXAM: OPERATIVE RIGHT HIP (WITH PELVIS IF PERFORMED) 2 VIEWS TECHNIQUE: Fluoroscopic spot image(s) were submitted for interpretation post-operatively. COMPARISON:  04/28/2020 FINDINGS: Limited intraoperative fluoroscopic spot images  of the right hip demonstrating right hip arthroplasty for fracture fixation. Components are aligned. IMPRESSION: Limited intraoperative fluoroscopic spot images of right hip arthroplasty. Please refer to the dictated operative report for full details of intraoperative findings and procedure. Electronically Signed   By: Corrie Mckusick D.O.   On: 04/29/2020 14:00   CUP PACEART REMOTE DEVICE CHECK  Result Date: 04/17/2020 ILR summary report received. Battery status OK. Normal device function. No new brady, or pause episodes. No new AF episodes. 13 tachy & 3 AF events w/ ECGs suggesting artifact. Known issue - patient has tremors. 1 symptom event w/ ECG suggesting SR.  Monthly summary reports and ROV/PRN. HB  DG HIP OPERATIVE UNILAT W OR W/O PELVIS RIGHT  Result Date: 04/29/2020 CLINICAL DATA:  75 year old male with a history of right hip arthroplasty EXAM: OPERATIVE RIGHT HIP (WITH PELVIS IF PERFORMED) 2 VIEWS TECHNIQUE: Fluoroscopic spot image(s) were submitted for interpretation post-operatively. COMPARISON:  04/28/2020 FINDINGS: Limited intraoperative fluoroscopic spot images of the right hip demonstrating right hip arthroplasty for fracture fixation. Components are aligned. IMPRESSION: Limited intraoperative fluoroscopic spot images of right hip arthroplasty. Please refer to the dictated operative report for full details of intraoperative findings  and procedure. Electronically Signed   By: Corrie Mckusick D.O.   On: 04/29/2020 14:00   DG Hip Unilat With Pelvis 2-3 Views Right  Result Date: 04/28/2020 CLINICAL DATA:  Right hip pain after fall 3 days ago. EXAM: DG HIP (WITH OR WITHOUT PELVIS) 2-3V RIGHT COMPARISON:  None. FINDINGS: Displaced right femoral neck fracture with proximal migration of the femoral shaft. No significant angulation. The femoral head remains seated. Mild acetabular spurring. Pubic rami are intact. Bones are diffusely under mineralized. Soft tissue edema is noted laterally. There are vascular calcifications. IMPRESSION: Displaced right femoral neck fracture. Electronically Signed   By: Keith Rake M.D.   On: 04/28/2020 16:03    Microbiology: Recent Results (from the past 240 hour(s))  SARS Coronavirus 2 by RT PCR (hospital order, performed in [facility] hospital lab) Nasopharyngeal Nasopharyngeal Swab     Status: None   Collection Time: 04/28/20  4:07 PM   Specimen: Nasopharyngeal Swab  Result Value Ref Range Status   SARS Coronavirus 2 NEGATIVE NEGATIVE Final    Comment: (NOTE) SARS-CoV-2 target nucleic acids are NOT DETECTED.  The SARS-CoV-2 RNA is generally detectable in upper and lower respiratory specimens during the acute phase of infection. The lowest concentration of SARS-CoV-2 viral copies this assay can detect is 250 copies / mL. A negative result does not preclude SARS-CoV-2 infection and should not be used as the sole basis for treatment or other patient management decisions.  A negative result may occur with improper specimen collection / handling, submission of specimen other than nasopharyngeal swab, presence of viral mutation(s) within the areas targeted by this assay, and inadequate number of viral copies (<250 copies / mL). A negative result must be combined with clinical observations, patient history, and epidemiological information.  Fact Sheet for Patients:    StrictlyIdeas.no  Fact Sheet for Healthcare Providers: BankingDealers.co.za  This test is not yet approved or  cleared by the [place] FDA and has been authorized for detection and/or diagnosis of SARS-CoV-2 by FDA under an Emergency Use Authorization (EUA).  This EUA will remain in effect (meaning this test can be used) for the duration of the COVID-19 declaration under Section 564(b)(1) of the Act, 21 U.S.C. section 360bbb-3(b)(1), unless the authorization is terminated or revoked sooner.  Performed at [facility]  [place] [address] [place], [place] [address]      Labs: Basic Metabolic Panel: Recent Labs  Lab 04/28/20 1607 04/29/20 0425 04/30/20 0550 05/01/20 0559  NA 139 142 138 139  K 3.2* 3.4* 3.4* 3.7  CL 104 110 105 110  CO2 26 24 23 23   GLUCOSE 150* 102* 120* 165*  BUN 11 14 19 15   CREATININE 0.84 0.82 0.89 0.71  CALCIUM 9.0 8.6* 8.1* 7.8*   Liver Function Tests: Recent Labs  Lab 04/28/20 1607 04/29/20 0425  AST 16 14*  ALT 12 17  ALKPHOS 84 70  BILITOT 1.8* 1.6*  PROT 7.3 6.1*  ALBUMIN 3.8 3.1*   No results for input(s): LIPASE, AMYLASE in the last 168 hours. No results for input(s): AMMONIA in the last 168 hours. CBC: Recent Labs  Lab 04/28/20 1607 04/29/20 0425 04/30/20 0550 05/01/20 0559 05/02/20 0518  WBC 8.1 6.7 9.9 6.2 6.2  NEUTROABS 6.1  --   --   --   --   HGB 14.3 12.6* 10.9* 9.2* 10.0*  HCT 43.6 38.6* 32.8* 27.9* 31.0*  MCV 97.3 98.0 95.9 97.2 97.8  PLT 162 152 150 134* 143*   Cardiac Enzymes: No results for input(s): CKTOTAL, CKMB, CKMBINDEX, TROPONINI in the last 168 hours. BNP: BNP (last 3 results) No results for input(s): BNP in the last 8760 hours.  ProBNP (last 3 results) No results for input(s): PROBNP in the last 8760 hours.  CBG: Recent Labs  Lab 05/04/20 1200 05/04/20 1709 05/04/20 2150 05/05/20 0726 05/05/20 1129  GLUCAP 214* 101* 165*  113* 144*       Signed:  Irine Seal MD.  [facility] 05/05/2020, 12:49 PM

## 2020-05-06 NOTE — Progress Notes (Signed)
[facility] here to transport patient to private residence. Patient is stable, wife was at bedside with patient. Rodney Love

## 2020-05-08 ENCOUNTER — Telehealth: Payer: Self-pay

## 2020-05-08 NOTE — Telephone Encounter (Signed)
ILR implanted for syncope, alert received for questionable AF/AFL event with duration of 15 hours. Patient has hx of parkinsons and recently fell was admitted to hospital 04/28/20 w/ result in right hip fx. Not sure if patient would be candidate for [facility] or not. Routing to Dr. Sallyanne Kuster for review and any recommendations.

## 2020-05-08 NOTE — Telephone Encounter (Signed)
It's always been difficult to make rhythm diagnoses in Rodney Love's case because of the artifact from the Parkinson's disease, but I think this was indeed a 16-hour episode of paroxysmal atrial fibrillation. This event occurred during hospitalization for hip fracture.  He had had surgery a couple of days earlier.  Was not in a telemetry monitored bed. Rate control was not too bad with a median ventricular rate of 105 bpm.  I do not think he needs additional medications. He is not a great candidate for anticoagulation.  If he had been on anticoagulants just a few days earlier, he may have had more complications from his fall and hip fracture. It is possible that the arrhythmia itself was related to the hyperadrenergic state following his injury, surgery, pain from the fracture, etc. The loop recorder was implanted for syncope, not for atrial fibrillation or history of stroke.  I think the best course of action is to continue monitoring his device.  He has a follow-up appointment with me in the summer.  If between now and then he does not have additional episodes of atrial fibrillation, I would recommend against anticoagulation. Discussed these considerations with Rodney Love's daughter Rodney Love by phone.  She agrees with this course of action.

## 2020-05-09 DIAGNOSIS — I6521 Occlusion and stenosis of right carotid artery: Secondary | ICD-10-CM | POA: Diagnosis not present

## 2020-05-09 DIAGNOSIS — Z96641 Presence of right artificial hip joint: Secondary | ICD-10-CM | POA: Diagnosis not present

## 2020-05-09 DIAGNOSIS — Z471 Aftercare following joint replacement surgery: Secondary | ICD-10-CM | POA: Diagnosis not present

## 2020-05-09 DIAGNOSIS — Z9181 History of falling: Secondary | ICD-10-CM | POA: Diagnosis not present

## 2020-05-09 DIAGNOSIS — Z7984 Long term (current) use of oral hypoglycemic drugs: Secondary | ICD-10-CM | POA: Diagnosis not present

## 2020-05-09 DIAGNOSIS — E785 Hyperlipidemia, unspecified: Secondary | ICD-10-CM | POA: Diagnosis not present

## 2020-05-09 DIAGNOSIS — G20 Parkinson's disease: Secondary | ICD-10-CM | POA: Diagnosis not present

## 2020-05-09 DIAGNOSIS — E1151 Type 2 diabetes mellitus with diabetic peripheral angiopathy without gangrene: Secondary | ICD-10-CM | POA: Diagnosis not present

## 2020-05-09 DIAGNOSIS — L89153 Pressure ulcer of sacral region, stage 3: Secondary | ICD-10-CM | POA: Diagnosis not present

## 2020-05-09 DIAGNOSIS — Z87891 Personal history of nicotine dependence: Secondary | ICD-10-CM | POA: Diagnosis not present

## 2020-05-09 DIAGNOSIS — E559 Vitamin D deficiency, unspecified: Secondary | ICD-10-CM | POA: Diagnosis not present

## 2020-05-09 DIAGNOSIS — E611 Iron deficiency: Secondary | ICD-10-CM | POA: Diagnosis not present

## 2020-05-09 DIAGNOSIS — G2581 Restless legs syndrome: Secondary | ICD-10-CM | POA: Diagnosis not present

## 2020-05-09 DIAGNOSIS — I771 Stricture of artery: Secondary | ICD-10-CM | POA: Diagnosis not present

## 2020-05-10 DIAGNOSIS — E611 Iron deficiency: Secondary | ICD-10-CM | POA: Diagnosis not present

## 2020-05-10 DIAGNOSIS — E1151 Type 2 diabetes mellitus with diabetic peripheral angiopathy without gangrene: Secondary | ICD-10-CM | POA: Diagnosis not present

## 2020-05-10 DIAGNOSIS — L89153 Pressure ulcer of sacral region, stage 3: Secondary | ICD-10-CM | POA: Diagnosis not present

## 2020-05-10 DIAGNOSIS — Z471 Aftercare following joint replacement surgery: Secondary | ICD-10-CM | POA: Diagnosis not present

## 2020-05-10 DIAGNOSIS — I6521 Occlusion and stenosis of right carotid artery: Secondary | ICD-10-CM | POA: Diagnosis not present

## 2020-05-10 DIAGNOSIS — Z7984 Long term (current) use of oral hypoglycemic drugs: Secondary | ICD-10-CM | POA: Diagnosis not present

## 2020-05-10 DIAGNOSIS — G2581 Restless legs syndrome: Secondary | ICD-10-CM | POA: Diagnosis not present

## 2020-05-10 DIAGNOSIS — E785 Hyperlipidemia, unspecified: Secondary | ICD-10-CM | POA: Diagnosis not present

## 2020-05-10 DIAGNOSIS — I771 Stricture of artery: Secondary | ICD-10-CM | POA: Diagnosis not present

## 2020-05-10 DIAGNOSIS — Z9181 History of falling: Secondary | ICD-10-CM | POA: Diagnosis not present

## 2020-05-10 DIAGNOSIS — Z87891 Personal history of nicotine dependence: Secondary | ICD-10-CM | POA: Diagnosis not present

## 2020-05-10 DIAGNOSIS — G20 Parkinson's disease: Secondary | ICD-10-CM | POA: Diagnosis not present

## 2020-05-10 DIAGNOSIS — Z96641 Presence of right artificial hip joint: Secondary | ICD-10-CM | POA: Diagnosis not present

## 2020-05-10 DIAGNOSIS — E559 Vitamin D deficiency, unspecified: Secondary | ICD-10-CM | POA: Diagnosis not present

## 2020-05-10 DIAGNOSIS — R296 Repeated falls: Secondary | ICD-10-CM | POA: Diagnosis not present

## 2020-05-11 DIAGNOSIS — Z87891 Personal history of nicotine dependence: Secondary | ICD-10-CM | POA: Diagnosis not present

## 2020-05-11 DIAGNOSIS — L89153 Pressure ulcer of sacral region, stage 3: Secondary | ICD-10-CM | POA: Diagnosis not present

## 2020-05-11 DIAGNOSIS — E785 Hyperlipidemia, unspecified: Secondary | ICD-10-CM | POA: Diagnosis not present

## 2020-05-11 DIAGNOSIS — E559 Vitamin D deficiency, unspecified: Secondary | ICD-10-CM | POA: Diagnosis not present

## 2020-05-11 DIAGNOSIS — E611 Iron deficiency: Secondary | ICD-10-CM | POA: Diagnosis not present

## 2020-05-11 DIAGNOSIS — E1151 Type 2 diabetes mellitus with diabetic peripheral angiopathy without gangrene: Secondary | ICD-10-CM | POA: Diagnosis not present

## 2020-05-11 DIAGNOSIS — G20 Parkinson's disease: Secondary | ICD-10-CM | POA: Diagnosis not present

## 2020-05-11 DIAGNOSIS — G2581 Restless legs syndrome: Secondary | ICD-10-CM | POA: Diagnosis not present

## 2020-05-11 DIAGNOSIS — Z7984 Long term (current) use of oral hypoglycemic drugs: Secondary | ICD-10-CM | POA: Diagnosis not present

## 2020-05-11 DIAGNOSIS — Z471 Aftercare following joint replacement surgery: Secondary | ICD-10-CM | POA: Diagnosis not present

## 2020-05-11 DIAGNOSIS — Z96641 Presence of right artificial hip joint: Secondary | ICD-10-CM | POA: Diagnosis not present

## 2020-05-11 DIAGNOSIS — I771 Stricture of artery: Secondary | ICD-10-CM | POA: Diagnosis not present

## 2020-05-11 DIAGNOSIS — Z9181 History of falling: Secondary | ICD-10-CM | POA: Diagnosis not present

## 2020-05-11 DIAGNOSIS — I6521 Occlusion and stenosis of right carotid artery: Secondary | ICD-10-CM | POA: Diagnosis not present

## 2020-05-12 DIAGNOSIS — E611 Iron deficiency: Secondary | ICD-10-CM | POA: Diagnosis not present

## 2020-05-12 DIAGNOSIS — Z9181 History of falling: Secondary | ICD-10-CM | POA: Diagnosis not present

## 2020-05-12 DIAGNOSIS — E1151 Type 2 diabetes mellitus with diabetic peripheral angiopathy without gangrene: Secondary | ICD-10-CM | POA: Diagnosis not present

## 2020-05-12 DIAGNOSIS — I771 Stricture of artery: Secondary | ICD-10-CM | POA: Diagnosis not present

## 2020-05-12 DIAGNOSIS — Z87891 Personal history of nicotine dependence: Secondary | ICD-10-CM | POA: Diagnosis not present

## 2020-05-12 DIAGNOSIS — G2581 Restless legs syndrome: Secondary | ICD-10-CM | POA: Diagnosis not present

## 2020-05-12 DIAGNOSIS — L89153 Pressure ulcer of sacral region, stage 3: Secondary | ICD-10-CM | POA: Diagnosis not present

## 2020-05-12 DIAGNOSIS — G20 Parkinson's disease: Secondary | ICD-10-CM | POA: Diagnosis not present

## 2020-05-12 DIAGNOSIS — Z96641 Presence of right artificial hip joint: Secondary | ICD-10-CM | POA: Diagnosis not present

## 2020-05-12 DIAGNOSIS — E559 Vitamin D deficiency, unspecified: Secondary | ICD-10-CM | POA: Diagnosis not present

## 2020-05-12 DIAGNOSIS — Z471 Aftercare following joint replacement surgery: Secondary | ICD-10-CM | POA: Diagnosis not present

## 2020-05-12 DIAGNOSIS — I6521 Occlusion and stenosis of right carotid artery: Secondary | ICD-10-CM | POA: Diagnosis not present

## 2020-05-12 DIAGNOSIS — Z7984 Long term (current) use of oral hypoglycemic drugs: Secondary | ICD-10-CM | POA: Diagnosis not present

## 2020-05-12 DIAGNOSIS — E785 Hyperlipidemia, unspecified: Secondary | ICD-10-CM | POA: Diagnosis not present

## 2020-05-15 ENCOUNTER — Other Ambulatory Visit: Payer: Self-pay

## 2020-05-15 ENCOUNTER — Other Ambulatory Visit: Payer: Medicare Other | Admitting: Hospice

## 2020-05-15 ENCOUNTER — Other Ambulatory Visit: Payer: Self-pay | Admitting: Neurology

## 2020-05-15 DIAGNOSIS — I6521 Occlusion and stenosis of right carotid artery: Secondary | ICD-10-CM | POA: Diagnosis not present

## 2020-05-15 DIAGNOSIS — Z9181 History of falling: Secondary | ICD-10-CM | POA: Diagnosis not present

## 2020-05-15 DIAGNOSIS — Z7984 Long term (current) use of oral hypoglycemic drugs: Secondary | ICD-10-CM | POA: Diagnosis not present

## 2020-05-15 DIAGNOSIS — G2581 Restless legs syndrome: Secondary | ICD-10-CM | POA: Diagnosis not present

## 2020-05-15 DIAGNOSIS — M25551 Pain in right hip: Secondary | ICD-10-CM

## 2020-05-15 DIAGNOSIS — E785 Hyperlipidemia, unspecified: Secondary | ICD-10-CM | POA: Diagnosis not present

## 2020-05-15 DIAGNOSIS — Z471 Aftercare following joint replacement surgery: Secondary | ICD-10-CM | POA: Diagnosis not present

## 2020-05-15 DIAGNOSIS — G20 Parkinson's disease: Secondary | ICD-10-CM

## 2020-05-15 DIAGNOSIS — Z87891 Personal history of nicotine dependence: Secondary | ICD-10-CM | POA: Diagnosis not present

## 2020-05-15 DIAGNOSIS — I771 Stricture of artery: Secondary | ICD-10-CM | POA: Diagnosis not present

## 2020-05-15 DIAGNOSIS — E611 Iron deficiency: Secondary | ICD-10-CM | POA: Diagnosis not present

## 2020-05-15 DIAGNOSIS — Z96641 Presence of right artificial hip joint: Secondary | ICD-10-CM | POA: Diagnosis not present

## 2020-05-15 DIAGNOSIS — E559 Vitamin D deficiency, unspecified: Secondary | ICD-10-CM | POA: Diagnosis not present

## 2020-05-15 DIAGNOSIS — Z515 Encounter for palliative care: Secondary | ICD-10-CM

## 2020-05-15 DIAGNOSIS — L89153 Pressure ulcer of sacral region, stage 3: Secondary | ICD-10-CM | POA: Diagnosis not present

## 2020-05-15 DIAGNOSIS — E1151 Type 2 diabetes mellitus with diabetic peripheral angiopathy without gangrene: Secondary | ICD-10-CM | POA: Diagnosis not present

## 2020-05-15 DIAGNOSIS — G20A1 Parkinson's disease without dyskinesia, without mention of fluctuations: Secondary | ICD-10-CM

## 2020-05-15 NOTE — Progress Notes (Signed)
[facility] Consult Note Telephone: [phone]  Fax: [phone]  PATIENT NAME: Rodney Love DOB: 04-03-1945 MRN: [id number]  PRIMARY CARE PROVIDER:   Lujean Amel, [place], [place], [address] [place],   [address]  REFERRING PROVIDER:Lonnie Kendall NP/Dr WalterPharr  RESPONSIBLE PARTY:Self: [phone] h best number to call [place] [phone] Extended Emergency Contact Information Primary Emergency Contact: Dallas Breeding For emails  Home Phone: [phone] Mobile Phone: [phone] Relation: Daughter Secondary Emergency Contact: Ellender Hose Address: [address] [place], [place] of [place] Phone: [phone] Mobile Phone: [phone] Relation: Spouse- HCPOA [name] - daughter [phone] is the oldest daughter lives in [place]    Visit is to build trust and highlight Palliative Medicine as specialized medical care for people living with serious illness, aimed at facilitating better quality of life through symptoms relief, assisting with advance care plan and establishing goals of care.   CHIEF COMPLAINT: Follow up palliative visit/right hip pain   RECOMMENDATIONS/PLAN:   1. Advance Care Planning/Code Status: CODE STATUS reviewed today.  Patient remains a full code  2. Goals of Care: Goals of care include to maximize quality of life and symptom management. Patient hopes to be able to get up and move around and be as independent as possible.   Family is stressed due to patient's recent hospitalization for right hip fracture and subsequent total hip replacement.  Visit consisted of counseling and education dealing with the complex and emotionally intense issues of symptom management and palliative care in the setting of serious and potentially life-threatening illness. Palliative care team will continue to support patient,  patient's family, and medical team.  I spent 20 minutes providing this consultation. More than 50% of the time in this consultation was spent on coordinating communication.  -------------------------------------------------------------------------------------------------------------------------------------------------- 3. Symptom management/Plan:  Pain in Right hip related to recent right hip fracture and subsequent total hip replacement.  Patient is currently taking Tylenol 325 mg 3 times daily as needed for pain.  He endorsed occasional pain but said no pain during visit.  Encouraged to continue with current Tylenol 325 mg 3 times daily as needed for pain.  Premedicate for pain before activities-PT/OT.  If not helpful to discontinue and initiate Tylenol 500 mg with ibuprofen 200 mg every 6 hours as needed for pain. Continue physical therapy; occupational therapy is expected to commence soon.  Follow-up Ortho appointment for next week as planned. Palliative will continue to monitor for symptom management/decline and make recommendations as needed. Return 2 months or prn. Encouraged to call provider sooner with any concerns.   HISTORY OF PRESENT ILLNESS:  Rodney Love is a 75 y.o. male with multiple medical problems including right hip pain related to right hip fracture and subsequent total hip replacement.  Pain is acute, intermittent, started about 3 weeks ago, aching 4 out of 10 on pain scale, relieved with Tylenol, worse in the morning before therapy.  Patient states Tylenol is helpful, denied pain during visit.  History of Parkinson's disease, HTN, CAD - patient has a loop recorder, Syncope.Review and summarization of Epic records shows history from other than patient. Rest of 10 point ROS asked and negative. Palliative Care was asked to follow this patient by consultation request of [name], MD/Lonnie Delilah Shan NP to help address complex decision making in the context of goals of care.   CODE  STATUS: Full  PPS: 40%  HOSPICE  ELIGIBILITY/DIAGNOSIS: TBD  PAST MEDICAL HISTORY:  Past Medical History:  Diagnosis Date  . Arthritis   . Constipation    OCCASIONAL  . Coronary artery disease   . Diabetes mellitus without complication ([facility])   . Environmental allergies   . Hyperlipidemia   . Hypertension   . Inguinal hernia   . Iron deficiency   . Nocturia   . Parkinson disease ([facility])   . Peripheral arterial disease ([facility])     SOCIAL HX: Patient lives at home with his spouse.  He has grown adult children who are involved in his care stop strong family support system identified.  Patient was a craftsman-guard rail istaller/Construction .  Home health service Mon through [facility] from [facility].   FAMILY HX:  Family History  Problem Relation Age of Onset  . Diabetes Mother   . Kidney disease Mother   . Stroke Father   . Stroke Brother   . Cancer Sister   . Cancer Sister   . Diabetes Sister   . Healthy Daughter   . Healthy Daughter   . Healthy Son     Review lab tests/diagnostics No results for input(s): WBC, HGB, HCT, PLT, MCV in the last 168 hours. No results for input(s): NA, K, CL, CO2, BUN, CREATININE, GLUCOSE in the last 168 hours. Latest GFR by Cockcroft Gault (not valid in AKI or ESRD) CrCl cannot be calculated (Unknown ideal weight.). No results for input(s): AST, ALT, ALKPHOS, GGT in the last 168 hours.  Invalid input(s): TBILI, CONJBILI, ALB, TOTALPROTEIN No components found for: ALB No results for input(s): APTT, INR in the last 168 hours.  Invalid input(s): PTPATIENT No results for input(s): BNP, PROBNP in the last 168 hours.  ALLERGIES: No Known Allergies    PERTINENT MEDICATIONS:  Outpatient Encounter Medications as of 05/15/2020  Medication Sig  . acetaminophen (TYLENOL) 500 MG tablet Take 500 mg by mouth every 6 (six) hours as needed for moderate pain.  Marland Kitchen aspirin EC 81 MG EC tablet Take 1 tablet (81 mg total) by mouth 2 (two) times daily with a  meal. Swallow whole.  Marland Kitchen atorvastatin (LIPITOR) 20 MG tablet Take 20 mg by mouth daily.   . carbidopa-levodopa (SINEMET CR) 50-200 MG tablet TAKE 1 TABLET BY MOUTH AT BEDTIME  . Carbidopa-Levodopa ER (SINEMET CR) 25-100 MG tablet controlled release TAKE 1 TABLET BY MOUTH EVERY MORNING, AT NOON, IN THE EVENING. AND AT BEDTIME (Patient taking differently: Take 1 tablet by mouth in the morning, at noon, in the evening, and at bedtime.)  . cholecalciferol (VITAMIN D3) 25 MCG (1000 UNIT) tablet Take 1,000 Units by mouth daily.  . Cyanocobalamin (VITAMIN B 12 PO) Take 1 tablet by mouth daily.  . ferrous sulfate 324 MG TBEC Take 324 mg by mouth daily at 6 (six) AM.  . metFORMIN (GLUCOPHAGE) 850 MG tablet Take 850 mg by mouth 3 (three) times a week. Mon/Wed/Fri  . ONETOUCH ULTRA test strip   . QUEtiapine (SEROQUEL) 25 MG tablet 1/2 in the AM and 1 at bed.  May add another 1/2 tablet in the late afternoon if need (Patient taking differently: Take 12.5 mg by mouth at bedtime.)  . senna (SENOKOT) 8.6 MG TABS tablet Take 1 tablet (8.6 mg total) by mouth 2 (two) times daily.  . [DISCONTINUED] pramipexole (MIRAPEX) 0.125 MG tablet Take 0.125 mg by mouth at bedtime.   Facility-Administered Encounter Medications as of 05/15/2020  Medication  . lidocaine-EPINEPHrine (XYLOCAINE W/EPI) 1 %-1:100000 (with pres)  injection 10 mL    ROS  General: NAD EYES: denies vision changes ENMT: denies dysphagia no xerostomia Cardiovascular: denies chest pain Pulmonary: denies  cough, denies SOB  Abdomen: endorses fair appetite, no constipation or diarrhea GU: denies dysuria or urinary frquency MSK:  endorses ROM limitations, occasional pain Skin: denies rashes or wounds Neurological: endorses weakness, denies pain, denies insomnia Psych: Endorses positive mood Heme/lymph/immuno: denies bruises, abnormal bleeding   PHYSICAL EXAM  Height 6 feet 2 inches Weight 165Ibs General: In no acute distress,  Cardiovascular:  regular rate and rhythm Pulmonary: no cough, no increased work of breathing, normal respiratory effort Abdomen: soft, non tender, positive bowel sounds in all quadrants GU:  no suprapubic tenderness Eyes: Normal lids, no discharge, sclera anicteric ENMT: Moist mucous membranes Musculoskeletal:  no edema in BLE; dressing to bilateral hip clean dry and intact, no warmth no redness no signs of infection. Skin: no rash to visible skin, warm without cyanosis  Psych: non-anxious affect Neurological: Weakness but otherwise non focal Heme/lymph/immuno: no bruises, no bleeding  Thank you for the opportunity to participate in the care of ALLIE GERHOLD Please call our office at [phone] if we can be of additional assistance.  Note: Portions of this note were generated with Lobbyist. Dictation errors may occur despite best attempts at proofreading.  Teodoro Spray, NP

## 2020-05-15 NOTE — Telephone Encounter (Signed)
Rx(s) sent to pharmacy electronically.  

## 2020-05-16 DIAGNOSIS — Z7984 Long term (current) use of oral hypoglycemic drugs: Secondary | ICD-10-CM | POA: Diagnosis not present

## 2020-05-16 DIAGNOSIS — I771 Stricture of artery: Secondary | ICD-10-CM | POA: Diagnosis not present

## 2020-05-16 DIAGNOSIS — E611 Iron deficiency: Secondary | ICD-10-CM | POA: Diagnosis not present

## 2020-05-16 DIAGNOSIS — Z9181 History of falling: Secondary | ICD-10-CM | POA: Diagnosis not present

## 2020-05-16 DIAGNOSIS — E785 Hyperlipidemia, unspecified: Secondary | ICD-10-CM | POA: Diagnosis not present

## 2020-05-16 DIAGNOSIS — E559 Vitamin D deficiency, unspecified: Secondary | ICD-10-CM | POA: Diagnosis not present

## 2020-05-16 DIAGNOSIS — L89153 Pressure ulcer of sacral region, stage 3: Secondary | ICD-10-CM | POA: Diagnosis not present

## 2020-05-16 DIAGNOSIS — Z87891 Personal history of nicotine dependence: Secondary | ICD-10-CM | POA: Diagnosis not present

## 2020-05-16 DIAGNOSIS — G20 Parkinson's disease: Secondary | ICD-10-CM | POA: Diagnosis not present

## 2020-05-16 DIAGNOSIS — Z471 Aftercare following joint replacement surgery: Secondary | ICD-10-CM | POA: Diagnosis not present

## 2020-05-16 DIAGNOSIS — E1151 Type 2 diabetes mellitus with diabetic peripheral angiopathy without gangrene: Secondary | ICD-10-CM | POA: Diagnosis not present

## 2020-05-16 DIAGNOSIS — Z96641 Presence of right artificial hip joint: Secondary | ICD-10-CM | POA: Diagnosis not present

## 2020-05-16 DIAGNOSIS — G2581 Restless legs syndrome: Secondary | ICD-10-CM | POA: Diagnosis not present

## 2020-05-16 DIAGNOSIS — I6521 Occlusion and stenosis of right carotid artery: Secondary | ICD-10-CM | POA: Diagnosis not present

## 2020-05-17 DIAGNOSIS — E1151 Type 2 diabetes mellitus with diabetic peripheral angiopathy without gangrene: Secondary | ICD-10-CM | POA: Diagnosis not present

## 2020-05-17 DIAGNOSIS — Z79899 Other long term (current) drug therapy: Secondary | ICD-10-CM | POA: Diagnosis not present

## 2020-05-17 DIAGNOSIS — G2581 Restless legs syndrome: Secondary | ICD-10-CM | POA: Diagnosis not present

## 2020-05-17 DIAGNOSIS — I771 Stricture of artery: Secondary | ICD-10-CM | POA: Diagnosis not present

## 2020-05-17 DIAGNOSIS — E611 Iron deficiency: Secondary | ICD-10-CM | POA: Diagnosis not present

## 2020-05-17 DIAGNOSIS — Z87891 Personal history of nicotine dependence: Secondary | ICD-10-CM | POA: Diagnosis not present

## 2020-05-17 DIAGNOSIS — Z96641 Presence of right artificial hip joint: Secondary | ICD-10-CM | POA: Diagnosis not present

## 2020-05-17 DIAGNOSIS — Z7984 Long term (current) use of oral hypoglycemic drugs: Secondary | ICD-10-CM | POA: Diagnosis not present

## 2020-05-17 DIAGNOSIS — Z9181 History of falling: Secondary | ICD-10-CM | POA: Diagnosis not present

## 2020-05-17 DIAGNOSIS — E559 Vitamin D deficiency, unspecified: Secondary | ICD-10-CM | POA: Diagnosis not present

## 2020-05-17 DIAGNOSIS — L89153 Pressure ulcer of sacral region, stage 3: Secondary | ICD-10-CM | POA: Diagnosis not present

## 2020-05-17 DIAGNOSIS — E785 Hyperlipidemia, unspecified: Secondary | ICD-10-CM | POA: Diagnosis not present

## 2020-05-17 DIAGNOSIS — I6521 Occlusion and stenosis of right carotid artery: Secondary | ICD-10-CM | POA: Diagnosis not present

## 2020-05-17 DIAGNOSIS — Z471 Aftercare following joint replacement surgery: Secondary | ICD-10-CM | POA: Diagnosis not present

## 2020-05-17 DIAGNOSIS — G20 Parkinson's disease: Secondary | ICD-10-CM | POA: Diagnosis not present

## 2020-05-18 DIAGNOSIS — Z7984 Long term (current) use of oral hypoglycemic drugs: Secondary | ICD-10-CM | POA: Diagnosis not present

## 2020-05-18 DIAGNOSIS — Z87891 Personal history of nicotine dependence: Secondary | ICD-10-CM | POA: Diagnosis not present

## 2020-05-18 DIAGNOSIS — Z79899 Other long term (current) drug therapy: Secondary | ICD-10-CM | POA: Diagnosis not present

## 2020-05-18 DIAGNOSIS — Z9181 History of falling: Secondary | ICD-10-CM | POA: Diagnosis not present

## 2020-05-18 DIAGNOSIS — Z96641 Presence of right artificial hip joint: Secondary | ICD-10-CM | POA: Diagnosis not present

## 2020-05-18 DIAGNOSIS — E785 Hyperlipidemia, unspecified: Secondary | ICD-10-CM | POA: Diagnosis not present

## 2020-05-18 DIAGNOSIS — S72301S Unspecified fracture of shaft of right femur, sequela: Secondary | ICD-10-CM | POA: Diagnosis not present

## 2020-05-18 DIAGNOSIS — E1169 Type 2 diabetes mellitus with other specified complication: Secondary | ICD-10-CM | POA: Diagnosis not present

## 2020-05-18 DIAGNOSIS — E559 Vitamin D deficiency, unspecified: Secondary | ICD-10-CM | POA: Diagnosis not present

## 2020-05-18 DIAGNOSIS — E611 Iron deficiency: Secondary | ICD-10-CM | POA: Diagnosis not present

## 2020-05-18 DIAGNOSIS — E7800 Pure hypercholesterolemia, unspecified: Secondary | ICD-10-CM | POA: Diagnosis not present

## 2020-05-18 DIAGNOSIS — I10 Essential (primary) hypertension: Secondary | ICD-10-CM | POA: Diagnosis not present

## 2020-05-18 DIAGNOSIS — E1151 Type 2 diabetes mellitus with diabetic peripheral angiopathy without gangrene: Secondary | ICD-10-CM | POA: Diagnosis not present

## 2020-05-18 DIAGNOSIS — Z471 Aftercare following joint replacement surgery: Secondary | ICD-10-CM | POA: Diagnosis not present

## 2020-05-18 DIAGNOSIS — G20 Parkinson's disease: Secondary | ICD-10-CM | POA: Diagnosis not present

## 2020-05-18 DIAGNOSIS — G2581 Restless legs syndrome: Secondary | ICD-10-CM | POA: Diagnosis not present

## 2020-05-18 DIAGNOSIS — L89153 Pressure ulcer of sacral region, stage 3: Secondary | ICD-10-CM | POA: Diagnosis not present

## 2020-05-18 DIAGNOSIS — I771 Stricture of artery: Secondary | ICD-10-CM | POA: Diagnosis not present

## 2020-05-18 DIAGNOSIS — I6521 Occlusion and stenosis of right carotid artery: Secondary | ICD-10-CM | POA: Diagnosis not present

## 2020-05-19 DIAGNOSIS — Z471 Aftercare following joint replacement surgery: Secondary | ICD-10-CM | POA: Diagnosis not present

## 2020-05-19 DIAGNOSIS — Z87891 Personal history of nicotine dependence: Secondary | ICD-10-CM | POA: Diagnosis not present

## 2020-05-19 DIAGNOSIS — E1151 Type 2 diabetes mellitus with diabetic peripheral angiopathy without gangrene: Secondary | ICD-10-CM | POA: Diagnosis not present

## 2020-05-19 DIAGNOSIS — Z79899 Other long term (current) drug therapy: Secondary | ICD-10-CM | POA: Diagnosis not present

## 2020-05-19 DIAGNOSIS — I6521 Occlusion and stenosis of right carotid artery: Secondary | ICD-10-CM | POA: Diagnosis not present

## 2020-05-19 DIAGNOSIS — Z96641 Presence of right artificial hip joint: Secondary | ICD-10-CM | POA: Diagnosis not present

## 2020-05-19 DIAGNOSIS — Z9181 History of falling: Secondary | ICD-10-CM | POA: Diagnosis not present

## 2020-05-19 DIAGNOSIS — E611 Iron deficiency: Secondary | ICD-10-CM | POA: Diagnosis not present

## 2020-05-19 DIAGNOSIS — G20 Parkinson's disease: Secondary | ICD-10-CM | POA: Diagnosis not present

## 2020-05-19 DIAGNOSIS — E785 Hyperlipidemia, unspecified: Secondary | ICD-10-CM | POA: Diagnosis not present

## 2020-05-19 DIAGNOSIS — E559 Vitamin D deficiency, unspecified: Secondary | ICD-10-CM | POA: Diagnosis not present

## 2020-05-19 DIAGNOSIS — I771 Stricture of artery: Secondary | ICD-10-CM | POA: Diagnosis not present

## 2020-05-19 DIAGNOSIS — G2581 Restless legs syndrome: Secondary | ICD-10-CM | POA: Diagnosis not present

## 2020-05-19 DIAGNOSIS — Z7984 Long term (current) use of oral hypoglycemic drugs: Secondary | ICD-10-CM | POA: Diagnosis not present

## 2020-05-19 DIAGNOSIS — L89153 Pressure ulcer of sacral region, stage 3: Secondary | ICD-10-CM | POA: Diagnosis not present

## 2020-05-22 DIAGNOSIS — Z471 Aftercare following joint replacement surgery: Secondary | ICD-10-CM | POA: Diagnosis not present

## 2020-05-22 DIAGNOSIS — I771 Stricture of artery: Secondary | ICD-10-CM | POA: Diagnosis not present

## 2020-05-22 DIAGNOSIS — I6521 Occlusion and stenosis of right carotid artery: Secondary | ICD-10-CM | POA: Diagnosis not present

## 2020-05-22 DIAGNOSIS — R6889 Other general symptoms and signs: Secondary | ICD-10-CM | POA: Diagnosis not present

## 2020-05-22 DIAGNOSIS — G20 Parkinson's disease: Secondary | ICD-10-CM | POA: Diagnosis not present

## 2020-05-22 DIAGNOSIS — E785 Hyperlipidemia, unspecified: Secondary | ICD-10-CM | POA: Diagnosis not present

## 2020-05-22 DIAGNOSIS — G2581 Restless legs syndrome: Secondary | ICD-10-CM | POA: Diagnosis not present

## 2020-05-22 DIAGNOSIS — Z9181 History of falling: Secondary | ICD-10-CM | POA: Diagnosis not present

## 2020-05-22 DIAGNOSIS — Z7984 Long term (current) use of oral hypoglycemic drugs: Secondary | ICD-10-CM | POA: Diagnosis not present

## 2020-05-22 DIAGNOSIS — E611 Iron deficiency: Secondary | ICD-10-CM | POA: Diagnosis not present

## 2020-05-22 DIAGNOSIS — E559 Vitamin D deficiency, unspecified: Secondary | ICD-10-CM | POA: Diagnosis not present

## 2020-05-22 DIAGNOSIS — Z79899 Other long term (current) drug therapy: Secondary | ICD-10-CM | POA: Diagnosis not present

## 2020-05-22 DIAGNOSIS — E1151 Type 2 diabetes mellitus with diabetic peripheral angiopathy without gangrene: Secondary | ICD-10-CM | POA: Diagnosis not present

## 2020-05-22 DIAGNOSIS — S72001D Fracture of unspecified part of neck of right femur, subsequent encounter for closed fracture with routine healing: Secondary | ICD-10-CM | POA: Diagnosis not present

## 2020-05-22 DIAGNOSIS — Z87891 Personal history of nicotine dependence: Secondary | ICD-10-CM | POA: Diagnosis not present

## 2020-05-22 DIAGNOSIS — L89153 Pressure ulcer of sacral region, stage 3: Secondary | ICD-10-CM | POA: Diagnosis not present

## 2020-05-22 DIAGNOSIS — Z96641 Presence of right artificial hip joint: Secondary | ICD-10-CM | POA: Diagnosis not present

## 2020-05-24 DIAGNOSIS — G2581 Restless legs syndrome: Secondary | ICD-10-CM | POA: Diagnosis not present

## 2020-05-24 DIAGNOSIS — Z96641 Presence of right artificial hip joint: Secondary | ICD-10-CM | POA: Diagnosis not present

## 2020-05-24 DIAGNOSIS — Z7984 Long term (current) use of oral hypoglycemic drugs: Secondary | ICD-10-CM | POA: Diagnosis not present

## 2020-05-24 DIAGNOSIS — E785 Hyperlipidemia, unspecified: Secondary | ICD-10-CM | POA: Diagnosis not present

## 2020-05-24 DIAGNOSIS — I771 Stricture of artery: Secondary | ICD-10-CM | POA: Diagnosis not present

## 2020-05-24 DIAGNOSIS — Z9181 History of falling: Secondary | ICD-10-CM | POA: Diagnosis not present

## 2020-05-24 DIAGNOSIS — I6521 Occlusion and stenosis of right carotid artery: Secondary | ICD-10-CM | POA: Diagnosis not present

## 2020-05-24 DIAGNOSIS — Z79899 Other long term (current) drug therapy: Secondary | ICD-10-CM | POA: Diagnosis not present

## 2020-05-24 DIAGNOSIS — Z87891 Personal history of nicotine dependence: Secondary | ICD-10-CM | POA: Diagnosis not present

## 2020-05-24 DIAGNOSIS — E611 Iron deficiency: Secondary | ICD-10-CM | POA: Diagnosis not present

## 2020-05-24 DIAGNOSIS — E1151 Type 2 diabetes mellitus with diabetic peripheral angiopathy without gangrene: Secondary | ICD-10-CM | POA: Diagnosis not present

## 2020-05-24 DIAGNOSIS — E559 Vitamin D deficiency, unspecified: Secondary | ICD-10-CM | POA: Diagnosis not present

## 2020-05-24 DIAGNOSIS — L89153 Pressure ulcer of sacral region, stage 3: Secondary | ICD-10-CM | POA: Diagnosis not present

## 2020-05-24 DIAGNOSIS — G20 Parkinson's disease: Secondary | ICD-10-CM | POA: Diagnosis not present

## 2020-05-24 DIAGNOSIS — Z471 Aftercare following joint replacement surgery: Secondary | ICD-10-CM | POA: Diagnosis not present

## 2020-05-26 ENCOUNTER — Other Ambulatory Visit: Payer: Self-pay | Admitting: Neurology

## 2020-05-26 DIAGNOSIS — Z96641 Presence of right artificial hip joint: Secondary | ICD-10-CM | POA: Diagnosis not present

## 2020-05-26 DIAGNOSIS — E559 Vitamin D deficiency, unspecified: Secondary | ICD-10-CM | POA: Diagnosis not present

## 2020-05-26 DIAGNOSIS — E785 Hyperlipidemia, unspecified: Secondary | ICD-10-CM | POA: Diagnosis not present

## 2020-05-26 DIAGNOSIS — I6521 Occlusion and stenosis of right carotid artery: Secondary | ICD-10-CM | POA: Diagnosis not present

## 2020-05-26 DIAGNOSIS — G2581 Restless legs syndrome: Secondary | ICD-10-CM | POA: Diagnosis not present

## 2020-05-26 DIAGNOSIS — Z9181 History of falling: Secondary | ICD-10-CM | POA: Diagnosis not present

## 2020-05-26 DIAGNOSIS — Z471 Aftercare following joint replacement surgery: Secondary | ICD-10-CM | POA: Diagnosis not present

## 2020-05-26 DIAGNOSIS — Z79899 Other long term (current) drug therapy: Secondary | ICD-10-CM | POA: Diagnosis not present

## 2020-05-26 DIAGNOSIS — G20 Parkinson's disease: Secondary | ICD-10-CM | POA: Diagnosis not present

## 2020-05-26 DIAGNOSIS — Z87891 Personal history of nicotine dependence: Secondary | ICD-10-CM | POA: Diagnosis not present

## 2020-05-26 DIAGNOSIS — I771 Stricture of artery: Secondary | ICD-10-CM | POA: Diagnosis not present

## 2020-05-26 DIAGNOSIS — L89153 Pressure ulcer of sacral region, stage 3: Secondary | ICD-10-CM | POA: Diagnosis not present

## 2020-05-26 DIAGNOSIS — Z7984 Long term (current) use of oral hypoglycemic drugs: Secondary | ICD-10-CM | POA: Diagnosis not present

## 2020-05-26 DIAGNOSIS — E1151 Type 2 diabetes mellitus with diabetic peripheral angiopathy without gangrene: Secondary | ICD-10-CM | POA: Diagnosis not present

## 2020-05-26 DIAGNOSIS — E611 Iron deficiency: Secondary | ICD-10-CM | POA: Diagnosis not present

## 2020-05-26 NOTE — Telephone Encounter (Signed)
Rx(s) sent to pharmacy electronically.  

## 2020-05-29 DIAGNOSIS — Z79899 Other long term (current) drug therapy: Secondary | ICD-10-CM | POA: Diagnosis not present

## 2020-05-29 DIAGNOSIS — I6521 Occlusion and stenosis of right carotid artery: Secondary | ICD-10-CM | POA: Diagnosis not present

## 2020-05-29 DIAGNOSIS — Z87891 Personal history of nicotine dependence: Secondary | ICD-10-CM | POA: Diagnosis not present

## 2020-05-29 DIAGNOSIS — G20 Parkinson's disease: Secondary | ICD-10-CM | POA: Diagnosis not present

## 2020-05-29 DIAGNOSIS — Z96641 Presence of right artificial hip joint: Secondary | ICD-10-CM | POA: Diagnosis not present

## 2020-05-29 DIAGNOSIS — E1151 Type 2 diabetes mellitus with diabetic peripheral angiopathy without gangrene: Secondary | ICD-10-CM | POA: Diagnosis not present

## 2020-05-29 DIAGNOSIS — L89153 Pressure ulcer of sacral region, stage 3: Secondary | ICD-10-CM | POA: Diagnosis not present

## 2020-05-29 DIAGNOSIS — E611 Iron deficiency: Secondary | ICD-10-CM | POA: Diagnosis not present

## 2020-05-29 DIAGNOSIS — I771 Stricture of artery: Secondary | ICD-10-CM | POA: Diagnosis not present

## 2020-05-29 DIAGNOSIS — E785 Hyperlipidemia, unspecified: Secondary | ICD-10-CM | POA: Diagnosis not present

## 2020-05-29 DIAGNOSIS — Z471 Aftercare following joint replacement surgery: Secondary | ICD-10-CM | POA: Diagnosis not present

## 2020-05-29 DIAGNOSIS — Z7984 Long term (current) use of oral hypoglycemic drugs: Secondary | ICD-10-CM | POA: Diagnosis not present

## 2020-05-29 DIAGNOSIS — G2581 Restless legs syndrome: Secondary | ICD-10-CM | POA: Diagnosis not present

## 2020-05-29 DIAGNOSIS — Z9181 History of falling: Secondary | ICD-10-CM | POA: Diagnosis not present

## 2020-05-29 DIAGNOSIS — E559 Vitamin D deficiency, unspecified: Secondary | ICD-10-CM | POA: Diagnosis not present

## 2020-06-01 DIAGNOSIS — Z7984 Long term (current) use of oral hypoglycemic drugs: Secondary | ICD-10-CM | POA: Diagnosis not present

## 2020-06-01 DIAGNOSIS — G20 Parkinson's disease: Secondary | ICD-10-CM | POA: Diagnosis not present

## 2020-06-01 DIAGNOSIS — Z471 Aftercare following joint replacement surgery: Secondary | ICD-10-CM | POA: Diagnosis not present

## 2020-06-01 DIAGNOSIS — Z79899 Other long term (current) drug therapy: Secondary | ICD-10-CM | POA: Diagnosis not present

## 2020-06-01 DIAGNOSIS — Z9181 History of falling: Secondary | ICD-10-CM | POA: Diagnosis not present

## 2020-06-01 DIAGNOSIS — Z87891 Personal history of nicotine dependence: Secondary | ICD-10-CM | POA: Diagnosis not present

## 2020-06-01 DIAGNOSIS — I6521 Occlusion and stenosis of right carotid artery: Secondary | ICD-10-CM | POA: Diagnosis not present

## 2020-06-01 DIAGNOSIS — E559 Vitamin D deficiency, unspecified: Secondary | ICD-10-CM | POA: Diagnosis not present

## 2020-06-01 DIAGNOSIS — L89153 Pressure ulcer of sacral region, stage 3: Secondary | ICD-10-CM | POA: Diagnosis not present

## 2020-06-01 DIAGNOSIS — E785 Hyperlipidemia, unspecified: Secondary | ICD-10-CM | POA: Diagnosis not present

## 2020-06-01 DIAGNOSIS — I771 Stricture of artery: Secondary | ICD-10-CM | POA: Diagnosis not present

## 2020-06-01 DIAGNOSIS — Z96641 Presence of right artificial hip joint: Secondary | ICD-10-CM | POA: Diagnosis not present

## 2020-06-01 DIAGNOSIS — G2581 Restless legs syndrome: Secondary | ICD-10-CM | POA: Diagnosis not present

## 2020-06-01 DIAGNOSIS — E1151 Type 2 diabetes mellitus with diabetic peripheral angiopathy without gangrene: Secondary | ICD-10-CM | POA: Diagnosis not present

## 2020-06-01 DIAGNOSIS — E611 Iron deficiency: Secondary | ICD-10-CM | POA: Diagnosis not present

## 2020-06-02 DIAGNOSIS — S7291XA Unspecified fracture of right femur, initial encounter for closed fracture: Secondary | ICD-10-CM | POA: Diagnosis not present

## 2020-06-02 DIAGNOSIS — I739 Peripheral vascular disease, unspecified: Secondary | ICD-10-CM | POA: Diagnosis not present

## 2020-06-02 DIAGNOSIS — G20 Parkinson's disease: Secondary | ICD-10-CM | POA: Diagnosis not present

## 2020-06-05 DIAGNOSIS — E611 Iron deficiency: Secondary | ICD-10-CM | POA: Diagnosis not present

## 2020-06-05 DIAGNOSIS — G20 Parkinson's disease: Secondary | ICD-10-CM | POA: Diagnosis not present

## 2020-06-05 DIAGNOSIS — I771 Stricture of artery: Secondary | ICD-10-CM | POA: Diagnosis not present

## 2020-06-05 DIAGNOSIS — Z471 Aftercare following joint replacement surgery: Secondary | ICD-10-CM | POA: Diagnosis not present

## 2020-06-05 DIAGNOSIS — Z79899 Other long term (current) drug therapy: Secondary | ICD-10-CM | POA: Diagnosis not present

## 2020-06-05 DIAGNOSIS — E785 Hyperlipidemia, unspecified: Secondary | ICD-10-CM | POA: Diagnosis not present

## 2020-06-05 DIAGNOSIS — E559 Vitamin D deficiency, unspecified: Secondary | ICD-10-CM | POA: Diagnosis not present

## 2020-06-05 DIAGNOSIS — Z7984 Long term (current) use of oral hypoglycemic drugs: Secondary | ICD-10-CM | POA: Diagnosis not present

## 2020-06-05 DIAGNOSIS — Z87891 Personal history of nicotine dependence: Secondary | ICD-10-CM | POA: Diagnosis not present

## 2020-06-05 DIAGNOSIS — Z9181 History of falling: Secondary | ICD-10-CM | POA: Diagnosis not present

## 2020-06-05 DIAGNOSIS — I6521 Occlusion and stenosis of right carotid artery: Secondary | ICD-10-CM | POA: Diagnosis not present

## 2020-06-05 DIAGNOSIS — E1151 Type 2 diabetes mellitus with diabetic peripheral angiopathy without gangrene: Secondary | ICD-10-CM | POA: Diagnosis not present

## 2020-06-05 DIAGNOSIS — G2581 Restless legs syndrome: Secondary | ICD-10-CM | POA: Diagnosis not present

## 2020-06-05 DIAGNOSIS — Z96641 Presence of right artificial hip joint: Secondary | ICD-10-CM | POA: Diagnosis not present

## 2020-06-05 DIAGNOSIS — L89153 Pressure ulcer of sacral region, stage 3: Secondary | ICD-10-CM | POA: Diagnosis not present

## 2020-06-07 DIAGNOSIS — Z7984 Long term (current) use of oral hypoglycemic drugs: Secondary | ICD-10-CM | POA: Diagnosis not present

## 2020-06-07 DIAGNOSIS — E559 Vitamin D deficiency, unspecified: Secondary | ICD-10-CM | POA: Diagnosis not present

## 2020-06-07 DIAGNOSIS — E785 Hyperlipidemia, unspecified: Secondary | ICD-10-CM | POA: Diagnosis not present

## 2020-06-07 DIAGNOSIS — Z471 Aftercare following joint replacement surgery: Secondary | ICD-10-CM | POA: Diagnosis not present

## 2020-06-07 DIAGNOSIS — G2581 Restless legs syndrome: Secondary | ICD-10-CM | POA: Diagnosis not present

## 2020-06-07 DIAGNOSIS — L89153 Pressure ulcer of sacral region, stage 3: Secondary | ICD-10-CM | POA: Diagnosis not present

## 2020-06-07 DIAGNOSIS — Z87891 Personal history of nicotine dependence: Secondary | ICD-10-CM | POA: Diagnosis not present

## 2020-06-07 DIAGNOSIS — G20 Parkinson's disease: Secondary | ICD-10-CM | POA: Diagnosis not present

## 2020-06-07 DIAGNOSIS — Z9181 History of falling: Secondary | ICD-10-CM | POA: Diagnosis not present

## 2020-06-07 DIAGNOSIS — Z79899 Other long term (current) drug therapy: Secondary | ICD-10-CM | POA: Diagnosis not present

## 2020-06-07 DIAGNOSIS — E1151 Type 2 diabetes mellitus with diabetic peripheral angiopathy without gangrene: Secondary | ICD-10-CM | POA: Diagnosis not present

## 2020-06-07 DIAGNOSIS — I6521 Occlusion and stenosis of right carotid artery: Secondary | ICD-10-CM | POA: Diagnosis not present

## 2020-06-07 DIAGNOSIS — I771 Stricture of artery: Secondary | ICD-10-CM | POA: Diagnosis not present

## 2020-06-07 DIAGNOSIS — Z96641 Presence of right artificial hip joint: Secondary | ICD-10-CM | POA: Diagnosis not present

## 2020-06-07 DIAGNOSIS — E611 Iron deficiency: Secondary | ICD-10-CM | POA: Diagnosis not present

## 2020-06-09 DIAGNOSIS — E611 Iron deficiency: Secondary | ICD-10-CM | POA: Diagnosis not present

## 2020-06-09 DIAGNOSIS — E559 Vitamin D deficiency, unspecified: Secondary | ICD-10-CM | POA: Diagnosis not present

## 2020-06-09 DIAGNOSIS — I6521 Occlusion and stenosis of right carotid artery: Secondary | ICD-10-CM | POA: Diagnosis not present

## 2020-06-09 DIAGNOSIS — G20 Parkinson's disease: Secondary | ICD-10-CM | POA: Diagnosis not present

## 2020-06-09 DIAGNOSIS — Z87891 Personal history of nicotine dependence: Secondary | ICD-10-CM | POA: Diagnosis not present

## 2020-06-09 DIAGNOSIS — Z96641 Presence of right artificial hip joint: Secondary | ICD-10-CM | POA: Diagnosis not present

## 2020-06-09 DIAGNOSIS — G2581 Restless legs syndrome: Secondary | ICD-10-CM | POA: Diagnosis not present

## 2020-06-09 DIAGNOSIS — Z471 Aftercare following joint replacement surgery: Secondary | ICD-10-CM | POA: Diagnosis not present

## 2020-06-09 DIAGNOSIS — L89153 Pressure ulcer of sacral region, stage 3: Secondary | ICD-10-CM | POA: Diagnosis not present

## 2020-06-09 DIAGNOSIS — E785 Hyperlipidemia, unspecified: Secondary | ICD-10-CM | POA: Diagnosis not present

## 2020-06-09 DIAGNOSIS — Z9181 History of falling: Secondary | ICD-10-CM | POA: Diagnosis not present

## 2020-06-09 DIAGNOSIS — Z79899 Other long term (current) drug therapy: Secondary | ICD-10-CM | POA: Diagnosis not present

## 2020-06-09 DIAGNOSIS — Z7984 Long term (current) use of oral hypoglycemic drugs: Secondary | ICD-10-CM | POA: Diagnosis not present

## 2020-06-09 DIAGNOSIS — E1151 Type 2 diabetes mellitus with diabetic peripheral angiopathy without gangrene: Secondary | ICD-10-CM | POA: Diagnosis not present

## 2020-06-09 DIAGNOSIS — I771 Stricture of artery: Secondary | ICD-10-CM | POA: Diagnosis not present

## 2020-06-10 DIAGNOSIS — E611 Iron deficiency: Secondary | ICD-10-CM | POA: Diagnosis not present

## 2020-06-10 DIAGNOSIS — Z79899 Other long term (current) drug therapy: Secondary | ICD-10-CM | POA: Diagnosis not present

## 2020-06-10 DIAGNOSIS — E1151 Type 2 diabetes mellitus with diabetic peripheral angiopathy without gangrene: Secondary | ICD-10-CM | POA: Diagnosis not present

## 2020-06-10 DIAGNOSIS — E559 Vitamin D deficiency, unspecified: Secondary | ICD-10-CM | POA: Diagnosis not present

## 2020-06-10 DIAGNOSIS — Z7984 Long term (current) use of oral hypoglycemic drugs: Secondary | ICD-10-CM | POA: Diagnosis not present

## 2020-06-10 DIAGNOSIS — L89153 Pressure ulcer of sacral region, stage 3: Secondary | ICD-10-CM | POA: Diagnosis not present

## 2020-06-10 DIAGNOSIS — G2581 Restless legs syndrome: Secondary | ICD-10-CM | POA: Diagnosis not present

## 2020-06-10 DIAGNOSIS — Z87891 Personal history of nicotine dependence: Secondary | ICD-10-CM | POA: Diagnosis not present

## 2020-06-10 DIAGNOSIS — I771 Stricture of artery: Secondary | ICD-10-CM | POA: Diagnosis not present

## 2020-06-10 DIAGNOSIS — Z471 Aftercare following joint replacement surgery: Secondary | ICD-10-CM | POA: Diagnosis not present

## 2020-06-10 DIAGNOSIS — G20 Parkinson's disease: Secondary | ICD-10-CM | POA: Diagnosis not present

## 2020-06-10 DIAGNOSIS — Z9181 History of falling: Secondary | ICD-10-CM | POA: Diagnosis not present

## 2020-06-10 DIAGNOSIS — E785 Hyperlipidemia, unspecified: Secondary | ICD-10-CM | POA: Diagnosis not present

## 2020-06-10 DIAGNOSIS — Z96641 Presence of right artificial hip joint: Secondary | ICD-10-CM | POA: Diagnosis not present

## 2020-06-10 DIAGNOSIS — I6521 Occlusion and stenosis of right carotid artery: Secondary | ICD-10-CM | POA: Diagnosis not present

## 2020-06-12 ENCOUNTER — Other Ambulatory Visit: Payer: Self-pay

## 2020-06-12 ENCOUNTER — Other Ambulatory Visit: Payer: Medicare Other | Admitting: Hospice

## 2020-06-12 DIAGNOSIS — M25551 Pain in right hip: Secondary | ICD-10-CM | POA: Diagnosis not present

## 2020-06-12 DIAGNOSIS — G20 Parkinson's disease: Secondary | ICD-10-CM | POA: Diagnosis not present

## 2020-06-12 DIAGNOSIS — G2581 Restless legs syndrome: Secondary | ICD-10-CM | POA: Diagnosis not present

## 2020-06-12 DIAGNOSIS — E559 Vitamin D deficiency, unspecified: Secondary | ICD-10-CM | POA: Diagnosis not present

## 2020-06-12 DIAGNOSIS — Z9181 History of falling: Secondary | ICD-10-CM | POA: Diagnosis not present

## 2020-06-12 DIAGNOSIS — E611 Iron deficiency: Secondary | ICD-10-CM | POA: Diagnosis not present

## 2020-06-12 DIAGNOSIS — Z87891 Personal history of nicotine dependence: Secondary | ICD-10-CM | POA: Diagnosis not present

## 2020-06-12 DIAGNOSIS — Z515 Encounter for palliative care: Secondary | ICD-10-CM

## 2020-06-12 DIAGNOSIS — E1151 Type 2 diabetes mellitus with diabetic peripheral angiopathy without gangrene: Secondary | ICD-10-CM | POA: Diagnosis not present

## 2020-06-12 DIAGNOSIS — Z471 Aftercare following joint replacement surgery: Secondary | ICD-10-CM | POA: Diagnosis not present

## 2020-06-12 DIAGNOSIS — I6521 Occlusion and stenosis of right carotid artery: Secondary | ICD-10-CM | POA: Diagnosis not present

## 2020-06-12 DIAGNOSIS — Z79899 Other long term (current) drug therapy: Secondary | ICD-10-CM | POA: Diagnosis not present

## 2020-06-12 DIAGNOSIS — I771 Stricture of artery: Secondary | ICD-10-CM | POA: Diagnosis not present

## 2020-06-12 DIAGNOSIS — E785 Hyperlipidemia, unspecified: Secondary | ICD-10-CM | POA: Diagnosis not present

## 2020-06-12 DIAGNOSIS — Z7984 Long term (current) use of oral hypoglycemic drugs: Secondary | ICD-10-CM | POA: Diagnosis not present

## 2020-06-12 DIAGNOSIS — Z96641 Presence of right artificial hip joint: Secondary | ICD-10-CM | POA: Diagnosis not present

## 2020-06-12 DIAGNOSIS — L89153 Pressure ulcer of sacral region, stage 3: Secondary | ICD-10-CM | POA: Diagnosis not present

## 2020-06-12 NOTE — Progress Notes (Signed)
[facility] Consult Note Telephone: [phone]  Fax: [phone]  PATIENT NAME: Rodney Love DOB: [birth year] MRN: [id number]  PRIMARY CARE PROVIDER:   Lujean Amel, MD Rodney Amel, MD [place] [address],  [place] [address]  REFERRING PROVIDER: Lujean Amel, MD Rodney Amel, MD Rodney Love [address],  [place] [address]  RESPONSIBLE PARTY:Self: [phone] h best number to call [place] [phone] Extended Emergency Contact Information Primary Emergency Contact: Rodney Love For emails  Home Phone: [phone] Mobile Phone: [phone] Relation: Daughter Secondary Emergency Contact: Rodney Love Address: [place], [place] of [place] Phone: [phone] Mobile Phone: [phone] Relation: Spouse- HCPOA Rodney Love - daughter [phone] is the oldest daughter lives in [place] Due to the COVID-19 crisis, this visit was done via telephone from my office. It was initiated and consented to by this patient and/or family.  Visit is to build trust and highlight Palliative Medicine as specialized medical care for people living with serious illness, aimed at facilitating better quality of life through symptoms relief, assisting with advance care plan and establishing goals of care.   CHIEF COMPLAINT: Palliative follow up visit/Right hip pain  RECOMMENDATIONS/PLAN:   1. Advance Care Planning/Code Status: Discussion today on need to reevaluate CODE STATUS in the context of ongoing debilitating medical conditions.  Family will continue to consider.  Patient remains a full code.  2. Goals of Care: Goals of care include to maximize quality of life and symptom management.  Palliative care team will continue to support patient, patient's family, and medical team.  I spent 16 minutes providing  this consultation. More than 50% of the time in this consultation was spent on coordinating communication.  -------------------------------------------------------------------------------------------------------------------------------------------------- 3. Symptom management/Plan:  Right hip pain: Pain improved; Tylenol effective.  Continue Tylenol 500 mg every 6 hours as needed for pain.  Continue PT OT as scheduled.  Fall precautions. Tremors: Education provided on tremors and Parkinson disease.  Continue Sinemet as ordered.  Neurologist consult as needed. Palliative will continue to monitor for symptom management/decline and make recommendations as needed. Return 2 months or prn. Encouraged to call provider sooner with any concerns.   HISTORY OF PRESENT ILLNESS:  Rodney Love is a 75 y.o. male with multiple medical problems including Parkinson's disease, hypertension, CAD-patient has a loop recorder, Syncope.  Follow-up visit for right hip pain related to recent right hip fracture and subsequent total rib hip replacement.  Pain in right hip started about 2 month ago has improved, comes and goes; well managed with Tylenol.  Intensity of pain has reduced from 10 out of 10 to about 2-4 out of 10 in a pain scale where 0 is no pain and 4 is worst pain ever; pain continues to impair patient's independence and activities of daily living.  Following total hip replacement, patient able to take about 4-5 steps some days, contact-guard assist; PT OT ongoing.  History obtained from review of EMR, discussion with patient/family.   Review and summarization of Epic records shows history from other than patient. Rest of 10 point ROS asked and negative.  Palliative Care was asked to follow this patient by consultation request of Koirala, Dibas, MD to help address complex decision making in the context of advance care planning and goals of care clarification.   CODE STATUS: Full  PPS: 40%  HOSPICE  ELIGIBILITY/DIAGNOSIS: TBD  PAST MEDICAL HISTORY:  Past Medical History:  Diagnosis Date  . Arthritis   . Constipation    OCCASIONAL  . Coronary artery disease   . Diabetes mellitus without complication ([facility])   . Environmental allergies   . Hyperlipidemia   . Hypertension   . Inguinal hernia   . Iron deficiency   . Nocturia   . Parkinson disease ([facility])   . Peripheral arterial disease ([facility])     SOCIAL HX: @SOCX  Patient at home  for ongoing care   FAMILY HX:  Family History  Problem Relation Age of Onset  . Diabetes Mother   . Kidney disease Mother   . Stroke Father   . Stroke Brother   . Cancer Sister   . Cancer Sister   . Diabetes Sister   . Healthy Daughter   . Healthy Daughter   . Healthy Son     Review lab tests/diagnostics No results for input(s): WBC, HGB, HCT, PLT, MCV in the last 168 hours. No results for input(s): NA, K, CL, CO2, BUN, CREATININE, GLUCOSE in the last 168 hours. Latest GFR by Cockcroft Gault (not valid in AKI or ESRD) CrCl cannot be calculated (Patient's most recent lab result is older than the maximum 21 days allowed.). No results for input(s): AST, ALT, ALKPHOS, GGT in the last 168 hours.  Invalid input(s): TBILI, CONJBILI, ALB, TOTALPROTEIN No components found for: ALB No results for input(s): APTT, INR in the last 168 hours.  Invalid input(s): PTPATIENT No results for input(s): BNP, PROBNP in the last 168 hours.  ALLERGIES: No Known Allergies    PERTINENT MEDICATIONS:  Outpatient Encounter Medications as of 06/12/2020  Medication Sig  . acetaminophen (TYLENOL) 500 MG tablet Take 500 mg by mouth every 6 (six) hours as needed for moderate pain.  Marland Kitchen aspirin EC 81 MG EC tablet Take 1 tablet (81 mg total) by mouth 2 (two) times daily with a meal. Swallow whole.  Marland Kitchen atorvastatin (LIPITOR) 20 MG tablet Take 20 mg by mouth daily.   . carbidopa-levodopa (SINEMET CR) 50-200 MG tablet TAKE 1 TABLET BY MOUTH AT BEDTIME  . Carbidopa-Levodopa ER  (SINEMET CR) 25-100 MG tablet controlled release Take 1 tablet by mouth in the morning, at noon, in the evening, and at bedtime. (6am/10am/2pm/6pm)  . cholecalciferol (VITAMIN D3) 25 MCG (1000 UNIT) tablet Take 1,000 Units by mouth daily.  . Cyanocobalamin (VITAMIN B 12 PO) Take 1 tablet by mouth daily.  . ferrous sulfate 324 MG TBEC Take 324 mg by mouth daily at 6 (six) AM.  . metFORMIN (GLUCOPHAGE) 850 MG tablet Take 850 mg by mouth 3 (three) times a week. Mon/Wed/Fri  . ONETOUCH ULTRA test strip   . QUEtiapine (SEROQUEL) 25 MG tablet 1/2 in the AM and 1 at bed.  May add another 1/2 tablet in the late afternoon if need (Patient taking differently: Take 12.5 mg by mouth at bedtime.)  . senna (SENOKOT) 8.6 MG TABS tablet Take 1 tablet (8.6 mg total) by mouth 2 (two) times daily.  . [DISCONTINUED] pramipexole (MIRAPEX) 0.125 MG tablet Take 0.125 mg by mouth at bedtime.   Facility-Administered Encounter Medications as of 06/12/2020  Medication  . lidocaine-EPINEPHrine (XYLOCAINE W/EPI) 1 %-1:100000 (with pres) injection 10 mL    ROS  General: NAD EYES: denies vision changes ENMT: denies dysphagia no xerostomia Cardiovascular: denies chest pain Pulmonary: denies  cough, denies SOB  Abdomen: endorses fair appetite, no constipation or diarrhea GU: denies dysuria or urinary frequency MSK:  endorses  ROM limitations, occasional tremors in bilateral hands Skin: denies rashes or wounds Neurological: endorses weakness, denies insomnia Psych: Endorses positive mood Heme/lymph/immuno: denies bruises, abnormal bleeding  Physical exam deferred due to telehealth medicine.  Thank you for the opportunity to participate in the care of JACQUE GARRELS Please call our office at [phone] if we can be of additional assistance.  Note: Portions of this note were generated with Lobbyist. Dictation errors may occur despite best attempts at proofreading.  Teodoro Spray, NP

## 2020-06-13 DIAGNOSIS — E785 Hyperlipidemia, unspecified: Secondary | ICD-10-CM | POA: Diagnosis not present

## 2020-06-13 DIAGNOSIS — I6521 Occlusion and stenosis of right carotid artery: Secondary | ICD-10-CM | POA: Diagnosis not present

## 2020-06-13 DIAGNOSIS — Z96641 Presence of right artificial hip joint: Secondary | ICD-10-CM | POA: Diagnosis not present

## 2020-06-13 DIAGNOSIS — E611 Iron deficiency: Secondary | ICD-10-CM | POA: Diagnosis not present

## 2020-06-13 DIAGNOSIS — G20 Parkinson's disease: Secondary | ICD-10-CM | POA: Diagnosis not present

## 2020-06-13 DIAGNOSIS — L89153 Pressure ulcer of sacral region, stage 3: Secondary | ICD-10-CM | POA: Diagnosis not present

## 2020-06-13 DIAGNOSIS — Z7984 Long term (current) use of oral hypoglycemic drugs: Secondary | ICD-10-CM | POA: Diagnosis not present

## 2020-06-13 DIAGNOSIS — E1151 Type 2 diabetes mellitus with diabetic peripheral angiopathy without gangrene: Secondary | ICD-10-CM | POA: Diagnosis not present

## 2020-06-13 DIAGNOSIS — Z471 Aftercare following joint replacement surgery: Secondary | ICD-10-CM | POA: Diagnosis not present

## 2020-06-13 DIAGNOSIS — Z79899 Other long term (current) drug therapy: Secondary | ICD-10-CM | POA: Diagnosis not present

## 2020-06-13 DIAGNOSIS — G2581 Restless legs syndrome: Secondary | ICD-10-CM | POA: Diagnosis not present

## 2020-06-13 DIAGNOSIS — Z9181 History of falling: Secondary | ICD-10-CM | POA: Diagnosis not present

## 2020-06-13 DIAGNOSIS — I771 Stricture of artery: Secondary | ICD-10-CM | POA: Diagnosis not present

## 2020-06-13 DIAGNOSIS — E559 Vitamin D deficiency, unspecified: Secondary | ICD-10-CM | POA: Diagnosis not present

## 2020-06-13 DIAGNOSIS — Z87891 Personal history of nicotine dependence: Secondary | ICD-10-CM | POA: Diagnosis not present

## 2020-06-14 DIAGNOSIS — E611 Iron deficiency: Secondary | ICD-10-CM | POA: Diagnosis not present

## 2020-06-14 DIAGNOSIS — Z9181 History of falling: Secondary | ICD-10-CM | POA: Diagnosis not present

## 2020-06-14 DIAGNOSIS — Z471 Aftercare following joint replacement surgery: Secondary | ICD-10-CM | POA: Diagnosis not present

## 2020-06-14 DIAGNOSIS — G20 Parkinson's disease: Secondary | ICD-10-CM | POA: Diagnosis not present

## 2020-06-14 DIAGNOSIS — E1151 Type 2 diabetes mellitus with diabetic peripheral angiopathy without gangrene: Secondary | ICD-10-CM | POA: Diagnosis not present

## 2020-06-14 DIAGNOSIS — Z79899 Other long term (current) drug therapy: Secondary | ICD-10-CM | POA: Diagnosis not present

## 2020-06-14 DIAGNOSIS — Z96641 Presence of right artificial hip joint: Secondary | ICD-10-CM | POA: Diagnosis not present

## 2020-06-14 DIAGNOSIS — Z7984 Long term (current) use of oral hypoglycemic drugs: Secondary | ICD-10-CM | POA: Diagnosis not present

## 2020-06-14 DIAGNOSIS — G2581 Restless legs syndrome: Secondary | ICD-10-CM | POA: Diagnosis not present

## 2020-06-14 DIAGNOSIS — I6521 Occlusion and stenosis of right carotid artery: Secondary | ICD-10-CM | POA: Diagnosis not present

## 2020-06-14 DIAGNOSIS — Z87891 Personal history of nicotine dependence: Secondary | ICD-10-CM | POA: Diagnosis not present

## 2020-06-14 DIAGNOSIS — E559 Vitamin D deficiency, unspecified: Secondary | ICD-10-CM | POA: Diagnosis not present

## 2020-06-14 DIAGNOSIS — E785 Hyperlipidemia, unspecified: Secondary | ICD-10-CM | POA: Diagnosis not present

## 2020-06-14 DIAGNOSIS — L89153 Pressure ulcer of sacral region, stage 3: Secondary | ICD-10-CM | POA: Diagnosis not present

## 2020-06-14 DIAGNOSIS — I771 Stricture of artery: Secondary | ICD-10-CM | POA: Diagnosis not present

## 2020-06-15 ENCOUNTER — Ambulatory Visit (INDEPENDENT_AMBULATORY_CARE_PROVIDER_SITE_OTHER): Payer: Medicare Other

## 2020-06-15 DIAGNOSIS — R55 Syncope and collapse: Secondary | ICD-10-CM

## 2020-06-16 LAB — CUP PACEART REMOTE DEVICE CHECK
Date Time Interrogation Session: 20220317163022
Date Time Interrogation Session: 20220318040139
Implantable Pulse Generator Implant Date: 20210830
Implantable Pulse Generator Implant Date: 20210830

## 2020-06-17 ENCOUNTER — Other Ambulatory Visit: Payer: Self-pay | Admitting: Neurology

## 2020-06-19 DIAGNOSIS — Z471 Aftercare following joint replacement surgery: Secondary | ICD-10-CM | POA: Diagnosis not present

## 2020-06-19 DIAGNOSIS — G2581 Restless legs syndrome: Secondary | ICD-10-CM | POA: Diagnosis not present

## 2020-06-19 DIAGNOSIS — Z9181 History of falling: Secondary | ICD-10-CM | POA: Diagnosis not present

## 2020-06-19 DIAGNOSIS — L89153 Pressure ulcer of sacral region, stage 3: Secondary | ICD-10-CM | POA: Diagnosis not present

## 2020-06-19 DIAGNOSIS — Z87891 Personal history of nicotine dependence: Secondary | ICD-10-CM | POA: Diagnosis not present

## 2020-06-19 DIAGNOSIS — Z7984 Long term (current) use of oral hypoglycemic drugs: Secondary | ICD-10-CM | POA: Diagnosis not present

## 2020-06-19 DIAGNOSIS — Z79899 Other long term (current) drug therapy: Secondary | ICD-10-CM | POA: Diagnosis not present

## 2020-06-19 DIAGNOSIS — I6521 Occlusion and stenosis of right carotid artery: Secondary | ICD-10-CM | POA: Diagnosis not present

## 2020-06-19 DIAGNOSIS — G20 Parkinson's disease: Secondary | ICD-10-CM | POA: Diagnosis not present

## 2020-06-19 DIAGNOSIS — I771 Stricture of artery: Secondary | ICD-10-CM | POA: Diagnosis not present

## 2020-06-19 DIAGNOSIS — E785 Hyperlipidemia, unspecified: Secondary | ICD-10-CM | POA: Diagnosis not present

## 2020-06-19 DIAGNOSIS — Z96641 Presence of right artificial hip joint: Secondary | ICD-10-CM | POA: Diagnosis not present

## 2020-06-19 DIAGNOSIS — E559 Vitamin D deficiency, unspecified: Secondary | ICD-10-CM | POA: Diagnosis not present

## 2020-06-19 DIAGNOSIS — E611 Iron deficiency: Secondary | ICD-10-CM | POA: Diagnosis not present

## 2020-06-19 DIAGNOSIS — E1151 Type 2 diabetes mellitus with diabetic peripheral angiopathy without gangrene: Secondary | ICD-10-CM | POA: Diagnosis not present

## 2020-06-20 DIAGNOSIS — Z87891 Personal history of nicotine dependence: Secondary | ICD-10-CM | POA: Diagnosis not present

## 2020-06-20 DIAGNOSIS — E785 Hyperlipidemia, unspecified: Secondary | ICD-10-CM | POA: Diagnosis not present

## 2020-06-20 DIAGNOSIS — L89153 Pressure ulcer of sacral region, stage 3: Secondary | ICD-10-CM | POA: Diagnosis not present

## 2020-06-20 DIAGNOSIS — Z7984 Long term (current) use of oral hypoglycemic drugs: Secondary | ICD-10-CM | POA: Diagnosis not present

## 2020-06-20 DIAGNOSIS — Z96641 Presence of right artificial hip joint: Secondary | ICD-10-CM | POA: Diagnosis not present

## 2020-06-20 DIAGNOSIS — Z79899 Other long term (current) drug therapy: Secondary | ICD-10-CM | POA: Diagnosis not present

## 2020-06-20 DIAGNOSIS — Z471 Aftercare following joint replacement surgery: Secondary | ICD-10-CM | POA: Diagnosis not present

## 2020-06-20 DIAGNOSIS — G20 Parkinson's disease: Secondary | ICD-10-CM | POA: Diagnosis not present

## 2020-06-20 DIAGNOSIS — E559 Vitamin D deficiency, unspecified: Secondary | ICD-10-CM | POA: Diagnosis not present

## 2020-06-20 DIAGNOSIS — G2581 Restless legs syndrome: Secondary | ICD-10-CM | POA: Diagnosis not present

## 2020-06-20 DIAGNOSIS — E611 Iron deficiency: Secondary | ICD-10-CM | POA: Diagnosis not present

## 2020-06-20 DIAGNOSIS — E1151 Type 2 diabetes mellitus with diabetic peripheral angiopathy without gangrene: Secondary | ICD-10-CM | POA: Diagnosis not present

## 2020-06-20 DIAGNOSIS — Z9181 History of falling: Secondary | ICD-10-CM | POA: Diagnosis not present

## 2020-06-20 DIAGNOSIS — I771 Stricture of artery: Secondary | ICD-10-CM | POA: Diagnosis not present

## 2020-06-20 DIAGNOSIS — I6521 Occlusion and stenosis of right carotid artery: Secondary | ICD-10-CM | POA: Diagnosis not present

## 2020-06-21 DIAGNOSIS — E611 Iron deficiency: Secondary | ICD-10-CM | POA: Diagnosis not present

## 2020-06-21 DIAGNOSIS — Z79899 Other long term (current) drug therapy: Secondary | ICD-10-CM | POA: Diagnosis not present

## 2020-06-21 DIAGNOSIS — Z471 Aftercare following joint replacement surgery: Secondary | ICD-10-CM | POA: Diagnosis not present

## 2020-06-21 DIAGNOSIS — Z96641 Presence of right artificial hip joint: Secondary | ICD-10-CM | POA: Diagnosis not present

## 2020-06-21 DIAGNOSIS — E559 Vitamin D deficiency, unspecified: Secondary | ICD-10-CM | POA: Diagnosis not present

## 2020-06-21 DIAGNOSIS — Z9181 History of falling: Secondary | ICD-10-CM | POA: Diagnosis not present

## 2020-06-21 DIAGNOSIS — E785 Hyperlipidemia, unspecified: Secondary | ICD-10-CM | POA: Diagnosis not present

## 2020-06-21 DIAGNOSIS — I6521 Occlusion and stenosis of right carotid artery: Secondary | ICD-10-CM | POA: Diagnosis not present

## 2020-06-21 DIAGNOSIS — Z87891 Personal history of nicotine dependence: Secondary | ICD-10-CM | POA: Diagnosis not present

## 2020-06-21 DIAGNOSIS — I771 Stricture of artery: Secondary | ICD-10-CM | POA: Diagnosis not present

## 2020-06-21 DIAGNOSIS — G20 Parkinson's disease: Secondary | ICD-10-CM | POA: Diagnosis not present

## 2020-06-21 DIAGNOSIS — E1151 Type 2 diabetes mellitus with diabetic peripheral angiopathy without gangrene: Secondary | ICD-10-CM | POA: Diagnosis not present

## 2020-06-21 DIAGNOSIS — Z7984 Long term (current) use of oral hypoglycemic drugs: Secondary | ICD-10-CM | POA: Diagnosis not present

## 2020-06-21 DIAGNOSIS — G2581 Restless legs syndrome: Secondary | ICD-10-CM | POA: Diagnosis not present

## 2020-06-21 DIAGNOSIS — L89153 Pressure ulcer of sacral region, stage 3: Secondary | ICD-10-CM | POA: Diagnosis not present

## 2020-06-23 NOTE — Progress Notes (Signed)
Carelink Summary Report / Loop Recorder 

## 2020-06-26 DIAGNOSIS — I6521 Occlusion and stenosis of right carotid artery: Secondary | ICD-10-CM | POA: Diagnosis not present

## 2020-06-26 DIAGNOSIS — E559 Vitamin D deficiency, unspecified: Secondary | ICD-10-CM | POA: Diagnosis not present

## 2020-06-26 DIAGNOSIS — Z87891 Personal history of nicotine dependence: Secondary | ICD-10-CM | POA: Diagnosis not present

## 2020-06-26 DIAGNOSIS — L89153 Pressure ulcer of sacral region, stage 3: Secondary | ICD-10-CM | POA: Diagnosis not present

## 2020-06-26 DIAGNOSIS — E611 Iron deficiency: Secondary | ICD-10-CM | POA: Diagnosis not present

## 2020-06-26 DIAGNOSIS — E1151 Type 2 diabetes mellitus with diabetic peripheral angiopathy without gangrene: Secondary | ICD-10-CM | POA: Diagnosis not present

## 2020-06-26 DIAGNOSIS — G2581 Restless legs syndrome: Secondary | ICD-10-CM | POA: Diagnosis not present

## 2020-06-26 DIAGNOSIS — Z7984 Long term (current) use of oral hypoglycemic drugs: Secondary | ICD-10-CM | POA: Diagnosis not present

## 2020-06-26 DIAGNOSIS — Z471 Aftercare following joint replacement surgery: Secondary | ICD-10-CM | POA: Diagnosis not present

## 2020-06-26 DIAGNOSIS — I771 Stricture of artery: Secondary | ICD-10-CM | POA: Diagnosis not present

## 2020-06-26 DIAGNOSIS — Z96641 Presence of right artificial hip joint: Secondary | ICD-10-CM | POA: Diagnosis not present

## 2020-06-26 DIAGNOSIS — G20 Parkinson's disease: Secondary | ICD-10-CM | POA: Diagnosis not present

## 2020-06-26 DIAGNOSIS — Z9181 History of falling: Secondary | ICD-10-CM | POA: Diagnosis not present

## 2020-06-26 DIAGNOSIS — Z79899 Other long term (current) drug therapy: Secondary | ICD-10-CM | POA: Diagnosis not present

## 2020-06-26 DIAGNOSIS — E785 Hyperlipidemia, unspecified: Secondary | ICD-10-CM | POA: Diagnosis not present

## 2020-06-27 DIAGNOSIS — E559 Vitamin D deficiency, unspecified: Secondary | ICD-10-CM | POA: Diagnosis not present

## 2020-06-27 DIAGNOSIS — G20 Parkinson's disease: Secondary | ICD-10-CM | POA: Diagnosis not present

## 2020-06-27 DIAGNOSIS — Z96641 Presence of right artificial hip joint: Secondary | ICD-10-CM | POA: Diagnosis not present

## 2020-06-27 DIAGNOSIS — I6521 Occlusion and stenosis of right carotid artery: Secondary | ICD-10-CM | POA: Diagnosis not present

## 2020-06-27 DIAGNOSIS — I771 Stricture of artery: Secondary | ICD-10-CM | POA: Diagnosis not present

## 2020-06-27 DIAGNOSIS — E611 Iron deficiency: Secondary | ICD-10-CM | POA: Diagnosis not present

## 2020-06-27 DIAGNOSIS — Z87891 Personal history of nicotine dependence: Secondary | ICD-10-CM | POA: Diagnosis not present

## 2020-06-27 DIAGNOSIS — L89153 Pressure ulcer of sacral region, stage 3: Secondary | ICD-10-CM | POA: Diagnosis not present

## 2020-06-27 DIAGNOSIS — Z7984 Long term (current) use of oral hypoglycemic drugs: Secondary | ICD-10-CM | POA: Diagnosis not present

## 2020-06-27 DIAGNOSIS — E785 Hyperlipidemia, unspecified: Secondary | ICD-10-CM | POA: Diagnosis not present

## 2020-06-27 DIAGNOSIS — G2581 Restless legs syndrome: Secondary | ICD-10-CM | POA: Diagnosis not present

## 2020-06-27 DIAGNOSIS — Z471 Aftercare following joint replacement surgery: Secondary | ICD-10-CM | POA: Diagnosis not present

## 2020-06-27 DIAGNOSIS — Z9181 History of falling: Secondary | ICD-10-CM | POA: Diagnosis not present

## 2020-06-27 DIAGNOSIS — E1151 Type 2 diabetes mellitus with diabetic peripheral angiopathy without gangrene: Secondary | ICD-10-CM | POA: Diagnosis not present

## 2020-06-27 DIAGNOSIS — Z79899 Other long term (current) drug therapy: Secondary | ICD-10-CM | POA: Diagnosis not present

## 2020-06-29 ENCOUNTER — Other Ambulatory Visit: Payer: Medicare Other | Admitting: Hospice

## 2020-06-29 ENCOUNTER — Other Ambulatory Visit: Payer: Self-pay

## 2020-06-29 DIAGNOSIS — Z515 Encounter for palliative care: Secondary | ICD-10-CM

## 2020-06-29 DIAGNOSIS — G20 Parkinson's disease: Secondary | ICD-10-CM | POA: Diagnosis not present

## 2020-06-29 DIAGNOSIS — R410 Disorientation, unspecified: Secondary | ICD-10-CM

## 2020-06-29 DIAGNOSIS — R059 Cough, unspecified: Secondary | ICD-10-CM | POA: Diagnosis not present

## 2020-06-29 NOTE — Progress Notes (Signed)
[facility] Consult Note Telephone: [phone]  Fax: [phone]  PATIENT NAME: Rodney Love DOB: [birth year] MRN: [id number]  PRIMARY CARE PROVIDER:   Lujean Amel, MD Lujean Amel, [place] [address],  [place] [address]  REFERRING PROVIDER: Lujean Amel, MD Lujean Amel, MD [place] [address],  [place] [address]  RESPONSIBLE PARTY:Self: [phone] h best number to call [facility] [phone] Extended Emergency Contact Information Primary Emergency Contact: Dallas Breeding For emails Home Phone: [phone] Mobile Phone: [phone] Relation: Daughter Secondary Emergency Contact: Ellender Hose Address: [address], [place] of [place] Phone: [phone] Mobile Phone: [phone] Relation: Spouse- HCPOA [name] - daughter [phone] the oldest daughter lives in [place] Due to the COVID-19 crisis, this visit was done via telemedicine and it was initiated and consent by this patient and or family. Video-audio (telehealth) contact was unable to be done due to technical barriers from the patient's side.   Visit is to build trust and highlight Palliative Medicine as specialized medical care for people living with serious illness, aimed at facilitating better quality of life through symptoms relief, assisting with advance care plan and establishing goals of care.  Merleen Nicely and [place] are present with patient during visit.  CHIEF COMPLAINT: Palliative follow up visit/Confusion  RECOMMENDATIONS/PLAN:   1. Advance Care Planning/Code Status: Reviewed CODE STATUS today patient remains a full code.  2. Goals of Care: Goals of care include to maximize quality of life and symptom management. Patient wishes to be able to ambulate and regain some independence in his activities of daily  living.   Visit consisted of counseling and education dealing with the complex and emotionally intense issues of symptom management and palliative care in the setting of serious and potentially life-threatening illness. Palliative care team will continue to support patient, patient's family, and medical team.  I spent 16  minutes providing this consultation. More than 50% of the time in this consultation was spent on coordinating communication.  -------------------------------------------------------------------------------------------------------------------------------------------------- 3. Symptom management/Plan:  Confusion: UA and CS and treat with appropriate antibiotic. Redirection and distraction, and Fall/safety precautions discussed.  Weakness: PT/OT is ongoing. Patient still not able to walk, contact guard assist to stand and pivot.  Continue Sinemet as ordered. Continue PT/OT and in bed ROM exercises for stiffness. Follow up with Neurologist as planned.  R hip pain has reduced significantly. Continue Tylenol as ordered prn pain.  Palliative will continue to monitor for symptom management/decline and make recommendations as needed. Return 2 months or prn. Encouraged to call provider sooner with any concerns.   HISTORY OF PRESENT ILLNESS:  Rodney Love is a 75 y.o. male with multiple medical problems including confusion. Spouse reports confusion is chronic but has worsened in the past 5 days, worst during the evening impairing his quality of life. Sometimes redirection and distraction is helpful but not always. Urine sample to be collected tomorrow per order from PCP. Report of increasing stiffness and weakness which impair his ability of walk. Patient is continuing with PT/OT; adherent to his medications - Sinemet. [name] reports scheduled  Neurologist follow up May 24 th 2022. History of Parkinson's disease, hypertension, CAD-patient has a loop recorder, Syncope.  History obtained from  review of EMR, discussion with family, and/or patient. Records reviewed and summarized above. All 10 point systems reviewed and are negative except as documented in  history of present illness above  Review and summarization of Epic records shows history from other than patient.   Palliative Care was asked to follow this patient by consultation request of Koirala, Dibas, MD to help address complex decision making in the context of advance care planning and goals of care clarification.   CODE STATUS: Full code  PPS: 40%  HOSPICE ELIGIBILITY/DIAGNOSIS: TBD  PAST MEDICAL HISTORY:  Past Medical History:  Diagnosis Date  . Arthritis   . Constipation    OCCASIONAL  . Coronary artery disease   . Diabetes mellitus without complication ([facility])   . Environmental allergies   . Hyperlipidemia   . Hypertension   . Inguinal hernia   . Iron deficiency   . Nocturia   . Parkinson disease ([facility])   . Peripheral arterial disease ([facility])      SOCIAL HX: @SOCX  Patient lives at home with his spouse for ongoing care   FAMILY HX:  Family History  Problem Relation Age of Onset  . Diabetes Mother   . Kidney disease Mother   . Stroke Father   . Stroke Brother   . Cancer Sister   . Cancer Sister   . Diabetes Sister   . Healthy Daughter   . Healthy Daughter   . Healthy Son     Review lab tests/diagnostics No results for input(s): WBC, HGB, HCT, PLT, MCV in the last 168 hours. No results for input(s): NA, K, CL, CO2, BUN, CREATININE, GLUCOSE in the last 168 hours. Latest GFR by Cockcroft Gault (not valid in AKI or ESRD) CrCl cannot be calculated (Patient's most recent lab result is older than the maximum 21 days allowed.).  ALLERGIES: No Known Allergies    PERTINENT MEDICATIONS:  Outpatient Encounter Medications as of 06/29/2020  Medication Sig  . acetaminophen (TYLENOL) 500 MG tablet Take 500 mg by mouth every 6 (six) hours as needed for moderate pain.  Marland Kitchen atorvastatin (LIPITOR) 20 MG tablet  Take 20 mg by mouth daily.   . carbidopa-levodopa (SINEMET CR) 50-200 MG tablet TAKE 1 TABLET BY MOUTH AT BEDTIME  . Carbidopa-Levodopa ER (SINEMET CR) 25-100 MG tablet controlled release TAKE 1 TABLET BY MOUTH IN THE MORNING, AT NOON, IN THE EVENING, AND AT BEDTIME.(6AM/10AM/2PM/6PM)  . cholecalciferol (VITAMIN D3) 25 MCG (1000 UNIT) tablet Take 1,000 Units by mouth daily.  . Cyanocobalamin (VITAMIN B 12 PO) Take 1 tablet by mouth daily.  . ferrous sulfate 324 MG TBEC Take 324 mg by mouth daily at 6 (six) AM.  . metFORMIN (GLUCOPHAGE) 850 MG tablet Take 850 mg by mouth 3 (three) times a week. Mon/Wed/Fri  . ONETOUCH ULTRA test strip   . QUEtiapine (SEROQUEL) 25 MG tablet 1/2 in the AM and 1 at bed.  May add another 1/2 tablet in the late afternoon if need (Patient taking differently: Take 12.5 mg by mouth at bedtime.)  . senna (SENOKOT) 8.6 MG TABS tablet Take 1 tablet (8.6 mg total) by mouth 2 (two) times daily.  . [DISCONTINUED] pramipexole (MIRAPEX) 0.125 MG tablet Take 0.125 mg by mouth at bedtime.   Facility-Administered Encounter Medications as of 06/29/2020  Medication  . lidocaine-EPINEPHrine (XYLOCAINE W/EPI) 1 %-1:100000 (with pres) injection 10 mL     Physical exam deferred due to telehealth medicine.    Thank you for the opportunity to participate in the care of ANIRUDDH CIAVARELLA Please call our office at [phone] if we can be of additional assistance.  Note: Portions of this note were generated with  Lobbyist. Dictation errors may occur despite best attempts at proofreading.  Teodoro Spray, NP

## 2020-06-30 DIAGNOSIS — E611 Iron deficiency: Secondary | ICD-10-CM | POA: Diagnosis not present

## 2020-06-30 DIAGNOSIS — E1151 Type 2 diabetes mellitus with diabetic peripheral angiopathy without gangrene: Secondary | ICD-10-CM | POA: Diagnosis not present

## 2020-06-30 DIAGNOSIS — Z9181 History of falling: Secondary | ICD-10-CM | POA: Diagnosis not present

## 2020-06-30 DIAGNOSIS — Z7984 Long term (current) use of oral hypoglycemic drugs: Secondary | ICD-10-CM | POA: Diagnosis not present

## 2020-06-30 DIAGNOSIS — E559 Vitamin D deficiency, unspecified: Secondary | ICD-10-CM | POA: Diagnosis not present

## 2020-06-30 DIAGNOSIS — I771 Stricture of artery: Secondary | ICD-10-CM | POA: Diagnosis not present

## 2020-06-30 DIAGNOSIS — G20 Parkinson's disease: Secondary | ICD-10-CM | POA: Diagnosis not present

## 2020-06-30 DIAGNOSIS — Z96641 Presence of right artificial hip joint: Secondary | ICD-10-CM | POA: Diagnosis not present

## 2020-06-30 DIAGNOSIS — E785 Hyperlipidemia, unspecified: Secondary | ICD-10-CM | POA: Diagnosis not present

## 2020-06-30 DIAGNOSIS — Z87891 Personal history of nicotine dependence: Secondary | ICD-10-CM | POA: Diagnosis not present

## 2020-06-30 DIAGNOSIS — Z79899 Other long term (current) drug therapy: Secondary | ICD-10-CM | POA: Diagnosis not present

## 2020-06-30 DIAGNOSIS — Z471 Aftercare following joint replacement surgery: Secondary | ICD-10-CM | POA: Diagnosis not present

## 2020-06-30 DIAGNOSIS — I6521 Occlusion and stenosis of right carotid artery: Secondary | ICD-10-CM | POA: Diagnosis not present

## 2020-06-30 DIAGNOSIS — G2581 Restless legs syndrome: Secondary | ICD-10-CM | POA: Diagnosis not present

## 2020-06-30 DIAGNOSIS — L89153 Pressure ulcer of sacral region, stage 3: Secondary | ICD-10-CM | POA: Diagnosis not present

## 2020-07-03 DIAGNOSIS — S7291XA Unspecified fracture of right femur, initial encounter for closed fracture: Secondary | ICD-10-CM | POA: Diagnosis not present

## 2020-07-03 DIAGNOSIS — S72001S Fracture of unspecified part of neck of right femur, sequela: Secondary | ICD-10-CM | POA: Diagnosis not present

## 2020-07-03 DIAGNOSIS — G20 Parkinson's disease: Secondary | ICD-10-CM | POA: Diagnosis not present

## 2020-07-03 DIAGNOSIS — I739 Peripheral vascular disease, unspecified: Secondary | ICD-10-CM | POA: Diagnosis not present

## 2020-07-04 DIAGNOSIS — L89153 Pressure ulcer of sacral region, stage 3: Secondary | ICD-10-CM | POA: Diagnosis not present

## 2020-07-04 DIAGNOSIS — Z7984 Long term (current) use of oral hypoglycemic drugs: Secondary | ICD-10-CM | POA: Diagnosis not present

## 2020-07-04 DIAGNOSIS — G20 Parkinson's disease: Secondary | ICD-10-CM | POA: Diagnosis not present

## 2020-07-04 DIAGNOSIS — R410 Disorientation, unspecified: Secondary | ICD-10-CM | POA: Diagnosis not present

## 2020-07-04 DIAGNOSIS — E1169 Type 2 diabetes mellitus with other specified complication: Secondary | ICD-10-CM | POA: Diagnosis not present

## 2020-07-04 DIAGNOSIS — Z9181 History of falling: Secondary | ICD-10-CM | POA: Diagnosis not present

## 2020-07-04 DIAGNOSIS — I6521 Occlusion and stenosis of right carotid artery: Secondary | ICD-10-CM | POA: Diagnosis not present

## 2020-07-04 DIAGNOSIS — I771 Stricture of artery: Secondary | ICD-10-CM | POA: Diagnosis not present

## 2020-07-04 DIAGNOSIS — E1151 Type 2 diabetes mellitus with diabetic peripheral angiopathy without gangrene: Secondary | ICD-10-CM | POA: Diagnosis not present

## 2020-07-04 DIAGNOSIS — Z87891 Personal history of nicotine dependence: Secondary | ICD-10-CM | POA: Diagnosis not present

## 2020-07-04 DIAGNOSIS — E611 Iron deficiency: Secondary | ICD-10-CM | POA: Diagnosis not present

## 2020-07-04 DIAGNOSIS — Z96641 Presence of right artificial hip joint: Secondary | ICD-10-CM | POA: Diagnosis not present

## 2020-07-04 DIAGNOSIS — G2581 Restless legs syndrome: Secondary | ICD-10-CM | POA: Diagnosis not present

## 2020-07-04 DIAGNOSIS — Z79899 Other long term (current) drug therapy: Secondary | ICD-10-CM | POA: Diagnosis not present

## 2020-07-04 DIAGNOSIS — E559 Vitamin D deficiency, unspecified: Secondary | ICD-10-CM | POA: Diagnosis not present

## 2020-07-04 DIAGNOSIS — E785 Hyperlipidemia, unspecified: Secondary | ICD-10-CM | POA: Diagnosis not present

## 2020-07-04 DIAGNOSIS — Z471 Aftercare following joint replacement surgery: Secondary | ICD-10-CM | POA: Diagnosis not present

## 2020-07-05 DIAGNOSIS — G2581 Restless legs syndrome: Secondary | ICD-10-CM | POA: Diagnosis not present

## 2020-07-05 DIAGNOSIS — Z79899 Other long term (current) drug therapy: Secondary | ICD-10-CM | POA: Diagnosis not present

## 2020-07-05 DIAGNOSIS — Z96641 Presence of right artificial hip joint: Secondary | ICD-10-CM | POA: Diagnosis not present

## 2020-07-05 DIAGNOSIS — I771 Stricture of artery: Secondary | ICD-10-CM | POA: Diagnosis not present

## 2020-07-05 DIAGNOSIS — Z7984 Long term (current) use of oral hypoglycemic drugs: Secondary | ICD-10-CM | POA: Diagnosis not present

## 2020-07-05 DIAGNOSIS — Z87891 Personal history of nicotine dependence: Secondary | ICD-10-CM | POA: Diagnosis not present

## 2020-07-05 DIAGNOSIS — E785 Hyperlipidemia, unspecified: Secondary | ICD-10-CM | POA: Diagnosis not present

## 2020-07-05 DIAGNOSIS — G20 Parkinson's disease: Secondary | ICD-10-CM | POA: Diagnosis not present

## 2020-07-05 DIAGNOSIS — Z9181 History of falling: Secondary | ICD-10-CM | POA: Diagnosis not present

## 2020-07-05 DIAGNOSIS — E611 Iron deficiency: Secondary | ICD-10-CM | POA: Diagnosis not present

## 2020-07-05 DIAGNOSIS — L89153 Pressure ulcer of sacral region, stage 3: Secondary | ICD-10-CM | POA: Diagnosis not present

## 2020-07-05 DIAGNOSIS — E559 Vitamin D deficiency, unspecified: Secondary | ICD-10-CM | POA: Diagnosis not present

## 2020-07-05 DIAGNOSIS — Z471 Aftercare following joint replacement surgery: Secondary | ICD-10-CM | POA: Diagnosis not present

## 2020-07-05 DIAGNOSIS — I6521 Occlusion and stenosis of right carotid artery: Secondary | ICD-10-CM | POA: Diagnosis not present

## 2020-07-05 DIAGNOSIS — E1151 Type 2 diabetes mellitus with diabetic peripheral angiopathy without gangrene: Secondary | ICD-10-CM | POA: Diagnosis not present

## 2020-07-07 DIAGNOSIS — G20 Parkinson's disease: Secondary | ICD-10-CM | POA: Diagnosis not present

## 2020-07-07 DIAGNOSIS — E785 Hyperlipidemia, unspecified: Secondary | ICD-10-CM | POA: Diagnosis not present

## 2020-07-07 DIAGNOSIS — Z87891 Personal history of nicotine dependence: Secondary | ICD-10-CM | POA: Diagnosis not present

## 2020-07-07 DIAGNOSIS — I6521 Occlusion and stenosis of right carotid artery: Secondary | ICD-10-CM | POA: Diagnosis not present

## 2020-07-07 DIAGNOSIS — I771 Stricture of artery: Secondary | ICD-10-CM | POA: Diagnosis not present

## 2020-07-07 DIAGNOSIS — Z471 Aftercare following joint replacement surgery: Secondary | ICD-10-CM | POA: Diagnosis not present

## 2020-07-07 DIAGNOSIS — E1151 Type 2 diabetes mellitus with diabetic peripheral angiopathy without gangrene: Secondary | ICD-10-CM | POA: Diagnosis not present

## 2020-07-07 DIAGNOSIS — E559 Vitamin D deficiency, unspecified: Secondary | ICD-10-CM | POA: Diagnosis not present

## 2020-07-07 DIAGNOSIS — Z7984 Long term (current) use of oral hypoglycemic drugs: Secondary | ICD-10-CM | POA: Diagnosis not present

## 2020-07-07 DIAGNOSIS — E611 Iron deficiency: Secondary | ICD-10-CM | POA: Diagnosis not present

## 2020-07-07 DIAGNOSIS — L89153 Pressure ulcer of sacral region, stage 3: Secondary | ICD-10-CM | POA: Diagnosis not present

## 2020-07-07 DIAGNOSIS — Z96641 Presence of right artificial hip joint: Secondary | ICD-10-CM | POA: Diagnosis not present

## 2020-07-07 DIAGNOSIS — Z79899 Other long term (current) drug therapy: Secondary | ICD-10-CM | POA: Diagnosis not present

## 2020-07-07 DIAGNOSIS — Z9181 History of falling: Secondary | ICD-10-CM | POA: Diagnosis not present

## 2020-07-07 DIAGNOSIS — G2581 Restless legs syndrome: Secondary | ICD-10-CM | POA: Diagnosis not present

## 2020-07-11 ENCOUNTER — Other Ambulatory Visit (HOSPITAL_COMMUNITY): Payer: Self-pay | Admitting: Cardiovascular Disease

## 2020-07-11 DIAGNOSIS — Z7984 Long term (current) use of oral hypoglycemic drugs: Secondary | ICD-10-CM | POA: Diagnosis not present

## 2020-07-11 DIAGNOSIS — I69820 Aphasia following other cerebrovascular disease: Secondary | ICD-10-CM | POA: Diagnosis not present

## 2020-07-11 DIAGNOSIS — R498 Other voice and resonance disorders: Secondary | ICD-10-CM | POA: Diagnosis not present

## 2020-07-11 DIAGNOSIS — I739 Peripheral vascular disease, unspecified: Secondary | ICD-10-CM

## 2020-07-11 DIAGNOSIS — S72031D Displaced midcervical fracture of right femur, subsequent encounter for closed fracture with routine healing: Secondary | ICD-10-CM | POA: Diagnosis not present

## 2020-07-11 DIAGNOSIS — Z87891 Personal history of nicotine dependence: Secondary | ICD-10-CM | POA: Diagnosis not present

## 2020-07-11 DIAGNOSIS — G20 Parkinson's disease: Secondary | ICD-10-CM | POA: Diagnosis not present

## 2020-07-11 DIAGNOSIS — I771 Stricture of artery: Secondary | ICD-10-CM | POA: Diagnosis not present

## 2020-07-11 DIAGNOSIS — I6521 Occlusion and stenosis of right carotid artery: Secondary | ICD-10-CM | POA: Diagnosis not present

## 2020-07-11 DIAGNOSIS — Z96641 Presence of right artificial hip joint: Secondary | ICD-10-CM | POA: Diagnosis not present

## 2020-07-11 DIAGNOSIS — Z79899 Other long term (current) drug therapy: Secondary | ICD-10-CM | POA: Diagnosis not present

## 2020-07-11 DIAGNOSIS — E785 Hyperlipidemia, unspecified: Secondary | ICD-10-CM | POA: Diagnosis not present

## 2020-07-11 DIAGNOSIS — Z471 Aftercare following joint replacement surgery: Secondary | ICD-10-CM | POA: Diagnosis not present

## 2020-07-11 DIAGNOSIS — Z9181 History of falling: Secondary | ICD-10-CM | POA: Diagnosis not present

## 2020-07-11 DIAGNOSIS — E1151 Type 2 diabetes mellitus with diabetic peripheral angiopathy without gangrene: Secondary | ICD-10-CM | POA: Diagnosis not present

## 2020-07-11 DIAGNOSIS — G2581 Restless legs syndrome: Secondary | ICD-10-CM | POA: Diagnosis not present

## 2020-07-12 DIAGNOSIS — Z96641 Presence of right artificial hip joint: Secondary | ICD-10-CM | POA: Diagnosis not present

## 2020-07-12 DIAGNOSIS — I771 Stricture of artery: Secondary | ICD-10-CM | POA: Diagnosis not present

## 2020-07-12 DIAGNOSIS — Z79899 Other long term (current) drug therapy: Secondary | ICD-10-CM | POA: Diagnosis not present

## 2020-07-12 DIAGNOSIS — Z7984 Long term (current) use of oral hypoglycemic drugs: Secondary | ICD-10-CM | POA: Diagnosis not present

## 2020-07-12 DIAGNOSIS — I69820 Aphasia following other cerebrovascular disease: Secondary | ICD-10-CM | POA: Diagnosis not present

## 2020-07-12 DIAGNOSIS — Z87891 Personal history of nicotine dependence: Secondary | ICD-10-CM | POA: Diagnosis not present

## 2020-07-12 DIAGNOSIS — I6521 Occlusion and stenosis of right carotid artery: Secondary | ICD-10-CM | POA: Diagnosis not present

## 2020-07-12 DIAGNOSIS — Z471 Aftercare following joint replacement surgery: Secondary | ICD-10-CM | POA: Diagnosis not present

## 2020-07-12 DIAGNOSIS — R498 Other voice and resonance disorders: Secondary | ICD-10-CM | POA: Diagnosis not present

## 2020-07-12 DIAGNOSIS — Z9181 History of falling: Secondary | ICD-10-CM | POA: Diagnosis not present

## 2020-07-12 DIAGNOSIS — G20 Parkinson's disease: Secondary | ICD-10-CM | POA: Diagnosis not present

## 2020-07-12 DIAGNOSIS — G2581 Restless legs syndrome: Secondary | ICD-10-CM | POA: Diagnosis not present

## 2020-07-12 DIAGNOSIS — E785 Hyperlipidemia, unspecified: Secondary | ICD-10-CM | POA: Diagnosis not present

## 2020-07-12 DIAGNOSIS — E1151 Type 2 diabetes mellitus with diabetic peripheral angiopathy without gangrene: Secondary | ICD-10-CM | POA: Diagnosis not present

## 2020-07-13 DIAGNOSIS — E785 Hyperlipidemia, unspecified: Secondary | ICD-10-CM | POA: Diagnosis not present

## 2020-07-13 DIAGNOSIS — Z96641 Presence of right artificial hip joint: Secondary | ICD-10-CM | POA: Diagnosis not present

## 2020-07-13 DIAGNOSIS — I771 Stricture of artery: Secondary | ICD-10-CM | POA: Diagnosis not present

## 2020-07-13 DIAGNOSIS — I69820 Aphasia following other cerebrovascular disease: Secondary | ICD-10-CM | POA: Diagnosis not present

## 2020-07-13 DIAGNOSIS — Z9181 History of falling: Secondary | ICD-10-CM | POA: Diagnosis not present

## 2020-07-13 DIAGNOSIS — G2581 Restless legs syndrome: Secondary | ICD-10-CM | POA: Diagnosis not present

## 2020-07-13 DIAGNOSIS — E1151 Type 2 diabetes mellitus with diabetic peripheral angiopathy without gangrene: Secondary | ICD-10-CM | POA: Diagnosis not present

## 2020-07-13 DIAGNOSIS — R498 Other voice and resonance disorders: Secondary | ICD-10-CM | POA: Diagnosis not present

## 2020-07-13 DIAGNOSIS — Z79899 Other long term (current) drug therapy: Secondary | ICD-10-CM | POA: Diagnosis not present

## 2020-07-13 DIAGNOSIS — Z7984 Long term (current) use of oral hypoglycemic drugs: Secondary | ICD-10-CM | POA: Diagnosis not present

## 2020-07-13 DIAGNOSIS — Z471 Aftercare following joint replacement surgery: Secondary | ICD-10-CM | POA: Diagnosis not present

## 2020-07-13 DIAGNOSIS — G20 Parkinson's disease: Secondary | ICD-10-CM | POA: Diagnosis not present

## 2020-07-13 DIAGNOSIS — Z87891 Personal history of nicotine dependence: Secondary | ICD-10-CM | POA: Diagnosis not present

## 2020-07-13 DIAGNOSIS — I6521 Occlusion and stenosis of right carotid artery: Secondary | ICD-10-CM | POA: Diagnosis not present

## 2020-07-14 DIAGNOSIS — Z87891 Personal history of nicotine dependence: Secondary | ICD-10-CM | POA: Diagnosis not present

## 2020-07-14 DIAGNOSIS — Z471 Aftercare following joint replacement surgery: Secondary | ICD-10-CM | POA: Diagnosis not present

## 2020-07-14 DIAGNOSIS — R498 Other voice and resonance disorders: Secondary | ICD-10-CM | POA: Diagnosis not present

## 2020-07-14 DIAGNOSIS — I6521 Occlusion and stenosis of right carotid artery: Secondary | ICD-10-CM | POA: Diagnosis not present

## 2020-07-14 DIAGNOSIS — I69820 Aphasia following other cerebrovascular disease: Secondary | ICD-10-CM | POA: Diagnosis not present

## 2020-07-14 DIAGNOSIS — G2581 Restless legs syndrome: Secondary | ICD-10-CM | POA: Diagnosis not present

## 2020-07-14 DIAGNOSIS — G20 Parkinson's disease: Secondary | ICD-10-CM | POA: Diagnosis not present

## 2020-07-14 DIAGNOSIS — I771 Stricture of artery: Secondary | ICD-10-CM | POA: Diagnosis not present

## 2020-07-14 DIAGNOSIS — Z96641 Presence of right artificial hip joint: Secondary | ICD-10-CM | POA: Diagnosis not present

## 2020-07-14 DIAGNOSIS — Z9181 History of falling: Secondary | ICD-10-CM | POA: Diagnosis not present

## 2020-07-14 DIAGNOSIS — Z79899 Other long term (current) drug therapy: Secondary | ICD-10-CM | POA: Diagnosis not present

## 2020-07-14 DIAGNOSIS — E785 Hyperlipidemia, unspecified: Secondary | ICD-10-CM | POA: Diagnosis not present

## 2020-07-14 DIAGNOSIS — Z7984 Long term (current) use of oral hypoglycemic drugs: Secondary | ICD-10-CM | POA: Diagnosis not present

## 2020-07-14 DIAGNOSIS — E1151 Type 2 diabetes mellitus with diabetic peripheral angiopathy without gangrene: Secondary | ICD-10-CM | POA: Diagnosis not present

## 2020-07-17 DIAGNOSIS — Z79899 Other long term (current) drug therapy: Secondary | ICD-10-CM | POA: Diagnosis not present

## 2020-07-17 DIAGNOSIS — Z96641 Presence of right artificial hip joint: Secondary | ICD-10-CM | POA: Diagnosis not present

## 2020-07-17 DIAGNOSIS — E785 Hyperlipidemia, unspecified: Secondary | ICD-10-CM | POA: Diagnosis not present

## 2020-07-17 DIAGNOSIS — Z471 Aftercare following joint replacement surgery: Secondary | ICD-10-CM | POA: Diagnosis not present

## 2020-07-17 DIAGNOSIS — I771 Stricture of artery: Secondary | ICD-10-CM | POA: Diagnosis not present

## 2020-07-17 DIAGNOSIS — R498 Other voice and resonance disorders: Secondary | ICD-10-CM | POA: Diagnosis not present

## 2020-07-17 DIAGNOSIS — E1151 Type 2 diabetes mellitus with diabetic peripheral angiopathy without gangrene: Secondary | ICD-10-CM | POA: Diagnosis not present

## 2020-07-17 DIAGNOSIS — I69820 Aphasia following other cerebrovascular disease: Secondary | ICD-10-CM | POA: Diagnosis not present

## 2020-07-17 DIAGNOSIS — I6521 Occlusion and stenosis of right carotid artery: Secondary | ICD-10-CM | POA: Diagnosis not present

## 2020-07-17 DIAGNOSIS — Z9181 History of falling: Secondary | ICD-10-CM | POA: Diagnosis not present

## 2020-07-17 DIAGNOSIS — Z87891 Personal history of nicotine dependence: Secondary | ICD-10-CM | POA: Diagnosis not present

## 2020-07-17 DIAGNOSIS — G20 Parkinson's disease: Secondary | ICD-10-CM | POA: Diagnosis not present

## 2020-07-17 DIAGNOSIS — Z7984 Long term (current) use of oral hypoglycemic drugs: Secondary | ICD-10-CM | POA: Diagnosis not present

## 2020-07-17 DIAGNOSIS — G2581 Restless legs syndrome: Secondary | ICD-10-CM | POA: Diagnosis not present

## 2020-07-18 ENCOUNTER — Ambulatory Visit (INDEPENDENT_AMBULATORY_CARE_PROVIDER_SITE_OTHER): Payer: Medicare Other

## 2020-07-18 DIAGNOSIS — E1151 Type 2 diabetes mellitus with diabetic peripheral angiopathy without gangrene: Secondary | ICD-10-CM | POA: Diagnosis not present

## 2020-07-18 DIAGNOSIS — R498 Other voice and resonance disorders: Secondary | ICD-10-CM | POA: Diagnosis not present

## 2020-07-18 DIAGNOSIS — E785 Hyperlipidemia, unspecified: Secondary | ICD-10-CM | POA: Diagnosis not present

## 2020-07-18 DIAGNOSIS — R55 Syncope and collapse: Secondary | ICD-10-CM

## 2020-07-18 DIAGNOSIS — Z9181 History of falling: Secondary | ICD-10-CM | POA: Diagnosis not present

## 2020-07-18 DIAGNOSIS — Z7984 Long term (current) use of oral hypoglycemic drugs: Secondary | ICD-10-CM | POA: Diagnosis not present

## 2020-07-18 DIAGNOSIS — G20 Parkinson's disease: Secondary | ICD-10-CM | POA: Diagnosis not present

## 2020-07-18 DIAGNOSIS — I6521 Occlusion and stenosis of right carotid artery: Secondary | ICD-10-CM | POA: Diagnosis not present

## 2020-07-18 DIAGNOSIS — Z79899 Other long term (current) drug therapy: Secondary | ICD-10-CM | POA: Diagnosis not present

## 2020-07-18 DIAGNOSIS — Z471 Aftercare following joint replacement surgery: Secondary | ICD-10-CM | POA: Diagnosis not present

## 2020-07-18 DIAGNOSIS — Z87891 Personal history of nicotine dependence: Secondary | ICD-10-CM | POA: Diagnosis not present

## 2020-07-18 DIAGNOSIS — G2581 Restless legs syndrome: Secondary | ICD-10-CM | POA: Diagnosis not present

## 2020-07-18 DIAGNOSIS — I69820 Aphasia following other cerebrovascular disease: Secondary | ICD-10-CM | POA: Diagnosis not present

## 2020-07-18 DIAGNOSIS — Z96641 Presence of right artificial hip joint: Secondary | ICD-10-CM | POA: Diagnosis not present

## 2020-07-18 DIAGNOSIS — I771 Stricture of artery: Secondary | ICD-10-CM | POA: Diagnosis not present

## 2020-07-19 DIAGNOSIS — I6521 Occlusion and stenosis of right carotid artery: Secondary | ICD-10-CM | POA: Diagnosis not present

## 2020-07-19 DIAGNOSIS — Z96641 Presence of right artificial hip joint: Secondary | ICD-10-CM | POA: Diagnosis not present

## 2020-07-19 DIAGNOSIS — R498 Other voice and resonance disorders: Secondary | ICD-10-CM | POA: Diagnosis not present

## 2020-07-19 DIAGNOSIS — E1151 Type 2 diabetes mellitus with diabetic peripheral angiopathy without gangrene: Secondary | ICD-10-CM | POA: Diagnosis not present

## 2020-07-19 DIAGNOSIS — G2581 Restless legs syndrome: Secondary | ICD-10-CM | POA: Diagnosis not present

## 2020-07-19 DIAGNOSIS — I771 Stricture of artery: Secondary | ICD-10-CM | POA: Diagnosis not present

## 2020-07-19 DIAGNOSIS — Z471 Aftercare following joint replacement surgery: Secondary | ICD-10-CM | POA: Diagnosis not present

## 2020-07-19 DIAGNOSIS — Z7984 Long term (current) use of oral hypoglycemic drugs: Secondary | ICD-10-CM | POA: Diagnosis not present

## 2020-07-19 DIAGNOSIS — E785 Hyperlipidemia, unspecified: Secondary | ICD-10-CM | POA: Diagnosis not present

## 2020-07-19 DIAGNOSIS — Z79899 Other long term (current) drug therapy: Secondary | ICD-10-CM | POA: Diagnosis not present

## 2020-07-19 DIAGNOSIS — Z9181 History of falling: Secondary | ICD-10-CM | POA: Diagnosis not present

## 2020-07-19 DIAGNOSIS — Z87891 Personal history of nicotine dependence: Secondary | ICD-10-CM | POA: Diagnosis not present

## 2020-07-19 DIAGNOSIS — I69820 Aphasia following other cerebrovascular disease: Secondary | ICD-10-CM | POA: Diagnosis not present

## 2020-07-19 DIAGNOSIS — G20 Parkinson's disease: Secondary | ICD-10-CM | POA: Diagnosis not present

## 2020-07-19 LAB — CUP PACEART REMOTE DEVICE CHECK
Date Time Interrogation Session: 20220419215527
Implantable Pulse Generator Implant Date: 20210830

## 2020-07-24 DIAGNOSIS — I6521 Occlusion and stenosis of right carotid artery: Secondary | ICD-10-CM | POA: Diagnosis not present

## 2020-07-24 DIAGNOSIS — Z471 Aftercare following joint replacement surgery: Secondary | ICD-10-CM | POA: Diagnosis not present

## 2020-07-24 DIAGNOSIS — R498 Other voice and resonance disorders: Secondary | ICD-10-CM | POA: Diagnosis not present

## 2020-07-24 DIAGNOSIS — Z7984 Long term (current) use of oral hypoglycemic drugs: Secondary | ICD-10-CM | POA: Diagnosis not present

## 2020-07-24 DIAGNOSIS — I771 Stricture of artery: Secondary | ICD-10-CM | POA: Diagnosis not present

## 2020-07-24 DIAGNOSIS — E785 Hyperlipidemia, unspecified: Secondary | ICD-10-CM | POA: Diagnosis not present

## 2020-07-24 DIAGNOSIS — G20 Parkinson's disease: Secondary | ICD-10-CM | POA: Diagnosis not present

## 2020-07-24 DIAGNOSIS — E1151 Type 2 diabetes mellitus with diabetic peripheral angiopathy without gangrene: Secondary | ICD-10-CM | POA: Diagnosis not present

## 2020-07-24 DIAGNOSIS — Z79899 Other long term (current) drug therapy: Secondary | ICD-10-CM | POA: Diagnosis not present

## 2020-07-24 DIAGNOSIS — Z96641 Presence of right artificial hip joint: Secondary | ICD-10-CM | POA: Diagnosis not present

## 2020-07-24 DIAGNOSIS — G2581 Restless legs syndrome: Secondary | ICD-10-CM | POA: Diagnosis not present

## 2020-07-24 DIAGNOSIS — Z9181 History of falling: Secondary | ICD-10-CM | POA: Diagnosis not present

## 2020-07-24 DIAGNOSIS — Z87891 Personal history of nicotine dependence: Secondary | ICD-10-CM | POA: Diagnosis not present

## 2020-07-24 DIAGNOSIS — I69820 Aphasia following other cerebrovascular disease: Secondary | ICD-10-CM | POA: Diagnosis not present

## 2020-07-25 DIAGNOSIS — Z471 Aftercare following joint replacement surgery: Secondary | ICD-10-CM | POA: Diagnosis not present

## 2020-07-25 DIAGNOSIS — G20 Parkinson's disease: Secondary | ICD-10-CM | POA: Diagnosis not present

## 2020-07-25 DIAGNOSIS — I771 Stricture of artery: Secondary | ICD-10-CM | POA: Diagnosis not present

## 2020-07-25 DIAGNOSIS — E1151 Type 2 diabetes mellitus with diabetic peripheral angiopathy without gangrene: Secondary | ICD-10-CM | POA: Diagnosis not present

## 2020-07-25 DIAGNOSIS — Z9181 History of falling: Secondary | ICD-10-CM | POA: Diagnosis not present

## 2020-07-25 DIAGNOSIS — Z96641 Presence of right artificial hip joint: Secondary | ICD-10-CM | POA: Diagnosis not present

## 2020-07-25 DIAGNOSIS — I69820 Aphasia following other cerebrovascular disease: Secondary | ICD-10-CM | POA: Diagnosis not present

## 2020-07-25 DIAGNOSIS — G2581 Restless legs syndrome: Secondary | ICD-10-CM | POA: Diagnosis not present

## 2020-07-25 DIAGNOSIS — Z79899 Other long term (current) drug therapy: Secondary | ICD-10-CM | POA: Diagnosis not present

## 2020-07-25 DIAGNOSIS — I6521 Occlusion and stenosis of right carotid artery: Secondary | ICD-10-CM | POA: Diagnosis not present

## 2020-07-25 DIAGNOSIS — E785 Hyperlipidemia, unspecified: Secondary | ICD-10-CM | POA: Diagnosis not present

## 2020-07-25 DIAGNOSIS — Z7984 Long term (current) use of oral hypoglycemic drugs: Secondary | ICD-10-CM | POA: Diagnosis not present

## 2020-07-25 DIAGNOSIS — R498 Other voice and resonance disorders: Secondary | ICD-10-CM | POA: Diagnosis not present

## 2020-07-25 DIAGNOSIS — Z87891 Personal history of nicotine dependence: Secondary | ICD-10-CM | POA: Diagnosis not present

## 2020-07-26 DIAGNOSIS — Z471 Aftercare following joint replacement surgery: Secondary | ICD-10-CM | POA: Diagnosis not present

## 2020-07-26 DIAGNOSIS — E785 Hyperlipidemia, unspecified: Secondary | ICD-10-CM | POA: Diagnosis not present

## 2020-07-26 DIAGNOSIS — Z96641 Presence of right artificial hip joint: Secondary | ICD-10-CM | POA: Diagnosis not present

## 2020-07-26 DIAGNOSIS — Z87891 Personal history of nicotine dependence: Secondary | ICD-10-CM | POA: Diagnosis not present

## 2020-07-26 DIAGNOSIS — R498 Other voice and resonance disorders: Secondary | ICD-10-CM | POA: Diagnosis not present

## 2020-07-26 DIAGNOSIS — I771 Stricture of artery: Secondary | ICD-10-CM | POA: Diagnosis not present

## 2020-07-26 DIAGNOSIS — G2581 Restless legs syndrome: Secondary | ICD-10-CM | POA: Diagnosis not present

## 2020-07-26 DIAGNOSIS — Z9181 History of falling: Secondary | ICD-10-CM | POA: Diagnosis not present

## 2020-07-26 DIAGNOSIS — I6521 Occlusion and stenosis of right carotid artery: Secondary | ICD-10-CM | POA: Diagnosis not present

## 2020-07-26 DIAGNOSIS — E1151 Type 2 diabetes mellitus with diabetic peripheral angiopathy without gangrene: Secondary | ICD-10-CM | POA: Diagnosis not present

## 2020-07-26 DIAGNOSIS — Z79899 Other long term (current) drug therapy: Secondary | ICD-10-CM | POA: Diagnosis not present

## 2020-07-26 DIAGNOSIS — G20 Parkinson's disease: Secondary | ICD-10-CM | POA: Diagnosis not present

## 2020-07-26 DIAGNOSIS — I69820 Aphasia following other cerebrovascular disease: Secondary | ICD-10-CM | POA: Diagnosis not present

## 2020-07-26 DIAGNOSIS — Z7984 Long term (current) use of oral hypoglycemic drugs: Secondary | ICD-10-CM | POA: Diagnosis not present

## 2020-07-27 ENCOUNTER — Other Ambulatory Visit: Payer: Self-pay

## 2020-07-27 ENCOUNTER — Other Ambulatory Visit: Payer: Medicare Other | Admitting: Hospice

## 2020-07-27 DIAGNOSIS — I6521 Occlusion and stenosis of right carotid artery: Secondary | ICD-10-CM | POA: Diagnosis not present

## 2020-07-27 DIAGNOSIS — R498 Other voice and resonance disorders: Secondary | ICD-10-CM | POA: Diagnosis not present

## 2020-07-27 DIAGNOSIS — R410 Disorientation, unspecified: Secondary | ICD-10-CM | POA: Diagnosis not present

## 2020-07-27 DIAGNOSIS — G20 Parkinson's disease: Secondary | ICD-10-CM | POA: Diagnosis not present

## 2020-07-27 DIAGNOSIS — I771 Stricture of artery: Secondary | ICD-10-CM | POA: Diagnosis not present

## 2020-07-27 DIAGNOSIS — Z96641 Presence of right artificial hip joint: Secondary | ICD-10-CM | POA: Diagnosis not present

## 2020-07-27 DIAGNOSIS — E1151 Type 2 diabetes mellitus with diabetic peripheral angiopathy without gangrene: Secondary | ICD-10-CM | POA: Diagnosis not present

## 2020-07-27 DIAGNOSIS — Z515 Encounter for palliative care: Secondary | ICD-10-CM

## 2020-07-27 DIAGNOSIS — Z7984 Long term (current) use of oral hypoglycemic drugs: Secondary | ICD-10-CM | POA: Diagnosis not present

## 2020-07-27 DIAGNOSIS — Z471 Aftercare following joint replacement surgery: Secondary | ICD-10-CM | POA: Diagnosis not present

## 2020-07-27 DIAGNOSIS — Z79899 Other long term (current) drug therapy: Secondary | ICD-10-CM | POA: Diagnosis not present

## 2020-07-27 DIAGNOSIS — Z9181 History of falling: Secondary | ICD-10-CM | POA: Diagnosis not present

## 2020-07-27 DIAGNOSIS — E785 Hyperlipidemia, unspecified: Secondary | ICD-10-CM | POA: Diagnosis not present

## 2020-07-27 DIAGNOSIS — I69820 Aphasia following other cerebrovascular disease: Secondary | ICD-10-CM | POA: Diagnosis not present

## 2020-07-27 DIAGNOSIS — G2581 Restless legs syndrome: Secondary | ICD-10-CM | POA: Diagnosis not present

## 2020-07-27 DIAGNOSIS — G20A1 Parkinson's disease without dyskinesia, without mention of fluctuations: Secondary | ICD-10-CM

## 2020-07-27 DIAGNOSIS — Z87891 Personal history of nicotine dependence: Secondary | ICD-10-CM | POA: Diagnosis not present

## 2020-07-27 NOTE — Progress Notes (Signed)
[facility] Consult Note Telephone: [phone]  Fax: [phone]  PATIENT NAME: Rodney Love DOB: Sep 27, 1945 MRN: [id number]  PRIMARY CARE PROVIDER:   Lujean Amel, MD Lujean Amel, [place] [address],  [place] [address]  REFERRING PROVIDER: Lujean Amel, MD Lujean Amel, MD [place] [address],  [place] [address]  RESPONSIBLE PARTY:Self: [phone] h best number to call [place] [phone] Extended Emergency Contact Information Primary Emergency Contact: Dallas Breeding For emails Home Phone: [phone] Mobile Phone: [phone] Relation: Daughter Secondary Emergency Contact: Ellender Hose Address: [place], [place] Phone: [phone] Mobile Phone: [phone] Relation: Spouse- HCPOA [name] - daughter [phone] the oldest daughter lives in [place]    Name Relation Home Work Mobile   Aristes Spouse [phone]  [phone]   Dallas Breeding Daughter [phone]  [phone]   [name] Daughter [phone]      Visit is to build trust and highlight Palliative Medicine as specialized medical care for people living with serious illness, aimed at facilitating better quality of life through symptoms relief, assisting with advance care planning and complex medical decision making.  [name] and [name] are present with patient during visit.  RECOMMENDATIONS/PLAN:   Advance Care Planning/Code Status: Extensive discussion today on CODE STATUS, explaining the implications and ramifications.  [name] said patient remains a full code at this time.  She she will rethink it change CODE STATUS as appropriate in the future.  Goals of Care: Goals of care include to maximize quality of life and symptom management.  Visit consisted of counseling and  education dealing with the complex and emotionally intense issues of symptom management and palliative care in the setting of serious and potentially life-threatening illness. Palliative care team will continue to support patient, patient's family, and medical team.  I spent 20  minutes providing this consultation. More than 50% of the time in this consultation was spent on coordinating communication.  -------------------------------------------------------------------------------------------------------------------------------------------------- Symptom management/Plan:  Confusion: Acute confusion significantly improved; patient's confusion now at baseline. Fatigue: Patient sleeping more than before  Up to 16 hours of the day up from 12 hours a month ago. PT/OT is ongoing. He is ambulatory with supervision/support.  Encourage optimization of mobility/activity for strengthening. Appetite is good. Ensure adequate oral intake. Low back pain: Heating pad. Tylenol 500mg  TID prn pain. Parkinson disease: Appointment with neurologist 08/22/2020.  Continue with Sinemet as ordered.  Education provided on Parkinson disease trajectory and worsening symptoms/decline that could be expected.  Follow up: Palliative care will continue to follow for complex medical decision making, advance care planning, and clarification of goals. Return 6 weeks or prn. Encouraged to call provider sooner with any concerns.  CHIEF COMPLAINT: Palliative follow up visit/Confusion  HISTORY OF PRESENT ILLNESS:  Rodney Love a 75 y.o. male with multiple medical problems including confusion which has improved, back to baseline.  Last month patient's confusion was suspicious for UTI but UA was negative.  Confusion impaired his independence, chronic, intermittent, worsened during the evening time, but over time has resolved and now back to his baseline confusion.  Patient able to answer simple questions coherently, denies pain/discomfort.   New complaint of fatigue and sleeping more in the last month with associated decline in functional status/quality of life, worse during the day.  Physical therapy seems to help with his fatigue.  History of Parkinson's disease, hypertension, CAD-patient  has a loop recorder,Syncope.  History obtained from review of EMR, discussion with primary team, family, caregiver  and/or patient. Records reviewed and summarized above. All 10 point systems reviewed and are negative except as documented in history of present illness above  Review and summarization of Epic records shows history from other than patient.   Palliative Care was asked to follow this patient by consultation request of Koirala, Dibas, MD to help address complex decision making in the context of advance care planning and goals of care clarification.   CODE STATUS: Full  PPS: 40%  HOSPICE ELIGIBILITY/DIAGNOSIS: TBD  PAST MEDICAL HISTORY:  Past Medical History:  Diagnosis Date  . Arthritis   . Constipation    OCCASIONAL  . Coronary artery disease   . Diabetes mellitus without complication ([facility])   . Environmental allergies   . Hyperlipidemia   . Hypertension   . Inguinal hernia   . Iron deficiency   . Nocturia   . Parkinson disease ([facility])   . Peripheral arterial disease (H[facility])      SOCIAL HX: @SOCX  Patient lives at home for ongoing care  FAMILY HX:  Family History  Problem Relation Age of Onset  . Diabetes Mother   . Kidney disease Mother   . Stroke Father   . Stroke Brother   . Cancer Sister   . Cancer Sister   . Diabetes Sister   . Healthy Daughter   . Healthy Daughter   . Healthy Son     Review lab tests/diagnostics Results for ROEMELLO, SPEYER (MRN [id number]) as of 07/27/2020 16:11  Ref. Range 05/01/2020 05:59  Sodium Latest Ref Range: 135 - 145 mmol/L 139  Potassium Latest Ref Range: 3.5 - 5.1 mmol/L 3.7  Chloride Latest Ref Range: 98 - 111 mmol/L 110  CO2 Latest Ref Range: 22 - 32 mmol/L 23  Glucose  Latest Ref Range: 70 - 99 mg/dL 165 (H)  Mean Plasma Glucose Latest Units: mg/dL 111.15  BUN Latest Ref Range: 8 - 23 mg/dL 15  Creatinine Latest Ref Range: 0.61 - 1.24 mg/dL 0.71  Calcium Latest Ref Range: 8.9 - 10.3 mg/dL 7.8 (L)  Anion gap Latest Ref Range: 5 - 15  6  Results for CORDARRO, SPINNATO (MRN [id number]) as of 07/27/2020 16:11  Ref. Range 05/02/2020 05:18  WBC Latest Ref Range: 4.0 - 10.5 K/uL 6.2  RBC Latest Ref Range: 4.22 - 5.81 MIL/uL 3.17 (L)  Hemoglobin Latest Ref Range: 13.0 - 17.0 g/dL 10.0 (L)  HCT Latest Ref Range: 39.0 - 52.0 % 31.0 (L)  MCV Latest Ref Range: 80.0 - 100.0 fL 97.8  MCH Latest Ref Range: 26.0 - 34.0 pg 31.5  MCHC Latest Ref Range: 30.0 - 36.0 g/dL 32.3  RDW Latest Ref Range: 11.5 - 15.5 % 12.8  Platelets Latest Ref Range: 150 - 400 K/uL 143 (L)  nRBC Latest Ref Range: 0.0 - 0.2 % 0.0    ALLERGIES: No Known Allergies    PERTINENT MEDICATIONS:  Outpatient Encounter Medications as of 07/27/2020  Medication Sig  . acetaminophen (TYLENOL) 500 MG tablet Take 500 mg by mouth every 6 (six) hours as needed for moderate pain.  Marland Kitchen atorvastatin (LIPITOR) 20 MG tablet Take 20 mg by mouth daily.   . carbidopa-levodopa (SINEMET CR) 50-200 MG tablet TAKE 1 TABLET BY MOUTH AT BEDTIME  . Carbidopa-Levodopa ER (SINEMET CR) 25-100 MG tablet controlled release TAKE 1 TABLET BY MOUTH IN THE MORNING, AT NOON, IN THE EVENING, AND AT BEDTIME.(6AM/10AM/2PM/6PM)  .  cholecalciferol (VITAMIN D3) 25 MCG (1000 UNIT) tablet Take 1,000 Units by mouth daily.  . Cyanocobalamin (VITAMIN B 12 PO) Take 1 tablet by mouth daily.  . ferrous sulfate 324 MG TBEC Take 324 mg by mouth daily at 6 (six) AM.  . metFORMIN (GLUCOPHAGE) 850 MG tablet Take 850 mg by mouth 3 (three) times a week. Mon/Wed/Fri  . ONETOUCH ULTRA test strip   . QUEtiapine (SEROQUEL) 25 MG tablet 1/2 in the AM and 1 at bed.  May add another 1/2 tablet in the late afternoon if need (Patient taking differently: Take 12.5 mg by  mouth at bedtime.)  . senna (SENOKOT) 8.6 MG TABS tablet Take 1 tablet (8.6 mg total) by mouth 2 (two) times daily.  . [DISCONTINUED] pramipexole (MIRAPEX) 0.125 MG tablet Take 0.125 mg by mouth at bedtime.   Facility-Administered Encounter Medications as of 07/27/2020  Medication  . lidocaine-EPINEPHrine (XYLOCAINE W/EPI) 1 %-1:100000 (with pres) injection 10 mL      ROS  General: NAD, appropriately dressed Constitution: Denies fever/chills EYES: denies vision changes ENMT: denies Xerostomia,  dysphagia Cardiovascular: denies chest pain Pulmonary: denies  cough, denies dyspnea  Abdomen: endorses fair appetite, denies constipation or diarrhea GU: denies dysuria MSK:  endorses ROM limitations, no falls reported Skin: denies rashes/bruising Neurological: endorses weakness, denies pain, denies insomnia Psych: Endorses positive mood Heme/lymph/immuno: denies bruises, no abnormal bleeding   PHYSICAL EXAM  BP 122/60 P 58 R 18 02 95% RA General: In no acute distress, appropriately dressed Cardiovascular: regular rate and rhythm; no edema in BLE Pulmonary: no cough, no increased work of breathing, normal respiratory effort Abdomen: soft, non tender, no guarding, positive bowel sounds in all quadrants GU:  no suprapubic tenderness Eyes: Normal lids, no discharge, sclera anicteric ENMT: Moist mucous membranes Musculoskeletal: Fatigue, moderate sarcopenia Skin: no rash to visible skin, warm without cyanosis,  Psych: non-anxious affect Neurological: Weakness but otherwise non focal, memory loss and confusion at baseline Heme/lymph/immuno: no bruises, no bleeding  Thank you for the opportunity to participate in the care of ANTHONIE LOTITO Please call our office at [phone] if we can be of additional assistance.  Note: Portions of this note were generated with Lobbyist. Dictation errors may occur despite best attempts at proofreading.  Teodoro Spray, NP

## 2020-07-31 DIAGNOSIS — G2581 Restless legs syndrome: Secondary | ICD-10-CM | POA: Diagnosis not present

## 2020-07-31 DIAGNOSIS — E785 Hyperlipidemia, unspecified: Secondary | ICD-10-CM | POA: Diagnosis not present

## 2020-07-31 DIAGNOSIS — Z7984 Long term (current) use of oral hypoglycemic drugs: Secondary | ICD-10-CM | POA: Diagnosis not present

## 2020-07-31 DIAGNOSIS — Z79899 Other long term (current) drug therapy: Secondary | ICD-10-CM | POA: Diagnosis not present

## 2020-07-31 DIAGNOSIS — R498 Other voice and resonance disorders: Secondary | ICD-10-CM | POA: Diagnosis not present

## 2020-07-31 DIAGNOSIS — I771 Stricture of artery: Secondary | ICD-10-CM | POA: Diagnosis not present

## 2020-07-31 DIAGNOSIS — Z96641 Presence of right artificial hip joint: Secondary | ICD-10-CM | POA: Diagnosis not present

## 2020-07-31 DIAGNOSIS — G20 Parkinson's disease: Secondary | ICD-10-CM | POA: Diagnosis not present

## 2020-07-31 DIAGNOSIS — Z9181 History of falling: Secondary | ICD-10-CM | POA: Diagnosis not present

## 2020-07-31 DIAGNOSIS — E1151 Type 2 diabetes mellitus with diabetic peripheral angiopathy without gangrene: Secondary | ICD-10-CM | POA: Diagnosis not present

## 2020-07-31 DIAGNOSIS — I6521 Occlusion and stenosis of right carotid artery: Secondary | ICD-10-CM | POA: Diagnosis not present

## 2020-07-31 DIAGNOSIS — Z471 Aftercare following joint replacement surgery: Secondary | ICD-10-CM | POA: Diagnosis not present

## 2020-07-31 DIAGNOSIS — Z87891 Personal history of nicotine dependence: Secondary | ICD-10-CM | POA: Diagnosis not present

## 2020-07-31 DIAGNOSIS — I69820 Aphasia following other cerebrovascular disease: Secondary | ICD-10-CM | POA: Diagnosis not present

## 2020-08-01 ENCOUNTER — Ambulatory Visit (HOSPITAL_COMMUNITY)
Admission: RE | Admit: 2020-08-01 | Payer: Medicare Other | Source: Ambulatory Visit | Attending: Cardiovascular Disease | Admitting: Cardiovascular Disease

## 2020-08-01 DIAGNOSIS — E785 Hyperlipidemia, unspecified: Secondary | ICD-10-CM | POA: Diagnosis not present

## 2020-08-01 DIAGNOSIS — Z79899 Other long term (current) drug therapy: Secondary | ICD-10-CM | POA: Diagnosis not present

## 2020-08-01 DIAGNOSIS — Z96641 Presence of right artificial hip joint: Secondary | ICD-10-CM | POA: Diagnosis not present

## 2020-08-01 DIAGNOSIS — Z471 Aftercare following joint replacement surgery: Secondary | ICD-10-CM | POA: Diagnosis not present

## 2020-08-01 DIAGNOSIS — Z87891 Personal history of nicotine dependence: Secondary | ICD-10-CM | POA: Diagnosis not present

## 2020-08-01 DIAGNOSIS — Z9181 History of falling: Secondary | ICD-10-CM | POA: Diagnosis not present

## 2020-08-01 DIAGNOSIS — I69820 Aphasia following other cerebrovascular disease: Secondary | ICD-10-CM | POA: Diagnosis not present

## 2020-08-01 DIAGNOSIS — E1151 Type 2 diabetes mellitus with diabetic peripheral angiopathy without gangrene: Secondary | ICD-10-CM | POA: Diagnosis not present

## 2020-08-01 DIAGNOSIS — I771 Stricture of artery: Secondary | ICD-10-CM | POA: Diagnosis not present

## 2020-08-01 DIAGNOSIS — Z7984 Long term (current) use of oral hypoglycemic drugs: Secondary | ICD-10-CM | POA: Diagnosis not present

## 2020-08-01 DIAGNOSIS — G2581 Restless legs syndrome: Secondary | ICD-10-CM | POA: Diagnosis not present

## 2020-08-01 DIAGNOSIS — G20 Parkinson's disease: Secondary | ICD-10-CM | POA: Diagnosis not present

## 2020-08-01 DIAGNOSIS — R498 Other voice and resonance disorders: Secondary | ICD-10-CM | POA: Diagnosis not present

## 2020-08-01 DIAGNOSIS — I6521 Occlusion and stenosis of right carotid artery: Secondary | ICD-10-CM | POA: Diagnosis not present

## 2020-08-02 DIAGNOSIS — S7291XA Unspecified fracture of right femur, initial encounter for closed fracture: Secondary | ICD-10-CM | POA: Diagnosis not present

## 2020-08-02 DIAGNOSIS — S72001S Fracture of unspecified part of neck of right femur, sequela: Secondary | ICD-10-CM | POA: Diagnosis not present

## 2020-08-02 DIAGNOSIS — I739 Peripheral vascular disease, unspecified: Secondary | ICD-10-CM | POA: Diagnosis not present

## 2020-08-02 DIAGNOSIS — G20 Parkinson's disease: Secondary | ICD-10-CM | POA: Diagnosis not present

## 2020-08-03 NOTE — Progress Notes (Signed)
Carelink Summary Report / Loop Recorder 

## 2020-08-04 ENCOUNTER — Ambulatory Visit (HOSPITAL_COMMUNITY)
Admission: RE | Admit: 2020-08-04 | Discharge: 2020-08-04 | Disposition: A | Discharge status: Home / Self Care | Payer: Medicare Other | Source: Ambulatory Visit | Attending: Cardiology | Admitting: Cardiology

## 2020-08-04 ENCOUNTER — Ambulatory Visit (HOSPITAL_BASED_OUTPATIENT_CLINIC_OR_DEPARTMENT_OTHER)
Admission: RE | Admit: 2020-08-04 | Discharge: 2020-08-04 | Disposition: A | Discharge status: Home / Self Care | Payer: Medicare Other | Source: Ambulatory Visit | Attending: Cardiology | Admitting: Cardiology

## 2020-08-04 ENCOUNTER — Other Ambulatory Visit: Payer: Self-pay

## 2020-08-04 DIAGNOSIS — I739 Peripheral vascular disease, unspecified: Secondary | ICD-10-CM

## 2020-08-04 DIAGNOSIS — Z95820 Peripheral vascular angioplasty status with implants and grafts: Secondary | ICD-10-CM

## 2020-08-07 DIAGNOSIS — G20 Parkinson's disease: Secondary | ICD-10-CM | POA: Diagnosis not present

## 2020-08-07 DIAGNOSIS — Z79899 Other long term (current) drug therapy: Secondary | ICD-10-CM | POA: Diagnosis not present

## 2020-08-07 DIAGNOSIS — G2581 Restless legs syndrome: Secondary | ICD-10-CM | POA: Diagnosis not present

## 2020-08-07 DIAGNOSIS — Z471 Aftercare following joint replacement surgery: Secondary | ICD-10-CM | POA: Diagnosis not present

## 2020-08-07 DIAGNOSIS — Z9181 History of falling: Secondary | ICD-10-CM | POA: Diagnosis not present

## 2020-08-07 DIAGNOSIS — Z96641 Presence of right artificial hip joint: Secondary | ICD-10-CM | POA: Diagnosis not present

## 2020-08-07 DIAGNOSIS — I771 Stricture of artery: Secondary | ICD-10-CM | POA: Diagnosis not present

## 2020-08-07 DIAGNOSIS — R498 Other voice and resonance disorders: Secondary | ICD-10-CM | POA: Diagnosis not present

## 2020-08-07 DIAGNOSIS — E1151 Type 2 diabetes mellitus with diabetic peripheral angiopathy without gangrene: Secondary | ICD-10-CM | POA: Diagnosis not present

## 2020-08-07 DIAGNOSIS — Z7984 Long term (current) use of oral hypoglycemic drugs: Secondary | ICD-10-CM | POA: Diagnosis not present

## 2020-08-07 DIAGNOSIS — Z87891 Personal history of nicotine dependence: Secondary | ICD-10-CM | POA: Diagnosis not present

## 2020-08-07 DIAGNOSIS — I6521 Occlusion and stenosis of right carotid artery: Secondary | ICD-10-CM | POA: Diagnosis not present

## 2020-08-07 DIAGNOSIS — E785 Hyperlipidemia, unspecified: Secondary | ICD-10-CM | POA: Diagnosis not present

## 2020-08-07 DIAGNOSIS — I69820 Aphasia following other cerebrovascular disease: Secondary | ICD-10-CM | POA: Diagnosis not present

## 2020-08-08 DIAGNOSIS — G20 Parkinson's disease: Secondary | ICD-10-CM | POA: Diagnosis not present

## 2020-08-08 DIAGNOSIS — Z9181 History of falling: Secondary | ICD-10-CM | POA: Diagnosis not present

## 2020-08-08 DIAGNOSIS — Z96641 Presence of right artificial hip joint: Secondary | ICD-10-CM | POA: Diagnosis not present

## 2020-08-08 DIAGNOSIS — Z471 Aftercare following joint replacement surgery: Secondary | ICD-10-CM | POA: Diagnosis not present

## 2020-08-08 DIAGNOSIS — E1151 Type 2 diabetes mellitus with diabetic peripheral angiopathy without gangrene: Secondary | ICD-10-CM | POA: Diagnosis not present

## 2020-08-08 DIAGNOSIS — R498 Other voice and resonance disorders: Secondary | ICD-10-CM | POA: Diagnosis not present

## 2020-08-08 DIAGNOSIS — Z7984 Long term (current) use of oral hypoglycemic drugs: Secondary | ICD-10-CM | POA: Diagnosis not present

## 2020-08-08 DIAGNOSIS — I771 Stricture of artery: Secondary | ICD-10-CM | POA: Diagnosis not present

## 2020-08-08 DIAGNOSIS — I69820 Aphasia following other cerebrovascular disease: Secondary | ICD-10-CM | POA: Diagnosis not present

## 2020-08-08 DIAGNOSIS — Z79899 Other long term (current) drug therapy: Secondary | ICD-10-CM | POA: Diagnosis not present

## 2020-08-08 DIAGNOSIS — I6521 Occlusion and stenosis of right carotid artery: Secondary | ICD-10-CM | POA: Diagnosis not present

## 2020-08-08 DIAGNOSIS — G2581 Restless legs syndrome: Secondary | ICD-10-CM | POA: Diagnosis not present

## 2020-08-08 DIAGNOSIS — E785 Hyperlipidemia, unspecified: Secondary | ICD-10-CM | POA: Diagnosis not present

## 2020-08-08 DIAGNOSIS — Z87891 Personal history of nicotine dependence: Secondary | ICD-10-CM | POA: Diagnosis not present

## 2020-08-11 DIAGNOSIS — Z9181 History of falling: Secondary | ICD-10-CM | POA: Diagnosis not present

## 2020-08-11 DIAGNOSIS — Z7984 Long term (current) use of oral hypoglycemic drugs: Secondary | ICD-10-CM | POA: Diagnosis not present

## 2020-08-11 DIAGNOSIS — Z96641 Presence of right artificial hip joint: Secondary | ICD-10-CM | POA: Diagnosis not present

## 2020-08-11 DIAGNOSIS — G20 Parkinson's disease: Secondary | ICD-10-CM | POA: Diagnosis not present

## 2020-08-11 DIAGNOSIS — I6521 Occlusion and stenosis of right carotid artery: Secondary | ICD-10-CM | POA: Diagnosis not present

## 2020-08-11 DIAGNOSIS — Z79899 Other long term (current) drug therapy: Secondary | ICD-10-CM | POA: Diagnosis not present

## 2020-08-11 DIAGNOSIS — Z471 Aftercare following joint replacement surgery: Secondary | ICD-10-CM | POA: Diagnosis not present

## 2020-08-11 DIAGNOSIS — E1151 Type 2 diabetes mellitus with diabetic peripheral angiopathy without gangrene: Secondary | ICD-10-CM | POA: Diagnosis not present

## 2020-08-11 DIAGNOSIS — I69820 Aphasia following other cerebrovascular disease: Secondary | ICD-10-CM | POA: Diagnosis not present

## 2020-08-11 DIAGNOSIS — I771 Stricture of artery: Secondary | ICD-10-CM | POA: Diagnosis not present

## 2020-08-11 DIAGNOSIS — Z87891 Personal history of nicotine dependence: Secondary | ICD-10-CM | POA: Diagnosis not present

## 2020-08-11 DIAGNOSIS — E785 Hyperlipidemia, unspecified: Secondary | ICD-10-CM | POA: Diagnosis not present

## 2020-08-11 DIAGNOSIS — G2581 Restless legs syndrome: Secondary | ICD-10-CM | POA: Diagnosis not present

## 2020-08-11 DIAGNOSIS — R498 Other voice and resonance disorders: Secondary | ICD-10-CM | POA: Diagnosis not present

## 2020-08-14 DIAGNOSIS — Z9181 History of falling: Secondary | ICD-10-CM | POA: Diagnosis not present

## 2020-08-14 DIAGNOSIS — I6521 Occlusion and stenosis of right carotid artery: Secondary | ICD-10-CM | POA: Diagnosis not present

## 2020-08-14 DIAGNOSIS — G2581 Restless legs syndrome: Secondary | ICD-10-CM | POA: Diagnosis not present

## 2020-08-14 DIAGNOSIS — Z471 Aftercare following joint replacement surgery: Secondary | ICD-10-CM | POA: Diagnosis not present

## 2020-08-14 DIAGNOSIS — E785 Hyperlipidemia, unspecified: Secondary | ICD-10-CM | POA: Diagnosis not present

## 2020-08-14 DIAGNOSIS — Z79899 Other long term (current) drug therapy: Secondary | ICD-10-CM | POA: Diagnosis not present

## 2020-08-14 DIAGNOSIS — I771 Stricture of artery: Secondary | ICD-10-CM | POA: Diagnosis not present

## 2020-08-14 DIAGNOSIS — Z87891 Personal history of nicotine dependence: Secondary | ICD-10-CM | POA: Diagnosis not present

## 2020-08-14 DIAGNOSIS — R498 Other voice and resonance disorders: Secondary | ICD-10-CM | POA: Diagnosis not present

## 2020-08-14 DIAGNOSIS — Z7984 Long term (current) use of oral hypoglycemic drugs: Secondary | ICD-10-CM | POA: Diagnosis not present

## 2020-08-14 DIAGNOSIS — E1151 Type 2 diabetes mellitus with diabetic peripheral angiopathy without gangrene: Secondary | ICD-10-CM | POA: Diagnosis not present

## 2020-08-14 DIAGNOSIS — G20 Parkinson's disease: Secondary | ICD-10-CM | POA: Diagnosis not present

## 2020-08-14 DIAGNOSIS — I69820 Aphasia following other cerebrovascular disease: Secondary | ICD-10-CM | POA: Diagnosis not present

## 2020-08-14 DIAGNOSIS — Z96641 Presence of right artificial hip joint: Secondary | ICD-10-CM | POA: Diagnosis not present

## 2020-08-15 DIAGNOSIS — Z96641 Presence of right artificial hip joint: Secondary | ICD-10-CM | POA: Diagnosis not present

## 2020-08-15 DIAGNOSIS — Z7984 Long term (current) use of oral hypoglycemic drugs: Secondary | ICD-10-CM | POA: Diagnosis not present

## 2020-08-15 DIAGNOSIS — I771 Stricture of artery: Secondary | ICD-10-CM | POA: Diagnosis not present

## 2020-08-15 DIAGNOSIS — Z471 Aftercare following joint replacement surgery: Secondary | ICD-10-CM | POA: Diagnosis not present

## 2020-08-15 DIAGNOSIS — G20 Parkinson's disease: Secondary | ICD-10-CM | POA: Diagnosis not present

## 2020-08-15 DIAGNOSIS — E1151 Type 2 diabetes mellitus with diabetic peripheral angiopathy without gangrene: Secondary | ICD-10-CM | POA: Diagnosis not present

## 2020-08-15 DIAGNOSIS — Z87891 Personal history of nicotine dependence: Secondary | ICD-10-CM | POA: Diagnosis not present

## 2020-08-15 DIAGNOSIS — G2581 Restless legs syndrome: Secondary | ICD-10-CM | POA: Diagnosis not present

## 2020-08-15 DIAGNOSIS — E785 Hyperlipidemia, unspecified: Secondary | ICD-10-CM | POA: Diagnosis not present

## 2020-08-15 DIAGNOSIS — Z9181 History of falling: Secondary | ICD-10-CM | POA: Diagnosis not present

## 2020-08-15 DIAGNOSIS — I69820 Aphasia following other cerebrovascular disease: Secondary | ICD-10-CM | POA: Diagnosis not present

## 2020-08-15 DIAGNOSIS — I6521 Occlusion and stenosis of right carotid artery: Secondary | ICD-10-CM | POA: Diagnosis not present

## 2020-08-15 DIAGNOSIS — R498 Other voice and resonance disorders: Secondary | ICD-10-CM | POA: Diagnosis not present

## 2020-08-15 DIAGNOSIS — Z79899 Other long term (current) drug therapy: Secondary | ICD-10-CM | POA: Diagnosis not present

## 2020-08-17 ENCOUNTER — Other Ambulatory Visit: Payer: Self-pay

## 2020-08-17 ENCOUNTER — Non-Acute Institutional Stay: Payer: Medicare Other | Admitting: Hospice

## 2020-08-17 DIAGNOSIS — Z79899 Other long term (current) drug therapy: Secondary | ICD-10-CM | POA: Diagnosis not present

## 2020-08-17 DIAGNOSIS — Z515 Encounter for palliative care: Secondary | ICD-10-CM | POA: Diagnosis not present

## 2020-08-17 DIAGNOSIS — F0280 Dementia in other diseases classified elsewhere without behavioral disturbance: Secondary | ICD-10-CM

## 2020-08-17 DIAGNOSIS — G20 Parkinson's disease: Secondary | ICD-10-CM

## 2020-08-17 DIAGNOSIS — G2581 Restless legs syndrome: Secondary | ICD-10-CM | POA: Diagnosis not present

## 2020-08-17 DIAGNOSIS — I6521 Occlusion and stenosis of right carotid artery: Secondary | ICD-10-CM | POA: Diagnosis not present

## 2020-08-17 DIAGNOSIS — E785 Hyperlipidemia, unspecified: Secondary | ICD-10-CM | POA: Diagnosis not present

## 2020-08-17 DIAGNOSIS — I69820 Aphasia following other cerebrovascular disease: Secondary | ICD-10-CM | POA: Diagnosis not present

## 2020-08-17 DIAGNOSIS — I771 Stricture of artery: Secondary | ICD-10-CM | POA: Diagnosis not present

## 2020-08-17 DIAGNOSIS — Z471 Aftercare following joint replacement surgery: Secondary | ICD-10-CM | POA: Diagnosis not present

## 2020-08-17 DIAGNOSIS — Z96641 Presence of right artificial hip joint: Secondary | ICD-10-CM | POA: Diagnosis not present

## 2020-08-17 DIAGNOSIS — Z9181 History of falling: Secondary | ICD-10-CM | POA: Diagnosis not present

## 2020-08-17 DIAGNOSIS — R498 Other voice and resonance disorders: Secondary | ICD-10-CM | POA: Diagnosis not present

## 2020-08-17 DIAGNOSIS — E1151 Type 2 diabetes mellitus with diabetic peripheral angiopathy without gangrene: Secondary | ICD-10-CM | POA: Diagnosis not present

## 2020-08-17 DIAGNOSIS — Z87891 Personal history of nicotine dependence: Secondary | ICD-10-CM | POA: Diagnosis not present

## 2020-08-17 DIAGNOSIS — Z7984 Long term (current) use of oral hypoglycemic drugs: Secondary | ICD-10-CM | POA: Diagnosis not present

## 2020-08-17 NOTE — Progress Notes (Signed)
[facility] Consult Note Telephone: [phone]  Fax: [phone]  PATIENT NAME: Rodney Love DOB: [birth year] MRN: [id number]  PRIMARY CARE PROVIDER:   Lujean Amel, MD Lujean Amel, [place] [address],  [place] [address]  REFERRING PROVIDER: Lujean Amel, MD Lujean Amel, MD [place] [address],  [place] [address]  RESPONSIBLE PARTY:Spouse [phone] h best number to call [place] [phone] Extended Emergency Contact Information Primary Emergency Contact: Dallas Breeding For emails Home Phone: [phone] Mobile Phone: [phone] Relation: Daughter Secondary Emergency Contact: Ellender Hose Address: [place], [place] of [place] Phone: [phone] Mobile Phone: [phone] Relation: Spouse- HCPOA [name] - daughter [phone] the oldest daughter lives in [place] Due to the COVID-19 crisis, this visit was done via telemedicine and it was initiated and consent by this patient and or family.   Visit requested by patient/family due to ongoing decline in functional status. [name] and [name] are present during visit.  RECOMMENDATIONS/PLAN:   Advance Care Planning/Code Status: Extensive discussion today on CODE STATUS, explaining the implications and ramifications.    Mildred elected DO NOT RESUSCITATE and requested NP to sign the DNR form and send to her house by mail.  Signed DNR form uploaded to epic today.   Goals of Care: Goals of care include to maximize quality of life and symptom management.  Family is tending towards de-escalation of care, tending towards comfort care and hospice service; decision to be made after visit with neurologist next week.  Visit consisted of counseling and education dealing with the complex and emotionally intense issues of symptom  management and palliative care in the setting of serious and potentially life-threatening illness. Palliative care team will continue to support patient, patient's family, and medical team.  Symptom management/Plan:  Patient continues in functional decline, sleeping more, more disorientation, withdrawing and not wanting to be disturbed, declining appetite. Patient sleeping more. Up to 18 hours of the day up from 12 hours 2 months ago. PT/OT is ongoing but patient is not progressing.  Patient now requires to be fed sometimes and eats only 25% down from 100% 3 months ago.  He is incontinent of bowel and bladder.  Education provided to patient on disease trajectory of dementia related to Parkinson's disease. Low back pain: Heating pad. Tylenol 500mg  TID prn pain.  FLACC 0 Parkinson disease: Appointment with neurologist 08/22/2020.  Continue with Sinemet as ordered. Follow up: Palliative care will continue to follow for complex medical decision making, advance care planning, and clarification of goals. Return 6 weeks or prn. Encouraged to call provider sooner with any concerns.  CHIEF COMPLAINT: Palliative follow up visit/Dementia  HISTORY OF PRESENT ILLNESS:  [name] a 75 y.o. male with multiple medical problems including dementia and related to Parkinson's disease, Parkinson disease, hypertension, CAD-patient has a loop recorder,Syncope. Patient with worsening dementia with associated worsening confusion, occasional hallucination, decreasing appetite, decreasing mobility.  Patient lethargic during visit. History obtained from review of EMR, discussion with primary team, family, caregiver and/or patient. Records reviewed and summarized above.  Review of system from patient and family at bedside; all 10 point systems reviewed.  Review and summarization of Epic records shows history from other than patient.   Palliative Care was asked to follow this patient by consultation request of Love,  Dibas, MD to help address complex decision making in the context  of advance care planning and goals of care clarification  PPS: weak 40%  ROS  General:lethargic, sleeping most of the day Constitution: Denies fever/chills EYES: denies vision changes ENMT: denies Xerostomia,  dysphagia Cardiovascular: denies chest pain Pulmonary: denies  cough, denies dyspnea  Abdomen: endorses poor appetite, denies constipation or diarrhea GU: denies dysuria MSK:  endorses ROM limitations, no falls reported Skin: denies rashes/bruising Neurological: endorses weakness Psych: Coping Heme/lymph/immuno: denies bruises, no abnormal bleeding  PERTINENT MEDICATIONS:  Outpatient Encounter Medications as of 08/17/2020  Medication Sig  . acetaminophen (TYLENOL) 500 MG tablet Take 500 mg by mouth every 6 (six) hours as needed for moderate pain.  Marland Kitchen atorvastatin (LIPITOR) 20 MG tablet Take 20 mg by mouth daily.   . carbidopa-levodopa (SINEMET CR) 50-200 MG tablet TAKE 1 TABLET BY MOUTH AT BEDTIME  . Carbidopa-Levodopa ER (SINEMET CR) 25-100 MG tablet controlled release TAKE 1 TABLET BY MOUTH IN THE MORNING, AT NOON, IN THE EVENING, AND AT BEDTIME.(6AM/10AM/2PM/6PM)  . cholecalciferol (VITAMIN D3) 25 MCG (1000 UNIT) tablet Take 1,000 Units by mouth daily.  . Cyanocobalamin (VITAMIN B 12 PO) Take 1 tablet by mouth daily.  . ferrous sulfate 324 MG TBEC Take 324 mg by mouth daily at 6 (six) AM.  . metFORMIN (GLUCOPHAGE) 850 MG tablet Take 850 mg by mouth 3 (three) times a week. Mon/Wed/Fri  . ONETOUCH ULTRA test strip   . QUEtiapine (SEROQUEL) 25 MG tablet 1/2 in the AM and 1 at bed.  May add another 1/2 tablet in the late afternoon if need (Patient taking differently: Take 12.5 mg by mouth at bedtime.)  . senna (SENOKOT) 8.6 MG TABS tablet Take 1 tablet (8.6 mg total) by mouth 2 (two) times daily.  . [DISCONTINUED] pramipexole (MIRAPEX) 0.125 MG tablet Take 0.125 mg by mouth at bedtime.   Facility-Administered  Encounter Medications as of 08/17/2020  Medication  . lidocaine-EPINEPHrine (XYLOCAINE W/EPI) 1 %-1:100000 (with pres) injection 10 mL    HOSPICE ELIGIBILITY/DIAGNOSIS: TBD  PAST MEDICAL HISTORY:  Past Medical History:  Diagnosis Date  . Arthritis   . Constipation    OCCASIONAL  . Coronary artery disease   . Diabetes mellitus without complication ([facility])   . Environmental allergies   . Hyperlipidemia   . Hypertension   . Inguinal hernia   . Iron deficiency   . Nocturia   . Parkinson disease ([place] [facility] [place])   . Peripheral arterial disease ([facility])      SOCIAL HX: @SOCX  Patient lives at home for ongoing care  FAMILY HX:  Family History  Problem Relation Age of Onset  . Diabetes Mother   . Kidney disease Mother   . Stroke Father   . Stroke Brother   . Cancer Sister   . Cancer Sister   . Diabetes Sister   . Healthy Daughter   . Healthy Daughter   . Healthy Son     ALLERGIES: No Known Allergies    I spent 60 minutes providing this consultation; this includes time spent with patient/family, chart review and documentation. More than 50% of the time in this consultation was spent on counseling and coordinating communication   Thank you for the opportunity to participate in the care of JA OHMAN Please call our office at [phone] if we can be of additional assistance.  Note: Portions of this note were generated with Lobbyist. Dictation errors may occur despite best attempts at proofreading.  Teodoro Spray, NP

## 2020-08-20 NOTE — Progress Notes (Signed)
Assessment/Plan:   1.  Parkinsons Disease             -Continue carbidopa/levodopa 25/100 CR, 1 tablet 4 times per day but change timing of med to 7am/10am/1pm/4pm (he is going to bed at 6pm).  -continue carbidopa/levodopa 50/200 CR at bed  -Unfortunately, after he fractured his hip, he really has not gained the ability to walk again.  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  Tried to explain apraxia to the patient and wife and daughter.  I think it is unfortunate that they had declined inpatient rehab from the SNF as this may have been helpful for him.  In the home, his wife cannot use the [facility] lift and therefore, he is in bed all day.  He is sleeping the entire day.  We talked extensively about regular daily schedule.  2.  GAD/depression             -Refuses Lexapro  3.  B12 deficiency             -takes B12 supplement  4.  RLS             -On iron supplementation for iron deficiency.  5.  Insomnia/hallucinations/agitation             -stop quetiapine.  Doubt it is contributing to EDS since only at 12.5 q hs.  They will let me know if things get worse off of the quetiapine, but do not think so.  We could always consider Nuplazid.   Subjective:   Rodney Love was seen today in follow up for Parkinsons disease.  My previous records were reviewed prior to todays visit as well as outside records available to me.  Patient wife present and supplements history.  Slightly increased the patient's quetiapine last visit for hallucinations, insomnia, behavioral issues.  Today they report that he was doing better in this regard before his hip fx.  After his hip fracture, he is asking a lot, "when are we going to get out of this place and go home."  Sometimes he is agitated.  He is sleeping a lot - 18/24 hours per wife.  Wife wakes him up just to give him levodopa and he falls back asleep  Pt had hip sx  in feb following a fall that resulted in a femur fx (didn't even go to hospital for 5 days post fall).  Had hip replacement.  SNF rehab declined by family.  Took him home with home help and palliative care.  Notes are fully reviewed.  PT/OT/ST is still coming to the house.  Rarely walked since surgery - maybe 2 times with PT.  Has trouble pulling self up from seated position.  They say that he is "stiff."  Current prescribed movement disorder medications: Carbidopa/levodopa 25/100 CR, 1 tablet 4 times per day (6am/10am/2pm/6pm) Carbidopa/levodopa 50/200 at bedtime Quetiapine, 25mg , 1/2 in the AM and 1 q hs.  May take additional 1/2 tablet prn(increased last visit) -  They never give the extra and he is only taking taking 1/2 q hs and none in the AM B12 supplement  Ferrous sulfate, 325 mg twice per day with vitamin C   ALLERGIES:  No Known Allergies  CURRENT MEDICATIONS:  Outpatient Encounter Medications as of 08/22/2020  Medication Sig  . acetaminophen (TYLENOL) 500 MG tablet Take 500 mg by mouth every 6 (six) hours as  needed for moderate pain.  Marland Kitchen atorvastatin (LIPITOR) 20 MG tablet Take 20 mg by mouth daily.   . carbidopa-levodopa (SINEMET CR) 50-200 MG tablet TAKE 1 TABLET BY MOUTH AT BEDTIME  . Carbidopa-Levodopa ER (SINEMET CR) 25-100 MG tablet controlled release TAKE 1 TABLET BY MOUTH IN THE MORNING, AT NOON, IN THE EVENING, AND AT BEDTIME.(6AM/10AM/2PM/6PM)  . cholecalciferol (VITAMIN D3) 25 MCG (1000 UNIT) tablet Take 1,000 Units by mouth daily.  . Cyanocobalamin (VITAMIN B 12 PO) Take 1 tablet by mouth daily.  . ferrous sulfate 324 MG TBEC Take 324 mg by mouth daily at 6 (six) AM.  . metFORMIN (GLUCOPHAGE) 850 MG tablet Take 850 mg by mouth 3 (three) times a week. Mon/Wed/Fri  . ONETOUCH ULTRA test strip   . QUEtiapine (SEROQUEL) 25 MG tablet 1/2 in the AM and 1 at bed.  May add another 1/2 tablet in the late afternoon if need (Patient taking differently: Take 12.5 mg by mouth at  bedtime.)  . senna (SENOKOT) 8.6 MG TABS tablet Take 1 tablet (8.6 mg total) by mouth 2 (two) times daily.  . [DISCONTINUED] pramipexole (MIRAPEX) 0.125 MG tablet Take 0.125 mg by mouth at bedtime.   Facility-Administered Encounter Medications as of 08/22/2020  Medication  . lidocaine-EPINEPHrine (XYLOCAINE W/EPI) 1 %-1:100000 (with pres) injection 10 mL    Objective:   PHYSICAL EXAMINATION:    VITALS:   Vitals:   08/22/20 1435  BP: 92/62  Pulse: 96  SpO2: 99%  Weight: 164 lb (74.4 kg)  Height: 6' (1.829 m)   Wt Readings from Last 3 Encounters:  08/22/20 164 lb (74.4 kg)  04/28/20 164 lb 0.4 oz (74.4 kg)  03/17/20 164 lb (74.4 kg)     GEN:  The patient appears stated age and is in NAD. HEENT:  Normocephalic, atraumatic.  The mucous membranes are moist.  Neurological examination:  Orientation: The patient is alert and oriented to person/place Cranial nerves: There is good facial symmetry with facial hypomimia. The speech is fluent and clear. Soft palate rises symmetrically and there is no tongue deviation. Hearing is intact to conversational tone. Sensation: Sensation is intact to light touch throughout Motor: Strength is at least antigravity x4.  Movement examination: Tone: There is normal tone in the bilateral upper extremities today and bilateral lower extremities (this is actually much improved) Abnormal movements: Rare left upper extremity rest tremor. Coordination:  There is decremation mostly with foot taps on the left. Gait and Station: Not tested as patient and daughter state that he really is not walking anymore.  I have reviewed and interpreted the following labs independently    Chemistry      Component Value Date/Time   NA 139 05/01/2020 0559   K 3.7 05/01/2020 0559   CL 110 05/01/2020 0559   CO2 23 05/01/2020 0559   BUN 15 05/01/2020 0559   CREATININE 0.71 05/01/2020 0559      Component Value Date/Time   CALCIUM 7.8 (L) 05/01/2020 0559   ALKPHOS  70 04/29/2020 0425   AST 14 (L) 04/29/2020 0425   ALT 17 04/29/2020 0425   BILITOT 1.6 (H) 04/29/2020 0425       Lab Results  Component Value Date   WBC 6.2 05/02/2020   HGB 10.0 (L) 05/02/2020   HCT 31.0 (L) 05/02/2020   MCV 97.8 05/02/2020   PLT 143 (L) 05/02/2020    No results found for: TSH   Total time spent on today's visit was 40 minutes, including both face-to-face  time and nonface-to-face time.  Time included that spent on review of records (prior notes available to me/labs/imaging if pertinent), discussing treatment and goals, answering patient's questions and coordinating care.  Cc:  Lujean Amel, MD

## 2020-08-21 DIAGNOSIS — E1151 Type 2 diabetes mellitus with diabetic peripheral angiopathy without gangrene: Secondary | ICD-10-CM | POA: Diagnosis not present

## 2020-08-21 DIAGNOSIS — I69820 Aphasia following other cerebrovascular disease: Secondary | ICD-10-CM | POA: Diagnosis not present

## 2020-08-21 DIAGNOSIS — Z9181 History of falling: Secondary | ICD-10-CM | POA: Diagnosis not present

## 2020-08-21 DIAGNOSIS — Z7984 Long term (current) use of oral hypoglycemic drugs: Secondary | ICD-10-CM | POA: Diagnosis not present

## 2020-08-21 DIAGNOSIS — Z96641 Presence of right artificial hip joint: Secondary | ICD-10-CM | POA: Diagnosis not present

## 2020-08-21 DIAGNOSIS — R498 Other voice and resonance disorders: Secondary | ICD-10-CM | POA: Diagnosis not present

## 2020-08-21 DIAGNOSIS — Z79899 Other long term (current) drug therapy: Secondary | ICD-10-CM | POA: Diagnosis not present

## 2020-08-21 DIAGNOSIS — I771 Stricture of artery: Secondary | ICD-10-CM | POA: Diagnosis not present

## 2020-08-21 DIAGNOSIS — I6521 Occlusion and stenosis of right carotid artery: Secondary | ICD-10-CM | POA: Diagnosis not present

## 2020-08-21 DIAGNOSIS — E785 Hyperlipidemia, unspecified: Secondary | ICD-10-CM | POA: Diagnosis not present

## 2020-08-21 DIAGNOSIS — G20 Parkinson's disease: Secondary | ICD-10-CM | POA: Diagnosis not present

## 2020-08-21 DIAGNOSIS — Z471 Aftercare following joint replacement surgery: Secondary | ICD-10-CM | POA: Diagnosis not present

## 2020-08-21 DIAGNOSIS — Z87891 Personal history of nicotine dependence: Secondary | ICD-10-CM | POA: Diagnosis not present

## 2020-08-21 DIAGNOSIS — G2581 Restless legs syndrome: Secondary | ICD-10-CM | POA: Diagnosis not present

## 2020-08-22 ENCOUNTER — Encounter: Payer: Self-pay | Admitting: Neurology

## 2020-08-22 ENCOUNTER — Ambulatory Visit: Payer: Medicare Other | Admitting: Neurology

## 2020-08-22 ENCOUNTER — Other Ambulatory Visit: Payer: Self-pay

## 2020-08-22 VITALS — BP 92/62 | HR 96 | Ht 72.0 in | Wt 164.0 lb

## 2020-08-22 DIAGNOSIS — G20 Parkinson's disease: Secondary | ICD-10-CM

## 2020-08-22 DIAGNOSIS — F0281 Dementia in other diseases classified elsewhere with behavioral disturbance: Secondary | ICD-10-CM | POA: Diagnosis not present

## 2020-08-22 DIAGNOSIS — F02818 Dementia in other diseases classified elsewhere, unspecified severity, with other behavioral disturbance: Secondary | ICD-10-CM

## 2020-08-22 MED ORDER — CARBIDOPA-LEVODOPA ER 25-100 MG PO TBCR
1.0000 | EXTENDED_RELEASE_TABLET | Freq: Four times a day (QID) | ORAL | 1 refills | Status: DC
Start: 2020-08-22 — End: 2021-01-29

## 2020-08-22 NOTE — Patient Instructions (Signed)
STOP quetiapine  Take carbidopa/levodopa 25/100 CR at 7am/10am/1pm/4pm  Take carbidopa/levodopa 50/200 at bedtime  No naps after 2 pm and naps should not be longer than 1 hour.  He should be awake in the AM by 10am.  The physicians and staff at [facility] are committed to providing excellent care. You may receive a survey requesting feedback about your experience at our office. We strive to receive "very good" responses to the survey questions. If you feel that your experience would prevent you from giving the office a "very good " response, please contact our office to try to remedy the situation. We may be reached at [phone]. Thank you for taking the time out of your busy day to complete the survey.

## 2020-08-23 DIAGNOSIS — I6521 Occlusion and stenosis of right carotid artery: Secondary | ICD-10-CM | POA: Diagnosis not present

## 2020-08-23 DIAGNOSIS — E1151 Type 2 diabetes mellitus with diabetic peripheral angiopathy without gangrene: Secondary | ICD-10-CM | POA: Diagnosis not present

## 2020-08-23 DIAGNOSIS — G20 Parkinson's disease: Secondary | ICD-10-CM | POA: Diagnosis not present

## 2020-08-23 DIAGNOSIS — Z79899 Other long term (current) drug therapy: Secondary | ICD-10-CM | POA: Diagnosis not present

## 2020-08-23 DIAGNOSIS — Z87891 Personal history of nicotine dependence: Secondary | ICD-10-CM | POA: Diagnosis not present

## 2020-08-23 DIAGNOSIS — I771 Stricture of artery: Secondary | ICD-10-CM | POA: Diagnosis not present

## 2020-08-23 DIAGNOSIS — Z7984 Long term (current) use of oral hypoglycemic drugs: Secondary | ICD-10-CM | POA: Diagnosis not present

## 2020-08-23 DIAGNOSIS — G2581 Restless legs syndrome: Secondary | ICD-10-CM | POA: Diagnosis not present

## 2020-08-23 DIAGNOSIS — Z471 Aftercare following joint replacement surgery: Secondary | ICD-10-CM | POA: Diagnosis not present

## 2020-08-23 DIAGNOSIS — I69820 Aphasia following other cerebrovascular disease: Secondary | ICD-10-CM | POA: Diagnosis not present

## 2020-08-23 DIAGNOSIS — Z9181 History of falling: Secondary | ICD-10-CM | POA: Diagnosis not present

## 2020-08-23 DIAGNOSIS — R498 Other voice and resonance disorders: Secondary | ICD-10-CM | POA: Diagnosis not present

## 2020-08-23 DIAGNOSIS — E785 Hyperlipidemia, unspecified: Secondary | ICD-10-CM | POA: Diagnosis not present

## 2020-08-23 DIAGNOSIS — Z96641 Presence of right artificial hip joint: Secondary | ICD-10-CM | POA: Diagnosis not present

## 2020-08-24 DIAGNOSIS — I69820 Aphasia following other cerebrovascular disease: Secondary | ICD-10-CM | POA: Diagnosis not present

## 2020-08-24 DIAGNOSIS — E1151 Type 2 diabetes mellitus with diabetic peripheral angiopathy without gangrene: Secondary | ICD-10-CM | POA: Diagnosis not present

## 2020-08-24 DIAGNOSIS — I6521 Occlusion and stenosis of right carotid artery: Secondary | ICD-10-CM | POA: Diagnosis not present

## 2020-08-24 DIAGNOSIS — Z79899 Other long term (current) drug therapy: Secondary | ICD-10-CM | POA: Diagnosis not present

## 2020-08-24 DIAGNOSIS — Z9181 History of falling: Secondary | ICD-10-CM | POA: Diagnosis not present

## 2020-08-24 DIAGNOSIS — Z96641 Presence of right artificial hip joint: Secondary | ICD-10-CM | POA: Diagnosis not present

## 2020-08-24 DIAGNOSIS — Z87891 Personal history of nicotine dependence: Secondary | ICD-10-CM | POA: Diagnosis not present

## 2020-08-24 DIAGNOSIS — E785 Hyperlipidemia, unspecified: Secondary | ICD-10-CM | POA: Diagnosis not present

## 2020-08-24 DIAGNOSIS — I771 Stricture of artery: Secondary | ICD-10-CM | POA: Diagnosis not present

## 2020-08-24 DIAGNOSIS — G20 Parkinson's disease: Secondary | ICD-10-CM | POA: Diagnosis not present

## 2020-08-24 DIAGNOSIS — Z471 Aftercare following joint replacement surgery: Secondary | ICD-10-CM | POA: Diagnosis not present

## 2020-08-24 DIAGNOSIS — Z7984 Long term (current) use of oral hypoglycemic drugs: Secondary | ICD-10-CM | POA: Diagnosis not present

## 2020-08-24 DIAGNOSIS — R498 Other voice and resonance disorders: Secondary | ICD-10-CM | POA: Diagnosis not present

## 2020-08-24 DIAGNOSIS — G2581 Restless legs syndrome: Secondary | ICD-10-CM | POA: Diagnosis not present

## 2020-08-25 ENCOUNTER — Telehealth: Payer: Self-pay | Admitting: Hospice

## 2020-08-25 NOTE — Telephone Encounter (Signed)
Patient's daughter called to report that patient's spouse has rescinded DNR status for patient. She said this was after a family meeting and discussions with patient. NP provided validation and emotional support and promised to void it in Epic records.

## 2020-08-29 ENCOUNTER — Telehealth: Payer: Medicare Other | Admitting: Physician Assistant

## 2020-08-29 DIAGNOSIS — G2581 Restless legs syndrome: Secondary | ICD-10-CM | POA: Diagnosis not present

## 2020-08-29 DIAGNOSIS — I6521 Occlusion and stenosis of right carotid artery: Secondary | ICD-10-CM | POA: Diagnosis not present

## 2020-08-29 DIAGNOSIS — I771 Stricture of artery: Secondary | ICD-10-CM | POA: Diagnosis not present

## 2020-08-29 DIAGNOSIS — E1151 Type 2 diabetes mellitus with diabetic peripheral angiopathy without gangrene: Secondary | ICD-10-CM | POA: Diagnosis not present

## 2020-08-29 DIAGNOSIS — Z9181 History of falling: Secondary | ICD-10-CM | POA: Diagnosis not present

## 2020-08-29 DIAGNOSIS — J329 Chronic sinusitis, unspecified: Secondary | ICD-10-CM

## 2020-08-29 DIAGNOSIS — Z79899 Other long term (current) drug therapy: Secondary | ICD-10-CM | POA: Diagnosis not present

## 2020-08-29 DIAGNOSIS — E785 Hyperlipidemia, unspecified: Secondary | ICD-10-CM | POA: Diagnosis not present

## 2020-08-29 DIAGNOSIS — R498 Other voice and resonance disorders: Secondary | ICD-10-CM | POA: Diagnosis not present

## 2020-08-29 DIAGNOSIS — Z87891 Personal history of nicotine dependence: Secondary | ICD-10-CM | POA: Diagnosis not present

## 2020-08-29 DIAGNOSIS — Z471 Aftercare following joint replacement surgery: Secondary | ICD-10-CM | POA: Diagnosis not present

## 2020-08-29 DIAGNOSIS — G20 Parkinson's disease: Secondary | ICD-10-CM | POA: Diagnosis not present

## 2020-08-29 DIAGNOSIS — I69820 Aphasia following other cerebrovascular disease: Secondary | ICD-10-CM | POA: Diagnosis not present

## 2020-08-29 DIAGNOSIS — Z96641 Presence of right artificial hip joint: Secondary | ICD-10-CM | POA: Diagnosis not present

## 2020-08-29 DIAGNOSIS — Z7984 Long term (current) use of oral hypoglycemic drugs: Secondary | ICD-10-CM | POA: Diagnosis not present

## 2020-08-29 NOTE — Progress Notes (Signed)
Based on what you shared with me, I feel your condition warrants further evaluation and I recommend that you be seen for a face to face visit.  I understand this may be difficult given your limitations, but a visit with your PCP, even virtual will help them determine the best course of action as they understand your baseline.  Please contact your primary care physician practice to be seen. Many offices offer virtual options to be seen via video if you are not comfortable going in person to a medical facility at this time.  If you do not have a PCP, [facility] offers a free physician referral service available at [phone]. Our trained staff has the experience, knowledge and resources to put you in touch with a physician who is right for you.   You also have the option of a video visit through [email or web address]  If you are having a true medical emergency please call 911.  NOTE: If you entered your credit card information for this eVisit, you will not be charged. You may see a "hold" on your card for the $35 but that hold will drop off and you will not have a charge processed.  Your e-visit answers were reviewed by a board certified advanced clinical practitioner to complete your personal care plan.  Thank you for using e-Visits.  Greater than 5 minutes, yet less than 10 minutes of time have been spent researching, coordinating, and implementing care for this patient today

## 2020-08-30 DIAGNOSIS — I6521 Occlusion and stenosis of right carotid artery: Secondary | ICD-10-CM | POA: Diagnosis not present

## 2020-08-30 DIAGNOSIS — Z9181 History of falling: Secondary | ICD-10-CM | POA: Diagnosis not present

## 2020-08-30 DIAGNOSIS — G20 Parkinson's disease: Secondary | ICD-10-CM | POA: Diagnosis not present

## 2020-08-30 DIAGNOSIS — Z7984 Long term (current) use of oral hypoglycemic drugs: Secondary | ICD-10-CM | POA: Diagnosis not present

## 2020-08-30 DIAGNOSIS — Z87891 Personal history of nicotine dependence: Secondary | ICD-10-CM | POA: Diagnosis not present

## 2020-08-30 DIAGNOSIS — I69820 Aphasia following other cerebrovascular disease: Secondary | ICD-10-CM | POA: Diagnosis not present

## 2020-08-30 DIAGNOSIS — Z471 Aftercare following joint replacement surgery: Secondary | ICD-10-CM | POA: Diagnosis not present

## 2020-08-30 DIAGNOSIS — G2581 Restless legs syndrome: Secondary | ICD-10-CM | POA: Diagnosis not present

## 2020-08-30 DIAGNOSIS — R498 Other voice and resonance disorders: Secondary | ICD-10-CM | POA: Diagnosis not present

## 2020-08-30 DIAGNOSIS — Z79899 Other long term (current) drug therapy: Secondary | ICD-10-CM | POA: Diagnosis not present

## 2020-08-30 DIAGNOSIS — I771 Stricture of artery: Secondary | ICD-10-CM | POA: Diagnosis not present

## 2020-08-30 DIAGNOSIS — Z96641 Presence of right artificial hip joint: Secondary | ICD-10-CM | POA: Diagnosis not present

## 2020-08-30 DIAGNOSIS — E1151 Type 2 diabetes mellitus with diabetic peripheral angiopathy without gangrene: Secondary | ICD-10-CM | POA: Diagnosis not present

## 2020-08-30 DIAGNOSIS — E785 Hyperlipidemia, unspecified: Secondary | ICD-10-CM | POA: Diagnosis not present

## 2020-09-01 DIAGNOSIS — Z96641 Presence of right artificial hip joint: Secondary | ICD-10-CM | POA: Diagnosis not present

## 2020-09-01 DIAGNOSIS — G2581 Restless legs syndrome: Secondary | ICD-10-CM | POA: Diagnosis not present

## 2020-09-01 DIAGNOSIS — E1151 Type 2 diabetes mellitus with diabetic peripheral angiopathy without gangrene: Secondary | ICD-10-CM | POA: Diagnosis not present

## 2020-09-01 DIAGNOSIS — G20 Parkinson's disease: Secondary | ICD-10-CM | POA: Diagnosis not present

## 2020-09-01 DIAGNOSIS — Z7984 Long term (current) use of oral hypoglycemic drugs: Secondary | ICD-10-CM | POA: Diagnosis not present

## 2020-09-01 DIAGNOSIS — I6521 Occlusion and stenosis of right carotid artery: Secondary | ICD-10-CM | POA: Diagnosis not present

## 2020-09-01 DIAGNOSIS — I771 Stricture of artery: Secondary | ICD-10-CM | POA: Diagnosis not present

## 2020-09-01 DIAGNOSIS — Z79899 Other long term (current) drug therapy: Secondary | ICD-10-CM | POA: Diagnosis not present

## 2020-09-01 DIAGNOSIS — Z9181 History of falling: Secondary | ICD-10-CM | POA: Diagnosis not present

## 2020-09-01 DIAGNOSIS — E785 Hyperlipidemia, unspecified: Secondary | ICD-10-CM | POA: Diagnosis not present

## 2020-09-01 DIAGNOSIS — I69820 Aphasia following other cerebrovascular disease: Secondary | ICD-10-CM | POA: Diagnosis not present

## 2020-09-01 DIAGNOSIS — R498 Other voice and resonance disorders: Secondary | ICD-10-CM | POA: Diagnosis not present

## 2020-09-01 DIAGNOSIS — Z87891 Personal history of nicotine dependence: Secondary | ICD-10-CM | POA: Diagnosis not present

## 2020-09-01 DIAGNOSIS — Z471 Aftercare following joint replacement surgery: Secondary | ICD-10-CM | POA: Diagnosis not present

## 2020-09-02 DIAGNOSIS — S7291XA Unspecified fracture of right femur, initial encounter for closed fracture: Secondary | ICD-10-CM | POA: Diagnosis not present

## 2020-09-02 DIAGNOSIS — G20 Parkinson's disease: Secondary | ICD-10-CM | POA: Diagnosis not present

## 2020-09-02 DIAGNOSIS — S72001S Fracture of unspecified part of neck of right femur, sequela: Secondary | ICD-10-CM | POA: Diagnosis not present

## 2020-09-02 DIAGNOSIS — I739 Peripheral vascular disease, unspecified: Secondary | ICD-10-CM | POA: Diagnosis not present

## 2020-09-05 DIAGNOSIS — Z471 Aftercare following joint replacement surgery: Secondary | ICD-10-CM | POA: Diagnosis not present

## 2020-09-05 DIAGNOSIS — I69820 Aphasia following other cerebrovascular disease: Secondary | ICD-10-CM | POA: Diagnosis not present

## 2020-09-05 DIAGNOSIS — E785 Hyperlipidemia, unspecified: Secondary | ICD-10-CM | POA: Diagnosis not present

## 2020-09-05 DIAGNOSIS — R498 Other voice and resonance disorders: Secondary | ICD-10-CM | POA: Diagnosis not present

## 2020-09-05 DIAGNOSIS — E1151 Type 2 diabetes mellitus with diabetic peripheral angiopathy without gangrene: Secondary | ICD-10-CM | POA: Diagnosis not present

## 2020-09-05 DIAGNOSIS — Z9181 History of falling: Secondary | ICD-10-CM | POA: Diagnosis not present

## 2020-09-05 DIAGNOSIS — Z87891 Personal history of nicotine dependence: Secondary | ICD-10-CM | POA: Diagnosis not present

## 2020-09-05 DIAGNOSIS — G2581 Restless legs syndrome: Secondary | ICD-10-CM | POA: Diagnosis not present

## 2020-09-05 DIAGNOSIS — I771 Stricture of artery: Secondary | ICD-10-CM | POA: Diagnosis not present

## 2020-09-05 DIAGNOSIS — Z79899 Other long term (current) drug therapy: Secondary | ICD-10-CM | POA: Diagnosis not present

## 2020-09-05 DIAGNOSIS — I6521 Occlusion and stenosis of right carotid artery: Secondary | ICD-10-CM | POA: Diagnosis not present

## 2020-09-05 DIAGNOSIS — Z96641 Presence of right artificial hip joint: Secondary | ICD-10-CM | POA: Diagnosis not present

## 2020-09-05 DIAGNOSIS — J0100 Acute maxillary sinusitis, unspecified: Secondary | ICD-10-CM | POA: Diagnosis not present

## 2020-09-05 DIAGNOSIS — Z7984 Long term (current) use of oral hypoglycemic drugs: Secondary | ICD-10-CM | POA: Diagnosis not present

## 2020-09-05 DIAGNOSIS — G20 Parkinson's disease: Secondary | ICD-10-CM | POA: Diagnosis not present

## 2020-09-06 DIAGNOSIS — I771 Stricture of artery: Secondary | ICD-10-CM | POA: Diagnosis not present

## 2020-09-06 DIAGNOSIS — E1151 Type 2 diabetes mellitus with diabetic peripheral angiopathy without gangrene: Secondary | ICD-10-CM | POA: Diagnosis not present

## 2020-09-06 DIAGNOSIS — Z79899 Other long term (current) drug therapy: Secondary | ICD-10-CM | POA: Diagnosis not present

## 2020-09-06 DIAGNOSIS — Z87891 Personal history of nicotine dependence: Secondary | ICD-10-CM | POA: Diagnosis not present

## 2020-09-06 DIAGNOSIS — Z471 Aftercare following joint replacement surgery: Secondary | ICD-10-CM | POA: Diagnosis not present

## 2020-09-06 DIAGNOSIS — Z7984 Long term (current) use of oral hypoglycemic drugs: Secondary | ICD-10-CM | POA: Diagnosis not present

## 2020-09-06 DIAGNOSIS — Z96641 Presence of right artificial hip joint: Secondary | ICD-10-CM | POA: Diagnosis not present

## 2020-09-06 DIAGNOSIS — G20 Parkinson's disease: Secondary | ICD-10-CM | POA: Diagnosis not present

## 2020-09-06 DIAGNOSIS — I6521 Occlusion and stenosis of right carotid artery: Secondary | ICD-10-CM | POA: Diagnosis not present

## 2020-09-06 DIAGNOSIS — E785 Hyperlipidemia, unspecified: Secondary | ICD-10-CM | POA: Diagnosis not present

## 2020-09-06 DIAGNOSIS — Z9181 History of falling: Secondary | ICD-10-CM | POA: Diagnosis not present

## 2020-09-06 DIAGNOSIS — G2581 Restless legs syndrome: Secondary | ICD-10-CM | POA: Diagnosis not present

## 2020-09-06 DIAGNOSIS — I69820 Aphasia following other cerebrovascular disease: Secondary | ICD-10-CM | POA: Diagnosis not present

## 2020-09-06 DIAGNOSIS — R498 Other voice and resonance disorders: Secondary | ICD-10-CM | POA: Diagnosis not present

## 2020-09-08 DIAGNOSIS — E1151 Type 2 diabetes mellitus with diabetic peripheral angiopathy without gangrene: Secondary | ICD-10-CM | POA: Diagnosis not present

## 2020-09-08 DIAGNOSIS — R499 Unspecified voice and resonance disorder: Secondary | ICD-10-CM | POA: Diagnosis not present

## 2020-09-08 DIAGNOSIS — Z7984 Long term (current) use of oral hypoglycemic drugs: Secondary | ICD-10-CM | POA: Diagnosis not present

## 2020-09-08 DIAGNOSIS — I6521 Occlusion and stenosis of right carotid artery: Secondary | ICD-10-CM | POA: Diagnosis not present

## 2020-09-08 DIAGNOSIS — Z96641 Presence of right artificial hip joint: Secondary | ICD-10-CM | POA: Diagnosis not present

## 2020-09-08 DIAGNOSIS — Z471 Aftercare following joint replacement surgery: Secondary | ICD-10-CM | POA: Diagnosis not present

## 2020-09-08 DIAGNOSIS — Z79899 Other long term (current) drug therapy: Secondary | ICD-10-CM | POA: Diagnosis not present

## 2020-09-08 DIAGNOSIS — Z87891 Personal history of nicotine dependence: Secondary | ICD-10-CM | POA: Diagnosis not present

## 2020-09-08 DIAGNOSIS — Z9181 History of falling: Secondary | ICD-10-CM | POA: Diagnosis not present

## 2020-09-08 DIAGNOSIS — G2581 Restless legs syndrome: Secondary | ICD-10-CM | POA: Diagnosis not present

## 2020-09-08 DIAGNOSIS — G20 Parkinson's disease: Secondary | ICD-10-CM | POA: Diagnosis not present

## 2020-09-08 DIAGNOSIS — I69820 Aphasia following other cerebrovascular disease: Secondary | ICD-10-CM | POA: Diagnosis not present

## 2020-09-08 DIAGNOSIS — I771 Stricture of artery: Secondary | ICD-10-CM | POA: Diagnosis not present

## 2020-09-08 DIAGNOSIS — R498 Other voice and resonance disorders: Secondary | ICD-10-CM | POA: Diagnosis not present

## 2020-09-08 DIAGNOSIS — E785 Hyperlipidemia, unspecified: Secondary | ICD-10-CM | POA: Diagnosis not present

## 2020-09-11 ENCOUNTER — Other Ambulatory Visit: Payer: Medicare Other | Admitting: Hospice

## 2020-09-11 ENCOUNTER — Other Ambulatory Visit: Payer: Self-pay

## 2020-09-11 DIAGNOSIS — G20 Parkinson's disease: Secondary | ICD-10-CM

## 2020-09-11 DIAGNOSIS — R531 Weakness: Secondary | ICD-10-CM | POA: Diagnosis not present

## 2020-09-11 DIAGNOSIS — F0390 Unspecified dementia without behavioral disturbance: Secondary | ICD-10-CM

## 2020-09-11 DIAGNOSIS — Z515 Encounter for palliative care: Secondary | ICD-10-CM

## 2020-09-11 NOTE — Progress Notes (Signed)
[facility] Consult Note Telephone: [phone]  Fax: [phone]  PATIENT NAME: Rodney Love DOB: 02-Nov-1945 MRN: [id number]  PRIMARY CARE PROVIDER:   Lujean Amel, MD Lujean Amel, [address],  [place] [address]  REFERRING PROVIDER: Lujean Amel, MD Lujean Amel, MD [address],  [place] [address]  RESPONSIBLE PARTY: Spouse [phone] h best number to call [place] [phone]  Extended Emergency Contact Information Primary Emergency Contact: Dallas Breeding For emails  Home Phone: [phone] Mobile Phone: [phone] Relation: Daughter Secondary Emergency Contact: Ellender Hose Address: [place], [place] Phone: [phone] Mobile Phone: [phone] Relation: Spouse - HCPOA [name] - daughter [phone] is the oldest daughter lives in [place] Due to the COVID-19 crisis, this visit was done via telemedicine and it was initiated and consent by this patient and or family. Contact Information     Name Relation Home Work Mobile   [place] Spouse [phone]  [phone]   [place] Breeding Daughter [phone]  [phone]   [name] Daughter [phone]        Visit is to build trust and highlight Palliative Medicine as specialized medical care for people living with serious illness, aimed at facilitating better quality of life through symptoms relief, assisting with advance care planning and complex medical decision making. Spouse is with patient during visit. NP also spoke with [name] over the telephone on patient's status.   RECOMMENDATIONS/PLAN:   Advance Care Planning/Code Status:Patient is a Full code  Goals of Care: Goals of care include to maximize quality of life and symptom management.  Visit consisted of  counseling and education dealing with the complex and emotionally intense issues of symptom management and palliative care in the setting of serious and potentially life-threatening illness.  Spouse reports that spirituality and affiliation to a local [place] provides fortitude.  Palliative care team will continue to support patient, patient's family, and medical team.  Symptom management/Plan:  Weakness: Patient has been re-certified for PT/OT; to commence likely this week. Fall precautions discussed.  Patient on Amoxycilin for sinus infection. Education provided on the need to take it to completion, even as patient begins to feel better. Dementia:Fast 7B, FLACC 0.  PPS down from 40% last month to currently 30%, bedbound, incontinent of bowel and bladder and not able to engage in sustained meaningful conversation. Ongoing education on dementia disease trajectory.  Continue ongoing supportive care.  Routine CBC BMP. Parkinson disease: Continue Sinemet as ordered by neurologist.  Neurologist consult as planned/needed Dizziness: Recommendation: Meclizine 25mg  BID prn dizziness.  Fall precautions emphasized. Follow up: Palliative care will continue to follow for complex medical decision making, advance care planning, and clarification of goals. Return 6 weeks or prn. Encouraged to call provider sooner with any concerns.  CHIEF COMPLAINT: Palliative follow up visit/weakness  HISTORY OF PRESENT ILLNESS:  Rodney Love a 75 y.o. male with multiple medical problems including worsening weakness in the context of advanced dementia and Parkinson disease.  Weakness worsened in the last 2 weeks especially as patient stopped getting physical therapy.  Patient not able to get out of bed due to worsening weakness, sleeping more up to 15 hours a day, and sometimes complains of dizziness.  Weakness impairs his quality of life; it is worse during the day/hygiene  care; past recent physical therapy was helpful; rest  and sleeping also helpful, per patient.  Patient has been recertified for physical therapy; physical therapy expected to commence this week.  He denies pain/discomfort.  History of Parkinson disease, advanced dementia, CAD, hypertension, syncope.  History obtained from review of EMR, discussion with primary team, family and/or patient. Records reviewed and summarized above. All 10 point systems reviewed and are negative except as documented in history of present illness above  Review and summarization of Epic records shows history from other than patient.   Palliative Care was asked to follow this patient o help address complex decision making in the context of advance care planning and goals of care clarification.   PPS: 30%  PERTINENT MEDICATIONS:  Outpatient Encounter Medications as of 09/11/2020  Medication Sig   acetaminophen (TYLENOL) 500 MG tablet Take 500 mg by mouth every 6 (six) hours as needed for moderate pain.   atorvastatin (LIPITOR) 20 MG tablet Take 20 mg by mouth daily.    carbidopa-levodopa (SINEMET CR) 50-200 MG tablet TAKE 1 TABLET BY MOUTH AT BEDTIME   Carbidopa-Levodopa ER (SINEMET CR) 25-100 MG tablet controlled release Take 1 tablet by mouth in the morning, at noon, in the evening, and at bedtime. 7am/10am/1pm/4pm   cholecalciferol (VITAMIN D3) 25 MCG (1000 UNIT) tablet Take 1,000 Units by mouth daily.   Cyanocobalamin (VITAMIN B 12 PO) Take 1 tablet by mouth daily.   ferrous sulfate 324 MG TBEC Take 324 mg by mouth daily at 6 (six) AM.   metFORMIN (GLUCOPHAGE) 850 MG tablet Take 850 mg by mouth 3 (three) times a week. Mon/Wed/Fri   ONETOUCH ULTRA test strip    senna (SENOKOT) 8.6 MG TABS tablet Take 1 tablet (8.6 mg total) by mouth 2 (two) times daily.   [DISCONTINUED] pramipexole (MIRAPEX) 0.125 MG tablet Take 0.125 mg by mouth at bedtime.   Facility-Administered Encounter Medications as of 09/11/2020  Medication   lidocaine-EPINEPHrine (XYLOCAINE W/EPI) 1 %-1:100000  (with pres) injection 10 mL    HOSPICE ELIGIBILITY/DIAGNOSIS: TBD  PAST MEDICAL HISTORY:  Past Medical History:  Diagnosis Date   Arthritis    Constipation    OCCASIONAL   Coronary artery disease    Diabetes mellitus without complication ([facility])    Environmental allergies    Hyperlipidemia    Hypertension    Inguinal hernia    Iron deficiency    Nocturia    Parkinson disease ([facility])    Peripheral arterial disease ([facility])     I spent 60 minutes providing this consultation; this includes time spent with patient/family, chart review and documentation. More than 50% of the time in this consultation was spent on counseling and coordinating communication   ALLERGIES: No Known Allergies    Thank you for the opportunity to participate in the care of JOBAN COLLEDGE Please call our office at [phone] if we can be of additional assistance.  Note: Portions of this note were generated with Lobbyist. Dictation errors may occur despite best attempts at proofreading.  Teodoro Spray, NP

## 2020-09-12 DIAGNOSIS — Z87891 Personal history of nicotine dependence: Secondary | ICD-10-CM | POA: Diagnosis not present

## 2020-09-12 DIAGNOSIS — Z79899 Other long term (current) drug therapy: Secondary | ICD-10-CM | POA: Diagnosis not present

## 2020-09-12 DIAGNOSIS — G2581 Restless legs syndrome: Secondary | ICD-10-CM | POA: Diagnosis not present

## 2020-09-12 DIAGNOSIS — R499 Unspecified voice and resonance disorder: Secondary | ICD-10-CM | POA: Diagnosis not present

## 2020-09-12 DIAGNOSIS — I771 Stricture of artery: Secondary | ICD-10-CM | POA: Diagnosis not present

## 2020-09-12 DIAGNOSIS — E785 Hyperlipidemia, unspecified: Secondary | ICD-10-CM | POA: Diagnosis not present

## 2020-09-12 DIAGNOSIS — Z7984 Long term (current) use of oral hypoglycemic drugs: Secondary | ICD-10-CM | POA: Diagnosis not present

## 2020-09-12 DIAGNOSIS — I69820 Aphasia following other cerebrovascular disease: Secondary | ICD-10-CM | POA: Diagnosis not present

## 2020-09-12 DIAGNOSIS — R498 Other voice and resonance disorders: Secondary | ICD-10-CM | POA: Diagnosis not present

## 2020-09-12 DIAGNOSIS — Z9181 History of falling: Secondary | ICD-10-CM | POA: Diagnosis not present

## 2020-09-12 DIAGNOSIS — I6521 Occlusion and stenosis of right carotid artery: Secondary | ICD-10-CM | POA: Diagnosis not present

## 2020-09-12 DIAGNOSIS — Z96641 Presence of right artificial hip joint: Secondary | ICD-10-CM | POA: Diagnosis not present

## 2020-09-12 DIAGNOSIS — Z471 Aftercare following joint replacement surgery: Secondary | ICD-10-CM | POA: Diagnosis not present

## 2020-09-12 DIAGNOSIS — G20 Parkinson's disease: Secondary | ICD-10-CM | POA: Diagnosis not present

## 2020-09-12 DIAGNOSIS — E1151 Type 2 diabetes mellitus with diabetic peripheral angiopathy without gangrene: Secondary | ICD-10-CM | POA: Diagnosis not present

## 2020-09-14 ENCOUNTER — Other Ambulatory Visit: Payer: Self-pay | Admitting: Neurology

## 2020-09-16 DIAGNOSIS — I771 Stricture of artery: Secondary | ICD-10-CM | POA: Diagnosis not present

## 2020-09-16 DIAGNOSIS — E1151 Type 2 diabetes mellitus with diabetic peripheral angiopathy without gangrene: Secondary | ICD-10-CM | POA: Diagnosis not present

## 2020-09-16 DIAGNOSIS — Z87891 Personal history of nicotine dependence: Secondary | ICD-10-CM | POA: Diagnosis not present

## 2020-09-16 DIAGNOSIS — G2581 Restless legs syndrome: Secondary | ICD-10-CM | POA: Diagnosis not present

## 2020-09-16 DIAGNOSIS — Z7984 Long term (current) use of oral hypoglycemic drugs: Secondary | ICD-10-CM | POA: Diagnosis not present

## 2020-09-16 DIAGNOSIS — Z9181 History of falling: Secondary | ICD-10-CM | POA: Diagnosis not present

## 2020-09-16 DIAGNOSIS — E785 Hyperlipidemia, unspecified: Secondary | ICD-10-CM | POA: Diagnosis not present

## 2020-09-16 DIAGNOSIS — G20 Parkinson's disease: Secondary | ICD-10-CM | POA: Diagnosis not present

## 2020-09-16 DIAGNOSIS — Z8673 Personal history of transient ischemic attack (TIA), and cerebral infarction without residual deficits: Secondary | ICD-10-CM | POA: Diagnosis not present

## 2020-09-16 DIAGNOSIS — Z96641 Presence of right artificial hip joint: Secondary | ICD-10-CM | POA: Diagnosis not present

## 2020-09-16 DIAGNOSIS — Z79899 Other long term (current) drug therapy: Secondary | ICD-10-CM | POA: Diagnosis not present

## 2020-09-18 DIAGNOSIS — Z7984 Long term (current) use of oral hypoglycemic drugs: Secondary | ICD-10-CM | POA: Diagnosis not present

## 2020-09-18 DIAGNOSIS — G2581 Restless legs syndrome: Secondary | ICD-10-CM | POA: Diagnosis not present

## 2020-09-18 DIAGNOSIS — Z79899 Other long term (current) drug therapy: Secondary | ICD-10-CM | POA: Diagnosis not present

## 2020-09-18 DIAGNOSIS — Z8673 Personal history of transient ischemic attack (TIA), and cerebral infarction without residual deficits: Secondary | ICD-10-CM | POA: Diagnosis not present

## 2020-09-18 DIAGNOSIS — Z9181 History of falling: Secondary | ICD-10-CM | POA: Diagnosis not present

## 2020-09-18 DIAGNOSIS — I771 Stricture of artery: Secondary | ICD-10-CM | POA: Diagnosis not present

## 2020-09-18 DIAGNOSIS — E785 Hyperlipidemia, unspecified: Secondary | ICD-10-CM | POA: Diagnosis not present

## 2020-09-18 DIAGNOSIS — Z87891 Personal history of nicotine dependence: Secondary | ICD-10-CM | POA: Diagnosis not present

## 2020-09-18 DIAGNOSIS — G20 Parkinson's disease: Secondary | ICD-10-CM | POA: Diagnosis not present

## 2020-09-18 DIAGNOSIS — Z96641 Presence of right artificial hip joint: Secondary | ICD-10-CM | POA: Diagnosis not present

## 2020-09-18 DIAGNOSIS — E1151 Type 2 diabetes mellitus with diabetic peripheral angiopathy without gangrene: Secondary | ICD-10-CM | POA: Diagnosis not present

## 2020-09-19 DIAGNOSIS — Z7984 Long term (current) use of oral hypoglycemic drugs: Secondary | ICD-10-CM | POA: Diagnosis not present

## 2020-09-19 DIAGNOSIS — Z96641 Presence of right artificial hip joint: Secondary | ICD-10-CM | POA: Diagnosis not present

## 2020-09-19 DIAGNOSIS — Z87891 Personal history of nicotine dependence: Secondary | ICD-10-CM | POA: Diagnosis not present

## 2020-09-19 DIAGNOSIS — G20 Parkinson's disease: Secondary | ICD-10-CM | POA: Diagnosis not present

## 2020-09-19 DIAGNOSIS — E785 Hyperlipidemia, unspecified: Secondary | ICD-10-CM | POA: Diagnosis not present

## 2020-09-19 DIAGNOSIS — Z8673 Personal history of transient ischemic attack (TIA), and cerebral infarction without residual deficits: Secondary | ICD-10-CM | POA: Diagnosis not present

## 2020-09-19 DIAGNOSIS — G2581 Restless legs syndrome: Secondary | ICD-10-CM | POA: Diagnosis not present

## 2020-09-19 DIAGNOSIS — Z9181 History of falling: Secondary | ICD-10-CM | POA: Diagnosis not present

## 2020-09-19 DIAGNOSIS — I771 Stricture of artery: Secondary | ICD-10-CM | POA: Diagnosis not present

## 2020-09-19 DIAGNOSIS — Z79899 Other long term (current) drug therapy: Secondary | ICD-10-CM | POA: Diagnosis not present

## 2020-09-19 DIAGNOSIS — E1151 Type 2 diabetes mellitus with diabetic peripheral angiopathy without gangrene: Secondary | ICD-10-CM | POA: Diagnosis not present

## 2020-09-20 DIAGNOSIS — G20 Parkinson's disease: Secondary | ICD-10-CM | POA: Diagnosis not present

## 2020-09-20 DIAGNOSIS — E785 Hyperlipidemia, unspecified: Secondary | ICD-10-CM | POA: Diagnosis not present

## 2020-09-20 DIAGNOSIS — Z87891 Personal history of nicotine dependence: Secondary | ICD-10-CM | POA: Diagnosis not present

## 2020-09-20 DIAGNOSIS — Z7984 Long term (current) use of oral hypoglycemic drugs: Secondary | ICD-10-CM | POA: Diagnosis not present

## 2020-09-20 DIAGNOSIS — G2581 Restless legs syndrome: Secondary | ICD-10-CM | POA: Diagnosis not present

## 2020-09-20 DIAGNOSIS — Z79899 Other long term (current) drug therapy: Secondary | ICD-10-CM | POA: Diagnosis not present

## 2020-09-20 DIAGNOSIS — Z96641 Presence of right artificial hip joint: Secondary | ICD-10-CM | POA: Diagnosis not present

## 2020-09-20 DIAGNOSIS — Z8673 Personal history of transient ischemic attack (TIA), and cerebral infarction without residual deficits: Secondary | ICD-10-CM | POA: Diagnosis not present

## 2020-09-20 DIAGNOSIS — I771 Stricture of artery: Secondary | ICD-10-CM | POA: Diagnosis not present

## 2020-09-20 DIAGNOSIS — E1151 Type 2 diabetes mellitus with diabetic peripheral angiopathy without gangrene: Secondary | ICD-10-CM | POA: Diagnosis not present

## 2020-09-20 DIAGNOSIS — Z9181 History of falling: Secondary | ICD-10-CM | POA: Diagnosis not present

## 2020-09-21 DIAGNOSIS — Z96641 Presence of right artificial hip joint: Secondary | ICD-10-CM | POA: Diagnosis not present

## 2020-09-21 DIAGNOSIS — I771 Stricture of artery: Secondary | ICD-10-CM | POA: Diagnosis not present

## 2020-09-21 DIAGNOSIS — Z87891 Personal history of nicotine dependence: Secondary | ICD-10-CM | POA: Diagnosis not present

## 2020-09-21 DIAGNOSIS — E785 Hyperlipidemia, unspecified: Secondary | ICD-10-CM | POA: Diagnosis not present

## 2020-09-21 DIAGNOSIS — Z7984 Long term (current) use of oral hypoglycemic drugs: Secondary | ICD-10-CM | POA: Diagnosis not present

## 2020-09-21 DIAGNOSIS — Z9181 History of falling: Secondary | ICD-10-CM | POA: Diagnosis not present

## 2020-09-21 DIAGNOSIS — G20 Parkinson's disease: Secondary | ICD-10-CM | POA: Diagnosis not present

## 2020-09-21 DIAGNOSIS — G2581 Restless legs syndrome: Secondary | ICD-10-CM | POA: Diagnosis not present

## 2020-09-21 DIAGNOSIS — E1151 Type 2 diabetes mellitus with diabetic peripheral angiopathy without gangrene: Secondary | ICD-10-CM | POA: Diagnosis not present

## 2020-09-21 DIAGNOSIS — Z8673 Personal history of transient ischemic attack (TIA), and cerebral infarction without residual deficits: Secondary | ICD-10-CM | POA: Diagnosis not present

## 2020-09-21 DIAGNOSIS — Z79899 Other long term (current) drug therapy: Secondary | ICD-10-CM | POA: Diagnosis not present

## 2020-09-22 DIAGNOSIS — E785 Hyperlipidemia, unspecified: Secondary | ICD-10-CM | POA: Diagnosis not present

## 2020-09-22 DIAGNOSIS — Z96641 Presence of right artificial hip joint: Secondary | ICD-10-CM | POA: Diagnosis not present

## 2020-09-22 DIAGNOSIS — Z79899 Other long term (current) drug therapy: Secondary | ICD-10-CM | POA: Diagnosis not present

## 2020-09-22 DIAGNOSIS — Z87891 Personal history of nicotine dependence: Secondary | ICD-10-CM | POA: Diagnosis not present

## 2020-09-22 DIAGNOSIS — Z7984 Long term (current) use of oral hypoglycemic drugs: Secondary | ICD-10-CM | POA: Diagnosis not present

## 2020-09-22 DIAGNOSIS — E1151 Type 2 diabetes mellitus with diabetic peripheral angiopathy without gangrene: Secondary | ICD-10-CM | POA: Diagnosis not present

## 2020-09-22 DIAGNOSIS — Z8673 Personal history of transient ischemic attack (TIA), and cerebral infarction without residual deficits: Secondary | ICD-10-CM | POA: Diagnosis not present

## 2020-09-22 DIAGNOSIS — I771 Stricture of artery: Secondary | ICD-10-CM | POA: Diagnosis not present

## 2020-09-22 DIAGNOSIS — Z9181 History of falling: Secondary | ICD-10-CM | POA: Diagnosis not present

## 2020-09-22 DIAGNOSIS — G2581 Restless legs syndrome: Secondary | ICD-10-CM | POA: Diagnosis not present

## 2020-09-22 DIAGNOSIS — G20 Parkinson's disease: Secondary | ICD-10-CM | POA: Diagnosis not present

## 2020-09-25 ENCOUNTER — Ambulatory Visit (INDEPENDENT_AMBULATORY_CARE_PROVIDER_SITE_OTHER): Payer: Medicare Other

## 2020-09-25 DIAGNOSIS — Z7984 Long term (current) use of oral hypoglycemic drugs: Secondary | ICD-10-CM | POA: Diagnosis not present

## 2020-09-25 DIAGNOSIS — Z9181 History of falling: Secondary | ICD-10-CM | POA: Diagnosis not present

## 2020-09-25 DIAGNOSIS — G20 Parkinson's disease: Secondary | ICD-10-CM | POA: Diagnosis not present

## 2020-09-25 DIAGNOSIS — E785 Hyperlipidemia, unspecified: Secondary | ICD-10-CM | POA: Diagnosis not present

## 2020-09-25 DIAGNOSIS — Z96641 Presence of right artificial hip joint: Secondary | ICD-10-CM | POA: Diagnosis not present

## 2020-09-25 DIAGNOSIS — G2581 Restless legs syndrome: Secondary | ICD-10-CM | POA: Diagnosis not present

## 2020-09-25 DIAGNOSIS — Z87891 Personal history of nicotine dependence: Secondary | ICD-10-CM | POA: Diagnosis not present

## 2020-09-25 DIAGNOSIS — I771 Stricture of artery: Secondary | ICD-10-CM | POA: Diagnosis not present

## 2020-09-25 DIAGNOSIS — E1151 Type 2 diabetes mellitus with diabetic peripheral angiopathy without gangrene: Secondary | ICD-10-CM | POA: Diagnosis not present

## 2020-09-25 DIAGNOSIS — Z79899 Other long term (current) drug therapy: Secondary | ICD-10-CM | POA: Diagnosis not present

## 2020-09-25 DIAGNOSIS — R55 Syncope and collapse: Secondary | ICD-10-CM

## 2020-09-25 DIAGNOSIS — Z8673 Personal history of transient ischemic attack (TIA), and cerebral infarction without residual deficits: Secondary | ICD-10-CM | POA: Diagnosis not present

## 2020-09-25 LAB — CUP PACEART REMOTE DEVICE CHECK
Date Time Interrogation Session: 20220627082641
Implantable Pulse Generator Implant Date: 20210830

## 2020-09-26 DIAGNOSIS — Z7984 Long term (current) use of oral hypoglycemic drugs: Secondary | ICD-10-CM | POA: Diagnosis not present

## 2020-09-26 DIAGNOSIS — Z87891 Personal history of nicotine dependence: Secondary | ICD-10-CM | POA: Diagnosis not present

## 2020-09-26 DIAGNOSIS — E785 Hyperlipidemia, unspecified: Secondary | ICD-10-CM | POA: Diagnosis not present

## 2020-09-26 DIAGNOSIS — Z96641 Presence of right artificial hip joint: Secondary | ICD-10-CM | POA: Diagnosis not present

## 2020-09-26 DIAGNOSIS — G20 Parkinson's disease: Secondary | ICD-10-CM | POA: Diagnosis not present

## 2020-09-26 DIAGNOSIS — Z9181 History of falling: Secondary | ICD-10-CM | POA: Diagnosis not present

## 2020-09-26 DIAGNOSIS — Z79899 Other long term (current) drug therapy: Secondary | ICD-10-CM | POA: Diagnosis not present

## 2020-09-26 DIAGNOSIS — Z8673 Personal history of transient ischemic attack (TIA), and cerebral infarction without residual deficits: Secondary | ICD-10-CM | POA: Diagnosis not present

## 2020-09-26 DIAGNOSIS — I771 Stricture of artery: Secondary | ICD-10-CM | POA: Diagnosis not present

## 2020-09-26 DIAGNOSIS — G2581 Restless legs syndrome: Secondary | ICD-10-CM | POA: Diagnosis not present

## 2020-09-26 DIAGNOSIS — E1151 Type 2 diabetes mellitus with diabetic peripheral angiopathy without gangrene: Secondary | ICD-10-CM | POA: Diagnosis not present

## 2020-09-27 DIAGNOSIS — Z79899 Other long term (current) drug therapy: Secondary | ICD-10-CM | POA: Diagnosis not present

## 2020-09-27 DIAGNOSIS — G2581 Restless legs syndrome: Secondary | ICD-10-CM | POA: Diagnosis not present

## 2020-09-27 DIAGNOSIS — Z87891 Personal history of nicotine dependence: Secondary | ICD-10-CM | POA: Diagnosis not present

## 2020-09-27 DIAGNOSIS — E1151 Type 2 diabetes mellitus with diabetic peripheral angiopathy without gangrene: Secondary | ICD-10-CM | POA: Diagnosis not present

## 2020-09-27 DIAGNOSIS — Z8673 Personal history of transient ischemic attack (TIA), and cerebral infarction without residual deficits: Secondary | ICD-10-CM | POA: Diagnosis not present

## 2020-09-27 DIAGNOSIS — I771 Stricture of artery: Secondary | ICD-10-CM | POA: Diagnosis not present

## 2020-09-27 DIAGNOSIS — E785 Hyperlipidemia, unspecified: Secondary | ICD-10-CM | POA: Diagnosis not present

## 2020-09-27 DIAGNOSIS — G20 Parkinson's disease: Secondary | ICD-10-CM | POA: Diagnosis not present

## 2020-09-27 DIAGNOSIS — Z96641 Presence of right artificial hip joint: Secondary | ICD-10-CM | POA: Diagnosis not present

## 2020-09-27 DIAGNOSIS — Z9181 History of falling: Secondary | ICD-10-CM | POA: Diagnosis not present

## 2020-09-27 DIAGNOSIS — Z7984 Long term (current) use of oral hypoglycemic drugs: Secondary | ICD-10-CM | POA: Diagnosis not present

## 2020-09-28 ENCOUNTER — Other Ambulatory Visit: Payer: Self-pay

## 2020-09-28 MED ORDER — CARBIDOPA-LEVODOPA ER 50-200 MG PO TBCR: 1.0000 | EXTENDED_RELEASE_TABLET | Freq: Every day | ORAL | 1 refills | Status: DC

## 2020-09-29 DIAGNOSIS — Z96641 Presence of right artificial hip joint: Secondary | ICD-10-CM | POA: Diagnosis not present

## 2020-09-29 DIAGNOSIS — Z8673 Personal history of transient ischemic attack (TIA), and cerebral infarction without residual deficits: Secondary | ICD-10-CM | POA: Diagnosis not present

## 2020-09-29 DIAGNOSIS — E785 Hyperlipidemia, unspecified: Secondary | ICD-10-CM | POA: Diagnosis not present

## 2020-09-29 DIAGNOSIS — G2581 Restless legs syndrome: Secondary | ICD-10-CM | POA: Diagnosis not present

## 2020-09-29 DIAGNOSIS — G20 Parkinson's disease: Secondary | ICD-10-CM | POA: Diagnosis not present

## 2020-09-29 DIAGNOSIS — Z9181 History of falling: Secondary | ICD-10-CM | POA: Diagnosis not present

## 2020-09-29 DIAGNOSIS — Z7984 Long term (current) use of oral hypoglycemic drugs: Secondary | ICD-10-CM | POA: Diagnosis not present

## 2020-09-29 DIAGNOSIS — I771 Stricture of artery: Secondary | ICD-10-CM | POA: Diagnosis not present

## 2020-09-29 DIAGNOSIS — Z87891 Personal history of nicotine dependence: Secondary | ICD-10-CM | POA: Diagnosis not present

## 2020-09-29 DIAGNOSIS — Z79899 Other long term (current) drug therapy: Secondary | ICD-10-CM | POA: Diagnosis not present

## 2020-09-29 DIAGNOSIS — E1151 Type 2 diabetes mellitus with diabetic peripheral angiopathy without gangrene: Secondary | ICD-10-CM | POA: Diagnosis not present

## 2020-10-02 DIAGNOSIS — S7291XA Unspecified fracture of right femur, initial encounter for closed fracture: Secondary | ICD-10-CM | POA: Diagnosis not present

## 2020-10-02 DIAGNOSIS — S72001S Fracture of unspecified part of neck of right femur, sequela: Secondary | ICD-10-CM | POA: Diagnosis not present

## 2020-10-02 DIAGNOSIS — I739 Peripheral vascular disease, unspecified: Secondary | ICD-10-CM | POA: Diagnosis not present

## 2020-10-02 DIAGNOSIS — G20 Parkinson's disease: Secondary | ICD-10-CM | POA: Diagnosis not present

## 2020-10-03 ENCOUNTER — Other Ambulatory Visit: Payer: Medicare Other | Admitting: Hospice

## 2020-10-03 ENCOUNTER — Other Ambulatory Visit: Payer: Self-pay

## 2020-10-03 DIAGNOSIS — Z87891 Personal history of nicotine dependence: Secondary | ICD-10-CM | POA: Diagnosis not present

## 2020-10-03 DIAGNOSIS — G20 Parkinson's disease: Secondary | ICD-10-CM | POA: Diagnosis not present

## 2020-10-03 DIAGNOSIS — I771 Stricture of artery: Secondary | ICD-10-CM | POA: Diagnosis not present

## 2020-10-03 DIAGNOSIS — E1151 Type 2 diabetes mellitus with diabetic peripheral angiopathy without gangrene: Secondary | ICD-10-CM | POA: Diagnosis not present

## 2020-10-03 DIAGNOSIS — Z515 Encounter for palliative care: Secondary | ICD-10-CM | POA: Diagnosis not present

## 2020-10-03 DIAGNOSIS — F03911 Unspecified dementia, unspecified severity, with agitation: Secondary | ICD-10-CM

## 2020-10-03 DIAGNOSIS — Z7984 Long term (current) use of oral hypoglycemic drugs: Secondary | ICD-10-CM | POA: Diagnosis not present

## 2020-10-03 DIAGNOSIS — F0391 Unspecified dementia with behavioral disturbance: Secondary | ICD-10-CM

## 2020-10-03 DIAGNOSIS — Z8673 Personal history of transient ischemic attack (TIA), and cerebral infarction without residual deficits: Secondary | ICD-10-CM | POA: Diagnosis not present

## 2020-10-03 DIAGNOSIS — Z96641 Presence of right artificial hip joint: Secondary | ICD-10-CM | POA: Diagnosis not present

## 2020-10-03 DIAGNOSIS — Z79899 Other long term (current) drug therapy: Secondary | ICD-10-CM | POA: Diagnosis not present

## 2020-10-03 DIAGNOSIS — E785 Hyperlipidemia, unspecified: Secondary | ICD-10-CM | POA: Diagnosis not present

## 2020-10-03 DIAGNOSIS — Z9181 History of falling: Secondary | ICD-10-CM | POA: Diagnosis not present

## 2020-10-03 DIAGNOSIS — G2581 Restless legs syndrome: Secondary | ICD-10-CM | POA: Diagnosis not present

## 2020-10-03 NOTE — Progress Notes (Signed)
[facility] Consult Note Telephone: [phone]  Fax: [phone]  PATIENT NAME: Rodney Love DOB: Mar 25, 1946 MRN: [id number]  PRIMARY CARE PROVIDER:   Lujean Amel, MD Lujean Amel, [address],  [place] [address]  REFERRING PROVIDER: Lujean Amel, MD Lujean Amel, [address] [place],  [place] [address]  RESPONSIBLE PARTY:  Spouse [phone] h Contact Information     Name Relation Home Work Mobile   [place] Spouse [phone]  [phone]   D[place] Breeding Daughter [phone]  [phone]   [name] Daughter [phone]        Visit is to build trust and highlight Palliative Medicine as specialized medical care for people living with serious illness, aimed at facilitating better quality of life through symptoms relief, assisting with advance care planning and complex medical decision making. NP called [name] and updated her on visit. Spouse present with patient during visit. This is a follow up visit.  RECOMMENDATIONS/PLAN:   Advance Care Planning/Code Status: Patient is a Full code  Goals of Care: Goals of care include to maximize quality of life and symptom management.  Visit consisted of counseling and education dealing with the complex and emotionally intense issues of symptom management and palliative care in the setting of serious and potentially life-threatening illness. Palliative care team will continue to support patient, patient's family, and medical team.  Symptom management/Plan:  Agitation: related to advanced Dementia. Agitation and occasional hallucination Recommendation: Seroquel 25 mg at bedtime; increase to 25mg  BID if needed.  Dementia:Fast 7C, FLACC 0.  PPS currently 30%, bedbound, sleeping up to 18 hours a day, incontinent of bowel and bladder and not able to engage in sustained meaningful conversation. Ongoing  education on dementia as a terminal disease; discussed disease trajectory and what to expect.   OT ST following patient. Continue ongoing supportive care.  Routine CBC BMP. Parkinson disease: Continue Sinemet as ordered by neurologist.  Neurologist consult as planned/needed Dizziness: Recommendation: Meclizine 25mg  BID prn dizziness.  Fall precautions emphasized. Follow up: Palliative care will continue to follow for complex medical decision making, advance care planning, and clarification of goals. Return 6 weeks or prn. Encouraged to call provider sooner with any concerns.   CHIEF COMPLAINT: Palliative follow up visit/weakness   HISTORY OF PRESENT ILLNESS:  Rodney Love a 75 y.o. male with multiple medical problems including worsening advanced Dementia with associated agitation and hallucination which make him resist care impairing the quality of his life. Agitation is worse at evening time and when spouse wants to help with his ADLs. Distraction and persuasion are sometimes helpful. 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 History of Parkinson disease, advanced dementia, CAD, hypertension, syncope. History obtained from review of EMR, discussion with primary team, family and/or patient. Records reviewed and summarized above. All 10 point systems reviewed and are negative except as documented in history of present illness above  Review and summarization of Epic records shows history from other than patient.   Palliative Care was asked to follow this patient o help address complex decision making in the context of advance care planning and goals of care clarification.   PHYSICAL EXAM  General: In no acute distress, appropriately dressed, sleeping most of visit Cardiovascular: regular rate and rhythm; no edema in BLE Pulmonary: no cough, no increased work of breathing, normal respiratory effort Abdomen: soft, non tender, no guarding, positive bowel sounds in all quadrants GU:  no suprapubic tenderness Eyes: Normal lids, no discharge ENMT: Moist mucous membranes Musculoskeletal:  weakness, bed bound Skin: no rash to visible skin, warm without cyanosis,  Psych: non-anxious affect Neurological: Weakness but otherwise non focal Heme/lymph/immuno: no bruises, no bleeding  PERTINENT MEDICATIONS:  Outpatient Encounter Medications as of 10/03/2020  Medication Sig   acetaminophen (TYLENOL) 500 MG tablet Take 500 mg by mouth every 6 (six) hours as needed for moderate pain.   atorvastatin (LIPITOR) 20 MG tablet Take 20 mg by mouth daily.    carbidopa-levodopa (SINEMET CR) 50-200 MG tablet Take 1 tablet by mouth at bedtime.   Carbidopa-Levodopa ER (SINEMET CR) 25-100 MG tablet controlled release Take 1 tablet by mouth in the morning, at noon, in the evening, and at bedtime. 7am/10am/1pm/4pm   cholecalciferol (VITAMIN D3) 25 MCG (1000 UNIT) tablet Take 1,000 Units by mouth daily.   Cyanocobalamin (VITAMIN B 12 PO) Take 1 tablet by mouth daily.   ferrous sulfate 324 MG TBEC Take 324 mg by mouth daily at 6 (six) AM.   metFORMIN (GLUCOPHAGE) 850 MG tablet Take 850 mg by mouth 3 (three) times a week. Mon/Wed/Fri   ONETOUCH ULTRA test strip    senna (SENOKOT) 8.6 MG TABS tablet Take 1 tablet (8.6 mg total) by mouth 2 (two) times daily.   [DISCONTINUED] pramipexole (MIRAPEX) 0.125 MG tablet Take 0.125 mg by mouth at bedtime.   Facility-Administered Encounter Medications as of 10/03/2020  Medication   lidocaine-EPINEPHrine (XYLOCAINE W/EPI) 1 %-1:100000 (with pres) injection 10 mL    HOSPICE ELIGIBILITY/DIAGNOSIS: TBD  PAST MEDICAL HISTORY:  Past Medical History:  Diagnosis Date   Arthritis    Constipation    OCCASIONAL   Coronary artery disease    Diabetes mellitus without complication ([facility])    Environmental allergies    Hyperlipidemia    Hypertension    Inguinal hernia    Iron deficiency    Nocturia    Parkinson disease ([facility])    Peripheral arterial disease ([facility])       ALLERGIES: No Known Allergies    I spent 60 minutes providing this consultation; this includes time spent with patient/family, chart review and documentation. More than 50% of the time in this consultation was spent on counseling and coordinating communication   Thank you for the opportunity to participate in the care of TIRON SUSKI Please call our office at [phone] if we can be of additional assistance.  Note: Portions of this note were generated with Lobbyist. Dictation errors may occur despite best attempts at proofreading.  Teodoro Spray, NP

## 2020-10-06 DIAGNOSIS — Z9181 History of falling: Secondary | ICD-10-CM | POA: Diagnosis not present

## 2020-10-06 DIAGNOSIS — Z8673 Personal history of transient ischemic attack (TIA), and cerebral infarction without residual deficits: Secondary | ICD-10-CM | POA: Diagnosis not present

## 2020-10-06 DIAGNOSIS — Z79899 Other long term (current) drug therapy: Secondary | ICD-10-CM | POA: Diagnosis not present

## 2020-10-06 DIAGNOSIS — Z87891 Personal history of nicotine dependence: Secondary | ICD-10-CM | POA: Diagnosis not present

## 2020-10-06 DIAGNOSIS — G2581 Restless legs syndrome: Secondary | ICD-10-CM | POA: Diagnosis not present

## 2020-10-06 DIAGNOSIS — E1151 Type 2 diabetes mellitus with diabetic peripheral angiopathy without gangrene: Secondary | ICD-10-CM | POA: Diagnosis not present

## 2020-10-06 DIAGNOSIS — Z7984 Long term (current) use of oral hypoglycemic drugs: Secondary | ICD-10-CM | POA: Diagnosis not present

## 2020-10-06 DIAGNOSIS — I771 Stricture of artery: Secondary | ICD-10-CM | POA: Diagnosis not present

## 2020-10-06 DIAGNOSIS — E785 Hyperlipidemia, unspecified: Secondary | ICD-10-CM | POA: Diagnosis not present

## 2020-10-06 DIAGNOSIS — G20 Parkinson's disease: Secondary | ICD-10-CM | POA: Diagnosis not present

## 2020-10-06 DIAGNOSIS — Z96641 Presence of right artificial hip joint: Secondary | ICD-10-CM | POA: Diagnosis not present

## 2020-10-07 DIAGNOSIS — Z9181 History of falling: Secondary | ICD-10-CM | POA: Diagnosis not present

## 2020-10-07 DIAGNOSIS — Z79899 Other long term (current) drug therapy: Secondary | ICD-10-CM | POA: Diagnosis not present

## 2020-10-07 DIAGNOSIS — G20 Parkinson's disease: Secondary | ICD-10-CM | POA: Diagnosis not present

## 2020-10-07 DIAGNOSIS — I771 Stricture of artery: Secondary | ICD-10-CM | POA: Diagnosis not present

## 2020-10-07 DIAGNOSIS — Z8673 Personal history of transient ischemic attack (TIA), and cerebral infarction without residual deficits: Secondary | ICD-10-CM | POA: Diagnosis not present

## 2020-10-07 DIAGNOSIS — Z87891 Personal history of nicotine dependence: Secondary | ICD-10-CM | POA: Diagnosis not present

## 2020-10-07 DIAGNOSIS — G2581 Restless legs syndrome: Secondary | ICD-10-CM | POA: Diagnosis not present

## 2020-10-07 DIAGNOSIS — E1151 Type 2 diabetes mellitus with diabetic peripheral angiopathy without gangrene: Secondary | ICD-10-CM | POA: Diagnosis not present

## 2020-10-07 DIAGNOSIS — Z7984 Long term (current) use of oral hypoglycemic drugs: Secondary | ICD-10-CM | POA: Diagnosis not present

## 2020-10-07 DIAGNOSIS — E785 Hyperlipidemia, unspecified: Secondary | ICD-10-CM | POA: Diagnosis not present

## 2020-10-07 DIAGNOSIS — Z96641 Presence of right artificial hip joint: Secondary | ICD-10-CM | POA: Diagnosis not present

## 2020-10-10 DIAGNOSIS — Z96641 Presence of right artificial hip joint: Secondary | ICD-10-CM | POA: Diagnosis not present

## 2020-10-10 DIAGNOSIS — R5383 Other fatigue: Secondary | ICD-10-CM | POA: Diagnosis not present

## 2020-10-10 DIAGNOSIS — I771 Stricture of artery: Secondary | ICD-10-CM | POA: Diagnosis not present

## 2020-10-10 DIAGNOSIS — G2581 Restless legs syndrome: Secondary | ICD-10-CM | POA: Diagnosis not present

## 2020-10-10 DIAGNOSIS — Z7984 Long term (current) use of oral hypoglycemic drugs: Secondary | ICD-10-CM | POA: Diagnosis not present

## 2020-10-10 DIAGNOSIS — E1169 Type 2 diabetes mellitus with other specified complication: Secondary | ICD-10-CM | POA: Diagnosis not present

## 2020-10-10 DIAGNOSIS — G20 Parkinson's disease: Secondary | ICD-10-CM | POA: Diagnosis not present

## 2020-10-10 DIAGNOSIS — E785 Hyperlipidemia, unspecified: Secondary | ICD-10-CM | POA: Diagnosis not present

## 2020-10-10 DIAGNOSIS — Z9181 History of falling: Secondary | ICD-10-CM | POA: Diagnosis not present

## 2020-10-10 DIAGNOSIS — Z8673 Personal history of transient ischemic attack (TIA), and cerebral infarction without residual deficits: Secondary | ICD-10-CM | POA: Diagnosis not present

## 2020-10-10 DIAGNOSIS — Z79899 Other long term (current) drug therapy: Secondary | ICD-10-CM | POA: Diagnosis not present

## 2020-10-10 DIAGNOSIS — Z87891 Personal history of nicotine dependence: Secondary | ICD-10-CM | POA: Diagnosis not present

## 2020-10-10 DIAGNOSIS — E1151 Type 2 diabetes mellitus with diabetic peripheral angiopathy without gangrene: Secondary | ICD-10-CM | POA: Diagnosis not present

## 2020-10-11 DIAGNOSIS — Z7984 Long term (current) use of oral hypoglycemic drugs: Secondary | ICD-10-CM | POA: Diagnosis not present

## 2020-10-11 DIAGNOSIS — I771 Stricture of artery: Secondary | ICD-10-CM | POA: Diagnosis not present

## 2020-10-11 DIAGNOSIS — Z8673 Personal history of transient ischemic attack (TIA), and cerebral infarction without residual deficits: Secondary | ICD-10-CM | POA: Diagnosis not present

## 2020-10-11 DIAGNOSIS — Z79899 Other long term (current) drug therapy: Secondary | ICD-10-CM | POA: Diagnosis not present

## 2020-10-11 DIAGNOSIS — E1151 Type 2 diabetes mellitus with diabetic peripheral angiopathy without gangrene: Secondary | ICD-10-CM | POA: Diagnosis not present

## 2020-10-11 DIAGNOSIS — Z96641 Presence of right artificial hip joint: Secondary | ICD-10-CM | POA: Diagnosis not present

## 2020-10-11 DIAGNOSIS — G2581 Restless legs syndrome: Secondary | ICD-10-CM | POA: Diagnosis not present

## 2020-10-11 DIAGNOSIS — Z9181 History of falling: Secondary | ICD-10-CM | POA: Diagnosis not present

## 2020-10-11 DIAGNOSIS — E785 Hyperlipidemia, unspecified: Secondary | ICD-10-CM | POA: Diagnosis not present

## 2020-10-11 DIAGNOSIS — Z87891 Personal history of nicotine dependence: Secondary | ICD-10-CM | POA: Diagnosis not present

## 2020-10-11 DIAGNOSIS — G20 Parkinson's disease: Secondary | ICD-10-CM | POA: Diagnosis not present

## 2020-10-11 NOTE — Progress Notes (Signed)
Carelink Summary Report / Loop Recorder 

## 2020-10-17 ENCOUNTER — Ambulatory Visit (HOSPITAL_COMMUNITY)
Admission: RE | Admit: 2020-10-17 | Payer: Medicare Other | Source: Ambulatory Visit | Attending: Cardiovascular Disease | Admitting: Cardiovascular Disease

## 2020-10-17 DIAGNOSIS — G2581 Restless legs syndrome: Secondary | ICD-10-CM | POA: Diagnosis not present

## 2020-10-17 DIAGNOSIS — E785 Hyperlipidemia, unspecified: Secondary | ICD-10-CM | POA: Diagnosis not present

## 2020-10-17 DIAGNOSIS — Z79899 Other long term (current) drug therapy: Secondary | ICD-10-CM | POA: Diagnosis not present

## 2020-10-17 DIAGNOSIS — Z96641 Presence of right artificial hip joint: Secondary | ICD-10-CM | POA: Diagnosis not present

## 2020-10-17 DIAGNOSIS — Z8673 Personal history of transient ischemic attack (TIA), and cerebral infarction without residual deficits: Secondary | ICD-10-CM | POA: Diagnosis not present

## 2020-10-17 DIAGNOSIS — G20 Parkinson's disease: Secondary | ICD-10-CM | POA: Diagnosis not present

## 2020-10-17 DIAGNOSIS — Z9181 History of falling: Secondary | ICD-10-CM | POA: Diagnosis not present

## 2020-10-17 DIAGNOSIS — Z7984 Long term (current) use of oral hypoglycemic drugs: Secondary | ICD-10-CM | POA: Diagnosis not present

## 2020-10-17 DIAGNOSIS — Z87891 Personal history of nicotine dependence: Secondary | ICD-10-CM | POA: Diagnosis not present

## 2020-10-17 DIAGNOSIS — E1151 Type 2 diabetes mellitus with diabetic peripheral angiopathy without gangrene: Secondary | ICD-10-CM | POA: Diagnosis not present

## 2020-10-17 DIAGNOSIS — I6521 Occlusion and stenosis of right carotid artery: Secondary | ICD-10-CM

## 2020-10-17 DIAGNOSIS — I771 Stricture of artery: Secondary | ICD-10-CM | POA: Diagnosis not present

## 2020-10-18 DIAGNOSIS — Z9181 History of falling: Secondary | ICD-10-CM | POA: Diagnosis not present

## 2020-10-18 DIAGNOSIS — G2581 Restless legs syndrome: Secondary | ICD-10-CM | POA: Diagnosis not present

## 2020-10-18 DIAGNOSIS — I771 Stricture of artery: Secondary | ICD-10-CM | POA: Diagnosis not present

## 2020-10-18 DIAGNOSIS — E785 Hyperlipidemia, unspecified: Secondary | ICD-10-CM | POA: Diagnosis not present

## 2020-10-18 DIAGNOSIS — Z87891 Personal history of nicotine dependence: Secondary | ICD-10-CM | POA: Diagnosis not present

## 2020-10-18 DIAGNOSIS — E1151 Type 2 diabetes mellitus with diabetic peripheral angiopathy without gangrene: Secondary | ICD-10-CM | POA: Diagnosis not present

## 2020-10-18 DIAGNOSIS — Z8673 Personal history of transient ischemic attack (TIA), and cerebral infarction without residual deficits: Secondary | ICD-10-CM | POA: Diagnosis not present

## 2020-10-18 DIAGNOSIS — Z96641 Presence of right artificial hip joint: Secondary | ICD-10-CM | POA: Diagnosis not present

## 2020-10-18 DIAGNOSIS — Z79899 Other long term (current) drug therapy: Secondary | ICD-10-CM | POA: Diagnosis not present

## 2020-10-18 DIAGNOSIS — G20 Parkinson's disease: Secondary | ICD-10-CM | POA: Diagnosis not present

## 2020-10-18 DIAGNOSIS — Z7984 Long term (current) use of oral hypoglycemic drugs: Secondary | ICD-10-CM | POA: Diagnosis not present

## 2020-10-23 DIAGNOSIS — E1151 Type 2 diabetes mellitus with diabetic peripheral angiopathy without gangrene: Secondary | ICD-10-CM | POA: Diagnosis not present

## 2020-10-23 DIAGNOSIS — I771 Stricture of artery: Secondary | ICD-10-CM | POA: Diagnosis not present

## 2020-10-23 DIAGNOSIS — Z7984 Long term (current) use of oral hypoglycemic drugs: Secondary | ICD-10-CM | POA: Diagnosis not present

## 2020-10-23 DIAGNOSIS — G20 Parkinson's disease: Secondary | ICD-10-CM | POA: Diagnosis not present

## 2020-10-23 DIAGNOSIS — Z79899 Other long term (current) drug therapy: Secondary | ICD-10-CM | POA: Diagnosis not present

## 2020-10-23 DIAGNOSIS — G2581 Restless legs syndrome: Secondary | ICD-10-CM | POA: Diagnosis not present

## 2020-10-23 DIAGNOSIS — E785 Hyperlipidemia, unspecified: Secondary | ICD-10-CM | POA: Diagnosis not present

## 2020-10-23 DIAGNOSIS — Z87891 Personal history of nicotine dependence: Secondary | ICD-10-CM | POA: Diagnosis not present

## 2020-10-23 DIAGNOSIS — Z96641 Presence of right artificial hip joint: Secondary | ICD-10-CM | POA: Diagnosis not present

## 2020-10-23 DIAGNOSIS — Z9181 History of falling: Secondary | ICD-10-CM | POA: Diagnosis not present

## 2020-10-23 DIAGNOSIS — Z8673 Personal history of transient ischemic attack (TIA), and cerebral infarction without residual deficits: Secondary | ICD-10-CM | POA: Diagnosis not present

## 2020-10-25 DIAGNOSIS — G20 Parkinson's disease: Secondary | ICD-10-CM | POA: Diagnosis not present

## 2020-10-25 DIAGNOSIS — Z9181 History of falling: Secondary | ICD-10-CM | POA: Diagnosis not present

## 2020-10-25 DIAGNOSIS — Z79899 Other long term (current) drug therapy: Secondary | ICD-10-CM | POA: Diagnosis not present

## 2020-10-25 DIAGNOSIS — G2581 Restless legs syndrome: Secondary | ICD-10-CM | POA: Diagnosis not present

## 2020-10-25 DIAGNOSIS — E785 Hyperlipidemia, unspecified: Secondary | ICD-10-CM | POA: Diagnosis not present

## 2020-10-25 DIAGNOSIS — Z96641 Presence of right artificial hip joint: Secondary | ICD-10-CM | POA: Diagnosis not present

## 2020-10-25 DIAGNOSIS — Z7984 Long term (current) use of oral hypoglycemic drugs: Secondary | ICD-10-CM | POA: Diagnosis not present

## 2020-10-25 DIAGNOSIS — Z8673 Personal history of transient ischemic attack (TIA), and cerebral infarction without residual deficits: Secondary | ICD-10-CM | POA: Diagnosis not present

## 2020-10-25 DIAGNOSIS — Z87891 Personal history of nicotine dependence: Secondary | ICD-10-CM | POA: Diagnosis not present

## 2020-10-25 DIAGNOSIS — I771 Stricture of artery: Secondary | ICD-10-CM | POA: Diagnosis not present

## 2020-10-25 DIAGNOSIS — E1151 Type 2 diabetes mellitus with diabetic peripheral angiopathy without gangrene: Secondary | ICD-10-CM | POA: Diagnosis not present

## 2020-10-26 ENCOUNTER — Ambulatory Visit (INDEPENDENT_AMBULATORY_CARE_PROVIDER_SITE_OTHER): Payer: Medicare Other

## 2020-10-26 DIAGNOSIS — R55 Syncope and collapse: Secondary | ICD-10-CM | POA: Diagnosis not present

## 2020-10-26 LAB — CUP PACEART REMOTE DEVICE CHECK
Date Time Interrogation Session: 20220728000005
Implantable Pulse Generator Implant Date: 20210830

## 2020-11-01 DIAGNOSIS — I771 Stricture of artery: Secondary | ICD-10-CM | POA: Diagnosis not present

## 2020-11-01 DIAGNOSIS — Z8673 Personal history of transient ischemic attack (TIA), and cerebral infarction without residual deficits: Secondary | ICD-10-CM | POA: Diagnosis not present

## 2020-11-01 DIAGNOSIS — E1151 Type 2 diabetes mellitus with diabetic peripheral angiopathy without gangrene: Secondary | ICD-10-CM | POA: Diagnosis not present

## 2020-11-01 DIAGNOSIS — Z7984 Long term (current) use of oral hypoglycemic drugs: Secondary | ICD-10-CM | POA: Diagnosis not present

## 2020-11-01 DIAGNOSIS — Z96641 Presence of right artificial hip joint: Secondary | ICD-10-CM | POA: Diagnosis not present

## 2020-11-01 DIAGNOSIS — Z87891 Personal history of nicotine dependence: Secondary | ICD-10-CM | POA: Diagnosis not present

## 2020-11-01 DIAGNOSIS — G2581 Restless legs syndrome: Secondary | ICD-10-CM | POA: Diagnosis not present

## 2020-11-01 DIAGNOSIS — Z79899 Other long term (current) drug therapy: Secondary | ICD-10-CM | POA: Diagnosis not present

## 2020-11-01 DIAGNOSIS — E785 Hyperlipidemia, unspecified: Secondary | ICD-10-CM | POA: Diagnosis not present

## 2020-11-01 DIAGNOSIS — Z9181 History of falling: Secondary | ICD-10-CM | POA: Diagnosis not present

## 2020-11-01 DIAGNOSIS — G20 Parkinson's disease: Secondary | ICD-10-CM | POA: Diagnosis not present

## 2020-11-02 DIAGNOSIS — I952 Hypotension due to drugs: Secondary | ICD-10-CM | POA: Diagnosis not present

## 2020-11-02 DIAGNOSIS — S7291XA Unspecified fracture of right femur, initial encounter for closed fracture: Secondary | ICD-10-CM | POA: Diagnosis not present

## 2020-11-02 DIAGNOSIS — G20 Parkinson's disease: Secondary | ICD-10-CM | POA: Diagnosis not present

## 2020-11-02 DIAGNOSIS — I739 Peripheral vascular disease, unspecified: Secondary | ICD-10-CM | POA: Diagnosis not present

## 2020-11-08 ENCOUNTER — Other Ambulatory Visit: Payer: Medicare Other | Admitting: Hospice

## 2020-11-08 ENCOUNTER — Other Ambulatory Visit: Payer: Self-pay

## 2020-11-08 DIAGNOSIS — Z515 Encounter for palliative care: Secondary | ICD-10-CM

## 2020-11-08 DIAGNOSIS — R531 Weakness: Secondary | ICD-10-CM | POA: Diagnosis not present

## 2020-11-08 DIAGNOSIS — G20A1 Parkinson's disease without dyskinesia, without mention of fluctuations: Secondary | ICD-10-CM

## 2020-11-08 DIAGNOSIS — F0390 Unspecified dementia without behavioral disturbance: Secondary | ICD-10-CM

## 2020-11-08 DIAGNOSIS — G20 Parkinson's disease: Secondary | ICD-10-CM | POA: Diagnosis not present

## 2020-11-08 NOTE — Progress Notes (Signed)
[facility] Consult Note Telephone: [phone]  Fax: [phone]  PATIENT NAME: Rodney Love DOB: [birth year] MRN: [id number]  PRIMARY CARE PROVIDER:   Lujean Amel, MD Lujean Amel, [address],  [place] [address]  REFERRING PROVIDER: Lujean Amel, MD Lujean Amel, [address] [place],  [place] [address]  RESPONSIBLE PARTY:  Spouse [phone] h Contact Information     Name Relation Home Work Mobile   [place] Spouse [phone]  [phone]   [place] Breeding Daughter [phone]  [phone]   [name] Daughter [phone]        Visit is to build trust and highlight Palliative Medicine as specialized medical care for people living with serious illness, aimed at facilitating better quality of life through symptoms relief, assisting with advance care planning and complex medical decision making. Spouse, [name] and [name] are present with patient during visit. This is a follow up visit.  RECOMMENDATIONS/PLAN:   Advance Care Planning/Code Status: Discussion on ramifications and implications of CODE STATUS.  Spouse affirmed that patient remains a full code.  She wants full scope of treatment; everything done to keep her spouse alive  Goals of Care: Goals of care include to maximize quality of life and symptom management.  Family considering hospice service while choosing full scope of treatment and doing everything to keep patient alive.  Education provided that hospice service is dedicated to patients with 6 months or less and electing comfort measures.  Family was assured that anytime they decide on hospice service, NP Love facilitate a smooth transition.  Visit consisted of counseling and education dealing with the complex and emotionally intense issues of symptom management and palliative care in the setting of serious and potentially  life-threatening illness. Palliative care team Love continue to support patient, patient's family, and medical team.  Symptom management/Plan:  Dementia: Advanced, bedbound FAST 7C.  Continue supportive ongoing care. Weakness: [name] reports PT/OT signed off patient yesterday due to no progress despite treatment.  Continue range of motion exercises/physical activity as tolerated to optimize wellbeing. Parkinson disease: Continue Sinemet as ordered.  Followed by neurologist. Follow up: Palliative care Love continue to follow for complex medical decision making, advance care planning, and clarification of goals. Return 6 weeks or prn. Encouraged to call provider sooner with any concerns.  CHIEF COMPLAINT: Palliative follow up  HISTORY OF PRESENT ILLNESS:  Rodney Love a 75 y.o. male with multiple medical problems including advanced dementia; no report of recent hallucination.  History of Parkinson disease, CAD, hypertension, syncope.  Patient denies pain/discomfort; endorsed ongoing weakness. FLACC 0. History obtained from review of EMR, discussion with primary team, family and/or patient. Records reviewed and summarized above. All 10 point systems reviewed and are negative except as documented in history of present illness above  Review and summarization of Epic records shows history from other than patient.   Palliative Care was asked to follow this patient o help address complex decision making in the context of advance care planning and goals of care clarification.   PHYSICAL EXAM  General: In no acute distress, appropriately dressed Cardiovascular: regular rate and rhythm; no edema in BLE Pulmonary: no cough, no increased work of breathing, normal respiratory effort Abdomen: soft, non tender, no guarding, positive bowel sounds in all quadrants GU:  no suprapubic tenderness Eyes: Normal lids, no discharge ENMT: Moist mucous membranes Musculoskeletal:  weakness, sarcopenia Skin: no rash to  visible skin, warm  without cyanosis,  Psych: non-anxious affect Neurological: Weakness but otherwise non focal Heme/lymph/immuno: no bruises, no bleeding  PERTINENT MEDICATIONS:  Outpatient Encounter Medications as of 11/08/2020  Medication Sig   acetaminophen (TYLENOL) 500 MG tablet Take 500 mg by mouth every 6 (six) hours as needed for moderate pain.   atorvastatin (LIPITOR) 20 MG tablet Take 20 mg by mouth daily.    carbidopa-levodopa (SINEMET CR) 50-200 MG tablet Take 1 tablet by mouth at bedtime.   Carbidopa-Levodopa ER (SINEMET CR) 25-100 MG tablet controlled release Take 1 tablet by mouth in the morning, at noon, in the evening, and at bedtime. 7am/10am/1pm/4pm   cholecalciferol (VITAMIN D3) 25 MCG (1000 UNIT) tablet Take 1,000 Units by mouth daily.   Cyanocobalamin (VITAMIN B 12 PO) Take 1 tablet by mouth daily.   ferrous sulfate 324 MG TBEC Take 324 mg by mouth daily at 6 (six) AM.   metFORMIN (GLUCOPHAGE) 850 MG tablet Take 850 mg by mouth 3 (three) times a week. Mon/Wed/Fri   ONETOUCH ULTRA test strip    senna (SENOKOT) 8.6 MG TABS tablet Take 1 tablet (8.6 mg total) by mouth 2 (two) times daily.   [DISCONTINUED] pramipexole (MIRAPEX) 0.125 MG tablet Take 0.125 mg by mouth at bedtime.   Facility-Administered Encounter Medications as of 11/08/2020  Medication   lidocaine-EPINEPHrine (XYLOCAINE W/EPI) 1 %-1:100000 (with pres) injection 10 mL    HOSPICE ELIGIBILITY/DIAGNOSIS: TBD  PAST MEDICAL HISTORY:  Past Medical History:  Diagnosis Date   Arthritis    Constipation    OCCASIONAL   Coronary artery disease    Diabetes mellitus without complication ([facility])    Environmental allergies    Hyperlipidemia    Hypertension    Inguinal hernia    Iron deficiency    Nocturia    Parkinson disease ([facility])    Peripheral arterial disease ([facility])     ALLERGIES: No Known Allergies    I spent 60 minutes providing this consultation; this includes time spent with patient/family, chart  review and documentation. More than 50% of the time in this consultation was spent on counseling and coordinating communication   Thank you for the opportunity to participate in the care of Rodney Love Please call our office at [phone] if we can be of additional assistance.  Note: Portions of this note were generated with Lobbyist. Dictation errors may occur despite best attempts at proofreading.  Teodoro Spray, NP

## 2020-11-21 NOTE — Progress Notes (Signed)
Carelink Summary Report / Loop Recorder 

## 2020-11-24 DIAGNOSIS — J0100 Acute maxillary sinusitis, unspecified: Secondary | ICD-10-CM | POA: Diagnosis not present

## 2020-11-28 ENCOUNTER — Ambulatory Visit (INDEPENDENT_AMBULATORY_CARE_PROVIDER_SITE_OTHER): Payer: Medicare Other

## 2020-11-28 DIAGNOSIS — R55 Syncope and collapse: Secondary | ICD-10-CM | POA: Diagnosis not present

## 2020-11-29 LAB — CUP PACEART REMOTE DEVICE CHECK
Date Time Interrogation Session: 20220830044624
Implantable Pulse Generator Implant Date: 20210830

## 2020-12-03 DIAGNOSIS — S72001D Fracture of unspecified part of neck of right femur, subsequent encounter for closed fracture with routine healing: Secondary | ICD-10-CM | POA: Diagnosis not present

## 2020-12-03 DIAGNOSIS — G20 Parkinson's disease: Secondary | ICD-10-CM | POA: Diagnosis not present

## 2020-12-03 DIAGNOSIS — I739 Peripheral vascular disease, unspecified: Secondary | ICD-10-CM | POA: Diagnosis not present

## 2020-12-03 DIAGNOSIS — S7291XA Unspecified fracture of right femur, initial encounter for closed fracture: Secondary | ICD-10-CM | POA: Diagnosis not present

## 2020-12-11 NOTE — Progress Notes (Signed)
Carelink Summary Report / Loop Recorder 

## 2020-12-13 ENCOUNTER — Telehealth: Payer: Self-pay | Admitting: Hospice

## 2020-12-13 NOTE — Telephone Encounter (Signed)
NP called [name] for scheduled visit, no response; left her a voicemail with call back number.

## 2021-01-01 ENCOUNTER — Ambulatory Visit (INDEPENDENT_AMBULATORY_CARE_PROVIDER_SITE_OTHER): Payer: Medicare Other

## 2021-01-01 DIAGNOSIS — R55 Syncope and collapse: Secondary | ICD-10-CM

## 2021-01-01 LAB — CUP PACEART REMOTE DEVICE CHECK
Date Time Interrogation Session: 20221002000220
Implantable Pulse Generator Implant Date: 20210830

## 2021-01-02 DIAGNOSIS — G20 Parkinson's disease: Secondary | ICD-10-CM | POA: Diagnosis not present

## 2021-01-02 DIAGNOSIS — S72001D Fracture of unspecified part of neck of right femur, subsequent encounter for closed fracture with routine healing: Secondary | ICD-10-CM | POA: Diagnosis not present

## 2021-01-02 DIAGNOSIS — I739 Peripheral vascular disease, unspecified: Secondary | ICD-10-CM | POA: Diagnosis not present

## 2021-01-02 DIAGNOSIS — S7291XA Unspecified fracture of right femur, initial encounter for closed fracture: Secondary | ICD-10-CM | POA: Diagnosis not present

## 2021-01-03 DIAGNOSIS — E039 Hypothyroidism, unspecified: Secondary | ICD-10-CM | POA: Diagnosis not present

## 2021-01-03 DIAGNOSIS — G20 Parkinson's disease: Secondary | ICD-10-CM | POA: Diagnosis not present

## 2021-01-03 DIAGNOSIS — R5383 Other fatigue: Secondary | ICD-10-CM | POA: Diagnosis not present

## 2021-01-03 DIAGNOSIS — Z79899 Other long term (current) drug therapy: Secondary | ICD-10-CM | POA: Diagnosis not present

## 2021-01-03 DIAGNOSIS — E1169 Type 2 diabetes mellitus with other specified complication: Secondary | ICD-10-CM | POA: Diagnosis not present

## 2021-01-03 DIAGNOSIS — R053 Chronic cough: Secondary | ICD-10-CM | POA: Diagnosis not present

## 2021-01-03 DIAGNOSIS — E7800 Pure hypercholesterolemia, unspecified: Secondary | ICD-10-CM | POA: Diagnosis not present

## 2021-01-03 DIAGNOSIS — Z23 Encounter for immunization: Secondary | ICD-10-CM | POA: Diagnosis not present

## 2021-01-03 DIAGNOSIS — I10 Essential (primary) hypertension: Secondary | ICD-10-CM | POA: Diagnosis not present

## 2021-01-08 NOTE — Progress Notes (Signed)
Carelink Summary Report / Loop Recorder 

## 2021-01-09 DIAGNOSIS — E039 Hypothyroidism, unspecified: Secondary | ICD-10-CM | POA: Diagnosis not present

## 2021-01-09 DIAGNOSIS — E1169 Type 2 diabetes mellitus with other specified complication: Secondary | ICD-10-CM | POA: Diagnosis not present

## 2021-01-09 DIAGNOSIS — I10 Essential (primary) hypertension: Secondary | ICD-10-CM | POA: Diagnosis not present

## 2021-01-09 DIAGNOSIS — G20 Parkinson's disease: Secondary | ICD-10-CM | POA: Diagnosis not present

## 2021-01-09 DIAGNOSIS — E7800 Pure hypercholesterolemia, unspecified: Secondary | ICD-10-CM | POA: Diagnosis not present

## 2021-01-26 ENCOUNTER — Telehealth: Payer: Self-pay

## 2021-01-26 NOTE — Telephone Encounter (Signed)
Carelink alert received-  Daily carelink alert received for tachy events.  Events appear false due to oversensing/ noise.    Pt implanted for for syncope.    Linq Sensitivity currently set at 0.021mv, Blank after sense and Twave blanking interval both set at 187ms  Forwarding to MD to determine if reprogramming can be done.

## 2021-01-29 ENCOUNTER — Other Ambulatory Visit: Payer: Self-pay

## 2021-01-29 DIAGNOSIS — F02818 Dementia in other diseases classified elsewhere, unspecified severity, with other behavioral disturbance: Secondary | ICD-10-CM

## 2021-01-29 DIAGNOSIS — G20 Parkinson's disease: Secondary | ICD-10-CM

## 2021-01-29 DIAGNOSIS — G20A1 Parkinson's disease without dyskinesia, without mention of fluctuations: Secondary | ICD-10-CM

## 2021-01-29 MED ORDER — CARBIDOPA-LEVODOPA ER 25-100 MG PO TBCR: 1.0000 | EXTENDED_RELEASE_TABLET | Freq: Four times a day (QID) | ORAL | 1 refills | Status: DC

## 2021-02-02 DIAGNOSIS — S72001S Fracture of unspecified part of neck of right femur, sequela: Secondary | ICD-10-CM | POA: Diagnosis not present

## 2021-02-02 DIAGNOSIS — S7291XA Unspecified fracture of right femur, initial encounter for closed fracture: Secondary | ICD-10-CM | POA: Diagnosis not present

## 2021-02-02 DIAGNOSIS — I739 Peripheral vascular disease, unspecified: Secondary | ICD-10-CM | POA: Diagnosis not present

## 2021-02-02 DIAGNOSIS — G20 Parkinson's disease: Secondary | ICD-10-CM | POA: Diagnosis not present

## 2021-02-05 ENCOUNTER — Ambulatory Visit (INDEPENDENT_AMBULATORY_CARE_PROVIDER_SITE_OTHER): Payer: Medicare Other

## 2021-02-05 DIAGNOSIS — R55 Syncope and collapse: Secondary | ICD-10-CM

## 2021-02-06 LAB — CUP PACEART REMOTE DEVICE CHECK
Date Time Interrogation Session: 20221107055639
Implantable Pulse Generator Implant Date: 20210830

## 2021-02-06 NOTE — Telephone Encounter (Signed)
Reviewed with industry rep, only change we were able to make is to increase sensitivity from 0.044mV to 0.89mV.  Device already programmed for aggressive ectopy rejection and least sensitive for AT/AF detection.    Noted if this doesn't work to reduce unnecessary reports, then the only option would be to program off tachy detection.

## 2021-02-08 NOTE — Progress Notes (Signed)
Carelink Summary Report / Loop Recorder 

## 2021-02-20 NOTE — Progress Notes (Deleted)
Assessment/Plan:   1.  Parkinsons Disease             -Continue carbidopa/levodopa 25/100 CR, 1 tablet 4 times per day but change timing of med to 7am/10am/1pm/4pm (he is going to bed at 6pm).  -continue carbidopa/levodopa 50/200 CR at bed  -Unfortunately, after he fractured his hip, he really has not gained the ability to walk again.  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  Tried to explain apraxia to the patient and wife and daughter.  I think it is unfortunate that they had declined inpatient rehab from the SNF as this may have been helpful for him.  In the home, his wife cannot use the [facility] lift and therefore, he is in bed all day.  He is sleeping the entire day.  We talked extensively about regular daily schedule.   2.  GAD/depression             -Refuses Lexapro   3.  B12 deficiency             -takes B12 supplement   4.  RLS             -On iron supplementation for iron deficiency.      Subjective:   Rodney Love was seen today in follow up for Parkinsons disease.  My previous records were reviewed prior to todays visit as well as outside records available to me.  Patient wife present and supplements history.  Daughter emailed me after last visit stating that his right foot was turning inward and had what she described as painful spasms and she wondered if it was dystonia.  I emailed her back and told her if it was dystonia, then the treatment was Botox.  Told her I would be happy to refer her to PMR for that, but was not sure that insurance will pay for it since he does not ambulate any longer.  Also wondered if the turn was related to his hip fracture.  Current prescribed movement disorder medications: Carbidopa/levodopa 25/100 CR, 1 tablet 4 times per day (6am/10am/2pm/6pm) Carbidopa/levodopa 50/200 at bedtime Quetiapine (stopped last visit, as having daytime hypersomnolence.  Did not  think this was contributing as he was only on 12.5 mg). B12 supplement  Ferrous sulfate, 325 mg twice per day with vitamin C  Prior meds: Quetiapine (only on 12.5 mg, but I stopped because patient was having daytime hypersomnolence, even though I did not think it was related)  ALLERGIES:  No Known Allergies  CURRENT MEDICATIONS:  Outpatient Encounter Medications as of 02/27/2021  Medication Sig   acetaminophen (TYLENOL) 500 MG tablet Take 500 mg by mouth every 6 (six) hours as needed for moderate pain.   atorvastatin (LIPITOR) 20 MG tablet Take 20 mg by mouth daily.    carbidopa-levodopa (SINEMET CR) 50-200 MG tablet Take 1 tablet by mouth at bedtime.   Carbidopa-Levodopa ER (SINEMET CR) 25-100 MG tablet controlled release Take 1 tablet by mouth in the morning, at noon, in the evening, and at bedtime. 7am/10am/1pm/4pm   cholecalciferol (VITAMIN D3) 25 MCG (1000 UNIT) tablet Take 1,000 Units by mouth daily.   Cyanocobalamin (VITAMIN B 12 PO) Take 1 tablet by mouth daily.   ferrous sulfate 324 MG TBEC Take 324 mg by mouth daily at 6 (six) AM.   metFORMIN (GLUCOPHAGE) 850 MG tablet Take 850 mg by mouth 3 (three) times  a week. Mon/Wed/Fri   ONETOUCH ULTRA test strip    senna (SENOKOT) 8.6 MG TABS tablet Take 1 tablet (8.6 mg total) by mouth 2 (two) times daily.   [DISCONTINUED] pramipexole (MIRAPEX) 0.125 MG tablet Take 0.125 mg by mouth at bedtime.   Facility-Administered Encounter Medications as of 02/27/2021  Medication   lidocaine-EPINEPHrine (XYLOCAINE W/EPI) 1 %-1:100000 (with pres) injection 10 mL    Objective:   PHYSICAL EXAMINATION:    VITALS:   There were no vitals filed for this visit.  Wt Readings from Last 3 Encounters:  08/22/20 164 lb (74.4 kg)  04/28/20 164 lb 0.4 oz (74.4 kg)  03/17/20 164 lb (74.4 kg)     GEN:  The patient appears stated age and is in NAD. HEENT:  Normocephalic, atraumatic.  The mucous membranes are moist.  Neurological  examination:  Orientation: The patient is alert and oriented to person/place Cranial nerves: There is good facial symmetry with facial hypomimia. The speech is fluent and clear. Soft palate rises symmetrically and there is no tongue deviation. Hearing is intact to conversational tone. Sensation: Sensation is intact to light touch throughout Motor: Strength is at least antigravity x4.  Movement examination: Tone: There is normal tone in the bilateral upper extremities today and bilateral lower extremities (this is actually much improved) Abnormal movements: Rare left upper extremity rest tremor. Coordination:  There is decremation mostly with foot taps on the left. Gait and Station: Not tested as patient and daughter state that he really is not walking anymore.  I have reviewed and interpreted the following labs independently    Chemistry      Component Value Date/Time   NA 139 05/01/2020 0559   K 3.7 05/01/2020 0559   CL 110 05/01/2020 0559   CO2 23 05/01/2020 0559   BUN 15 05/01/2020 0559   CREATININE 0.71 05/01/2020 0559      Component Value Date/Time   CALCIUM 7.8 (L) 05/01/2020 0559   ALKPHOS 70 04/29/2020 0425   AST 14 (L) 04/29/2020 0425   ALT 17 04/29/2020 0425   BILITOT 1.6 (H) 04/29/2020 0425       Lab Results  Component Value Date   WBC 6.2 05/02/2020   HGB 10.0 (L) 05/02/2020   HCT 31.0 (L) 05/02/2020   MCV 97.8 05/02/2020   PLT 143 (L) 05/02/2020    No results found for: TSH   Total time spent on today's visit was *** minutes, including both face-to-face time and nonface-to-face time.  Time included that spent on review of records (prior notes available to me/labs/imaging if pertinent), discussing treatment and goals, answering patient's questions and coordinating care.  Cc:  Lujean Amel, MD

## 2021-02-27 ENCOUNTER — Ambulatory Visit: Payer: Medicare Other | Admitting: Neurology

## 2021-02-27 DIAGNOSIS — E7800 Pure hypercholesterolemia, unspecified: Secondary | ICD-10-CM | POA: Diagnosis not present

## 2021-02-27 DIAGNOSIS — E039 Hypothyroidism, unspecified: Secondary | ICD-10-CM | POA: Diagnosis not present

## 2021-02-27 DIAGNOSIS — I10 Essential (primary) hypertension: Secondary | ICD-10-CM | POA: Diagnosis not present

## 2021-02-27 DIAGNOSIS — E1169 Type 2 diabetes mellitus with other specified complication: Secondary | ICD-10-CM | POA: Diagnosis not present

## 2021-02-27 DIAGNOSIS — G20 Parkinson's disease: Secondary | ICD-10-CM | POA: Diagnosis not present

## 2021-03-07 DIAGNOSIS — Z0001 Encounter for general adult medical examination with abnormal findings: Secondary | ICD-10-CM | POA: Diagnosis not present

## 2021-03-07 DIAGNOSIS — I10 Essential (primary) hypertension: Secondary | ICD-10-CM | POA: Diagnosis not present

## 2021-03-07 DIAGNOSIS — E7800 Pure hypercholesterolemia, unspecified: Secondary | ICD-10-CM | POA: Diagnosis not present

## 2021-03-07 DIAGNOSIS — Z79899 Other long term (current) drug therapy: Secondary | ICD-10-CM | POA: Diagnosis not present

## 2021-03-07 DIAGNOSIS — G20 Parkinson's disease: Secondary | ICD-10-CM | POA: Diagnosis not present

## 2021-03-07 DIAGNOSIS — E039 Hypothyroidism, unspecified: Secondary | ICD-10-CM | POA: Diagnosis not present

## 2021-03-07 DIAGNOSIS — E1169 Type 2 diabetes mellitus with other specified complication: Secondary | ICD-10-CM | POA: Diagnosis not present

## 2021-03-08 LAB — CUP PACEART REMOTE DEVICE CHECK
Date Time Interrogation Session: 20221208080636
Implantable Pulse Generator Implant Date: 20210830

## 2021-03-12 ENCOUNTER — Ambulatory Visit (INDEPENDENT_AMBULATORY_CARE_PROVIDER_SITE_OTHER): Payer: Medicare Other

## 2021-03-12 DIAGNOSIS — R55 Syncope and collapse: Secondary | ICD-10-CM | POA: Diagnosis not present

## 2021-03-19 NOTE — Progress Notes (Signed)
Carelink Summary Report / Loop Recorder 

## 2021-03-20 ENCOUNTER — Telehealth: Payer: Self-pay

## 2021-03-20 NOTE — Telephone Encounter (Signed)
Tachy therapies disabled remotely and changes sent to device.

## 2021-03-20 NOTE — Telephone Encounter (Signed)
-----   Message from Sanda Klein, MD sent at 03/20/2021  2:55 PM EST ----- Regarding: RE: disabling tachy therapies??? Yes, unfortunately the tremors make that device useless. Please disable tachy detection. ----- Message ----- From: Benedetto Goad, RN Sent: 03/20/2021  10:04 AM EST To: Sanda Klein, MD Subject: disabling tachy therapies???                   "ILR tachy alert (27) in the past 24 hrs. 33 episodes on 03/19/21 report. Episode duration up to 8 minutes. All showing course noise/artifact as seen since implant due to reported patient tremors. Total 1789 episodes. Last symptom event 03/2020. 23 alerts in Nov, 18 daily alerts so far in December. Epic tele note discussing disabling tachy detection is recent sensitivity changes did not improve alert volume. Routing for further review. - JJB"  Hi Dr. C, received this alert this morning. Thoughts on disabling tachy therapies???  Thanks, [employer]

## 2021-04-03 DIAGNOSIS — G20 Parkinson's disease: Secondary | ICD-10-CM | POA: Diagnosis not present

## 2021-04-03 DIAGNOSIS — R221 Localized swelling, mass and lump, neck: Secondary | ICD-10-CM | POA: Diagnosis not present

## 2021-04-03 DIAGNOSIS — I10 Essential (primary) hypertension: Secondary | ICD-10-CM | POA: Diagnosis not present

## 2021-04-03 DIAGNOSIS — E1169 Type 2 diabetes mellitus with other specified complication: Secondary | ICD-10-CM | POA: Diagnosis not present

## 2021-04-03 DIAGNOSIS — Z79899 Other long term (current) drug therapy: Secondary | ICD-10-CM | POA: Diagnosis not present

## 2021-04-03 DIAGNOSIS — E039 Hypothyroidism, unspecified: Secondary | ICD-10-CM | POA: Diagnosis not present

## 2021-04-05 ENCOUNTER — Telehealth: Payer: Self-pay | Admitting: Cardiovascular Disease

## 2021-04-05 NOTE — Telephone Encounter (Signed)
° °  Pt's daughter said, pt is having transportation issue since pt is on a wheelchair. She wanted to ask if the pt's appt with Dr. Loletha Grayer on Monday can be change to virtual visit

## 2021-04-05 NOTE — Telephone Encounter (Signed)
Called patient daughter, she states he is unable to come due to transportation issues, and after review and speaking with primary RN, patient should be seen in office- patient daughter states she thought so, and would rather him come in so they would like to push this appointment out a few weeks to get transportation.   I advised I would cancel upcoming appointment, and primary RN would be back next week to review schedule. Daughter verbalized understanding.

## 2021-04-09 ENCOUNTER — Ambulatory Visit: Payer: Medicare Other | Admitting: Cardiovascular Disease

## 2021-04-11 DIAGNOSIS — E119 Type 2 diabetes mellitus without complications: Secondary | ICD-10-CM | POA: Diagnosis not present

## 2021-04-11 DIAGNOSIS — G4700 Insomnia, unspecified: Secondary | ICD-10-CM | POA: Diagnosis not present

## 2021-04-11 DIAGNOSIS — E039 Hypothyroidism, unspecified: Secondary | ICD-10-CM | POA: Diagnosis not present

## 2021-04-11 DIAGNOSIS — R221 Localized swelling, mass and lump, neck: Secondary | ICD-10-CM | POA: Diagnosis not present

## 2021-04-11 DIAGNOSIS — I10 Essential (primary) hypertension: Secondary | ICD-10-CM | POA: Diagnosis not present

## 2021-04-11 DIAGNOSIS — I2510 Atherosclerotic heart disease of native coronary artery without angina pectoris: Secondary | ICD-10-CM | POA: Diagnosis not present

## 2021-04-11 DIAGNOSIS — Z7982 Long term (current) use of aspirin: Secondary | ICD-10-CM | POA: Diagnosis not present

## 2021-04-11 DIAGNOSIS — Z9181 History of falling: Secondary | ICD-10-CM | POA: Diagnosis not present

## 2021-04-11 DIAGNOSIS — G20 Parkinson's disease: Secondary | ICD-10-CM | POA: Diagnosis not present

## 2021-04-11 NOTE — Telephone Encounter (Signed)
Spoke to the patient's daughter per the dpr. MyChart video visit set up for 1/18 with Dr. Sallyanne Kuster.

## 2021-04-12 DIAGNOSIS — R221 Localized swelling, mass and lump, neck: Secondary | ICD-10-CM | POA: Diagnosis not present

## 2021-04-12 DIAGNOSIS — Z7982 Long term (current) use of aspirin: Secondary | ICD-10-CM | POA: Diagnosis not present

## 2021-04-12 DIAGNOSIS — E119 Type 2 diabetes mellitus without complications: Secondary | ICD-10-CM | POA: Diagnosis not present

## 2021-04-12 DIAGNOSIS — Z9181 History of falling: Secondary | ICD-10-CM | POA: Diagnosis not present

## 2021-04-12 DIAGNOSIS — I2510 Atherosclerotic heart disease of native coronary artery without angina pectoris: Secondary | ICD-10-CM | POA: Diagnosis not present

## 2021-04-12 DIAGNOSIS — I10 Essential (primary) hypertension: Secondary | ICD-10-CM | POA: Diagnosis not present

## 2021-04-12 DIAGNOSIS — G4700 Insomnia, unspecified: Secondary | ICD-10-CM | POA: Diagnosis not present

## 2021-04-12 DIAGNOSIS — E039 Hypothyroidism, unspecified: Secondary | ICD-10-CM | POA: Diagnosis not present

## 2021-04-12 DIAGNOSIS — G20 Parkinson's disease: Secondary | ICD-10-CM | POA: Diagnosis not present

## 2021-04-16 ENCOUNTER — Ambulatory Visit (INDEPENDENT_AMBULATORY_CARE_PROVIDER_SITE_OTHER): Payer: Medicare Other

## 2021-04-16 DIAGNOSIS — R55 Syncope and collapse: Secondary | ICD-10-CM | POA: Diagnosis not present

## 2021-04-16 LAB — CUP PACEART REMOTE DEVICE CHECK
Date Time Interrogation Session: 20230116123339
Implantable Pulse Generator Implant Date: 20210830

## 2021-04-18 ENCOUNTER — Telehealth: Payer: Medicare Other | Admitting: Cardiovascular Disease

## 2021-04-18 ENCOUNTER — Other Ambulatory Visit: Payer: Self-pay

## 2021-04-18 ENCOUNTER — Telehealth (INDEPENDENT_AMBULATORY_CARE_PROVIDER_SITE_OTHER): Payer: Medicare Other | Admitting: Cardiovascular Disease

## 2021-04-18 ENCOUNTER — Encounter: Payer: Self-pay | Admitting: Cardiovascular Disease

## 2021-04-18 VITALS — BP 108/65 | HR 69 | Ht 73.0 in

## 2021-04-18 DIAGNOSIS — E119 Type 2 diabetes mellitus without complications: Secondary | ICD-10-CM

## 2021-04-18 DIAGNOSIS — E785 Hyperlipidemia, unspecified: Secondary | ICD-10-CM | POA: Diagnosis not present

## 2021-04-18 DIAGNOSIS — I6521 Occlusion and stenosis of right carotid artery: Secondary | ICD-10-CM | POA: Diagnosis not present

## 2021-04-18 DIAGNOSIS — E039 Hypothyroidism, unspecified: Secondary | ICD-10-CM | POA: Diagnosis not present

## 2021-04-18 DIAGNOSIS — I739 Peripheral vascular disease, unspecified: Secondary | ICD-10-CM | POA: Diagnosis not present

## 2021-04-18 DIAGNOSIS — Z9181 History of falling: Secondary | ICD-10-CM | POA: Diagnosis not present

## 2021-04-18 DIAGNOSIS — I2510 Atherosclerotic heart disease of native coronary artery without angina pectoris: Secondary | ICD-10-CM

## 2021-04-18 DIAGNOSIS — G20 Parkinson's disease: Secondary | ICD-10-CM | POA: Diagnosis not present

## 2021-04-18 DIAGNOSIS — Z7982 Long term (current) use of aspirin: Secondary | ICD-10-CM | POA: Diagnosis not present

## 2021-04-18 DIAGNOSIS — I10 Essential (primary) hypertension: Secondary | ICD-10-CM

## 2021-04-18 DIAGNOSIS — R221 Localized swelling, mass and lump, neck: Secondary | ICD-10-CM | POA: Diagnosis not present

## 2021-04-18 DIAGNOSIS — G4700 Insomnia, unspecified: Secondary | ICD-10-CM | POA: Diagnosis not present

## 2021-04-18 DIAGNOSIS — E7800 Pure hypercholesterolemia, unspecified: Secondary | ICD-10-CM | POA: Diagnosis not present

## 2021-04-18 DIAGNOSIS — E1169 Type 2 diabetes mellitus with other specified complication: Secondary | ICD-10-CM | POA: Diagnosis not present

## 2021-04-18 NOTE — Progress Notes (Signed)
Virtual Visit via Video Note   This visit type was conducted due to national recommendations for restrictions regarding the COVID-19 Pandemic (e.g. social distancing) in an effort to limit this patient's exposure and mitigate transmission in our community.  Due to his co-morbid illnesses, this patient is at least at moderate risk for complications without adequate follow up.  This format is felt to be most appropriate for this patient at this time.  All issues noted in this document were discussed and addressed.  A limited physical exam was performed with this format.  Please refer to the patient's chart for his consent to telehealth for [facility].       Date:  04/19/2021   ID:  Rodney Love, DOB [birth year], MRN [id number] The patient was identified using 2 identifiers.  Patient Location: Home Provider Location: Home Office   PCP:  Lujean Amel, MD   [place] Providers Cardiologist:  Sanda Klein, MD     Evaluation Performed:  Follow-Up Visit  Chief Complaint: PAD/CAD   History of Present Illness:    Rodney Love is a 76 y.o. male with essential hypertension, diabetes mellitus, dyslipidemia and widespread atherosclerosis (occlusion of the right carotid, bilateral iliac stenosis, nonobstructive coronary artery disease) and Parkinson's disease.  He has not had any cardiovascular complaints.  He is severely limited in activity level due to both Parkinson's disease and sequelae of his hip fracture in January 2022.  He had a fall with a closed fracture of the right femoral neck and underwent arthroplasty without complication, but subsequently was not able to tolerate rehabilitation, at least in part due to severe orthostatic hypotension.  He has been essentially bedridden for the last year.  He is just starting physical therapy, for the time being with supine exercises.  The patient specifically denies any chest pain at rest exertion, dyspnea at rest or with  exertion, orthopnea, paroxysmal nocturnal dyspnea, syncope, palpitations, focal neurological deficits, intermittent claudication, lower extremity edema, unexplained weight gain, cough, hemoptysis or wheezing.  Review of systems is completed by his family, since he has difficulty speaking beyond sentences consisting of 2 or 3 words.  Recent lipid profile was favorable with an HDL of 38 and LDL of 38.  Hemoglobin A1c was 5.4%.  Following weight loss, he no longer requires any medications for diabetes.  Lipids have also decreased substantially with weight loss.  Over the last couple of years his health complaints have been dominated by limitations related to Parkinson's disease.  This has drastically reduced his physical activity.  Activity level deteriorated even further and drastically following right hip fracture in January 2022.  Past Medical History:  Diagnosis Date   Arthritis    Constipation    OCCASIONAL   Coronary artery disease    Diabetes mellitus without complication ([place])    Environmental allergies    Hyperlipidemia    Hypertension    Inguinal hernia    Iron deficiency    Nocturia    Parkinson disease ([place])    Peripheral arterial disease ([place])    Past Surgical History:  Procedure Laterality Date   BACK SURGERY     CARDIAC CATHETERIZATION  05/29/2006   single vessel CAD w/moderate stenosis of the prox. RCA approx 40%,prox. LAD 20-30%,minor irreg. mid abdominal aorta,left iliac artery disease 50-60%   CATARACT EXTRACTION Left 12/2016   HERNIA REPAIR  [id number]   INGUINAL HERNIA REPAIR N/A 09/23/2012   Procedure: LAPAROSCOPIC INGUINAL HERNIA with umbilical hernia;  Surgeon: Aaron Edelman  Lilyan Punt, DO;  Location: [facility];  Service: General;  Laterality: N/A;   INSERTION OF MESH Bilateral 09/23/2012   Procedure: INSERTION OF MESH;  Surgeon: Madilyn Hook, DO;  Location: [facility];  Service: General;  Laterality: Bilateral;   TOTAL HIP ARTHROPLASTY Right 04/29/2020   Procedure: TOTAL HIP  ARTHROPLASTY ANTERIOR APPROACH;  Surgeon: Rod Can, MD;  Location: [facility];  Service: Orthopedics;  Laterality: Right;     Current Meds  Medication Sig   acetaminophen (TYLENOL) 500 MG tablet Take 500 mg by mouth every 6 (six) hours as needed for moderate pain.   aspirin 81 MG EC tablet Take 81 mg by mouth daily.   atorvastatin (LIPITOR) 10 MG tablet Take 1 tablet (10 mg total) by mouth daily.   carbidopa-levodopa (SINEMET CR) 50-200 MG tablet Take 1 tablet by mouth at bedtime.   Carbidopa-Levodopa ER (SINEMET CR) 25-100 MG tablet controlled release Take 1 tablet by mouth in the morning, at noon, in the evening, and at bedtime. 7am/10am/1pm/4pm   Cyanocobalamin (VITAMIN B 12 PO) Take 1 tablet by mouth daily.   ferrous sulfate 324 MG TBEC Take 324 mg by mouth daily at 6 (six) AM.   guaiFENesin (MUCINEX) 600 MG 12 hr tablet Take 600 mg by mouth daily as needed.   ONETOUCH ULTRA test strip    Potassium 99 MG TABS Take 99 mg by mouth daily.   QUEtiapine (SEROQUEL) 25 MG tablet Take 1 tablet by mouth at bedtime.   [DISCONTINUED] atorvastatin (LIPITOR) 20 MG tablet Take 20 mg by mouth daily.    Current Facility-Administered Medications for the 04/18/21 encounter (Video Visit) with Dwana Garin, MD  Medication   lidocaine-EPINEPHrine (XYLOCAINE W/EPI) 1 %-1:100000 (with pres) injection 10 mL     Allergies:   Patient has no known allergies.   Social History   Tobacco Use   Smoking status: Former    Types: Cigarettes    Quit date: 04/02/1991    Years since quitting: 30.0   Smokeless tobacco: Former    Quit date: 08/06/1991  Vaping Use   Vaping Use: Never used  Substance Use Topics   Alcohol use: No   Drug use: No     Family Hx: The patient's family history includes Cancer in his sister and sister; Diabetes in his mother and sister; Healthy in his daughter, daughter, and son; Kidney disease in his mother; Stroke in his brother and father.  ROS:   Please see the history of  present illness.    All other systems are reviewed and are negative.   Prior CV studies:   The following studies were reviewed today:  09/22/2018 carotid ultrasound with 100% occlusion right carotid, mild plaque left carotid 10/15/2019 unchanged 06/24/2019 aortoiliac ultrasound with greater than 50% right external iliac stenosis, greater than 50% left external iliac stenosis site of previous angioplasty, but acceptable ABI right 0.92 (0.98 previously), left 0.80 (0.84 previously).  Labs/Other Tests and Data Reviewed:    EKG: Ordered today shows sinus rhythm with single PVC, otherwise normal tracing.  Normal QTC 438 ms  Recent Labs: July 2020 hemoglobin A1c 6.4%, creatinine 1.05, normal electrolytes and liver function tests 03/16/2019 hemoglobin A1c 6% 06/10/2019 TSH 4.73 09/03/2019 hemoglobin 12.9, creatinine 0.73, normal liver function tests  01/03/2021 Hemoglobin 14.6  04/03/2021 Creatinine 0.68, potassium 3.5 Recent Lipid Panel July 2020 total cholesterol 100, HDL 35, LDL 51, triglycerides 70 07/13/2019 total cholesterol 92, triglycerides 49 Wt Readings from Last 3 Encounters:  08/22/20 164 lb (74.4 kg)  04/28/20 164 lb 0.4 oz (74.4 kg)  03/17/20 164 lb (74.4 kg)     Objective:    Vital Signs:  BP 108/65 (BP Location: Left Arm)    Pulse 69    Ht 6\' 1"  (1.854 m)    SpO2 98%    BMI 21.64 kg/m    Appears chronically ill. Has lost a lot of weight and is borderline underweight. "Frozen" facial features Lying in bed sitting up at about 30 degrees, appears comfortable Breathing is nonlabored Does not appear to be pale. Speech is in very short sentences with difficult initiation Parkinsonian tremors noted  ASSESSMENT & PLAN:    1. PAD (peripheral artery disease) (HCC)   2. Carotid occlusion, right   3. Atherosclerosis of native coronary artery of native heart without angina pectoris   4. Dyslipidemia (high LDL; low HDL)   5. Diabetes mellitus type 2 in nonobese (HCC)   6.  Essential hypertension      PAD: Asymptomatic since he is essentially completely sedentary.  No reports of nonhealing ulcerations ASCVD: Chronic occlusion of the right carotid artery, no evidence of disease progression on the left.  No new focal neurological events. CAD: Denies angina.  Activity level is essentially 0. HLP: Cholesterol numbers are much lower with substantial weight loss.  Decrease atorvastatin to 10 mg once daily. DM: Glucose not well controlled without medications. HTN: Relatively low blood pressure.  At very high risk for orthostatic hypotension due to his neurological condition.  Previous problems with orthostatic hypotension, falls, serious injuries.  If he starts to do physical therapy in an upright position, strongly recommend compression stockings and even an abdominal binder.  Carefully monitoring blood pressure during physical therapy.  Medication Adjustments/Labs and Tests Ordered: Current medicines are reviewed at length with the patient today.  Concerns regarding medicines are outlined above.   Time:   Today, I have spent 24 minutes with the patient with telehealth technology discussing the above problems.    Patient Instructions  Medication Instructions:  Reduce atorvastatin to 10 mg once daily. *If you need a refill on your cardiac medications before your next appointment, please call your pharmacy*   Lab Work: None ordered If you have labs (blood work) drawn today and your tests are completely normal, you will receive your results only by: [place] (if you have MyChart) OR A paper copy in the mail If you have any lab test that is abnormal or we need to change your treatment, we will call you to review the results.   Testing/Procedures: None ordered   Follow-Up: At [facility], you and your health needs are our priority.  As part of our continuing mission to provide you with exceptional heart care, we have created designated Provider Care  Teams.  These Care Teams include your primary Cardiologist (physician) and Advanced Practice Providers (APPs -  Physician Assistants and Nurse Practitioners) who all work together to provide you with the care you need, when you need it.  We recommend signing up for the patient portal called "MyChart".  Sign up information is provided on this After Visit Summary.  MyChart is used to connect with patients for Virtual Visits (Telemedicine).  Patients are able to view lab/test results, encounter notes, upcoming appointments, etc.  Non-urgent messages can be sent to your provider as well.   To learn more about what you can do with MyChart, go to [email or web address].    Your next appointment:   12 month(s)  The format for your  next appointment:   In Person  Provider:   Sanda Klein, MD   Other Instructions Wear compression stockings when sitting upright.     Signed, Sanda Klein, MD  04/19/2021 1:11 PM    [place]

## 2021-04-18 NOTE — Patient Instructions (Addendum)
Medication Instructions:  No changes *If you need a refill on your cardiac medications before your next appointment, please call your pharmacy*   Lab Work: None ordered If you have labs (blood work) drawn today and your tests are completely normal, you will receive your results only by: [place] (if you have MyChart) OR A paper copy in the mail If you have any lab test that is abnormal or we need to change your treatment, we will call you to review the results.   Testing/Procedures: None ordered   Follow-Up: At [facility], you and your health needs are our priority.  As part of our continuing mission to provide you with exceptional heart care, we have created designated Provider Care Teams.  These Care Teams include your primary Cardiologist (physician) and Advanced Practice Providers (APPs -  Physician Assistants and Nurse Practitioners) who all work together to provide you with the care you need, when you need it.  We recommend signing up for the patient portal called "MyChart".  Sign up information is provided on this After Visit Summary.  MyChart is used to connect with patients for Virtual Visits (Telemedicine).  Patients are able to view lab/test results, encounter notes, upcoming appointments, etc.  Non-urgent messages can be sent to your provider as well.   To learn more about what you can do with MyChart, go to [email or web address].    Your next appointment:   12 month(s)  The format for your next appointment:   In Person  Provider:   Sanda Klein, MD   Other Instructions Wear compression stockings when sitting upright.

## 2021-04-19 ENCOUNTER — Encounter: Payer: Self-pay | Admitting: Cardiovascular Disease

## 2021-04-19 DIAGNOSIS — Z7982 Long term (current) use of aspirin: Secondary | ICD-10-CM | POA: Diagnosis not present

## 2021-04-19 DIAGNOSIS — G20 Parkinson's disease: Secondary | ICD-10-CM | POA: Diagnosis not present

## 2021-04-19 DIAGNOSIS — I2510 Atherosclerotic heart disease of native coronary artery without angina pectoris: Secondary | ICD-10-CM | POA: Diagnosis not present

## 2021-04-19 DIAGNOSIS — E039 Hypothyroidism, unspecified: Secondary | ICD-10-CM | POA: Diagnosis not present

## 2021-04-19 DIAGNOSIS — Z9181 History of falling: Secondary | ICD-10-CM | POA: Diagnosis not present

## 2021-04-19 DIAGNOSIS — I10 Essential (primary) hypertension: Secondary | ICD-10-CM | POA: Diagnosis not present

## 2021-04-19 DIAGNOSIS — G4700 Insomnia, unspecified: Secondary | ICD-10-CM | POA: Diagnosis not present

## 2021-04-19 DIAGNOSIS — E119 Type 2 diabetes mellitus without complications: Secondary | ICD-10-CM | POA: Diagnosis not present

## 2021-04-19 DIAGNOSIS — R221 Localized swelling, mass and lump, neck: Secondary | ICD-10-CM | POA: Diagnosis not present

## 2021-04-19 MED ORDER — ATORVASTATIN CALCIUM 10 MG PO TABS: 10.0000 mg | ORAL_TABLET | Freq: Every day | ORAL | 3 refills | Status: DC

## 2021-04-23 ENCOUNTER — Ambulatory Visit: Payer: Medicare Other | Admitting: Neurology

## 2021-04-24 DIAGNOSIS — E119 Type 2 diabetes mellitus without complications: Secondary | ICD-10-CM | POA: Diagnosis not present

## 2021-04-24 DIAGNOSIS — G4700 Insomnia, unspecified: Secondary | ICD-10-CM | POA: Diagnosis not present

## 2021-04-24 DIAGNOSIS — E039 Hypothyroidism, unspecified: Secondary | ICD-10-CM | POA: Diagnosis not present

## 2021-04-24 DIAGNOSIS — Z9181 History of falling: Secondary | ICD-10-CM | POA: Diagnosis not present

## 2021-04-24 DIAGNOSIS — R221 Localized swelling, mass and lump, neck: Secondary | ICD-10-CM | POA: Diagnosis not present

## 2021-04-24 DIAGNOSIS — G20 Parkinson's disease: Secondary | ICD-10-CM | POA: Diagnosis not present

## 2021-04-24 DIAGNOSIS — I2510 Atherosclerotic heart disease of native coronary artery without angina pectoris: Secondary | ICD-10-CM | POA: Diagnosis not present

## 2021-04-24 DIAGNOSIS — I10 Essential (primary) hypertension: Secondary | ICD-10-CM | POA: Diagnosis not present

## 2021-04-24 DIAGNOSIS — Z7982 Long term (current) use of aspirin: Secondary | ICD-10-CM | POA: Diagnosis not present

## 2021-04-25 NOTE — Progress Notes (Signed)
Carelink Summary Report / Loop Recorder 

## 2021-04-26 DIAGNOSIS — Z7982 Long term (current) use of aspirin: Secondary | ICD-10-CM | POA: Diagnosis not present

## 2021-04-26 DIAGNOSIS — G20 Parkinson's disease: Secondary | ICD-10-CM | POA: Diagnosis not present

## 2021-04-26 DIAGNOSIS — E119 Type 2 diabetes mellitus without complications: Secondary | ICD-10-CM | POA: Diagnosis not present

## 2021-04-26 DIAGNOSIS — I10 Essential (primary) hypertension: Secondary | ICD-10-CM | POA: Diagnosis not present

## 2021-04-26 DIAGNOSIS — R221 Localized swelling, mass and lump, neck: Secondary | ICD-10-CM | POA: Diagnosis not present

## 2021-04-26 DIAGNOSIS — I2510 Atherosclerotic heart disease of native coronary artery without angina pectoris: Secondary | ICD-10-CM | POA: Diagnosis not present

## 2021-04-26 DIAGNOSIS — Z9181 History of falling: Secondary | ICD-10-CM | POA: Diagnosis not present

## 2021-04-26 DIAGNOSIS — E039 Hypothyroidism, unspecified: Secondary | ICD-10-CM | POA: Diagnosis not present

## 2021-04-26 DIAGNOSIS — G4700 Insomnia, unspecified: Secondary | ICD-10-CM | POA: Diagnosis not present

## 2021-04-27 DIAGNOSIS — G4700 Insomnia, unspecified: Secondary | ICD-10-CM | POA: Diagnosis not present

## 2021-04-27 DIAGNOSIS — Z9181 History of falling: Secondary | ICD-10-CM | POA: Diagnosis not present

## 2021-04-27 DIAGNOSIS — I2510 Atherosclerotic heart disease of native coronary artery without angina pectoris: Secondary | ICD-10-CM | POA: Diagnosis not present

## 2021-04-27 DIAGNOSIS — Z7982 Long term (current) use of aspirin: Secondary | ICD-10-CM | POA: Diagnosis not present

## 2021-04-27 DIAGNOSIS — I10 Essential (primary) hypertension: Secondary | ICD-10-CM | POA: Diagnosis not present

## 2021-04-27 DIAGNOSIS — G20 Parkinson's disease: Secondary | ICD-10-CM | POA: Diagnosis not present

## 2021-04-27 DIAGNOSIS — E119 Type 2 diabetes mellitus without complications: Secondary | ICD-10-CM | POA: Diagnosis not present

## 2021-04-27 DIAGNOSIS — E039 Hypothyroidism, unspecified: Secondary | ICD-10-CM | POA: Diagnosis not present

## 2021-04-27 DIAGNOSIS — R221 Localized swelling, mass and lump, neck: Secondary | ICD-10-CM | POA: Diagnosis not present

## 2021-05-03 DIAGNOSIS — E039 Hypothyroidism, unspecified: Secondary | ICD-10-CM | POA: Diagnosis not present

## 2021-05-03 DIAGNOSIS — I2510 Atherosclerotic heart disease of native coronary artery without angina pectoris: Secondary | ICD-10-CM | POA: Diagnosis not present

## 2021-05-03 DIAGNOSIS — R221 Localized swelling, mass and lump, neck: Secondary | ICD-10-CM | POA: Diagnosis not present

## 2021-05-03 DIAGNOSIS — G20 Parkinson's disease: Secondary | ICD-10-CM | POA: Diagnosis not present

## 2021-05-03 DIAGNOSIS — Z7982 Long term (current) use of aspirin: Secondary | ICD-10-CM | POA: Diagnosis not present

## 2021-05-03 DIAGNOSIS — I10 Essential (primary) hypertension: Secondary | ICD-10-CM | POA: Diagnosis not present

## 2021-05-03 DIAGNOSIS — G4700 Insomnia, unspecified: Secondary | ICD-10-CM | POA: Diagnosis not present

## 2021-05-03 DIAGNOSIS — Z9181 History of falling: Secondary | ICD-10-CM | POA: Diagnosis not present

## 2021-05-03 DIAGNOSIS — E119 Type 2 diabetes mellitus without complications: Secondary | ICD-10-CM | POA: Diagnosis not present

## 2021-05-11 DIAGNOSIS — E119 Type 2 diabetes mellitus without complications: Secondary | ICD-10-CM | POA: Diagnosis not present

## 2021-05-11 DIAGNOSIS — Z9181 History of falling: Secondary | ICD-10-CM | POA: Diagnosis not present

## 2021-05-11 DIAGNOSIS — I2510 Atherosclerotic heart disease of native coronary artery without angina pectoris: Secondary | ICD-10-CM | POA: Diagnosis not present

## 2021-05-11 DIAGNOSIS — I10 Essential (primary) hypertension: Secondary | ICD-10-CM | POA: Diagnosis not present

## 2021-05-11 DIAGNOSIS — G4700 Insomnia, unspecified: Secondary | ICD-10-CM | POA: Diagnosis not present

## 2021-05-11 DIAGNOSIS — E039 Hypothyroidism, unspecified: Secondary | ICD-10-CM | POA: Diagnosis not present

## 2021-05-11 DIAGNOSIS — R221 Localized swelling, mass and lump, neck: Secondary | ICD-10-CM | POA: Diagnosis not present

## 2021-05-11 DIAGNOSIS — G20 Parkinson's disease: Secondary | ICD-10-CM | POA: Diagnosis not present

## 2021-05-11 DIAGNOSIS — Z7982 Long term (current) use of aspirin: Secondary | ICD-10-CM | POA: Diagnosis not present

## 2021-05-16 DIAGNOSIS — G20 Parkinson's disease: Secondary | ICD-10-CM | POA: Diagnosis not present

## 2021-05-16 DIAGNOSIS — Z7982 Long term (current) use of aspirin: Secondary | ICD-10-CM | POA: Diagnosis not present

## 2021-05-16 DIAGNOSIS — G4700 Insomnia, unspecified: Secondary | ICD-10-CM | POA: Diagnosis not present

## 2021-05-16 DIAGNOSIS — Z9181 History of falling: Secondary | ICD-10-CM | POA: Diagnosis not present

## 2021-05-16 DIAGNOSIS — E039 Hypothyroidism, unspecified: Secondary | ICD-10-CM | POA: Diagnosis not present

## 2021-05-16 DIAGNOSIS — R221 Localized swelling, mass and lump, neck: Secondary | ICD-10-CM | POA: Diagnosis not present

## 2021-05-16 DIAGNOSIS — E119 Type 2 diabetes mellitus without complications: Secondary | ICD-10-CM | POA: Diagnosis not present

## 2021-05-16 DIAGNOSIS — I2510 Atherosclerotic heart disease of native coronary artery without angina pectoris: Secondary | ICD-10-CM | POA: Diagnosis not present

## 2021-05-16 DIAGNOSIS — I10 Essential (primary) hypertension: Secondary | ICD-10-CM | POA: Diagnosis not present

## 2021-05-21 ENCOUNTER — Ambulatory Visit (INDEPENDENT_AMBULATORY_CARE_PROVIDER_SITE_OTHER): Payer: Medicare Other

## 2021-05-21 DIAGNOSIS — R55 Syncope and collapse: Secondary | ICD-10-CM

## 2021-05-21 LAB — CUP PACEART REMOTE DEVICE CHECK
Date Time Interrogation Session: 20230220115317
Implantable Pulse Generator Implant Date: 20210830

## 2021-05-22 ENCOUNTER — Other Ambulatory Visit: Payer: Self-pay | Admitting: Neurology

## 2021-05-22 DIAGNOSIS — I10 Essential (primary) hypertension: Secondary | ICD-10-CM | POA: Diagnosis not present

## 2021-05-22 DIAGNOSIS — G4700 Insomnia, unspecified: Secondary | ICD-10-CM | POA: Diagnosis not present

## 2021-05-22 DIAGNOSIS — E039 Hypothyroidism, unspecified: Secondary | ICD-10-CM | POA: Diagnosis not present

## 2021-05-22 DIAGNOSIS — E119 Type 2 diabetes mellitus without complications: Secondary | ICD-10-CM | POA: Diagnosis not present

## 2021-05-22 DIAGNOSIS — Z9181 History of falling: Secondary | ICD-10-CM | POA: Diagnosis not present

## 2021-05-22 DIAGNOSIS — G20 Parkinson's disease: Secondary | ICD-10-CM | POA: Diagnosis not present

## 2021-05-22 DIAGNOSIS — I2510 Atherosclerotic heart disease of native coronary artery without angina pectoris: Secondary | ICD-10-CM | POA: Diagnosis not present

## 2021-05-22 DIAGNOSIS — R221 Localized swelling, mass and lump, neck: Secondary | ICD-10-CM | POA: Diagnosis not present

## 2021-05-22 DIAGNOSIS — Z7982 Long term (current) use of aspirin: Secondary | ICD-10-CM | POA: Diagnosis not present

## 2021-05-24 DIAGNOSIS — E1169 Type 2 diabetes mellitus with other specified complication: Secondary | ICD-10-CM | POA: Diagnosis not present

## 2021-05-24 DIAGNOSIS — I10 Essential (primary) hypertension: Secondary | ICD-10-CM | POA: Diagnosis not present

## 2021-05-24 DIAGNOSIS — E7800 Pure hypercholesterolemia, unspecified: Secondary | ICD-10-CM | POA: Diagnosis not present

## 2021-05-24 DIAGNOSIS — G20 Parkinson's disease: Secondary | ICD-10-CM | POA: Diagnosis not present

## 2021-05-24 NOTE — Progress Notes (Signed)
Carelink Summary Report / Loop Recorder 

## 2021-05-29 DIAGNOSIS — G20 Parkinson's disease: Secondary | ICD-10-CM | POA: Diagnosis not present

## 2021-05-29 DIAGNOSIS — I10 Essential (primary) hypertension: Secondary | ICD-10-CM | POA: Diagnosis not present

## 2021-05-29 DIAGNOSIS — G4700 Insomnia, unspecified: Secondary | ICD-10-CM | POA: Diagnosis not present

## 2021-05-29 DIAGNOSIS — E119 Type 2 diabetes mellitus without complications: Secondary | ICD-10-CM | POA: Diagnosis not present

## 2021-05-29 DIAGNOSIS — R221 Localized swelling, mass and lump, neck: Secondary | ICD-10-CM | POA: Diagnosis not present

## 2021-05-29 DIAGNOSIS — I2510 Atherosclerotic heart disease of native coronary artery without angina pectoris: Secondary | ICD-10-CM | POA: Diagnosis not present

## 2021-05-29 DIAGNOSIS — E039 Hypothyroidism, unspecified: Secondary | ICD-10-CM | POA: Diagnosis not present

## 2021-05-29 DIAGNOSIS — Z7982 Long term (current) use of aspirin: Secondary | ICD-10-CM | POA: Diagnosis not present

## 2021-05-29 DIAGNOSIS — Z9181 History of falling: Secondary | ICD-10-CM | POA: Diagnosis not present

## 2021-06-25 ENCOUNTER — Ambulatory Visit (INDEPENDENT_AMBULATORY_CARE_PROVIDER_SITE_OTHER): Payer: Medicare Other

## 2021-06-25 DIAGNOSIS — R55 Syncope and collapse: Secondary | ICD-10-CM | POA: Diagnosis not present

## 2021-06-26 LAB — CUP PACEART REMOTE DEVICE CHECK
Date Time Interrogation Session: 20230328140426
Implantable Pulse Generator Implant Date: 20210830

## 2021-06-26 NOTE — Progress Notes (Signed)
? ? ?Assessment/Plan:  ? ?1.  Parkinsons Disease ?            -increase carbidopa/levodopa 25/100 CR, 1.5 tablet 4 times per day but change timing of med to 7am/10am/1pm/4pm (he is going to bed at 6pm). ?            -continue carbidopa/levodopa 50/200 CR at bed ?            -Unfortunately, after he fractured his hip, he really has not gained the ability to walk again.  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 I think it is unfortunate that they had declined inpatient rehab from the SNF as this may have been helpful for him.  In the home, his wife cannot use the [facility] lift and therefore, he is in bed all day.  He is sleeping much of the day. ? -I am worried that the patient's wife really is not able to care for him well, because he cannot move them because of her own medical issues (she is on oxygen).  She cannot use the [facility] lift because it is not electric.  They are going to check to see if the insurance will pay for an electric lift (if so we will write for 1).  Patient is losing weight.  Wife has very little help in the home.  She does not want to send him into an alternative living situation, but I worry about this 1.  I am going to have our social worker reach back out to palliative care and figure out why they told him he was no longer a candidate to be in palliative care.  I think he may be trending toward hospice in the future. ?  ?2.  GAD/depression ?            -Refuses Lexapro ?  ?3.  B12 deficiency ?            -takes B12 supplement ?  ?4.  RLS ?            -On iron supplementation for iron deficiency. ?  ?5.  Contractures ? -pt has contractures in the R leg and some in the L hand.    For the left hand, the patient just needs an Occupational Therapy sponge or even washcloth so that the patient's nails do not begin to him.  They state that [facility] told him that they could not help him.  I understand that they would  not be able to do much physical therapy with him, but it is unusual that OT could not at least provide him some basic resources.  Again, I am having my social worker try to reach out. ? -wife asks about the right foot turning in.  I really think this is more contracture.  It does not appear to be dystonic.  This is the same hip that had surgery on it.  I am not sure if Botox would be helpful if it is all contracture, but we did discuss potential utility.  They would think about that.  It is very difficult to get the patient out of the home. ? ?6.  I am not sure that the patient's wife is going to be able to get him here anymore.  Offered video visit every 6 to 9 months. ? ?Subjective:  ? ?Rodney Love was seen today in follow up for Parkinsons disease.  My previous records were reviewed prior to todays visit as well as outside records available to me.  Patient wife present and supplements history.  I have not seen the patient in about a year (patient canceled November appointment).  Daughter emailed me after last visit stating that his right foot was turning inward and had what she described as painful spasms and she wondered if it was dystonia.  I emailed her back and told her if it was dystonia, then the treatment was Botox.  Told her I would be happy to refer her to PMR for that, but was not sure that insurance will pay for it since he does not ambulate any longer.  Also wondered if the turn was related to his hip fracture.  I did not hear from them after that.  That was last August. Wife states that this is the leg of the hip fracture and it is still turning in.  Even if they put a wedge pillow between the legs, they find that the right leg/foot will turn inward.   He is following with heart care.  He was last seen in January, 2023. ?  ?Current prescribed movement disorder medications: ?Carbidopa/levodopa 25/100 CR, 1 tablet 4 times per day (6am/10am/2pm/6pm) ?Carbidopa/levodopa 50/200 at bedtime ?Quetiapine  (stopped last visit, as having daytime hypersomnolence.  Did not think this was contributing as he was only on 12.5 mg). ?B12 supplement  ?Ferrous sulfate, 325 mg twice per day with vitamin C ?  ?Prior meds: Quetiapine (only on 12.5 mg, but I stopped because patient was having daytime hypersomnolence, even though I did not think it was related) ? ? ?ALLERGIES:  No Known Allergies ? ?CURRENT MEDICATIONS:  ?Outpatient Encounter Medications as of 06/28/2021  ?Medication Sig  ? acetaminophen (TYLENOL) 500 MG tablet Take 500 mg by mouth every 6 (six) hours as needed for moderate pain.  ? aspirin 81 MG EC tablet Take 81 mg by mouth daily.  ? atorvastatin (LIPITOR) 10 MG tablet Take 1 tablet (10 mg total) by mouth daily.  ? carbidopa-levodopa (SINEMET CR) 50-200 MG tablet Take 1 tablet by mouth at bedtime.  ? Carbidopa-Levodopa ER (SINEMET CR) 25-100 MG tablet controlled release Take 1 tablet by mouth in the morning, at noon, in the evening, and at bedtime. 7am/10am/1pm/4pm  ? cholecalciferol (VITAMIN D3) 25 MCG (1000 UNIT) tablet Take 1,000 Units by mouth daily. (Patient not taking: Reported on 04/18/2021)  ? Cyanocobalamin (VITAMIN B 12 PO) Take 1 tablet by mouth daily.  ? ferrous sulfate 324 MG TBEC Take 324 mg by mouth daily at 6 (six) AM.  ? guaiFENesin (MUCINEX) 600 MG 12 hr tablet Take 600 mg by mouth daily as needed.  ? metFORMIN (GLUCOPHAGE) 850 MG tablet Take 850 mg by mouth 3 (three) times a week. Mon/Wed/Fri (Patient not taking: Reported on 04/18/2021)  ? ONETOUCH ULTRA test strip   ? Potassium 99 MG TABS Take 99 mg by mouth daily.  ? QUEtiapine (SEROQUEL) 25 MG tablet Take 1 tablet by mouth at bedtime.  ? senna (SENOKOT) 8.6 MG TABS tablet Take 1 tablet (8.6 mg total) by mouth 2 (two) times daily. (Patient not taking: Reported on 04/18/2021)  ? [DISCONTINUED] pramipexole (MIRAPEX) 0.125 MG tablet Take 0.125 mg by mouth at bedtime.  ? ?Facility-Administered Encounter Medications as of 06/28/2021  ?Medication  ?  lidocaine-EPINEPHrine (XYLOCAINE W/EPI) 1 %-1:100000 (with pres) injection 10 mL  ? ? ?Objective:  ? ?PHYSICAL EXAMINATION:   ? ?VITALS:   ?Vitals:  ?  06/28/21 1125  ?BP: 97/80  ?Pulse: 62  ?SpO2: 98%  ?Weight: 164 lb (74.4 kg)  ?Height: '6\' 1"'$  (1.854 m)  ? ? ?Wt Readings from Last 3 Encounters:  ?06/28/21 164 lb (74.4 kg)  ?08/22/20 164 lb (74.4 kg)  ?04/28/20 164 lb 0.4 oz (74.4 kg)  ? ? ? ?GEN:  The patient appears stated age and is in NAD. ?HEENT:  Normocephalic, atraumatic.  The mucous membranes are moist. ? ?Neurological examination: ? ?Orientation: The patient is alert and oriented to person/place ?Cranial nerves: There is good facial symmetry with facial hypomimia. The speech is fluent and clear.  He talks with me coherently.  Soft palate rises symmetrically and there is no tongue deviation. Hearing is intact to conversational tone. ?Sensation: Sensation is intact to light touch throughout ?Motor: Strength is at least antigravity x4.  The right hip is internally rotated. ? ?Movement examination: ?Tone: There is moderate rigidity on the left. ?Abnormal movements: He has left upper extremity rest tremor. ?Coordination:  There is decremation mostly with foot taps on the left. ?Gait and Station: Unable ? ?I have reviewed and interpreted the following labs independently ? ?  Chemistry   ?   ?Component Value Date/Time  ? NA 139 05/01/2020 0559  ? K 3.7 05/01/2020 0559  ? CL 110 05/01/2020 0559  ? CO2 23 05/01/2020 0559  ? BUN 15 05/01/2020 0559  ? CREATININE 0.71 05/01/2020 0559  ?    ?Component Value Date/Time  ? CALCIUM 7.8 (L) 05/01/2020 0559  ? ALKPHOS 70 04/29/2020 0425  ? AST 14 (L) 04/29/2020 0425  ? ALT 17 04/29/2020 0425  ? BILITOT 1.6 (H) 04/29/2020 0425  ?  ? ? ? ?Lab Results  ?Component Value Date  ? WBC 6.2 05/02/2020  ? HGB 10.0 (L) 05/02/2020  ? HCT 31.0 (L) 05/02/2020  ? MCV 97.8 05/02/2020  ? PLT 143 (L) 05/02/2020  ? ? ?No results found for: TSH ? ? ?Total time spent on today's visit was 41  minutes, including both face-to-face time and nonface-to-face time.  Time included that spent on review of records (prior notes available to me/labs/imaging if pertinent), discussing treatment and goals, answerin

## 2021-06-28 ENCOUNTER — Ambulatory Visit: Payer: Medicare Other | Admitting: Neurology

## 2021-06-28 ENCOUNTER — Encounter: Payer: Self-pay | Admitting: Neurology

## 2021-06-28 ENCOUNTER — Other Ambulatory Visit: Payer: Self-pay

## 2021-06-28 DIAGNOSIS — F02818 Dementia in other diseases classified elsewhere, unspecified severity, with other behavioral disturbance: Secondary | ICD-10-CM

## 2021-06-28 DIAGNOSIS — G20A1 Parkinson's disease without dyskinesia, without mention of fluctuations: Secondary | ICD-10-CM

## 2021-06-28 DIAGNOSIS — G20 Parkinson's disease: Secondary | ICD-10-CM

## 2021-06-28 MED ORDER — CARBIDOPA-LEVODOPA ER 25-100 MG PO TBCR: EXTENDED_RELEASE_TABLET | ORAL | 1 refills | Status: DC

## 2021-06-28 NOTE — Patient Instructions (Signed)
Increase carbidopa/levodopa 25/100 to 1.5 tablets to 7am/10am/1pm/4pm ?Let us know if you want to consider a referral for botox for the leg ?

## 2021-07-04 NOTE — Progress Notes (Signed)
Carelink Summary Report / Loop Recorder 

## 2021-07-16 DIAGNOSIS — G20 Parkinson's disease: Secondary | ICD-10-CM | POA: Diagnosis not present

## 2021-07-16 DIAGNOSIS — E7800 Pure hypercholesterolemia, unspecified: Secondary | ICD-10-CM | POA: Diagnosis not present

## 2021-07-16 DIAGNOSIS — E1169 Type 2 diabetes mellitus with other specified complication: Secondary | ICD-10-CM | POA: Diagnosis not present

## 2021-07-16 DIAGNOSIS — I10 Essential (primary) hypertension: Secondary | ICD-10-CM | POA: Diagnosis not present

## 2021-07-30 ENCOUNTER — Ambulatory Visit (INDEPENDENT_AMBULATORY_CARE_PROVIDER_SITE_OTHER): Payer: Medicare Other

## 2021-07-30 DIAGNOSIS — R55 Syncope and collapse: Secondary | ICD-10-CM

## 2021-07-31 LAB — CUP PACEART REMOTE DEVICE CHECK
Date Time Interrogation Session: 20230502021905
Implantable Pulse Generator Implant Date: 20210830

## 2021-08-03 ENCOUNTER — Other Ambulatory Visit: Payer: Self-pay | Admitting: Cardiovascular Disease

## 2021-08-13 NOTE — Progress Notes (Signed)
Carelink Summary Report / Loop Recorder 

## 2021-08-17 ENCOUNTER — Telehealth: Payer: Self-pay

## 2021-08-17 NOTE — Telephone Encounter (Signed)
Volunteer check in call for palliative care. Spoke with daughter.

## 2021-09-01 LAB — CUP PACEART REMOTE DEVICE CHECK
Date Time Interrogation Session: 20230603000210
Implantable Pulse Generator Implant Date: 20210830

## 2021-09-03 ENCOUNTER — Ambulatory Visit (INDEPENDENT_AMBULATORY_CARE_PROVIDER_SITE_OTHER): Payer: Medicare Other

## 2021-09-03 DIAGNOSIS — R55 Syncope and collapse: Secondary | ICD-10-CM | POA: Diagnosis not present

## 2021-09-04 LAB — CUP PACEART REMOTE DEVICE CHECK
Date Time Interrogation Session: 20230606093018
Implantable Pulse Generator Implant Date: 20210830

## 2021-09-19 NOTE — Progress Notes (Signed)
Carelink Summary Report / Loop Recorder 

## 2021-09-26 DIAGNOSIS — I10 Essential (primary) hypertension: Secondary | ICD-10-CM | POA: Diagnosis not present

## 2021-09-26 DIAGNOSIS — E7800 Pure hypercholesterolemia, unspecified: Secondary | ICD-10-CM | POA: Diagnosis not present

## 2021-09-26 DIAGNOSIS — G20 Parkinson's disease: Secondary | ICD-10-CM | POA: Diagnosis not present

## 2021-09-26 DIAGNOSIS — E1169 Type 2 diabetes mellitus with other specified complication: Secondary | ICD-10-CM | POA: Diagnosis not present

## 2021-09-27 ENCOUNTER — Ambulatory Visit: Payer: Medicare Other | Admitting: Neurology

## 2021-09-27 DIAGNOSIS — I10 Essential (primary) hypertension: Secondary | ICD-10-CM | POA: Diagnosis not present

## 2021-09-27 DIAGNOSIS — E7800 Pure hypercholesterolemia, unspecified: Secondary | ICD-10-CM | POA: Diagnosis not present

## 2021-09-27 DIAGNOSIS — E1169 Type 2 diabetes mellitus with other specified complication: Secondary | ICD-10-CM | POA: Diagnosis not present

## 2021-09-27 DIAGNOSIS — E039 Hypothyroidism, unspecified: Secondary | ICD-10-CM | POA: Diagnosis not present

## 2021-09-27 DIAGNOSIS — G20 Parkinson's disease: Secondary | ICD-10-CM | POA: Diagnosis not present

## 2021-10-08 ENCOUNTER — Ambulatory Visit (INDEPENDENT_AMBULATORY_CARE_PROVIDER_SITE_OTHER): Payer: Medicare Other

## 2021-10-08 DIAGNOSIS — R55 Syncope and collapse: Secondary | ICD-10-CM

## 2021-10-08 LAB — CUP PACEART REMOTE DEVICE CHECK
Date Time Interrogation Session: 20230710085023
Implantable Pulse Generator Implant Date: 20210830

## 2021-10-10 ENCOUNTER — Other Ambulatory Visit: Payer: Self-pay | Admitting: Neurology

## 2021-10-12 ENCOUNTER — Telehealth: Payer: Self-pay | Admitting: Cardiovascular Disease

## 2021-10-12 DIAGNOSIS — I6521 Occlusion and stenosis of right carotid artery: Secondary | ICD-10-CM

## 2021-10-12 DIAGNOSIS — I739 Peripheral vascular disease, unspecified: Secondary | ICD-10-CM

## 2021-10-12 NOTE — Telephone Encounter (Signed)
Spoke to patient's daughter [name] she stated father usually has another doppler done when he has carotids done.Stated she would like to have both done at the same time.Advised I will place orders for lower ext and carotid dopplers.Scheduler will call back with appointment.

## 2021-10-12 NOTE — Telephone Encounter (Signed)
Daughter called wanting to get new orders for Vas Carotid testing or have current orders extended past 7/19 as they want to have testing pushed back to 08/23.  Please advise.

## 2021-10-14 ENCOUNTER — Other Ambulatory Visit: Payer: Self-pay

## 2021-10-14 ENCOUNTER — Emergency Department (HOSPITAL_COMMUNITY): Payer: Medicare Other

## 2021-10-14 ENCOUNTER — Encounter (HOSPITAL_COMMUNITY): Payer: Self-pay

## 2021-10-14 ENCOUNTER — Inpatient Hospital Stay (HOSPITAL_COMMUNITY)
Admission: EM | Admit: 2021-10-14 | Discharge: 2021-10-22 | DRG: 177 | Disposition: A | Discharge status: Home Health | Payer: Medicare Other | Attending: Internal Medicine | Admitting: Internal Medicine

## 2021-10-14 DIAGNOSIS — R140 Abdominal distension (gaseous): Secondary | ICD-10-CM | POA: Diagnosis not present

## 2021-10-14 DIAGNOSIS — Z7401 Bed confinement status: Secondary | ICD-10-CM | POA: Diagnosis not present

## 2021-10-14 DIAGNOSIS — F0280 Dementia in other diseases classified elsewhere without behavioral disturbance: Secondary | ICD-10-CM | POA: Diagnosis present

## 2021-10-14 DIAGNOSIS — E870 Hyperosmolality and hypernatremia: Secondary | ICD-10-CM | POA: Diagnosis present

## 2021-10-14 DIAGNOSIS — R1312 Dysphagia, oropharyngeal phase: Secondary | ICD-10-CM | POA: Diagnosis not present

## 2021-10-14 DIAGNOSIS — L89156 Pressure-induced deep tissue damage of sacral region: Secondary | ICD-10-CM | POA: Diagnosis present

## 2021-10-14 DIAGNOSIS — J690 Pneumonitis due to inhalation of food and vomit: Principal | ICD-10-CM | POA: Diagnosis present

## 2021-10-14 DIAGNOSIS — N179 Acute kidney failure, unspecified: Secondary | ICD-10-CM | POA: Diagnosis not present

## 2021-10-14 DIAGNOSIS — Z96641 Presence of right artificial hip joint: Secondary | ICD-10-CM | POA: Diagnosis present

## 2021-10-14 DIAGNOSIS — Z515 Encounter for palliative care: Secondary | ICD-10-CM | POA: Diagnosis not present

## 2021-10-14 DIAGNOSIS — G20 Parkinson's disease: Secondary | ICD-10-CM | POA: Diagnosis not present

## 2021-10-14 DIAGNOSIS — E1151 Type 2 diabetes mellitus with diabetic peripheral angiopathy without gangrene: Secondary | ICD-10-CM | POA: Diagnosis present

## 2021-10-14 DIAGNOSIS — R053 Chronic cough: Secondary | ICD-10-CM | POA: Diagnosis present

## 2021-10-14 DIAGNOSIS — K3189 Other diseases of stomach and duodenum: Secondary | ICD-10-CM | POA: Diagnosis not present

## 2021-10-14 DIAGNOSIS — Z833 Family history of diabetes mellitus: Secondary | ICD-10-CM

## 2021-10-14 DIAGNOSIS — Z79899 Other long term (current) drug therapy: Secondary | ICD-10-CM

## 2021-10-14 DIAGNOSIS — E43 Unspecified severe protein-calorie malnutrition: Secondary | ICD-10-CM | POA: Diagnosis not present

## 2021-10-14 DIAGNOSIS — I2510 Atherosclerotic heart disease of native coronary artery without angina pectoris: Secondary | ICD-10-CM | POA: Diagnosis present

## 2021-10-14 DIAGNOSIS — I10 Essential (primary) hypertension: Secondary | ICD-10-CM | POA: Diagnosis present

## 2021-10-14 DIAGNOSIS — R1084 Generalized abdominal pain: Secondary | ICD-10-CM | POA: Diagnosis not present

## 2021-10-14 DIAGNOSIS — R54 Age-related physical debility: Secondary | ICD-10-CM | POA: Diagnosis present

## 2021-10-14 DIAGNOSIS — K567 Ileus, unspecified: Secondary | ICD-10-CM | POA: Diagnosis present

## 2021-10-14 DIAGNOSIS — Z9842 Cataract extraction status, left eye: Secondary | ICD-10-CM

## 2021-10-14 DIAGNOSIS — J189 Pneumonia, unspecified organism: Secondary | ICD-10-CM | POA: Diagnosis not present

## 2021-10-14 DIAGNOSIS — R627 Adult failure to thrive: Secondary | ICD-10-CM | POA: Diagnosis present

## 2021-10-14 DIAGNOSIS — L89312 Pressure ulcer of right buttock, stage 2: Secondary | ICD-10-CM | POA: Diagnosis not present

## 2021-10-14 DIAGNOSIS — R404 Transient alteration of awareness: Secondary | ICD-10-CM | POA: Diagnosis not present

## 2021-10-14 DIAGNOSIS — E039 Hypothyroidism, unspecified: Secondary | ICD-10-CM | POA: Diagnosis present

## 2021-10-14 DIAGNOSIS — E785 Hyperlipidemia, unspecified: Secondary | ICD-10-CM | POA: Diagnosis present

## 2021-10-14 DIAGNOSIS — K5641 Fecal impaction: Secondary | ICD-10-CM | POA: Diagnosis not present

## 2021-10-14 DIAGNOSIS — R338 Other retention of urine: Secondary | ICD-10-CM | POA: Diagnosis not present

## 2021-10-14 DIAGNOSIS — E876 Hypokalemia: Secondary | ICD-10-CM | POA: Diagnosis present

## 2021-10-14 DIAGNOSIS — R109 Unspecified abdominal pain: Secondary | ICD-10-CM | POA: Diagnosis not present

## 2021-10-14 DIAGNOSIS — N390 Urinary tract infection, site not specified: Secondary | ICD-10-CM | POA: Diagnosis present

## 2021-10-14 DIAGNOSIS — Z87891 Personal history of nicotine dependence: Secondary | ICD-10-CM

## 2021-10-14 DIAGNOSIS — Z743 Need for continuous supervision: Secondary | ICD-10-CM | POA: Diagnosis not present

## 2021-10-14 DIAGNOSIS — Z823 Family history of stroke: Secondary | ICD-10-CM

## 2021-10-14 DIAGNOSIS — K6389 Other specified diseases of intestine: Secondary | ICD-10-CM | POA: Diagnosis not present

## 2021-10-14 DIAGNOSIS — R1030 Lower abdominal pain, unspecified: Secondary | ICD-10-CM

## 2021-10-14 DIAGNOSIS — D509 Iron deficiency anemia, unspecified: Secondary | ICD-10-CM | POA: Diagnosis not present

## 2021-10-14 DIAGNOSIS — M4186 Other forms of scoliosis, lumbar region: Secondary | ICD-10-CM | POA: Diagnosis not present

## 2021-10-14 DIAGNOSIS — Z681 Body mass index (BMI) 19 or less, adult: Secondary | ICD-10-CM

## 2021-10-14 DIAGNOSIS — E119 Type 2 diabetes mellitus without complications: Secondary | ICD-10-CM

## 2021-10-14 DIAGNOSIS — Z7189 Other specified counseling: Secondary | ICD-10-CM | POA: Diagnosis not present

## 2021-10-14 DIAGNOSIS — Z7982 Long term (current) use of aspirin: Secondary | ICD-10-CM

## 2021-10-14 DIAGNOSIS — M47816 Spondylosis without myelopathy or radiculopathy, lumbar region: Secondary | ICD-10-CM | POA: Diagnosis not present

## 2021-10-14 DIAGNOSIS — Z841 Family history of disorders of kidney and ureter: Secondary | ICD-10-CM

## 2021-10-14 DIAGNOSIS — L89159 Pressure ulcer of sacral region, unspecified stage: Secondary | ICD-10-CM | POA: Diagnosis present

## 2021-10-14 DIAGNOSIS — I739 Peripheral vascular disease, unspecified: Secondary | ICD-10-CM | POA: Diagnosis present

## 2021-10-14 DIAGNOSIS — A419 Sepsis, unspecified organism: Secondary | ICD-10-CM | POA: Diagnosis not present

## 2021-10-14 DIAGNOSIS — R7989 Other specified abnormal findings of blood chemistry: Secondary | ICD-10-CM | POA: Diagnosis present

## 2021-10-14 LAB — URINALYSIS, ROUTINE W REFLEX MICROSCOPIC
Bilirubin Urine: NEGATIVE
Glucose, UA: NEGATIVE mg/dL
Ketones, ur: NEGATIVE mg/dL
Nitrite: NEGATIVE
Protein, ur: NEGATIVE mg/dL
Specific Gravity, Urine: 1.008 (ref 1.005–1.030)
pH: 6 (ref 5.0–8.0)

## 2021-10-14 LAB — CBC WITH DIFFERENTIAL/PLATELET
Abs Immature Granulocytes: 0.12 10*3/uL — ABNORMAL HIGH (ref 0.00–0.07)
Basophils Absolute: 0 10*3/uL (ref 0.0–0.1)
Basophils Relative: 0 %
Eosinophils Absolute: 0 10*3/uL (ref 0.0–0.5)
Eosinophils Relative: 0 %
HCT: 42 % (ref 39.0–52.0)
Hemoglobin: 13.9 g/dL (ref 13.0–17.0)
Immature Granulocytes: 1 %
Lymphocytes Relative: 3 %
Lymphs Abs: 0.5 10*3/uL — ABNORMAL LOW (ref 0.7–4.0)
MCH: 32 pg (ref 26.0–34.0)
MCHC: 33.1 g/dL (ref 30.0–36.0)
MCV: 96.8 fL (ref 80.0–100.0)
Monocytes Absolute: 1 10*3/uL (ref 0.1–1.0)
Monocytes Relative: 6 %
Neutro Abs: 14.7 10*3/uL — ABNORMAL HIGH (ref 1.7–7.7)
Neutrophils Relative %: 90 %
Platelets: 183 10*3/uL (ref 150–400)
RBC: 4.34 MIL/uL (ref 4.22–5.81)
RDW: 14.6 % (ref 11.5–15.5)
WBC: 16.4 10*3/uL — ABNORMAL HIGH (ref 4.0–10.5)
nRBC: 0 % (ref 0.0–0.2)

## 2021-10-14 LAB — COMPREHENSIVE METABOLIC PANEL
ALT: 9 U/L (ref 0–44)
AST: 16 U/L (ref 15–41)
Albumin: 3.6 g/dL (ref 3.5–5.0)
Alkaline Phosphatase: 80 U/L (ref 38–126)
Anion gap: 14 (ref 5–15)
BUN: 35 mg/dL — ABNORMAL HIGH (ref 8–23)
CO2: 20 mmol/L — ABNORMAL LOW (ref 22–32)
Calcium: 9.4 mg/dL (ref 8.9–10.3)
Chloride: 106 mmol/L (ref 98–111)
Creatinine, Ser: 1.48 mg/dL — ABNORMAL HIGH (ref 0.61–1.24)
GFR, Estimated: 48 mL/min — ABNORMAL LOW (ref >60)
Glucose, Bld: 165 mg/dL — ABNORMAL HIGH (ref 70–99)
Potassium: 3.6 mmol/L (ref 3.5–5.1)
Sodium: 140 mmol/L (ref 135–145)
Total Bilirubin: 1.6 mg/dL — ABNORMAL HIGH (ref 0.3–1.2)
Total Protein: 7.2 g/dL (ref 6.5–8.1)

## 2021-10-14 LAB — TROPONIN I (HIGH SENSITIVITY)
Troponin I (High Sensitivity): 7 ng/L (ref <18)
Troponin I (High Sensitivity): 8 ng/L (ref <18)

## 2021-10-14 LAB — LACTIC ACID, PLASMA
Lactic Acid, Venous: 1.6 mmol/L (ref 0.5–1.9)
Lactic Acid, Venous: 2 mmol/L (ref 0.5–1.9)

## 2021-10-14 LAB — CBG MONITORING, ED: Glucose-Capillary: 166 mg/dL — ABNORMAL HIGH (ref 70–99)

## 2021-10-14 LAB — LIPASE, BLOOD: Lipase: 20 U/L (ref 11–51)

## 2021-10-14 MED ORDER — POLYETHYLENE GLYCOL 3350 17 G PO PACK
17.0000 g | PACK | Freq: Every day | ORAL | Status: DC
  Administered 2021-10-14: 17 g via ORAL
  Filled 2021-10-14: qty 1

## 2021-10-14 MED ORDER — LACTATED RINGERS IV SOLN: INTRAVENOUS | Status: AC

## 2021-10-14 MED ORDER — SENNOSIDES-DOCUSATE SODIUM 8.6-50 MG PO TABS
1.0000 | ORAL_TABLET | Freq: Two times a day (BID) | ORAL | Status: DC
  Administered 2021-10-14 – 2021-10-22 (×15): 1 via ORAL
  Filled 2021-10-14 (×15): qty 1

## 2021-10-14 MED ORDER — SODIUM CHLORIDE 0.9 % IV SOLN
1.0000 g | INTRAVENOUS | Status: DC
  Filled 2021-10-14: qty 10

## 2021-10-14 MED ORDER — LACTATED RINGERS IV BOLUS
1000.0000 mL | Freq: Once | INTRAVENOUS | Status: AC
Start: 2021-10-14 — End: 2021-10-14
  Administered 2021-10-14: 1000 mL via INTRAVENOUS

## 2021-10-14 MED ORDER — IOHEXOL 300 MG/ML  SOLN
100.0000 mL | Freq: Once | INTRAMUSCULAR | Status: AC | PRN
  Administered 2021-10-14: 100 mL via INTRAVENOUS

## 2021-10-14 MED ORDER — ACETAMINOPHEN 500 MG PO TABS
1000.0000 mg | ORAL_TABLET | Freq: Once | ORAL | Status: AC
  Administered 2021-10-14: 1000 mg via ORAL
  Filled 2021-10-14: qty 2

## 2021-10-14 MED ORDER — SODIUM CHLORIDE 0.9 % IV SOLN
1.0000 g | Freq: Once | INTRAVENOUS | Status: AC
  Administered 2021-10-14: 1 g via INTRAVENOUS
  Filled 2021-10-14: qty 10

## 2021-10-14 MED ORDER — ENOXAPARIN SODIUM 40 MG/0.4ML IJ SOSY
40.0000 mg | PREFILLED_SYRINGE | INTRAMUSCULAR | Status: DC
  Administered 2021-10-15 – 2021-10-22 (×7): 40 mg via SUBCUTANEOUS
  Filled 2021-10-14 (×8): qty 0.4

## 2021-10-14 MED ORDER — SODIUM CHLORIDE 0.9 % IV SOLN
500.0000 mg | Freq: Once | INTRAVENOUS | Status: AC
  Administered 2021-10-14: 500 mg via INTRAVENOUS
  Filled 2021-10-14: qty 5

## 2021-10-14 MED ORDER — SODIUM CHLORIDE 0.9 % IV SOLN
500.0000 mg | INTRAVENOUS | Status: DC
  Filled 2021-10-14: qty 5

## 2021-10-14 NOTE — ED Provider Notes (Signed)
[place] DEPT Provider Note   CSN: [id number] Arrival date & time: 10/14/21  1323     History  No chief complaint on file.   Rodney Love is a 76 y.o. 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  Per patient's wife and daughter at bedside patient has limited verbal responses at baseline.  On my physical exam patient complains of pain to palpation of abdomen.  Patient's wife and daughter reports that patient started having abdominal distention intermittently since Wednesday.  He has complained of some intermittent abdominal pain over that time.  They deny any fever, chills, constipation, diarrhea, blood in stool, vomiting, coughing.    HPI     Home Medications Prior to Admission medications   Medication Sig Start Date End Date Taking? Authorizing Provider  acetaminophen (TYLENOL) 500 MG tablet Take 500 mg by mouth every 6 (six) hours as needed for moderate pain.    [provider]  aspirin 81 MG EC tablet Take 81 mg by mouth daily.    [provider]  atorvastatin (LIPITOR) 10 MG tablet TAKE 1 TABLET(10 MG) BY MOUTH DAILY 08/06/21   Croitoru, Mihai, MD  carbidopa-levodopa (SINEMET CR) 50-200 MG tablet TAKE 1 TABLET BY MOUTH EVERY NIGHT AT BEDTIME 10/11/21   Cameron Sprang, MD  Carbidopa-Levodopa ER (SINEMET CR) 25-100 MG tablet controlled release 1.5 tablets at 7am/10am/1pm/4pm 06/28/21   Tat, Eustace Quail, DO  cholecalciferol (VITAMIN D3) 25 MCG (1000 UNIT) tablet Take 1,000 Units by mouth daily.    [provider]  Cyanocobalamin (VITAMIN B 12 PO) Take 1 tablet by mouth daily.    [provider]  ferrous sulfate 324 MG TBEC Take 324 mg by mouth daily at 6 (six) AM.    [provider]  guaiFENesin (MUCINEX) 600 MG 12 hr tablet Take 600 mg by mouth daily as  needed.    [provider]  metFORMIN (GLUCOPHAGE) 850 MG tablet Take 850 mg by mouth 3 (three) times a week. Mon/Wed/Fri    [provider]  [facility] ULTRA test strip  02/04/19   [provider]  Potassium 99 MG TABS Take 99 mg by mouth daily.    [provider]  senna (SENOKOT) 8.6 MG TABS tablet Take 1 tablet (8.6 mg total) by mouth 2 (two) times daily. 05/05/20   Eugenie Filler, MD  pramipexole (MIRAPEX) 0.125 MG tablet Take 0.125 mg by mouth at bedtime.  10/15/19  [provider]      Allergies    Patient has no known allergies.    Review of Systems   Review of Systems  Unable to perform ROS: Patient nonverbal    Physical Exam Updated Vital Signs BP 117/76 (BP Location: Right Arm)   Pulse (!) 140   Temp 98.3 F (36.8 C)   Resp 18   SpO2 98%  Physical Exam Vitals and nursing note reviewed.  Constitutional:      General: He is not in acute distress.    Appearance: He is not ill-appearing, toxic-appearing or diaphoretic.  HENT:     Head: Normocephalic.  Eyes:     General: No scleral icterus.       Right eye: No discharge.        Left eye: No discharge.  Cardiovascular:     Rate and Rhythm: Normal rate.  Pulmonary:  Effort: Pulmonary effort is normal. No tachypnea, bradypnea or respiratory distress.     Breath sounds: Normal breath sounds. No stridor.  Abdominal:     General: Abdomen is flat. There is no distension. There are no signs of injury.     Palpations: Abdomen is rigid. There is no mass or pulsatile mass.     Tenderness: There is abdominal tenderness. There is no right CVA tenderness, left CVA tenderness, guarding or rebound.     Comments: Firmness to lower abdomen.  Diffuse tenderness throughout abdomen.   Skin:    General: Skin is warm and dry.     Comments: Stage II pressure ulcer to right buttocks approximately 2 cm with no surrounding erythema, warmth, or purulent discharge.  Neurological:     General: No  focal deficit present.     Mental Status: He is alert.  Psychiatric:        Behavior: Behavior is cooperative.     ED Results / Procedures / Treatments   Labs (all labs ordered are listed, but only abnormal results are displayed) Labs Reviewed  COMPREHENSIVE METABOLIC PANEL - Abnormal; Notable for the following components:      Result Value   CO2 20 (*)    Glucose, Bld 165 (*)    BUN 35 (*)    Creatinine, Ser 1.48 (*)    Total Bilirubin 1.6 (*)    GFR, Estimated 48 (*)    All other components within normal limits  CBC WITH DIFFERENTIAL/PLATELET - Abnormal; Notable for the following components:   WBC 16.4 (*)    Neutro Abs 14.7 (*)    Lymphs Abs 0.5 (*)    Abs Immature Granulocytes 0.12 (*)    All other components within normal limits  URINALYSIS, ROUTINE W REFLEX MICROSCOPIC - Abnormal; Notable for the following components:   APPearance HAZY (*)    Hgb urine dipstick MODERATE (*)    Leukocytes,Ua LARGE (*)    Bacteria, UA RARE (*)    All other components within normal limits  CBG MONITORING, ED - Abnormal; Notable for the following components:   Glucose-Capillary 166 (*)    All other components within normal limits  URINE CULTURE  CULTURE, BLOOD (ROUTINE X 2)  CULTURE, BLOOD (ROUTINE X 2)  LIPASE, BLOOD  LACTIC ACID, PLASMA  LACTIC ACID, PLASMA  TROPONIN I (HIGH SENSITIVITY)  TROPONIN I (HIGH SENSITIVITY)    EKG None  Radiology CT ABDOMEN PELVIS W CONTRAST  Result Date: 10/14/2021 CLINICAL DATA:  Acute generalized abdominal pain. EXAM: CT ABDOMEN AND PELVIS WITH CONTRAST TECHNIQUE: Multidetector CT imaging of the abdomen and pelvis was performed using the standard protocol following bolus administration of intravenous contrast. RADIATION DOSE REDUCTION: This exam was performed according to the departmental dose-optimization program which includes automated exposure control, adjustment of the mA and/or kV according to patient size and/or use of iterative  reconstruction technique. CONTRAST:  18m OMNIPAQUE IOHEXOL 300 MG/ML  SOLN COMPARISON:  None Available. FINDINGS: Lower Chest: Infiltrate or atelectasis is seen in the posterior right lower lobe. Hepatobiliary: No hepatic masses identified. Gallbladder is unremarkable. No evidence of biliary ductal dilatation. Pancreas: No mass or inflammatory changes. Diffuse fatty replacement of pancreas noted. Spleen: Within normal limits in size and appearance. Adrenals/Urinary Tract: Severe renal vascular calcification noted bilaterally. 2 mm calculus noted in midpole of left kidney. No masses identified. No evidence of ureteral calculi or hydronephrosis. The urinary bladder is dilated and numerous tiny calculi are seen along the dependent wall of the  urinary bladder. Stomach/Bowel: Diffuse dilatation of small and large bowel is seen, which may be due to ileus or fecal impaction. Large amount of stool is seen distending the rectum. No evidence of bowel obstruction, inflammatory process or abnormal fluid collections. Vascular/Lymphatic: No pathologically enlarged lymph nodes. No acute vascular findings. Aortic atherosclerotic calcification incidentally noted. Reproductive: Visualization due to severe artifact from right hip prosthesis. Mildly enlarged prostate gland with mass effect on bladder base. Other:  None. Musculoskeletal:  No suspicious bone lesions identified. IMPRESSION: Large amount of stool distending the rectum, suspicious for fecal impaction. Diffuse dilatation of small and large bowel, which may be due to ileus or fecal impaction. Mildly enlarged prostate. Distended urinary bladder likely due to chronic bladder outlet obstruction. Numerous tiny bladder calculi. Tiny left renal calculus. No evidence of ureteral calculi or hydronephrosis. Infiltrate or atelectasis in posterior right lower lobe. Pneumonia cannot be excluded. Aortic Atherosclerosis (ICD10-I70.0). Electronically Signed   By: Marlaine Hind M.D.   On:  10/14/2021 16:40   DG Chest Portable 1 View  Result Date: 10/14/2021 CLINICAL DATA:  Sepsis EXAM: PORTABLE CHEST 1 VIEW COMPARISON:  04/28/2020 FINDINGS: Loop recorder device overlies the left chest. The heart size and mediastinal contours are within normal limits. Streaky right basilar interstitial opacity. Appearance suggestive of free intraperitoneal air with increased lucency under the right hemidiaphragm and centrally. The visualized skeletal structures are unremarkable. IMPRESSION: 1. Appearance suggestive of free intraperitoneal air. Recommend CT of the abdomen and pelvis for further assessment. 2. Streaky right basilar interstitial opacity, which may represent atelectasis versus infiltrate. These results were called by telephone at the time of interpretation on 10/14/2021 at 3:21 pm to provider [facility] , who verbally acknowledged these results. Electronically Signed   By: Davina Poke D.O.   On: 10/14/2021 15:23    Procedures Procedures    Medications Ordered in ED Medications  acetaminophen (TYLENOL) tablet 1,000 mg (has no administration in time range)  cefTRIAXone (ROCEPHIN) 1 g in sodium chloride 0.9 % 100 mL IVPB (has no administration in time range)  azithromycin (ZITHROMAX) 500 mg in sodium chloride 0.9 % 250 mL IVPB (has no administration in time range)  iohexol (OMNIPAQUE) 300 MG/ML solution 100 mL (100 mLs Intravenous Contrast Given 10/14/21 1617)    ED Course/ Medical Decision Making/ A&P                           Medical Decision Making Amount and/or Complexity of Data Reviewed Labs: ordered. Radiology: ordered.  Risk OTC drugs. Prescription drug management. Decision regarding hospitalization.   Alert 76 year old male male presents emergency department with a chief complaint of abdominal pain.  Information obtained from patient, patient's wife, and patient's daughter.  I reviewed patient's past medical records including previous prior notes, labs, and  imaging.  Patient has medical history as outlined in HPI was complicates his care.  Patient initially found to be tachycardic with temperature of 99.9 F rectally.  Concern for possible sepsis.  ED sepsis work-up initiated.  I personally viewed and interpreted patient's EKG.  Tracing shows achycardia.  Tracing showed ventricular rate of 180.  When pulse was palpated provider at bedside and reassess tachycardia with heart rate of 110.  Suspect that differences seen from artifact from patient's tremors.  I personally viewed and interpreted patient's chest x-ray.  Agree with radiology interpretation of streaky right basilar interstitial opacity which may represent atelectasis versus infiltrate.  Concern for possible free intraperitoneal air.  I was contacted by radiologist with reports of x-ray.  CT abdomen pelvis with contrast was ordered immediately after with plan to forego obtaining creatinine clearance due to concern for perforation.  I personally viewed interpret patient CT imaging.  Agree with radiology interpretation of large amount of stool distending the rectum, suspicious for fecal impaction.  Diffuse dilation of small and large bowel which may reflect ileus or fecal impaction.  Distended urinary bladder likely due to chronic bladder outlet obstruction.  Deficit or atelectasis in posterior right lower lobe.  I personally viewed interpret patient's lab results.  Pertinent findings include: -Leukocytosis 16.4 -AKI with BUN 35 and creatinine 1.48 -Urinalysis shows bacteria rare, WBC 11-20, RBC 6-10, leukocyte large, nitrite negative. -Troponin 7 -Lactic acid within normal limits  Due to CT imaging showing fecal impaction will attempt manual disimpaction.  On manual disimpaction no fecal impaction was noted.  Patient did have dark brown stool noted in rectal vault with no melena or frank red blood.  Suspect that patient's high stool burden is secondary to ileus.  Abdominal pain may be due to  high stool burden.  Suspect that patient's AKI may be secondary to obstruction.  Foley catheter placed due to earlier urinary retention.  After an catheterization repeat bladder scan showed 315 cc.  Due to concern for pneumonia we will start patient on azithromycin and ceftriaxone.  We will consult hospitalist team for admission.  I spoke to Dr. Flossie Buffy who will see the patient for admission.        Final Clinical Impression(s) / ED Diagnoses Final diagnoses:  Lower abdominal pain  Ileus ([place])  Community acquired pneumonia of right lower lobe of lung  Parkinson's disease [facility])    Rx / DC Orders ED Discharge Orders     None         Dyann Ruddle 10/14/21 1850    Luna Fuse, MD 10/23/21 1945

## 2021-10-14 NOTE — ED Notes (Signed)
Date and time results received: 10/14/21 1940 (use smartphrase ".now" to insert current time)  Test: lactic acid Critical Value: 2.0  Name of Provider Notified: ching  Orders Received? Or Actions Taken?

## 2021-10-14 NOTE — ED Triage Notes (Signed)
Per EMS, patient from home, abdominal distension x 4 days with pain and tenderness noted today. Patient is nonverbal, Parkinson's, dementia, bed bound. Family are caregivers and report increased incontinence with urinary frequency.  BP 122/80 HR 84 98 % RR 18 CBG 211

## 2021-10-14 NOTE — H&P (Signed)
History and Physical    Patient: Rodney Love YDX:[id number] DOB: 1946/01/04 DOA: 10/14/2021 DOS: the patient was seen and examined on 10/15/2021 PCP: Lujean Amel, MD  Patient coming from: Home  Chief Complaint:  Chief Complaint  Patient presents with   Abdominal Pain   HPI: Rodney Love is a 76 y.o. male with medical history significant of Parkinson's, type 2 diabetes, PAD, hypothyroidism and hyperlipidemia who presents with abdominal distention.  Pt is bedbound and mostly non-verbal due to Parkinson's. Wife is primary caretaker and provides history. She noticed an increase bulge to his right lower abdominal and was concerned. No nausea, vomiting or complaints of pain. Had loose stool this morning. Had wet brief this morning as well with urine.  He has chronic intermittent cough but has noticed that it has been worse in the past 2 days and productive.  No labored respirations.  In the ED, he was afebrile normotensive on room air. Had leukocytosis of 16.4, normal hemoglobin 13.9.  Has AKI with elevated creatinine of 1.48.  UA with large leukocyte, negative nitrite and rare bacteria. Chest x-ray was concerning for free intraperitoneal air.  Streaky right basilar interstitial opacity that may be atelectasis versus infiltrate. CT abdomen pelvis showing large amount of stool distending the abdomen possible fecal impaction.  Diffuse dilatation of small and large bowel which may be due to ileus. There is a distended urinary bladder likely due to chronic bladder outlet obstruction.  Infiltrate/atelectasis in the right lower lobe.  Pneumonia not excluded.  ED PA attempted manual disimpaction but was only able to get small amount of stool.  No large stool burden or hard stool ball was noted.  He was started on IV Rocephin and azithromycin for presumed community-acquired pneumonia.  Hospitalist then consulted for further management. Review of Systems: unable to review all systems due to the  inability of the patient to answer questions. Past Medical History:  Diagnosis Date   Arthritis    Constipation    OCCASIONAL   Coronary artery disease    Diabetes mellitus without complication ([place])    Environmental allergies    Hyperlipidemia    Hypertension    Inguinal hernia    Iron deficiency    Nocturia    Parkinson disease ([place])    Peripheral arterial disease ([place])    Past Surgical History:  Procedure Laterality Date   BACK SURGERY     CARDIAC CATHETERIZATION  05/29/2006   single vessel CAD w/moderate stenosis of the prox. RCA approx 40%,prox. LAD 20-30%,minor irreg. mid abdominal aorta,left iliac artery disease 50-60%   CATARACT EXTRACTION Left 12/2016   HERNIA REPAIR  [id number]   INGUINAL HERNIA REPAIR N/A 09/23/2012   Procedure: LAPAROSCOPIC INGUINAL HERNIA with umbilical hernia;  Surgeon: Madilyn Hook, DO;  Location: [facility];  Service: General;  Laterality: N/A;   INSERTION OF MESH Bilateral 09/23/2012   Procedure: INSERTION OF MESH;  Surgeon: Madilyn Hook, DO;  Location: [facility];  Service: General;  Laterality: Bilateral;   TOTAL HIP ARTHROPLASTY Right 04/29/2020   Procedure: TOTAL HIP ARTHROPLASTY ANTERIOR APPROACH;  Surgeon: Rod Can, MD;  Location: [facility];  Service: Orthopedics;  Laterality: Right;   Social History:  reports that he quit smoking about 30 years ago. His smoking use included cigarettes. He quit smokeless tobacco use about 30 years ago. He reports that he does not drink alcohol and does not use drugs.  No Known Allergies  Family History  Problem Relation Age of Onset   Diabetes Mother  Kidney disease Mother    Stroke Father    Stroke Brother    Cancer Sister    Cancer Sister    Diabetes Sister    Healthy Daughter    Healthy Daughter    Healthy Son     Prior to Admission medications   Medication Sig Start Date End Date Taking? Authorizing Provider  acetaminophen (TYLENOL) 500 MG tablet Take 500 mg by mouth every 6 (six) hours as needed  for moderate pain.   Yes [provider]  aspirin 81 MG EC tablet Take 81 mg by mouth daily.   Yes [provider]  atorvastatin (LIPITOR) 10 MG tablet TAKE 1 TABLET(10 MG) BY MOUTH DAILY Patient taking differently: Take 10 mg by mouth daily. 08/06/21  Yes Croitoru, Mihai, MD  carbidopa-levodopa (SINEMET CR) 50-200 MG tablet TAKE 1 TABLET BY MOUTH EVERY NIGHT AT BEDTIME 10/11/21  Yes Cameron Sprang, MD  carbidopa-levodopa (SINEMET IR) 25-100 MG tablet Take 1 tablet by mouth See admin instructions. Take 1 tablet by mouth at 7 AM, 10 AM, 1 PM, and 4 PM   Yes [provider]  cholecalciferol (VITAMIN D3) 25 MCG (1000 UNIT) tablet Take 1,000 Units by mouth daily.   Yes [provider]  Cyanocobalamin (VITAMIN B 12 PO) Take 1 tablet by mouth daily.   Yes [provider]  ferrous sulfate 324 MG TBEC Take 324 mg by mouth daily with breakfast.   Yes [provider]  guaiFENesin (MUCINEX) 600 MG 12 hr tablet Take 600 mg by mouth every 12 (twelve) hours as needed for to loosen phlegm or cough.   Yes [provider]  Potassium 99 MG TABS Take 99 mg by mouth daily.   Yes [provider]  senna (SENOKOT) 8.6 MG TABS tablet Take 1 tablet (8.6 mg total) by mouth 2 (two) times daily. Patient taking differently: Take 1 tablet by mouth 2 (two) times daily as needed for mild constipation. 05/05/20  Yes Eugenie Filler, MD  Carbidopa-Levodopa ER (SINEMET CR) 25-100 MG tablet controlled release 1.5 tablets at 7am/10am/1pm/4pm Patient not taking: Reported on 10/14/2021 06/28/21   Tat, Eustace Quail, DO  ONETOUCH ULTRA test strip  02/04/19   [provider]  pramipexole (MIRAPEX) 0.125 MG tablet Take 0.125 mg by mouth at bedtime.  10/15/19  [provider]    Physical Exam: Vitals:   10/15/21 0115 10/15/21 0130 10/15/21 0200 10/15/21 0215  BP: (!) 99/58  (!) 93/58 (!) 95/57  Pulse: (!) 120 (!) 58 (!) 110 (!) 105  Resp: '18 16 17 '$ (!) 22   Temp:      TempSrc:      SpO2: 93% 93% 97% 95%   Constitutional: NAD, calm, comfortable, chronically ill-appearing elderly gentleman lying flat in bed mostly sleeping with his mouth wide open.  He was able to speak briefly and cursed at his wife when she tried to give him water to drink to swallow his pills. Eyes:  lids and conjunctivae normal ENMT: Mucous membranes are moist..  Neck: normal, supple, Respiratory: clear to auscultation bilaterally, no wheezing, no crackles. Normal respiratory effort. No accessory muscle use.  Cardiovascular: Regular rate and rhythm, no murmurs / rubs / gallops. No extremity edema. .  Abdomen: Soft, nondistended, cachectic abdomen with no tenderness.  Lower abdomen does feel firm but likely due to his cachexia. No obvious mass palpated . Bowel sounds positive.  Musculoskeletal: no clubbing / cyanosis. No joint deformity upper and lower extremities.  Muscle  wasting on extremity.  Skin: no rashes, lesions, ulcers.  Neurologic: Alert and mostly nonverbal.  Able to move all extremities but has stiffness with difficulty in extension of the upper extremity. Psychiatric: Alert but mostly nonverbal  data Reviewed:  See HPI  Assessment and Plan: * AKI (acute kidney injury) ([place]) Creatinine elevated 1.48 from 0.71. -Continuous IV fluids overnight.  Follow repeat in the morning.  Acute urinary retention Foley catheter placed in ED for concerns of urinary retention although wife reports that he had urinated earlier this morning. -UA with large leukocyte, negative showed rare bacteria concerning for possible UTI.  He is on IV Rocephin and azithromycin already for pneumonia coverage. -RN later noticed heavy urinary sediment and was concerned with it being fecal material.  I discussed his radiological findings CT abdomen/pelvis further with radiologist Dr. Ellard Artis who verifies that is likely due to chronic layering sediment and small calculi in his urine.  No  intraperitoneal air was noted on CT.  This was seen on chest x-ray but is due to placement of colonic gastric distention. -urine culture is pending  Community acquired pneumonia Chest x-ray showed right basilar opacity concerning for possible infiltrate.  Wife also mentions increased cough in the past 2 days.  He is also high risk for aspiration due to his Parkinson's. -Continue IV Rocephin and azithromycin -speech evaluation- wife has been feeding him chopped up solids  Ileus ([facility]) Likely due to his Parkinson's disease.  Also has large stool burden seen on CT abdomen/pelvis.  ED PA attempted manual disimpaction but was only able to get small amount of stool.  No large stool burden or hard stool ball was noted. -Continue daily MiraLAX, senna -Wife declined any enema at this time since she feels patient will be agitated with any suppositories -Full Liquid diet for now for bowel rest  PD (Parkinson's disease) (HCC) -Continue Sinemet 50-200 at bedtime -Continue Sinemet CR 25-'100mg'$  1.5tabs 4 x daily - med rec does differ from last neurology note regarding dosage and whether he takes CR or IR formulation. Will need to verify with wife in the morning.  Diabetes mellitus type 2 in nonobese (HCC) No hyperglycermia. Will follow with morning labs for now      Advance Care Planning:   Code Status: Full Code   Consults: none  Family Communication: Discussed with wife at bedside  Severity of Illness: The appropriate patient status for this patient is INPATIENT. Inpatient status is judged to be reasonable and necessary in order to provide the required intensity of service to ensure the patient's safety. The patient's presenting symptoms, physical exam findings, and initial radiographic and laboratory data in the context of their chronic comorbidities is felt to place them at high risk for further clinical deterioration. Furthermore, it is not anticipated that the patient will be medically stable for  discharge from the hospital within 2 midnights of admission.   * I certify that at the point of admission it is my clinical judgment that the patient will require inpatient hospital care spanning beyond 2 midnights from the point of admission due to high intensity of service, high risk for further deterioration and high frequency of surveillance required.*  Author: Orene Desanctis, DO 10/15/2021 3:14 AM  For on call review [email or web address].

## 2021-10-15 ENCOUNTER — Other Ambulatory Visit: Payer: Self-pay

## 2021-10-15 ENCOUNTER — Inpatient Hospital Stay (HOSPITAL_COMMUNITY): Payer: Medicare Other

## 2021-10-15 DIAGNOSIS — E039 Hypothyroidism, unspecified: Secondary | ICD-10-CM | POA: Diagnosis present

## 2021-10-15 DIAGNOSIS — N179 Acute kidney failure, unspecified: Secondary | ICD-10-CM | POA: Diagnosis not present

## 2021-10-15 LAB — BASIC METABOLIC PANEL
Anion gap: 7 (ref 5–15)
BUN: 25 mg/dL — ABNORMAL HIGH (ref 8–23)
CO2: 25 mmol/L (ref 22–32)
Calcium: 9.1 mg/dL (ref 8.9–10.3)
Chloride: 107 mmol/L (ref 98–111)
Creatinine, Ser: 0.86 mg/dL (ref 0.61–1.24)
GFR, Estimated: >60 mL/min (ref >60)
Glucose, Bld: 118 mg/dL — ABNORMAL HIGH (ref 70–99)
Potassium: 3.3 mmol/L — ABNORMAL LOW (ref 3.5–5.1)
Sodium: 139 mmol/L (ref 135–145)

## 2021-10-15 LAB — CBC
HCT: 39.8 % (ref 39.0–52.0)
Hemoglobin: 13.1 g/dL (ref 13.0–17.0)
MCH: 32.2 pg (ref 26.0–34.0)
MCHC: 32.9 g/dL (ref 30.0–36.0)
MCV: 97.8 fL (ref 80.0–100.0)
Platelets: 158 10*3/uL (ref 150–400)
RBC: 4.07 MIL/uL — ABNORMAL LOW (ref 4.22–5.81)
RDW: 14.6 % (ref 11.5–15.5)
WBC: 9.5 10*3/uL (ref 4.0–10.5)
nRBC: 0 % (ref 0.0–0.2)

## 2021-10-15 LAB — URINE CULTURE

## 2021-10-15 LAB — MAGNESIUM: Magnesium: 2 mg/dL (ref 1.7–2.4)

## 2021-10-15 MED ORDER — ASPIRIN 81 MG PO TBEC
81.0000 mg | DELAYED_RELEASE_TABLET | Freq: Every day | ORAL | Status: DC
  Administered 2021-10-15 – 2021-10-22 (×8): 81 mg via ORAL
  Filled 2021-10-15 (×8): qty 1

## 2021-10-15 MED ORDER — CARBIDOPA-LEVODOPA ER 50-200 MG PO TBCR
1.0000 | EXTENDED_RELEASE_TABLET | Freq: Every day | ORAL | Status: DC
  Administered 2021-10-15 – 2021-10-21 (×7): 1 via ORAL
  Filled 2021-10-15 (×7): qty 1

## 2021-10-15 MED ORDER — POTASSIUM CHLORIDE 20 MEQ PO PACK
40.0000 meq | PACK | Freq: Once | ORAL | Status: DC
  Filled 2021-10-15: qty 2

## 2021-10-15 MED ORDER — ACETAMINOPHEN 500 MG PO TABS
500.0000 mg | ORAL_TABLET | Freq: Four times a day (QID) | ORAL | Status: DC | PRN
  Administered 2021-10-17: 500 mg via ORAL
  Filled 2021-10-15: qty 1

## 2021-10-15 MED ORDER — ATORVASTATIN CALCIUM 10 MG PO TABS
10.0000 mg | ORAL_TABLET | Freq: Every day | ORAL | Status: DC
  Administered 2021-10-15 – 2021-10-22 (×8): 10 mg via ORAL
  Filled 2021-10-15 (×8): qty 1

## 2021-10-15 MED ORDER — CHLORHEXIDINE GLUCONATE CLOTH 2 % EX PADS
6.0000 | MEDICATED_PAD | Freq: Every day | CUTANEOUS | Status: DC
Start: 2021-10-16 — End: 2021-10-22
  Administered 2021-10-16 – 2021-10-22 (×7): 6 via TOPICAL

## 2021-10-15 MED ORDER — GUAIFENESIN ER 600 MG PO TB12
600.0000 mg | ORAL_TABLET | Freq: Two times a day (BID) | ORAL | Status: DC | PRN
Start: 2021-10-15 — End: 2021-10-22

## 2021-10-15 MED ORDER — AMOXICILLIN-POT CLAVULANATE 875-125 MG PO TABS
1.0000 | ORAL_TABLET | Freq: Two times a day (BID) | ORAL | Status: DC
  Administered 2021-10-15 – 2021-10-16 (×2): 1 via ORAL
  Filled 2021-10-15 (×2): qty 1

## 2021-10-15 MED ORDER — POTASSIUM CHLORIDE CRYS ER 10 MEQ PO TBCR
10.0000 meq | EXTENDED_RELEASE_TABLET | Freq: Every day | ORAL | Status: DC
  Administered 2021-10-15: 10 meq via ORAL
  Filled 2021-10-15: qty 1

## 2021-10-15 MED ORDER — TAMSULOSIN HCL 0.4 MG PO CAPS
0.4000 mg | ORAL_CAPSULE | Freq: Every day | ORAL | Status: DC
  Administered 2021-10-15 – 2021-10-21 (×7): 0.4 mg via ORAL
  Filled 2021-10-15 (×7): qty 1

## 2021-10-15 MED ORDER — VITAMIN D 25 MCG (1000 UNIT) PO TABS
1000.0000 [IU] | ORAL_TABLET | Freq: Every day | ORAL | Status: DC
  Administered 2021-10-15 – 2021-10-22 (×8): 1000 [IU] via ORAL
  Filled 2021-10-15 (×8): qty 1

## 2021-10-15 MED ORDER — FERROUS SULFATE 325 (65 FE) MG PO TABS
325.0000 mg | ORAL_TABLET | Freq: Every day | ORAL | Status: DC
  Administered 2021-10-15 – 2021-10-22 (×8): 325 mg via ORAL
  Filled 2021-10-15 (×8): qty 1

## 2021-10-15 MED ORDER — CARBIDOPA-LEVODOPA ER 25-100 MG PO TBCR
1.5000 | EXTENDED_RELEASE_TABLET | Freq: Four times a day (QID) | ORAL | Status: DC
Start: 2021-10-15 — End: 2021-10-22
  Administered 2021-10-15 – 2021-10-22 (×28): 1.5 via ORAL
  Filled 2021-10-15 (×2): qty 1.5
  Filled 2021-10-15 (×5): qty 2
  Filled 2021-10-15 (×2): qty 1.5
  Filled 2021-10-15 (×3): qty 2
  Filled 2021-10-15: qty 1.5
  Filled 2021-10-15: qty 2
  Filled 2021-10-15: qty 1.5
  Filled 2021-10-15: qty 1
  Filled 2021-10-15 (×2): qty 2
  Filled 2021-10-15: qty 1.5
  Filled 2021-10-15: qty 2
  Filled 2021-10-15: qty 1.5
  Filled 2021-10-15: qty 2
  Filled 2021-10-15: qty 1.5
  Filled 2021-10-15: qty 2
  Filled 2021-10-15 (×2): qty 1.5
  Filled 2021-10-15 (×2): qty 2
  Filled 2021-10-15: qty 1.5
  Filled 2021-10-15 (×2): qty 2
  Filled 2021-10-15: qty 1.5

## 2021-10-15 MED ORDER — POLYETHYLENE GLYCOL 3350 17 G PO PACK
17.0000 g | PACK | Freq: Two times a day (BID) | ORAL | Status: DC
Start: 2021-10-15 — End: 2021-10-22
  Administered 2021-10-19 – 2021-10-22 (×6): 17 g via ORAL
  Filled 2021-10-15 (×12): qty 1

## 2021-10-15 NOTE — Progress Notes (Signed)
PROGRESS NOTE    NAZAIAH Love  CZY:[id number] DOB: [birth year] DOA: 10/14/2021 PCP: Lujean Amel, MD     Brief Narrative:   From admission h and p ohn S Love is a 76 y.o. male with medical history significant of Parkinson's, type 2 diabetes, PAD, hypothyroidism and hyperlipidemia who presents with abdominal distention.   Pt is bedbound and mostly non-verbal due to Parkinson's. Wife is primary caretaker and provides history. She noticed an increase bulge to his right lower abdominal and was concerned. No nausea, vomiting or complaints of pain. Had loose stool this morning. Had wet brief this morning as well with urine.  He has chronic intermittent cough but has noticed that it has been worse in the past 2 days and productive.  No labored respirations.   In the ED, he was afebrile normotensive on room air. Had leukocytosis of 16.4, normal hemoglobin 13.9.  Has AKI with elevated creatinine of 1.48.   UA with large leukocyte, negative nitrite and rare bacteria. Chest x-ray was concerning for free intraperitoneal air.  Streaky right basilar interstitial opacity that may be atelectasis versus infiltrate. CT abdomen pelvis showing large amount of stool distending the abdomen possible fecal impaction.  Diffuse dilatation of small and large bowel which may be due to ileus. There is a distended urinary bladder likely due to chronic bladder outlet obstruction.  Infiltrate/atelectasis in the right lower lobe.  Pneumonia not excluded.   ED PA attempted manual disimpaction but was only able to get small amount of stool.  No large stool burden or hard stool ball was noted.   He was started on IV Rocephin and azithromycin for presumed community-acquired pneumonia.  Hospitalist then consulted for further management.   Assessment & Plan:   Principal Problem:   AKI (acute kidney injury) ([place]) Active Problems:   PAD (peripheral artery disease) s/p remote left external iliac artery angioplasty    Essential hypertension   Diabetes mellitus type 2 in nonobese (HCC)   PD (Parkinson's disease) ([place])   Ileus ([place])   Community acquired pneumonia   Acute urinary retention   Acquired hypothyroidism  AKI (acute kidney injury) ([place]) Resolved with fluids   Acute urinary retention Foley catheter placed in ED for concerns of urinary retention although wife reports that he had urinated earlier this morning. -UA with large leukocyte, negative showed rare bacteria concerning for possible UTI. Culture with multiple species. S/p ceftriaxone as below, now on augmentin - mobility is poor and family says lots of trouble getting him to appointments. They really want to see if we can discontinue the foley. Thus will d/c foley and start flomax. However if unable to void will need to be replaced   Community acquired pneumonia Chest x-ray showed right basilar opacity concerning for possible infiltrate.  Wife also mentions increased cough in the past 2 days.  He is also high risk for aspiration due to his Parkinson's. -stop ceftriaxone/azithromycin, start augmentin -speech evaluation- wife has been feeding him chopped up solids   Constipation ([place]) Likely due to his Parkinson's disease.  Also has large stool burden seen on CT abdomen/pelvis.  ED PA attempted manual disimpaction but was only able to get small amount of stool.  No large stool burden or hard stool ball was noted. -Continue daily MiraLAX, senna -Wife declined any enema at this time since she feels patient will be agitated with any suppositories   PD (Parkinson's disease) ([place]) -Continue Sinemet 50-200 at bedtime -Continue Sinemet CR 25-'100mg'$  1.5tabs 4 x daily -  med rec does differ from last neurology note regarding dosage and whether he takes CR or IR formulation. Will need to verify with wife in the morning. - family interested in home health services. Pt/ot consulted   Diabetes mellitus type 2 in nonobese (HCC) No hyperglycermia. Will  follow with morning labs for now   DVT prophylaxis: lovenox Code Status: full Family Communication: wife updated @ bedside  Level of care: Telemetry Status is: Inpatient Remains inpatient appropriate because: ongoing evaluation    Consultants:  none  Procedures: none  Antimicrobials:  See above    Subjective: This morning no pain, mild cough  Objective: Vitals:   10/15/21 0425 10/15/21 0425 10/15/21 0556 10/15/21 1002  BP: '94/61 94/61 90/66 '$ (!) 81/55  Pulse:  98 100 68  Resp:  '18 16 17  '$ Temp:  (!) 97 F (36.1 C) 97.6 F (36.4 C) 98.1 F (36.7 C)  TempSrc:  Axillary Oral Oral  SpO2:  97% 97% 92%  Weight:   58.3 kg   Height:   '6\' 1"'$  (1.854 m)     Intake/Output Summary (Last 24 hours) at 10/15/2021 1704 Last data filed at 10/15/2021 1521 Gross per 24 hour  Intake 2167.68 ml  Output 1800 ml  Net 367.68 ml   Filed Weights   10/15/21 0556  Weight: 58.3 kg    Examination:  Constitutional: NAD, calm, comfortable, chronically ill-appearing elderly gentleman ENMT: Mucous membranes are moist..  Respiratory: clear to auscultation bilaterally, no wheezing, no crackles. Normal respiratory effort. No accessory muscle use.  Cardiovascular: Regular rate and rhythm, no murmurs / rubs / gallops. No extremity edema. .  Abdomen: Soft, nondistended, nontender Musculoskeletal: no clubbing / cyanosis. No joint deformity upper and lower extremities.  Muscle wasting on extremity.  Skin: no rashes, lesions, ulcers.  Neurologic: Alert and mostly nonverbal.  Able to move all extremities but has stiffness with difficulty in extension of the upper extremity. Psychiatric: Alert but mostly nonverbal     Data Reviewed: I have personally reviewed following labs and imaging studies  CBC: Recent Labs  Lab 10/14/21 1343 10/15/21 0543  WBC 16.4* 9.5  NEUTROABS 14.7*  --   HGB 13.9 13.1  HCT 42.0 39.8  MCV 96.8 97.8  PLT 183 784   Basic Metabolic Panel: Recent Labs  Lab  10/14/21 1343 10/15/21 0543  NA 140 139  K 3.6 3.3*  CL 106 107  CO2 20* 25  GLUCOSE 165* 118*  BUN 35* 25*  CREATININE 1.48* 0.86  CALCIUM 9.4 9.1  MG  --  2.0   GFR: Estimated Creatinine Clearance: 58.4 mL/min (by C-G formula based on SCr of 0.86 mg/dL). Liver Function Tests: Recent Labs  Lab 10/14/21 1343  AST 16  ALT 9  ALKPHOS 80  BILITOT 1.6*  PROT 7.2  ALBUMIN 3.6   Recent Labs  Lab 10/14/21 1343  LIPASE 20   No results for input(s): "AMMONIA" in the last 168 hours. Coagulation Profile: No results for input(s): "INR", "PROTIME" in the last 168 hours. Cardiac Enzymes: No results for input(s): "CKTOTAL", "CKMB", "CKMBINDEX", "TROPONINI" in the last 168 hours. BNP (last 3 results) No results for input(s): "PROBNP" in the last 8760 hours. HbA1C: No results for input(s): "HGBA1C" in the last 72 hours. CBG: Recent Labs  Lab 10/14/21 1407  GLUCAP 166*   Lipid Profile: No results for input(s): "CHOL", "HDL", "LDLCALC", "TRIG", "CHOLHDL", "LDLDIRECT" in the last 72 hours. Thyroid Function Tests: No results for input(s): "TSH", "T4TOTAL", "FREET4", "T3FREE", "THYROIDAB" in  the last 72 hours. Anemia Panel: No results for input(s): "VITAMINB12", "FOLATE", "FERRITIN", "TIBC", "IRON", "RETICCTPCT" in the last 72 hours. Urine analysis:    Component Value Date/Time   COLORURINE YELLOW 10/14/2021 1343   APPEARANCEUR HAZY (A) 10/14/2021 1343   LABSPEC 1.008 10/14/2021 1343   PHURINE 6.0 10/14/2021 1343   GLUCOSEU NEGATIVE 10/14/2021 1343   HGBUR MODERATE (A) 10/14/2021 1343   BILIRUBINUR NEGATIVE 10/14/2021 1343   KETONESUR NEGATIVE 10/14/2021 1343   PROTEINUR NEGATIVE 10/14/2021 1343   NITRITE NEGATIVE 10/14/2021 1343   LEUKOCYTESUR LARGE (A) 10/14/2021 1343   Sepsis Labs: '@LABRCNTIP'$ (procalcitonin:4,lacticidven:4)  ) Recent Results (from the past 240 hour(s))  Urine Culture     Status: Abnormal   Collection Time: 10/14/21  1:43 PM   Specimen: Urine,  Clean Catch  Result Value Ref Range Status   Specimen Description   Final    URINE, CLEAN CATCH Performed at [facility], [place] [address] [place], [place] [address]    Special Requests   Final    NONE Performed at [facility], [place] [address] [place], [place] [address]    Culture MULTIPLE SPECIES PRESENT, SUGGEST RECOLLECTION (A)  Final   Report Status 10/15/2021 FINAL  Final  Blood culture (routine x 2)     Status: None (Preliminary result)   Collection Time: 10/14/21  1:45 PM   Specimen: BLOOD  Result Value Ref Range Status   Specimen Description   Final    BLOOD BLOOD LEFT FOREARM Performed at [facility] [address] [place], [place] [address]    Special Requests   Final    BOTTLES DRAWN AEROBIC AND ANAEROBIC Blood Culture adequate volume Performed at [facility] [address], [place], [place] [address]    Culture   Final    NO GROWTH < 24 HOURS Performed at [facility] [address] [place], [place] [address]    Report Status PENDING  Incomplete  Blood culture (routine x 2)     Status: None (Preliminary result)   Collection Time: 10/14/21  5:53 PM   Specimen: BLOOD  Result Value Ref Range Status   Specimen Description BLOOD RIGHT FOREARM  Final   Special Requests   Final    BOTTLES DRAWN AEROBIC AND ANAEROBIC Blood Culture results may not be optimal due to an excessive volume of blood received in culture bottles   Culture   Final    NO GROWTH < 12 HOURS Performed at [facility], [place] [address] [place],  [address]    Report Status PENDING  Incomplete         Radiology Studies: DG Swallowing Func-Speech Pathology  Result Date: 10/15/2021 Table formatting from the original result was not included. Objective Swallowing Evaluation: Type of Study: MBS-Modified Barium Swallow Study  Patient Details Name: LATHAM KINZLER MRN: [id number] Date of Birth: February 18, 1946  Today's Date: 10/15/2021 Time: SLP Start Time (ACUTE ONLY): 68 -SLP Stop Time (ACUTE ONLY): 1856 SLP Time Calculation (min) (ACUTE ONLY): 16 min Past Medical History: Past Medical History: Diagnosis Date  Arthritis   Constipation   OCCASIONAL  Coronary artery disease   Diabetes mellitus without complication ([place])   Environmental allergies   Hyperlipidemia   Hypertension   Inguinal hernia   Iron deficiency   Nocturia   Parkinson disease ([place])   Peripheral arterial disease ([place])  Past Surgical History: Past Surgical History: Procedure Laterality Date  BACK SURGERY  CARDIAC CATHETERIZATION  05/29/2006  single vessel CAD w/moderate stenosis of the prox. RCA approx 40%,prox. LAD 20-30%,minor irreg. mid abdominal aorta,left iliac artery disease 50-60%  CATARACT EXTRACTION Left 12/2016  HERNIA REPAIR  [id number]  INGUINAL HERNIA REPAIR N/A 09/23/2012  Procedure: LAPAROSCOPIC INGUINAL HERNIA with umbilical hernia;  Surgeon: Madilyn Hook, DO;  Location: [facility];  Service: General;  Laterality: N/A;  INSERTION OF MESH Bilateral 09/23/2012  Procedure: INSERTION OF MESH;  Surgeon: Madilyn Hook, DO;  Location: [facility];  Service: General;  Laterality: Bilateral;  TOTAL HIP ARTHROPLASTY Right 04/29/2020  Procedure: TOTAL HIP ARTHROPLASTY ANTERIOR APPROACH;  Surgeon: Rod Can, MD;  Location: [facility];  Service: Orthopedics;  Laterality: Right; HPI: Rodney Love is a 76 y.o. male with medical history significant of Parkinson's, type 2 diabetes, PAD, hypothyroidism and hyperlipidemia who presents with abdominal distention, bedbound and mostly non-verbal due to Parkinson's. per chart. Pt has chronic cough but has noticed that it has been worse in the past 2 days and productive. Found to have AKI, urinary retention, pneumonia and suspected ileus with full liquid diet (MD stated does not suspect he has an ileus).  No data recorded  Recommendations for follow up therapy are one component of a multi-disciplinary discharge planning  process, led by the attending physician.  Recommendations may be updated based on patient status, additional functional criteria and insurance authorization. Assessment / Plan / Recommendation   10/15/2021   2:55 PM Clinical Impressions Clinical Impression Pt presents with mild-moderate oropharyngeal dysphagia with silent aspiration of thin barium. Upper denture in place and lower plate in pt's room. There was intermittent prolonged propulsion with most consistencies with lingual residue at times. Pharyngeal phase marked by reduced tongue base retraction, decreased pharyngeal contraction and late laryngeal closure resulting in mild-max vallecular residue across textures. Thin barium entered vestibule prior to laryngeal closure and fell into trachea with only slight delayed and ineffective throat clear. There was one instance of minimal penetration with nectar and given residual volume, honey thick not given. A chin tuck to decrease vallecular residue was effective. Pt is at higher opportunity for dysphagia given Parkinson's and is exacerbated with current illness. Results discussed with pt, wife and daughter who voiced understanding of recommendation of nectar thick liquids and puree texture given reduced endurance. SLP will follow and observe pt with higher food texture once lower denture donned once improvements seen in overall endurance. SLP Visit Diagnosis Dysphagia, oropharyngeal phase (R13.12) Impact on safety and function Moderate aspiration risk     10/15/2021   2:55 PM Treatment Recommendations Treatment Recommendations Therapy as outlined in treatment plan below     10/15/2021   2:55 PM Prognosis Prognosis for Safe Diet Advancement Good Barriers to Reach Goals --   10/15/2021   2:55 PM Diet Recommendations SLP Diet Recommendations Dysphagia 1 (Puree) solids;Nectar thick liquid Liquid Administration via Straw;Cup Medication Administration Crushed with puree Compensations Slow rate;Small sips/bites;Clear throat  intermittently;Multiple dry swallows after each bite/sip Postural Changes Seated upright at 90 degrees     10/15/2021   2:55 PM Other Recommendations Oral Care Recommendations Oral care BID Follow Up Recommendations -- Assistance recommended at discharge Frequent or constant Supervision/Assistance Functional Status Assessment Patient has had a recent decline in their functional status and demonstrates the ability to make significant improvements in function in a reasonable and predictable amount of time.   10/15/2021   2:55 PM Frequency and Duration  Speech Therapy Frequency (ACUTE ONLY) min 2x/week Treatment Duration 2 weeks  10/15/2021   2:55 PM Oral Phase Oral Phase Impaired Oral - Nectar Cup Lingual/palatal residue;Delayed oral transit Oral - Nectar Straw Delayed oral transit;Lingual/palatal residue Oral - Thin Cup Delayed oral transit Oral - Puree Lingual/palatal residue;Delayed oral transit Oral - Regular Delayed oral transit;Reduced posterior propulsion    10/15/2021   2:55 PM Pharyngeal Phase Pharyngeal Phase Impaired Pharyngeal- Nectar Cup Delayed swallow initiation-vallecula;Penetration/Aspiration during swallow;Pharyngeal residue - valleculae;Pharyngeal residue - pyriform;Reduced tongue base retraction;Reduced pharyngeal peristalsis Pharyngeal Material enters airway, remains ABOVE vocal cords and not ejected out;Material does not enter airway Pharyngeal- Nectar Straw Pharyngeal residue - valleculae;Pharyngeal residue - pyriform;Reduced pharyngeal peristalsis;Reduced tongue base retraction Pharyngeal- Thin Cup Penetration/Aspiration during swallow;Reduced tongue base retraction;Reduced pharyngeal peristalsis;Pharyngeal residue - valleculae;Pharyngeal residue - pyriform Pharyngeal Material enters airway, passes BELOW cords without attempt by patient to eject out (silent aspiration) Pharyngeal- Puree Delayed swallow initiation-vallecula;Pharyngeal residue - valleculae;Pharyngeal residue - pyriform;Reduced  tongue base retraction;Reduced pharyngeal peristalsis Pharyngeal- Regular Pharyngeal residue - valleculae;Reduced laryngeal elevation    10/15/2021   2:55 PM Cervical Esophageal Phase  Cervical Esophageal Phase WFL [name] 10/15/2021, 3:15 PM                     CT ABDOMEN PELVIS W CONTRAST  Result Date: 10/14/2021 CLINICAL DATA:  Acute generalized abdominal pain. EXAM: CT ABDOMEN AND PELVIS WITH CONTRAST TECHNIQUE: Multidetector CT imaging of the abdomen and pelvis was performed using the standard protocol following bolus administration of intravenous contrast. RADIATION DOSE REDUCTION: This exam was performed according to the departmental dose-optimization program which includes automated exposure control, adjustment of the mA and/or kV according to patient size and/or use of iterative reconstruction technique. CONTRAST:  159m OMNIPAQUE IOHEXOL 300 MG/ML  SOLN COMPARISON:  None Available. FINDINGS: Lower Chest: Infiltrate or atelectasis is seen in the posterior right lower lobe. Hepatobiliary: No hepatic masses identified. Gallbladder is unremarkable. No evidence of biliary ductal dilatation. Pancreas: No mass or inflammatory changes. Diffuse fatty replacement of pancreas noted. Spleen: Within normal limits in size and appearance. Adrenals/Urinary Tract: Severe renal vascular calcification noted bilaterally. 2 mm calculus noted in midpole of left kidney. No masses identified. No evidence of ureteral calculi or hydronephrosis. The urinary bladder is dilated and numerous tiny calculi are seen along the dependent wall of the urinary bladder. Stomach/Bowel: Diffuse dilatation of small and large bowel is seen, which may be due to ileus or fecal impaction. Large amount of stool is seen distending the rectum. No evidence of bowel obstruction, inflammatory process or abnormal fluid collections. Vascular/Lymphatic: No pathologically enlarged lymph nodes. No acute vascular findings. Aortic atherosclerotic  calcification incidentally noted. Reproductive: Visualization due to severe artifact from right hip prosthesis. Mildly enlarged prostate gland with mass effect on bladder base. Other:  None. Musculoskeletal:  No suspicious bone lesions identified. IMPRESSION: Large amount of stool distending the rectum, suspicious for fecal impaction. Diffuse dilatation of small and large bowel, which may be due to ileus or fecal impaction. Mildly enlarged prostate. Distended urinary bladder likely due to chronic bladder outlet obstruction. Numerous tiny bladder calculi. Tiny left renal calculus. No evidence of ureteral calculi or hydronephrosis. Infiltrate or atelectasis in posterior right lower lobe. Pneumonia cannot be excluded. Aortic Atherosclerosis (ICD10-I70.0). Electronically Signed   By: JMarlaine HindM.D.   On: 10/14/2021 16:40   DG Chest Portable 1 View  Result Date: 10/14/2021 CLINICAL DATA:  Sepsis EXAM: PORTABLE CHEST 1 VIEW COMPARISON:  04/28/2020 FINDINGS: Loop recorder device overlies the left chest. The heart size  and mediastinal contours are within normal limits. Streaky right basilar interstitial opacity. Appearance suggestive of free intraperitoneal air with increased lucency under the right hemidiaphragm and centrally. The visualized skeletal structures are unremarkable. IMPRESSION: 1. Appearance suggestive of free intraperitoneal air. Recommend CT of the abdomen and pelvis for further assessment. 2. Streaky right basilar interstitial opacity, which may represent atelectasis versus infiltrate. These results were called by telephone at the time of interpretation on 10/14/2021 at 3:21 pm to provider [facility] , who verbally acknowledged these results. Electronically Signed   By: Davina Poke D.O.   On: 10/14/2021 15:23        Scheduled Meds:  aspirin EC  81 mg Oral Daily   atorvastatin  10 mg Oral Daily   carbidopa-levodopa  1 tablet Oral QHS   Carbidopa-Levodopa ER  1.5 tablet Oral QID    cholecalciferol  1,000 Units Oral Daily   enoxaparin (LOVENOX) injection  40 mg Subcutaneous Q24H   ferrous sulfate  325 mg Oral Q breakfast   polyethylene glycol  17 g Oral BID   potassium chloride  10 mEq Oral Daily   potassium chloride  40 mEq Oral Once   senna-docusate  1 tablet Oral BID   Continuous Infusions:  azithromycin     cefTRIAXone (ROCEPHIN)  IV       LOS: 1 day     Desma Maxim, MD [facility]   If 7PM-7AM, please contact night-coverage [email or web address] Password [facility] 10/15/2021, 5:04 PM

## 2021-10-15 NOTE — Assessment & Plan Note (Addendum)
Chest x-ray showed right basilar opacity concerning for possible infiltrate.  Wife also mentions increased cough in the past 2 days.  He is also high risk for aspiration due to his Parkinson's. -Continue IV Rocephin and azithromycin -speech evaluation- wife has been feeding him chopped up solids

## 2021-10-15 NOTE — Evaluation (Signed)
Clinical/Bedside Swallow Evaluation Patient Details  Name: Rodney Love MRN: [id number] Date of Birth: 19-Apr-1945  Today's Date: 10/15/2021 Time: SLP Start Time (ACUTE ONLY): 0917 SLP Stop Time (ACUTE ONLY): 0936 SLP Time Calculation (min) (ACUTE ONLY): 19 min  Past Medical History:  Past Medical History:  Diagnosis Date   Arthritis    Constipation    OCCASIONAL   Coronary artery disease    Diabetes mellitus without complication ([place])    Environmental allergies    Hyperlipidemia    Hypertension    Inguinal hernia    Iron deficiency    Nocturia    Parkinson disease ([place])    Peripheral arterial disease ([place])    Past Surgical History:  Past Surgical History:  Procedure Laterality Date   BACK SURGERY     CARDIAC CATHETERIZATION  05/29/2006   single vessel CAD w/moderate stenosis of the prox. RCA approx 40%,prox. LAD 20-30%,minor irreg. mid abdominal aorta,left iliac artery disease 50-60%   CATARACT EXTRACTION Left 12/2016   HERNIA REPAIR  [id number]   INGUINAL HERNIA REPAIR N/A 09/23/2012   Procedure: LAPAROSCOPIC INGUINAL HERNIA with umbilical hernia;  Surgeon: Madilyn Hook, DO;  Location: [facility];  Service: General;  Laterality: N/A;   INSERTION OF MESH Bilateral 09/23/2012   Procedure: INSERTION OF MESH;  Surgeon: Madilyn Hook, DO;  Location: [facility];  Service: General;  Laterality: Bilateral;   TOTAL HIP ARTHROPLASTY Right 04/29/2020   Procedure: TOTAL HIP ARTHROPLASTY ANTERIOR APPROACH;  Surgeon: Rod Can, MD;  Location: [facility];  Service: Orthopedics;  Laterality: Right;   HPI:  Rodney Love is a 76 y.o. male with medical history significant of Parkinson's, type 2 diabetes, PAD, hypothyroidism and hyperlipidemia who presents with abdominal distention, bedbound and mostly non-verbal due to Parkinson's. per chart. Pt has chronic cough but has noticed that it has been worse in the past 2 days and productive. Found to have AKI, urinary retention, pneumonia and ileus with full  liquid diet.    Assessment / Plan / Recommendation  Clinical Impression  Pt has baseline congested cough and signs of aspiration as well consistent immediately after thin liquids. He has no lower dentition but upper plate with adequate oral-motor movement. Observed drinking from water bottle with spout (uses at home) and cup with straw. + cough noted following trials (immediate and delayed) on top of baseline cough with pna. Recommend MBS and confirmed with MD that he may have barium. MD stated he does not suspect pt has an ileus and cleared for MBS. Study will be this afternoon at 12 or 1:00 (ST will notify RN). SLP Visit Diagnosis: Dysphagia, unspecified (R13.10)    Aspiration Risk  Moderate aspiration risk    Diet Recommendation Thin liquid;Other (Comment) (full liquids dper MD)   Liquid Administration via: Straw;Cup Medication Administration: Crushed with puree Supervision: Staff to assist with self feeding Compensations: Slow rate;Small sips/bites Postural Changes: Seated upright at 90 degrees    Other  Recommendations Oral Care Recommendations: Oral care BID    Recommendations for follow up therapy are one component of a multi-disciplinary discharge planning process, led by the attending physician.  Recommendations may be updated based on patient status, additional functional criteria and insurance authorization.  Follow up Recommendations        Assistance Recommended at Discharge    Functional Status Assessment    Frequency and Duration            Prognosis        Swallow Study  General Date of Onset: 10/14/21 HPI: Rodney Love is a 76 y.o. male with medical history significant of Parkinson's, type 2 diabetes, PAD, hypothyroidism and hyperlipidemia who presents with abdominal distention, bedbound and mostly non-verbal due to Parkinson's. per chart. Pt has chronic cough but has noticed that it has been worse in the past 2 days and productive. Found to have AKI,  urinary retention, pneumonia and ileus with full liquid diet. Type of Study: Bedside Swallow Evaluation Previous Swallow Assessment:  (none- seen outpt for Parkinsons) Diet Prior to this Study: Other (Comment);Thin liquids (full liquids due to ileus) Temperature Spikes Noted: No Respiratory Status: Room air History of Recent Intubation: No Behavior/Cognition: Alert;Cooperative;Pleasant mood;Requires cueing Oral Cavity Assessment: Dry Oral Care Completed by SLP: No Oral Cavity - Dentition: Dentures, top;Other (Comment);Missing dentition (no lower) Vision: Functional for self-feeding Self-Feeding Abilities: Needs assist Patient Positioning: Upright in bed Baseline Vocal Quality: Low vocal intensity Volitional Cough: Strong;Congested Volitional Swallow: Able to elicit    Oral/Motor/Sensory Function Overall Oral Motor/Sensory Function: Within functional limits   Ice Chips Ice chips: Not tested   Thin Liquid Thin Liquid: Impaired Presentation: Cup;Straw Pharyngeal  Phase Impairments: Cough - Immediate    Nectar Thick Nectar Thick Liquid: Not tested   Honey Thick Honey Thick Liquid: Not tested   Puree Puree: Within functional limits   Solid     Solid: Not tested (due to full liquids secondary to ileus)      [name] 10/15/2021,10:12 AM

## 2021-10-15 NOTE — Progress Notes (Signed)
Modified Barium Swallow Progress Note  Patient Details  Name: Rodney Love MRN: [id number] Date of Birth: 1945/04/03  Today's Date: 10/15/2021  Modified Barium Swallow completed.  Full report located under Chart Review in the Imaging Section.  Brief recommendations include the following:  Clinical Impression  Pt presents with mild-moderate oropharyngeal dysphagia with silent aspiration of thin barium. Upper denture in place and lower plate in pt's room. There was intermittent prolonged propulsion with most consistencies with lingual residue at times. Pharyngeal phase marked by reduced tongue base retraction, decreased pharyngeal contraction and late laryngeal closure resulting in mild-max vallecular residue across textures. Thin barium entered vestibule prior to laryngeal closure and fell into trachea with only slight delayed and ineffective throat clear. There was one instance of minimal penetration with nectar and given residual volume, honey thick not given. A chin tuck to decrease vallecular residue was effective. Pt is at higher opportunity for dysphagia given Parkinson's and is exacerbated with current illness. Results discussed with pt, wife and daughter who voiced understanding of recommendation of nectar thick liquids and puree texture given reduced endurance. SLP will follow and observe pt with higher food texture once lower denture donned once improvements seen in overall endurance.   Swallow Evaluation Recommendations       SLP Diet Recommendations: Dysphagia 1 (Puree) solids;Nectar thick liquid   Liquid Administration via: Straw;Cup   Medication Administration: Crushed with puree   Supervision: Staff to assist with self feeding;Full supervision/cueing for compensatory strategies   Compensations: Slow rate;Small sips/bites;Clear throat intermittently;Multiple dry swallows after each bite/sip   Postural Changes: Seated upright at 90 degrees   Oral Care Recommendations: Oral  care BID        [name] 10/15/2021,3:16 PM

## 2021-10-15 NOTE — Assessment & Plan Note (Signed)
Creatinine elevated 1.48 from 0.71. -Continuous IV fluids overnight.  Follow repeat in the morning.

## 2021-10-15 NOTE — Assessment & Plan Note (Signed)
No hyperglycermia. Will follow with morning labs for now

## 2021-10-15 NOTE — Assessment & Plan Note (Addendum)
Likely due to his Parkinson's disease.  Also has large stool burden seen on CT abdomen/pelvis.  ED PA attempted manual disimpaction but was only able to get small amount of stool.  No large stool burden or hard stool ball was noted. -Continue daily MiraLAX, senna -Wife declined any enema at this time since she feels patient will be agitated with any suppositories -Full Liquid diet for now for bowel rest

## 2021-10-15 NOTE — Assessment & Plan Note (Signed)
-  Continue Sinemet 50-200 at bedtime -Continue Sinemet CR 25-'100mg'$  1.5tabs 4 x daily - med rec does differ from last neurology note regarding dosage and whether he takes CR or IR formulation. Will need to verify with wife in the morning.

## 2021-10-15 NOTE — Assessment & Plan Note (Addendum)
Foley catheter placed in ED for concerns of urinary retention although wife reports that he had urinated earlier this morning. -UA with large leukocyte, negative showed rare bacteria concerning for possible UTI.  He is on IV Rocephin and azithromycin already for pneumonia coverage. -RN later noticed heavy urinary sediment and was concerned with it being fecal material.  I discussed his radiological findings CT abdomen/pelvis further with radiologist Dr. Ellard Artis who verifies that is likely due to chronic layering sediment and small calculi in his urine.  No intraperitoneal air was noted on CT.  This was seen on chest x-ray but is due to placement of colonic gastric distention. -urine culture is pending

## 2021-10-15 NOTE — Progress Notes (Signed)
Patient with urinary retention. Bladder scan done x 2 documented in epic  Dr Si Raider notified new orders received. Foley to be placed in the evening after 7 pm if patient unable to spontaneous void. Per  MD order. Handoff given to [place], [place]

## 2021-10-16 DIAGNOSIS — N179 Acute kidney failure, unspecified: Secondary | ICD-10-CM | POA: Diagnosis not present

## 2021-10-16 DIAGNOSIS — E43 Unspecified severe protein-calorie malnutrition: Secondary | ICD-10-CM

## 2021-10-16 DIAGNOSIS — R627 Adult failure to thrive: Secondary | ICD-10-CM

## 2021-10-16 DIAGNOSIS — L89159 Pressure ulcer of sacral region, unspecified stage: Secondary | ICD-10-CM | POA: Diagnosis present

## 2021-10-16 MED ORDER — ORAL CARE MOUTH RINSE: 15.0000 mL | OROMUCOSAL | Status: DC | PRN

## 2021-10-16 MED ORDER — SODIUM CHLORIDE 0.9 % IV SOLN
3.0000 g | Freq: Four times a day (QID) | INTRAVENOUS | Status: DC
  Administered 2021-10-16 – 2021-10-22 (×23): 3 g via INTRAVENOUS
  Filled 2021-10-16 (×27): qty 8

## 2021-10-16 MED ORDER — ORAL CARE MOUTH RINSE
15.0000 mL | OROMUCOSAL | Status: DC
  Administered 2021-10-17 – 2021-10-22 (×16): 15 mL via OROMUCOSAL

## 2021-10-16 MED ORDER — POTASSIUM CHLORIDE 20 MEQ PO PACK
40.0000 meq | PACK | Freq: Once | ORAL | Status: AC
Start: 2021-10-16 — End: 2021-10-16
  Administered 2021-10-16: 40 meq via ORAL
  Filled 2021-10-16: qty 2

## 2021-10-16 NOTE — Progress Notes (Signed)
Pharmacy Antibiotic Note  Rodney Love is a 76 y.o. male admitted on 10/14/2021 with worsened chronic cough, lower abdominal distension.  Patient with abnormal UA and urinary retention requiring placement of foley catheter.  CXR with free intraperitoneal air - opacity vs infiltrate.  Pharmacy has been consulted for Unasyn dosing for aspiration pneumonia and possible UTI.  Plan: Unasyn 3g IV q6 hours  Dose adjustments likely not necessary at this time given stable renal function. Pharmacy will formally sign off consult but continue to monitor patient peripherally.  Height: '6\' 1"'$  (185.4 cm) Weight: 58.3 kg (128 lb 8.5 oz) IBW/kg (Calculated) : 79.9  Temp (24hrs), Avg:98 F (36.7 C), Min:97.6 F (36.4 C), Max:98.3 F (36.8 C)  Recent Labs  Lab 10/14/21 1343 10/14/21 1345 10/14/21 1753 10/15/21 0543  WBC 16.4*  --   --  9.5  CREATININE 1.48*  --   --  0.86  LATICACIDVEN  --  1.6 2.0*  --     Estimated Creatinine Clearance: 58.4 mL/min (by C-G formula based on SCr of 0.86 mg/dL).    No Known Allergies  Antimicrobials this admission: 7/16 CTX/Azith >> 7/16 7/17 Augmentin >>7/18 7/18 Unasyn >>  Dose adjustments this admission:  Microbiology results: 7/16 BCx: ngtd 7/16 UCx: multiple species    Thank you for allowing pharmacy to be a part of this patient's care.  Dimple Nanas, PharmD, BCPS 10/16/2021 11:20 AM

## 2021-10-16 NOTE — TOC Initial Note (Addendum)
Transition of Care [facility]) - Initial/Assessment Note    Patient Details  Name: Rodney Love MRN: [id number] Date of Birth: 03/13/1946  [facility] CM/SW Contact:    Vassie Moselle, LCSW Phone Number: 10/16/2021, 3:06 PM  Clinical Narrative:                 Met with pt's family at bedside and discussed need for [facility] for wound care. Pt's family interested in this service. CSW currently seeking [facility] agency for [facility]. Palliative care has been consulted for this pt and CSW will continue to follow for further recommendations and discharge needs.   Update 1420: [place] set up with [facility]. [place] orders and a wound order to use [facility] HH wound protocol will need to be placed prior to discharge.  Expected Discharge Plan: [place] Barriers to Discharge: Continued Medical Work up   Patient Goals and CMS Choice Patient states their goals for this hospitalization and ongoing recovery are:: To go home   Choice offered to / list presented to : Patient, Spouse, Adult Children  Expected Discharge Plan and Services Expected Discharge Plan: [place] In-house Referral: Clinical Social Work, Hospice / Palliative Care Discharge Planning Services: CM Consult Post Acute Care Choice: [place] arrangements for the past 2 months: Single Family Home                 DME Arranged: N/A DME Agency: NA                  Prior Living Arrangements/Services Living arrangements for the past 2 months: Single Family Home Lives with:: Spouse Patient language and need for interpreter reviewed:: Yes Do you feel safe going back to the place where you live?: Yes      Need for Family Participation in Patient Care: Yes (Comment) Care giver support system in place?: No (comment) Current home services: DME Criminal Activity/Legal Involvement Pertinent to Current Situation/Hospitalization: No - Comment as needed  Activities of Daily Living Home Assistive  Devices/Equipment: Hoyer Lift ADL Screening (condition at time of admission) Patient's cognitive ability adequate to safely complete daily activities?: No Is the patient deaf or have difficulty hearing?: No Does the patient have difficulty seeing, even when wearing glasses/contacts?: No Does the patient have difficulty concentrating, remembering, or making decisions?: Yes Patient able to express need for assistance with ADLs?: Yes Does the patient have difficulty dressing or bathing?: Yes Independently performs ADLs?: No Communication: Dependent, Needs assistance Is this a change from baseline?: Pre-admission baseline Dressing (OT): Dependent, Needs assistance Is this a change from baseline?: Pre-admission baseline Grooming: Dependent, Needs assistance Is this a change from baseline?: Pre-admission baseline Feeding: Dependent, Needs assistance Is this a change from baseline?: Pre-admission baseline Bathing: Dependent, Needs assistance Is this a change from baseline?: Pre-admission baseline Toileting: Dependent, Needs assistance Is this a change from baseline?: Pre-admission baseline In/Out Bed: Dependent, Needs assistance Is this a change from baseline?: Pre-admission baseline Walks in Home: Dependent, Needs assistance Is this a change from baseline?: Pre-admission baseline Does the patient have difficulty walking or climbing stairs?: Yes Weakness of Legs: Both Weakness of Arms/Hands: Both  Permission Sought/Granted Permission sought to share information with : Facility Sport and exercise psychologist, Family Supports Permission granted to share information with : Yes, Verbal Permission Granted  Share Information with NAME: Lelon Ikard     Permission granted to share info w Relationship: Spouse  Permission granted to share info w Contact Information:  [phone]  Emotional Assessment Appearance:: Appears older than stated age Attitude/Demeanor/Rapport: Unable to Assess Affect  (typically observed): Unable to Assess Orientation: : Oriented to Self Alcohol / Substance Use: Not Applicable Psych Involvement: No (comment)  Admission diagnosis:  Ileus ([place]) [K56.7] Lower abdominal pain [R10.30] AKI (acute kidney injury) ([place]) [N17.9] Parkinson's disease ([place]) [G20] Community acquired pneumonia of right lower lobe of lung [J18.9] Patient Active Problem List   Diagnosis Date Noted   Decubitus ulcer of sacral area 10/16/2021   Protein-calorie malnutrition, severe ([place]) 10/16/2021   Failure to thrive in adult 10/16/2021   Acquired hypothyroidism 10/15/2021   AKI (acute kidney injury) ([place]) 10/14/2021   Ileus ([place]) 10/14/2021   Community acquired pneumonia 10/14/2021   Acute urinary retention 10/14/2021   Pressure injury of skin 05/03/2020   Femur fracture, right ([place]) 04/28/2020   PD (Parkinson's disease) ([place]) 12/18/2015   Tremor of both hands 11/22/2015   PAD (peripheral artery disease) s/p remote left external iliac artery angioplasty 10/28/2012   Coronary atherosclerosis 10/28/2012   Hypercholesterolemia 10/28/2012   Essential hypertension 10/28/2012   Diabetes mellitus type 2 in nonobese ([place]) 10/28/2012   Carotid occlusion, right 10/28/2012   PCP:  Lujean Amel, MD Pharmacy:   [facility] [place], [place] - [address] [place] [address] Phone: [phone] Fax: [phone]     Social Determinants of Health (SDOH) Interventions    Readmission Risk Interventions    10/16/2021    3:04 PM 05/01/2020   12:29 PM  Readmission Risk Prevention Plan  Post Dischage Appt Complete   Medication Screening Complete   Transportation Screening Complete Complete  PCP or Specialist Appt within 5-7 Days  Complete  Home Care Screening  Complete  Medication Review (RN CM)  Complete

## 2021-10-16 NOTE — Progress Notes (Signed)
PT Cancellation Note  Patient Details Name: Rodney Love MRN: [id number] DOB: 09/26/1945   Cancelled Treatment:    Reason Eval/Treat Not Completed: PT screened, no needs identified, will sign off (Per OT pt is at baseline of bed bound and total assist. Patients spouse providing care at home and has mechanical left to complete skin checks and change bed linens. No acute PT needs, will sign off at this time.)   [place], DPT Acute Rehabilitation Services Office [phone] Pager [phone]  10/16/21 11:55 AM

## 2021-10-16 NOTE — Progress Notes (Signed)
PROGRESS NOTE    RODRIQUEZ THORNER  WJX:[id number] DOB: September 15, 1945 DOA: 10/14/2021 PCP: Lujean Amel, MD  76/M with history of Parkinson's disease, dementia, severe malnutrition, sacral decubitus wounds, type 2 diabetes mellitus, PAD, hypothyroidism was brought to the ED by his wife who is his primary caretaker, she noted mild lower abdominal distention with worsening of his chronic cough. -In the ED, had leukocytosis of 16.4, mild AKI, abnormal UA and urinary retention, had to have Foley catheter.  UA was abnormal, chest x-ray was concerning for free intraperitoneal headache air and streaky right basilar opacities, CT abdomen pelvis noted large amount of stool distending abdomen, possible fecal impaction and distended bladder along with right lower lobe infiltrate versus atelectasis   Subjective: -Had a BM overnight, otherwise looks the same  Assessment and Plan:  UTI Acute urinary retention -Start IV Unasyn to cover aspiration pneumonia and possible UTI with retention -Urine culture with multiple species, will repeat -Due to difficulties with transportation, poor mobility family discussed with my partner yesterday and wanted to discontinue Foley catheter, attempt voiding trial, Foley catheter was removed yesterday -Started on Flomax, continue -Monitor for intermittent retention voiding now  * AKI (acute kidney injury) ([place]) -Resolved with hydration  Aspiration pneumonia Chest x-ray showed right basilar opacity concerning for possible infiltrate.  Wife also mentions increased cough in the past 2 days.  -He is high aspiration risk with advanced Parkinson's and severe malnutrition -ABX changed to Unasyn -SLP evaluation noted moderate oropharyngeal dysphagia with silent aspiration of thin barium, recommended dysphagia 1 diet  Ileus (HCC) Likely due to his Parkinson's disease.  Also has large stool burden seen on CT abdomen/pelvis.   -Continue daily MiraLAX, senna -1 BM yesterday  PD  (Parkinson's disease) ([place]) -Continue Sinemet  Sacral decubitus wound -poa, Woc Consult  Diabetes mellitus type 2 in nonobese (HCC) No hyperglycermia. Will follow with morning labs for now  Hypokalemia -Replace  ETHICS: This is a chronically ill 76/M with advanced Parkinson's disease, dementia, severe malnutrition, dysphagia, sacral decubitus wounds admitted with urinary retention, UTI and aspiration pneumonia, he has severe failure to thrive and poor prognosis.  Discussed this with patient's wife today at bedside, recommended palliative care consultation for goals of care, spouse would like to discuss CODE STATUS with her children as well  DVT prophylaxis: Lovenox Code Status: Full code Family Communication: Discussed with spouse at bedside Disposition Plan: Home, he is total care's  Consultants:    Procedures:   Antimicrobials:    Objective: Vitals:   10/15/21 1002 10/15/21 1758 10/15/21 2236 10/16/21 0607  BP: (!) 81/55 (!) 115/55 (!) 116/59 104/64  Pulse: 68 86 80 86  Resp: '17 17 20 16  '$ Temp: 98.1 F (36.7 C) 98.2 F (36.8 C) 98.3 F (36.8 C) 97.6 F (36.4 C)  TempSrc: Oral Oral Oral Oral  SpO2: 92% 94% 94% 94%  Weight:      Height:        Intake/Output Summary (Last 24 hours) at 10/16/2021 1110 Last data filed at 10/16/2021 1005 Gross per 24 hour  Intake 120 ml  Output 825 ml  Net -705 ml   Filed Weights   10/15/21 0556  Weight: 58.3 kg    Examination:  General exam: Chronically ill extremely cachectic male laying in bed, eyes open, awake alert oriented to self only, mumbles few words CVS: S1-S2, regular rhythm Lungs: Scattered rhonchi at both bases Abdomen: Soft, less distended, bowel sounds increased Extremities: No edema Skin: Sacral decubitus wound Psych: Flat affect  Data Reviewed:   CBC: Recent Labs  Lab 10/14/21 1343 10/15/21 0543  WBC 16.4* 9.5  NEUTROABS 14.7*  --   HGB 13.9 13.1  HCT 42.0 39.8  MCV 96.8 97.8  PLT 183 557    Basic Metabolic Panel: Recent Labs  Lab 10/14/21 1343 10/15/21 0543  NA 140 139  K 3.6 3.3*  CL 106 107  CO2 20* 25  GLUCOSE 165* 118*  BUN 35* 25*  CREATININE 1.48* 0.86  CALCIUM 9.4 9.1  MG  --  2.0   GFR: Estimated Creatinine Clearance: 58.4 mL/min (by C-G formula based on SCr of 0.86 mg/dL). Liver Function Tests: Recent Labs  Lab 10/14/21 1343  AST 16  ALT 9  ALKPHOS 80  BILITOT 1.6*  PROT 7.2  ALBUMIN 3.6   Recent Labs  Lab 10/14/21 1343  LIPASE 20   No results for input(s): "AMMONIA" in the last 168 hours. Coagulation Profile: No results for input(s): "INR", "PROTIME" in the last 168 hours. Cardiac Enzymes: No results for input(s): "CKTOTAL", "CKMB", "CKMBINDEX", "TROPONINI" in the last 168 hours. BNP (last 3 results) No results for input(s): "PROBNP" in the last 8760 hours. HbA1C: No results for input(s): "HGBA1C" in the last 72 hours. CBG: Recent Labs  Lab 10/14/21 1407  GLUCAP 166*   Lipid Profile: No results for input(s): "CHOL", "HDL", "LDLCALC", "TRIG", "CHOLHDL", "LDLDIRECT" in the last 72 hours. Thyroid Function Tests: No results for input(s): "TSH", "T4TOTAL", "FREET4", "T3FREE", "THYROIDAB" in the last 72 hours. Anemia Panel: No results for input(s): "VITAMINB12", "FOLATE", "FERRITIN", "TIBC", "IRON", "RETICCTPCT" in the last 72 hours. Urine analysis:    Component Value Date/Time   COLORURINE YELLOW 10/14/2021 1343   APPEARANCEUR HAZY (A) 10/14/2021 1343   LABSPEC 1.008 10/14/2021 1343   PHURINE 6.0 10/14/2021 1343   GLUCOSEU NEGATIVE 10/14/2021 1343   HGBUR MODERATE (A) 10/14/2021 1343   BILIRUBINUR NEGATIVE 10/14/2021 1343   KETONESUR NEGATIVE 10/14/2021 1343   PROTEINUR NEGATIVE 10/14/2021 1343   NITRITE NEGATIVE 10/14/2021 1343   LEUKOCYTESUR LARGE (A) 10/14/2021 1343   Sepsis Labs: '@LABRCNTIP'$ (procalcitonin:4,lacticidven:4)  ) Recent Results (from the past 240 hour(s))  Urine Culture     Status: Abnormal   Collection  Time: 10/14/21  1:43 PM   Specimen: Urine, Clean Catch  Result Value Ref Range Status   Specimen Description   Final    URINE, CLEAN CATCH Performed at [facility], [place] [address], [place], [place] [address]    Special Requests   Final    NONE Performed at [facility], [place] [address], [place], [place] [address]    Culture MULTIPLE SPECIES PRESENT, SUGGEST RECOLLECTION (A)  Final   Report Status 10/15/2021 FINAL  Final  Blood culture (routine x 2)     Status: None (Preliminary result)   Collection Time: 10/14/21  1:45 PM   Specimen: BLOOD  Result Value Ref Range Status   Specimen Description   Final    BLOOD BLOOD LEFT FOREARM Performed at [facility] [address] [place], [place] [address]    Special Requests   Final    BOTTLES DRAWN AEROBIC AND ANAEROBIC Blood Culture adequate volume Performed at [facility] [address] [place], [place] [address]    Culture   Final    NO GROWTH < 24 HOURS Performed at [facility] [name] [place], [place] [address]    Report Status PENDING  Incomplete  Blood culture (routine x 2)  Status: None (Preliminary result)   Collection Time: 10/14/21  5:53 PM   Specimen: BLOOD  Result Value Ref Range Status   Specimen Description BLOOD RIGHT FOREARM  Final   Special Requests   Final    BOTTLES DRAWN AEROBIC AND ANAEROBIC Blood Culture results may not be optimal due to an excessive volume of blood received in culture bottles   Culture   Final    NO GROWTH < 12 HOURS Performed at [facility], [address] [place], [place] [address]    Report Status PENDING  Incomplete     Radiology Studies: DG Swallowing Func-Speech Pathology  Result Date: 10/15/2021 Table formatting from the original result was not included. Objective Swallowing Evaluation: Type of Study: MBS-Modified Barium Swallow Study  Patient Details Name: SOHAIL CAPRARO MRN:  [id number] Date of Birth: 13-May-1945 Today's Date: 10/15/2021 Time: SLP Start Time (ACUTE ONLY): 30 -SLP Stop Time (ACUTE ONLY): 5397 SLP Time Calculation (min) (ACUTE ONLY): 16 min Past Medical History: Past Medical History: Diagnosis Date  Arthritis   Constipation   OCCASIONAL  Coronary artery disease   Diabetes mellitus without complication ([place])   Environmental allergies   Hyperlipidemia   Hypertension   Inguinal hernia   Iron deficiency   Nocturia   Parkinson disease ([place])   Peripheral arterial disease ([place])  Past Surgical History: Past Surgical History: Procedure Laterality Date  BACK SURGERY    CARDIAC CATHETERIZATION  05/29/2006  single vessel CAD w/moderate stenosis of the prox. RCA approx 40%,prox. LAD 20-30%,minor irreg. mid abdominal aorta,left iliac artery disease 50-60%  CATARACT EXTRACTION Left 12/2016  HERNIA REPAIR  [id number]  INGUINAL HERNIA REPAIR N/A 09/23/2012  Procedure: LAPAROSCOPIC INGUINAL HERNIA with umbilical hernia;  Surgeon: Madilyn Hook, DO;  Location: [facility];  Service: General;  Laterality: N/A;  INSERTION OF MESH Bilateral 09/23/2012  Procedure: INSERTION OF MESH;  Surgeon: Madilyn Hook, DO;  Location: [facility];  Service: General;  Laterality: Bilateral;  TOTAL HIP ARTHROPLASTY Right 04/29/2020  Procedure: TOTAL HIP ARTHROPLASTY ANTERIOR APPROACH;  Surgeon: Rod Can, MD;  Location: [facility];  Service: Orthopedics;  Laterality: Right; HPI: YANIEL LIMBAUGH is a 76 y.o. male with medical history significant of Parkinson's, type 2 diabetes, PAD, hypothyroidism and hyperlipidemia who presents with abdominal distention, bedbound and mostly non-verbal due to Parkinson's. per chart. Pt has chronic cough but has noticed that it has been worse in the past 2 days and productive. Found to have AKI, urinary retention, pneumonia and suspected ileus with full liquid diet (MD stated does not suspect he has an ileus).  No data recorded  Recommendations for follow up therapy are one component of a  multi-disciplinary discharge planning process, led by the attending physician.  Recommendations may be updated based on patient status, additional functional criteria and insurance authorization. Assessment / Plan / Recommendation   10/15/2021   2:55 PM Clinical Impressions Clinical Impression Pt presents with mild-moderate oropharyngeal dysphagia with silent aspiration of thin barium. Upper denture in place and lower plate in pt's room. There was intermittent prolonged propulsion with most consistencies with lingual residue at times. Pharyngeal phase marked by reduced tongue base retraction, decreased pharyngeal contraction and late laryngeal closure resulting in mild-max vallecular residue across textures. Thin barium entered vestibule prior to laryngeal closure and fell into trachea with only slight delayed and ineffective throat clear. There was one instance of minimal penetration with nectar and given residual volume, honey thick not given. A chin tuck to decrease vallecular residue was  effective. Pt is at higher opportunity for dysphagia given Parkinson's and is exacerbated with current illness. Results discussed with pt, wife and daughter who voiced understanding of recommendation of nectar thick liquids and puree texture given reduced endurance. SLP will follow and observe pt with higher food texture once lower denture donned once improvements seen in overall endurance. SLP Visit Diagnosis Dysphagia, oropharyngeal phase (R13.12) Impact on safety and function Moderate aspiration risk     10/15/2021   2:55 PM Treatment Recommendations Treatment Recommendations Therapy as outlined in treatment plan below     10/15/2021   2:55 PM Prognosis Prognosis for Safe Diet Advancement Good Barriers to Reach Goals --   10/15/2021   2:55 PM Diet Recommendations SLP Diet Recommendations Dysphagia 1 (Puree) solids;Nectar thick liquid Liquid Administration via Straw;Cup Medication Administration Crushed with puree Compensations  Slow rate;Small sips/bites;Clear throat intermittently;Multiple dry swallows after each bite/sip Postural Changes Seated upright at 90 degrees     10/15/2021   2:55 PM Other Recommendations Oral Care Recommendations Oral care BID Follow Up Recommendations -- Assistance recommended at discharge Frequent or constant Supervision/Assistance Functional Status Assessment Patient has had a recent decline in their functional status and demonstrates the ability to make significant improvements in function in a reasonable and predictable amount of time.   10/15/2021   2:55 PM Frequency and Duration  Speech Therapy Frequency (ACUTE ONLY) min 2x/week Treatment Duration 2 weeks     10/15/2021   2:55 PM Oral Phase Oral Phase Impaired Oral - Nectar Cup Lingual/palatal residue;Delayed oral transit Oral - Nectar Straw Delayed oral transit;Lingual/palatal residue Oral - Thin Cup Delayed oral transit Oral - Puree Lingual/palatal residue;Delayed oral transit Oral - Regular Delayed oral transit;Reduced posterior propulsion    10/15/2021   2:55 PM Pharyngeal Phase Pharyngeal Phase Impaired Pharyngeal- Nectar Cup Delayed swallow initiation-vallecula;Penetration/Aspiration during swallow;Pharyngeal residue - valleculae;Pharyngeal residue - pyriform;Reduced tongue base retraction;Reduced pharyngeal peristalsis Pharyngeal Material enters airway, remains ABOVE vocal cords and not ejected out;Material does not enter airway Pharyngeal- Nectar Straw Pharyngeal residue - valleculae;Pharyngeal residue - pyriform;Reduced pharyngeal peristalsis;Reduced tongue base retraction Pharyngeal- Thin Cup Penetration/Aspiration during swallow;Reduced tongue base retraction;Reduced pharyngeal peristalsis;Pharyngeal residue - valleculae;Pharyngeal residue - pyriform Pharyngeal Material enters airway, passes BELOW cords without attempt by patient to eject out (silent aspiration) Pharyngeal- Puree Delayed swallow initiation-vallecula;Pharyngeal residue -  valleculae;Pharyngeal residue - pyriform;Reduced tongue base retraction;Reduced pharyngeal peristalsis Pharyngeal- Regular Pharyngeal residue - valleculae;Reduced laryngeal elevation    10/15/2021   2:55 PM Cervical Esophageal Phase  Cervical Esophageal Phase WFL [name] 10/15/2021, 3:15 PM                     CT ABDOMEN PELVIS W CONTRAST  Result Date: 10/14/2021 CLINICAL DATA:  Acute generalized abdominal pain. EXAM: CT ABDOMEN AND PELVIS WITH CONTRAST TECHNIQUE: Multidetector CT imaging of the abdomen and pelvis was performed using the standard protocol following bolus administration of intravenous contrast. RADIATION DOSE REDUCTION: This exam was performed according to the departmental dose-optimization program which includes automated exposure control, adjustment of the mA and/or kV according to patient size and/or use of iterative reconstruction technique. CONTRAST:  132m OMNIPAQUE IOHEXOL 300 MG/ML  SOLN COMPARISON:  None Available. FINDINGS: Lower Chest: Infiltrate or atelectasis is seen in the posterior right lower lobe. Hepatobiliary: No hepatic masses identified. Gallbladder is unremarkable. No evidence of biliary ductal dilatation. Pancreas: No mass or inflammatory changes. Diffuse fatty replacement of pancreas noted. Spleen: Within normal limits in size and appearance. Adrenals/Urinary Tract: Severe renal  vascular calcification noted bilaterally. 2 mm calculus noted in midpole of left kidney. No masses identified. No evidence of ureteral calculi or hydronephrosis. The urinary bladder is dilated and numerous tiny calculi are seen along the dependent wall of the urinary bladder. Stomach/Bowel: Diffuse dilatation of small and large bowel is seen, which may be due to ileus or fecal impaction. Large amount of stool is seen distending the rectum. No evidence of bowel obstruction, inflammatory process or abnormal fluid collections. Vascular/Lymphatic: No pathologically enlarged lymph nodes. No  acute vascular findings. Aortic atherosclerotic calcification incidentally noted. Reproductive: Visualization due to severe artifact from right hip prosthesis. Mildly enlarged prostate gland with mass effect on bladder base. Other:  None. Musculoskeletal:  No suspicious bone lesions identified. IMPRESSION: Large amount of stool distending the rectum, suspicious for fecal impaction. Diffuse dilatation of small and large bowel, which may be due to ileus or fecal impaction. Mildly enlarged prostate. Distended urinary bladder likely due to chronic bladder outlet obstruction. Numerous tiny bladder calculi. Tiny left renal calculus. No evidence of ureteral calculi or hydronephrosis. Infiltrate or atelectasis in posterior right lower lobe. Pneumonia cannot be excluded. Aortic Atherosclerosis (ICD10-I70.0). Electronically Signed   By: Marlaine Hind M.D.   On: 10/14/2021 16:40   DG Chest Portable 1 View  Result Date: 10/14/2021 CLINICAL DATA:  Sepsis EXAM: PORTABLE CHEST 1 VIEW COMPARISON:  04/28/2020 FINDINGS: Loop recorder device overlies the left chest. The heart size and mediastinal contours are within normal limits. Streaky right basilar interstitial opacity. Appearance suggestive of free intraperitoneal air with increased lucency under the right hemidiaphragm and centrally. The visualized skeletal structures are unremarkable. IMPRESSION: 1. Appearance suggestive of free intraperitoneal air. Recommend CT of the abdomen and pelvis for further assessment. 2. Streaky right basilar interstitial opacity, which may represent atelectasis versus infiltrate. These results were called by telephone at the time of interpretation on 10/14/2021 at 3:21 pm to provider [facility] , who verbally acknowledged these results. Electronically Signed   By: Davina Poke D.O.   On: 10/14/2021 15:23     Scheduled Meds:  amoxicillin-clavulanate  1 tablet Oral Q12H   aspirin EC  81 mg Oral Daily   atorvastatin  10 mg Oral Daily    carbidopa-levodopa  1 tablet Oral QHS   Carbidopa-Levodopa ER  1.5 tablet Oral QID   Chlorhexidine Gluconate Cloth  6 each Topical Daily   cholecalciferol  1,000 Units Oral Daily   enoxaparin (LOVENOX) injection  40 mg Subcutaneous Q24H   ferrous sulfate  325 mg Oral Q breakfast   polyethylene glycol  17 g Oral BID   potassium chloride  40 mEq Oral Once   senna-docusate  1 tablet Oral BID   tamsulosin  0.4 mg Oral QPC supper   Continuous Infusions:   LOS: 2 days    Time spent: 79mn    PDomenic Polite MD [facility]   10/16/2021, 11:10 AM

## 2021-10-16 NOTE — Consult Note (Signed)
WOC Nurse Consult Note: Reason for Consult:Deep tissue pressure injury to sacrum with purple/maroon area in center that is beginning to slough. Deep tissue pressure injury in evolution. Wound type:pressure plus irritant contact dermatitis due to fecal incontinence   ICD-10 CM Codes for Irritant Dermatitis  L24A2 - Due to fecal, urinary or dual incontinence  Pressure Injury POA: Yes Measurement:Affected area is 11cm x 7cm with central area of deeper hued tissue measuring 4cm x 3cm with epidermal sloughing. Note: this areas is expected to decline often resulting in a full thickness ulceration despite aggressive measures.  Wound bed: nonblanching erythema and as described above Drainage (amount, consistency, odor) scant serous Periwound: nonblanching erythema, irritant contact dermatitis from fecal incontinence  Dressing procedure/placement/frequency: I have provided nursing with guidance for topical care of the DTPI using xeroform gauze topped with a silicone foam dressing oriented so that the tip points away from the anus due to fecal incontinence. Turning and repositioning is in place but guidance is provided so that time in the supine position is minimized.Bilateral heel boots are provided to prevent PI in those areas.   [facility] nursing team will not follow, but will remain available to this patient, the nursing and medical teams.  Please re-consult if needed.  Thank you for inviting [place] to participate in this patient's Plan of Care.  Maudie Flakes, MSN, RN, CNS, [place], Serita Grammes, [employer], [employer] phone:  [phone]

## 2021-10-16 NOTE — Evaluation (Signed)
Occupational Therapy Evaluation Patient Details Name: Rodney Love MRN: [id number] DOB: Jun 17, 1945 Today's Date: 10/16/2021   History of Present Illness Patient is a 76 year old male who presented to the hosptial from home with abdominal distension. patient was admitted with AKI,urinary retention and CAP PMH: parkinsons disease, DM, CAD, HTN, hyperlipidemia, R THA   Clinical Impression   Patient evaluated by Occupational Therapy with no further acute OT needs identified. All education has been completed and the patient has no further questions. Patient is at baseline for ADLs at this time with TD from family. Patients wife was educated on positioning and prevention of contractures in [place] and palm guards.  See below for any follow-up Occupational Therapy or equipment needs. OT is signing off. Thank you for this referral.       Recommendations for follow up therapy are one component of a multi-disciplinary discharge planning process, led by the attending physician.  Recommendations may be updated based on patient status, additional functional criteria and insurance authorization.   Follow Up Recommendations  No OT follow up    Assistance Recommended at Discharge Frequent or constant Supervision/Assistance  Patient can return home with the following Two people to help with walking and/or transfers;Two people to help with bathing/dressing/bathroom;Direct supervision/assist for medications management;Help with stairs or ramp for entrance;Assistance with feeding;Assist for transportation;Direct supervision/assist for financial management;Assistance with cooking/housework    Functional Status Assessment  Patient has not had a recent decline in their functional status  Equipment Recommendations  Other (comment) (palm guard for L hand)    Recommendations for Other Services       Precautions / Restrictions Precautions Precautions: Fall Restrictions Weight Bearing Restrictions: No       Mobility Bed Mobility                    Transfers                          Balance                                           ADL either performed or assessed with clinical judgement   ADL Overall ADL's : At baseline                                       General ADL Comments: patient is TD for ADL tasks with wife and family providing care at home. patient does not participate in self feeding tasks at this time. patient and wife were educated on importance of keeping BUE moving during the day with gentle ROM. patient's wife verbalized [place] reproting daughter comes and does exerciese eace day with patient. patient's wife was educated on hand hygiene and importance of nail care with tendency for fisting of hands. patients wife verbalized understanding. patient's wife was educated on plam guard and was provided with handout. patient's wife reported she would have daughter look into it.     Vision         Perception     Praxis      Pertinent Vitals/Pain Pain Assessment Pain Assessment: No/denies pain     Hand Dominance Right   Extremity/Trunk Assessment Upper Extremity Assessment Upper Extremity Assessment: RUE deficits/detail;LUE deficits/detail RUE Deficits / Details: patient  was noted to be able to grossly move digits and elbow when intrinsivly motivated. patien was resitive to guided ROM. LUE Deficits / Details: patient noted to hold L hand in fisted position with wife reporting it takes increased time to open hand up. patient was able to open hand about 25% with clearance of palmar surface. patient was resistive to elbow ROM and shoulder ROM on this date.           Communication Communication Communication: HOH   Cognition Arousal/Alertness: Awake/alert Behavior During Therapy: Flat affect Overall Cognitive Status: History of cognitive impairments - at baseline                                  General Comments: patients wife present and provided most of PLOF     General Comments       Exercises     Shoulder Instructions      Home Living Family/patient expects to be discharged to:: Private residence Living Arrangements: Spouse/significant other Available Help at Discharge: Family;Available 24 hours/day (wife 24/7 and daughter daily after 2 pm) Type of Home: House Home Access: Level entry     Home Layout: One level                          Prior Functioning/Environment Prior Level of Function : Needs assist       Physical Assist : ADLs (physical);Mobility (physical) Mobility (physical): Stairs;Bed mobility;Gait;Transfers ADLs (physical): Feeding;Grooming;Bathing;Dressing;Toileting;IADLs Mobility Comments: patient is TD for movement with wife using total lift for patient to get up off bed for hygiene and linen changes. ADLs Comments: patient is TD. patient' wife completes feeding seocndary to ensure intake with patient limiting intake when he attempts to self feed.        OT Problem List:        OT Treatment/Interventions:      OT Goals(Current goals can be found in the care plan section) Acute Rehab OT Goals OT Goal Formulation: All assessment and education complete, DC therapy  OT Frequency:      Co-evaluation              AM-PAC OT "6 Clicks" Daily Activity     Outcome Measure Help from another person eating meals?: Total Help from another person taking care of personal grooming?: Total Help from another person toileting, which includes using toliet, bedpan, or urinal?: Total Help from another person bathing (including washing, rinsing, drying)?: Total Help from another person to put on and taking off regular upper body clothing?: Total Help from another person to put on and taking off regular lower body clothing?: Total 6 Click Score: 6   End of Session Nurse Communication: Other (comment) (ok to participate in  session)  Activity Tolerance: Patient tolerated treatment well Patient left: in bed;with family/visitor present  OT Visit Diagnosis: Muscle weakness (generalized) (M62.81)                Time: 2707-8675 OT Time Calculation (min): 21 min Charges:  OT General Charges $OT Visit: 1 Visit OT Evaluation $OT Eval Low Complexity: 1 Low  Jackelyn Poling OTR/L, MS Acute Rehabilitation Department Office# [phone] Pager# [phone]   Marcellina Millin 10/16/2021, 8:48 AM

## 2021-10-17 ENCOUNTER — Inpatient Hospital Stay (HOSPITAL_COMMUNITY): Payer: Medicare Other

## 2021-10-17 ENCOUNTER — Ambulatory Visit (HOSPITAL_COMMUNITY)
Admission: RE | Admit: 2021-10-17 | Discharge: 2021-10-17 | Disposition: A | Discharge status: Home / Self Care | Payer: Medicare Other | Source: Ambulatory Visit | Attending: Cardiovascular Disease | Admitting: Cardiovascular Disease

## 2021-10-17 DIAGNOSIS — J690 Pneumonitis due to inhalation of food and vomit: Secondary | ICD-10-CM

## 2021-10-17 DIAGNOSIS — R338 Other retention of urine: Secondary | ICD-10-CM | POA: Diagnosis not present

## 2021-10-17 DIAGNOSIS — R627 Adult failure to thrive: Secondary | ICD-10-CM

## 2021-10-17 DIAGNOSIS — K5641 Fecal impaction: Secondary | ICD-10-CM

## 2021-10-17 LAB — CBC
HCT: 36.4 % — ABNORMAL LOW (ref 39.0–52.0)
Hemoglobin: 11.8 g/dL — ABNORMAL LOW (ref 13.0–17.0)
MCH: 31.9 pg (ref 26.0–34.0)
MCHC: 32.4 g/dL (ref 30.0–36.0)
MCV: 98.4 fL (ref 80.0–100.0)
Platelets: 153 10*3/uL (ref 150–400)
RBC: 3.7 MIL/uL — ABNORMAL LOW (ref 4.22–5.81)
RDW: 14.3 % (ref 11.5–15.5)
WBC: 5.2 10*3/uL (ref 4.0–10.5)
nRBC: 0 % (ref 0.0–0.2)

## 2021-10-17 LAB — BASIC METABOLIC PANEL
Anion gap: 7 (ref 5–15)
BUN: 16 mg/dL (ref 8–23)
CO2: 23 mmol/L (ref 22–32)
Calcium: 8.5 mg/dL — ABNORMAL LOW (ref 8.9–10.3)
Chloride: 108 mmol/L (ref 98–111)
Creatinine, Ser: 0.4 mg/dL — ABNORMAL LOW (ref 0.61–1.24)
GFR, Estimated: >60 mL/min (ref >60)
Glucose, Bld: 128 mg/dL — ABNORMAL HIGH (ref 70–99)
Potassium: 3.1 mmol/L — ABNORMAL LOW (ref 3.5–5.1)
Sodium: 138 mmol/L (ref 135–145)

## 2021-10-17 LAB — URINE CULTURE: Culture: NO GROWTH

## 2021-10-17 MED ORDER — MILK AND MOLASSES ENEMA
1.0000 | Freq: Once | RECTAL | Status: AC
  Administered 2021-10-17: 240 mL via RECTAL
  Filled 2021-10-17: qty 240

## 2021-10-17 MED ORDER — POTASSIUM CHLORIDE CRYS ER 20 MEQ PO TBCR
40.0000 meq | EXTENDED_RELEASE_TABLET | ORAL | Status: AC
  Administered 2021-10-17 (×2): 40 meq via ORAL
  Filled 2021-10-17 (×2): qty 2

## 2021-10-17 NOTE — Plan of Care (Signed)

## 2021-10-17 NOTE — Progress Notes (Signed)
SLP Cancellation Note  Patient Details Name: Rodney Love MRN: [id number] DOB: 1945/08/07   Cancelled treatment:       Reason Eval/Treat Not Completed: Other (comment) (pt getting put on an air bed at this time; will continue efforts)  Kathleen Lime, MS [place] Office [phone] Pager [phone]  [name] 10/17/2021, 3:09 PM

## 2021-10-17 NOTE — Progress Notes (Addendum)
PROGRESS NOTE    Rodney Love  YSA:[id number] DOB: Aug 16, 1945 DOA: 10/14/2021 PCP: Lujean Amel, MD  76/M with history of Parkinson's disease, dementia, severe malnutrition, sacral decubitus wounds, type 2 diabetes mellitus, PAD, hypothyroidism was brought to the ED by his wife who is his primary caretaker, she noted mild lower abdominal distention with worsening of his chronic cough. -In the ED, had leukocytosis of 16.4, mild AKI, abnormal UA and urinary retention, had to have Foley catheter.  UA was abnormal, chest x-ray was concerning for free intraperitoneal  air and streaky right basilar opacities, CT abdomen pelvis noted large amount of stool distending abdomen, possible fecal impaction and distended bladder along with right lower lobe infiltrate versus atelectasis.  Treating for UTI, acute urinary retention, aspiration pneumonia and AKI.  Improving.   Subjective: Patient is a poor historian.  Denies complaints.  As per spouse at bedside, ate some breakfast this morning.  As per nursing, had a very large BM yesterday.  No pain reported.  Assessment and Plan:  UTI Acute urinary retention -Initially got a dose of IV ceftriaxone and azithromycin which was then switched to IV Unasyn to cover aspiration pneumonia and possible UTI with retention -Urine culture with multiple species, repeat urine culture shows no growth.  UTI treated. -Due to difficulties with transportation, poor mobility family discussed with prior MDs and wanted to discontinue Foley catheter.  As per RN report, failed voiding trial and Foley catheter was placed back. -Started on Flomax, continue -Recommend outpatient follow-up with urology in 1 to 2 weeks for voiding trial. -Fecal impaction may be contributing, follow-up KUB and if still has fecal impaction then will consider enema. -Initiated Flomax 0.4 mg daily.  Mildly enlarged prostate on recent CT abdomen 7/16.  * AKI (acute kidney injury) ([place]) -Resolved with  hydration  Aspiration pneumonia Chest x-ray showed right basilar opacity concerning for possible infiltrate.  Wife also mentions increased cough in the past 2 days.  -He is high aspiration risk with advanced Parkinson's and severe malnutrition -ABX changed to Unasyn, day 2 -SLP evaluation noted moderate oropharyngeal dysphagia with silent aspiration of thin barium, recommended dysphagia 1 diet -Blood cultures x2: Negative to date.  No fevers, leukocytosis or hypoxia. Consider transitioning back to Augmentin in a.m.  Remains at high risk for recurrent aspiration going forward.  May need to consider palliative care consultation, can be pursued as outpatient.  Ileus (HCC)/fecal impaction Likely due to his Parkinson's disease.  Also has large stool burden seen on CT abdomen/pelvis.   -Continue daily MiraLAX, senna - management as noted above. -KUB shows large stool burden in the rectum.  Enema ordered.  PD (Parkinson's disease) ([place]) -Continue Sinemet  Sacral decubitus wound, POA Pressure Injury 05/02/20 Sacrum Stage 2 -  Partial thickness loss of dermis presenting as a shallow open injury with a red, pink wound bed without slough. (Active)  05/02/20 (Pt admitted on 1/29 and stage 1 was documented on initial assessment) 1830  Location: Sacrum  Location Orientation:   Staging: Stage 2 -  Partial thickness loss of dermis presenting as a shallow open injury with a red, pink wound bed without slough.  Wound Description (Comments):   Present on Admission:    [place] RN input appreciated.  Diabetes mellitus type 2 in nonobese (HCC) A.m. blood sugars on BMPs unremarkable.  A1c 5.5 in January.  Hypokalemia -Replace aggressively and follow.  Check and replace magnesium as needed.  Anemia: Follow CBC in AM.  ETHICS: This is a chronically  ill 76/M with advanced Parkinson's disease, dementia, severe malnutrition, dysphagia, sacral decubitus wounds admitted with urinary retention, UTI and  aspiration pneumonia, he has severe failure to thrive and poor prognosis.  Prior MD discussed this with patient's wife at bedside, recommended palliative care consultation for goals of care, spouse would like to discuss CODE STATUS with her children as well  DVT prophylaxis: Lovenox Code Status: Full code Family Communication: Discussed with spouse and son at bedside Disposition Plan: Home, he is total care's.  Hopeful DC in the next 1 to 2 days.  Consultants:  Palliative care medicine.  Input pending.  Procedures: Foley catheter.  Antimicrobials:  As noted above.  Objective: Vitals:   10/16/21 1436 10/16/21 2117 10/17/21 0702 10/17/21 1428  BP: (!) 106/49 124/76 127/81 125/70  Pulse: (!) 53 66 (!) 50 72  Resp: '16 17 17   '$ Temp: (!) 97.4 F (36.3 C) 98 F (36.7 C) 97.8 F (36.6 C) 98.3 F (36.8 C)  TempSrc: Oral Axillary Oral Oral  SpO2: 99% 96% 98% 99%  Weight:      Height:        Intake/Output Summary (Last 24 hours) at 10/17/2021 1501 Last data filed at 10/17/2021 1431 Gross per 24 hour  Intake 300 ml  Output 1300 ml  Net -1000 ml   Filed Weights   10/15/21 0556  Weight: 58.3 kg    Examination:  General exam: Elderly male, moderately built, frail, cachectic and chronically ill looking lying propped up in bed without distress.  Edentulous.  Spouse placed back his dentures. CVS: S1 and S2 heard, RRR.  No JVD or murmurs.  Telemetry personally reviewed: Sinus rhythm and occasional PVCs. Lungs: Diminished breath sounds in the bases but otherwise clear to auscultation.  No wheezing or rhonchi.  No increased work of breathing. Abdomen: Nondistended, soft and nontender.  No organomegaly or masses appreciated.  Normal bowel sounds heard. Extremities: No edema.  Bilateral heel floaters. Skin: Sacral decubitus wound-not examined by me today.  Refer to [facility] RN input from 7/18. Psych: Judgment and insight impaired.  Flat affect. CNS: Alert and oriented to self and family at  bedside.    Data Reviewed:   CBC: Recent Labs  Lab 10/14/21 1343 10/15/21 0543 10/17/21 0630  WBC 16.4* 9.5 5.2  NEUTROABS 14.7*  --   --   HGB 13.9 13.1 11.8*  HCT 42.0 39.8 36.4*  MCV 96.8 97.8 98.4  PLT 183 158 672   Basic Metabolic Panel: Recent Labs  Lab 10/14/21 1343 10/15/21 0543 10/17/21 0630  NA 140 139 138  K 3.6 3.3* 3.1*  CL 106 107 108  CO2 20* 25 23  GLUCOSE 165* 118* 128*  BUN 35* 25* 16  CREATININE 1.48* 0.86 0.40*  CALCIUM 9.4 9.1 8.5*  MG  --  2.0  --    GFR: Estimated Creatinine Clearance: 62.8 mL/min (A) (by C-G formula based on SCr of 0.4 mg/dL (L)). Liver Function Tests: Recent Labs  Lab 10/14/21 1343  AST 16  ALT 9  ALKPHOS 80  BILITOT 1.6*  PROT 7.2  ALBUMIN 3.6   Recent Labs  Lab 10/14/21 1343  LIPASE 20    CBG: Recent Labs  Lab 10/14/21 1407  GLUCAP 166*    Urine analysis:    Component Value Date/Time   COLORURINE YELLOW 10/14/2021 1343   APPEARANCEUR HAZY (A) 10/14/2021 1343   LABSPEC 1.008 10/14/2021 1343   PHURINE 6.0 10/14/2021 1343   GLUCOSEU NEGATIVE 10/14/2021 1343   HGBUR  MODERATE (A) 10/14/2021 1343   BILIRUBINUR NEGATIVE 10/14/2021 1343   KETONESUR NEGATIVE 10/14/2021 1343   PROTEINUR NEGATIVE 10/14/2021 1343   NITRITE NEGATIVE 10/14/2021 1343   LEUKOCYTESUR LARGE (A) 10/14/2021 1343    Recent Results (from the past 240 hour(s))  Urine Culture     Status: Abnormal   Collection Time: 10/14/21  1:43 PM   Specimen: Urine, Clean Catch  Result Value Ref Range Status   Specimen Description   Final    URINE, CLEAN CATCH Performed at [facility], [place] [address], [place], [place] [address]    Special Requests   Final    NONE Performed at [facility], [place] [address], [place], [place] [address]    Culture MULTIPLE SPECIES PRESENT, SUGGEST RECOLLECTION (A)  Final   Report Status 10/15/2021 FINAL  Final  Blood culture (routine x 2)     Status: None (Preliminary  result)   Collection Time: 10/14/21  1:45 PM   Specimen: BLOOD  Result Value Ref Range Status   Specimen Description   Final    BLOOD BLOOD LEFT FOREARM Performed at [facility] [address] [place], [place] [address]    Special Requests   Final    BOTTLES DRAWN AEROBIC AND ANAEROBIC Blood Culture adequate volume Performed at [facility] [address], [place], [place] [address]    Culture   Final    NO GROWTH 3 DAYS Performed at [facility], [place] [address] [place], [place] [address]    Report Status PENDING  Incomplete  Blood culture (routine x 2)     Status: None (Preliminary result)   Collection Time: 10/14/21  5:53 PM   Specimen: BLOOD  Result Value Ref Range Status   Specimen Description BLOOD RIGHT FOREARM  Final   Special Requests   Final    BOTTLES DRAWN AEROBIC AND ANAEROBIC Blood Culture results may not be optimal due to an excessive volume of blood received in culture bottles   Culture   Final    NO GROWTH 2 DAYS Performed at [facility], [place] [address], [place], [place] [address]    Report Status PENDING  Incomplete  Urine Culture     Status: None   Collection Time: 10/16/21  1:47 PM   Specimen: Urine, Clean Catch  Result Value Ref Range Status   Specimen Description   Final    URINE, CLEAN CATCH Performed at [facility], [place] [address], [place], [place] [address]    Special Requests   Final    NONE Performed at [facility], [place] [address], [place], [place] [address]    Culture   Final    NO GROWTH Performed at [facility], [place] [address], [place], [place] [address]    Report Status 10/17/2021 FINAL  Final     Radiology Studies: No results found.   Scheduled Meds:  aspirin EC  81 mg Oral Daily   atorvastatin  10 mg Oral Daily   carbidopa-levodopa  1 tablet Oral QHS   Carbidopa-Levodopa ER  1.5 tablet Oral QID   Chlorhexidine Gluconate Cloth   6 each Topical Daily   cholecalciferol  1,000 Units Oral Daily   enoxaparin (LOVENOX) injection  40 mg Subcutaneous Q24H   ferrous sulfate  325 mg Oral Q breakfast   mouth rinse  15 mL Mouth Rinse 4 times per day   polyethylene glycol  17 g  Oral BID   potassium chloride  40 mEq Oral Once   senna-docusate  1 tablet Oral BID   tamsulosin  0.4 mg Oral QPC supper   Continuous Infusions:  ampicillin-sulbactam (UNASYN) IV 3 g (10/17/21 1354)     LOS: 3 days    Vernell Leep, MD,  FACP, [facility], [facility], [facility] (Care Management Physician Certified) [place]  To contact the attending provider between 7A-7P or the covering provider during after hours 7P-7A, please log into the web site [email or web address] and access using universal [place] password for that web site. If you do not have the password, please call the hospital operator.    10/17/2021, 3:01 PM

## 2021-10-18 DIAGNOSIS — Z515 Encounter for palliative care: Secondary | ICD-10-CM | POA: Diagnosis not present

## 2021-10-18 DIAGNOSIS — Z7189 Other specified counseling: Secondary | ICD-10-CM

## 2021-10-18 DIAGNOSIS — N179 Acute kidney failure, unspecified: Secondary | ICD-10-CM | POA: Diagnosis not present

## 2021-10-18 DIAGNOSIS — E870 Hyperosmolality and hypernatremia: Secondary | ICD-10-CM

## 2021-10-18 DIAGNOSIS — G20 Parkinson's disease: Secondary | ICD-10-CM | POA: Diagnosis not present

## 2021-10-18 DIAGNOSIS — J690 Pneumonitis due to inhalation of food and vomit: Secondary | ICD-10-CM | POA: Diagnosis not present

## 2021-10-18 LAB — BASIC METABOLIC PANEL
Anion gap: 6 (ref 5–15)
BUN: 11 mg/dL (ref 8–23)
CO2: 28 mmol/L (ref 22–32)
Calcium: 8.9 mg/dL (ref 8.9–10.3)
Chloride: 114 mmol/L — ABNORMAL HIGH (ref 98–111)
Creatinine, Ser: 0.41 mg/dL — ABNORMAL LOW (ref 0.61–1.24)
GFR, Estimated: >60 mL/min (ref >60)
Glucose, Bld: 105 mg/dL — ABNORMAL HIGH (ref 70–99)
Potassium: 3.8 mmol/L (ref 3.5–5.1)
Sodium: 148 mmol/L — ABNORMAL HIGH (ref 135–145)

## 2021-10-18 LAB — CBC
HCT: 39.5 % (ref 39.0–52.0)
Hemoglobin: 12.7 g/dL — ABNORMAL LOW (ref 13.0–17.0)
MCH: 31.4 pg (ref 26.0–34.0)
MCHC: 32.2 g/dL (ref 30.0–36.0)
MCV: 97.8 fL (ref 80.0–100.0)
Platelets: 176 10*3/uL (ref 150–400)
RBC: 4.04 MIL/uL — ABNORMAL LOW (ref 4.22–5.81)
RDW: 14 % (ref 11.5–15.5)
WBC: 5.6 10*3/uL (ref 4.0–10.5)
nRBC: 0 % (ref 0.0–0.2)

## 2021-10-18 LAB — MAGNESIUM: Magnesium: 1.8 mg/dL (ref 1.7–2.4)

## 2021-10-18 MED ORDER — MAGNESIUM SULFATE 2 GM/50ML IV SOLN
2.0000 g | Freq: Once | INTRAVENOUS | Status: AC
  Administered 2021-10-18: 2 g via INTRAVENOUS
  Filled 2021-10-18: qty 50

## 2021-10-18 MED ORDER — SODIUM CHLORIDE 0.45 % IV SOLN: INTRAVENOUS | Status: DC

## 2021-10-18 MED ORDER — BISACODYL 10 MG RE SUPP: 10.0000 mg | Freq: Every day | RECTAL | Status: DC | PRN

## 2021-10-18 NOTE — Consult Note (Signed)
Consultation Note Date: 10/18/2021   Patient Name: Rodney Love  DOB: Oct 16, 1945  MRN: [id number]  Age / Sex: 76 y.o., male  PCP: Rodney Amel, MD Referring Physician: Geradine Girt, DO  Reason for Consultation: Establishing goals of care  HPI/Patient Profile: 76 y.o. male  with past medical history of Parkinson's disease, dementia, severe malnutrition, sacral decubitus wounds, type 2 diabetes mellitus, PAD, and hypothyroidism admitted on 10/14/2021 with lower abdominal distention and worsening cough. Found to have WBC 16.4, mild AKI, urinary retention with abnormal UA. CT abdomen pelvis noted large amount of stool distending abdomen, possible fecal impaction and distended bladder along with right lower lobe infiltrate versus atelectasis. He has been treated for UTI, acute urinary retention, aspiration pna, fecal impaction, and AKI. PMT consulted to discuss [place].   Clinical Assessment and Goals of Care: I have reviewed medical records including EPIC notes, labs and imaging, received report from RN, assessed the patient and then met with multiple family members to include wife, 2 daughters, and son  to discuss diagnosis prognosis, GOC, EOL wishes, disposition and options.  I introduced Palliative Medicine as specialized medical care for people living with serious illness. It focuses on providing relief from the symptoms and stress of a serious illness. The goal is to improve quality of life for both the patient and the family.  Family reports at baseline patient is nonambulatory. Family shares he has good days/bad days with cognitive function.    We discussed patient's current illness and what it means in the larger context of patient's on-going co-morbidities.  Natural disease trajectory and expectations at EOL were discussed. We discuss his neurological diseases are progressive and not reversible. We discuss that his current complications are r/t  his neurological diseases.   I attempted to elicit values and goals of care important to the patient.  Family shares they want him to be at home and to be comfortable.   The difference between aggressive medical intervention and comfort care was considered in light of the patient's goals of care.   Discussed with family the importance of continued conversation with family and the medical providers regarding overall plan of care and treatment options, ensuring decisions are within the context of the patient's values and GOCs.    Hospice and Palliative Care services outpatient were explained and offered. We discussed hospice care at home vs home  health. Family is unsure how they would like to proceed but they are open to speaking with hospice liaison to hear more about services.   Questions and concerns were addressed. The family was encouraged to call with questions or concerns.    Primary Decision Maker NEXT OF KIN spouse Rodney Love    SUMMARY OF RECOMMENDATIONS   Family considering hospice support at home - meeting with hospice liaison 7/21 Code status not yet addressed      Primary Diagnoses: Present on Admission:  AKI (acute kidney injury) ([place])  PD (Parkinson's disease) ([place])  PAD (peripheral artery disease) s/p remote left external iliac artery angioplasty  Essential hypertension  Acquired hypothyroidism  Decubitus ulcer of sacral area   I have reviewed the medical record, interviewed the patient and family, and examined the patient. The following aspects are pertinent.  Past Medical History:  Diagnosis Date   Arthritis    Constipation    OCCASIONAL   Coronary artery disease    Diabetes mellitus without complication (HCC)    Environmental allergies    Hyperlipidemia    Hypertension  Inguinal hernia    Iron deficiency    Nocturia    Parkinson disease (HCC)    Peripheral arterial disease ([place])    Social History   Socioeconomic History   Marital status:  Married    Spouse name: Rodney Love   Number of children: 3   Years of education: 9   Highest education level: 9th grade  Occupational History   Occupation: retired    Comment: Architect  Tobacco Use   Smoking status: Former    Types: Cigarettes    Quit date: 04/02/1991    Years since quitting: 30.5   Smokeless tobacco: Former    Quit date: 08/06/1991  Vaping Use   Vaping Use: Never used  Substance and Sexual Activity   Alcohol use: No   Drug use: No   Sexual activity: Not on file  Other Topics Concern   Not on file  Social History Narrative   Patient is right-handed. He lives with his wife Rodney Love in a one level home. He walks daily.   Social Determinants of Health   Financial Resource Strain: Not on file  Food Insecurity: Not on file  Transportation Needs: Not on file  Physical Activity: Not on file  Stress: Not on file  Social Connections: Not on file   Family History  Problem Relation Age of Onset   Diabetes Mother    Kidney disease Mother    Stroke Father    Stroke Brother    Cancer Sister    Cancer Sister    Diabetes Sister    Healthy Daughter    Healthy Daughter    Healthy Son    Scheduled Meds:  aspirin EC  81 mg Oral Daily   atorvastatin  10 mg Oral Daily   carbidopa-levodopa  1 tablet Oral QHS   Carbidopa-Levodopa ER  1.5 tablet Oral QID   Chlorhexidine Gluconate Cloth  6 each Topical Daily   cholecalciferol  1,000 Units Oral Daily   enoxaparin (LOVENOX) injection  40 mg Subcutaneous Q24H   ferrous sulfate  325 mg Oral Q breakfast   mouth rinse  15 mL Mouth Rinse 4 times per day   polyethylene glycol  17 g Oral BID   senna-docusate  1 tablet Oral BID   tamsulosin  0.4 mg Oral QPC supper   Continuous Infusions:  sodium chloride     ampicillin-sulbactam (UNASYN) IV 3 g (10/18/21 1313)   PRN Meds:.acetaminophen, bisacodyl, guaiFENesin, mouth rinse No Known Allergies Review of Systems  Unable to perform ROS: Mental status change    Physical  Exam Constitutional:      General: He is not in acute distress.    Appearance: He is ill-appearing.  Pulmonary:     Effort: Pulmonary effort is normal.  Skin:    General: Skin is warm and dry.  Neurological:     Mental Status: He is alert. He is disoriented.     Vital Signs: BP 117/75 (BP Location: Left Arm)   Pulse 76   Temp 98.1 F (36.7 C) (Oral)   Resp 16   Ht '6\' 1"'  (1.854 m)   Wt 58.3 kg   SpO2 99%   BMI 16.96 kg/m  Pain Scale: 0-10   Pain Score: 0-No pain   SpO2: SpO2: 99 % O2 Device:SpO2: 99 % O2 Flow Rate: .   IO: Intake/output summary:  Intake/Output Summary (Last 24 hours) at 10/18/2021 1439 Last data filed at 10/18/2021 1409 Gross per 24 hour  Intake 480 ml  Output 800  ml  Net -320 ml    LBM: Last BM Date : 10/17/21 Baseline Weight: Weight: 58.3 kg Most recent weight: Weight: 58.3 kg     Palliative Assessment/Data: PPS 30%     *Please note that this is a verbal dictation therefore any spelling or grammatical errors are due to the "[place]" system interpretation.   Juel Burrow, DNP, AGNP-C Palliative Medicine Team [phone] Pager: [phone]

## 2021-10-18 NOTE — Progress Notes (Signed)
Pt airbed alarming for maintenance needed, phone number provided called given work order: [id number] advising that someone would be out to service bed.

## 2021-10-18 NOTE — Progress Notes (Signed)
Maintenance for [employer] air bed phoned and advised that the bed will need to be switched out and bed will be picked up with portable equipment

## 2021-10-18 NOTE — Care Management Important Message (Signed)
Important Message  Patient Details IM Letter placed in Patients room. Name: Rodney Love MRN: [id number] Date of Birth: 1946-01-04   Medicare Important Message Given:  Yes     Kerin Salen 10/18/2021, 9:48 AM

## 2021-10-18 NOTE — Progress Notes (Signed)
PROGRESS NOTE    Rodney Love  BJY:[id number] DOB: [birth year] DOA: 10/14/2021 PCP: Lujean Amel, MD  76/M with history of Parkinson's disease, dementia, severe malnutrition, sacral decubitus wounds, type 2 diabetes mellitus, PAD, hypothyroidism was brought to the ED by his wife who is his primary caretaker, she noted mild lower abdominal distention with worsening of his chronic cough. -In the ED, had leukocytosis of 16.4, mild AKI, abnormal UA and urinary retention, had to have Foley catheter.  UA was abnormal, chest x-ray was concerning for free intraperitoneal  air and streaky right basilar opacities, CT abdomen pelvis noted large amount of stool distending abdomen, possible fecal impaction and distended bladder along with right lower lobe infiltrate versus atelectasis.  Treating for UTI, acute urinary retention, aspiration pneumonia and AKI. Palliative care consult pending   Subjective: Asking for a ginger ale  Assessment and Plan:  UTI Acute urinary retention -Initially got a dose of IV ceftriaxone and azithromycin which was then switched to IV Unasyn to cover aspiration pneumonia and possible UTI with retention -Urine culture with multiple species, repeat urine culture shows no growth.  UTI treated. -Due to difficulties with transportation, poor mobility family discussed with prior MDs and wanted to discontinue Foley catheter.  As per RN report, failed voiding trial and Foley catheter was placed back. -Started on Flomax, continue -Recommend outpatient follow-up with urology in 1 to 2 weeks for voiding trial. -Initiated Flomax 0.4 mg daily.  Mildly enlarged prostate on recent CT abdomen 7/16.  * AKI (acute kidney injury) ([place]) -Resolved with hydration  Aspiration pneumonia Chest x-ray showed right basilar opacity concerning for possible infiltrate.  Wife also mentions increased cough in the past 2 days.  -He is high aspiration risk with advanced Parkinson's and severe  malnutrition -ABX changed to Unasyn, day 2 -SLP evaluation noted moderate oropharyngeal dysphagia with silent aspiration of thin barium, recommended dysphagia 1 diet -Blood cultures x2: Negative to date.  No fevers, leukocytosis or hypoxia. Consider transitioning back to Augmentin .  Remains at high risk for recurrent aspiration going forward.   - need to consider palliative care consultation,   Ileus (HCC)/fecal impaction Likely due to his Parkinson's disease.  Also has large stool burden seen on CT abdomen/pelvis.   -Continue daily MiraLAX, senna - management as noted above. -KUB shows large stool burden in the rectum.  Enema ordered. -BM x 2  PD (Parkinson's disease) ([place]) -Continue Sinemet  Sacral decubitus wound, POA -WOC RN input appreciated.  Diabetes mellitus type 2 in nonobese (HCC) A.m. blood sugars on BMPs unremarkable.  A1c 5.5 in January.  Hypokalemia -Replace aggressively and follow -replace Mg as lower end of normal  Anemia: -stable  Hypernatremia -IVF -encourage PO intake  Palliative care consult pending  DVT prophylaxis: Lovenox Code Status: Full code Family Communication: Discussed with spouse and daughter Disposition Plan: family does not want SNF-- wife wants to take home  Consultants:  Palliative care medicine  Procedures: Foley catheter.   Objective: Vitals:   10/17/21 0702 10/17/21 1428 10/17/21 2128 10/18/21 0530  BP: 127/81 125/70 139/74 124/60  Pulse: (!) 50 72 (!) 56 67  Resp: '17  14 14  '$ Temp: 97.8 F (36.6 C) 98.3 F (36.8 C) 97.8 F (36.6 C) (!) 97.4 F (36.3 C)  TempSrc: Oral Oral Oral Oral  SpO2: 98% 99% 96% 99%  Weight:      Height:        Intake/Output Summary (Last 24 hours) at 10/18/2021 0865 Last data filed at  10/18/2021 0548 Gross per 24 hour  Intake --  Output 1400 ml  Net -1400 ml   Filed Weights   10/15/21 0556  Weight: 58.3 kg    Examination:   General: Appearance:    Frail male in no acute distress      Lungs:    respirations unlabored  Heart:    Normal heart rate.   MS:   All extremities are intact.   Neurologic:   Awake, alert, flat affect       Data Reviewed:   CBC: Recent Labs  Lab 10/14/21 1343 10/15/21 0543 10/17/21 0630 10/18/21 0547  WBC 16.4* 9.5 5.2 5.6  NEUTROABS 14.7*  --   --   --   HGB 13.9 13.1 11.8* 12.7*  HCT 42.0 39.8 36.4* 39.5  MCV 96.8 97.8 98.4 97.8  PLT 183 158 153 378   Basic Metabolic Panel: Recent Labs  Lab 10/14/21 1343 10/15/21 0543 10/17/21 0630 10/18/21 0547  NA 140 139 138 148*  K 3.6 3.3* 3.1* 3.8  CL 106 107 108 114*  CO2 20* '25 23 28  '$ GLUCOSE 165* 118* 128* 105*  BUN 35* 25* 16 11  CREATININE 1.48* 0.86 0.40* 0.41*  CALCIUM 9.4 9.1 8.5* 8.9  MG  --  2.0  --  1.8   GFR: Estimated Creatinine Clearance: 62.8 mL/min (A) (by C-G formula based on SCr of 0.41 mg/dL (L)). Liver Function Tests: Recent Labs  Lab 10/14/21 1343  AST 16  ALT 9  ALKPHOS 80  BILITOT 1.6*  PROT 7.2  ALBUMIN 3.6   Recent Labs  Lab 10/14/21 1343  LIPASE 20    CBG: Recent Labs  Lab 10/14/21 1407  GLUCAP 166*    Urine analysis:    Component Value Date/Time   COLORURINE YELLOW 10/14/2021 1343   APPEARANCEUR HAZY (A) 10/14/2021 1343   LABSPEC 1.008 10/14/2021 1343   PHURINE 6.0 10/14/2021 1343   GLUCOSEU NEGATIVE 10/14/2021 1343   HGBUR MODERATE (A) 10/14/2021 1343   BILIRUBINUR NEGATIVE 10/14/2021 1343   KETONESUR NEGATIVE 10/14/2021 1343   PROTEINUR NEGATIVE 10/14/2021 1343   NITRITE NEGATIVE 10/14/2021 1343   LEUKOCYTESUR LARGE (A) 10/14/2021 1343    Recent Results (from the past 240 hour(s))  Urine Culture     Status: Abnormal   Collection Time: 10/14/21  1:43 PM   Specimen: Urine, Clean Catch  Result Value Ref Range Status   Specimen Description   Final    URINE, CLEAN CATCH Performed at [facility], [place] [address] [place], [place] [address]    Special Requests   Final    NONE Performed at [facility], [place] [address], [place], [place] [address]    Culture MULTIPLE SPECIES PRESENT, SUGGEST RECOLLECTION (A)  Final   Report Status 10/15/2021 FINAL  Final  Blood culture (routine x 2)     Status: None (Preliminary result)   Collection Time: 10/14/21  1:45 PM   Specimen: BLOOD  Result Value Ref Range Status   Specimen Description   Final    BLOOD BLOOD LEFT FOREARM Performed at [place] [address] [place], [place] [address]    Special Requests   Final    BOTTLES DRAWN AEROBIC AND ANAEROBIC Blood Culture adequate volume Performed at [place] [address] [place], [place] [address]    Culture   Final    NO GROWTH 4 DAYS Performed at [facility], [place] [address]  [place], [place] [address]    Report Status PENDING  Incomplete  Blood culture (routine x 2)     Status: None (Preliminary result)   Collection Time: 10/14/21  5:53 PM   Specimen: BLOOD  Result Value Ref Range Status   Specimen Description BLOOD RIGHT FOREARM  Final   Special Requests   Final    BOTTLES DRAWN AEROBIC AND ANAEROBIC Blood Culture results may not be optimal due to an excessive volume of blood received in culture bottles   Culture   Final    NO GROWTH 3 DAYS Performed at [facility], [place] [address], [place], [place] [address]    Report Status PENDING  Incomplete  Urine Culture     Status: None   Collection Time: 10/16/21  1:47 PM   Specimen: Urine, Clean Catch  Result Value Ref Range Status   Specimen Description   Final    URINE, CLEAN CATCH Performed at [facility], [place] [address] [place], [place] [address]    Special Requests   Final    NONE Performed at [facility], [place] [address] [place], [place] [address]    Culture   Final    NO GROWTH Performed at [facility], [place] [address], [place], [place] [address]    Report Status 10/17/2021 FINAL  Final      Radiology Studies: DG Abd Portable 1V  Result Date: 10/17/2021 CLINICAL DATA:  Fecal impaction EXAM: PORTABLE ABDOMEN - 1 VIEW COMPARISON:  CT abdomen/pelvis dated 10/15/2018 FINDINGS: There is gaseous distention of the small bowel without evidence of mechanical obstruction. Enteric contrast is seen throughout the colon. There is a large stool burden projecting over the rectum. There is no definite free intraperitoneal air, within the confines of supine technique. There is scoliotic curvature and multilevel degenerative change of the lumbar spine. Right hip arthroplasty hardware is noted. The lung bases are clear. IMPRESSION: 1. Gaseous distention of the small bowel without evidence of mechanical obstruction. Enteric contrast seen throughout the colon. 2. Large stool burden in the rectum. Electronically Signed   By: Valetta Mole M.D.   On: 10/17/2021 16:03     Scheduled Meds:  aspirin EC  81 mg Oral Daily   atorvastatin  10 mg Oral Daily   carbidopa-levodopa  1 tablet Oral QHS   Carbidopa-Levodopa ER  1.5 tablet Oral QID   Chlorhexidine Gluconate Cloth  6 each Topical Daily   cholecalciferol  1,000 Units Oral Daily   enoxaparin (LOVENOX) injection  40 mg Subcutaneous Q24H   ferrous sulfate  325 mg Oral Q breakfast   mouth rinse  15 mL Mouth Rinse 4 times per day   polyethylene glycol  17 g Oral BID   senna-docusate  1 tablet Oral BID   tamsulosin  0.4 mg Oral QPC supper   Continuous Infusions:  sodium chloride     ampicillin-sulbactam (UNASYN) IV 3 g (10/18/21 0746)   magnesium sulfate bolus IVPB       LOS: 4 days    Geradine Girt, DO    To contact the attending provider between 7A-7P or the covering provider during after hours 7P-7A, please log into the web site [email or web address] and access using universal [place] password for that web site. If you do not have the password, please call the hospital operator.    10/18/2021, 9:29 AM

## 2021-10-19 DIAGNOSIS — Z515 Encounter for palliative care: Secondary | ICD-10-CM | POA: Diagnosis not present

## 2021-10-19 DIAGNOSIS — Z7189 Other specified counseling: Secondary | ICD-10-CM | POA: Diagnosis not present

## 2021-10-19 DIAGNOSIS — R627 Adult failure to thrive: Secondary | ICD-10-CM | POA: Diagnosis not present

## 2021-10-19 DIAGNOSIS — G20 Parkinson's disease: Secondary | ICD-10-CM | POA: Diagnosis not present

## 2021-10-19 LAB — BASIC METABOLIC PANEL
Anion gap: 6 (ref 5–15)
BUN: 8 mg/dL (ref 8–23)
CO2: 24 mmol/L (ref 22–32)
Calcium: 8.3 mg/dL — ABNORMAL LOW (ref 8.9–10.3)
Chloride: 112 mmol/L — ABNORMAL HIGH (ref 98–111)
Creatinine, Ser: 0.43 mg/dL — ABNORMAL LOW (ref 0.61–1.24)
GFR, Estimated: >60 mL/min (ref >60)
Glucose, Bld: 111 mg/dL — ABNORMAL HIGH (ref 70–99)
Potassium: 3.6 mmol/L (ref 3.5–5.1)
Sodium: 142 mmol/L (ref 135–145)

## 2021-10-19 LAB — CULTURE, BLOOD (ROUTINE X 2)
Culture: NO GROWTH
Special Requests: ADEQUATE

## 2021-10-19 MED ORDER — SODIUM CHLORIDE 0.45 % IV SOLN
INTRAVENOUS | Status: DC
  Filled 2021-10-19 (×5): qty 1000

## 2021-10-19 MED ORDER — QUETIAPINE FUMARATE 25 MG PO TABS
12.5000 mg | ORAL_TABLET | Freq: Every day | ORAL | Status: DC
  Administered 2021-10-19 – 2021-10-21 (×3): 12.5 mg via ORAL
  Filled 2021-10-19 (×3): qty 1

## 2021-10-19 NOTE — Progress Notes (Signed)
Daily Progress Note   Patient Name: Rodney Love       Date: 10/19/2021 DOB: 02/28/1946  Age: 76 y.o. MRN#: [id number] Attending Physician: Rodney Dike, MD Primary Care Physician: Rodney Amel, MD Admit Date: 10/14/2021  Reason for Consultation/Follow-up: Establishing goals of care  Subjective: Patient denies concerns but remains quite confused. Daughter Rodney Love at bedside.   Length of Stay: 5  Current Medications: Scheduled Meds:   aspirin EC  81 mg Oral Daily   atorvastatin  10 mg Oral Daily   carbidopa-levodopa  1 tablet Oral QHS   Carbidopa-Levodopa ER  1.5 tablet Oral QID   Chlorhexidine Gluconate Cloth  6 each Topical Daily   cholecalciferol  1,000 Units Oral Daily   enoxaparin (LOVENOX) injection  40 mg Subcutaneous Q24H   ferrous sulfate  325 mg Oral Q breakfast   mouth rinse  15 mL Mouth Rinse 4 times per day   polyethylene glycol  17 g Oral BID   senna-docusate  1 tablet Oral BID   tamsulosin  0.4 mg Oral QPC supper    Continuous Infusions:  sodium chloride 75 mL/hr at 10/19/21 0658   ampicillin-sulbactam (UNASYN) IV 3 g (10/19/21 0145)    PRN Meds: acetaminophen, bisacodyl, guaiFENesin, mouth rinse  Physical Exam Constitutional:      General: He is not in acute distress.    Appearance: He is ill-appearing.  Pulmonary:     Effort: Pulmonary effort is normal.  Skin:    General: Skin is warm and dry.  Neurological:     Mental Status: He is alert. He is disoriented.             Vital Signs: BP 113/73 (BP Location: Left Arm)   Pulse (!) 54   Temp 97.8 F (36.6 C) (Oral)   Resp 16   Ht '6\' 1"'$  (1.854 m)   Wt 58.3 kg   SpO2 100%   BMI 16.96 kg/m  SpO2: SpO2: 100 % O2 Device: O2 Device: Room Air O2 Flow Rate:    Intake/output summary:  Intake/Output Summary  (Last 24 hours) at 10/19/2021 1506 Last data filed at 10/19/2021 2197 Gross per 24 hour  Intake 120 ml  Output 550 ml  Net -430 ml   LBM: Last BM Date : 10/18/21 Baseline Weight: Weight: 58.3 kg Most recent weight: Weight: 58.3 kg       Palliative Assessment/Data: PPS 30%      Patient Active Problem List   Diagnosis Date Noted   Hypernatremia 10/18/2021   Decubitus ulcer of sacral area 10/16/2021   Protein-calorie malnutrition, severe ([place]) 10/16/2021   Failure to thrive in adult 10/16/2021   Acquired hypothyroidism 10/15/2021   AKI (acute Love injury) ([place]) 10/14/2021   Ileus ([place]) 10/14/2021   Community acquired pneumonia 10/14/2021   Acute urinary retention 10/14/2021   Pressure injury of skin 05/03/2020   Femur fracture, right ([place]) 04/28/2020   PD (Parkinson's disease) ([place]) 12/18/2015   Tremor of both hands 11/22/2015   PAD (peripheral artery disease) s/p remote left external iliac artery angioplasty 10/28/2012   Coronary atherosclerosis 10/28/2012   Hypercholesterolemia 10/28/2012   Essential hypertension 10/28/2012   Diabetes mellitus type 2 in nonobese ([place]) 10/28/2012  Carotid occlusion, right 10/28/2012    Palliative Care Assessment & Plan   HPI: 76 y.o. male  with past medical history of Parkinson's disease, dementia, severe malnutrition, sacral decubitus wounds, type 2 diabetes mellitus, PAD, and hypothyroidism admitted on 10/14/2021 with lower abdominal distention and worsening cough. Found to have WBC 16.4, mild AKI, urinary retention with abnormal UA. CT abdomen pelvis noted large amount of stool distending abdomen, possible fecal impaction and distended bladder along with right lower lobe infiltrate versus atelectasis. He has been treated for UTI, acute urinary retention, aspiration pna, fecal impaction, and AKI. PMT consulted to discuss [place].   Assessment: Follow up today with daughter Rodney Love.  We reviewed conversation from yesterday.  She has no  questions or concerns.  We reviewed family's conversation with hospice liaison this morning.  She tells me is very helpful and they feel more comfortable with the option of going home with hospice.  They have not made a final decision at this point and plan to meet again with hospice liaison tomorrow.  Also followed up with hospice liaison -during their conversation CODE STATUS was addressed and it was not felt that further CODE STATUS conversations would be fruitful at this time so I did not bring this up during my meeting with the family.  Recommendations/Plan: Family will continue discussions with hospice liaison Family has her contact information and will reach out if needed  Code Status: Full code  Care plan was discussed with patient's daughter Rodney Love and RN, hospice liaison  Thank you for allowing the Palliative Medicine Team to assist in the care of this patient.   *Please note that this is a verbal dictation therefore any spelling or grammatical errors are due to the "[place] [employer]" system interpretation.  Rodney Burrow, DNP, [facility] Palliative Medicine Team Team Phone # [phone]  Pager [phone]

## 2021-10-19 NOTE — Progress Notes (Signed)
[facility]) Hospice  Request received to meet with patient and family to discuss hospice.  Plan to meet with patient, wife, and daughter at 58 am today.  Thank you, Venia Carbon DNP, RN [facility] Liaison

## 2021-10-19 NOTE — Progress Notes (Signed)
PROGRESS NOTE    Rodney Love  MGQ:[id number] DOB: 01-Nov-1945 DOA: 10/14/2021 PCP: Lujean Amel, MD   76/M with history of Parkinson's disease, dementia, severe malnutrition, sacral decubitus wounds, type 2 diabetes mellitus, PAD, hypothyroidism was brought to the ED by his wife who is his primary caretaker, she noted mild lower abdominal distention with worsening of his chronic cough. -In the ED, had leukocytosis of 16.4, mild AKI, abnormal UA and urinary retention, had to have Foley catheter.  UA was abnormal, chest x-ray was concerning for free intraperitoneal  air and streaky right basilar opacities, CT abdomen pelvis noted large amount of stool distending abdomen, possible fecal impaction and distended bladder along with right lower lobe infiltrate versus atelectasis.  Treating for UTI, acute urinary retention, aspiration pneumonia and AKI. Palliative care following   Subjective: Sleeping on my arrival.  Family says that he is having bowel movements.  Assessment and Plan:  UTI Acute urinary retention -Initially got a dose of IV ceftriaxone and azithromycin which was then switched to IV Unasyn to cover aspiration pneumonia and possible UTI with retention -Urine culture with multiple species, repeat urine culture shows no growth.  UTI treated. -Due to difficulties with transportation, poor mobility family discussed with prior MDs and wanted to discontinue Foley catheter.  As per RN report, failed voiding trial and Foley catheter was placed back. -Started on Flomax, continue -Recommend outpatient follow-up with urology in 1 to 2 weeks for voiding trial. -Initiated Flomax 0.4 mg daily.  Mildly enlarged prostate on recent CT abdomen 7/16.  * AKI (acute kidney injury) ([place]) -Resolved with hydration  Aspiration pneumonia Chest x-ray showed right basilar opacity concerning for possible infiltrate.  Wife also mentions increased cough in the past 2 days.  -He is high aspiration risk with  advanced Parkinson's and severe malnutrition -ABX changed to Unasyn, day 4 -SLP evaluation noted moderate oropharyngeal dysphagia with silent aspiration of thin barium, recommended dysphagia 1 diet -Blood cultures x2: Negative to date.  No fevers, leukocytosis or hypoxia. Consider transitioning back to Augmentin .  Remains at high risk for recurrent aspiration going forward.     Ileus (HCC)/fecal impaction Likely due to his Parkinson's disease.  Also has large stool burden seen on CT abdomen/pelvis.   -Continue daily MiraLAX, senna - management as noted above. -KUB shows large stool burden in the rectum.  Enema ordered. -Appears to be having more regular bowel movements  PD (Parkinson's disease) ([place]) -Continue Sinemet  Sacral decubitus wound, POA -WOC RN input appreciated.  Diabetes mellitus type 2 in nonobese (HCC) A.m. blood sugars on BMPs unremarkable.  A1c 5.5 in January.  Hypokalemia -Replace aggressively and follow -replace Mg as lower end of normal  Anemia: -stable  Hypernatremia -IVF -encourage PO intake -Improved  Goals of care -Palliative care following, appreciate assistance -Family is considering discharging home with hospice services -At this point, he is a full code -Palliative care/hospice will continue to work with family regarding discussions around patient's wishes at end-of-life and goals of care  DVT prophylaxis: Lovenox Code Status: Full code Family Communication: Discussed with spouse and daughter Disposition Plan: family does not want SNF-- wife wants to take home  Consultants:  Palliative care medicine  Procedures: Foley catheter.   Objective: Vitals:   10/18/21 1836 10/18/21 2214 10/19/21 0638 10/19/21 1422  BP: 138/80 137/77 (!) 135/93 113/73  Pulse: 67 67 77 (!) 54  Resp:  '14 14 16  '$ Temp:  97.6 F (36.4 C) 98.4 F (36.9 C) 97.8  F (36.6 C)  TempSrc:  Oral Oral Oral  SpO2: 100% 99% 99% 100%  Weight:      Height:         Intake/Output Summary (Last 24 hours) at 10/19/2021 1853 Last data filed at 10/19/2021 1500 Gross per 24 hour  Intake 1655.78 ml  Output 550 ml  Net 1105.78 ml   Filed Weights   10/15/21 0556  Weight: 58.3 kg    Examination:   General: Appearance:    Frail male in no acute distress     Lungs:    respirations unlabored  Heart:  S1, S2, RRR  MS:   All extremities are intact.   Neurologic: Sleeping, does not wake up to my voice       Data Reviewed:   CBC: Recent Labs  Lab 10/14/21 1343 10/15/21 0543 10/17/21 0630 10/18/21 0547  WBC 16.4* 9.5 5.2 5.6  NEUTROABS 14.7*  --   --   --   HGB 13.9 13.1 11.8* 12.7*  HCT 42.0 39.8 36.4* 39.5  MCV 96.8 97.8 98.4 97.8  PLT 183 158 153 858   Basic Metabolic Panel: Recent Labs  Lab 10/14/21 1343 10/15/21 0543 10/17/21 0630 10/18/21 0547 10/19/21 0504  NA 140 139 138 148* 142  K 3.6 3.3* 3.1* 3.8 3.6  CL 106 107 108 114* 112*  CO2 20* '25 23 28 24  '$ GLUCOSE 165* 118* 128* 105* 111*  BUN 35* 25* '16 11 8  '$ CREATININE 1.48* 0.86 0.40* 0.41* 0.43*  CALCIUM 9.4 9.1 8.5* 8.9 8.3*  MG  --  2.0  --  1.8  --    GFR: Estimated Creatinine Clearance: 62.8 mL/min (A) (by C-G formula based on SCr of 0.43 mg/dL (L)). Liver Function Tests: Recent Labs  Lab 10/14/21 1343  AST 16  ALT 9  ALKPHOS 80  BILITOT 1.6*  PROT 7.2  ALBUMIN 3.6   Recent Labs  Lab 10/14/21 1343  LIPASE 20    CBG: Recent Labs  Lab 10/14/21 1407  GLUCAP 166*    Urine analysis:    Component Value Date/Time   COLORURINE YELLOW 10/14/2021 1343   APPEARANCEUR HAZY (A) 10/14/2021 1343   LABSPEC 1.008 10/14/2021 1343   PHURINE 6.0 10/14/2021 1343   GLUCOSEU NEGATIVE 10/14/2021 1343   HGBUR MODERATE (A) 10/14/2021 1343   BILIRUBINUR NEGATIVE 10/14/2021 1343   KETONESUR NEGATIVE 10/14/2021 1343   PROTEINUR NEGATIVE 10/14/2021 1343   NITRITE NEGATIVE 10/14/2021 1343   LEUKOCYTESUR LARGE (A) 10/14/2021 1343    Recent Results (from the past  240 hour(s))  Urine Culture     Status: Abnormal   Collection Time: 10/14/21  1:43 PM   Specimen: Urine, Clean Catch  Result Value Ref Range Status   Specimen Description   Final    URINE, CLEAN CATCH Performed at [facility], [place] [address], [place], [place] [address]    Special Requests   Final    NONE Performed at [facility], [place] [address] [place], [place] [address]    Culture MULTIPLE SPECIES PRESENT, SUGGEST RECOLLECTION (A)  Final   Report Status 10/15/2021 FINAL  Final  Blood culture (routine x 2)     Status: None   Collection Time: 10/14/21  1:45 PM   Specimen: BLOOD  Result Value Ref Range Status   Specimen Description   Final    BLOOD BLOOD LEFT FOREARM Performed at [place] [address] [place], [place] [address]    Special  Requests   Final    BOTTLES DRAWN AEROBIC AND ANAEROBIC Blood Culture adequate volume Performed at [place] [address] [place], [place] [address]    Culture   Final    NO GROWTH 5 DAYS Performed at [facility], [place] [address] [place], [place] [address]    Report Status 10/19/2021 FINAL  Final  Blood culture (routine x 2)     Status: None (Preliminary result)   Collection Time: 10/14/21  5:53 PM   Specimen: BLOOD  Result Value Ref Range Status   Specimen Description BLOOD RIGHT FOREARM  Final   Special Requests   Final    BOTTLES DRAWN AEROBIC AND ANAEROBIC Blood Culture results may not be optimal due to an excessive volume of blood received in culture bottles   Culture   Final    NO GROWTH 4 DAYS Performed at [facility], [place] [address] [place], [place] [address]    Report Status PENDING  Incomplete  Urine Culture     Status: None   Collection Time: 10/16/21  1:47 PM   Specimen: Urine, Clean Catch  Result Value Ref Range Status   Specimen Description   Final    URINE, CLEAN CATCH Performed at [facility],  [place] [address] [place], [place] [address]    Special Requests   Final    NONE Performed at [facility], [place] [address] [place], [place] [address]    Culture   Final    NO GROWTH Performed at [facility], [place] [address], [place], [place] [address]    Report Status 10/17/2021 FINAL  Final     Radiology Studies: No results found.   Scheduled Meds:  aspirin EC  81 mg Oral Daily   atorvastatin  10 mg Oral Daily   carbidopa-levodopa  1 tablet Oral QHS   Carbidopa-Levodopa ER  1.5 tablet Oral QID   Chlorhexidine Gluconate Cloth  6 each Topical Daily   cholecalciferol  1,000 Units Oral Daily   enoxaparin (LOVENOX) injection  40 mg Subcutaneous Q24H   ferrous sulfate  325 mg Oral Q breakfast   mouth rinse  15 mL Mouth Rinse 4 times per day   polyethylene glycol  17 g Oral BID   senna-docusate  1 tablet Oral BID   tamsulosin  0.4 mg Oral QPC supper   Continuous Infusions:  sodium chloride 75 mL/hr at 10/19/21 0658   ampicillin-sulbactam (UNASYN) IV 3 g (10/19/21 1609)     LOS: 5 days    Kathie Dike, MD    To contact the attending provider between 7A-7P or the covering provider during after hours 7P-7A, please log into the web site [email or web address] and access using universal [place] password for that web site. If you do not have the password, please call the hospital operator.    10/19/2021, 6:53 PM

## 2021-10-19 NOTE — Progress Notes (Signed)
SLP Cancellation Note  Patient Details Name: Rodney Love MRN: [id number] DOB: 12/31/1945   Cancelled treatment:       Reason Eval/Treat Not Completed: Other (comment) (pt and family meeting with hospice team at this time; will sign off)  Kathleen Lime, MS [place] Office [phone] Pager [phone]   [name] 10/19/2021, 9:49 AM

## 2021-10-19 NOTE — Progress Notes (Signed)
[employer] [facility]) Hospice  Requested to meet with the family of Rodney Love to discuss hospice services in the home.  Met with [name], two daughters and their son. Explained services in detail, provided support.  They are leaning towards hospice at discharge, but wanted to discuss as a family today and agreed for [facility] to reengage on Saturday morning.  DME in the home: fully adjustable hospital bed, hoyer lift, WC that the family owns  DME needed: none identified at this time. If they decide to go with hospice, they may need a new air mattress.   Discussed DNR status, currently they do not wish to discuss this further. [name] has made it clear to his family that he wants reasonable treatments to occur. Briefly discussed a MOST form and how it may be helpful in the future. Should they d/c with hospice services, we will continue these [place] conversations.   Thank you, Venia Carbon DNP, RN [facility] Liaison  (In [place] under hospice or [phone])

## 2021-10-20 DIAGNOSIS — R627 Adult failure to thrive: Secondary | ICD-10-CM | POA: Diagnosis not present

## 2021-10-20 DIAGNOSIS — G20 Parkinson's disease: Secondary | ICD-10-CM | POA: Diagnosis not present

## 2021-10-20 DIAGNOSIS — Z515 Encounter for palliative care: Secondary | ICD-10-CM | POA: Diagnosis not present

## 2021-10-20 DIAGNOSIS — Z7189 Other specified counseling: Secondary | ICD-10-CM | POA: Diagnosis not present

## 2021-10-20 LAB — MAGNESIUM: Magnesium: 2 mg/dL (ref 1.7–2.4)

## 2021-10-20 LAB — CULTURE, BLOOD (ROUTINE X 2): Culture: NO GROWTH

## 2021-10-20 LAB — BASIC METABOLIC PANEL
Anion gap: 6 (ref 5–15)
BUN: 6 mg/dL — ABNORMAL LOW (ref 8–23)
CO2: 23 mmol/L (ref 22–32)
Calcium: 8.5 mg/dL — ABNORMAL LOW (ref 8.9–10.3)
Chloride: 111 mmol/L (ref 98–111)
Creatinine, Ser: 0.5 mg/dL — ABNORMAL LOW (ref 0.61–1.24)
GFR, Estimated: >60 mL/min (ref >60)
Glucose, Bld: 94 mg/dL (ref 70–99)
Potassium: 3.7 mmol/L (ref 3.5–5.1)
Sodium: 140 mmol/L (ref 135–145)

## 2021-10-20 MED ORDER — GERHARDT'S BUTT CREAM
TOPICAL_CREAM | Freq: Four times a day (QID) | CUTANEOUS | Status: DC
Start: 2021-10-20 — End: 2021-10-22
  Filled 2021-10-20: qty 1

## 2021-10-20 MED ORDER — FOOD THICKENER (SIMPLYTHICK)
1.0000 | ORAL | Status: DC | PRN
  Filled 2021-10-20 (×11): qty 1

## 2021-10-20 NOTE — Evaluation (Addendum)
Clinical/Bedside Swallow Evaluation Patient Details  Name: Rodney Love MRN: [id number] Date of Birth: [birth year]  Today's Date: 10/20/2021 Time: SLP Start Time (ACUTE ONLY): 83 SLP Stop Time (ACUTE ONLY): 0249 SLP Time Calculation (min) (ACUTE ONLY): 15 min  Past Medical History:  Past Medical History:  Diagnosis Date   Arthritis    Constipation    OCCASIONAL   Coronary artery disease    Diabetes mellitus without complication ([place])    Environmental allergies    Hyperlipidemia    Hypertension    Inguinal hernia    Iron deficiency    Nocturia    Parkinson disease ([place])    Peripheral arterial disease ([place])    Past Surgical History:  Past Surgical History:  Procedure Laterality Date   BACK SURGERY     CARDIAC CATHETERIZATION  05/29/2006   single vessel CAD w/moderate stenosis of the prox. RCA approx 40%,prox. LAD 20-30%,minor irreg. mid abdominal aorta,left iliac artery disease 50-60%   CATARACT EXTRACTION Left 12/2016   HERNIA REPAIR  [id number]   INGUINAL HERNIA REPAIR N/A 09/23/2012   Procedure: LAPAROSCOPIC INGUINAL HERNIA with umbilical hernia;  Surgeon: Madilyn Hook, DO;  Location: [facility];  Service: General;  Laterality: N/A;   INSERTION OF MESH Bilateral 09/23/2012   Procedure: INSERTION OF MESH;  Surgeon: Madilyn Hook, DO;  Location: [facility];  Service: General;  Laterality: Bilateral;   TOTAL HIP ARTHROPLASTY Right 04/29/2020   Procedure: TOTAL HIP ARTHROPLASTY ANTERIOR APPROACH;  Surgeon: Rod Can, MD;  Location: [facility];  Service: Orthopedics;  Laterality: Right;   HPI:  Rodney Love is a 76 y.o. male with medical history significant of Parkinson's, type 2 diabetes, PAD, hypothyroidism and hyperlipidemia who presents with abdominal distention, bedbound and mostly non-verbal due to Parkinson's. per chart. Pt has chronic cough but has noticed that it has been worse in the past 2 days and productive. Found to have AKI, urinary retention, pneumonia and suspected ileus  with full liquid diet (MD stated does not suspect he has an ileus).    Assessment / Plan / Recommendation  Clinical Impression  Pt reassessed for dysphagai.  New consult received with reports of increased coughing with PO intake, which wife endorses.  Pt consumed trials of puree texture and nectar thick liquid by straw.  Pt required 3-4 swallows with each sip of nectar and 1-2 swallows with puree.  Pt's performance seems consistent with results from MBSS completed on 7/17, although increased silent aspiration cannot be ruled out at bedside. There is no new chest imaging since completion of MBS.  Pt exhibited excellent oral clearance of puree texture independently.  Provided education to family reinforcing safe swallow strategies (multiple swallows, small bites/sips, alternating textures) and thickener use (1 packet per 4 oz, and supplements should be thickened as well; of note, pt is receiving miralax which cannot be thickenend, but family reports that it is being administered mixed in puree, which should be safe for pt to consume).  SLP will continue to follow for diet tolerance and possible advancement, and pt/family education during ongoing goals of care discussions.    Recommend continuing puree diet with nectar thick liquid.  SLP Visit Diagnosis: Dysphagia, oropharyngeal phase (R13.12)    Aspiration Risk  Moderate aspiration risk    Diet Recommendation Dysphagia 1 (Puree);Nectar-thick liquid   Liquid Administration via: Cup;Straw Medication Administration: Crushed with puree Supervision: Staff to assist with self feeding Compensations: Slow rate;Small sips/bites;Clear throat intermittently;Multiple dry swallows after each bite/sip (alternate solids/liquids as needed) Postural  Changes: Seated upright at 90 degrees    Other  Recommendations Oral Care Recommendations: Oral care BID Other Recommendations: Order thickener from pharmacy    Recommendations for follow up therapy are one  component of a multi-disciplinary discharge planning process, led by the attending physician.  Recommendations may be updated based on patient status, additional functional criteria and insurance authorization.  Follow up Recommendations  (Pending goals of care determination)      Assistance Recommended at Discharge Frequent or constant Supervision/Assistance  Functional Status Assessment Patient has had a recent decline in their functional status and demonstrates the ability to make significant improvements in function in a reasonable and predictable amount of time.  Frequency and Duration min 2x/week  2 weeks       Prognosis Prognosis for Safe Diet Advancement: Fair      Swallow Study   General Date of Onset: 10/14/21 HPI: Rodney Love is a 76 y.o. male with medical history significant of Parkinson's, type 2 diabetes, PAD, hypothyroidism and hyperlipidemia who presents with abdominal distention, bedbound and mostly non-verbal due to Parkinson's. per chart. Pt has chronic cough but has noticed that it has been worse in the past 2 days and productive. Found to have AKI, urinary retention, pneumonia and suspected ileus with full liquid diet (MD stated does not suspect he has an ileus). Type of Study: Bedside Swallow Evaluation Previous Swallow Assessment: MBS 7/17 Diet Prior to this Study: Dysphagia 1 (puree);Nectar-thick liquids Temperature Spikes Noted: No Respiratory Status: Room air History of Recent Intubation: No Behavior/Cognition: Alert;Cooperative;Pleasant mood Oral Care Completed by SLP: No Oral Cavity - Dentition: Dentures, top Self-Feeding Abilities: Needs assist Patient Positioning: Upright in bed Baseline Vocal Quality: Breathy;Low vocal intensity    Oral/Motor/Sensory Function     Ice Chips Ice chips: Not tested   Thin Liquid Thin Liquid: Not tested    Nectar Thick Nectar Thick Liquid: Within functional limits Presentation: Straw   Honey Thick Honey Thick  Liquid: Not tested   Puree Puree: Within functional limits Presentation: Spoon   Solid     Solid: Not tested      Celedonio Savage, [place], [place] Office: [phone] 10/20/2021,3:13 PM

## 2021-10-20 NOTE — Progress Notes (Signed)
[id number] [facility]) Hospital Hospice Liaison Note  Met with spouse, [name], to discuss Hospice services and spouse has not had discussion with her children yet, and wants to discuss with them first.   [place] will continue to follow for discharge disposition.  Thank you,  Bea Laura MSN, RN [facility] Liaison [phone]

## 2021-10-20 NOTE — Progress Notes (Signed)
PROGRESS NOTE    Rodney Love  QQI:[id number] DOB: June 11, 1945 DOA: 10/14/2021 PCP: Lujean Amel, MD   76/M with history of Parkinson's disease, dementia, severe malnutrition, sacral decubitus wounds, type 2 diabetes mellitus, PAD, hypothyroidism was brought to the ED by his wife who is his primary caretaker, she noted mild lower abdominal distention with worsening of his chronic cough. -In the ED, had leukocytosis of 16.4, mild AKI, abnormal UA and urinary retention, had to have Foley catheter.  UA was abnormal, chest x-ray was concerning for free intraperitoneal  air and streaky right basilar opacities, CT abdomen pelvis noted large amount of stool distending abdomen, possible fecal impaction and distended bladder along with right lower lobe infiltrate versus atelectasis.  Treating for UTI, acute urinary retention, aspiration pneumonia and AKI. Palliative care following   Subjective: Patient slept reasonably well overnight.  Family feels that he is doing well today.  Assessment and Plan:  UTI Acute urinary retention -Initially got a dose of IV ceftriaxone and azithromycin which was then switched to IV Unasyn to cover aspiration pneumonia and possible UTI with retention -Urine culture with multiple species, repeat urine culture shows no growth.  UTI treated. -Due to difficulties with transportation, poor mobility family discussed with prior MDs and wanted to discontinue Foley catheter.  As per RN report, failed voiding trial and Foley catheter was placed back. -Started on Flomax, continue -Recommend outpatient follow-up with urology in 1 to 2 weeks for voiding trial. -Initiated Flomax 0.4 mg daily.  Mildly enlarged prostate on recent CT abdomen 7/16.  * AKI (acute kidney injury) ([place]) -Resolved with hydration  Aspiration pneumonia Chest x-ray showed right basilar opacity concerning for possible infiltrate.  Wife also mentions increased cough in the past 2 days.  -He is high  aspiration risk with advanced Parkinson's and severe malnutrition -ABX changed to Unasyn, day 5 -SLP evaluation noted moderate oropharyngeal dysphagia with silent aspiration of thin barium, recommended dysphagia 1 diet with nectar thick liquids -Blood cultures x2: Negative to date.  No fevers, leukocytosis or hypoxia. Consider transitioning back to Augmentin .  Remains at high risk for recurrent aspiration going forward.     Ileus (HCC)/fecal impaction Likely due to his Parkinson's disease.  Also has large stool burden seen on CT abdomen/pelvis.   -Continue daily MiraLAX, senna - management as noted above. -KUB shows large stool burden in the rectum.  Enema ordered. -Appears to be having more regular bowel movements  PD (Parkinson's disease) ([place]) -Continue Sinemet  Sacral decubitus wound, POA -WOC RN input appreciated.  Diabetes mellitus type 2 in nonobese (HCC) A.m. blood sugars on BMPs unremarkable.  A1c 5.5 in January.  Hypokalemia -Replaced  Anemia: -stable  Hypernatremia -IVF -encourage PO intake -Improved  Goals of care -Palliative care following, appreciate assistance -Family is considering discharging home with hospice services -At this point, he is a full code -Palliative care/hospice will continue to work with family regarding discussions around patient's wishes at end-of-life and goals of care -At this point, it appears that they wish to continue with therapies including speech therapy and physical therapy.  Unclear if this can be continued with hospice services after discharge.  DVT prophylaxis: Lovenox Code Status: Full code Family Communication: Discussed with spouse and daughter Disposition Plan: family does not want SNF-- wife wants to take home  Consultants:  Palliative care medicine  Procedures: Foley catheter.   Objective: Vitals:   10/19/21 1422 10/19/21 2221 10/20/21 0632 10/20/21 1430  BP: 113/73 107/63 134/65 (!) 117/58  Pulse: (!) 54  (!) 59 (!) 48 65  Resp: '16 16 18 16  '$ Temp: 97.8 F (36.6 C) 97.6 F (36.4 C) 97.6 F (36.4 C) 98.4 F (36.9 C)  TempSrc: Oral Oral Oral Oral  SpO2: 100% 95% 98% 98%  Weight:      Height:        Intake/Output Summary (Last 24 hours) at 10/20/2021 1856 Last data filed at 10/20/2021 1848 Gross per 24 hour  Intake 120 ml  Output 600 ml  Net -480 ml   Filed Weights   10/15/21 0556  Weight: 58.3 kg    Examination:  General exam: Alert, awake, no distress Respiratory system: Clear to auscultation. Respiratory effort normal. Cardiovascular system:RRR. No murmurs, rubs, gallops. Gastrointestinal system: Abdomen is nondistended, soft and nontender. No organomegaly or masses felt. Normal bowel sounds heard. Central nervous system: No focal neurological deficits. Extremities: No C/C/E, +pedal pulses Skin: No rashes, lesions or ulcers Psychiatry: Pleasant, confused       Data Reviewed:   CBC: Recent Labs  Lab 10/14/21 1343 10/15/21 0543 10/17/21 0630 10/18/21 0547  WBC 16.4* 9.5 5.2 5.6  NEUTROABS 14.7*  --   --   --   HGB 13.9 13.1 11.8* 12.7*  HCT 42.0 39.8 36.4* 39.5  MCV 96.8 97.8 98.4 97.8  PLT 183 158 153 884   Basic Metabolic Panel: Recent Labs  Lab 10/15/21 0543 10/17/21 0630 10/18/21 0547 10/19/21 0504 10/20/21 0710  NA 139 138 148* 142 140  K 3.3* 3.1* 3.8 3.6 3.7  CL 107 108 114* 112* 111  CO2 '25 23 28 24 23  '$ GLUCOSE 118* 128* 105* 111* 94  BUN 25* '16 11 8 '$ 6*  CREATININE 0.86 0.40* 0.41* 0.43* 0.50*  CALCIUM 9.1 8.5* 8.9 8.3* 8.5*  MG 2.0  --  1.8  --  2.0   GFR: Estimated Creatinine Clearance: 62.8 mL/min (A) (by C-G formula based on SCr of 0.5 mg/dL (L)). Liver Function Tests: Recent Labs  Lab 10/14/21 1343  AST 16  ALT 9  ALKPHOS 80  BILITOT 1.6*  PROT 7.2  ALBUMIN 3.6   Recent Labs  Lab 10/14/21 1343  LIPASE 20    CBG: Recent Labs  Lab 10/14/21 1407  GLUCAP 166*    Urine analysis:    Component Value Date/Time    COLORURINE YELLOW 10/14/2021 1343   APPEARANCEUR HAZY (A) 10/14/2021 1343   LABSPEC 1.008 10/14/2021 1343   PHURINE 6.0 10/14/2021 1343   GLUCOSEU NEGATIVE 10/14/2021 1343   HGBUR MODERATE (A) 10/14/2021 1343   BILIRUBINUR NEGATIVE 10/14/2021 1343   KETONESUR NEGATIVE 10/14/2021 1343   PROTEINUR NEGATIVE 10/14/2021 1343   NITRITE NEGATIVE 10/14/2021 1343   LEUKOCYTESUR LARGE (A) 10/14/2021 1343    Recent Results (from the past 240 hour(s))  Urine Culture     Status: Abnormal   Collection Time: 10/14/21  1:43 PM   Specimen: Urine, Clean Catch  Result Value Ref Range Status   Specimen Description   Final    URINE, CLEAN CATCH Performed at [facility], [place] [address], [place], [place] [address]    Special Requests   Final    NONE Performed at [facility], [place] [address], [place], [place] [address]    Culture MULTIPLE SPECIES PRESENT, SUGGEST RECOLLECTION (A)  Final   Report Status 10/15/2021 FINAL  Final  Blood culture (routine x 2)     Status: None   Collection Time: 10/14/21  1:45 PM   Specimen:  BLOOD  Result Value Ref Range Status   Specimen Description   Final    BLOOD BLOOD LEFT FOREARM Performed at [facility] [address] [place], [place] [address]    Special Requests   Final    BOTTLES DRAWN AEROBIC AND ANAEROBIC Blood Culture adequate volume Performed at [facility] [address], [place], [place] [address]    Culture   Final    NO GROWTH 5 DAYS Performed at [facility], [place] [address], [place], [place] [address]    Report Status 10/19/2021 FINAL  Final  Blood culture (routine x 2)     Status: None   Collection Time: 10/14/21  5:53 PM   Specimen: BLOOD  Result Value Ref Range Status   Specimen Description BLOOD RIGHT FOREARM  Final   Special Requests   Final    BOTTLES DRAWN AEROBIC AND ANAEROBIC Blood Culture results may not be optimal due to an excessive volume of  blood received in culture bottles   Culture   Final    NO GROWTH 5 DAYS Performed at [facility], [place] [address], [place], [place] [address]    Report Status 10/20/2021 FINAL  Final  Urine Culture     Status: None   Collection Time: 10/16/21  1:47 PM   Specimen: Urine, Clean Catch  Result Value Ref Range Status   Specimen Description   Final    URINE, CLEAN CATCH Performed at [facility], [place] [address], [place], [place] [address]    Special Requests   Final    NONE Performed at [facility], [place] [address], [place], [place] [address]    Culture   Final    NO GROWTH Performed at [facility], [place] [address] [place], [place] [address]    Report Status 10/17/2021 FINAL  Final     Radiology Studies: No results found.   Scheduled Meds:  aspirin EC  81 mg Oral Daily   atorvastatin  10 mg Oral Daily   carbidopa-levodopa  1 tablet Oral QHS   Carbidopa-Levodopa ER  1.5 tablet Oral QID   Chlorhexidine Gluconate Cloth  6 each Topical Daily   cholecalciferol  1,000 Units Oral Daily   enoxaparin (LOVENOX) injection  40 mg Subcutaneous Q24H   ferrous sulfate  325 mg Oral Q breakfast   Gerhardt's butt cream   Topical QID   mouth rinse  15 mL Mouth Rinse 4 times per day   polyethylene glycol  17 g Oral BID   QUEtiapine  12.5 mg Oral Daily   senna-docusate  1 tablet Oral BID   tamsulosin  0.4 mg Oral QPC supper   Continuous Infusions:  ampicillin-sulbactam (UNASYN) IV 3 g (10/20/21 1357)   sodium chloride 0.45 % 1,000 mL with potassium chloride 40 mEq infusion 75 mL/hr at 10/20/21 1556     LOS: 6 days    Kathie Dike, MD    To contact the attending provider between 7A-7P or the covering provider during after hours 7P-7A, please log into the web site [email or web address] and access using universal [place] password for that web site. If you do not have the password, please call the hospital operator.    10/20/2021, 6:56 PM

## 2021-10-21 DIAGNOSIS — Z515 Encounter for palliative care: Secondary | ICD-10-CM | POA: Diagnosis not present

## 2021-10-21 DIAGNOSIS — Z7189 Other specified counseling: Secondary | ICD-10-CM | POA: Diagnosis not present

## 2021-10-21 DIAGNOSIS — R627 Adult failure to thrive: Secondary | ICD-10-CM | POA: Diagnosis not present

## 2021-10-21 DIAGNOSIS — G20 Parkinson's disease: Secondary | ICD-10-CM | POA: Diagnosis not present

## 2021-10-21 LAB — CBC
HCT: 38.6 % — ABNORMAL LOW (ref 39.0–52.0)
Hemoglobin: 12.5 g/dL — ABNORMAL LOW (ref 13.0–17.0)
MCH: 31.6 pg (ref 26.0–34.0)
MCHC: 32.4 g/dL (ref 30.0–36.0)
MCV: 97.5 fL (ref 80.0–100.0)
Platelets: 222 10*3/uL (ref 150–400)
RBC: 3.96 MIL/uL — ABNORMAL LOW (ref 4.22–5.81)
RDW: 13.8 % (ref 11.5–15.5)
WBC: 5.9 10*3/uL (ref 4.0–10.5)
nRBC: 0 % (ref 0.0–0.2)

## 2021-10-21 LAB — BASIC METABOLIC PANEL
Anion gap: 5 (ref 5–15)
BUN: 6 mg/dL — ABNORMAL LOW (ref 8–23)
CO2: 26 mmol/L (ref 22–32)
Calcium: 8.9 mg/dL (ref 8.9–10.3)
Chloride: 113 mmol/L — ABNORMAL HIGH (ref 98–111)
Creatinine, Ser: 0.48 mg/dL — ABNORMAL LOW (ref 0.61–1.24)
GFR, Estimated: >60 mL/min (ref >60)
Glucose, Bld: 100 mg/dL — ABNORMAL HIGH (ref 70–99)
Potassium: 3.8 mmol/L (ref 3.5–5.1)
Sodium: 144 mmol/L (ref 135–145)

## 2021-10-21 MED ORDER — NEPRO/CARBSTEADY PO LIQD
237.0000 mL | Freq: Two times a day (BID) | ORAL | Status: DC
  Administered 2021-10-21 – 2021-10-22 (×3): 237 mL via ORAL
  Filled 2021-10-21 (×3): qty 237

## 2021-10-21 MED ORDER — TIZANIDINE HCL 4 MG PO TABS
4.0000 mg | ORAL_TABLET | Freq: Four times a day (QID) | ORAL | Status: DC | PRN
  Administered 2021-10-21: 4 mg via ORAL
  Filled 2021-10-21: qty 1

## 2021-10-21 MED ORDER — ADULT MULTIVITAMIN W/MINERALS CH
1.0000 | ORAL_TABLET | Freq: Every day | ORAL | Status: DC
Start: 2021-10-21 — End: 2021-10-22
  Administered 2021-10-21 – 2021-10-22 (×2): 1 via ORAL
  Filled 2021-10-21 (×2): qty 1

## 2021-10-21 NOTE — Progress Notes (Signed)
PROGRESS NOTE    Rodney Love  NTI:[id number] DOB: July 13, 1945 DOA: 10/14/2021 PCP: Lujean Amel, MD   76/M with history of Parkinson's disease, dementia, severe malnutrition, sacral decubitus wounds, type 2 diabetes mellitus, PAD, hypothyroidism was brought to the ED by his wife who is his primary caretaker, she noted mild lower abdominal distention with worsening of his chronic cough. -In the ED, had leukocytosis of 16.4, mild AKI, abnormal UA and urinary retention, had to have Foley catheter.  UA was abnormal, chest x-ray was concerning for free intraperitoneal  air and streaky right basilar opacities, CT abdomen pelvis noted large amount of stool distending abdomen, possible fecal impaction and distended bladder along with right lower lobe infiltrate versus atelectasis.  Treating for UTI, acute urinary retention, aspiration pneumonia and AKI. Palliative care following   Subjective: Family has been noting that he has been complaining more of muscle cramps in his arms and legs today.  They have been trying to massage his legs without much benefit.  Assessment and Plan:  UTI Acute urinary retention -Initially got a dose of IV ceftriaxone and azithromycin which was then switched to IV Unasyn to cover aspiration pneumonia and possible UTI with retention -Urine culture with multiple species, repeat urine culture shows no growth.  UTI treated. -Due to difficulties with transportation, poor mobility family discussed with prior MDs and wanted to discontinue Foley catheter.  As per RN report, failed voiding trial and Foley catheter was placed back. -Started on Flomax, continue -Recommend outpatient follow-up with urology in 1 to 2 weeks for voiding trial. -Initiated Flomax 0.4 mg daily.  Mildly enlarged prostate on recent CT abdomen 7/16.  * AKI (acute kidney injury) ([place]) -Resolved with hydration  Aspiration pneumonia Chest x-ray showed right basilar opacity concerning for possible  infiltrate.  Wife also mentions increased cough in the past 2 days.  -He is high aspiration risk with advanced Parkinson's and severe malnutrition -ABX changed to Unasyn, day 5 -SLP evaluation noted moderate oropharyngeal dysphagia with silent aspiration of thin barium, recommended dysphagia 1 diet with nectar thick liquids -Blood cultures x2: Negative to date.  No fevers, leukocytosis or hypoxia. Consider transitioning back to Augmentin .  Remains at high risk for recurrent aspiration going forward.     Ileus (HCC)/fecal impaction Likely due to his Parkinson's disease.  Also has large stool burden seen on CT abdomen/pelvis.   -Continue daily MiraLAX, senna - management as noted above. -KUB shows large stool burden in the rectum.  Enema ordered. -Appears to be having more regular bowel movements  PD (Parkinson's disease) ([place]) -Continue Sinemet -having significant muscle cramps, will try tizanidine  Sacral decubitus wound, POA -WOC RN input appreciated.  Diabetes mellitus type 2 in nonobese (HCC) A.m. blood sugars on BMPs unremarkable.  A1c 5.5 in January.  Hypokalemia -Replaced  Anemia: -stable  Hypernatremia -IVF -encourage PO intake -Improved  Goals of care -Palliative care following, appreciate assistance -Family is considering discharging home with hospice services -At this point, he is a full code -Palliative care/hospice will continue to work with family regarding discussions around patient's wishes at end-of-life and goals of care -At this point, it appears that they wish to continue with therapies including speech therapy and physical therapy.  Unclear if this can be continued with hospice services after discharge.  DVT prophylaxis: Lovenox Code Status: Full code Family Communication: Discussed with spouse and daughter Disposition Plan: family does not want SNF-- wife wants to take home  Consultants:  Palliative care medicine  Procedures: Foley  catheter.   Objective: Vitals:   10/20/21 1430 10/20/21 2221 10/21/21 0529 10/21/21 1347  BP: (!) 117/58 127/76 133/74 110/68  Pulse: 65 83 (!) 56 79  Resp: '16 18 16 18  '$ Temp: 98.4 F (36.9 C) (!) 97.1 F (36.2 C) 97.6 F (36.4 C) 98 F (36.7 C)  TempSrc: Oral Axillary Oral Oral  SpO2: 98% 98% 93% 99%  Weight:      Height:        Intake/Output Summary (Last 24 hours) at 10/21/2021 1739 Last data filed at 10/21/2021 1348 Gross per 24 hour  Intake 2257.5 ml  Output 2250 ml  Net 7.5 ml   Filed Weights   10/15/21 0556  Weight: 58.3 kg    Examination:  General exam: Alert, awake, no distress Respiratory system: Clear to auscultation. Respiratory effort normal. Cardiovascular system:RRR. No murmurs, rubs, gallops. Gastrointestinal system: Abdomen is nondistended, soft and nontender. No organomegaly or masses felt. Normal bowel sounds heard. Central nervous system: No focal neurological deficits. Increased tone related to parkinsons Extremities: No C/C/E, +pedal pulses Skin: No rashes, lesions or ulcers Psychiatry: Pleasant, confused       Data Reviewed:   CBC: Recent Labs  Lab 10/15/21 0543 10/17/21 0630 10/18/21 0547 10/21/21 0611  WBC 9.5 5.2 5.6 5.9  HGB 13.1 11.8* 12.7* 12.5*  HCT 39.8 36.4* 39.5 38.6*  MCV 97.8 98.4 97.8 97.5  PLT 158 153 176 979   Basic Metabolic Panel: Recent Labs  Lab 10/15/21 0543 10/17/21 0630 10/18/21 0547 10/19/21 0504 10/20/21 0710 10/21/21 0611  NA 139 138 148* 142 140 144  K 3.3* 3.1* 3.8 3.6 3.7 3.8  CL 107 108 114* 112* 111 113*  CO2 '25 23 28 24 23 26  '$ GLUCOSE 118* 128* 105* 111* 94 100*  BUN 25* '16 11 8 '$ 6* 6*  CREATININE 0.86 0.40* 0.41* 0.43* 0.50* 0.48*  CALCIUM 9.1 8.5* 8.9 8.3* 8.5* 8.9  MG 2.0  --  1.8  --  2.0  --    GFR: Estimated Creatinine Clearance: 62.8 mL/min (A) (by C-G formula based on SCr of 0.48 mg/dL (L)). Liver Function Tests: No results for input(s): "AST", "ALT", "ALKPHOS", "BILITOT",  "PROT", "ALBUMIN" in the last 168 hours.  No results for input(s): "LIPASE", "AMYLASE" in the last 168 hours.   CBG: No results for input(s): "GLUCAP" in the last 168 hours.   Urine analysis:    Component Value Date/Time   COLORURINE YELLOW 10/14/2021 1343   APPEARANCEUR HAZY (A) 10/14/2021 1343   LABSPEC 1.008 10/14/2021 1343   PHURINE 6.0 10/14/2021 1343   GLUCOSEU NEGATIVE 10/14/2021 1343   HGBUR MODERATE (A) 10/14/2021 1343   BILIRUBINUR NEGATIVE 10/14/2021 1343   KETONESUR NEGATIVE 10/14/2021 1343   PROTEINUR NEGATIVE 10/14/2021 1343   NITRITE NEGATIVE 10/14/2021 1343   LEUKOCYTESUR LARGE (A) 10/14/2021 1343    Recent Results (from the past 240 hour(s))  Urine Culture     Status: Abnormal   Collection Time: 10/14/21  1:43 PM   Specimen: Urine, Clean Catch  Result Value Ref Range Status   Specimen Description   Final    URINE, CLEAN CATCH Performed at [facility], [place] [address] [place], [place] [address]    Special Requests   Final    NONE Performed at [facility], [place] [address] [place], [place] [address]    Culture MULTIPLE SPECIES PRESENT, SUGGEST RECOLLECTION (A)  Final   Report Status 10/15/2021 FINAL  Final  Blood  culture (routine x 2)     Status: None   Collection Time: 10/14/21  1:45 PM   Specimen: BLOOD  Result Value Ref Range Status   Specimen Description   Final    BLOOD BLOOD LEFT FOREARM Performed at [place] [address], [place], [place] [address]    Special Requests   Final    BOTTLES DRAWN AEROBIC AND ANAEROBIC Blood Culture adequate volume Performed at [facility] [facility] [address], [place], [place] [address]    Culture   Final    NO GROWTH 5 DAYS Performed at [facility], [place] [address], [place], [place] [address]    Report Status 10/19/2021 FINAL  Final  Blood culture (routine x 2)     Status: None   Collection Time: 10/14/21  5:53 PM    Specimen: BLOOD  Result Value Ref Range Status   Specimen Description BLOOD RIGHT FOREARM  Final   Special Requests   Final    BOTTLES DRAWN AEROBIC AND ANAEROBIC Blood Culture results may not be optimal due to an excessive volume of blood received in culture bottles   Culture   Final    NO GROWTH 5 DAYS Performed at [facility], [place] [address] [place], [place] [address]    Report Status 10/20/2021 FINAL  Final  Urine Culture     Status: None   Collection Time: 10/16/21  1:47 PM   Specimen: Urine, Clean Catch  Result Value Ref Range Status   Specimen Description   Final    URINE, CLEAN CATCH Performed at [facility], [place] [address], [place], [place] [address]    Special Requests   Final    NONE Performed at [facility], [place] [address], [place], [place] [address]    Culture   Final    NO GROWTH Performed at [facility], [place] [address], [place], [place] [address]    Report Status 10/17/2021 FINAL  Final     Radiology Studies: No results found.   Scheduled Meds:  aspirin EC  81 mg Oral Daily   atorvastatin  10 mg Oral Daily   carbidopa-levodopa  1 tablet Oral QHS   Carbidopa-Levodopa ER  1.5 tablet Oral QID   Chlorhexidine Gluconate Cloth  6 each Topical Daily   cholecalciferol  1,000 Units Oral Daily   enoxaparin (LOVENOX) injection  40 mg Subcutaneous Q24H   feeding supplement (NEPRO CARB STEADY)  237 mL Oral BID BM   ferrous sulfate  325 mg Oral Q breakfast   Gerhardt's butt cream   Topical QID   multivitamin with minerals  1 tablet Oral Daily   mouth rinse  15 mL Mouth Rinse 4 times per day   polyethylene glycol  17 g Oral BID   QUEtiapine  12.5 mg Oral Daily   senna-docusate  1 tablet Oral BID   tamsulosin  0.4 mg Oral QPC supper   Continuous Infusions:  ampicillin-sulbactam (UNASYN) IV 3 g (10/21/21 1432)   sodium chloride 0.45 % 1,000 mL with potassium chloride 40 mEq infusion 75 mL/hr at 10/20/21 1800      LOS: 7 days    Kathie Dike, MD    To contact the attending provider between 7A-7P or the covering provider during after hours 7P-7A, please log into the web site [email or web address] and access using universal [place] password for that web site. If you do not have the password, please call the  hospital operator.    10/21/2021, 5:39 PM

## 2021-10-21 NOTE — Progress Notes (Signed)
[id number]) Hospital Hospice Liaison Note   Met with patient and spouse, [name]. She plans on having discussion with her children once they are discharged about hospice support.    [facility] will continue to follow for discharge disposition.   Thank you,   Bea Laura MSN, RN [facility] Liaison [phone]

## 2021-10-21 NOTE — Progress Notes (Signed)
Initial Nutrition Assessment  DOCUMENTATION CODES:   Underweight  INTERVENTION:  Continue current diet as ordered per SLP Magic cup TID with meals, each supplement provides 290 kcal and 9 grams of protein MVI with minerals daily Nepro Shake po BID, each supplement provides 425 kcal and 19 grams protein  NUTRITION DIAGNOSIS:   Inadequate oral intake related to dysphagia as evidenced by percent weight loss (BMI of 16.9 and weight loss of 22% x 4 months).  GOAL:   Patient will meet greater than or equal to 90% of their needs  MONITOR:   PO intake, Weight trends, Supplement acceptance, Labs, Skin  REASON FOR ASSESSMENT:   Consult Assessment of nutrition requirement/status  ASSESSMENT:   Pt with hx of Parkinson's, PAD, CAD, HTN, HLD, DM type 2, and dementia presented to ED with abdominal distention. At baseline, pt is bedbound and mostly non-verbal.  Unable to reach pt at this time.   Reviewed chart and work-up in ED showed large stool burden on imaging and pt's chest XR consistent with pneumonia.   SLP evaluated and pt underwent MBS 7/17 which showed silent aspiration on thin liquids. Placed on modified diet.  If accurate, weight hx shows a 22% weight loss over the last 4 months which is quite severe. (3/30-7/17). Suspect malnutrition is present, will follow-up with physical exam.  Pt also noted to have discoloration to the sacrum which wound care expects to evolve into a full thickness wound.  Palliative care consulted and family met with hospice about services they could provide at home. At this time, pt remains full code.  Pt appears to have a good appetite this admission. Will add nectar thick supplements to augment intake in the setting of his underweight BMI and evolving wound.  Average Meal Intake: 7/17-7/23: 88% intake x 8 recorded meals  Nutritionally Relevant Medications: Scheduled Meds:  atorvastatin  10 mg Oral Daily   cholecalciferol  1,000 Units Oral  Daily   ferrous sulfate  325 mg Oral Q breakfast   polyethylene glycol  17 g Oral BID   senna-docusate  1 tablet Oral BID   Continuous Infusions:  ampicillin-sulbactam (UNASYN) IV 3 g (10/21/21 0826)   sodium chloride 0.45 % 1,000 mL with potassium chloride 40 mEq infusion 75 mL/hr at 10/20/21 1800   Labs Reviewed: BUN 6, creatinine 0.48 CBG ranges from 94-111 mg/dL over the last 24 hours  NUTRITION - FOCUSED PHYSICAL EXAM: Defer to in person assessment  Diet Order:   Diet Order             DIET - DYS 1 Room service appropriate? Yes; Fluid consistency: Nectar Thick  Diet effective now                   EDUCATION NEEDS:   No education needs have been identified at this time  Skin:  Skin Assessment: Reviewed RN Assessment (Per WOC:  11cm x 7cm with central area of deeper hued tissue measuring 4cm x 3cm with epidermal sloughing. Note: this areas is expected to decline often resulting in a full thickness ulceration despite aggressive measures)  Last BM:  7/22, type 5  Height:   Ht Readings from Last 1 Encounters:  10/15/21 6' 1" (1.854 m)    Weight:   Wt Readings from Last 1 Encounters:  10/15/21 58.3 kg    Ideal Body Weight:  83.6 kg  BMI:  Body mass index is 16.96 kg/m.  Estimated Nutritional Needs:   Kcal:  1800-2000 kcal/d  Protein:  100-115 g/d  Fluid:  >/= 2L/d    , RD, LDN Clinical Dietitian RD pager # available in AMION  After hours/weekend pager # available in AMION 

## 2021-10-21 NOTE — TOC Progression Note (Signed)
Transition of Care [facility]) - Progression Note    Patient Details  Name: Rodney Love MRN: [id number] Date of Birth: Jun 09, 1945  Transition of Care [facility]) CM/SW Contact  Ross Ludwig, [place] Phone Number: 10/21/2021, 2:38 PM  Clinical Narrative:     CSW informed by attending physician that family wants home with home health currently and not home with hospice.  Patient was set up with [facility] for [facility], and palliative through [place].  Per patient's family  patient will need EMS transport back home.  Expected Discharge Plan: [place] Barriers to Discharge: Continued Medical Work up  Expected Discharge Plan and Services Expected Discharge Plan: [place] In-house Referral: Clinical Social Work, Hospice / Palliative Care Discharge Planning Services: CM Consult Post Acute Care Choice: Home Health Living arrangements for the past 2 months: Single Family Home                 DME Arranged: N/A DME Agency: NA                   Social Determinants of Health (SDOH) Interventions    Readmission Risk Interventions    10/16/2021    3:04 PM 05/01/2020   12:29 PM  Readmission Risk Prevention Plan  Post Dischage Appt Complete   Medication Screening Complete   Transportation Screening Complete Complete  PCP or Specialist Appt within 5-7 Days  Complete  Home Care Screening  Complete  Medication Review (RN CM)  Complete

## 2021-10-22 DIAGNOSIS — J189 Pneumonia, unspecified organism: Secondary | ICD-10-CM | POA: Diagnosis not present

## 2021-10-22 DIAGNOSIS — E119 Type 2 diabetes mellitus without complications: Secondary | ICD-10-CM | POA: Diagnosis not present

## 2021-10-22 DIAGNOSIS — I10 Essential (primary) hypertension: Secondary | ICD-10-CM

## 2021-10-22 DIAGNOSIS — E039 Hypothyroidism, unspecified: Secondary | ICD-10-CM | POA: Diagnosis not present

## 2021-10-22 DIAGNOSIS — N179 Acute kidney failure, unspecified: Secondary | ICD-10-CM | POA: Diagnosis not present

## 2021-10-22 MED ORDER — TAMSULOSIN HCL 0.4 MG PO CAPS: 0.4000 mg | ORAL_CAPSULE | Freq: Every day | ORAL | 1 refills | Status: DC

## 2021-10-22 MED ORDER — QUETIAPINE FUMARATE 25 MG PO TABS: 12.5000 mg | ORAL_TABLET | Freq: Every day | ORAL | 1 refills | Status: DC

## 2021-10-22 MED ORDER — FOOD THICKENER (SIMPLYTHICK): 1.0000 | ORAL | 1 refills | Status: DC | PRN

## 2021-10-22 MED ORDER — POLYETHYLENE GLYCOL 3350 17 G PO PACK: 17.0000 g | PACK | Freq: Two times a day (BID) | ORAL | 0 refills | Status: DC

## 2021-10-22 MED ORDER — TIZANIDINE HCL 4 MG PO TABS: 4.0000 mg | ORAL_TABLET | Freq: Four times a day (QID) | ORAL | 0 refills | Status: DC | PRN

## 2021-10-22 NOTE — Progress Notes (Signed)
[facility])  Outpatient palliative services referral received.  [facility] will f/u with patient and family in the home to begin services.  Venia Carbon DNP, RN [facility] Liaison  (Epic chat or [phone])

## 2021-10-22 NOTE — Discharge Summary (Signed)
Physician Discharge Summary  THEOREN PALKA GGY:[id number] DOB: 1946/02/25 DOA: 10/14/2021  PCP: Lujean Amel, MD  Admit date: 10/14/2021 Discharge date: 10/22/2021  Admitted From: home Disposition:  home  Recommendations for Outpatient Follow-up:  Follow up with PCP in 1-2 weeks Please obtain BMP/CBC in one week Follow up with primary neurologist as scheduled Patient to be discharged with foley catheter, urology office contacted to arrange follow up appointment Palliative care will follow as outpatient  Home [place] RN, PT, OT, aide, SLP, Social work Equipment/Devices:foley catheter  Discharge Condition:stable CODE STATUS:full code Diet recommendation: dysphagia 1 diet with nectar thick liquids  Brief/Interim Summary: 76/M with history of Parkinson's disease, dementia, severe malnutrition, sacral decubitus wounds, type 2 diabetes mellitus, PAD, hypothyroidism was brought to the ED by his wife who is his primary caretaker, she noted mild lower abdominal distention with worsening of his chronic cough. -In the ED, had leukocytosis of 16.4, mild AKI, abnormal UA and urinary retention, had to have Foley catheter.  UA was abnormal, chest x-ray was concerning for free intraperitoneal  air and streaky right basilar opacities, CT abdomen pelvis noted large amount of stool distending abdomen, possible fecal impaction and distended bladder along with right lower lobe infiltrate versus atelectasis.  Treated for UTI, acute urinary retention, aspiration pneumonia and AKI.   Discharge Diagnoses:  Principal Problem:   AKI (acute kidney injury) ([place]) Active Problems:   PAD (peripheral artery disease) s/p remote left external iliac artery angioplasty   Essential hypertension   Diabetes mellitus type 2 in nonobese (HCC)   PD (Parkinson's disease) ([place])   Ileus ([place])   Community acquired pneumonia   Acute urinary retention   Acquired hypothyroidism   Decubitus ulcer of sacral area    Protein-calorie malnutrition, severe ([place])   Failure to thrive in adult   Hypernatremia  UTI Acute urinary retention -Initially got a dose of IV ceftriaxone and azithromycin which was then switched to IV Unasyn to cover aspiration pneumonia and possible UTI with retention -Urine culture with multiple species, repeat urine culture shows no growth.  UTI treated. -Due to difficulties with transportation, poor mobility family discussed with prior MDs and wanted to discontinue Foley catheter.  As per RN report, failed voiding trial and Foley catheter was placed back. -Started on Flomax, continue -Recommend outpatient follow-up with urology in 1 to 2 weeks for voiding trial. -Initiated Flomax 0.4 mg daily.  Mildly enlarged prostate on recent CT abdomen 7/16.   * AKI (acute kidney injury) ([place]) -Resolved with hydration   Aspiration pneumonia Chest x-ray showed right basilar opacity concerning for possible infiltrate.  Wife also mentions increased cough in the past 2 days.  -He is high aspiration risk with advanced Parkinson's and severe malnutrition -ABX changed to Unasyn, course completed in the hospital -SLP evaluation noted moderate oropharyngeal dysphagia with silent aspiration of thin barium, recommended dysphagia 1 diet with nectar thick liquids -Blood cultures x2: Negative to date.  No fevers, leukocytosis or hypoxia. He completed his antibiotic course in the hospital.  Remains at high risk for recurrent aspiration going forward.       Ileus (HCC)/fecal impaction Likely due to his Parkinson's disease.  Also has large stool burden seen on CT abdomen/pelvis.   -Continue daily MiraLAX, senna - management as noted above. -KUB shows large stool burden in the rectum.  Enema ordered. -Appears to be having more regular bowel movements now   PD (Parkinson's disease) ([place]) -Continue Sinemet -started on tizanidine for muscle cramps   Sacral  decubitus wound, POA -WOC RN input appreciated.    Diabetes mellitus type 2 in nonobese (HCC) A.m. blood sugars on BMPs unremarkable.  A1c 5.5 in January.   Hypokalemia -Replaced   Anemia: -stable   Hypernatremia -IVF -encourage PO intake -Improved   Goals of care -Palliative care following, appreciate assistance -Placement to skilled facility offered, but family electing to take patient home. -At this point, he is a full code -Palliative care/hospice will continue to work with family regarding discussions around patient's wishes at end-of-life and goals of care -At this point, it appears that they wish to continue with therapies including speech therapy and physical therapy.  Palliative care will follow and help transition to hospice when family is ready.  Discharge Instructions  Discharge Instructions     Diet - low sodium heart healthy   Complete by: As directed    Discharge wound care:   Complete by: As directed    Cleanse with soap and water, rinse and apt dry. Cover with folded layer of xeroform gauze, top with dry gauze and cover with silicone foam bordered dressing with the tip placed pointing away from the anus.   Increase activity slowly   Complete by: As directed       Allergies as of 10/22/2021   No Known Allergies      Medication List     TAKE these medications    acetaminophen 500 MG tablet Commonly known as: TYLENOL Take 500 mg by mouth every 6 (six) hours as needed for moderate pain.   aspirin EC 81 MG tablet Take 81 mg by mouth daily.   atorvastatin 10 MG tablet Commonly known as: LIPITOR TAKE 1 TABLET(10 MG) BY MOUTH DAILY What changed: See the new instructions.   carbidopa-levodopa 25-100 MG tablet Commonly known as: SINEMET IR Take 1 tablet by mouth See admin instructions. Take 1 tablet by mouth at 7 AM, 10 AM, 1 PM, and 4 PM What changed: Another medication with the same name was removed. Continue taking this medication, and follow the directions you see here.   carbidopa-levodopa  50-200 MG tablet Commonly known as: SINEMET CR TAKE 1 TABLET BY MOUTH EVERY NIGHT AT BEDTIME What changed: Another medication with the same name was removed. Continue taking this medication, and follow the directions you see here.   cholecalciferol 25 MCG (1000 UNIT) tablet Commonly known as: VITAMIN D3 Take 1,000 Units by mouth daily.   ferrous sulfate 324 MG Tbec Take 324 mg by mouth daily with breakfast.   food thickener Gel Commonly known as: SIMPLYTHICK (NECTAR/LEVEL 2/MILDLY THICK) Take 1 packet by mouth as needed.   guaiFENesin 600 MG 12 hr tablet Commonly known as: MUCINEX Take 600 mg by mouth every 12 (twelve) hours as needed for to loosen phlegm or cough.   OneTouch Ultra test strip Generic drug: glucose blood   polyethylene glycol 17 g packet Commonly known as: MIRALAX / GLYCOLAX Take 17 g by mouth 2 (two) times daily.   Potassium 99 MG Tabs Take 99 mg by mouth daily.   QUEtiapine 25 MG tablet Commonly known as: SEROQUEL Take 0.5 tablets (12.5 mg total) by mouth at bedtime.   senna 8.6 MG Tabs tablet Commonly known as: SENOKOT Take 1 tablet (8.6 mg total) by mouth 2 (two) times daily. What changed:  when to take this reasons to take this   tamsulosin 0.4 MG Caps capsule Commonly known as: FLOMAX Take 1 capsule (0.4 mg total) by mouth daily after supper.  tiZANidine 4 MG tablet Commonly known as: ZANAFLEX Take 1 tablet (4 mg total) by mouth every 6 (six) hours as needed for muscle spasms.   VITAMIN B 12 PO Take 1 tablet by mouth daily.               Discharge Care Instructions  (From admission, onward)           Start     Ordered   10/22/21 0000  Discharge wound care:       Comments: Cleanse with soap and water, rinse and apt dry. Cover with folded layer of xeroform gauze, top with dry gauze and cover with silicone foam bordered dressing with the tip placed pointing away from the anus.   10/22/21 1234            No Known  Allergies  Consultations: Palliative care   Procedures/Studies: DG Abd Portable 1V  Result Date: 10/17/2021 CLINICAL DATA:  Fecal impaction EXAM: PORTABLE ABDOMEN - 1 VIEW COMPARISON:  CT abdomen/pelvis dated 10/15/2018 FINDINGS: There is gaseous distention of the small bowel without evidence of mechanical obstruction. Enteric contrast is seen throughout the colon. There is a large stool burden projecting over the rectum. There is no definite free intraperitoneal air, within the confines of supine technique. There is scoliotic curvature and multilevel degenerative change of the lumbar spine. Right hip arthroplasty hardware is noted. The lung bases are clear. IMPRESSION: 1. Gaseous distention of the small bowel without evidence of mechanical obstruction. Enteric contrast seen throughout the colon. 2. Large stool burden in the rectum. Electronically Signed   By: Valetta Mole M.D.   On: 10/17/2021 16:03   DG Swallowing Func-Speech Pathology  Result Date: 10/15/2021 Table formatting from the original result was not included. Objective Swallowing Evaluation: Type of Study: MBS-Modified Barium Swallow Study  Patient Details Name: JUSTINO BOZE MRN: [id number] Date of Birth: [birth year] Today's Date: 10/15/2021 Time: SLP Start Time (ACUTE ONLY): 20 -SLP Stop Time (ACUTE ONLY): 6546 SLP Time Calculation (min) (ACUTE ONLY): 16 min Past Medical History: Past Medical History: Diagnosis Date  Arthritis   Constipation   OCCASIONAL  Coronary artery disease   Diabetes mellitus without complication ([place])   Environmental allergies   Hyperlipidemia   Hypertension   Inguinal hernia   Iron deficiency   Nocturia   Parkinson disease ([place])   Peripheral arterial disease ([place])  Past Surgical History: Past Surgical History: Procedure Laterality Date  BACK SURGERY    CARDIAC CATHETERIZATION  05/29/2006  single vessel CAD w/moderate stenosis of the prox. RCA approx 40%,prox. LAD 20-30%,minor irreg. mid abdominal aorta,left iliac  artery disease 50-60%  CATARACT EXTRACTION Left 12/2016  HERNIA REPAIR  [id number]  INGUINAL HERNIA REPAIR N/A 09/23/2012  Procedure: LAPAROSCOPIC INGUINAL HERNIA with umbilical hernia;  Surgeon: Madilyn Hook, DO;  Location: [facility];  Service: General;  Laterality: N/A;  INSERTION OF MESH Bilateral 09/23/2012  Procedure: INSERTION OF MESH;  Surgeon: Madilyn Hook, DO;  Location: [facility];  Service: General;  Laterality: Bilateral;  TOTAL HIP ARTHROPLASTY Right 04/29/2020  Procedure: TOTAL HIP ARTHROPLASTY ANTERIOR APPROACH;  Surgeon: Rod Can, MD;  Location: [facility];  Service: Orthopedics;  Laterality: Right; HPI: SAN LOHMEYER is a 76 y.o. male with medical history significant of Parkinson's, type 2 diabetes, PAD, hypothyroidism and hyperlipidemia who presents with abdominal distention, bedbound and mostly non-verbal due to Parkinson's. per chart. Pt has chronic cough but has noticed that it has been worse in the past 2 days and productive. Found  to have AKI, urinary retention, pneumonia and suspected ileus with full liquid diet (MD stated does not suspect he has an ileus).  No data recorded  Recommendations for follow up therapy are one component of a multi-disciplinary discharge planning process, led by the attending physician.  Recommendations may be updated based on patient status, additional functional criteria and insurance authorization. Assessment / Plan / Recommendation   10/15/2021   2:55 PM Clinical Impressions Clinical Impression Pt presents with mild-moderate oropharyngeal dysphagia with silent aspiration of thin barium. Upper denture in place and lower plate in pt's room. There was intermittent prolonged propulsion with most consistencies with lingual residue at times. Pharyngeal phase marked by reduced tongue base retraction, decreased pharyngeal contraction and late laryngeal closure resulting in mild-max vallecular residue across textures. Thin barium entered vestibule prior to laryngeal closure and  fell into trachea with only slight delayed and ineffective throat clear. There was one instance of minimal penetration with nectar and given residual volume, honey thick not given. A chin tuck to decrease vallecular residue was effective. Pt is at higher opportunity for dysphagia given Parkinson's and is exacerbated with current illness. Results discussed with pt, wife and daughter who voiced understanding of recommendation of nectar thick liquids and puree texture given reduced endurance. SLP will follow and observe pt with higher food texture once lower denture donned once improvements seen in overall endurance. SLP Visit Diagnosis Dysphagia, oropharyngeal phase (R13.12) Impact on safety and function Moderate aspiration risk     10/15/2021   2:55 PM Treatment Recommendations Treatment Recommendations Therapy as outlined in treatment plan below     10/15/2021   2:55 PM Prognosis Prognosis for Safe Diet Advancement Good Barriers to Reach Goals --   10/15/2021   2:55 PM Diet Recommendations SLP Diet Recommendations Dysphagia 1 (Puree) solids;Nectar thick liquid Liquid Administration via Straw;Cup Medication Administration Crushed with puree Compensations Slow rate;Small sips/bites;Clear throat intermittently;Multiple dry swallows after each bite/sip Postural Changes Seated upright at 90 degrees     10/15/2021   2:55 PM Other Recommendations Oral Care Recommendations Oral care BID Follow Up Recommendations -- Assistance recommended at discharge Frequent or constant Supervision/Assistance Functional Status Assessment Patient has had a recent decline in their functional status and demonstrates the ability to make significant improvements in function in a reasonable and predictable amount of time.   10/15/2021   2:55 PM Frequency and Duration  Speech Therapy Frequency (ACUTE ONLY) min 2x/week Treatment Duration 2 weeks     10/15/2021   2:55 PM Oral Phase Oral Phase Impaired Oral - Nectar Cup Lingual/palatal residue;Delayed  oral transit Oral - Nectar Straw Delayed oral transit;Lingual/palatal residue Oral - Thin Cup Delayed oral transit Oral - Puree Lingual/palatal residue;Delayed oral transit Oral - Regular Delayed oral transit;Reduced posterior propulsion    10/15/2021   2:55 PM Pharyngeal Phase Pharyngeal Phase Impaired Pharyngeal- Nectar Cup Delayed swallow initiation-vallecula;Penetration/Aspiration during swallow;Pharyngeal residue - valleculae;Pharyngeal residue - pyriform;Reduced tongue base retraction;Reduced pharyngeal peristalsis Pharyngeal Material enters airway, remains ABOVE vocal cords and not ejected out;Material does not enter airway Pharyngeal- Nectar Straw Pharyngeal residue - valleculae;Pharyngeal residue - pyriform;Reduced pharyngeal peristalsis;Reduced tongue base retraction Pharyngeal- Thin Cup Penetration/Aspiration during swallow;Reduced tongue base retraction;Reduced pharyngeal peristalsis;Pharyngeal residue - valleculae;Pharyngeal residue - pyriform Pharyngeal Material enters airway, passes BELOW cords without attempt by patient to eject out (silent aspiration) Pharyngeal- Puree Delayed swallow initiation-vallecula;Pharyngeal residue - valleculae;Pharyngeal residue - pyriform;Reduced tongue base retraction;Reduced pharyngeal peristalsis Pharyngeal- Regular Pharyngeal residue - valleculae;Reduced laryngeal elevation    10/15/2021  2:55 PM Cervical Esophageal Phase  Cervical Esophageal Phase [facility] [name] 10/15/2021, 3:15 PM                     CT ABDOMEN PELVIS W CONTRAST  Result Date: 10/14/2021 CLINICAL DATA:  Acute generalized abdominal pain. EXAM: CT ABDOMEN AND PELVIS WITH CONTRAST TECHNIQUE: Multidetector CT imaging of the abdomen and pelvis was performed using the standard protocol following bolus administration of intravenous contrast. RADIATION DOSE REDUCTION: This exam was performed according to the departmental dose-optimization program which includes automated exposure control,  adjustment of the mA and/or kV according to patient size and/or use of iterative reconstruction technique. CONTRAST:  145m OMNIPAQUE IOHEXOL 300 MG/ML  SOLN COMPARISON:  None Available. FINDINGS: Lower Chest: Infiltrate or atelectasis is seen in the posterior right lower lobe. Hepatobiliary: No hepatic masses identified. Gallbladder is unremarkable. No evidence of biliary ductal dilatation. Pancreas: No mass or inflammatory changes. Diffuse fatty replacement of pancreas noted. Spleen: Within normal limits in size and appearance. Adrenals/Urinary Tract: Severe renal vascular calcification noted bilaterally. 2 mm calculus noted in midpole of left kidney. No masses identified. No evidence of ureteral calculi or hydronephrosis. The urinary bladder is dilated and numerous tiny calculi are seen along the dependent wall of the urinary bladder. Stomach/Bowel: Diffuse dilatation of small and large bowel is seen, which may be due to ileus or fecal impaction. Large amount of stool is seen distending the rectum. No evidence of bowel obstruction, inflammatory process or abnormal fluid collections. Vascular/Lymphatic: No pathologically enlarged lymph nodes. No acute vascular findings. Aortic atherosclerotic calcification incidentally noted. Reproductive: Visualization due to severe artifact from right hip prosthesis. Mildly enlarged prostate gland with mass effect on bladder base. Other:  None. Musculoskeletal:  No suspicious bone lesions identified. IMPRESSION: Large amount of stool distending the rectum, suspicious for fecal impaction. Diffuse dilatation of small and large bowel, which may be due to ileus or fecal impaction. Mildly enlarged prostate. Distended urinary bladder likely due to chronic bladder outlet obstruction. Numerous tiny bladder calculi. Tiny left renal calculus. No evidence of ureteral calculi or hydronephrosis. Infiltrate or atelectasis in posterior right lower lobe. Pneumonia cannot be excluded. Aortic  Atherosclerosis (ICD10-I70.0). Electronically Signed   By: JMarlaine HindM.D.   On: 10/14/2021 16:40   DG Chest Portable 1 View  Result Date: 10/14/2021 CLINICAL DATA:  Sepsis EXAM: PORTABLE CHEST 1 VIEW COMPARISON:  04/28/2020 FINDINGS: Loop recorder device overlies the left chest. The heart size and mediastinal contours are within normal limits. Streaky right basilar interstitial opacity. Appearance suggestive of free intraperitoneal air with increased lucency under the right hemidiaphragm and centrally. The visualized skeletal structures are unremarkable. IMPRESSION: 1. Appearance suggestive of free intraperitoneal air. Recommend CT of the abdomen and pelvis for further assessment. 2. Streaky right basilar interstitial opacity, which may represent atelectasis versus infiltrate. These results were called by telephone at the time of interpretation on 10/14/2021 at 3:21 pm to [facility], who verbally acknowledged these results. Electronically Signed   By: NDavina PokeD.O.   On: 10/14/2021 15:23   CUP PACEART REMOTE DEVICE CHECK  Result Date: 10/08/2021 ILR summary report received. Battery status OK. Normal device function. No new symptom, tachy, brady, or pause episodes. 9 false AF episodes, oversensing of artifact.  Hx of Parkinsons, burden 3%.   Monthly summary reports and ROV/PRN [place]     Subjective: He feels ok. Wife says that patient slept well overnight  Discharge Exam: Vitals:  10/21/21 0529 10/21/21 1347 10/21/21 2054 10/22/21 0522  BP: 133/74 110/68 (!) 155/98 (!) 159/99  Pulse: (!) 56 79 76 68  Resp: 16 18 (!) 21 18  Temp: 97.6 F (36.4 C) 98 F (36.7 C) (!) 97.5 F (36.4 C) 97.7 F (36.5 C)  TempSrc: Oral Oral Oral Oral  SpO2: 93% 99% (!) 78% 91%  Weight:      Height:        General: Pt is alert, awake, not in acute distress Cardiovascular: RRR, S1/S2 +, no rubs, no gallops Respiratory: CTA bilaterally, no wheezing, no rhonchi Abdominal: Soft, NT, ND,  bowel sounds + Extremities: no edema, no cyanosis    The results of significant diagnostics from this hospitalization (including imaging, microbiology, ancillary and laboratory) are listed below for reference.     Microbiology: Recent Results (from the past 240 hour(s))  Urine Culture     Status: Abnormal   Collection Time: 10/14/21  1:43 PM   Specimen: Urine, Clean Catch  Result Value Ref Range Status   Specimen Description   Final    URINE, CLEAN CATCH Performed at [facility], [place] [address], [place], [place] [address]    Special Requests   Final    NONE Performed at [facility], [place] [address], [place], [place] [address]    Culture MULTIPLE SPECIES PRESENT, SUGGEST RECOLLECTION (A)  Final   Report Status 10/15/2021 FINAL  Final  Blood culture (routine x 2)     Status: None   Collection Time: 10/14/21  1:45 PM   Specimen: BLOOD  Result Value Ref Range Status   Specimen Description   Final    BLOOD BLOOD LEFT FOREARM Performed at [place] [address] [place], [place] [address]    Special Requests   Final    BOTTLES DRAWN AEROBIC AND ANAEROBIC Blood Culture adequate volume Performed at [place] [address] [place], [place] [address]    Culture   Final    NO GROWTH 5 DAYS Performed at [facility], [place] [address], [place], [place] [address]    Report Status 10/19/2021 FINAL  Final  Blood culture (routine x 2)     Status: None   Collection Time: 10/14/21  5:53 PM   Specimen: BLOOD  Result Value Ref Range Status   Specimen Description BLOOD RIGHT FOREARM  Final   Special Requests   Final    BOTTLES DRAWN AEROBIC AND ANAEROBIC Blood Culture results may not be optimal due to an excessive volume of blood received in culture bottles   Culture   Final    NO GROWTH 5 DAYS Performed at [facility], [place] [address] [place], [place] [address]    Report Status  10/20/2021 FINAL  Final  Urine Culture     Status: None   Collection Time: 10/16/21  1:47 PM   Specimen: Urine, Clean Catch  Result Value Ref Range Status   Specimen Description   Final    URINE, CLEAN CATCH Performed at [facility], [place] [address] [place], [place] [address]    Special Requests   Final    NONE Performed at [facility], [place] [address] [place], [place] [address]    Culture   Final    NO GROWTH Performed at [facility], [place] [address] [place], [place] [address]    Report Status 10/17/2021 FINAL  Final  Labs: BNP (last 3 results) No results for input(s): "BNP" in the last 8760 hours. Basic Metabolic Panel: Recent Labs  Lab 10/17/21 0630 10/18/21 0547 10/19/21 0504 10/20/21 0710 10/21/21 0611  NA 138 148* 142 140 144  K 3.1* 3.8 3.6 3.7 3.8  CL 108 114* 112* 111 113*  CO2 '23 28 24 23 26  '$ GLUCOSE 128* 105* 111* 94 100*  BUN '16 11 8 '$ 6* 6*  CREATININE 0.40* 0.41* 0.43* 0.50* 0.48*  CALCIUM 8.5* 8.9 8.3* 8.5* 8.9  MG  --  1.8  --  2.0  --    Liver Function Tests: No results for input(s): "AST", "ALT", "ALKPHOS", "BILITOT", "PROT", "ALBUMIN" in the last 168 hours. No results for input(s): "LIPASE", "AMYLASE" in the last 168 hours. No results for input(s): "AMMONIA" in the last 168 hours. CBC: Recent Labs  Lab 10/17/21 0630 10/18/21 0547 10/21/21 0611  WBC 5.2 5.6 5.9  HGB 11.8* 12.7* 12.5*  HCT 36.4* 39.5 38.6*  MCV 98.4 97.8 97.5  PLT 153 176 222   Cardiac Enzymes: No results for input(s): "CKTOTAL", "CKMB", "CKMBINDEX", "TROPONINI" in the last 168 hours. BNP: Invalid input(s): "POCBNP" CBG: No results for input(s): "GLUCAP" in the last 168 hours. D-Dimer No results for input(s): "DDIMER" in the last 72 hours. Hgb A1c No results for input(s): "HGBA1C" in the last 72 hours. Lipid Profile No results for input(s): "CHOL", "HDL", "LDLCALC", "TRIG", "CHOLHDL", "LDLDIRECT" in the last 72  hours. Thyroid function studies No results for input(s): "TSH", "T4TOTAL", "T3FREE", "THYROIDAB" in the last 72 hours.  Invalid input(s): "FREET3" Anemia work up No results for input(s): "VITAMINB12", "FOLATE", "FERRITIN", "TIBC", "IRON", "RETICCTPCT" in the last 72 hours. Urinalysis    Component Value Date/Time   COLORURINE YELLOW 10/14/2021 1343   APPEARANCEUR HAZY (A) 10/14/2021 1343   LABSPEC 1.008 10/14/2021 1343   PHURINE 6.0 10/14/2021 1343   GLUCOSEU NEGATIVE 10/14/2021 1343   HGBUR MODERATE (A) 10/14/2021 1343   BILIRUBINUR NEGATIVE 10/14/2021 1343   KETONESUR NEGATIVE 10/14/2021 1343   PROTEINUR NEGATIVE 10/14/2021 1343   NITRITE NEGATIVE 10/14/2021 1343   LEUKOCYTESUR LARGE (A) 10/14/2021 1343   Sepsis Labs Recent Labs  Lab 10/17/21 0630 10/18/21 0547 10/21/21 0611  WBC 5.2 5.6 5.9   Microbiology Recent Results (from the past 240 hour(s))  Urine Culture     Status: Abnormal   Collection Time: 10/14/21  1:43 PM   Specimen: Urine, Clean Catch  Result Value Ref Range Status   Specimen Description   Final    URINE, CLEAN CATCH Performed at [facility], [place] [address], [place], [place] [address]    Special Requests   Final    NONE Performed at [facility], [place] [address], [place], [place] [address]    Culture MULTIPLE SPECIES PRESENT, SUGGEST RECOLLECTION (A)  Final   Report Status 10/15/2021 FINAL  Final  Blood culture (routine x 2)     Status: None   Collection Time: 10/14/21  1:45 PM   Specimen: BLOOD  Result Value Ref Range Status   Specimen Description   Final    BLOOD BLOOD LEFT FOREARM Performed at [place] [address] [place], [place] [address]    Special Requests   Final    BOTTLES DRAWN AEROBIC AND ANAEROBIC Blood Culture adequate volume Performed at [place] [address] [place], [place] [address]    Culture   Final    NO GROWTH 5 DAYS Performed  at [facility], [place] [address] [place], [place] [address]    Report Status 10/19/2021 FINAL  Final  Blood culture (routine x 2)     Status: None   Collection Time: 10/14/21  5:53 PM   Specimen: BLOOD  Result Value Ref Range Status   Specimen Description BLOOD RIGHT FOREARM  Final   Special Requests   Final    BOTTLES DRAWN AEROBIC AND ANAEROBIC Blood Culture results may not be optimal due to an excessive volume of blood received in culture bottles   Culture   Final    NO GROWTH 5 DAYS Performed at [facility], [place] [address] [place], [place] [address]    Report Status 10/20/2021 FINAL  Final  Urine Culture     Status: None   Collection Time: 10/16/21  1:47 PM   Specimen: Urine, Clean Catch  Result Value Ref Range Status   Specimen Description   Final    URINE, CLEAN CATCH Performed at [facility], [place] [address] [place], [place] [address]    Special Requests   Final    NONE Performed at [facility], [place] [address] [place], [place] [address]    Culture   Final    NO GROWTH Performed at [facility], [place] [address] [place], [place] [address]    Report Status 10/17/2021 FINAL  Final     Time coordinating discharge: 61mns  SIGNED:   JKathie Dike MD  [facility] 10/22/2021, 12:46 PM   If 7PM-7AM, please contact night-coverage [email or web address]

## 2021-10-22 NOTE — TOC Transition Note (Signed)
Transition of Care [facility]) - CM/SW Discharge Note   Patient Details  Name: Rodney Love MRN: [id number] Date of Birth: 11-Feb-1946  Transition of Care [facility]) CM/SW Contact:  Ross Ludwig, LCSW Phone Number: 10/22/2021, 12:56 PM   Clinical Narrative:     Patient will be going home with home health PT, OT, ST, RN, aide, and Social Work through [employer].  CSW signing off please reconsult with any other social work needs, home health agency has been notified of planned discharge.  Per patient's wife, he will need EMS transport back home.  [facility] is following for outpatient palliative.     Final next level of care: [place] Barriers to Discharge: Barriers Resolved   Patient Goals and CMS Choice Patient states their goals for this hospitalization and ongoing recovery are:: To return back home with home health. CMS Medicare.gov Compare Post Acute Care list provided to:: Patient Represenative (must comment) Choice offered to / list presented to : Spouse  Discharge Placement                Patient to be transferred to facility by: [place] Name of family member notified: Wife [name] Patient and family notified of of transfer: 10/22/21  Discharge Plan and Services In-house Referral: Clinical Social Work, Hospice / Palliative Care Discharge Planning Services: CM Consult Post Acute Care Choice: Home Health          DME Arranged: N/A DME Agency: NA       HH Arranged: RN, PT, OT, Nurse's Aide, Social Work, Theme park manager Therapy HH Agency: [facility] Date [place]: 10/22/21 Time [place]: [id number] Representative spoke with at [place]: [place] ([place]) Interventions     Readmission Risk Interventions    10/16/2021    3:04 PM 05/01/2020   12:29 PM  Readmission Risk Prevention Plan  Post Dischage Appt Complete   Medication Screening Complete   Transportation Screening Complete Complete  PCP or  Specialist Appt within 5-7 Days  Complete  Home Care Screening  Complete  Medication Review (RN CM)  Complete

## 2021-10-23 ENCOUNTER — Telehealth: Payer: Self-pay

## 2021-10-23 NOTE — Telephone Encounter (Signed)
ILR alert for AF on 10/15/21 @ 0113 with duration 13hrs with avg rate 111bpm. EGM appears to be true AF with R-R irregular. Hx of Parkinson's and previous false episodes due to artifact. Routing for further review. - JJB  Routing to Dr. Sallyanne Kuster for review

## 2021-10-23 NOTE — Telephone Encounter (Signed)
He was hospitalized, acutely ill during a period of time.  Looks like there is a possible plan for palliative care or even hospice.  We will not start anticoagulation at this time.

## 2021-10-27 DIAGNOSIS — J690 Pneumonitis due to inhalation of food and vomit: Secondary | ICD-10-CM | POA: Diagnosis not present

## 2021-10-27 DIAGNOSIS — R627 Adult failure to thrive: Secondary | ICD-10-CM | POA: Diagnosis not present

## 2021-10-27 DIAGNOSIS — E876 Hypokalemia: Secondary | ICD-10-CM | POA: Diagnosis not present

## 2021-10-27 DIAGNOSIS — D509 Iron deficiency anemia, unspecified: Secondary | ICD-10-CM | POA: Diagnosis not present

## 2021-10-27 DIAGNOSIS — Z7401 Bed confinement status: Secondary | ICD-10-CM | POA: Diagnosis not present

## 2021-10-27 DIAGNOSIS — Z9842 Cataract extraction status, left eye: Secondary | ICD-10-CM | POA: Diagnosis not present

## 2021-10-27 DIAGNOSIS — Z9181 History of falling: Secondary | ICD-10-CM | POA: Diagnosis not present

## 2021-10-27 DIAGNOSIS — M1990 Unspecified osteoarthritis, unspecified site: Secondary | ICD-10-CM | POA: Diagnosis not present

## 2021-10-27 DIAGNOSIS — Z96641 Presence of right artificial hip joint: Secondary | ICD-10-CM | POA: Diagnosis not present

## 2021-10-27 DIAGNOSIS — K5900 Constipation, unspecified: Secondary | ICD-10-CM | POA: Diagnosis not present

## 2021-10-27 DIAGNOSIS — Z7982 Long term (current) use of aspirin: Secondary | ICD-10-CM | POA: Diagnosis not present

## 2021-10-27 DIAGNOSIS — L89152 Pressure ulcer of sacral region, stage 2: Secondary | ICD-10-CM | POA: Diagnosis not present

## 2021-10-27 DIAGNOSIS — R339 Retention of urine, unspecified: Secondary | ICD-10-CM | POA: Diagnosis not present

## 2021-10-27 DIAGNOSIS — I2510 Atherosclerotic heart disease of native coronary artery without angina pectoris: Secondary | ICD-10-CM | POA: Diagnosis not present

## 2021-10-27 DIAGNOSIS — E1151 Type 2 diabetes mellitus with diabetic peripheral angiopathy without gangrene: Secondary | ICD-10-CM | POA: Diagnosis not present

## 2021-10-27 DIAGNOSIS — Z466 Encounter for fitting and adjustment of urinary device: Secondary | ICD-10-CM | POA: Diagnosis not present

## 2021-10-27 DIAGNOSIS — E039 Hypothyroidism, unspecified: Secondary | ICD-10-CM | POA: Diagnosis not present

## 2021-10-27 DIAGNOSIS — R1312 Dysphagia, oropharyngeal phase: Secondary | ICD-10-CM | POA: Diagnosis not present

## 2021-10-27 DIAGNOSIS — Z87440 Personal history of urinary (tract) infections: Secondary | ICD-10-CM | POA: Diagnosis not present

## 2021-10-27 DIAGNOSIS — I10 Essential (primary) hypertension: Secondary | ICD-10-CM | POA: Diagnosis not present

## 2021-10-27 DIAGNOSIS — E871 Hypo-osmolality and hyponatremia: Secondary | ICD-10-CM | POA: Diagnosis not present

## 2021-10-27 DIAGNOSIS — G20 Parkinson's disease: Secondary | ICD-10-CM | POA: Diagnosis not present

## 2021-10-27 DIAGNOSIS — E43 Unspecified severe protein-calorie malnutrition: Secondary | ICD-10-CM | POA: Diagnosis not present

## 2021-10-27 DIAGNOSIS — E785 Hyperlipidemia, unspecified: Secondary | ICD-10-CM | POA: Diagnosis not present

## 2021-10-29 DIAGNOSIS — E1151 Type 2 diabetes mellitus with diabetic peripheral angiopathy without gangrene: Secondary | ICD-10-CM | POA: Diagnosis not present

## 2021-10-29 DIAGNOSIS — E039 Hypothyroidism, unspecified: Secondary | ICD-10-CM | POA: Diagnosis not present

## 2021-10-29 DIAGNOSIS — L89152 Pressure ulcer of sacral region, stage 2: Secondary | ICD-10-CM | POA: Diagnosis not present

## 2021-10-29 DIAGNOSIS — Z9842 Cataract extraction status, left eye: Secondary | ICD-10-CM | POA: Diagnosis not present

## 2021-10-29 DIAGNOSIS — G20 Parkinson's disease: Secondary | ICD-10-CM | POA: Diagnosis not present

## 2021-10-29 DIAGNOSIS — E43 Unspecified severe protein-calorie malnutrition: Secondary | ICD-10-CM | POA: Diagnosis not present

## 2021-10-29 DIAGNOSIS — I10 Essential (primary) hypertension: Secondary | ICD-10-CM | POA: Diagnosis not present

## 2021-10-29 DIAGNOSIS — D509 Iron deficiency anemia, unspecified: Secondary | ICD-10-CM | POA: Diagnosis not present

## 2021-10-29 DIAGNOSIS — M1990 Unspecified osteoarthritis, unspecified site: Secondary | ICD-10-CM | POA: Diagnosis not present

## 2021-10-29 DIAGNOSIS — E876 Hypokalemia: Secondary | ICD-10-CM | POA: Diagnosis not present

## 2021-10-29 DIAGNOSIS — E871 Hypo-osmolality and hyponatremia: Secondary | ICD-10-CM | POA: Diagnosis not present

## 2021-10-29 DIAGNOSIS — R627 Adult failure to thrive: Secondary | ICD-10-CM | POA: Diagnosis not present

## 2021-10-29 DIAGNOSIS — R1312 Dysphagia, oropharyngeal phase: Secondary | ICD-10-CM | POA: Diagnosis not present

## 2021-10-29 DIAGNOSIS — Z96641 Presence of right artificial hip joint: Secondary | ICD-10-CM | POA: Diagnosis not present

## 2021-10-29 DIAGNOSIS — Z7401 Bed confinement status: Secondary | ICD-10-CM | POA: Diagnosis not present

## 2021-10-29 DIAGNOSIS — K5900 Constipation, unspecified: Secondary | ICD-10-CM | POA: Diagnosis not present

## 2021-10-29 DIAGNOSIS — R339 Retention of urine, unspecified: Secondary | ICD-10-CM | POA: Diagnosis not present

## 2021-10-29 DIAGNOSIS — Z9181 History of falling: Secondary | ICD-10-CM | POA: Diagnosis not present

## 2021-10-29 DIAGNOSIS — Z7982 Long term (current) use of aspirin: Secondary | ICD-10-CM | POA: Diagnosis not present

## 2021-10-29 DIAGNOSIS — Z466 Encounter for fitting and adjustment of urinary device: Secondary | ICD-10-CM | POA: Diagnosis not present

## 2021-10-29 DIAGNOSIS — J690 Pneumonitis due to inhalation of food and vomit: Secondary | ICD-10-CM | POA: Diagnosis not present

## 2021-10-29 DIAGNOSIS — I2510 Atherosclerotic heart disease of native coronary artery without angina pectoris: Secondary | ICD-10-CM | POA: Diagnosis not present

## 2021-10-29 DIAGNOSIS — Z87440 Personal history of urinary (tract) infections: Secondary | ICD-10-CM | POA: Diagnosis not present

## 2021-10-29 DIAGNOSIS — E785 Hyperlipidemia, unspecified: Secondary | ICD-10-CM | POA: Diagnosis not present

## 2021-10-31 DIAGNOSIS — I10 Essential (primary) hypertension: Secondary | ICD-10-CM | POA: Diagnosis not present

## 2021-10-31 DIAGNOSIS — E871 Hypo-osmolality and hyponatremia: Secondary | ICD-10-CM | POA: Diagnosis not present

## 2021-10-31 DIAGNOSIS — I2510 Atherosclerotic heart disease of native coronary artery without angina pectoris: Secondary | ICD-10-CM | POA: Diagnosis not present

## 2021-10-31 DIAGNOSIS — E785 Hyperlipidemia, unspecified: Secondary | ICD-10-CM | POA: Diagnosis not present

## 2021-10-31 DIAGNOSIS — Z87440 Personal history of urinary (tract) infections: Secondary | ICD-10-CM | POA: Diagnosis not present

## 2021-10-31 DIAGNOSIS — Z96641 Presence of right artificial hip joint: Secondary | ICD-10-CM | POA: Diagnosis not present

## 2021-10-31 DIAGNOSIS — R627 Adult failure to thrive: Secondary | ICD-10-CM | POA: Diagnosis not present

## 2021-10-31 DIAGNOSIS — L89152 Pressure ulcer of sacral region, stage 2: Secondary | ICD-10-CM | POA: Diagnosis not present

## 2021-10-31 DIAGNOSIS — E039 Hypothyroidism, unspecified: Secondary | ICD-10-CM | POA: Diagnosis not present

## 2021-10-31 DIAGNOSIS — E1151 Type 2 diabetes mellitus with diabetic peripheral angiopathy without gangrene: Secondary | ICD-10-CM | POA: Diagnosis not present

## 2021-10-31 DIAGNOSIS — G20 Parkinson's disease: Secondary | ICD-10-CM | POA: Diagnosis not present

## 2021-10-31 DIAGNOSIS — K5900 Constipation, unspecified: Secondary | ICD-10-CM | POA: Diagnosis not present

## 2021-10-31 DIAGNOSIS — M1990 Unspecified osteoarthritis, unspecified site: Secondary | ICD-10-CM | POA: Diagnosis not present

## 2021-10-31 DIAGNOSIS — Z9181 History of falling: Secondary | ICD-10-CM | POA: Diagnosis not present

## 2021-10-31 DIAGNOSIS — R339 Retention of urine, unspecified: Secondary | ICD-10-CM | POA: Diagnosis not present

## 2021-10-31 DIAGNOSIS — Z466 Encounter for fitting and adjustment of urinary device: Secondary | ICD-10-CM | POA: Diagnosis not present

## 2021-10-31 DIAGNOSIS — Z7982 Long term (current) use of aspirin: Secondary | ICD-10-CM | POA: Diagnosis not present

## 2021-10-31 DIAGNOSIS — R1312 Dysphagia, oropharyngeal phase: Secondary | ICD-10-CM | POA: Diagnosis not present

## 2021-10-31 DIAGNOSIS — Z9842 Cataract extraction status, left eye: Secondary | ICD-10-CM | POA: Diagnosis not present

## 2021-10-31 DIAGNOSIS — J690 Pneumonitis due to inhalation of food and vomit: Secondary | ICD-10-CM | POA: Diagnosis not present

## 2021-10-31 DIAGNOSIS — D509 Iron deficiency anemia, unspecified: Secondary | ICD-10-CM | POA: Diagnosis not present

## 2021-10-31 DIAGNOSIS — E43 Unspecified severe protein-calorie malnutrition: Secondary | ICD-10-CM | POA: Diagnosis not present

## 2021-10-31 DIAGNOSIS — E876 Hypokalemia: Secondary | ICD-10-CM | POA: Diagnosis not present

## 2021-10-31 DIAGNOSIS — Z7401 Bed confinement status: Secondary | ICD-10-CM | POA: Diagnosis not present

## 2021-11-01 DIAGNOSIS — Z87440 Personal history of urinary (tract) infections: Secondary | ICD-10-CM | POA: Diagnosis not present

## 2021-11-01 DIAGNOSIS — R1312 Dysphagia, oropharyngeal phase: Secondary | ICD-10-CM | POA: Diagnosis not present

## 2021-11-01 DIAGNOSIS — Z7982 Long term (current) use of aspirin: Secondary | ICD-10-CM | POA: Diagnosis not present

## 2021-11-01 DIAGNOSIS — K5900 Constipation, unspecified: Secondary | ICD-10-CM | POA: Diagnosis not present

## 2021-11-01 DIAGNOSIS — I10 Essential (primary) hypertension: Secondary | ICD-10-CM | POA: Diagnosis not present

## 2021-11-01 DIAGNOSIS — E43 Unspecified severe protein-calorie malnutrition: Secondary | ICD-10-CM | POA: Diagnosis not present

## 2021-11-01 DIAGNOSIS — E876 Hypokalemia: Secondary | ICD-10-CM | POA: Diagnosis not present

## 2021-11-01 DIAGNOSIS — E871 Hypo-osmolality and hyponatremia: Secondary | ICD-10-CM | POA: Diagnosis not present

## 2021-11-01 DIAGNOSIS — Z96641 Presence of right artificial hip joint: Secondary | ICD-10-CM | POA: Diagnosis not present

## 2021-11-01 DIAGNOSIS — D509 Iron deficiency anemia, unspecified: Secondary | ICD-10-CM | POA: Diagnosis not present

## 2021-11-01 DIAGNOSIS — Z9842 Cataract extraction status, left eye: Secondary | ICD-10-CM | POA: Diagnosis not present

## 2021-11-01 DIAGNOSIS — L89152 Pressure ulcer of sacral region, stage 2: Secondary | ICD-10-CM | POA: Diagnosis not present

## 2021-11-01 DIAGNOSIS — Z7401 Bed confinement status: Secondary | ICD-10-CM | POA: Diagnosis not present

## 2021-11-01 DIAGNOSIS — Z466 Encounter for fitting and adjustment of urinary device: Secondary | ICD-10-CM | POA: Diagnosis not present

## 2021-11-01 DIAGNOSIS — J690 Pneumonitis due to inhalation of food and vomit: Secondary | ICD-10-CM | POA: Diagnosis not present

## 2021-11-01 DIAGNOSIS — E039 Hypothyroidism, unspecified: Secondary | ICD-10-CM | POA: Diagnosis not present

## 2021-11-01 DIAGNOSIS — I2510 Atherosclerotic heart disease of native coronary artery without angina pectoris: Secondary | ICD-10-CM | POA: Diagnosis not present

## 2021-11-01 DIAGNOSIS — Z9181 History of falling: Secondary | ICD-10-CM | POA: Diagnosis not present

## 2021-11-01 DIAGNOSIS — M1990 Unspecified osteoarthritis, unspecified site: Secondary | ICD-10-CM | POA: Diagnosis not present

## 2021-11-01 DIAGNOSIS — E1151 Type 2 diabetes mellitus with diabetic peripheral angiopathy without gangrene: Secondary | ICD-10-CM | POA: Diagnosis not present

## 2021-11-01 DIAGNOSIS — R339 Retention of urine, unspecified: Secondary | ICD-10-CM | POA: Diagnosis not present

## 2021-11-01 DIAGNOSIS — G20 Parkinson's disease: Secondary | ICD-10-CM | POA: Diagnosis not present

## 2021-11-01 DIAGNOSIS — E785 Hyperlipidemia, unspecified: Secondary | ICD-10-CM | POA: Diagnosis not present

## 2021-11-01 DIAGNOSIS — R627 Adult failure to thrive: Secondary | ICD-10-CM | POA: Diagnosis not present

## 2021-11-02 ENCOUNTER — Emergency Department (HOSPITAL_COMMUNITY): Payer: Medicare Other

## 2021-11-02 ENCOUNTER — Inpatient Hospital Stay (HOSPITAL_COMMUNITY)
Admission: EM | Admit: 2021-11-02 | Discharge: 2021-11-05 | DRG: 391 | Disposition: A | Discharge status: Home Health | Payer: Medicare Other | Attending: Internal Medicine | Admitting: Internal Medicine

## 2021-11-02 ENCOUNTER — Encounter (HOSPITAL_COMMUNITY): Payer: Self-pay

## 2021-11-02 ENCOUNTER — Other Ambulatory Visit: Payer: Self-pay

## 2021-11-02 DIAGNOSIS — Z79899 Other long term (current) drug therapy: Secondary | ICD-10-CM | POA: Diagnosis not present

## 2021-11-02 DIAGNOSIS — E876 Hypokalemia: Secondary | ICD-10-CM | POA: Diagnosis not present

## 2021-11-02 DIAGNOSIS — E43 Unspecified severe protein-calorie malnutrition: Secondary | ICD-10-CM | POA: Diagnosis not present

## 2021-11-02 DIAGNOSIS — R109 Unspecified abdominal pain: Secondary | ICD-10-CM | POA: Diagnosis not present

## 2021-11-02 DIAGNOSIS — Z87891 Personal history of nicotine dependence: Secondary | ICD-10-CM

## 2021-11-02 DIAGNOSIS — E785 Hyperlipidemia, unspecified: Secondary | ICD-10-CM | POA: Diagnosis present

## 2021-11-02 DIAGNOSIS — E7800 Pure hypercholesterolemia, unspecified: Secondary | ICD-10-CM | POA: Diagnosis present

## 2021-11-02 DIAGNOSIS — I2510 Atherosclerotic heart disease of native coronary artery without angina pectoris: Secondary | ICD-10-CM | POA: Diagnosis not present

## 2021-11-02 DIAGNOSIS — G20 Parkinson's disease: Secondary | ICD-10-CM | POA: Diagnosis present

## 2021-11-02 DIAGNOSIS — K5901 Slow transit constipation: Secondary | ICD-10-CM | POA: Diagnosis not present

## 2021-11-02 DIAGNOSIS — R627 Adult failure to thrive: Secondary | ICD-10-CM | POA: Diagnosis not present

## 2021-11-02 DIAGNOSIS — Z96641 Presence of right artificial hip joint: Secondary | ICD-10-CM | POA: Diagnosis present

## 2021-11-02 DIAGNOSIS — Z7982 Long term (current) use of aspirin: Secondary | ICD-10-CM

## 2021-11-02 DIAGNOSIS — K5900 Constipation, unspecified: Secondary | ICD-10-CM | POA: Diagnosis present

## 2021-11-02 DIAGNOSIS — Z9842 Cataract extraction status, left eye: Secondary | ICD-10-CM | POA: Diagnosis not present

## 2021-11-02 DIAGNOSIS — E039 Hypothyroidism, unspecified: Secondary | ICD-10-CM | POA: Diagnosis present

## 2021-11-02 DIAGNOSIS — Z7401 Bed confinement status: Secondary | ICD-10-CM

## 2021-11-02 DIAGNOSIS — E871 Hypo-osmolality and hyponatremia: Secondary | ICD-10-CM | POA: Diagnosis not present

## 2021-11-02 DIAGNOSIS — K6389 Other specified diseases of intestine: Secondary | ICD-10-CM | POA: Diagnosis not present

## 2021-11-02 DIAGNOSIS — K5909 Other constipation: Principal | ICD-10-CM | POA: Diagnosis present

## 2021-11-02 DIAGNOSIS — I10 Essential (primary) hypertension: Secondary | ICD-10-CM | POA: Diagnosis not present

## 2021-11-02 DIAGNOSIS — R1312 Dysphagia, oropharyngeal phase: Secondary | ICD-10-CM | POA: Diagnosis not present

## 2021-11-02 DIAGNOSIS — R140 Abdominal distension (gaseous): Secondary | ICD-10-CM | POA: Diagnosis not present

## 2021-11-02 DIAGNOSIS — M1990 Unspecified osteoarthritis, unspecified site: Secondary | ICD-10-CM | POA: Diagnosis present

## 2021-11-02 DIAGNOSIS — R339 Retention of urine, unspecified: Secondary | ICD-10-CM | POA: Diagnosis present

## 2021-11-02 DIAGNOSIS — R404 Transient alteration of awareness: Secondary | ICD-10-CM | POA: Diagnosis not present

## 2021-11-02 DIAGNOSIS — F0280 Dementia in other diseases classified elsewhere without behavioral disturbance: Secondary | ICD-10-CM | POA: Diagnosis present

## 2021-11-02 DIAGNOSIS — E1151 Type 2 diabetes mellitus with diabetic peripheral angiopathy without gangrene: Secondary | ICD-10-CM | POA: Diagnosis present

## 2021-11-02 DIAGNOSIS — R338 Other retention of urine: Secondary | ICD-10-CM | POA: Diagnosis present

## 2021-11-02 DIAGNOSIS — Z681 Body mass index (BMI) 19 or less, adult: Secondary | ICD-10-CM

## 2021-11-02 DIAGNOSIS — L89156 Pressure-induced deep tissue damage of sacral region: Secondary | ICD-10-CM | POA: Diagnosis present

## 2021-11-02 DIAGNOSIS — Z466 Encounter for fitting and adjustment of urinary device: Secondary | ICD-10-CM | POA: Diagnosis not present

## 2021-11-02 DIAGNOSIS — L89152 Pressure ulcer of sacral region, stage 2: Secondary | ICD-10-CM | POA: Diagnosis not present

## 2021-11-02 DIAGNOSIS — Z833 Family history of diabetes mellitus: Secondary | ICD-10-CM | POA: Diagnosis not present

## 2021-11-02 DIAGNOSIS — G20A1 Parkinson's disease without dyskinesia, without mention of fluctuations: Secondary | ICD-10-CM | POA: Diagnosis present

## 2021-11-02 DIAGNOSIS — Z9181 History of falling: Secondary | ICD-10-CM | POA: Diagnosis not present

## 2021-11-02 DIAGNOSIS — J690 Pneumonitis due to inhalation of food and vomit: Secondary | ICD-10-CM | POA: Diagnosis not present

## 2021-11-02 DIAGNOSIS — Z87440 Personal history of urinary (tract) infections: Secondary | ICD-10-CM | POA: Diagnosis not present

## 2021-11-02 DIAGNOSIS — D509 Iron deficiency anemia, unspecified: Secondary | ICD-10-CM | POA: Diagnosis not present

## 2021-11-02 DIAGNOSIS — N200 Calculus of kidney: Secondary | ICD-10-CM | POA: Diagnosis not present

## 2021-11-02 DIAGNOSIS — R531 Weakness: Secondary | ICD-10-CM | POA: Diagnosis not present

## 2021-11-02 LAB — CBC WITH DIFFERENTIAL/PLATELET
Abs Immature Granulocytes: 0.03 10*3/uL (ref 0.00–0.07)
Basophils Absolute: 0 10*3/uL (ref 0.0–0.1)
Basophils Relative: 1 %
Eosinophils Absolute: 0.2 10*3/uL (ref 0.0–0.5)
Eosinophils Relative: 2 %
HCT: 41.3 % (ref 39.0–52.0)
Hemoglobin: 13.5 g/dL (ref 13.0–17.0)
Immature Granulocytes: 0 %
Lymphocytes Relative: 15 %
Lymphs Abs: 1.1 10*3/uL (ref 0.7–4.0)
MCH: 31.5 pg (ref 26.0–34.0)
MCHC: 32.7 g/dL (ref 30.0–36.0)
MCV: 96.3 fL (ref 80.0–100.0)
Monocytes Absolute: 0.6 10*3/uL (ref 0.1–1.0)
Monocytes Relative: 8 %
Neutro Abs: 5 10*3/uL (ref 1.7–7.7)
Neutrophils Relative %: 74 %
Platelets: 272 10*3/uL (ref 150–400)
RBC: 4.29 MIL/uL (ref 4.22–5.81)
RDW: 14.2 % (ref 11.5–15.5)
WBC: 6.9 10*3/uL (ref 4.0–10.5)
nRBC: 0 % (ref 0.0–0.2)

## 2021-11-02 LAB — COMPREHENSIVE METABOLIC PANEL
ALT: <5 U/L (ref 0–44)
AST: 14 U/L — ABNORMAL LOW (ref 15–41)
Albumin: 3.4 g/dL — ABNORMAL LOW (ref 3.5–5.0)
Alkaline Phosphatase: 85 U/L (ref 38–126)
Anion gap: 6 (ref 5–15)
BUN: 16 mg/dL (ref 8–23)
CO2: 25 mmol/L (ref 22–32)
Calcium: 9 mg/dL (ref 8.9–10.3)
Chloride: 104 mmol/L (ref 98–111)
Creatinine, Ser: 0.44 mg/dL — ABNORMAL LOW (ref 0.61–1.24)
GFR, Estimated: >60 mL/min (ref >60)
Glucose, Bld: 130 mg/dL — ABNORMAL HIGH (ref 70–99)
Potassium: 3.8 mmol/L (ref 3.5–5.1)
Sodium: 135 mmol/L (ref 135–145)
Total Bilirubin: 1.1 mg/dL (ref 0.3–1.2)
Total Protein: 7.4 g/dL (ref 6.5–8.1)

## 2021-11-02 LAB — CK: Total CK: 21 U/L — ABNORMAL LOW (ref 49–397)

## 2021-11-02 MED ORDER — IOHEXOL 300 MG/ML  SOLN
100.0000 mL | Freq: Once | INTRAMUSCULAR | Status: AC | PRN
  Administered 2021-11-02: 100 mL via INTRAVENOUS

## 2021-11-02 MED ORDER — BISACODYL 10 MG RE SUPP: 10.0000 mg | Freq: Every day | RECTAL | Status: DC | PRN

## 2021-11-02 MED ORDER — CARBIDOPA-LEVODOPA 25-100 MG PO TABS
1.0000 | ORAL_TABLET | Freq: Four times a day (QID) | ORAL | Status: DC
  Administered 2021-11-02 – 2021-11-05 (×10): 1 via ORAL
  Filled 2021-11-02 (×10): qty 1

## 2021-11-02 MED ORDER — ATORVASTATIN CALCIUM 10 MG PO TABS
10.0000 mg | ORAL_TABLET | Freq: Every day | ORAL | Status: DC
  Administered 2021-11-03 – 2021-11-05 (×3): 10 mg via ORAL
  Filled 2021-11-02 (×3): qty 1

## 2021-11-02 MED ORDER — ONDANSETRON HCL 4 MG PO TABS: 4.0000 mg | ORAL_TABLET | Freq: Four times a day (QID) | ORAL | Status: DC | PRN

## 2021-11-02 MED ORDER — POLYETHYLENE GLYCOL 3350 17 G PO PACK
17.0000 g | PACK | Freq: Three times a day (TID) | ORAL | Status: DC
  Administered 2021-11-02 – 2021-11-05 (×8): 17 g via ORAL
  Filled 2021-11-02 (×9): qty 1

## 2021-11-02 MED ORDER — BISACODYL 10 MG RE SUPP
10.0000 mg | Freq: Once | RECTAL | Status: AC
  Administered 2021-11-02: 10 mg via RECTAL
  Filled 2021-11-02: qty 1

## 2021-11-02 MED ORDER — TIZANIDINE HCL 4 MG PO TABS
4.0000 mg | ORAL_TABLET | Freq: Four times a day (QID) | ORAL | Status: DC | PRN
  Administered 2021-11-02 – 2021-11-04 (×3): 4 mg via ORAL
  Filled 2021-11-02 (×3): qty 1

## 2021-11-02 MED ORDER — ACETAMINOPHEN 650 MG RE SUPP: 650.0000 mg | Freq: Four times a day (QID) | RECTAL | Status: DC | PRN

## 2021-11-02 MED ORDER — CARBIDOPA-LEVODOPA ER 50-200 MG PO TBCR
1.0000 | EXTENDED_RELEASE_TABLET | Freq: Every day | ORAL | Status: DC
  Administered 2021-11-02 – 2021-11-04 (×3): 1 via ORAL
  Filled 2021-11-02 (×3): qty 1

## 2021-11-02 MED ORDER — ONDANSETRON HCL 4 MG/2ML IJ SOLN: 4.0000 mg | Freq: Four times a day (QID) | INTRAMUSCULAR | Status: DC | PRN

## 2021-11-02 MED ORDER — QUETIAPINE FUMARATE 25 MG PO TABS
12.5000 mg | ORAL_TABLET | Freq: Every day | ORAL | Status: DC
  Administered 2021-11-02 – 2021-11-04 (×3): 12.5 mg via ORAL
  Filled 2021-11-02 (×3): qty 1

## 2021-11-02 MED ORDER — LACTULOSE 10 GM/15ML PO SOLN
30.0000 g | Freq: Two times a day (BID) | ORAL | Status: DC
  Administered 2021-11-02 – 2021-11-05 (×7): 30 g via ORAL
  Filled 2021-11-02 (×7): qty 45

## 2021-11-02 MED ORDER — TAMSULOSIN HCL 0.4 MG PO CAPS
0.4000 mg | ORAL_CAPSULE | Freq: Every day | ORAL | Status: DC
  Administered 2021-11-02 – 2021-11-04 (×3): 0.4 mg via ORAL
  Filled 2021-11-02 (×3): qty 1

## 2021-11-02 MED ORDER — ENOXAPARIN SODIUM 40 MG/0.4ML IJ SOSY
40.0000 mg | PREFILLED_SYRINGE | INTRAMUSCULAR | Status: DC
  Administered 2021-11-02 – 2021-11-04 (×3): 40 mg via SUBCUTANEOUS
  Filled 2021-11-02 (×3): qty 0.4

## 2021-11-02 MED ORDER — SENNA 8.6 MG PO TABS
2.0000 | ORAL_TABLET | Freq: Two times a day (BID) | ORAL | Status: DC
  Administered 2021-11-02 – 2021-11-05 (×7): 17.2 mg via ORAL
  Filled 2021-11-02 (×7): qty 2

## 2021-11-02 MED ORDER — ACETAMINOPHEN 325 MG PO TABS
650.0000 mg | ORAL_TABLET | Freq: Four times a day (QID) | ORAL | Status: DC | PRN
  Administered 2021-11-04: 650 mg via ORAL
  Filled 2021-11-02 (×2): qty 2

## 2021-11-02 MED ORDER — ASPIRIN 81 MG PO TBEC
81.0000 mg | DELAYED_RELEASE_TABLET | Freq: Every day | ORAL | Status: DC
  Administered 2021-11-03 – 2021-11-05 (×3): 81 mg via ORAL
  Filled 2021-11-02 (×3): qty 1

## 2021-11-02 MED ORDER — ALBUTEROL SULFATE (2.5 MG/3ML) 0.083% IN NEBU: 2.5000 mg | INHALATION_SOLUTION | RESPIRATORY_TRACT | Status: DC | PRN

## 2021-11-02 NOTE — Progress Notes (Signed)
[facility] TJ[facility]) Hospital Liaison note:  This patient is currently enrolled in [facility] outpatient-based Palliative Care. Will continue to follow for disposition.  Please call with any outpatient palliative questions or concerns.  Thank you, Lorelee Market, LPN [facility] Liaison [phone]

## 2021-11-02 NOTE — ED Triage Notes (Signed)
Pt BIB EMS per home health nurse pt has not had bm since 7/24. Hx dementia

## 2021-11-02 NOTE — H&P (Signed)
[facility] History and Physical  Rodney Love FFM:[id number] DOB: June 30, 1945 DOA: 11/02/2021   PCP: Rodney Amel, MD  Specialists: Followed by Dr. Sallyanne Love with cardiology  Chief Complaint: Constipation  HPI: Rodney Love is a 77 y.o. male with a past medical history of Parkinson's disease, bedbound status, recent urinary retention with Foley catheter, essential hypertension, coronary artery disease, peripheral artery disease, constipation who was recently hospitalized for acute urinary retention.  Found to have a urinary tract infection.  Was treated with antibiotics.  A Foley catheter was placed.  Started on Flomax.  Patient has an appointment with urology next week for voiding trial.  At that hospitalization patient also had findings suggestive of aspiration pneumonia.  He was stabilized and then discharged home.  He was found to have fecal impaction during previous hospitalization which was treated with MiraLAX and Senokot and enemas.  Patient was sent over to the emergency department today due to concern that he has not had a bowel movement in almost a week now.  Has had some lower back pain but denies any abdominal pain nausea or vomiting.  He has been tolerating his dysphagia 1 diet at home.  No chest pain shortness of breath.  In the emergency department vital signs are noted to be stable.  Blood work was unremarkable.  CT scan however showed significant stool burden with distention of the bowel as result of stool in the rectum.  Was ordered in the ED.  He will need to be hospitalized for further management.  Home Medications: Prior to Admission medications   Medication Sig Start Date End Date Taking? Authorizing Provider  acetaminophen (TYLENOL) 500 MG tablet Take 500 mg by mouth every 6 (six) hours as needed for moderate pain.   Yes [provider]  aspirin 81 MG EC tablet Take 81 mg by mouth daily.   Yes [provider]  atorvastatin (LIPITOR) 10 MG  tablet TAKE 1 TABLET(10 MG) BY MOUTH DAILY Patient taking differently: Take 10 mg by mouth daily. 08/06/21  Yes Croitoru, Mihai, MD  carbidopa-levodopa (SINEMET CR) 50-200 MG tablet TAKE 1 TABLET BY MOUTH EVERY NIGHT AT BEDTIME 10/11/21  Yes Rodney Sprang, MD  carbidopa-levodopa (SINEMET IR) 25-100 MG tablet Take 1 tablet by mouth See admin instructions. Take 1 tablet by mouth at 7 AM, 10 AM, 1 PM, and 4 PM   Yes [provider]  cholecalciferol (VITAMIN D3) 25 MCG (1000 UNIT) tablet Take 1,000 Units by mouth daily.   Yes [provider]  Cyanocobalamin (VITAMIN B 12 PO) Take 1 tablet by mouth daily.   Yes [provider]  ferrous sulfate 324 MG TBEC Take 324 mg by mouth daily with breakfast.   Yes [provider]  food thickener (SIMPLYTHICK, NECTAR/LEVEL 2/MILDLY THICK,) GEL Take 1 packet by mouth as needed. Patient taking differently: Take 1 packet by mouth daily as needed (food prep). 10/22/21  Yes Rodney Dike, MD  guaiFENesin (MUCINEX) 600 MG 12 hr tablet Take 600 mg by mouth every 12 (twelve) hours as needed for to loosen phlegm or cough.   Yes [provider]  polyethylene glycol (MIRALAX / GLYCOLAX) 17 g packet Take 17 g by mouth 2 (two) times daily. 10/22/21  Yes Rodney Dike, MD  Potassium 99 MG TABS Take 99 mg by mouth daily.   Yes [provider]  QUEtiapine (SEROQUEL) 25 MG tablet Take 0.5 tablets (12.5 mg total) by mouth at bedtime. 10/22/21  Yes Rodney Dike, MD  senna (SENOKOT) 8.6 MG TABS tablet Take 1 tablet (8.6 mg total) by mouth 2 (two) times daily. Patient taking differently: Take 1 tablet by mouth 2 (two) times daily as needed for mild constipation. 05/05/20  Yes Rodney Filler, MD  tamsulosin (FLOMAX) 0.4 MG CAPS capsule Take 1 capsule (0.4 mg total) by mouth daily after supper. 10/22/21  Yes Rodney Dike, MD  tiZANidine (ZANAFLEX) 4 MG tablet Take 1 tablet (4 mg total) by mouth every 6 (six) hours as needed for  muscle spasms. 10/22/21  Yes Rodney Dike, MD  [facility] ULTRA test strip  02/04/19   [provider]  pramipexole (MIRAPEX) 0.125 MG tablet Take 0.125 mg by mouth at bedtime.  10/15/19  [provider]    Allergies: No Known Allergies  Past Medical History: Past Medical History:  Diagnosis Date   Arthritis    Constipation    OCCASIONAL   Coronary artery disease    Diabetes mellitus without complication ([place])    Environmental allergies    Hyperlipidemia    Hypertension    Inguinal hernia    Iron deficiency    Nocturia    Parkinson disease ([place])    Peripheral arterial disease ([place])     Past Surgical History:  Procedure Laterality Date   BACK SURGERY     CARDIAC CATHETERIZATION  05/29/2006   single vessel CAD w/moderate stenosis of the prox. RCA approx 40%,prox. LAD 20-30%,minor irreg. mid abdominal aorta,left iliac artery disease 50-60%   CATARACT EXTRACTION Left 12/2016   HERNIA REPAIR  [id number]   INGUINAL HERNIA REPAIR N/A 09/23/2012   Procedure: LAPAROSCOPIC INGUINAL HERNIA with umbilical hernia;  Surgeon: Madilyn Hook, DO;  Location: [facility];  Service: General;  Laterality: N/A;   INSERTION OF MESH Bilateral 09/23/2012   Procedure: INSERTION OF MESH;  Surgeon: Madilyn Hook, DO;  Location: [facility];  Service: General;  Laterality: Bilateral;   TOTAL HIP ARTHROPLASTY Right 04/29/2020   Procedure: TOTAL HIP ARTHROPLASTY ANTERIOR APPROACH;  Surgeon: Rod Can, MD;  Location: [facility];  Service: Orthopedics;  Laterality: Right;    Social History: Lives at home with his wife.  Bedbound.  Family History:  Family History  Problem Relation Age of Onset   Diabetes Mother    Kidney disease Mother    Stroke Father    Stroke Brother    Cancer Sister    Cancer Sister    Diabetes Sister    Healthy Daughter    Healthy Daughter    Healthy Son      Review of Systems - History obtained from the patient General ROS: positive for  - fatigue Psychological ROS:  negative Ophthalmic ROS: negative ENT ROS: negative Allergy and Immunology ROS: negative Hematological and Lymphatic ROS: negative Endocrine ROS: negative Respiratory ROS: no cough, shortness of breath, or wheezing Cardiovascular ROS: no chest pain or dyspnea on exertion Gastrointestinal ROS: no abdominal pain, change in bowel habits, or black or bloody stools Genito-Urinary ROS: no dysuria, trouble voiding, or hematuria Musculoskeletal ROS: As in HPI Neurological ROS: no TIA or stroke symptoms Dermatological ROS: negative  Physical Examination  Vitals:   11/02/21 1321 11/02/21 1322  BP: (!) 149/78   Pulse: 99   Resp: 18   Temp: 98 F (36.7 C)   TempSrc: Oral   SpO2: 97%   Weight:  58 kg  Height:  '6\' 1"'$  (1.854 m)    BP (!) 149/78   Pulse 99   Temp 98 F (36.7 C) (Oral)  Resp 18   Ht '6\' 1"'$  (1.854 m)   Wt 58 kg   SpO2 97%   BMI 16.87 kg/m   General appearance: alert, cooperative, and no distress Head: Normocephalic, without obvious abnormality, atraumatic Eyes: conjunctivae/corneas clear. PERRL, EOM's intact.  Throat: lips, mucosa, and tongue normal; teeth and gums normal Neck: no adenopathy, no carotid bruit, no JVD, supple, symmetrical, trachea midline, and thyroid not enlarged, symmetric, no tenderness/mass/nodules Resp: clear to auscultation bilaterally Cardio: regular rate and rhythm, S1, S2 normal, no murmur, click, rub or gallop GI: soft, non-tender; bowel sounds normal; no masses,  no organomegaly Extremities: extremities normal, atraumatic, no cyanosis or edema Pulses: 2+ and symmetric Skin: Skin color, texture, turgor normal. No rashes or lesions Lymph nodes: Cervical, supraclavicular, and axillary nodes normal. Neurologic: Awake alert.  Mildly distracted.  No obvious focal neurological deficits though he is bedbound at baseline.   Labs on Admission: I have personally reviewed following labs and imaging studies  CBC: Recent Labs  Lab 11/02/21 1325   WBC 6.9  NEUTROABS 5.0  HGB 13.5  HCT 41.3  MCV 96.3  PLT 546   Basic Metabolic Panel: Recent Labs  Lab 11/02/21 1325  NA 135  K 3.8  CL 104  CO2 25  GLUCOSE 130*  BUN 16  CREATININE 0.44*  CALCIUM 9.0   GFR: Estimated Creatinine Clearance: 62.4 mL/min (A) (by C-G formula based on SCr of 0.44 mg/dL (L)). Liver Function Tests: Recent Labs  Lab 11/02/21 1325  AST 14*  ALT <5  ALKPHOS 85  BILITOT 1.1  PROT 7.4  ALBUMIN 3.4*    Cardiac Enzymes: Recent Labs  Lab 11/02/21 1325  CKTOTAL 21*     Radiological Exams on Admission: CT ABDOMEN PELVIS W CONTRAST  Result Date: 11/02/2021 CLINICAL DATA:  Bowel obstruction suspected, no bowel movement EXAM: CT ABDOMEN AND PELVIS WITH CONTRAST TECHNIQUE: Multidetector CT imaging of the abdomen and pelvis was performed using the standard protocol following bolus administration of intravenous contrast. RADIATION DOSE REDUCTION: This exam was performed according to the departmental dose-optimization program which includes automated exposure control, adjustment of the mA and/or kV according to patient size and/or use of iterative reconstruction technique. CONTRAST:  156m OMNIPAQUE IOHEXOL 300 MG/ML  SOLN COMPARISON:  10/14/2021 FINDINGS: Lower chest: No acute abnormality. Coronary artery calcifications. Scarring of the right lung base. Hepatobiliary: No solid liver abnormality is seen. No gallstones, gallbladder wall thickening, or biliary dilatation. Pancreas: Unremarkable. No pancreatic ductal dilatation or surrounding inflammatory changes. Spleen: Normal in size without significant abnormality. Adrenals/Urinary Tract: Adrenal glands are unremarkable. Small bilateral nonobstructive calculi and or renal vascular calcifications. No hydronephrosis. Foley catheter in the urinary bladder. Stomach/Bowel: Stomach is within normal limits. Appendix appears normal. No evidence of bowel wall thickening, distention, or inflammatory changes. Small  bowel is diffusely gas-filled although nondistended. Large burden of stool throughout the colon including very large stool balls in the rectum, measuring at least 12.2 cm (series 6, image 85). Vascular/Lymphatic: Aortic atherosclerosis. No enlarged abdominal or pelvic lymph nodes. Reproductive: No mass or other significant abnormality. Other: No abdominal wall hernia or abnormality. No ascites. Musculoskeletal: No acute or significant osseous findings. Status post right hip total arthroplasty. IMPRESSION: 1. Large burden of stool throughout the colon including very large stool balls in the rectum, measuring at least 12.2 cm. Correlate for fecal impaction. 2. Small bilateral nonobstructive calculi and or renal vascular calcifications. No hydronephrosis. 3. Coronary artery disease. Aortic Atherosclerosis (ICD10-I70.0). Electronically Signed   By: ALanae Crumbly  Laqueta Carina M.D.   On: 11/02/2021 14:39   DG Abdomen 1 View  Result Date: 11/02/2021 CLINICAL DATA:  r/o obstruction.  Abdominal pain and distention. EXAM: ABDOMEN - 1 VIEW 2 images COMPARISON:  October 17, 2021 FINDINGS: There is moderate gaseous distention of the small and large bowels seen and has mildly increased in the interim. Large stool burden in the rectum. 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 Right hip prosthesis, stable. IMPRESSION: Mild gaseous distention of the small and large bowels and has increased in the interim which may be due to ileus. Early bowel obstruction cannot be entirely excluded. Large stool burden in the rectum. Electronically Signed   By: Frazier Richards M.D.   On: 11/02/2021 14:24      Problem List  Principal Problem:   Constipation Active Problems:   Essential hypertension   PD (Parkinson's disease) ([place])   Acute urinary retention   Assessment: This is a 76 year old Caucasian male with a past medical history of Parkinson's disease who comes in with 1 week history of constipation.  Large stool burden noted on the  colon with evidence for bowel distention.  Plan:  #1. Severe constipation: Check thyroid function test.  Enemas has been ordered by the ED provider.  He will likely need disimpaction.  Continue with MiraLAX Senokot.  We will add lactulose.  No colonoscopy reports noted in the system.  Will inquire from family as to if he has ever had a colonoscopy or if he is followed by gastroenterology.  If patient does not have relief with measures described above we will discuss with gastroenterology.  #2. Parkinson's disease: Continue with Sinemet.  Patient is bedbound.  #3.  Urinary retention: Had acute urinary retention during his previous hospitalization in July.  Has a Foley catheter which was placed at that time.  Continue Flomax.  According to daughter patient has an appointment next week with urology.  #4.  History of essential hypertension: Not noted to be on any antihypertensives.  Monitor blood pressures closely.  #5 Sacral decubitus/deep tissue pressure injury and evaluation, present on admission.  This was noted during previous hospitalization.  Wound care nurse will be consulted.  #6 Goals of care: Patient was seen by palliative care during.  Hospitalization.  Palliative care was supposed to follow patient at home.  Remains a full code.  DVT Prophylaxis: Lovenox Code Status: Full code Family Communication: Discussed with patient's wife and daughter Disposition: Hopefully return home when improved Consults called: None yet Admission Status: Status is: Observation The patient remains OBS appropriate and will d/c before 2 midnights.    Severity of Illness: The appropriate patient status for this patient is OBSERVATION. Observation status is judged to be reasonable and necessary in order to provide the required intensity of service to ensure the patient's safety. The patient's presenting symptoms, physical exam findings, and initial radiographic and laboratory data in the context of their  medical condition is felt to place them at decreased risk for further clinical deterioration. Furthermore, it is anticipated that the patient will be medically stable for discharge from the hospital within 2 midnights of admission.    Further management decisions will depend on results of further testing and patient's response to treatment.   Rodney Love [employer]  [facility] operational officer on [email or web address]  11/02/2021, 3:49 PM

## 2021-11-02 NOTE — ED Provider Notes (Signed)
[facility] DEPT Provider Note  CSN: [id number] Arrival date & time: 11/02/21 1309  Chief Complaint(s) Constipation  HPI Rodney Love is a 76 y.o. male with PMH Parkinson's disease, dementia, severe malnutrition, sacral decubitus ulcers, T2DM, PAD, hypothyroidism, recent discharge on 10/22/2021 after an extended hospital stay for acute urinary retention, small bowel obstruction versus ileus, UTI and aspiration pneumonia who presents emergency department for evaluation of constipation.  Patient has no complaints today in the emergency department but home health nurses are concerned that the patient has not had a bowel movement since hospital discharge and they state that they feel the patient is distended.  Patient has not had any vomiting, fevers, complaints of chest pain, shortness of breath, headaches or other systemic symptoms.  Patient has been eating daily.   Past Medical History Past Medical History:  Diagnosis Date   Arthritis    Constipation    OCCASIONAL   Coronary artery disease    Diabetes mellitus without complication (HCC)    Environmental allergies    Hyperlipidemia    Hypertension    Inguinal hernia    Iron deficiency    Nocturia    Parkinson disease ([place])    Peripheral arterial disease ([place])    Patient Active Problem List   Diagnosis Date Noted   Hypernatremia 10/18/2021   Decubitus ulcer of sacral area 10/16/2021   Protein-calorie malnutrition, severe ([place]) 10/16/2021   Failure to thrive in adult 10/16/2021   Acquired hypothyroidism 10/15/2021   AKI (acute kidney injury) ([place]) 10/14/2021   Ileus ([place]) 10/14/2021   Community acquired pneumonia 10/14/2021   Acute urinary retention 10/14/2021   Pressure injury of skin 05/03/2020   Femur fracture, right ([place]) 04/28/2020   PD (Parkinson's disease) ([place]) 12/18/2015   Tremor of both hands 11/22/2015   PAD (peripheral artery disease) s/p remote left external iliac artery angioplasty  10/28/2012   Coronary atherosclerosis 10/28/2012   Hypercholesterolemia 10/28/2012   Essential hypertension 10/28/2012   Diabetes mellitus type 2 in nonobese ([place]) 10/28/2012   Carotid occlusion, right 10/28/2012   Home Medication(s) Prior to Admission medications   Medication Sig Start Date End Date Taking? Authorizing Provider  acetaminophen (TYLENOL) 500 MG tablet Take 500 mg by mouth every 6 (six) hours as needed for moderate pain.   Yes [provider]  aspirin 81 MG EC tablet Take 81 mg by mouth daily.   Yes [provider]  atorvastatin (LIPITOR) 10 MG tablet TAKE 1 TABLET(10 MG) BY MOUTH DAILY Patient taking differently: Take 10 mg by mouth daily. 08/06/21  Yes Croitoru, Mihai, MD  carbidopa-levodopa (SINEMET CR) 50-200 MG tablet TAKE 1 TABLET BY MOUTH EVERY NIGHT AT BEDTIME 10/11/21  Yes Cameron Sprang, MD  carbidopa-levodopa (SINEMET IR) 25-100 MG tablet Take 1 tablet by mouth See admin instructions. Take 1 tablet by mouth at 7 AM, 10 AM, 1 PM, and 4 PM   Yes [provider]  cholecalciferol (VITAMIN D3) 25 MCG (1000 UNIT) tablet Take 1,000 Units by mouth daily.   Yes [provider]  Cyanocobalamin (VITAMIN B 12 PO) Take 1 tablet by mouth daily.   Yes [provider]  ferrous sulfate 324 MG TBEC Take 324 mg by mouth daily with breakfast.   Yes [provider]  food thickener (SIMPLYTHICK, NECTAR/LEVEL 2/MILDLY THICK,) GEL Take 1 packet by mouth as needed. Patient taking differently: Take 1 packet by mouth daily as needed (food prep). 10/22/21  Yes Kathie Dike, MD  guaiFENesin [facility])  600 MG 12 hr tablet Take 600 mg by mouth every 12 (twelve) hours as needed for to loosen phlegm or cough.   Yes [provider]  polyethylene glycol (MIRALAX / GLYCOLAX) 17 g packet Take 17 g by mouth 2 (two) times daily. 10/22/21  Yes Kathie Dike, MD  Potassium 99 MG TABS Take 99 mg by mouth daily.   Yes [provider]   QUEtiapine (SEROQUEL) 25 MG tablet Take 0.5 tablets (12.5 mg total) by mouth at bedtime. 10/22/21  Yes Kathie Dike, MD  senna (SENOKOT) 8.6 MG TABS tablet Take 1 tablet (8.6 mg total) by mouth 2 (two) times daily. Patient taking differently: Take 1 tablet by mouth 2 (two) times daily as needed for mild constipation. 05/05/20  Yes Eugenie Filler, MD  tamsulosin (FLOMAX) 0.4 MG CAPS capsule Take 1 capsule (0.4 mg total) by mouth daily after supper. 10/22/21  Yes Kathie Dike, MD  tiZANidine (ZANAFLEX) 4 MG tablet Take 1 tablet (4 mg total) by mouth every 6 (six) hours as needed for muscle spasms. 10/22/21  Yes Kathie Dike, MD  [facility] ULTRA test strip  02/04/19   [provider]  pramipexole (MIRAPEX) 0.125 MG tablet Take 0.125 mg by mouth at bedtime.  10/15/19  [provider]                                                                                                                                    Past Surgical History Past Surgical History:  Procedure Laterality Date   BACK SURGERY     CARDIAC CATHETERIZATION  05/29/2006   single vessel CAD w/moderate stenosis of the prox. RCA approx 40%,prox. LAD 20-30%,minor irreg. mid abdominal aorta,left iliac artery disease 50-60%   CATARACT EXTRACTION Left 12/2016   HERNIA REPAIR  [id number]   INGUINAL HERNIA REPAIR N/A 09/23/2012   Procedure: LAPAROSCOPIC INGUINAL HERNIA with umbilical hernia;  Surgeon: Madilyn Hook, DO;  Location: [facility] ORS;  Service: General;  Laterality: N/A;   INSERTION OF MESH Bilateral 09/23/2012   Procedure: INSERTION OF MESH;  Surgeon: Madilyn Hook, DO;  Location: [facility] ORS;  Service: General;  Laterality: Bilateral;   TOTAL HIP ARTHROPLASTY Right 04/29/2020   Procedure: TOTAL HIP ARTHROPLASTY ANTERIOR APPROACH;  Surgeon: Rod Can, MD;  Location: [facility] ORS;  Service: Orthopedics;  Laterality: Right;   Family History Family History  Problem Relation Age of Onset   Diabetes Mother    Kidney disease  Mother    Stroke Father    Stroke Brother    Cancer Sister    Cancer Sister    Diabetes Sister    Healthy Daughter    Healthy Daughter    Healthy Son     Social History Social History   Tobacco Use   Smoking status: Former    Types: Cigarettes    Quit date: 04/02/1991    Years since quitting: 30.6   Smokeless tobacco: Former  Quit date: 08/06/1991  Vaping Use   Vaping Use: Never used  Substance Use Topics   Alcohol use: No   Drug use: No   Allergies Patient has no known allergies.  Review of Systems Review of Systems  Gastrointestinal:  Positive for constipation.    Physical Exam Vital Signs  I have reviewed the triage vital signs BP (!) 149/78   Pulse 99   Temp 98 F (36.7 C) (Oral)   Resp 18   Ht '6\' 1"'$  (1.854 m)   Wt 58 kg   SpO2 97%   BMI 16.87 kg/m   Physical Exam Constitutional:      General: He is not in acute distress.    Appearance: Normal appearance.  HENT:     Head: Normocephalic and atraumatic.     Nose: No congestion or rhinorrhea.  Eyes:     General:        Right eye: No discharge.        Left eye: No discharge.     Extraocular Movements: Extraocular movements intact.     Pupils: Pupils are equal, round, and reactive to light.  Cardiovascular:     Rate and Rhythm: Normal rate and regular rhythm.     Heart sounds: No murmur heard. Pulmonary:     Effort: No respiratory distress.     Breath sounds: No wheezing or rales.  Abdominal:     General: There is distension.     Tenderness: There is no abdominal tenderness.  Musculoskeletal:        General: Normal range of motion.     Cervical back: Normal range of motion.  Skin:    General: Skin is warm and dry.  Neurological:     General: No focal deficit present.     Mental Status: He is alert.     ED Results and Treatments Labs (all labs ordered are listed, but only abnormal results are displayed) Labs Reviewed  URINE CULTURE  CBC WITH DIFFERENTIAL/PLATELET  COMPREHENSIVE  METABOLIC PANEL  URINALYSIS, ROUTINE W REFLEX MICROSCOPIC  CK                                                                                                                          Radiology CT ABDOMEN PELVIS W CONTRAST  Result Date: 11/02/2021 CLINICAL DATA:  Bowel obstruction suspected, no bowel movement EXAM: CT ABDOMEN AND PELVIS WITH CONTRAST TECHNIQUE: Multidetector CT imaging of the abdomen and pelvis was performed using the standard protocol following bolus administration of intravenous contrast. RADIATION DOSE REDUCTION: This exam was performed according to the departmental dose-optimization program which includes automated exposure control, adjustment of the mA and/or kV according to patient size and/or use of iterative reconstruction technique. CONTRAST:  125m OMNIPAQUE IOHEXOL 300 MG/ML  SOLN COMPARISON:  10/14/2021 FINDINGS: Lower chest: No acute abnormality. Coronary artery calcifications. Scarring of the right lung base. Hepatobiliary: No solid liver abnormality is seen. No gallstones, gallbladder wall thickening, or biliary dilatation. Pancreas: Unremarkable.  No pancreatic ductal dilatation or surrounding inflammatory changes. Spleen: Normal in size without significant abnormality. Adrenals/Urinary Tract: Adrenal glands are unremarkable. Small bilateral nonobstructive calculi and or renal vascular calcifications. No hydronephrosis. Foley catheter in the urinary bladder. Stomach/Bowel: Stomach is within normal limits. Appendix appears normal. No evidence of bowel wall thickening, distention, or inflammatory changes. Small bowel is diffusely gas-filled although nondistended. Large burden of stool throughout the colon including very large stool balls in the rectum, measuring at least 12.2 cm (series 6, image 85). Vascular/Lymphatic: Aortic atherosclerosis. No enlarged abdominal or pelvic lymph nodes. Reproductive: No mass or other significant abnormality. Other: No abdominal wall hernia or  abnormality. No ascites. Musculoskeletal: No acute or significant osseous findings. Status post right hip total arthroplasty. IMPRESSION: 1. Large burden of stool throughout the colon including very large stool balls in the rectum, measuring at least 12.2 cm. Correlate for fecal impaction. 2. Small bilateral nonobstructive calculi and or renal vascular calcifications. No hydronephrosis. 3. Coronary artery disease. Aortic Atherosclerosis (ICD10-I70.0). Electronically Signed   By: Delanna Ahmadi M.D.   On: 11/02/2021 14:39   DG Abdomen 1 View  Result Date: 11/02/2021 CLINICAL DATA:  r/o obstruction.  Abdominal pain and distention. EXAM: ABDOMEN - 1 VIEW 2 images COMPARISON:  October 17, 2021 FINDINGS: There is moderate gaseous distention of the small and large bowels seen and has mildly increased in the interim. Large stool burden in the rectum. 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 Right hip prosthesis, stable. IMPRESSION: Mild gaseous distention of the small and large bowels and has increased in the interim which may be due to ileus. Early bowel obstruction cannot be entirely excluded. Large stool burden in the rectum. Electronically Signed   By: Frazier Richards M.D.   On: 11/02/2021 14:24    Pertinent labs & imaging results that were available during my care of the patient were reviewed by me and considered in my medical decision making (see MDM for details).  Medications Ordered in ED Medications  iohexol (OMNIPAQUE) 300 MG/ML solution 100 mL (100 mLs Intravenous Contrast Given 11/02/21 1417)                                                                                                                                     Procedures Procedures  (including critical care time)  Medical Decision Making / ED Course   This patient presents to the ED for concern of constipation, this involves an extensive number of treatment options, and is a complaint that carries with it a high risk of  complications and morbidity.  The differential diagnosis includes SBO, ileus, fecal impaction, decompensated Parkinson's disease  MDM: Patient seen in the emergency room for evaluation of constipation.  Physical exam reveals mild abdominal distention but is otherwise unremarkable.  KUB shows a large stool ball with gaseous distention of the small and large bowels that is increased from previous hospital admission.  CT  abdomen pelvis shows a large stool burden with very large stool balls in the rectum measuring at least 12.2 cm.  Soapsuds enemas ordered.  Laboratory evaluation largely unremarkable.  Due to the significant stool burden in the setting of Parkinson's disease, patient require hospital admission for bowel cleanout.  Patient admitted.   Additional history obtained: -Additional history obtained from multiple family members -External records from outside source obtained and reviewed including: Chart review including previous notes, labs, imaging, consultation notes   Lab Tests: -I ordered, reviewed, and interpreted labs.   The pertinent results include:   Labs Reviewed  URINE CULTURE  CBC WITH DIFFERENTIAL/PLATELET  COMPREHENSIVE METABOLIC PANEL  URINALYSIS, ROUTINE W REFLEX MICROSCOPIC  CK      Imaging Studies ordered: I ordered imaging studies including KUB, CTAP I independently visualized and interpreted imaging. I agree with the radiologist interpretation   Medicines ordered and prescription drug management: Meds ordered this encounter  Medications   iohexol (OMNIPAQUE) 300 MG/ML solution 100 mL    -I have reviewed the patients home medicines and have made adjustments as needed  Critical interventions none  Cardiac Monitoring: The patient was maintained on a cardiac monitor.  I personally viewed and interpreted the cardiac monitored which showed an underlying rhythm of: NSR  Social Determinants of Health:  Factors impacting patients care include:  none   Reevaluation: After the interventions noted above, I reevaluated the patient and found that they have :stayed the same  Co morbidities that complicate the patient evaluation  Past Medical History:  Diagnosis Date   Arthritis    Constipation    OCCASIONAL   Coronary artery disease    Diabetes mellitus without complication ([place])    Environmental allergies    Hyperlipidemia    Hypertension    Inguinal hernia    Iron deficiency    Nocturia    Parkinson disease ([place])    Peripheral arterial disease ([place])       Dispostion: I considered admission for this patient, and due to his significant stool burden requiring multiday bowel cleanout, patient will require hospital admission     Final Clinical Impression(s) / ED Diagnoses Final diagnoses:  None     '@PCDICTATION'$ @    Lorianna Spadaccini, Debe Coder, MD 11/02/21 1501

## 2021-11-02 NOTE — Progress Notes (Signed)
   11/02/21 1626  Assess: MEWS Score  Temp 97.9 F (36.6 C)  BP 117/64  MAP (mmHg) 82  Pulse Rate (!) 104  Resp 12  Level of Consciousness Alert  SpO2 97 %  O2 Device Room Air  Assess: MEWS Score  MEWS Temp 0  MEWS Systolic 0  MEWS Pulse 1  MEWS RR 1  MEWS LOC 0  MEWS Score 2  MEWS Score Color Yellow  Assess: if the MEWS score is Yellow or Red  Were vital signs taken at a resting state? Yes  Focused Assessment No change from prior assessment  Does the patient meet 2 or more of the SIRS criteria? No  MEWS guidelines implemented *See Row Information* Yes  Treat  MEWS Interventions Escalated (See documentation below)  Pain Scale 0-10  Pain Score 0  Take Vital Signs  Increase Vital Sign Frequency  Yellow: Q 2hr X 2 then Q 4hr X 2, if remains yellow, continue Q 4hrs  Escalate  MEWS: Escalate Yellow: discuss with charge nurse/RN and consider discussing with provider and RRT  Notify: Charge Nurse/RN  Name of Charge Nurse/RN Notified Sonnie, RN  Date Charge Nurse/RN Notified 11/02/21  Time Charge Nurse/RN Notified 1800  Document  Patient Outcome Stabilized after interventions  Progress note created (see row info) Yes  Assess: SIRS CRITERIA  SIRS Temperature  0  SIRS Pulse 1  SIRS Respirations  0  SIRS WBC 1  SIRS Score Sum  2

## 2021-11-03 ENCOUNTER — Observation Stay (HOSPITAL_COMMUNITY): Payer: Medicare Other

## 2021-11-03 ENCOUNTER — Other Ambulatory Visit: Payer: Self-pay

## 2021-11-03 DIAGNOSIS — G20 Parkinson's disease: Secondary | ICD-10-CM | POA: Diagnosis not present

## 2021-11-03 DIAGNOSIS — R338 Other retention of urine: Secondary | ICD-10-CM

## 2021-11-03 DIAGNOSIS — K5900 Constipation, unspecified: Secondary | ICD-10-CM | POA: Diagnosis not present

## 2021-11-03 LAB — COMPREHENSIVE METABOLIC PANEL
ALT: 6 U/L (ref 0–44)
AST: 13 U/L — ABNORMAL LOW (ref 15–41)
Albumin: 3.3 g/dL — ABNORMAL LOW (ref 3.5–5.0)
Alkaline Phosphatase: 86 U/L (ref 38–126)
Anion gap: 9 (ref 5–15)
BUN: 17 mg/dL (ref 8–23)
CO2: 26 mmol/L (ref 22–32)
Calcium: 9.1 mg/dL (ref 8.9–10.3)
Chloride: 104 mmol/L (ref 98–111)
Creatinine, Ser: 0.61 mg/dL (ref 0.61–1.24)
GFR, Estimated: >60 mL/min (ref >60)
Glucose, Bld: 114 mg/dL — ABNORMAL HIGH (ref 70–99)
Potassium: 3.4 mmol/L — ABNORMAL LOW (ref 3.5–5.1)
Sodium: 139 mmol/L (ref 135–145)
Total Bilirubin: 1 mg/dL (ref 0.3–1.2)
Total Protein: 7.1 g/dL (ref 6.5–8.1)

## 2021-11-03 LAB — CBC
HCT: 38.1 % — ABNORMAL LOW (ref 39.0–52.0)
Hemoglobin: 12.6 g/dL — ABNORMAL LOW (ref 13.0–17.0)
MCH: 31.9 pg (ref 26.0–34.0)
MCHC: 33.1 g/dL (ref 30.0–36.0)
MCV: 96.5 fL (ref 80.0–100.0)
Platelets: 265 10*3/uL (ref 150–400)
RBC: 3.95 MIL/uL — ABNORMAL LOW (ref 4.22–5.81)
RDW: 14.1 % (ref 11.5–15.5)
WBC: 7.9 10*3/uL (ref 4.0–10.5)
nRBC: 0 % (ref 0.0–0.2)

## 2021-11-03 LAB — MAGNESIUM: Magnesium: 2.1 mg/dL (ref 1.7–2.4)

## 2021-11-03 LAB — TSH: TSH: 4.185 u[IU]/mL (ref 0.350–4.500)

## 2021-11-03 LAB — T4, FREE: Free T4: 0.87 ng/dL (ref 0.61–1.12)

## 2021-11-03 MED ORDER — POTASSIUM CHLORIDE 20 MEQ PO PACK
40.0000 meq | PACK | Freq: Once | ORAL | Status: AC
  Administered 2021-11-03: 40 meq via ORAL
  Filled 2021-11-03: qty 2

## 2021-11-03 NOTE — Progress Notes (Signed)
[facility] PROGRESS NOTE   LUNDY COZART QIO:[id number] DOB: Sep 13, 1945 DOA: 11/02/2021  PCP: Lujean Amel, MD  Brief History/Interval Summary:  76 y.o. male with a past medical history of Parkinson's disease, bedbound status, recent urinary retention with Foley catheter, essential hypertension, coronary artery disease, peripheral artery disease, constipation who was recently hospitalized for acute urinary retention.  Found to have a urinary tract infection.  Was treated with antibiotics.  A Foley catheter was placed.  Started on Flomax.  Patient has an appointment with urology next week for voiding trial.  At that hospitalization patient also had findings suggestive of aspiration pneumonia.  He was stabilized and then discharged home.  He was found to have fecal impaction during previous hospitalization which was treated with MiraLAX and Senokot and enemas. Patient was sent over to the emergency department today due to concern that he has not had a bowel movement in almost a week.  Abdominal films and CT scan showed severe constipation with evidence of bowel distention as a result.  Patient was hospitalized for further management.   Consultants: None  Procedures: None    Subjective/Interval History: Patient is somnolent this morning.  Wife is at the bedside.  No overnight issues reported.    Assessment/Plan:   Severe constipation with concern for ileus This is all likely triggered by his immobility, Parkinson's disease.  Thyroid function testing noted to be normal.  Not on any narcotic agents.  His dysphagia and poor oral intake and inability to have thin liquids also contributing. Patient was given enema in the ED with reasonably good response.  We will give more enemas today.  He remains on MiraLAX and Senokot.  Lactulose was added yesterday.  We will repeat a abdominal film to see if there is any improvement in the appearance of the bowels.  Will likely need to have more  bowel movements before he can be safely discharged.  Otherwise there is high risk of rehospitalization.  Parkinson's disease Continue with Sinemet.  Patient is bedbound at baseline.  Recent urinary retention Foley catheter was placed during previous hospitalization.  Remains on Flomax.  According to patient's family he has an appointment next week with urology.  Essential hypertension Has a history of same but no antihypertensives are noted on his medication list.  Monitor blood pressures closely.  Hypokalemia Will be repleted.  Magnesium noted to be 2.1.  Sacral decubitus/deep tissue pressure injury, present on admission Wound care nurse to evaluate.  Goals of care Patient was seen by palliative care during previous hospitalization.  Full code for now.   DVT Prophylaxis: Lovenox Code Status: Full code Family Communication: Discussed with patient's wife Disposition Plan: Hopefully return home in 24 to 48 hours  Status is: Observation The patient will require care spanning > 2 midnights and should be moved to inpatient because: Still quite constipated.  Will need more aggressive bowel regimen prior to discharge.      Medications: Scheduled:  aspirin EC  81 mg Oral Daily   atorvastatin  10 mg Oral Daily   carbidopa-levodopa  1 tablet Oral QHS   carbidopa-levodopa  1 tablet Oral QID   enoxaparin (LOVENOX) injection  40 mg Subcutaneous Q24H   lactulose  30 g Oral BID   polyethylene glycol  17 g Oral TID   QUEtiapine  12.5 mg Oral QHS   senna  2 tablet Oral BID   tamsulosin  0.4 mg Oral QPC supper   Continuous: LKG:[id number] **OR** acetaminophen, albuterol, ondansetron **OR**  ondansetron (ZOFRAN) IV, tiZANidine  Antibiotics: Anti-infectives (From admission, onward)    None       Objective:  Vital Signs  Vitals:   11/02/21 1820 11/02/21 2226 11/03/21 0327 11/03/21 0728  BP: 128/79 105/76 102/65 105/72  Pulse: 88 98 83 76  Resp: '15 14 14 14  '$ Temp: 98.7 F  (37.1 C) 98.4 F (36.9 C) (!) 97.5 F (36.4 C) 98.5 F (36.9 C)  TempSrc: Oral Oral Oral Axillary  SpO2: 99% 92% 97% 97%  Weight:      Height:        Intake/Output Summary (Last 24 hours) at 11/03/2021 0916 Last data filed at 11/02/2021 1815 Gross per 24 hour  Intake --  Output 300 ml  Net -300 ml   Filed Weights   11/02/21 1322  Weight: 58 kg    General appearance: Somnolent.  Opens his eyes when name is called. Resp: Clear to auscultation bilaterally.  Normal effort Cardio: S1-S2 is normal regular.  No S3-S4.  No rubs murmurs or bruit GI: Abdomen is soft.  Nontender nondistended.  Bowel sounds are present normal.  No masses organomegaly Extremities: Mild edema bilateral lower extremities   Lab Results:  Data Reviewed: I have personally reviewed following labs and reports of the imaging studies  CBC: Recent Labs  Lab 11/02/21 1325 11/03/21 0534  WBC 6.9 7.9  NEUTROABS 5.0  --   HGB 13.5 12.6*  HCT 41.3 38.1*  MCV 96.3 96.5  PLT 272 924    Basic Metabolic Panel: Recent Labs  Lab 11/02/21 1325 11/03/21 0534 11/03/21 0715  NA 135 139  --   K 3.8 3.4*  --   CL 104 104  --   CO2 25 26  --   GLUCOSE 130* 114*  --   BUN 16 17  --   CREATININE 0.44* 0.61  --   CALCIUM 9.0 9.1  --   MG  --   --  2.1    GFR: Estimated Creatinine Clearance: 62.4 mL/min (by C-G formula based on SCr of 0.61 mg/dL).  Liver Function Tests: Recent Labs  Lab 11/02/21 1325 11/03/21 0534  AST 14* 13*  ALT <5 6  ALKPHOS 85 86  BILITOT 1.1 1.0  PROT 7.4 7.1  ALBUMIN 3.4* 3.3*      Cardiac Enzymes: Recent Labs  Lab 11/02/21 1325  CKTOTAL 21*     Thyroid Function Tests: Recent Labs    11/03/21 0534  TSH 4.185  FREET4 0.87    Radiology Studies: CT ABDOMEN PELVIS W CONTRAST  Result Date: 11/02/2021 CLINICAL DATA:  Bowel obstruction suspected, no bowel movement EXAM: CT ABDOMEN AND PELVIS WITH CONTRAST TECHNIQUE: Multidetector CT imaging of the abdomen and pelvis  was performed using the standard protocol following bolus administration of intravenous contrast. RADIATION DOSE REDUCTION: This exam was performed according to the departmental dose-optimization program which includes automated exposure control, adjustment of the mA and/or kV according to patient size and/or use of iterative reconstruction technique. CONTRAST:  165m OMNIPAQUE IOHEXOL 300 MG/ML  SOLN COMPARISON:  10/14/2021 FINDINGS: Lower chest: No acute abnormality. Coronary artery calcifications. Scarring of the right lung base. Hepatobiliary: No solid liver abnormality is seen. No gallstones, gallbladder wall thickening, or biliary dilatation. Pancreas: Unremarkable. No pancreatic ductal dilatation or surrounding inflammatory changes. Spleen: Normal in size without significant abnormality. Adrenals/Urinary Tract: Adrenal glands are unremarkable. Small bilateral nonobstructive calculi and or renal vascular calcifications. No hydronephrosis. Foley catheter in the urinary bladder. Stomach/Bowel: Stomach is within normal  limits. Appendix appears normal. No evidence of bowel wall thickening, distention, or inflammatory changes. Small bowel is diffusely gas-filled although nondistended. Large burden of stool throughout the colon including very large stool balls in the rectum, measuring at least 12.2 cm (series 6, image 85). Vascular/Lymphatic: Aortic atherosclerosis. No enlarged abdominal or pelvic lymph nodes. Reproductive: No mass or other significant abnormality. Other: No abdominal wall hernia or abnormality. No ascites. Musculoskeletal: No acute or significant osseous findings. Status post right hip total arthroplasty. IMPRESSION: 1. Large burden of stool throughout the colon including very large stool balls in the rectum, measuring at least 12.2 cm. Correlate for fecal impaction. 2. Small bilateral nonobstructive calculi and or renal vascular calcifications. No hydronephrosis. 3. Coronary artery disease. Aortic  Atherosclerosis (ICD10-I70.0). Electronically Signed   By: Delanna Ahmadi M.D.   On: 11/02/2021 14:39   DG Abdomen 1 View  Result Date: 11/02/2021 CLINICAL DATA:  r/o obstruction.  Abdominal pain and distention. EXAM: ABDOMEN - 1 VIEW 2 images COMPARISON:  October 17, 2021 FINDINGS: There is moderate gaseous distention of the small and large bowels seen and has mildly increased in the interim. Large stool burden in the rectum. 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 Right hip prosthesis, stable. IMPRESSION: Mild gaseous distention of the small and large bowels and has increased in the interim which may be due to ileus. Early bowel obstruction cannot be entirely excluded. Large stool burden in the rectum. Electronically Signed   By: Frazier Richards M.D.   On: 11/02/2021 14:24       LOS: 0 days   [facility] Pager on [email or web address]  11/03/2021, 9:16 AM

## 2021-11-04 DIAGNOSIS — L89156 Pressure-induced deep tissue damage of sacral region: Secondary | ICD-10-CM | POA: Diagnosis present

## 2021-11-04 DIAGNOSIS — R1312 Dysphagia, oropharyngeal phase: Secondary | ICD-10-CM | POA: Diagnosis present

## 2021-11-04 DIAGNOSIS — Z7401 Bed confinement status: Secondary | ICD-10-CM | POA: Diagnosis not present

## 2021-11-04 DIAGNOSIS — M1990 Unspecified osteoarthritis, unspecified site: Secondary | ICD-10-CM | POA: Diagnosis present

## 2021-11-04 DIAGNOSIS — Z87891 Personal history of nicotine dependence: Secondary | ICD-10-CM | POA: Diagnosis not present

## 2021-11-04 DIAGNOSIS — E876 Hypokalemia: Secondary | ICD-10-CM | POA: Diagnosis not present

## 2021-11-04 DIAGNOSIS — R339 Retention of urine, unspecified: Secondary | ICD-10-CM | POA: Diagnosis present

## 2021-11-04 DIAGNOSIS — Z7982 Long term (current) use of aspirin: Secondary | ICD-10-CM | POA: Diagnosis not present

## 2021-11-04 DIAGNOSIS — Z9842 Cataract extraction status, left eye: Secondary | ICD-10-CM | POA: Diagnosis not present

## 2021-11-04 DIAGNOSIS — Z79899 Other long term (current) drug therapy: Secondary | ICD-10-CM | POA: Diagnosis not present

## 2021-11-04 DIAGNOSIS — E1151 Type 2 diabetes mellitus with diabetic peripheral angiopathy without gangrene: Secondary | ICD-10-CM | POA: Diagnosis present

## 2021-11-04 DIAGNOSIS — K5900 Constipation, unspecified: Secondary | ICD-10-CM | POA: Diagnosis not present

## 2021-11-04 DIAGNOSIS — K5909 Other constipation: Secondary | ICD-10-CM | POA: Diagnosis present

## 2021-11-04 DIAGNOSIS — I2510 Atherosclerotic heart disease of native coronary artery without angina pectoris: Secondary | ICD-10-CM | POA: Diagnosis present

## 2021-11-04 DIAGNOSIS — Z681 Body mass index (BMI) 19 or less, adult: Secondary | ICD-10-CM | POA: Diagnosis not present

## 2021-11-04 DIAGNOSIS — G20 Parkinson's disease: Secondary | ICD-10-CM | POA: Diagnosis present

## 2021-11-04 DIAGNOSIS — I10 Essential (primary) hypertension: Secondary | ICD-10-CM | POA: Diagnosis present

## 2021-11-04 DIAGNOSIS — E43 Unspecified severe protein-calorie malnutrition: Secondary | ICD-10-CM | POA: Diagnosis present

## 2021-11-04 DIAGNOSIS — K5901 Slow transit constipation: Secondary | ICD-10-CM | POA: Diagnosis present

## 2021-11-04 DIAGNOSIS — F0280 Dementia in other diseases classified elsewhere without behavioral disturbance: Secondary | ICD-10-CM | POA: Diagnosis present

## 2021-11-04 DIAGNOSIS — R338 Other retention of urine: Secondary | ICD-10-CM | POA: Diagnosis not present

## 2021-11-04 DIAGNOSIS — Z96641 Presence of right artificial hip joint: Secondary | ICD-10-CM | POA: Diagnosis present

## 2021-11-04 DIAGNOSIS — E7800 Pure hypercholesterolemia, unspecified: Secondary | ICD-10-CM | POA: Diagnosis present

## 2021-11-04 DIAGNOSIS — E039 Hypothyroidism, unspecified: Secondary | ICD-10-CM | POA: Diagnosis present

## 2021-11-04 DIAGNOSIS — E785 Hyperlipidemia, unspecified: Secondary | ICD-10-CM | POA: Diagnosis present

## 2021-11-04 DIAGNOSIS — Z833 Family history of diabetes mellitus: Secondary | ICD-10-CM | POA: Diagnosis not present

## 2021-11-04 MED ORDER — FLEET ENEMA 7-19 GM/118ML RE ENEM
1.0000 | ENEMA | Freq: Once | RECTAL | Status: AC
Start: 2021-11-04 — End: 2021-11-04
  Administered 2021-11-04: 1 via RECTAL
  Filled 2021-11-04: qty 1

## 2021-11-04 MED ORDER — POTASSIUM CHLORIDE 20 MEQ PO PACK
40.0000 meq | PACK | Freq: Once | ORAL | Status: AC
Start: 2021-11-04 — End: 2021-11-04
  Administered 2021-11-04: 40 meq via ORAL
  Filled 2021-11-04: qty 2

## 2021-11-04 MED ORDER — MAGNESIUM OXIDE -MG SUPPLEMENT 400 (240 MG) MG PO TABS
400.0000 mg | ORAL_TABLET | Freq: Every day | ORAL | Status: DC
Start: 2021-11-04 — End: 2021-11-05
  Administered 2021-11-04 – 2021-11-05 (×2): 400 mg via ORAL
  Filled 2021-11-04 (×2): qty 1

## 2021-11-04 NOTE — Progress Notes (Signed)
[facility] PROGRESS NOTE   Rodney Love LFY:[id number] DOB: 14-Nov-1945 DOA: 11/02/2021  PCP: Lujean Amel, MD  Brief History/Interval Summary:  76 y.o. male with a past medical history of Parkinson's disease, bedbound status, recent urinary retention with Foley catheter, essential hypertension, coronary artery disease, peripheral artery disease, constipation who was recently hospitalized for acute urinary retention.  Found to have a urinary tract infection.  Was treated with antibiotics.  A Foley catheter was placed.  Started on Flomax.  Patient has an appointment with urology next week for voiding trial.  At that hospitalization patient also had findings suggestive of aspiration pneumonia.  He was stabilized and then discharged home.  He was found to have fecal impaction during previous hospitalization which was treated with MiraLAX and Senokot and enemas. Patient was sent over to the emergency department today due to concern that he has not had a bowel movement in almost a week.  Abdominal films and CT scan showed severe constipation with evidence of bowel distention as a result.  Patient was hospitalized for further management.   Consultants: None  Procedures: None    Subjective/Interval History: Patient somnolent but arousable.  Wife is at the bedside.  No issues reported overnight.  Wife mentioned that he did something to eat and drink yesterday though nothing has been charted.    Assessment/Plan:   Severe constipation with concern for ileus This is all likely triggered by his immobility, Parkinson's disease.  Thyroid function testing noted to be normal.  Not on any narcotic agents.  His dysphagia and poor oral intake and inability to have thin liquids also contributing. Patient given enemas and suppositories with good results.  Continued on MiraLAX and Senokot.  Lactulose was added.  Abdominal film shows stable findings.  Abdomen remains benign on examination. Continue  current treatment for now. Has been seen by gastroenterology previously, [facility].  Has had colonoscopies previously.  Parkinson's disease Continue with Sinemet.  Patient is bedbound at baseline.  Recent urinary retention Foley catheter was placed during previous hospitalization.  Remains on Flomax.  According to patient's family he has an appointment next week with urology.  Essential hypertension Has a history of same but no antihypertensives are noted on his medication list.  Monitor blood pressures closely.  Blood Pressure noted to be stable.  Hypokalemia Was repleted yesterday.  Magnesium noted to be 2.1.  Will recheck labs tomorrow.  Sacral decubitus/deep tissue pressure injury, present on admission Wound care nurse to evaluate.  Goals of care Patient was seen by palliative care during previous hospitalization.  Full code for now.   DVT Prophylaxis: Lovenox Code Status: Full code Family Communication: Discussed with patient's wife Disposition Plan: Patient's wife mentioned that she has no help at home today.  Unable to take him home.  We will plan for discharge tomorrow.  Status is: Observation The patient will require care spanning > 2 midnights and should be moved to inpatient because: Still quite constipated.  Will need more aggressive bowel regimen prior to discharge.      Medications: Scheduled:  aspirin EC  81 mg Oral Daily   atorvastatin  10 mg Oral Daily   carbidopa-levodopa  1 tablet Oral QHS   carbidopa-levodopa  1 tablet Oral QID   enoxaparin (LOVENOX) injection  40 mg Subcutaneous Q24H   lactulose  30 g Oral BID   polyethylene glycol  17 g Oral TID   QUEtiapine  12.5 mg Oral QHS   senna  2 tablet Oral BID  tamsulosin  0.4 mg Oral QPC supper   Continuous: [id number] **OR** acetaminophen, albuterol, ondansetron **OR** ondansetron (ZOFRAN) IV, tiZANidine  Antibiotics: Anti-infectives (From admission, onward)    None        Objective:  Vital Signs  Vitals:   11/03/21 0728 11/03/21 1332 11/03/21 2045 11/04/21 0522  BP: 105/72 112/69 (!) 140/62 132/73  Pulse: 76 64 78 74  Resp: '14 14 18 18  '$ Temp: 98.5 F (36.9 C) 98.5 F (36.9 C) 98.3 F (36.8 C) 98.4 F (36.9 C)  TempSrc: Axillary Axillary Oral Oral  SpO2: 97% 99% 98% 100%  Weight:      Height:        Intake/Output Summary (Last 24 hours) at 11/04/2021 0922 Last data filed at 11/04/2021 0600 Gross per 24 hour  Intake 0 ml  Output 1075 ml  Net -1075 ml    Filed Weights   11/02/21 1322  Weight: 58 kg    General appearance: Somnolent but easily arousable. Resp: Clear to auscultation bilaterally.  Normal effort Cardio: S1-S2 is normal regular.  No S3-S4.  No rubs murmurs or bruit GI: Abdomen is soft.  Nontender nondistended.  Bowel sounds are present normal.  No masses organomegaly    Lab Results:  Data Reviewed: I have personally reviewed following labs and reports of the imaging studies  CBC: Recent Labs  Lab 11/02/21 1325 11/03/21 0534  WBC 6.9 7.9  NEUTROABS 5.0  --   HGB 13.5 12.6*  HCT 41.3 38.1*  MCV 96.3 96.5  PLT 272 265     Basic Metabolic Panel: Recent Labs  Lab 11/02/21 1325 11/03/21 0534 11/03/21 0715  NA 135 139  --   K 3.8 3.4*  --   CL 104 104  --   CO2 25 26  --   GLUCOSE 130* 114*  --   BUN 16 17  --   CREATININE 0.44* 0.61  --   CALCIUM 9.0 9.1  --   MG  --   --  2.1     GFR: Estimated Creatinine Clearance: 62.4 mL/min (by C-G formula based on SCr of 0.61 mg/dL).  Liver Function Tests: Recent Labs  Lab 11/02/21 1325 11/03/21 0534  AST 14* 13*  ALT <5 6  ALKPHOS 85 86  BILITOT 1.1 1.0  PROT 7.4 7.1  ALBUMIN 3.4* 3.3*       Cardiac Enzymes: Recent Labs  Lab 11/02/21 1325  CKTOTAL 21*      Thyroid Function Tests: Recent Labs    11/03/21 0534  TSH 4.185  FREET4 0.87     Radiology Studies: DG Abd Portable 1V  Result Date: 11/03/2021 CLINICAL DATA:  Constipation  EXAM: PORTABLE ABDOMEN - 1 VIEW COMPARISON:  CT scan November 02, 2021.  KUB November 02, 2021. FINDINGS: Moderate to severe fecal loading throughout the colon. Gaseous distension of large and small bowel stable. No other acute abnormalities. IMPRESSION: 1. Gaseous distension of large and small bowel is likely due to ileus. Moderate to severe fecal loading throughout the colon. Electronically Signed   By: Dorise Bullion III M.D.   On: 11/03/2021 10:14   CT ABDOMEN PELVIS W CONTRAST  Result Date: 11/02/2021 CLINICAL DATA:  Bowel obstruction suspected, no bowel movement EXAM: CT ABDOMEN AND PELVIS WITH CONTRAST TECHNIQUE: Multidetector CT imaging of the abdomen and pelvis was performed using the standard protocol following bolus administration of intravenous contrast. RADIATION DOSE REDUCTION: This exam was performed according to the departmental dose-optimization program which includes automated exposure control,  adjustment of the mA and/or kV according to patient size and/or use of iterative reconstruction technique. CONTRAST:  144m OMNIPAQUE IOHEXOL 300 MG/ML  SOLN COMPARISON:  10/14/2021 FINDINGS: Lower chest: No acute abnormality. Coronary artery calcifications. Scarring of the right lung base. Hepatobiliary: No solid liver abnormality is seen. No gallstones, gallbladder wall thickening, or biliary dilatation. Pancreas: Unremarkable. No pancreatic ductal dilatation or surrounding inflammatory changes. Spleen: Normal in size without significant abnormality. Adrenals/Urinary Tract: Adrenal glands are unremarkable. Small bilateral nonobstructive calculi and or renal vascular calcifications. No hydronephrosis. Foley catheter in the urinary bladder. Stomach/Bowel: Stomach is within normal limits. Appendix appears normal. No evidence of bowel wall thickening, distention, or inflammatory changes. Small bowel is diffusely gas-filled although nondistended. Large burden of stool throughout the colon including very large  stool balls in the rectum, measuring at least 12.2 cm (series 6, image 85). Vascular/Lymphatic: Aortic atherosclerosis. No enlarged abdominal or pelvic lymph nodes. Reproductive: No mass or other significant abnormality. Other: No abdominal wall hernia or abnormality. No ascites. Musculoskeletal: No acute or significant osseous findings. Status post right hip total arthroplasty. IMPRESSION: 1. Large burden of stool throughout the colon including very large stool balls in the rectum, measuring at least 12.2 cm. Correlate for fecal impaction. 2. Small bilateral nonobstructive calculi and or renal vascular calcifications. No hydronephrosis. 3. Coronary artery disease. Aortic Atherosclerosis (ICD10-I70.0). Electronically Signed   By: ADelanna AhmadiM.D.   On: 11/02/2021 14:39   DG Abdomen 1 View  Result Date: 11/02/2021 CLINICAL DATA:  r/o obstruction.  Abdominal pain and distention. EXAM: ABDOMEN - 1 VIEW 2 images COMPARISON:  October 17, 2021 FINDINGS: There is moderate gaseous distention of the small and large bowels seen and has mildly increased in the interim. Large stool burden in the rectum. 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 Right hip prosthesis, stable. IMPRESSION: Mild gaseous distention of the small and large bowels and has increased in the interim which may be due to ileus. Early bowel obstruction cannot be entirely excluded. Large stool burden in the rectum. Electronically Signed   By: AFrazier RichardsM.D.   On: 11/02/2021 14:24       LOS: 0 days   [facility] Pager on [email or web address]  11/04/2021, 9:22 AM

## 2021-11-05 LAB — BASIC METABOLIC PANEL
Anion gap: 5 (ref 5–15)
BUN: 18 mg/dL (ref 8–23)
CO2: 26 mmol/L (ref 22–32)
Calcium: 8.9 mg/dL (ref 8.9–10.3)
Chloride: 108 mmol/L (ref 98–111)
Creatinine, Ser: 0.61 mg/dL (ref 0.61–1.24)
GFR, Estimated: >60 mL/min (ref >60)
Glucose, Bld: 125 mg/dL — ABNORMAL HIGH (ref 70–99)
Potassium: 4.1 mmol/L (ref 3.5–5.1)
Sodium: 139 mmol/L (ref 135–145)

## 2021-11-05 MED ORDER — CHLORHEXIDINE GLUCONATE CLOTH 2 % EX PADS: 6.0000 | MEDICATED_PAD | Freq: Every day | CUTANEOUS | Status: DC

## 2021-11-05 MED ORDER — LACTULOSE 10 GM/15ML PO SOLN: 30.0000 g | Freq: Two times a day (BID) | ORAL | 1 refills | Status: DC

## 2021-11-05 MED ORDER — POLYETHYLENE GLYCOL 3350 17 G PO PACK: 17.0000 g | PACK | Freq: Three times a day (TID) | ORAL | 1 refills | Status: DC

## 2021-11-05 MED ORDER — SENNA 8.6 MG PO TABS: 2.0000 | ORAL_TABLET | Freq: Two times a day (BID) | ORAL | 0 refills | Status: DC

## 2021-11-05 MED ORDER — MAGNESIUM OXIDE -MG SUPPLEMENT 400 (240 MG) MG PO TABS: 400.0000 mg | ORAL_TABLET | Freq: Every day | ORAL | 2 refills | Status: DC

## 2021-11-05 NOTE — Discharge Summary (Signed)
[facility]  Physician Discharge Summary   Patient ID: TALVIN CHRISTIANSON MRN: [id number] DOB/AGE: Jun 03, 1945 76 y.o.  Admit date: 11/02/2021 Discharge date: 11/05/2021    PCP: Lujean Amel, MD  DISCHARGE DIAGNOSES:  Principal Problem:   Constipation Active Problems:   Essential hypertension   PD (Parkinson's disease) ([place])   Acute urinary retention   RECOMMENDATIONS FOR OUTPATIENT FOLLOW UP: Patient's family was told to contact [facility] gastroenterology if there are further issues with constipation.   Home Health: Resume home health Equipment/Devices: None  CODE STATUS: Full code  DISCHARGE CONDITION: fair  Diet recommendation: As before  INITIAL HISTORY: 76 y.o. male with a past medical history of Parkinson's disease, bedbound status, recent urinary retention with Foley catheter, essential hypertension, coronary artery disease, peripheral artery disease, constipation who was recently hospitalized for acute urinary retention.  Found to have a urinary tract infection.  Was treated with antibiotics.  A Foley catheter was placed.  Started on Flomax.  Patient has an appointment with urology next week for voiding trial.  At that hospitalization patient also had findings suggestive of aspiration pneumonia.  He was stabilized and then discharged home.  He was found to have fecal impaction during previous hospitalization which was treated with MiraLAX and Senokot and enemas. Patient was sent over to the emergency department today due to concern that he has not had a bowel movement in almost a week.  Abdominal films and CT scan showed severe constipation with evidence of bowel distention as a result.  Patient was hospitalized for further management.    Consultants: Phone discussion with [facility] gastroenterology   Procedures: None   HOSPITAL COURSE:   Severe constipation with concern for ileus This is all likely triggered by his immobility, Parkinson's disease.  Thyroid function  testing noted to be normal.  Not on any narcotic agents.  His dysphagia and poor oral intake and inability to have thin liquids also contributing. Patient given enemas and suppositories with good results.  Continued on MiraLAX and Senokot.  Lactulose was added.  Abdominal film shows stable findings.  Abdominal exam was benign.  Patient has had multiple bowel movements in the last 2 days.   Patient has been seen by gastroenterology in the past and has had colonoscopies previously.   Discussed briefly with Dr. Alessandra Bevels who agrees with continuing current bowel regimen.  There could be a role for Linzess if he has recurrence of constipation but this can be discussed in the outpatient setting.    Parkinson's disease Continue with Sinemet.  Patient is bedbound at baseline.  Recent urinary retention Foley catheter was placed during previous hospitalization.  Remains on Flomax.  According to patient's family he has an appointment next week with urology.   Essential hypertension Has a history of same but no antihypertensives are noted on his medication list. Blood Pressure noted to be stable.   Hypokalemia Repleted  Sacral decubitus/deep tissue pressure injury, present on admission  Goals of care Patient was seen by palliative care during previous hospitalization.  Full code for now.  Patient is stable.  Discussed with his wife.  Okay for discharge home today.   PERTINENT LABS:  The results of significant diagnostics from this hospitalization (including imaging, microbiology, ancillary and laboratory) are listed below for reference.     Labs:   Basic Metabolic Panel: Recent Labs  Lab 11/02/21 1325 11/03/21 0534 11/03/21 0715 11/05/21 0432  NA 135 139  --  139  K 3.8 3.4*  --  4.1  CL 104 104  --  108  CO2 25 26  --  26  GLUCOSE 130* 114*  --  125*  BUN 16 17  --  18  CREATININE 0.44* 0.61  --  0.61  CALCIUM 9.0 9.1  --  8.9  MG  --   --  2.1  --    Liver Function  Tests: Recent Labs  Lab 11/02/21 1325 11/03/21 0534  AST 14* 13*  ALT <5 6  ALKPHOS 85 86  BILITOT 1.1 1.0  PROT 7.4 7.1  ALBUMIN 3.4* 3.3*    CBC: Recent Labs  Lab 11/02/21 1325 11/03/21 0534  WBC 6.9 7.9  NEUTROABS 5.0  --   HGB 13.5 12.6*  HCT 41.3 38.1*  MCV 96.3 96.5  PLT 272 265   Cardiac Enzymes: Recent Labs  Lab 11/02/21 1325  CKTOTAL 21*     IMAGING STUDIES DG Abd Portable 1V  Result Date: 11/03/2021 CLINICAL DATA:  Constipation EXAM: PORTABLE ABDOMEN - 1 VIEW COMPARISON:  CT scan November 02, 2021.  KUB November 02, 2021. FINDINGS: Moderate to severe fecal loading throughout the colon. Gaseous distension of large and small bowel stable. No other acute abnormalities. IMPRESSION: 1. Gaseous distension of large and small bowel is likely due to ileus. Moderate to severe fecal loading throughout the colon. Electronically Signed   By: Dorise Bullion III M.D.   On: 11/03/2021 10:14   CT ABDOMEN PELVIS W CONTRAST  Result Date: 11/02/2021 CLINICAL DATA:  Bowel obstruction suspected, no bowel movement EXAM: CT ABDOMEN AND PELVIS WITH CONTRAST TECHNIQUE: Multidetector CT imaging of the abdomen and pelvis was performed using the standard protocol following bolus administration of intravenous contrast. RADIATION DOSE REDUCTION: This exam was performed according to the departmental dose-optimization program which includes automated exposure control, adjustment of the mA and/or kV according to patient size and/or use of iterative reconstruction technique. CONTRAST:  141m OMNIPAQUE IOHEXOL 300 MG/ML  SOLN COMPARISON:  10/14/2021 FINDINGS: Lower chest: No acute abnormality. Coronary artery calcifications. Scarring of the right lung base. Hepatobiliary: No solid liver abnormality is seen. No gallstones, gallbladder wall thickening, or biliary dilatation. Pancreas: Unremarkable. No pancreatic ductal dilatation or surrounding inflammatory changes. Spleen: Normal in size without significant  abnormality. Adrenals/Urinary Tract: Adrenal glands are unremarkable. Small bilateral nonobstructive calculi and or renal vascular calcifications. No hydronephrosis. Foley catheter in the urinary bladder. Stomach/Bowel: Stomach is within normal limits. Appendix appears normal. No evidence of bowel wall thickening, distention, or inflammatory changes. Small bowel is diffusely gas-filled although nondistended. Large burden of stool throughout the colon including very large stool balls in the rectum, measuring at least 12.2 cm (series 6, image 85). Vascular/Lymphatic: Aortic atherosclerosis. No enlarged abdominal or pelvic lymph nodes. Reproductive: No mass or other significant abnormality. Other: No abdominal wall hernia or abnormality. No ascites. Musculoskeletal: No acute or significant osseous findings. Status post right hip total arthroplasty. IMPRESSION: 1. Large burden of stool throughout the colon including very large stool balls in the rectum, measuring at least 12.2 cm. Correlate for fecal impaction. 2. Small bilateral nonobstructive calculi and or renal vascular calcifications. No hydronephrosis. 3. Coronary artery disease. Aortic Atherosclerosis (ICD10-I70.0). Electronically Signed   By: ADelanna AhmadiM.D.   On: 11/02/2021 14:39   DG Abdomen 1 View  Result Date: 11/02/2021 CLINICAL DATA:  r/o obstruction.  Abdominal pain and distention. EXAM: ABDOMEN - 1 VIEW 2 images COMPARISON:  October 17, 2021 FINDINGS: There is moderate gaseous distention of the small and large bowels seen  and has mildly increased in the interim. Large stool burden in the rectum. 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 Right hip prosthesis, stable. IMPRESSION: Mild gaseous distention of the small and large bowels and has increased in the interim which may be due to ileus. Early bowel obstruction cannot be entirely excluded. Large stool burden in the rectum. Electronically Signed   By: Frazier Richards M.D.   On: 11/02/2021  14:24   DG Abd Portable 1V  Result Date: 10/17/2021 CLINICAL DATA:  Fecal impaction EXAM: PORTABLE ABDOMEN - 1 VIEW COMPARISON:  CT abdomen/pelvis dated 10/15/2018 FINDINGS: There is gaseous distention of the small bowel without evidence of mechanical obstruction. Enteric contrast is seen throughout the colon. There is a large stool burden projecting over the rectum. There is no definite free intraperitoneal air, within the confines of supine technique. There is scoliotic curvature and multilevel degenerative change of the lumbar spine. Right hip arthroplasty hardware is noted. The lung bases are clear. IMPRESSION: 1. Gaseous distention of the small bowel without evidence of mechanical obstruction. Enteric contrast seen throughout the colon. 2. Large stool burden in the rectum. Electronically Signed   By: Valetta Mole M.D.   On: 10/17/2021 16:03   DG Swallowing Func-Speech Pathology  Result Date: 10/15/2021 Table formatting from the original result was not included. Objective Swallowing Evaluation: Type of Study: MBS-Modified Barium Swallow Study  Patient Details Name: MICHAH MINTON MRN: [id number] Date of Birth: [birth year] Today's Date: 10/15/2021 Time: SLP Start Time (ACUTE ONLY): 9 -SLP Stop Time (ACUTE ONLY): 6301 SLP Time Calculation (min) (ACUTE ONLY): 16 min Past Medical History: Past Medical History: Diagnosis Date  Arthritis   Constipation   OCCASIONAL  Coronary artery disease   Diabetes mellitus without complication ([place])   Environmental allergies   Hyperlipidemia   Hypertension   Inguinal hernia   Iron deficiency   Nocturia   Parkinson disease ([place])   Peripheral arterial disease ([place])  Past Surgical History: Past Surgical History: Procedure Laterality Date  BACK SURGERY    CARDIAC CATHETERIZATION  05/29/2006  single vessel CAD w/moderate stenosis of the prox. RCA approx 40%,prox. LAD 20-30%,minor irreg. mid abdominal aorta,left iliac artery disease 50-60%  CATARACT EXTRACTION Left 12/2016  HERNIA  REPAIR  [id number]  INGUINAL HERNIA REPAIR N/A 09/23/2012  Procedure: LAPAROSCOPIC INGUINAL HERNIA with umbilical hernia;  Surgeon: Madilyn Hook, DO;  Location: [facility];  Service: General;  Laterality: N/A;  INSERTION OF MESH Bilateral 09/23/2012  Procedure: INSERTION OF MESH;  Surgeon: Madilyn Hook, DO;  Location: [facility];  Service: General;  Laterality: Bilateral;  TOTAL HIP ARTHROPLASTY Right 04/29/2020  Procedure: TOTAL HIP ARTHROPLASTY ANTERIOR APPROACH;  Surgeon: Rod Can, MD;  Location: [facility];  Service: Orthopedics;  Laterality: Right; HPI: GERREN HOFFMEIER is a 76 y.o. male with medical history significant of Parkinson's, type 2 diabetes, PAD, hypothyroidism and hyperlipidemia who presents with abdominal distention, bedbound and mostly non-verbal due to Parkinson's. per chart. Pt has chronic cough but has noticed that it has been worse in the past 2 days and productive. Found to have AKI, urinary retention, pneumonia and suspected ileus with full liquid diet (MD stated does not suspect he has an ileus).  No data recorded  Recommendations for follow up therapy are one component of a multi-disciplinary discharge planning process, led by the attending physician.  Recommendations may be updated based on patient status, additional functional criteria and insurance authorization. Assessment / Plan / Recommendation   10/15/2021   2:55 PM Clinical Impressions  Clinical Impression Pt presents with mild-moderate oropharyngeal dysphagia with silent aspiration of thin barium. Upper denture in place and lower plate in pt's room. There was intermittent prolonged propulsion with most consistencies with lingual residue at times. Pharyngeal phase marked by reduced tongue base retraction, decreased pharyngeal contraction and late laryngeal closure resulting in mild-max vallecular residue across textures. Thin barium entered vestibule prior to laryngeal closure and fell into trachea with only slight delayed and ineffective throat  clear. There was one instance of minimal penetration with nectar and given residual volume, honey thick not given. A chin tuck to decrease vallecular residue was effective. Pt is at higher opportunity for dysphagia given Parkinson's and is exacerbated with current illness. Results discussed with pt, wife and daughter who voiced understanding of recommendation of nectar thick liquids and puree texture given reduced endurance. SLP will follow and observe pt with higher food texture once lower denture donned once improvements seen in overall endurance. SLP Visit Diagnosis Dysphagia, oropharyngeal phase (R13.12) Impact on safety and function Moderate aspiration risk     10/15/2021   2:55 PM Treatment Recommendations Treatment Recommendations Therapy as outlined in treatment plan below     10/15/2021   2:55 PM Prognosis Prognosis for Safe Diet Advancement Good Barriers to Reach Goals --   10/15/2021   2:55 PM Diet Recommendations SLP Diet Recommendations Dysphagia 1 (Puree) solids;Nectar thick liquid Liquid Administration via Straw;Cup Medication Administration Crushed with puree Compensations Slow rate;Small sips/bites;Clear throat intermittently;Multiple dry swallows after each bite/sip Postural Changes Seated upright at 90 degrees     10/15/2021   2:55 PM Other Recommendations Oral Care Recommendations Oral care BID Follow Up Recommendations -- Assistance recommended at discharge Frequent or constant Supervision/Assistance Functional Status Assessment Patient has had a recent decline in their functional status and demonstrates the ability to make significant improvements in function in a reasonable and predictable amount of time.   10/15/2021   2:55 PM Frequency and Duration  Speech Therapy Frequency (ACUTE ONLY) min 2x/week Treatment Duration 2 weeks     10/15/2021   2:55 PM Oral Phase Oral Phase Impaired Oral - Nectar Cup Lingual/palatal residue;Delayed oral transit Oral - Nectar Straw Delayed oral transit;Lingual/palatal  residue Oral - Thin Cup Delayed oral transit Oral - Puree Lingual/palatal residue;Delayed oral transit Oral - Regular Delayed oral transit;Reduced posterior propulsion    10/15/2021   2:55 PM Pharyngeal Phase Pharyngeal Phase Impaired Pharyngeal- Nectar Cup Delayed swallow initiation-vallecula;Penetration/Aspiration during swallow;Pharyngeal residue - valleculae;Pharyngeal residue - pyriform;Reduced tongue base retraction;Reduced pharyngeal peristalsis Pharyngeal Material enters airway, remains ABOVE vocal cords and not ejected out;Material does not enter airway Pharyngeal- Nectar Straw Pharyngeal residue - valleculae;Pharyngeal residue - pyriform;Reduced pharyngeal peristalsis;Reduced tongue base retraction Pharyngeal- Thin Cup Penetration/Aspiration during swallow;Reduced tongue base retraction;Reduced pharyngeal peristalsis;Pharyngeal residue - valleculae;Pharyngeal residue - pyriform Pharyngeal Material enters airway, passes BELOW cords without attempt by patient to eject out (silent aspiration) Pharyngeal- Puree Delayed swallow initiation-vallecula;Pharyngeal residue - valleculae;Pharyngeal residue - pyriform;Reduced tongue base retraction;Reduced pharyngeal peristalsis Pharyngeal- Regular Pharyngeal residue - valleculae;Reduced laryngeal elevation    10/15/2021   2:55 PM Cervical Esophageal Phase  Cervical Esophageal Phase WFL [name] 10/15/2021, 3:15 PM                     CT ABDOMEN PELVIS W CONTRAST  Result Date: 10/14/2021 CLINICAL DATA:  Acute generalized abdominal pain. EXAM: CT ABDOMEN AND PELVIS WITH CONTRAST TECHNIQUE: Multidetector CT imaging of the abdomen and pelvis was performed using the standard protocol following  bolus administration of intravenous contrast. RADIATION DOSE REDUCTION: This exam was performed according to the departmental dose-optimization program which includes automated exposure control, adjustment of the mA and/or kV according to patient size and/or use of  iterative reconstruction technique. CONTRAST:  131m OMNIPAQUE IOHEXOL 300 MG/ML  SOLN COMPARISON:  None Available. FINDINGS: Lower Chest: Infiltrate or atelectasis is seen in the posterior right lower lobe. Hepatobiliary: No hepatic masses identified. Gallbladder is unremarkable. No evidence of biliary ductal dilatation. Pancreas: No mass or inflammatory changes. Diffuse fatty replacement of pancreas noted. Spleen: Within normal limits in size and appearance. Adrenals/Urinary Tract: Severe renal vascular calcification noted bilaterally. 2 mm calculus noted in midpole of left kidney. No masses identified. No evidence of ureteral calculi or hydronephrosis. The urinary bladder is dilated and numerous tiny calculi are seen along the dependent wall of the urinary bladder. Stomach/Bowel: Diffuse dilatation of small and large bowel is seen, which may be due to ileus or fecal impaction. Large amount of stool is seen distending the rectum. No evidence of bowel obstruction, inflammatory process or abnormal fluid collections. Vascular/Lymphatic: No pathologically enlarged lymph nodes. No acute vascular findings. Aortic atherosclerotic calcification incidentally noted. Reproductive: Visualization due to severe artifact from right hip prosthesis. Mildly enlarged prostate gland with mass effect on bladder base. Other:  None. Musculoskeletal:  No suspicious bone lesions identified. IMPRESSION: Large amount of stool distending the rectum, suspicious for fecal impaction. Diffuse dilatation of small and large bowel, which may be due to ileus or fecal impaction. Mildly enlarged prostate. Distended urinary bladder likely due to chronic bladder outlet obstruction. Numerous tiny bladder calculi. Tiny left renal calculus. No evidence of ureteral calculi or hydronephrosis. Infiltrate or atelectasis in posterior right lower lobe. Pneumonia cannot be excluded. Aortic Atherosclerosis (ICD10-I70.0). Electronically Signed   By: JMarlaine Hind M.D.   On: 10/14/2021 16:40   DG Chest Portable 1 View  Result Date: 10/14/2021 CLINICAL DATA:  Sepsis EXAM: PORTABLE CHEST 1 VIEW COMPARISON:  04/28/2020 FINDINGS: Loop recorder device overlies the left chest. The heart size and mediastinal contours are within normal limits. Streaky right basilar interstitial opacity. Appearance suggestive of free intraperitoneal air with increased lucency under the right hemidiaphragm and centrally. The visualized skeletal structures are unremarkable. IMPRESSION: 1. Appearance suggestive of free intraperitoneal air. Recommend CT of the abdomen and pelvis for further assessment. 2. Streaky right basilar interstitial opacity, which may represent atelectasis versus infiltrate. These results were called by telephone at the time of interpretation on 10/14/2021 at 3:21 pm [facility] who verbally acknowledged these results. Electronically Signed   By: NDavina PokeD.O.   On: 10/14/2021 15:23   CUP PACEART REMOTE DEVICE CHECK  Result Date: 10/08/2021 ILR summary report received. Battery status OK. Normal device function. No new symptom, tachy, brady, or pause episodes. 9 false AF episodes, oversensing of artifact.  Hx of Parkinsons, burden 3%.   Monthly summary reports and ROV/PRN LA   DISCHARGE EXAMINATION: Vitals:   11/03/21 2045 11/04/21 0522 11/04/21 1915 11/05/21 0314  BP: (!) 140/62 132/73 (!) 110/55 98/62  Pulse: 78 74 80 63  Resp: 18 18 (!) 21 18  Temp: 98.3 F (36.8 C) 98.4 F (36.9 C) 97.6 F (36.4 C) 97.6 F (36.4 C)  TempSrc: Oral Oral Oral Oral  SpO2: 98% 100% 98% 99%  Weight:      Height:       General appearance: Awake alert.  In no distress Resp: Clear to auscultation bilaterally.  Normal effort  Cardio: S1-S2 is normal regular.  No S3-S4.  No rubs murmurs or bruit GI: Abdomen is soft.  Nontender nondistended.  Bowel sounds are present normal.  No masses organomegaly    DISPOSITION: Home  Discharge Instructions      Call MD for:  difficulty breathing, headache or visual disturbances   Complete by: As directed    Call MD for:  extreme fatigue   Complete by: As directed    Call MD for:  persistant dizziness or light-headedness   Complete by: As directed    Call MD for:  persistant nausea and vomiting   Complete by: As directed    Call MD for:  severe uncontrolled pain   Complete by: As directed    Call MD for:  temperature >100.4   Complete by: As directed    Discharge instructions   Complete by: As directed    Please take your medications as prescribed.  Please note changes to your laxative regimen.  Please reach out to your provider at [facility] gastroenterology if constipation persists as they may have to start you on different medications.  You were cared for by a hospitalist during your hospital stay. If you have any questions about your discharge medications or the care you received while you were in the hospital after you are discharged, you can call the unit and asked to speak with the hospitalist on call if the hospitalist that took care of you is not available. Once you are discharged, your primary care physician will handle any further medical issues. Please note that NO REFILLS for any discharge medications will be authorized once you are discharged, as it is imperative that you return to your primary care physician (or establish a relationship with a primary care physician if you do not have one) for your aftercare needs so that they can reassess your need for medications and monitor your lab values. If you do not have a primary care physician, you can call [phone] for a physician referral.   Increase activity slowly   Complete by: As directed    No wound care   Complete by: As directed          Allergies as of 11/05/2021   No Known Allergies      Medication List     TAKE these medications    acetaminophen 500 MG tablet Commonly known as: TYLENOL Take 500 mg by mouth every 6  (six) hours as needed for moderate pain.   aspirin EC 81 MG tablet Take 81 mg by mouth daily.   atorvastatin 10 MG tablet Commonly known as: LIPITOR TAKE 1 TABLET(10 MG) BY MOUTH DAILY What changed: See the new instructions.   carbidopa-levodopa 25-100 MG tablet Commonly known as: SINEMET IR Take 1 tablet by mouth See admin instructions. Take 1 tablet by mouth at 7 AM, 10 AM, 1 PM, and 4 PM   carbidopa-levodopa 50-200 MG tablet Commonly known as: SINEMET CR TAKE 1 TABLET BY MOUTH EVERY NIGHT AT BEDTIME   cholecalciferol 25 MCG (1000 UNIT) tablet Commonly known as: VITAMIN D3 Take 1,000 Units by mouth daily.   ferrous sulfate 324 MG Tbec Take 324 mg by mouth daily with breakfast.   food thickener Gel Commonly known as: SIMPLYTHICK (NECTAR/LEVEL 2/MILDLY THICK) Take 1 packet by mouth as needed. What changed:  when to take this reasons to take this   guaiFENesin 600 MG 12 hr tablet Commonly known as: MUCINEX Take 600 mg by mouth every 12 (twelve) hours  as needed for to loosen phlegm or cough.   lactulose 10 GM/15ML solution Commonly known as: CHRONULAC Take 45 mLs (30 g total) by mouth 2 (two) times daily.   magnesium oxide 400 (240 Mg) MG tablet Commonly known as: MAG-OX Take 1 tablet (400 mg total) by mouth daily.   OneTouch Ultra test strip Generic drug: glucose blood   polyethylene glycol 17 g packet Commonly known as: MIRALAX / GLYCOLAX Take 17 g by mouth 3 (three) times daily. What changed: when to take this   Potassium 99 MG Tabs Take 99 mg by mouth daily.   QUEtiapine 25 MG tablet Commonly known as: SEROQUEL Take 0.5 tablets (12.5 mg total) by mouth at bedtime.   senna 8.6 MG Tabs tablet Commonly known as: SENOKOT Take 2 tablets (17.2 mg total) by mouth 2 (two) times daily. What changed: how much to take   tamsulosin 0.4 MG Caps capsule Commonly known as: FLOMAX Take 1 capsule (0.4 mg total) by mouth daily after supper.   tiZANidine 4 MG  tablet Commonly known as: ZANAFLEX Take 1 tablet (4 mg total) by mouth every 6 (six) hours as needed for muscle spasms.   VITAMIN B 12 PO Take 1 tablet by mouth daily.          Follow-up Information     [facility] Follow up.   Why: Please reach out to them for further issues with constipation Contact information: [address] [place] [address] [phone]                 TOTAL DISCHARGE TIME: 35 minutes  [facility] Pager on [email or web address]  11/06/2021, 11:25 AM

## 2021-11-05 NOTE — TOC Transition Note (Signed)
Transition of Care [facility]) - CM/SW Discharge Note   Patient Details  Name: Rodney Love MRN: [id number] Date of Birth: [birth year]  Transition of Care [facility]) CM/SW Contact:  Vassie Moselle, LCSW Phone Number: 11/05/2021, 9:40 AM   Clinical Narrative:    Met with pt's spouse at bedside and cofirmed plan for pt to return home with [facility] services. Pt is established with [facility] and receives HHRN/PT/OT/SLP services. These services will resume for pt at discharge. Pt is also connected with [facility] for palliative care services. Pt has no DME needs identified. Pt will be transported home by [facility].    Final next level of care: Home w Home Health Services Barriers to Discharge: No Barriers Identified   Patient Goals and CMS Choice     Choice offered to / list presented to : Spouse  Discharge Placement                       Discharge Plan and Services In-house Referral: Clinical Social Work Discharge Planning Services: NA Post Acute Care Choice: Home Health          DME Arranged: N/A DME Agency: NA                  Social Determinants of Health (SDOH) Interventions     Readmission Risk Interventions    11/05/2021    9:37 AM 10/16/2021    3:04 PM 05/01/2020   12:29 PM  Readmission Risk Prevention Plan  Post Dischage Appt Complete Complete   Medication Screening Complete Complete   Transportation Screening Complete Complete Complete  PCP or Specialist Appt within 5-7 Days   Complete  Home Care Screening   Complete  Medication Review (RN CM)   Complete

## 2021-11-06 DIAGNOSIS — K5641 Fecal impaction: Secondary | ICD-10-CM | POA: Diagnosis not present

## 2021-11-06 DIAGNOSIS — N390 Urinary tract infection, site not specified: Secondary | ICD-10-CM | POA: Diagnosis not present

## 2021-11-06 DIAGNOSIS — K5901 Slow transit constipation: Secondary | ICD-10-CM | POA: Diagnosis not present

## 2021-11-06 DIAGNOSIS — I700 Atherosclerosis of aorta: Secondary | ICD-10-CM | POA: Diagnosis not present

## 2021-11-06 DIAGNOSIS — I10 Essential (primary) hypertension: Secondary | ICD-10-CM | POA: Diagnosis not present

## 2021-11-07 DIAGNOSIS — Z466 Encounter for fitting and adjustment of urinary device: Secondary | ICD-10-CM | POA: Diagnosis not present

## 2021-11-08 NOTE — Progress Notes (Signed)
Carelink Summary Report / Loop Recorder 

## 2021-11-09 DIAGNOSIS — Z96641 Presence of right artificial hip joint: Secondary | ICD-10-CM | POA: Diagnosis not present

## 2021-11-09 DIAGNOSIS — Z9842 Cataract extraction status, left eye: Secondary | ICD-10-CM | POA: Diagnosis not present

## 2021-11-09 DIAGNOSIS — R1312 Dysphagia, oropharyngeal phase: Secondary | ICD-10-CM | POA: Diagnosis not present

## 2021-11-09 DIAGNOSIS — E876 Hypokalemia: Secondary | ICD-10-CM | POA: Diagnosis not present

## 2021-11-09 DIAGNOSIS — E1151 Type 2 diabetes mellitus with diabetic peripheral angiopathy without gangrene: Secondary | ICD-10-CM | POA: Diagnosis not present

## 2021-11-09 DIAGNOSIS — J690 Pneumonitis due to inhalation of food and vomit: Secondary | ICD-10-CM | POA: Diagnosis not present

## 2021-11-09 DIAGNOSIS — E43 Unspecified severe protein-calorie malnutrition: Secondary | ICD-10-CM | POA: Diagnosis not present

## 2021-11-09 DIAGNOSIS — N453 Epididymo-orchitis: Secondary | ICD-10-CM | POA: Diagnosis not present

## 2021-11-09 DIAGNOSIS — G20 Parkinson's disease: Secondary | ICD-10-CM | POA: Diagnosis not present

## 2021-11-09 DIAGNOSIS — Z7401 Bed confinement status: Secondary | ICD-10-CM | POA: Diagnosis not present

## 2021-11-09 DIAGNOSIS — E039 Hypothyroidism, unspecified: Secondary | ICD-10-CM | POA: Diagnosis not present

## 2021-11-09 DIAGNOSIS — R531 Weakness: Secondary | ICD-10-CM | POA: Diagnosis not present

## 2021-11-09 DIAGNOSIS — L89152 Pressure ulcer of sacral region, stage 2: Secondary | ICD-10-CM | POA: Diagnosis not present

## 2021-11-09 DIAGNOSIS — Z466 Encounter for fitting and adjustment of urinary device: Secondary | ICD-10-CM | POA: Diagnosis not present

## 2021-11-09 DIAGNOSIS — D509 Iron deficiency anemia, unspecified: Secondary | ICD-10-CM | POA: Diagnosis not present

## 2021-11-09 DIAGNOSIS — E871 Hypo-osmolality and hyponatremia: Secondary | ICD-10-CM | POA: Diagnosis not present

## 2021-11-09 DIAGNOSIS — Z9181 History of falling: Secondary | ICD-10-CM | POA: Diagnosis not present

## 2021-11-09 DIAGNOSIS — Z87440 Personal history of urinary (tract) infections: Secondary | ICD-10-CM | POA: Diagnosis not present

## 2021-11-09 DIAGNOSIS — E785 Hyperlipidemia, unspecified: Secondary | ICD-10-CM | POA: Diagnosis not present

## 2021-11-09 DIAGNOSIS — R627 Adult failure to thrive: Secondary | ICD-10-CM | POA: Diagnosis not present

## 2021-11-09 DIAGNOSIS — R339 Retention of urine, unspecified: Secondary | ICD-10-CM | POA: Diagnosis not present

## 2021-11-09 DIAGNOSIS — I2510 Atherosclerotic heart disease of native coronary artery without angina pectoris: Secondary | ICD-10-CM | POA: Diagnosis not present

## 2021-11-09 DIAGNOSIS — M1990 Unspecified osteoarthritis, unspecified site: Secondary | ICD-10-CM | POA: Diagnosis not present

## 2021-11-09 DIAGNOSIS — R338 Other retention of urine: Secondary | ICD-10-CM | POA: Diagnosis not present

## 2021-11-09 DIAGNOSIS — R404 Transient alteration of awareness: Secondary | ICD-10-CM | POA: Diagnosis not present

## 2021-11-09 DIAGNOSIS — K5900 Constipation, unspecified: Secondary | ICD-10-CM | POA: Diagnosis not present

## 2021-11-09 DIAGNOSIS — I10 Essential (primary) hypertension: Secondary | ICD-10-CM | POA: Diagnosis not present

## 2021-11-09 DIAGNOSIS — Z7982 Long term (current) use of aspirin: Secondary | ICD-10-CM | POA: Diagnosis not present

## 2021-11-12 ENCOUNTER — Other Ambulatory Visit: Payer: Medicare Other

## 2021-11-12 ENCOUNTER — Ambulatory Visit: Payer: Medicare Other

## 2021-11-12 DIAGNOSIS — Z7401 Bed confinement status: Secondary | ICD-10-CM | POA: Diagnosis not present

## 2021-11-12 DIAGNOSIS — Z87440 Personal history of urinary (tract) infections: Secondary | ICD-10-CM | POA: Diagnosis not present

## 2021-11-12 DIAGNOSIS — J690 Pneumonitis due to inhalation of food and vomit: Secondary | ICD-10-CM | POA: Diagnosis not present

## 2021-11-12 DIAGNOSIS — I10 Essential (primary) hypertension: Secondary | ICD-10-CM | POA: Diagnosis not present

## 2021-11-12 DIAGNOSIS — I2510 Atherosclerotic heart disease of native coronary artery without angina pectoris: Secondary | ICD-10-CM | POA: Diagnosis not present

## 2021-11-12 DIAGNOSIS — R627 Adult failure to thrive: Secondary | ICD-10-CM | POA: Diagnosis not present

## 2021-11-12 DIAGNOSIS — G20 Parkinson's disease: Secondary | ICD-10-CM | POA: Diagnosis not present

## 2021-11-12 DIAGNOSIS — E785 Hyperlipidemia, unspecified: Secondary | ICD-10-CM | POA: Diagnosis not present

## 2021-11-12 DIAGNOSIS — L89152 Pressure ulcer of sacral region, stage 2: Secondary | ICD-10-CM | POA: Diagnosis not present

## 2021-11-12 DIAGNOSIS — E1169 Type 2 diabetes mellitus with other specified complication: Secondary | ICD-10-CM | POA: Diagnosis not present

## 2021-11-12 DIAGNOSIS — Z7982 Long term (current) use of aspirin: Secondary | ICD-10-CM | POA: Diagnosis not present

## 2021-11-12 DIAGNOSIS — R1312 Dysphagia, oropharyngeal phase: Secondary | ICD-10-CM | POA: Diagnosis not present

## 2021-11-12 DIAGNOSIS — Z96641 Presence of right artificial hip joint: Secondary | ICD-10-CM | POA: Diagnosis not present

## 2021-11-12 DIAGNOSIS — E43 Unspecified severe protein-calorie malnutrition: Secondary | ICD-10-CM | POA: Diagnosis not present

## 2021-11-12 DIAGNOSIS — E876 Hypokalemia: Secondary | ICD-10-CM | POA: Diagnosis not present

## 2021-11-12 DIAGNOSIS — R339 Retention of urine, unspecified: Secondary | ICD-10-CM | POA: Diagnosis not present

## 2021-11-12 DIAGNOSIS — Z466 Encounter for fitting and adjustment of urinary device: Secondary | ICD-10-CM | POA: Diagnosis not present

## 2021-11-12 DIAGNOSIS — E1151 Type 2 diabetes mellitus with diabetic peripheral angiopathy without gangrene: Secondary | ICD-10-CM | POA: Diagnosis not present

## 2021-11-12 DIAGNOSIS — M1990 Unspecified osteoarthritis, unspecified site: Secondary | ICD-10-CM | POA: Diagnosis not present

## 2021-11-12 DIAGNOSIS — E871 Hypo-osmolality and hyponatremia: Secondary | ICD-10-CM | POA: Diagnosis not present

## 2021-11-12 DIAGNOSIS — E7800 Pure hypercholesterolemia, unspecified: Secondary | ICD-10-CM | POA: Diagnosis not present

## 2021-11-12 DIAGNOSIS — Z9181 History of falling: Secondary | ICD-10-CM | POA: Diagnosis not present

## 2021-11-12 DIAGNOSIS — K5900 Constipation, unspecified: Secondary | ICD-10-CM | POA: Diagnosis not present

## 2021-11-12 DIAGNOSIS — D509 Iron deficiency anemia, unspecified: Secondary | ICD-10-CM | POA: Diagnosis not present

## 2021-11-12 DIAGNOSIS — Z515 Encounter for palliative care: Secondary | ICD-10-CM

## 2021-11-12 DIAGNOSIS — E039 Hypothyroidism, unspecified: Secondary | ICD-10-CM | POA: Diagnosis not present

## 2021-11-12 DIAGNOSIS — Z9842 Cataract extraction status, left eye: Secondary | ICD-10-CM | POA: Diagnosis not present

## 2021-11-13 DIAGNOSIS — Z7982 Long term (current) use of aspirin: Secondary | ICD-10-CM | POA: Diagnosis not present

## 2021-11-13 DIAGNOSIS — I10 Essential (primary) hypertension: Secondary | ICD-10-CM | POA: Diagnosis not present

## 2021-11-13 DIAGNOSIS — Z96641 Presence of right artificial hip joint: Secondary | ICD-10-CM | POA: Diagnosis not present

## 2021-11-13 DIAGNOSIS — E1151 Type 2 diabetes mellitus with diabetic peripheral angiopathy without gangrene: Secondary | ICD-10-CM | POA: Diagnosis not present

## 2021-11-13 DIAGNOSIS — E039 Hypothyroidism, unspecified: Secondary | ICD-10-CM | POA: Diagnosis not present

## 2021-11-13 DIAGNOSIS — G20 Parkinson's disease: Secondary | ICD-10-CM | POA: Diagnosis not present

## 2021-11-13 DIAGNOSIS — R339 Retention of urine, unspecified: Secondary | ICD-10-CM | POA: Diagnosis not present

## 2021-11-13 DIAGNOSIS — Z9181 History of falling: Secondary | ICD-10-CM | POA: Diagnosis not present

## 2021-11-13 DIAGNOSIS — Z7401 Bed confinement status: Secondary | ICD-10-CM | POA: Diagnosis not present

## 2021-11-13 DIAGNOSIS — M1990 Unspecified osteoarthritis, unspecified site: Secondary | ICD-10-CM | POA: Diagnosis not present

## 2021-11-13 DIAGNOSIS — E43 Unspecified severe protein-calorie malnutrition: Secondary | ICD-10-CM | POA: Diagnosis not present

## 2021-11-13 DIAGNOSIS — Z87440 Personal history of urinary (tract) infections: Secondary | ICD-10-CM | POA: Diagnosis not present

## 2021-11-13 DIAGNOSIS — E785 Hyperlipidemia, unspecified: Secondary | ICD-10-CM | POA: Diagnosis not present

## 2021-11-13 DIAGNOSIS — I2510 Atherosclerotic heart disease of native coronary artery without angina pectoris: Secondary | ICD-10-CM | POA: Diagnosis not present

## 2021-11-13 DIAGNOSIS — E876 Hypokalemia: Secondary | ICD-10-CM | POA: Diagnosis not present

## 2021-11-13 DIAGNOSIS — L89152 Pressure ulcer of sacral region, stage 2: Secondary | ICD-10-CM | POA: Diagnosis not present

## 2021-11-13 DIAGNOSIS — D509 Iron deficiency anemia, unspecified: Secondary | ICD-10-CM | POA: Diagnosis not present

## 2021-11-13 DIAGNOSIS — R1312 Dysphagia, oropharyngeal phase: Secondary | ICD-10-CM | POA: Diagnosis not present

## 2021-11-13 DIAGNOSIS — Z466 Encounter for fitting and adjustment of urinary device: Secondary | ICD-10-CM | POA: Diagnosis not present

## 2021-11-13 DIAGNOSIS — K5900 Constipation, unspecified: Secondary | ICD-10-CM | POA: Diagnosis not present

## 2021-11-13 DIAGNOSIS — Z9842 Cataract extraction status, left eye: Secondary | ICD-10-CM | POA: Diagnosis not present

## 2021-11-13 DIAGNOSIS — J690 Pneumonitis due to inhalation of food and vomit: Secondary | ICD-10-CM | POA: Diagnosis not present

## 2021-11-13 DIAGNOSIS — R627 Adult failure to thrive: Secondary | ICD-10-CM | POA: Diagnosis not present

## 2021-11-13 DIAGNOSIS — E871 Hypo-osmolality and hyponatremia: Secondary | ICD-10-CM | POA: Diagnosis not present

## 2021-11-13 LAB — CUP PACEART REMOTE DEVICE CHECK
Date Time Interrogation Session: 20230815144613
Implantable Pulse Generator Implant Date: 20210830

## 2021-11-13 NOTE — Progress Notes (Signed)
COMMUNITY PALLIATIVE CARE SW NOTE  PATIENT NAME: Rodney Love DOB: June 09, 1945 MRN: [id number]  PRIMARY CARE PROVIDER: Lujean Amel, MD  RESPONSIBLE PARTY:  Acct ID - Guarantor Home Phone Work Phone Relationship Acct Type  [id number] [name], [name]  Self P/F     [address], [place], [place] [address]   SOCIAL WORK TELEPHONIC ENCOUNTER  PC SW completed a telephonic encounter with patient's daughter-Rodney Love. She provided a status update on patient. Patient was recently hospitalized from 11/02/21-11/05/21. Since his hospital discharge, patient has started receiving therapy through [facility]. Patient have another UTI that is being treated. He is eating a puree diet of 5 small meals a day. Patient is having increased weakness and is currently non-ambulatory. Patient has a pressure wound on his bottom. Currently PT/OT is working with patient in the bed with the goal of helping patient get her strength back. SW offered a visit for patient with her palliative provider. The daughter declined, asking SW to follow-up in a few weeks to schedule assess patient status and assess the need for a visit.  SW reinforced access to palliative care support and encouraged her to call with any questions or concerns.    CODE STATUS: Full Code ADVANCED DIRECTIVES: No  MOST FORM COMPLETE:  Yes HOSPICE EDUCATION PROVIDED: No  Duration of telephonic visit and documentation: 30 minutes  Namari Breton, LCSW

## 2021-11-14 DIAGNOSIS — Z466 Encounter for fitting and adjustment of urinary device: Secondary | ICD-10-CM | POA: Diagnosis not present

## 2021-11-14 DIAGNOSIS — L89152 Pressure ulcer of sacral region, stage 2: Secondary | ICD-10-CM | POA: Diagnosis not present

## 2021-11-15 DIAGNOSIS — Z9842 Cataract extraction status, left eye: Secondary | ICD-10-CM | POA: Diagnosis not present

## 2021-11-15 DIAGNOSIS — K5900 Constipation, unspecified: Secondary | ICD-10-CM | POA: Diagnosis not present

## 2021-11-15 DIAGNOSIS — J690 Pneumonitis due to inhalation of food and vomit: Secondary | ICD-10-CM | POA: Diagnosis not present

## 2021-11-15 DIAGNOSIS — E876 Hypokalemia: Secondary | ICD-10-CM | POA: Diagnosis not present

## 2021-11-15 DIAGNOSIS — L89152 Pressure ulcer of sacral region, stage 2: Secondary | ICD-10-CM | POA: Diagnosis not present

## 2021-11-15 DIAGNOSIS — Z9181 History of falling: Secondary | ICD-10-CM | POA: Diagnosis not present

## 2021-11-15 DIAGNOSIS — R1312 Dysphagia, oropharyngeal phase: Secondary | ICD-10-CM | POA: Diagnosis not present

## 2021-11-15 DIAGNOSIS — Z7401 Bed confinement status: Secondary | ICD-10-CM | POA: Diagnosis not present

## 2021-11-15 DIAGNOSIS — R627 Adult failure to thrive: Secondary | ICD-10-CM | POA: Diagnosis not present

## 2021-11-15 DIAGNOSIS — E871 Hypo-osmolality and hyponatremia: Secondary | ICD-10-CM | POA: Diagnosis not present

## 2021-11-15 DIAGNOSIS — E43 Unspecified severe protein-calorie malnutrition: Secondary | ICD-10-CM | POA: Diagnosis not present

## 2021-11-15 DIAGNOSIS — D509 Iron deficiency anemia, unspecified: Secondary | ICD-10-CM | POA: Diagnosis not present

## 2021-11-15 DIAGNOSIS — R339 Retention of urine, unspecified: Secondary | ICD-10-CM | POA: Diagnosis not present

## 2021-11-15 DIAGNOSIS — E039 Hypothyroidism, unspecified: Secondary | ICD-10-CM | POA: Diagnosis not present

## 2021-11-15 DIAGNOSIS — Z466 Encounter for fitting and adjustment of urinary device: Secondary | ICD-10-CM | POA: Diagnosis not present

## 2021-11-15 DIAGNOSIS — E1151 Type 2 diabetes mellitus with diabetic peripheral angiopathy without gangrene: Secondary | ICD-10-CM | POA: Diagnosis not present

## 2021-11-15 DIAGNOSIS — I10 Essential (primary) hypertension: Secondary | ICD-10-CM | POA: Diagnosis not present

## 2021-11-15 DIAGNOSIS — Z87440 Personal history of urinary (tract) infections: Secondary | ICD-10-CM | POA: Diagnosis not present

## 2021-11-15 DIAGNOSIS — I2510 Atherosclerotic heart disease of native coronary artery without angina pectoris: Secondary | ICD-10-CM | POA: Diagnosis not present

## 2021-11-15 DIAGNOSIS — Z7982 Long term (current) use of aspirin: Secondary | ICD-10-CM | POA: Diagnosis not present

## 2021-11-15 DIAGNOSIS — Z96641 Presence of right artificial hip joint: Secondary | ICD-10-CM | POA: Diagnosis not present

## 2021-11-15 DIAGNOSIS — M1990 Unspecified osteoarthritis, unspecified site: Secondary | ICD-10-CM | POA: Diagnosis not present

## 2021-11-15 DIAGNOSIS — E785 Hyperlipidemia, unspecified: Secondary | ICD-10-CM | POA: Diagnosis not present

## 2021-11-15 DIAGNOSIS — G20 Parkinson's disease: Secondary | ICD-10-CM | POA: Diagnosis not present

## 2021-11-19 DIAGNOSIS — L89152 Pressure ulcer of sacral region, stage 2: Secondary | ICD-10-CM | POA: Diagnosis not present

## 2021-11-19 DIAGNOSIS — Z7982 Long term (current) use of aspirin: Secondary | ICD-10-CM | POA: Diagnosis not present

## 2021-11-19 DIAGNOSIS — Z466 Encounter for fitting and adjustment of urinary device: Secondary | ICD-10-CM | POA: Diagnosis not present

## 2021-11-19 DIAGNOSIS — E43 Unspecified severe protein-calorie malnutrition: Secondary | ICD-10-CM | POA: Diagnosis not present

## 2021-11-19 DIAGNOSIS — Z87440 Personal history of urinary (tract) infections: Secondary | ICD-10-CM | POA: Diagnosis not present

## 2021-11-19 DIAGNOSIS — E876 Hypokalemia: Secondary | ICD-10-CM | POA: Diagnosis not present

## 2021-11-19 DIAGNOSIS — E1151 Type 2 diabetes mellitus with diabetic peripheral angiopathy without gangrene: Secondary | ICD-10-CM | POA: Diagnosis not present

## 2021-11-19 DIAGNOSIS — K5900 Constipation, unspecified: Secondary | ICD-10-CM | POA: Diagnosis not present

## 2021-11-19 DIAGNOSIS — E785 Hyperlipidemia, unspecified: Secondary | ICD-10-CM | POA: Diagnosis not present

## 2021-11-19 DIAGNOSIS — J690 Pneumonitis due to inhalation of food and vomit: Secondary | ICD-10-CM | POA: Diagnosis not present

## 2021-11-19 DIAGNOSIS — G20 Parkinson's disease: Secondary | ICD-10-CM | POA: Diagnosis not present

## 2021-11-19 DIAGNOSIS — Z9842 Cataract extraction status, left eye: Secondary | ICD-10-CM | POA: Diagnosis not present

## 2021-11-19 DIAGNOSIS — E871 Hypo-osmolality and hyponatremia: Secondary | ICD-10-CM | POA: Diagnosis not present

## 2021-11-19 DIAGNOSIS — I10 Essential (primary) hypertension: Secondary | ICD-10-CM | POA: Diagnosis not present

## 2021-11-19 DIAGNOSIS — Z96641 Presence of right artificial hip joint: Secondary | ICD-10-CM | POA: Diagnosis not present

## 2021-11-19 DIAGNOSIS — R339 Retention of urine, unspecified: Secondary | ICD-10-CM | POA: Diagnosis not present

## 2021-11-19 DIAGNOSIS — M1990 Unspecified osteoarthritis, unspecified site: Secondary | ICD-10-CM | POA: Diagnosis not present

## 2021-11-19 DIAGNOSIS — D509 Iron deficiency anemia, unspecified: Secondary | ICD-10-CM | POA: Diagnosis not present

## 2021-11-19 DIAGNOSIS — I2510 Atherosclerotic heart disease of native coronary artery without angina pectoris: Secondary | ICD-10-CM | POA: Diagnosis not present

## 2021-11-19 DIAGNOSIS — Z9181 History of falling: Secondary | ICD-10-CM | POA: Diagnosis not present

## 2021-11-19 DIAGNOSIS — R1312 Dysphagia, oropharyngeal phase: Secondary | ICD-10-CM | POA: Diagnosis not present

## 2021-11-19 DIAGNOSIS — Z7401 Bed confinement status: Secondary | ICD-10-CM | POA: Diagnosis not present

## 2021-11-19 DIAGNOSIS — E039 Hypothyroidism, unspecified: Secondary | ICD-10-CM | POA: Diagnosis not present

## 2021-11-19 DIAGNOSIS — R627 Adult failure to thrive: Secondary | ICD-10-CM | POA: Diagnosis not present

## 2021-11-20 DIAGNOSIS — J690 Pneumonitis due to inhalation of food and vomit: Secondary | ICD-10-CM | POA: Diagnosis not present

## 2021-11-20 DIAGNOSIS — R627 Adult failure to thrive: Secondary | ICD-10-CM | POA: Diagnosis not present

## 2021-11-20 DIAGNOSIS — Z9842 Cataract extraction status, left eye: Secondary | ICD-10-CM | POA: Diagnosis not present

## 2021-11-20 DIAGNOSIS — K5900 Constipation, unspecified: Secondary | ICD-10-CM | POA: Diagnosis not present

## 2021-11-20 DIAGNOSIS — E785 Hyperlipidemia, unspecified: Secondary | ICD-10-CM | POA: Diagnosis not present

## 2021-11-20 DIAGNOSIS — G20 Parkinson's disease: Secondary | ICD-10-CM | POA: Diagnosis not present

## 2021-11-20 DIAGNOSIS — I2510 Atherosclerotic heart disease of native coronary artery without angina pectoris: Secondary | ICD-10-CM | POA: Diagnosis not present

## 2021-11-20 DIAGNOSIS — E039 Hypothyroidism, unspecified: Secondary | ICD-10-CM | POA: Diagnosis not present

## 2021-11-20 DIAGNOSIS — D509 Iron deficiency anemia, unspecified: Secondary | ICD-10-CM | POA: Diagnosis not present

## 2021-11-20 DIAGNOSIS — M1990 Unspecified osteoarthritis, unspecified site: Secondary | ICD-10-CM | POA: Diagnosis not present

## 2021-11-20 DIAGNOSIS — L89152 Pressure ulcer of sacral region, stage 2: Secondary | ICD-10-CM | POA: Diagnosis not present

## 2021-11-20 DIAGNOSIS — R1312 Dysphagia, oropharyngeal phase: Secondary | ICD-10-CM | POA: Diagnosis not present

## 2021-11-20 DIAGNOSIS — Z9181 History of falling: Secondary | ICD-10-CM | POA: Diagnosis not present

## 2021-11-20 DIAGNOSIS — Z466 Encounter for fitting and adjustment of urinary device: Secondary | ICD-10-CM | POA: Diagnosis not present

## 2021-11-20 DIAGNOSIS — Z96641 Presence of right artificial hip joint: Secondary | ICD-10-CM | POA: Diagnosis not present

## 2021-11-20 DIAGNOSIS — Z87440 Personal history of urinary (tract) infections: Secondary | ICD-10-CM | POA: Diagnosis not present

## 2021-11-20 DIAGNOSIS — E876 Hypokalemia: Secondary | ICD-10-CM | POA: Diagnosis not present

## 2021-11-20 DIAGNOSIS — E871 Hypo-osmolality and hyponatremia: Secondary | ICD-10-CM | POA: Diagnosis not present

## 2021-11-20 DIAGNOSIS — Z7401 Bed confinement status: Secondary | ICD-10-CM | POA: Diagnosis not present

## 2021-11-20 DIAGNOSIS — E43 Unspecified severe protein-calorie malnutrition: Secondary | ICD-10-CM | POA: Diagnosis not present

## 2021-11-20 DIAGNOSIS — I10 Essential (primary) hypertension: Secondary | ICD-10-CM | POA: Diagnosis not present

## 2021-11-20 DIAGNOSIS — E1151 Type 2 diabetes mellitus with diabetic peripheral angiopathy without gangrene: Secondary | ICD-10-CM | POA: Diagnosis not present

## 2021-11-20 DIAGNOSIS — Z7982 Long term (current) use of aspirin: Secondary | ICD-10-CM | POA: Diagnosis not present

## 2021-11-20 DIAGNOSIS — R339 Retention of urine, unspecified: Secondary | ICD-10-CM | POA: Diagnosis not present

## 2021-11-21 DIAGNOSIS — G20 Parkinson's disease: Secondary | ICD-10-CM | POA: Diagnosis not present

## 2021-11-21 DIAGNOSIS — M1990 Unspecified osteoarthritis, unspecified site: Secondary | ICD-10-CM | POA: Diagnosis not present

## 2021-11-21 DIAGNOSIS — L89152 Pressure ulcer of sacral region, stage 2: Secondary | ICD-10-CM | POA: Diagnosis not present

## 2021-11-21 DIAGNOSIS — Z7401 Bed confinement status: Secondary | ICD-10-CM | POA: Diagnosis not present

## 2021-11-21 DIAGNOSIS — Z96641 Presence of right artificial hip joint: Secondary | ICD-10-CM | POA: Diagnosis not present

## 2021-11-21 DIAGNOSIS — D509 Iron deficiency anemia, unspecified: Secondary | ICD-10-CM | POA: Diagnosis not present

## 2021-11-21 DIAGNOSIS — Z9842 Cataract extraction status, left eye: Secondary | ICD-10-CM | POA: Diagnosis not present

## 2021-11-21 DIAGNOSIS — E1151 Type 2 diabetes mellitus with diabetic peripheral angiopathy without gangrene: Secondary | ICD-10-CM | POA: Diagnosis not present

## 2021-11-21 DIAGNOSIS — Z9181 History of falling: Secondary | ICD-10-CM | POA: Diagnosis not present

## 2021-11-21 DIAGNOSIS — R1312 Dysphagia, oropharyngeal phase: Secondary | ICD-10-CM | POA: Diagnosis not present

## 2021-11-21 DIAGNOSIS — I10 Essential (primary) hypertension: Secondary | ICD-10-CM | POA: Diagnosis not present

## 2021-11-21 DIAGNOSIS — E43 Unspecified severe protein-calorie malnutrition: Secondary | ICD-10-CM | POA: Diagnosis not present

## 2021-11-21 DIAGNOSIS — R627 Adult failure to thrive: Secondary | ICD-10-CM | POA: Diagnosis not present

## 2021-11-21 DIAGNOSIS — K5900 Constipation, unspecified: Secondary | ICD-10-CM | POA: Diagnosis not present

## 2021-11-21 DIAGNOSIS — E039 Hypothyroidism, unspecified: Secondary | ICD-10-CM | POA: Diagnosis not present

## 2021-11-21 DIAGNOSIS — R339 Retention of urine, unspecified: Secondary | ICD-10-CM | POA: Diagnosis not present

## 2021-11-21 DIAGNOSIS — Z87440 Personal history of urinary (tract) infections: Secondary | ICD-10-CM | POA: Diagnosis not present

## 2021-11-21 DIAGNOSIS — E785 Hyperlipidemia, unspecified: Secondary | ICD-10-CM | POA: Diagnosis not present

## 2021-11-21 DIAGNOSIS — I2510 Atherosclerotic heart disease of native coronary artery without angina pectoris: Secondary | ICD-10-CM | POA: Diagnosis not present

## 2021-11-21 DIAGNOSIS — Z7982 Long term (current) use of aspirin: Secondary | ICD-10-CM | POA: Diagnosis not present

## 2021-11-21 DIAGNOSIS — E871 Hypo-osmolality and hyponatremia: Secondary | ICD-10-CM | POA: Diagnosis not present

## 2021-11-21 DIAGNOSIS — E876 Hypokalemia: Secondary | ICD-10-CM | POA: Diagnosis not present

## 2021-11-21 DIAGNOSIS — J690 Pneumonitis due to inhalation of food and vomit: Secondary | ICD-10-CM | POA: Diagnosis not present

## 2021-11-21 DIAGNOSIS — Z466 Encounter for fitting and adjustment of urinary device: Secondary | ICD-10-CM | POA: Diagnosis not present

## 2021-11-22 DIAGNOSIS — R339 Retention of urine, unspecified: Secondary | ICD-10-CM | POA: Diagnosis not present

## 2021-11-22 DIAGNOSIS — Z87440 Personal history of urinary (tract) infections: Secondary | ICD-10-CM | POA: Diagnosis not present

## 2021-11-22 DIAGNOSIS — L89152 Pressure ulcer of sacral region, stage 2: Secondary | ICD-10-CM | POA: Diagnosis not present

## 2021-11-22 DIAGNOSIS — I2510 Atherosclerotic heart disease of native coronary artery without angina pectoris: Secondary | ICD-10-CM | POA: Diagnosis not present

## 2021-11-22 DIAGNOSIS — G20 Parkinson's disease: Secondary | ICD-10-CM | POA: Diagnosis not present

## 2021-11-22 DIAGNOSIS — E876 Hypokalemia: Secondary | ICD-10-CM | POA: Diagnosis not present

## 2021-11-22 DIAGNOSIS — D509 Iron deficiency anemia, unspecified: Secondary | ICD-10-CM | POA: Diagnosis not present

## 2021-11-22 DIAGNOSIS — Z466 Encounter for fitting and adjustment of urinary device: Secondary | ICD-10-CM | POA: Diagnosis not present

## 2021-11-22 DIAGNOSIS — Z7401 Bed confinement status: Secondary | ICD-10-CM | POA: Diagnosis not present

## 2021-11-22 DIAGNOSIS — Z9181 History of falling: Secondary | ICD-10-CM | POA: Diagnosis not present

## 2021-11-22 DIAGNOSIS — E785 Hyperlipidemia, unspecified: Secondary | ICD-10-CM | POA: Diagnosis not present

## 2021-11-22 DIAGNOSIS — R1312 Dysphagia, oropharyngeal phase: Secondary | ICD-10-CM | POA: Diagnosis not present

## 2021-11-22 DIAGNOSIS — I10 Essential (primary) hypertension: Secondary | ICD-10-CM | POA: Diagnosis not present

## 2021-11-22 DIAGNOSIS — K5900 Constipation, unspecified: Secondary | ICD-10-CM | POA: Diagnosis not present

## 2021-11-22 DIAGNOSIS — Z9842 Cataract extraction status, left eye: Secondary | ICD-10-CM | POA: Diagnosis not present

## 2021-11-22 DIAGNOSIS — M1990 Unspecified osteoarthritis, unspecified site: Secondary | ICD-10-CM | POA: Diagnosis not present

## 2021-11-22 DIAGNOSIS — E1151 Type 2 diabetes mellitus with diabetic peripheral angiopathy without gangrene: Secondary | ICD-10-CM | POA: Diagnosis not present

## 2021-11-22 DIAGNOSIS — E871 Hypo-osmolality and hyponatremia: Secondary | ICD-10-CM | POA: Diagnosis not present

## 2021-11-22 DIAGNOSIS — J690 Pneumonitis due to inhalation of food and vomit: Secondary | ICD-10-CM | POA: Diagnosis not present

## 2021-11-22 DIAGNOSIS — Z96641 Presence of right artificial hip joint: Secondary | ICD-10-CM | POA: Diagnosis not present

## 2021-11-22 DIAGNOSIS — E43 Unspecified severe protein-calorie malnutrition: Secondary | ICD-10-CM | POA: Diagnosis not present

## 2021-11-22 DIAGNOSIS — E039 Hypothyroidism, unspecified: Secondary | ICD-10-CM | POA: Diagnosis not present

## 2021-11-22 DIAGNOSIS — R627 Adult failure to thrive: Secondary | ICD-10-CM | POA: Diagnosis not present

## 2021-11-22 DIAGNOSIS — Z7982 Long term (current) use of aspirin: Secondary | ICD-10-CM | POA: Diagnosis not present

## 2021-11-26 DIAGNOSIS — Z7982 Long term (current) use of aspirin: Secondary | ICD-10-CM | POA: Diagnosis not present

## 2021-11-26 DIAGNOSIS — Z96641 Presence of right artificial hip joint: Secondary | ICD-10-CM | POA: Diagnosis not present

## 2021-11-26 DIAGNOSIS — I2510 Atherosclerotic heart disease of native coronary artery without angina pectoris: Secondary | ICD-10-CM | POA: Diagnosis not present

## 2021-11-26 DIAGNOSIS — E1151 Type 2 diabetes mellitus with diabetic peripheral angiopathy without gangrene: Secondary | ICD-10-CM | POA: Diagnosis not present

## 2021-11-26 DIAGNOSIS — Z7401 Bed confinement status: Secondary | ICD-10-CM | POA: Diagnosis not present

## 2021-11-26 DIAGNOSIS — R339 Retention of urine, unspecified: Secondary | ICD-10-CM | POA: Diagnosis not present

## 2021-11-26 DIAGNOSIS — R1312 Dysphagia, oropharyngeal phase: Secondary | ICD-10-CM | POA: Diagnosis not present

## 2021-11-26 DIAGNOSIS — Z9842 Cataract extraction status, left eye: Secondary | ICD-10-CM | POA: Diagnosis not present

## 2021-11-26 DIAGNOSIS — I10 Essential (primary) hypertension: Secondary | ICD-10-CM | POA: Diagnosis not present

## 2021-11-26 DIAGNOSIS — Z87440 Personal history of urinary (tract) infections: Secondary | ICD-10-CM | POA: Diagnosis not present

## 2021-11-26 DIAGNOSIS — D509 Iron deficiency anemia, unspecified: Secondary | ICD-10-CM | POA: Diagnosis not present

## 2021-11-26 DIAGNOSIS — L89152 Pressure ulcer of sacral region, stage 2: Secondary | ICD-10-CM | POA: Diagnosis not present

## 2021-11-26 DIAGNOSIS — M1990 Unspecified osteoarthritis, unspecified site: Secondary | ICD-10-CM | POA: Diagnosis not present

## 2021-11-26 DIAGNOSIS — E039 Hypothyroidism, unspecified: Secondary | ICD-10-CM | POA: Diagnosis not present

## 2021-11-26 DIAGNOSIS — K5900 Constipation, unspecified: Secondary | ICD-10-CM | POA: Diagnosis not present

## 2021-11-26 DIAGNOSIS — E871 Hypo-osmolality and hyponatremia: Secondary | ICD-10-CM | POA: Diagnosis not present

## 2021-11-26 DIAGNOSIS — E43 Unspecified severe protein-calorie malnutrition: Secondary | ICD-10-CM | POA: Diagnosis not present

## 2021-11-26 DIAGNOSIS — J690 Pneumonitis due to inhalation of food and vomit: Secondary | ICD-10-CM | POA: Diagnosis not present

## 2021-11-26 DIAGNOSIS — G20 Parkinson's disease: Secondary | ICD-10-CM | POA: Diagnosis not present

## 2021-11-26 DIAGNOSIS — Z466 Encounter for fitting and adjustment of urinary device: Secondary | ICD-10-CM | POA: Diagnosis not present

## 2021-11-26 DIAGNOSIS — Z9181 History of falling: Secondary | ICD-10-CM | POA: Diagnosis not present

## 2021-11-26 DIAGNOSIS — E876 Hypokalemia: Secondary | ICD-10-CM | POA: Diagnosis not present

## 2021-11-26 DIAGNOSIS — R627 Adult failure to thrive: Secondary | ICD-10-CM | POA: Diagnosis not present

## 2021-11-26 DIAGNOSIS — E785 Hyperlipidemia, unspecified: Secondary | ICD-10-CM | POA: Diagnosis not present

## 2021-11-27 DIAGNOSIS — J690 Pneumonitis due to inhalation of food and vomit: Secondary | ICD-10-CM | POA: Diagnosis not present

## 2021-11-27 DIAGNOSIS — Z9842 Cataract extraction status, left eye: Secondary | ICD-10-CM | POA: Diagnosis not present

## 2021-11-27 DIAGNOSIS — R339 Retention of urine, unspecified: Secondary | ICD-10-CM | POA: Diagnosis not present

## 2021-11-27 DIAGNOSIS — E876 Hypokalemia: Secondary | ICD-10-CM | POA: Diagnosis not present

## 2021-11-27 DIAGNOSIS — E43 Unspecified severe protein-calorie malnutrition: Secondary | ICD-10-CM | POA: Diagnosis not present

## 2021-11-27 DIAGNOSIS — R627 Adult failure to thrive: Secondary | ICD-10-CM | POA: Diagnosis not present

## 2021-11-27 DIAGNOSIS — I10 Essential (primary) hypertension: Secondary | ICD-10-CM | POA: Diagnosis not present

## 2021-11-27 DIAGNOSIS — E039 Hypothyroidism, unspecified: Secondary | ICD-10-CM | POA: Diagnosis not present

## 2021-11-27 DIAGNOSIS — Z466 Encounter for fitting and adjustment of urinary device: Secondary | ICD-10-CM | POA: Diagnosis not present

## 2021-11-27 DIAGNOSIS — M1990 Unspecified osteoarthritis, unspecified site: Secondary | ICD-10-CM | POA: Diagnosis not present

## 2021-11-27 DIAGNOSIS — Z7401 Bed confinement status: Secondary | ICD-10-CM | POA: Diagnosis not present

## 2021-11-27 DIAGNOSIS — Z7982 Long term (current) use of aspirin: Secondary | ICD-10-CM | POA: Diagnosis not present

## 2021-11-27 DIAGNOSIS — G20 Parkinson's disease: Secondary | ICD-10-CM | POA: Diagnosis not present

## 2021-11-27 DIAGNOSIS — E871 Hypo-osmolality and hyponatremia: Secondary | ICD-10-CM | POA: Diagnosis not present

## 2021-11-27 DIAGNOSIS — R1312 Dysphagia, oropharyngeal phase: Secondary | ICD-10-CM | POA: Diagnosis not present

## 2021-11-27 DIAGNOSIS — E785 Hyperlipidemia, unspecified: Secondary | ICD-10-CM | POA: Diagnosis not present

## 2021-11-27 DIAGNOSIS — Z87440 Personal history of urinary (tract) infections: Secondary | ICD-10-CM | POA: Diagnosis not present

## 2021-11-27 DIAGNOSIS — I2510 Atherosclerotic heart disease of native coronary artery without angina pectoris: Secondary | ICD-10-CM | POA: Diagnosis not present

## 2021-11-27 DIAGNOSIS — L89152 Pressure ulcer of sacral region, stage 2: Secondary | ICD-10-CM | POA: Diagnosis not present

## 2021-11-27 DIAGNOSIS — E1151 Type 2 diabetes mellitus with diabetic peripheral angiopathy without gangrene: Secondary | ICD-10-CM | POA: Diagnosis not present

## 2021-11-27 DIAGNOSIS — K5900 Constipation, unspecified: Secondary | ICD-10-CM | POA: Diagnosis not present

## 2021-11-27 DIAGNOSIS — D509 Iron deficiency anemia, unspecified: Secondary | ICD-10-CM | POA: Diagnosis not present

## 2021-11-27 DIAGNOSIS — Z9181 History of falling: Secondary | ICD-10-CM | POA: Diagnosis not present

## 2021-11-27 DIAGNOSIS — Z96641 Presence of right artificial hip joint: Secondary | ICD-10-CM | POA: Diagnosis not present

## 2021-11-30 DIAGNOSIS — E871 Hypo-osmolality and hyponatremia: Secondary | ICD-10-CM | POA: Diagnosis not present

## 2021-11-30 DIAGNOSIS — R1312 Dysphagia, oropharyngeal phase: Secondary | ICD-10-CM | POA: Diagnosis not present

## 2021-11-30 DIAGNOSIS — M1990 Unspecified osteoarthritis, unspecified site: Secondary | ICD-10-CM | POA: Diagnosis not present

## 2021-11-30 DIAGNOSIS — R339 Retention of urine, unspecified: Secondary | ICD-10-CM | POA: Diagnosis not present

## 2021-11-30 DIAGNOSIS — K5900 Constipation, unspecified: Secondary | ICD-10-CM | POA: Diagnosis not present

## 2021-11-30 DIAGNOSIS — R627 Adult failure to thrive: Secondary | ICD-10-CM | POA: Diagnosis not present

## 2021-11-30 DIAGNOSIS — E876 Hypokalemia: Secondary | ICD-10-CM | POA: Diagnosis not present

## 2021-11-30 DIAGNOSIS — D509 Iron deficiency anemia, unspecified: Secondary | ICD-10-CM | POA: Diagnosis not present

## 2021-11-30 DIAGNOSIS — Z7401 Bed confinement status: Secondary | ICD-10-CM | POA: Diagnosis not present

## 2021-11-30 DIAGNOSIS — J690 Pneumonitis due to inhalation of food and vomit: Secondary | ICD-10-CM | POA: Diagnosis not present

## 2021-11-30 DIAGNOSIS — I2510 Atherosclerotic heart disease of native coronary artery without angina pectoris: Secondary | ICD-10-CM | POA: Diagnosis not present

## 2021-11-30 DIAGNOSIS — E785 Hyperlipidemia, unspecified: Secondary | ICD-10-CM | POA: Diagnosis not present

## 2021-11-30 DIAGNOSIS — Z96641 Presence of right artificial hip joint: Secondary | ICD-10-CM | POA: Diagnosis not present

## 2021-11-30 DIAGNOSIS — E1151 Type 2 diabetes mellitus with diabetic peripheral angiopathy without gangrene: Secondary | ICD-10-CM | POA: Diagnosis not present

## 2021-11-30 DIAGNOSIS — I10 Essential (primary) hypertension: Secondary | ICD-10-CM | POA: Diagnosis not present

## 2021-11-30 DIAGNOSIS — Z87440 Personal history of urinary (tract) infections: Secondary | ICD-10-CM | POA: Diagnosis not present

## 2021-11-30 DIAGNOSIS — Z7982 Long term (current) use of aspirin: Secondary | ICD-10-CM | POA: Diagnosis not present

## 2021-11-30 DIAGNOSIS — G20 Parkinson's disease: Secondary | ICD-10-CM | POA: Diagnosis not present

## 2021-11-30 DIAGNOSIS — Z9842 Cataract extraction status, left eye: Secondary | ICD-10-CM | POA: Diagnosis not present

## 2021-11-30 DIAGNOSIS — Z466 Encounter for fitting and adjustment of urinary device: Secondary | ICD-10-CM | POA: Diagnosis not present

## 2021-11-30 DIAGNOSIS — L89152 Pressure ulcer of sacral region, stage 2: Secondary | ICD-10-CM | POA: Diagnosis not present

## 2021-11-30 DIAGNOSIS — E43 Unspecified severe protein-calorie malnutrition: Secondary | ICD-10-CM | POA: Diagnosis not present

## 2021-11-30 DIAGNOSIS — E039 Hypothyroidism, unspecified: Secondary | ICD-10-CM | POA: Diagnosis not present

## 2021-11-30 DIAGNOSIS — Z9181 History of falling: Secondary | ICD-10-CM | POA: Diagnosis not present

## 2021-12-03 DIAGNOSIS — K5900 Constipation, unspecified: Secondary | ICD-10-CM | POA: Diagnosis not present

## 2021-12-03 DIAGNOSIS — M1990 Unspecified osteoarthritis, unspecified site: Secondary | ICD-10-CM | POA: Diagnosis not present

## 2021-12-03 DIAGNOSIS — E876 Hypokalemia: Secondary | ICD-10-CM | POA: Diagnosis not present

## 2021-12-03 DIAGNOSIS — Z87440 Personal history of urinary (tract) infections: Secondary | ICD-10-CM | POA: Diagnosis not present

## 2021-12-03 DIAGNOSIS — Z7401 Bed confinement status: Secondary | ICD-10-CM | POA: Diagnosis not present

## 2021-12-03 DIAGNOSIS — I2510 Atherosclerotic heart disease of native coronary artery without angina pectoris: Secondary | ICD-10-CM | POA: Diagnosis not present

## 2021-12-03 DIAGNOSIS — E43 Unspecified severe protein-calorie malnutrition: Secondary | ICD-10-CM | POA: Diagnosis not present

## 2021-12-03 DIAGNOSIS — D509 Iron deficiency anemia, unspecified: Secondary | ICD-10-CM | POA: Diagnosis not present

## 2021-12-03 DIAGNOSIS — J690 Pneumonitis due to inhalation of food and vomit: Secondary | ICD-10-CM | POA: Diagnosis not present

## 2021-12-03 DIAGNOSIS — Z9181 History of falling: Secondary | ICD-10-CM | POA: Diagnosis not present

## 2021-12-03 DIAGNOSIS — E785 Hyperlipidemia, unspecified: Secondary | ICD-10-CM | POA: Diagnosis not present

## 2021-12-03 DIAGNOSIS — Z96641 Presence of right artificial hip joint: Secondary | ICD-10-CM | POA: Diagnosis not present

## 2021-12-03 DIAGNOSIS — G20 Parkinson's disease: Secondary | ICD-10-CM | POA: Diagnosis not present

## 2021-12-03 DIAGNOSIS — L89152 Pressure ulcer of sacral region, stage 2: Secondary | ICD-10-CM | POA: Diagnosis not present

## 2021-12-03 DIAGNOSIS — I10 Essential (primary) hypertension: Secondary | ICD-10-CM | POA: Diagnosis not present

## 2021-12-03 DIAGNOSIS — E1151 Type 2 diabetes mellitus with diabetic peripheral angiopathy without gangrene: Secondary | ICD-10-CM | POA: Diagnosis not present

## 2021-12-03 DIAGNOSIS — R627 Adult failure to thrive: Secondary | ICD-10-CM | POA: Diagnosis not present

## 2021-12-03 DIAGNOSIS — Z466 Encounter for fitting and adjustment of urinary device: Secondary | ICD-10-CM | POA: Diagnosis not present

## 2021-12-03 DIAGNOSIS — Z7982 Long term (current) use of aspirin: Secondary | ICD-10-CM | POA: Diagnosis not present

## 2021-12-03 DIAGNOSIS — Z9842 Cataract extraction status, left eye: Secondary | ICD-10-CM | POA: Diagnosis not present

## 2021-12-03 DIAGNOSIS — E871 Hypo-osmolality and hyponatremia: Secondary | ICD-10-CM | POA: Diagnosis not present

## 2021-12-03 DIAGNOSIS — E039 Hypothyroidism, unspecified: Secondary | ICD-10-CM | POA: Diagnosis not present

## 2021-12-03 DIAGNOSIS — R1312 Dysphagia, oropharyngeal phase: Secondary | ICD-10-CM | POA: Diagnosis not present

## 2021-12-03 DIAGNOSIS — R339 Retention of urine, unspecified: Secondary | ICD-10-CM | POA: Diagnosis not present

## 2021-12-04 DIAGNOSIS — Z466 Encounter for fitting and adjustment of urinary device: Secondary | ICD-10-CM | POA: Diagnosis not present

## 2021-12-04 DIAGNOSIS — D509 Iron deficiency anemia, unspecified: Secondary | ICD-10-CM | POA: Diagnosis not present

## 2021-12-04 DIAGNOSIS — I2510 Atherosclerotic heart disease of native coronary artery without angina pectoris: Secondary | ICD-10-CM | POA: Diagnosis not present

## 2021-12-04 DIAGNOSIS — Z9842 Cataract extraction status, left eye: Secondary | ICD-10-CM | POA: Diagnosis not present

## 2021-12-04 DIAGNOSIS — G20 Parkinson's disease: Secondary | ICD-10-CM | POA: Diagnosis not present

## 2021-12-04 DIAGNOSIS — E43 Unspecified severe protein-calorie malnutrition: Secondary | ICD-10-CM | POA: Diagnosis not present

## 2021-12-04 DIAGNOSIS — R627 Adult failure to thrive: Secondary | ICD-10-CM | POA: Diagnosis not present

## 2021-12-04 DIAGNOSIS — Z87440 Personal history of urinary (tract) infections: Secondary | ICD-10-CM | POA: Diagnosis not present

## 2021-12-04 DIAGNOSIS — K5900 Constipation, unspecified: Secondary | ICD-10-CM | POA: Diagnosis not present

## 2021-12-04 DIAGNOSIS — E871 Hypo-osmolality and hyponatremia: Secondary | ICD-10-CM | POA: Diagnosis not present

## 2021-12-04 DIAGNOSIS — M1990 Unspecified osteoarthritis, unspecified site: Secondary | ICD-10-CM | POA: Diagnosis not present

## 2021-12-04 DIAGNOSIS — Z7982 Long term (current) use of aspirin: Secondary | ICD-10-CM | POA: Diagnosis not present

## 2021-12-04 DIAGNOSIS — L89152 Pressure ulcer of sacral region, stage 2: Secondary | ICD-10-CM | POA: Diagnosis not present

## 2021-12-04 DIAGNOSIS — Z96641 Presence of right artificial hip joint: Secondary | ICD-10-CM | POA: Diagnosis not present

## 2021-12-04 DIAGNOSIS — Z7401 Bed confinement status: Secondary | ICD-10-CM | POA: Diagnosis not present

## 2021-12-04 DIAGNOSIS — R1312 Dysphagia, oropharyngeal phase: Secondary | ICD-10-CM | POA: Diagnosis not present

## 2021-12-04 DIAGNOSIS — Z9181 History of falling: Secondary | ICD-10-CM | POA: Diagnosis not present

## 2021-12-04 DIAGNOSIS — I10 Essential (primary) hypertension: Secondary | ICD-10-CM | POA: Diagnosis not present

## 2021-12-04 DIAGNOSIS — R339 Retention of urine, unspecified: Secondary | ICD-10-CM | POA: Diagnosis not present

## 2021-12-04 DIAGNOSIS — E785 Hyperlipidemia, unspecified: Secondary | ICD-10-CM | POA: Diagnosis not present

## 2021-12-04 DIAGNOSIS — E1151 Type 2 diabetes mellitus with diabetic peripheral angiopathy without gangrene: Secondary | ICD-10-CM | POA: Diagnosis not present

## 2021-12-04 DIAGNOSIS — J690 Pneumonitis due to inhalation of food and vomit: Secondary | ICD-10-CM | POA: Diagnosis not present

## 2021-12-04 DIAGNOSIS — E876 Hypokalemia: Secondary | ICD-10-CM | POA: Diagnosis not present

## 2021-12-04 DIAGNOSIS — E039 Hypothyroidism, unspecified: Secondary | ICD-10-CM | POA: Diagnosis not present

## 2021-12-05 DIAGNOSIS — Z9842 Cataract extraction status, left eye: Secondary | ICD-10-CM | POA: Diagnosis not present

## 2021-12-05 DIAGNOSIS — Z466 Encounter for fitting and adjustment of urinary device: Secondary | ICD-10-CM | POA: Diagnosis not present

## 2021-12-05 DIAGNOSIS — R1312 Dysphagia, oropharyngeal phase: Secondary | ICD-10-CM | POA: Diagnosis not present

## 2021-12-05 DIAGNOSIS — G20 Parkinson's disease: Secondary | ICD-10-CM | POA: Diagnosis not present

## 2021-12-05 DIAGNOSIS — J690 Pneumonitis due to inhalation of food and vomit: Secondary | ICD-10-CM | POA: Diagnosis not present

## 2021-12-05 DIAGNOSIS — Z87440 Personal history of urinary (tract) infections: Secondary | ICD-10-CM | POA: Diagnosis not present

## 2021-12-05 DIAGNOSIS — E43 Unspecified severe protein-calorie malnutrition: Secondary | ICD-10-CM | POA: Diagnosis not present

## 2021-12-05 DIAGNOSIS — R339 Retention of urine, unspecified: Secondary | ICD-10-CM | POA: Diagnosis not present

## 2021-12-05 DIAGNOSIS — E876 Hypokalemia: Secondary | ICD-10-CM | POA: Diagnosis not present

## 2021-12-05 DIAGNOSIS — Z7982 Long term (current) use of aspirin: Secondary | ICD-10-CM | POA: Diagnosis not present

## 2021-12-05 DIAGNOSIS — Z9181 History of falling: Secondary | ICD-10-CM | POA: Diagnosis not present

## 2021-12-05 DIAGNOSIS — E1151 Type 2 diabetes mellitus with diabetic peripheral angiopathy without gangrene: Secondary | ICD-10-CM | POA: Diagnosis not present

## 2021-12-05 DIAGNOSIS — E785 Hyperlipidemia, unspecified: Secondary | ICD-10-CM | POA: Diagnosis not present

## 2021-12-05 DIAGNOSIS — L89152 Pressure ulcer of sacral region, stage 2: Secondary | ICD-10-CM | POA: Diagnosis not present

## 2021-12-05 DIAGNOSIS — M47816 Spondylosis without myelopathy or radiculopathy, lumbar region: Secondary | ICD-10-CM | POA: Diagnosis not present

## 2021-12-05 DIAGNOSIS — R627 Adult failure to thrive: Secondary | ICD-10-CM | POA: Diagnosis not present

## 2021-12-05 DIAGNOSIS — D509 Iron deficiency anemia, unspecified: Secondary | ICD-10-CM | POA: Diagnosis not present

## 2021-12-05 DIAGNOSIS — Z7401 Bed confinement status: Secondary | ICD-10-CM | POA: Diagnosis not present

## 2021-12-05 DIAGNOSIS — M1990 Unspecified osteoarthritis, unspecified site: Secondary | ICD-10-CM | POA: Diagnosis not present

## 2021-12-05 DIAGNOSIS — E039 Hypothyroidism, unspecified: Secondary | ICD-10-CM | POA: Diagnosis not present

## 2021-12-05 DIAGNOSIS — Z96641 Presence of right artificial hip joint: Secondary | ICD-10-CM | POA: Diagnosis not present

## 2021-12-05 DIAGNOSIS — E871 Hypo-osmolality and hyponatremia: Secondary | ICD-10-CM | POA: Diagnosis not present

## 2021-12-05 DIAGNOSIS — K5900 Constipation, unspecified: Secondary | ICD-10-CM | POA: Diagnosis not present

## 2021-12-05 DIAGNOSIS — I10 Essential (primary) hypertension: Secondary | ICD-10-CM | POA: Diagnosis not present

## 2021-12-05 DIAGNOSIS — I2510 Atherosclerotic heart disease of native coronary artery without angina pectoris: Secondary | ICD-10-CM | POA: Diagnosis not present

## 2021-12-06 DIAGNOSIS — E039 Hypothyroidism, unspecified: Secondary | ICD-10-CM | POA: Diagnosis not present

## 2021-12-06 DIAGNOSIS — E1151 Type 2 diabetes mellitus with diabetic peripheral angiopathy without gangrene: Secondary | ICD-10-CM | POA: Diagnosis not present

## 2021-12-06 DIAGNOSIS — E876 Hypokalemia: Secondary | ICD-10-CM | POA: Diagnosis not present

## 2021-12-06 DIAGNOSIS — D509 Iron deficiency anemia, unspecified: Secondary | ICD-10-CM | POA: Diagnosis not present

## 2021-12-06 DIAGNOSIS — Z7401 Bed confinement status: Secondary | ICD-10-CM | POA: Diagnosis not present

## 2021-12-06 DIAGNOSIS — L89152 Pressure ulcer of sacral region, stage 2: Secondary | ICD-10-CM | POA: Diagnosis not present

## 2021-12-06 DIAGNOSIS — Z87440 Personal history of urinary (tract) infections: Secondary | ICD-10-CM | POA: Diagnosis not present

## 2021-12-06 DIAGNOSIS — Z96641 Presence of right artificial hip joint: Secondary | ICD-10-CM | POA: Diagnosis not present

## 2021-12-06 DIAGNOSIS — E871 Hypo-osmolality and hyponatremia: Secondary | ICD-10-CM | POA: Diagnosis not present

## 2021-12-06 DIAGNOSIS — I10 Essential (primary) hypertension: Secondary | ICD-10-CM | POA: Diagnosis not present

## 2021-12-06 DIAGNOSIS — E43 Unspecified severe protein-calorie malnutrition: Secondary | ICD-10-CM | POA: Diagnosis not present

## 2021-12-06 DIAGNOSIS — M1990 Unspecified osteoarthritis, unspecified site: Secondary | ICD-10-CM | POA: Diagnosis not present

## 2021-12-06 DIAGNOSIS — Z9842 Cataract extraction status, left eye: Secondary | ICD-10-CM | POA: Diagnosis not present

## 2021-12-06 DIAGNOSIS — R339 Retention of urine, unspecified: Secondary | ICD-10-CM | POA: Diagnosis not present

## 2021-12-06 DIAGNOSIS — J690 Pneumonitis due to inhalation of food and vomit: Secondary | ICD-10-CM | POA: Diagnosis not present

## 2021-12-06 DIAGNOSIS — Z9181 History of falling: Secondary | ICD-10-CM | POA: Diagnosis not present

## 2021-12-06 DIAGNOSIS — I2510 Atherosclerotic heart disease of native coronary artery without angina pectoris: Secondary | ICD-10-CM | POA: Diagnosis not present

## 2021-12-06 DIAGNOSIS — R627 Adult failure to thrive: Secondary | ICD-10-CM | POA: Diagnosis not present

## 2021-12-06 DIAGNOSIS — K5900 Constipation, unspecified: Secondary | ICD-10-CM | POA: Diagnosis not present

## 2021-12-06 DIAGNOSIS — G20 Parkinson's disease: Secondary | ICD-10-CM | POA: Diagnosis not present

## 2021-12-06 DIAGNOSIS — R1312 Dysphagia, oropharyngeal phase: Secondary | ICD-10-CM | POA: Diagnosis not present

## 2021-12-06 DIAGNOSIS — L89154 Pressure ulcer of sacral region, stage 4: Secondary | ICD-10-CM | POA: Diagnosis not present

## 2021-12-06 DIAGNOSIS — E785 Hyperlipidemia, unspecified: Secondary | ICD-10-CM | POA: Diagnosis not present

## 2021-12-06 DIAGNOSIS — Z466 Encounter for fitting and adjustment of urinary device: Secondary | ICD-10-CM | POA: Diagnosis not present

## 2021-12-06 DIAGNOSIS — Z7982 Long term (current) use of aspirin: Secondary | ICD-10-CM | POA: Diagnosis not present

## 2021-12-10 DIAGNOSIS — L89152 Pressure ulcer of sacral region, stage 2: Secondary | ICD-10-CM | POA: Diagnosis not present

## 2021-12-10 DIAGNOSIS — Z9842 Cataract extraction status, left eye: Secondary | ICD-10-CM | POA: Diagnosis not present

## 2021-12-10 DIAGNOSIS — Z96641 Presence of right artificial hip joint: Secondary | ICD-10-CM | POA: Diagnosis not present

## 2021-12-10 DIAGNOSIS — J690 Pneumonitis due to inhalation of food and vomit: Secondary | ICD-10-CM | POA: Diagnosis not present

## 2021-12-10 DIAGNOSIS — D509 Iron deficiency anemia, unspecified: Secondary | ICD-10-CM | POA: Diagnosis not present

## 2021-12-10 DIAGNOSIS — E039 Hypothyroidism, unspecified: Secondary | ICD-10-CM | POA: Diagnosis not present

## 2021-12-10 DIAGNOSIS — R1312 Dysphagia, oropharyngeal phase: Secondary | ICD-10-CM | POA: Diagnosis not present

## 2021-12-10 DIAGNOSIS — E43 Unspecified severe protein-calorie malnutrition: Secondary | ICD-10-CM | POA: Diagnosis not present

## 2021-12-10 DIAGNOSIS — M1990 Unspecified osteoarthritis, unspecified site: Secondary | ICD-10-CM | POA: Diagnosis not present

## 2021-12-10 DIAGNOSIS — E876 Hypokalemia: Secondary | ICD-10-CM | POA: Diagnosis not present

## 2021-12-10 DIAGNOSIS — Z7401 Bed confinement status: Secondary | ICD-10-CM | POA: Diagnosis not present

## 2021-12-10 DIAGNOSIS — G20 Parkinson's disease: Secondary | ICD-10-CM | POA: Diagnosis not present

## 2021-12-10 DIAGNOSIS — I2510 Atherosclerotic heart disease of native coronary artery without angina pectoris: Secondary | ICD-10-CM | POA: Diagnosis not present

## 2021-12-10 DIAGNOSIS — Z9181 History of falling: Secondary | ICD-10-CM | POA: Diagnosis not present

## 2021-12-10 DIAGNOSIS — E871 Hypo-osmolality and hyponatremia: Secondary | ICD-10-CM | POA: Diagnosis not present

## 2021-12-10 DIAGNOSIS — E1151 Type 2 diabetes mellitus with diabetic peripheral angiopathy without gangrene: Secondary | ICD-10-CM | POA: Diagnosis not present

## 2021-12-10 DIAGNOSIS — R339 Retention of urine, unspecified: Secondary | ICD-10-CM | POA: Diagnosis not present

## 2021-12-10 DIAGNOSIS — Z7982 Long term (current) use of aspirin: Secondary | ICD-10-CM | POA: Diagnosis not present

## 2021-12-10 DIAGNOSIS — R627 Adult failure to thrive: Secondary | ICD-10-CM | POA: Diagnosis not present

## 2021-12-10 DIAGNOSIS — Z466 Encounter for fitting and adjustment of urinary device: Secondary | ICD-10-CM | POA: Diagnosis not present

## 2021-12-10 DIAGNOSIS — K5900 Constipation, unspecified: Secondary | ICD-10-CM | POA: Diagnosis not present

## 2021-12-10 DIAGNOSIS — Z87440 Personal history of urinary (tract) infections: Secondary | ICD-10-CM | POA: Diagnosis not present

## 2021-12-10 DIAGNOSIS — I10 Essential (primary) hypertension: Secondary | ICD-10-CM | POA: Diagnosis not present

## 2021-12-10 DIAGNOSIS — E785 Hyperlipidemia, unspecified: Secondary | ICD-10-CM | POA: Diagnosis not present

## 2021-12-12 DIAGNOSIS — Z9181 History of falling: Secondary | ICD-10-CM | POA: Diagnosis not present

## 2021-12-12 DIAGNOSIS — Z7401 Bed confinement status: Secondary | ICD-10-CM | POA: Diagnosis not present

## 2021-12-12 DIAGNOSIS — L89152 Pressure ulcer of sacral region, stage 2: Secondary | ICD-10-CM | POA: Diagnosis not present

## 2021-12-12 DIAGNOSIS — D509 Iron deficiency anemia, unspecified: Secondary | ICD-10-CM | POA: Diagnosis not present

## 2021-12-12 DIAGNOSIS — E1151 Type 2 diabetes mellitus with diabetic peripheral angiopathy without gangrene: Secondary | ICD-10-CM | POA: Diagnosis not present

## 2021-12-12 DIAGNOSIS — E785 Hyperlipidemia, unspecified: Secondary | ICD-10-CM | POA: Diagnosis not present

## 2021-12-12 DIAGNOSIS — G20 Parkinson's disease: Secondary | ICD-10-CM | POA: Diagnosis not present

## 2021-12-12 DIAGNOSIS — E039 Hypothyroidism, unspecified: Secondary | ICD-10-CM | POA: Diagnosis not present

## 2021-12-12 DIAGNOSIS — Z87440 Personal history of urinary (tract) infections: Secondary | ICD-10-CM | POA: Diagnosis not present

## 2021-12-12 DIAGNOSIS — E871 Hypo-osmolality and hyponatremia: Secondary | ICD-10-CM | POA: Diagnosis not present

## 2021-12-12 DIAGNOSIS — Z7982 Long term (current) use of aspirin: Secondary | ICD-10-CM | POA: Diagnosis not present

## 2021-12-12 DIAGNOSIS — J690 Pneumonitis due to inhalation of food and vomit: Secondary | ICD-10-CM | POA: Diagnosis not present

## 2021-12-12 DIAGNOSIS — R627 Adult failure to thrive: Secondary | ICD-10-CM | POA: Diagnosis not present

## 2021-12-12 DIAGNOSIS — R339 Retention of urine, unspecified: Secondary | ICD-10-CM | POA: Diagnosis not present

## 2021-12-12 DIAGNOSIS — Z96641 Presence of right artificial hip joint: Secondary | ICD-10-CM | POA: Diagnosis not present

## 2021-12-12 DIAGNOSIS — E876 Hypokalemia: Secondary | ICD-10-CM | POA: Diagnosis not present

## 2021-12-12 DIAGNOSIS — Z9842 Cataract extraction status, left eye: Secondary | ICD-10-CM | POA: Diagnosis not present

## 2021-12-12 DIAGNOSIS — K5900 Constipation, unspecified: Secondary | ICD-10-CM | POA: Diagnosis not present

## 2021-12-12 DIAGNOSIS — Z466 Encounter for fitting and adjustment of urinary device: Secondary | ICD-10-CM | POA: Diagnosis not present

## 2021-12-12 DIAGNOSIS — R1312 Dysphagia, oropharyngeal phase: Secondary | ICD-10-CM | POA: Diagnosis not present

## 2021-12-12 DIAGNOSIS — I10 Essential (primary) hypertension: Secondary | ICD-10-CM | POA: Diagnosis not present

## 2021-12-12 DIAGNOSIS — M1990 Unspecified osteoarthritis, unspecified site: Secondary | ICD-10-CM | POA: Diagnosis not present

## 2021-12-12 DIAGNOSIS — I2510 Atherosclerotic heart disease of native coronary artery without angina pectoris: Secondary | ICD-10-CM | POA: Diagnosis not present

## 2021-12-12 DIAGNOSIS — E43 Unspecified severe protein-calorie malnutrition: Secondary | ICD-10-CM | POA: Diagnosis not present

## 2021-12-13 DIAGNOSIS — Z87440 Personal history of urinary (tract) infections: Secondary | ICD-10-CM | POA: Diagnosis not present

## 2021-12-13 DIAGNOSIS — K5900 Constipation, unspecified: Secondary | ICD-10-CM | POA: Diagnosis not present

## 2021-12-13 DIAGNOSIS — E039 Hypothyroidism, unspecified: Secondary | ICD-10-CM | POA: Diagnosis not present

## 2021-12-13 DIAGNOSIS — G20 Parkinson's disease: Secondary | ICD-10-CM | POA: Diagnosis not present

## 2021-12-13 DIAGNOSIS — Z9842 Cataract extraction status, left eye: Secondary | ICD-10-CM | POA: Diagnosis not present

## 2021-12-13 DIAGNOSIS — I10 Essential (primary) hypertension: Secondary | ICD-10-CM | POA: Diagnosis not present

## 2021-12-13 DIAGNOSIS — M1990 Unspecified osteoarthritis, unspecified site: Secondary | ICD-10-CM | POA: Diagnosis not present

## 2021-12-13 DIAGNOSIS — E1151 Type 2 diabetes mellitus with diabetic peripheral angiopathy without gangrene: Secondary | ICD-10-CM | POA: Diagnosis not present

## 2021-12-13 DIAGNOSIS — D509 Iron deficiency anemia, unspecified: Secondary | ICD-10-CM | POA: Diagnosis not present

## 2021-12-13 DIAGNOSIS — Z466 Encounter for fitting and adjustment of urinary device: Secondary | ICD-10-CM | POA: Diagnosis not present

## 2021-12-13 DIAGNOSIS — E785 Hyperlipidemia, unspecified: Secondary | ICD-10-CM | POA: Diagnosis not present

## 2021-12-13 DIAGNOSIS — J690 Pneumonitis due to inhalation of food and vomit: Secondary | ICD-10-CM | POA: Diagnosis not present

## 2021-12-13 DIAGNOSIS — Z7401 Bed confinement status: Secondary | ICD-10-CM | POA: Diagnosis not present

## 2021-12-13 DIAGNOSIS — L89152 Pressure ulcer of sacral region, stage 2: Secondary | ICD-10-CM | POA: Diagnosis not present

## 2021-12-13 DIAGNOSIS — E876 Hypokalemia: Secondary | ICD-10-CM | POA: Diagnosis not present

## 2021-12-13 DIAGNOSIS — E871 Hypo-osmolality and hyponatremia: Secondary | ICD-10-CM | POA: Diagnosis not present

## 2021-12-13 DIAGNOSIS — Z9181 History of falling: Secondary | ICD-10-CM | POA: Diagnosis not present

## 2021-12-13 DIAGNOSIS — E43 Unspecified severe protein-calorie malnutrition: Secondary | ICD-10-CM | POA: Diagnosis not present

## 2021-12-13 DIAGNOSIS — Z96641 Presence of right artificial hip joint: Secondary | ICD-10-CM | POA: Diagnosis not present

## 2021-12-13 DIAGNOSIS — R1312 Dysphagia, oropharyngeal phase: Secondary | ICD-10-CM | POA: Diagnosis not present

## 2021-12-13 DIAGNOSIS — I2510 Atherosclerotic heart disease of native coronary artery without angina pectoris: Secondary | ICD-10-CM | POA: Diagnosis not present

## 2021-12-13 DIAGNOSIS — R627 Adult failure to thrive: Secondary | ICD-10-CM | POA: Diagnosis not present

## 2021-12-13 DIAGNOSIS — R339 Retention of urine, unspecified: Secondary | ICD-10-CM | POA: Diagnosis not present

## 2021-12-13 DIAGNOSIS — Z7982 Long term (current) use of aspirin: Secondary | ICD-10-CM | POA: Diagnosis not present

## 2021-12-14 DIAGNOSIS — E876 Hypokalemia: Secondary | ICD-10-CM | POA: Diagnosis not present

## 2021-12-14 DIAGNOSIS — G20 Parkinson's disease: Secondary | ICD-10-CM | POA: Diagnosis not present

## 2021-12-14 DIAGNOSIS — D509 Iron deficiency anemia, unspecified: Secondary | ICD-10-CM | POA: Diagnosis not present

## 2021-12-14 DIAGNOSIS — R1312 Dysphagia, oropharyngeal phase: Secondary | ICD-10-CM | POA: Diagnosis not present

## 2021-12-14 DIAGNOSIS — E43 Unspecified severe protein-calorie malnutrition: Secondary | ICD-10-CM | POA: Diagnosis not present

## 2021-12-14 DIAGNOSIS — J690 Pneumonitis due to inhalation of food and vomit: Secondary | ICD-10-CM | POA: Diagnosis not present

## 2021-12-14 DIAGNOSIS — K5900 Constipation, unspecified: Secondary | ICD-10-CM | POA: Diagnosis not present

## 2021-12-14 DIAGNOSIS — Z7401 Bed confinement status: Secondary | ICD-10-CM | POA: Diagnosis not present

## 2021-12-14 DIAGNOSIS — Z9181 History of falling: Secondary | ICD-10-CM | POA: Diagnosis not present

## 2021-12-14 DIAGNOSIS — I2510 Atherosclerotic heart disease of native coronary artery without angina pectoris: Secondary | ICD-10-CM | POA: Diagnosis not present

## 2021-12-14 DIAGNOSIS — Z87440 Personal history of urinary (tract) infections: Secondary | ICD-10-CM | POA: Diagnosis not present

## 2021-12-14 DIAGNOSIS — I10 Essential (primary) hypertension: Secondary | ICD-10-CM | POA: Diagnosis not present

## 2021-12-14 DIAGNOSIS — R339 Retention of urine, unspecified: Secondary | ICD-10-CM | POA: Diagnosis not present

## 2021-12-14 DIAGNOSIS — E1151 Type 2 diabetes mellitus with diabetic peripheral angiopathy without gangrene: Secondary | ICD-10-CM | POA: Diagnosis not present

## 2021-12-14 DIAGNOSIS — M1990 Unspecified osteoarthritis, unspecified site: Secondary | ICD-10-CM | POA: Diagnosis not present

## 2021-12-14 DIAGNOSIS — Z9842 Cataract extraction status, left eye: Secondary | ICD-10-CM | POA: Diagnosis not present

## 2021-12-14 DIAGNOSIS — Z96641 Presence of right artificial hip joint: Secondary | ICD-10-CM | POA: Diagnosis not present

## 2021-12-14 DIAGNOSIS — E785 Hyperlipidemia, unspecified: Secondary | ICD-10-CM | POA: Diagnosis not present

## 2021-12-14 DIAGNOSIS — R627 Adult failure to thrive: Secondary | ICD-10-CM | POA: Diagnosis not present

## 2021-12-14 DIAGNOSIS — L89152 Pressure ulcer of sacral region, stage 2: Secondary | ICD-10-CM | POA: Diagnosis not present

## 2021-12-14 DIAGNOSIS — Z7982 Long term (current) use of aspirin: Secondary | ICD-10-CM | POA: Diagnosis not present

## 2021-12-14 DIAGNOSIS — Z466 Encounter for fitting and adjustment of urinary device: Secondary | ICD-10-CM | POA: Diagnosis not present

## 2021-12-14 DIAGNOSIS — E871 Hypo-osmolality and hyponatremia: Secondary | ICD-10-CM | POA: Diagnosis not present

## 2021-12-14 DIAGNOSIS — E039 Hypothyroidism, unspecified: Secondary | ICD-10-CM | POA: Diagnosis not present

## 2021-12-17 ENCOUNTER — Ambulatory Visit (INDEPENDENT_AMBULATORY_CARE_PROVIDER_SITE_OTHER): Payer: Medicare Other

## 2021-12-17 DIAGNOSIS — Z87440 Personal history of urinary (tract) infections: Secondary | ICD-10-CM | POA: Diagnosis not present

## 2021-12-17 DIAGNOSIS — L89152 Pressure ulcer of sacral region, stage 2: Secondary | ICD-10-CM | POA: Diagnosis not present

## 2021-12-17 DIAGNOSIS — E876 Hypokalemia: Secondary | ICD-10-CM | POA: Diagnosis not present

## 2021-12-17 DIAGNOSIS — E871 Hypo-osmolality and hyponatremia: Secondary | ICD-10-CM | POA: Diagnosis not present

## 2021-12-17 DIAGNOSIS — G20 Parkinson's disease: Secondary | ICD-10-CM | POA: Diagnosis not present

## 2021-12-17 DIAGNOSIS — Z7982 Long term (current) use of aspirin: Secondary | ICD-10-CM | POA: Diagnosis not present

## 2021-12-17 DIAGNOSIS — R55 Syncope and collapse: Secondary | ICD-10-CM | POA: Diagnosis not present

## 2021-12-17 DIAGNOSIS — E039 Hypothyroidism, unspecified: Secondary | ICD-10-CM | POA: Diagnosis not present

## 2021-12-17 DIAGNOSIS — R339 Retention of urine, unspecified: Secondary | ICD-10-CM | POA: Diagnosis not present

## 2021-12-17 DIAGNOSIS — I2510 Atherosclerotic heart disease of native coronary artery without angina pectoris: Secondary | ICD-10-CM | POA: Diagnosis not present

## 2021-12-17 DIAGNOSIS — Z9842 Cataract extraction status, left eye: Secondary | ICD-10-CM | POA: Diagnosis not present

## 2021-12-17 DIAGNOSIS — K5900 Constipation, unspecified: Secondary | ICD-10-CM | POA: Diagnosis not present

## 2021-12-17 DIAGNOSIS — R1312 Dysphagia, oropharyngeal phase: Secondary | ICD-10-CM | POA: Diagnosis not present

## 2021-12-17 DIAGNOSIS — D509 Iron deficiency anemia, unspecified: Secondary | ICD-10-CM | POA: Diagnosis not present

## 2021-12-17 DIAGNOSIS — Z466 Encounter for fitting and adjustment of urinary device: Secondary | ICD-10-CM | POA: Diagnosis not present

## 2021-12-17 DIAGNOSIS — E785 Hyperlipidemia, unspecified: Secondary | ICD-10-CM | POA: Diagnosis not present

## 2021-12-17 DIAGNOSIS — E1151 Type 2 diabetes mellitus with diabetic peripheral angiopathy without gangrene: Secondary | ICD-10-CM | POA: Diagnosis not present

## 2021-12-17 DIAGNOSIS — J690 Pneumonitis due to inhalation of food and vomit: Secondary | ICD-10-CM | POA: Diagnosis not present

## 2021-12-17 DIAGNOSIS — I10 Essential (primary) hypertension: Secondary | ICD-10-CM | POA: Diagnosis not present

## 2021-12-17 DIAGNOSIS — M1990 Unspecified osteoarthritis, unspecified site: Secondary | ICD-10-CM | POA: Diagnosis not present

## 2021-12-17 DIAGNOSIS — Z96641 Presence of right artificial hip joint: Secondary | ICD-10-CM | POA: Diagnosis not present

## 2021-12-17 DIAGNOSIS — Z7401 Bed confinement status: Secondary | ICD-10-CM | POA: Diagnosis not present

## 2021-12-17 DIAGNOSIS — Z9181 History of falling: Secondary | ICD-10-CM | POA: Diagnosis not present

## 2021-12-17 DIAGNOSIS — R627 Adult failure to thrive: Secondary | ICD-10-CM | POA: Diagnosis not present

## 2021-12-17 DIAGNOSIS — E43 Unspecified severe protein-calorie malnutrition: Secondary | ICD-10-CM | POA: Diagnosis not present

## 2021-12-18 LAB — CUP PACEART REMOTE DEVICE CHECK
Date Time Interrogation Session: 20230919080817
Implantable Pulse Generator Implant Date: 20210830

## 2021-12-19 DIAGNOSIS — Z96641 Presence of right artificial hip joint: Secondary | ICD-10-CM | POA: Diagnosis not present

## 2021-12-19 DIAGNOSIS — J690 Pneumonitis due to inhalation of food and vomit: Secondary | ICD-10-CM | POA: Diagnosis not present

## 2021-12-19 DIAGNOSIS — L89152 Pressure ulcer of sacral region, stage 2: Secondary | ICD-10-CM | POA: Diagnosis not present

## 2021-12-19 DIAGNOSIS — R1312 Dysphagia, oropharyngeal phase: Secondary | ICD-10-CM | POA: Diagnosis not present

## 2021-12-19 DIAGNOSIS — Z9181 History of falling: Secondary | ICD-10-CM | POA: Diagnosis not present

## 2021-12-19 DIAGNOSIS — Z87440 Personal history of urinary (tract) infections: Secondary | ICD-10-CM | POA: Diagnosis not present

## 2021-12-19 DIAGNOSIS — E1151 Type 2 diabetes mellitus with diabetic peripheral angiopathy without gangrene: Secondary | ICD-10-CM | POA: Diagnosis not present

## 2021-12-19 DIAGNOSIS — E785 Hyperlipidemia, unspecified: Secondary | ICD-10-CM | POA: Diagnosis not present

## 2021-12-19 DIAGNOSIS — Z7982 Long term (current) use of aspirin: Secondary | ICD-10-CM | POA: Diagnosis not present

## 2021-12-19 DIAGNOSIS — R339 Retention of urine, unspecified: Secondary | ICD-10-CM | POA: Diagnosis not present

## 2021-12-19 DIAGNOSIS — Z466 Encounter for fitting and adjustment of urinary device: Secondary | ICD-10-CM | POA: Diagnosis not present

## 2021-12-19 DIAGNOSIS — K5900 Constipation, unspecified: Secondary | ICD-10-CM | POA: Diagnosis not present

## 2021-12-19 DIAGNOSIS — I10 Essential (primary) hypertension: Secondary | ICD-10-CM | POA: Diagnosis not present

## 2021-12-19 DIAGNOSIS — I2510 Atherosclerotic heart disease of native coronary artery without angina pectoris: Secondary | ICD-10-CM | POA: Diagnosis not present

## 2021-12-19 DIAGNOSIS — E039 Hypothyroidism, unspecified: Secondary | ICD-10-CM | POA: Diagnosis not present

## 2021-12-19 DIAGNOSIS — E876 Hypokalemia: Secondary | ICD-10-CM | POA: Diagnosis not present

## 2021-12-19 DIAGNOSIS — R627 Adult failure to thrive: Secondary | ICD-10-CM | POA: Diagnosis not present

## 2021-12-19 DIAGNOSIS — M1990 Unspecified osteoarthritis, unspecified site: Secondary | ICD-10-CM | POA: Diagnosis not present

## 2021-12-19 DIAGNOSIS — G20 Parkinson's disease: Secondary | ICD-10-CM | POA: Diagnosis not present

## 2021-12-19 DIAGNOSIS — Z9842 Cataract extraction status, left eye: Secondary | ICD-10-CM | POA: Diagnosis not present

## 2021-12-19 DIAGNOSIS — D509 Iron deficiency anemia, unspecified: Secondary | ICD-10-CM | POA: Diagnosis not present

## 2021-12-19 DIAGNOSIS — E871 Hypo-osmolality and hyponatremia: Secondary | ICD-10-CM | POA: Diagnosis not present

## 2021-12-19 DIAGNOSIS — E43 Unspecified severe protein-calorie malnutrition: Secondary | ICD-10-CM | POA: Diagnosis not present

## 2021-12-19 DIAGNOSIS — Z7401 Bed confinement status: Secondary | ICD-10-CM | POA: Diagnosis not present

## 2021-12-20 DIAGNOSIS — Z87440 Personal history of urinary (tract) infections: Secondary | ICD-10-CM | POA: Diagnosis not present

## 2021-12-20 DIAGNOSIS — E871 Hypo-osmolality and hyponatremia: Secondary | ICD-10-CM | POA: Diagnosis not present

## 2021-12-20 DIAGNOSIS — D509 Iron deficiency anemia, unspecified: Secondary | ICD-10-CM | POA: Diagnosis not present

## 2021-12-20 DIAGNOSIS — R339 Retention of urine, unspecified: Secondary | ICD-10-CM | POA: Diagnosis not present

## 2021-12-20 DIAGNOSIS — I2510 Atherosclerotic heart disease of native coronary artery without angina pectoris: Secondary | ICD-10-CM | POA: Diagnosis not present

## 2021-12-20 DIAGNOSIS — E1151 Type 2 diabetes mellitus with diabetic peripheral angiopathy without gangrene: Secondary | ICD-10-CM | POA: Diagnosis not present

## 2021-12-20 DIAGNOSIS — E039 Hypothyroidism, unspecified: Secondary | ICD-10-CM | POA: Diagnosis not present

## 2021-12-20 DIAGNOSIS — Z9181 History of falling: Secondary | ICD-10-CM | POA: Diagnosis not present

## 2021-12-20 DIAGNOSIS — Z7982 Long term (current) use of aspirin: Secondary | ICD-10-CM | POA: Diagnosis not present

## 2021-12-20 DIAGNOSIS — K5900 Constipation, unspecified: Secondary | ICD-10-CM | POA: Diagnosis not present

## 2021-12-20 DIAGNOSIS — Z9842 Cataract extraction status, left eye: Secondary | ICD-10-CM | POA: Diagnosis not present

## 2021-12-20 DIAGNOSIS — E876 Hypokalemia: Secondary | ICD-10-CM | POA: Diagnosis not present

## 2021-12-20 DIAGNOSIS — I10 Essential (primary) hypertension: Secondary | ICD-10-CM | POA: Diagnosis not present

## 2021-12-20 DIAGNOSIS — G20 Parkinson's disease: Secondary | ICD-10-CM | POA: Diagnosis not present

## 2021-12-20 DIAGNOSIS — E43 Unspecified severe protein-calorie malnutrition: Secondary | ICD-10-CM | POA: Diagnosis not present

## 2021-12-20 DIAGNOSIS — J690 Pneumonitis due to inhalation of food and vomit: Secondary | ICD-10-CM | POA: Diagnosis not present

## 2021-12-20 DIAGNOSIS — E785 Hyperlipidemia, unspecified: Secondary | ICD-10-CM | POA: Diagnosis not present

## 2021-12-20 DIAGNOSIS — Z466 Encounter for fitting and adjustment of urinary device: Secondary | ICD-10-CM | POA: Diagnosis not present

## 2021-12-20 DIAGNOSIS — Z96641 Presence of right artificial hip joint: Secondary | ICD-10-CM | POA: Diagnosis not present

## 2021-12-20 DIAGNOSIS — L89152 Pressure ulcer of sacral region, stage 2: Secondary | ICD-10-CM | POA: Diagnosis not present

## 2021-12-20 DIAGNOSIS — R627 Adult failure to thrive: Secondary | ICD-10-CM | POA: Diagnosis not present

## 2021-12-20 DIAGNOSIS — M1990 Unspecified osteoarthritis, unspecified site: Secondary | ICD-10-CM | POA: Diagnosis not present

## 2021-12-20 DIAGNOSIS — Z7401 Bed confinement status: Secondary | ICD-10-CM | POA: Diagnosis not present

## 2021-12-20 DIAGNOSIS — R1312 Dysphagia, oropharyngeal phase: Secondary | ICD-10-CM | POA: Diagnosis not present

## 2021-12-21 DIAGNOSIS — R1312 Dysphagia, oropharyngeal phase: Secondary | ICD-10-CM | POA: Diagnosis not present

## 2021-12-21 DIAGNOSIS — Z9181 History of falling: Secondary | ICD-10-CM | POA: Diagnosis not present

## 2021-12-21 DIAGNOSIS — J690 Pneumonitis due to inhalation of food and vomit: Secondary | ICD-10-CM | POA: Diagnosis not present

## 2021-12-21 DIAGNOSIS — D509 Iron deficiency anemia, unspecified: Secondary | ICD-10-CM | POA: Diagnosis not present

## 2021-12-21 DIAGNOSIS — I10 Essential (primary) hypertension: Secondary | ICD-10-CM | POA: Diagnosis not present

## 2021-12-21 DIAGNOSIS — Z466 Encounter for fitting and adjustment of urinary device: Secondary | ICD-10-CM | POA: Diagnosis not present

## 2021-12-21 DIAGNOSIS — Z7982 Long term (current) use of aspirin: Secondary | ICD-10-CM | POA: Diagnosis not present

## 2021-12-21 DIAGNOSIS — R339 Retention of urine, unspecified: Secondary | ICD-10-CM | POA: Diagnosis not present

## 2021-12-21 DIAGNOSIS — E876 Hypokalemia: Secondary | ICD-10-CM | POA: Diagnosis not present

## 2021-12-21 DIAGNOSIS — E1151 Type 2 diabetes mellitus with diabetic peripheral angiopathy without gangrene: Secondary | ICD-10-CM | POA: Diagnosis not present

## 2021-12-21 DIAGNOSIS — Z7401 Bed confinement status: Secondary | ICD-10-CM | POA: Diagnosis not present

## 2021-12-21 DIAGNOSIS — M1990 Unspecified osteoarthritis, unspecified site: Secondary | ICD-10-CM | POA: Diagnosis not present

## 2021-12-21 DIAGNOSIS — Z9842 Cataract extraction status, left eye: Secondary | ICD-10-CM | POA: Diagnosis not present

## 2021-12-21 DIAGNOSIS — Z87440 Personal history of urinary (tract) infections: Secondary | ICD-10-CM | POA: Diagnosis not present

## 2021-12-21 DIAGNOSIS — Z96641 Presence of right artificial hip joint: Secondary | ICD-10-CM | POA: Diagnosis not present

## 2021-12-21 DIAGNOSIS — E43 Unspecified severe protein-calorie malnutrition: Secondary | ICD-10-CM | POA: Diagnosis not present

## 2021-12-21 DIAGNOSIS — E039 Hypothyroidism, unspecified: Secondary | ICD-10-CM | POA: Diagnosis not present

## 2021-12-21 DIAGNOSIS — L89152 Pressure ulcer of sacral region, stage 2: Secondary | ICD-10-CM | POA: Diagnosis not present

## 2021-12-21 DIAGNOSIS — G20 Parkinson's disease: Secondary | ICD-10-CM | POA: Diagnosis not present

## 2021-12-21 DIAGNOSIS — I2510 Atherosclerotic heart disease of native coronary artery without angina pectoris: Secondary | ICD-10-CM | POA: Diagnosis not present

## 2021-12-21 DIAGNOSIS — E785 Hyperlipidemia, unspecified: Secondary | ICD-10-CM | POA: Diagnosis not present

## 2021-12-21 DIAGNOSIS — K5900 Constipation, unspecified: Secondary | ICD-10-CM | POA: Diagnosis not present

## 2021-12-21 DIAGNOSIS — E871 Hypo-osmolality and hyponatremia: Secondary | ICD-10-CM | POA: Diagnosis not present

## 2021-12-21 DIAGNOSIS — R627 Adult failure to thrive: Secondary | ICD-10-CM | POA: Diagnosis not present

## 2021-12-24 DIAGNOSIS — E785 Hyperlipidemia, unspecified: Secondary | ICD-10-CM | POA: Diagnosis not present

## 2021-12-24 DIAGNOSIS — E876 Hypokalemia: Secondary | ICD-10-CM | POA: Diagnosis not present

## 2021-12-24 DIAGNOSIS — Z96641 Presence of right artificial hip joint: Secondary | ICD-10-CM | POA: Diagnosis not present

## 2021-12-24 DIAGNOSIS — R627 Adult failure to thrive: Secondary | ICD-10-CM | POA: Diagnosis not present

## 2021-12-24 DIAGNOSIS — Z9842 Cataract extraction status, left eye: Secondary | ICD-10-CM | POA: Diagnosis not present

## 2021-12-24 DIAGNOSIS — E1151 Type 2 diabetes mellitus with diabetic peripheral angiopathy without gangrene: Secondary | ICD-10-CM | POA: Diagnosis not present

## 2021-12-24 DIAGNOSIS — E871 Hypo-osmolality and hyponatremia: Secondary | ICD-10-CM | POA: Diagnosis not present

## 2021-12-24 DIAGNOSIS — E039 Hypothyroidism, unspecified: Secondary | ICD-10-CM | POA: Diagnosis not present

## 2021-12-24 DIAGNOSIS — G20 Parkinson's disease: Secondary | ICD-10-CM | POA: Diagnosis not present

## 2021-12-24 DIAGNOSIS — Z9181 History of falling: Secondary | ICD-10-CM | POA: Diagnosis not present

## 2021-12-24 DIAGNOSIS — M1990 Unspecified osteoarthritis, unspecified site: Secondary | ICD-10-CM | POA: Diagnosis not present

## 2021-12-24 DIAGNOSIS — R1312 Dysphagia, oropharyngeal phase: Secondary | ICD-10-CM | POA: Diagnosis not present

## 2021-12-24 DIAGNOSIS — Z87440 Personal history of urinary (tract) infections: Secondary | ICD-10-CM | POA: Diagnosis not present

## 2021-12-24 DIAGNOSIS — Z7982 Long term (current) use of aspirin: Secondary | ICD-10-CM | POA: Diagnosis not present

## 2021-12-24 DIAGNOSIS — J690 Pneumonitis due to inhalation of food and vomit: Secondary | ICD-10-CM | POA: Diagnosis not present

## 2021-12-24 DIAGNOSIS — D509 Iron deficiency anemia, unspecified: Secondary | ICD-10-CM | POA: Diagnosis not present

## 2021-12-24 DIAGNOSIS — K5900 Constipation, unspecified: Secondary | ICD-10-CM | POA: Diagnosis not present

## 2021-12-24 DIAGNOSIS — L89152 Pressure ulcer of sacral region, stage 2: Secondary | ICD-10-CM | POA: Diagnosis not present

## 2021-12-24 DIAGNOSIS — R339 Retention of urine, unspecified: Secondary | ICD-10-CM | POA: Diagnosis not present

## 2021-12-24 DIAGNOSIS — Z466 Encounter for fitting and adjustment of urinary device: Secondary | ICD-10-CM | POA: Diagnosis not present

## 2021-12-24 DIAGNOSIS — Z7401 Bed confinement status: Secondary | ICD-10-CM | POA: Diagnosis not present

## 2021-12-24 DIAGNOSIS — I10 Essential (primary) hypertension: Secondary | ICD-10-CM | POA: Diagnosis not present

## 2021-12-24 DIAGNOSIS — I2510 Atherosclerotic heart disease of native coronary artery without angina pectoris: Secondary | ICD-10-CM | POA: Diagnosis not present

## 2021-12-24 DIAGNOSIS — E43 Unspecified severe protein-calorie malnutrition: Secondary | ICD-10-CM | POA: Diagnosis not present

## 2021-12-25 DIAGNOSIS — E785 Hyperlipidemia, unspecified: Secondary | ICD-10-CM | POA: Diagnosis not present

## 2021-12-25 DIAGNOSIS — M1990 Unspecified osteoarthritis, unspecified site: Secondary | ICD-10-CM | POA: Diagnosis not present

## 2021-12-25 DIAGNOSIS — E871 Hypo-osmolality and hyponatremia: Secondary | ICD-10-CM | POA: Diagnosis not present

## 2021-12-25 DIAGNOSIS — Z7401 Bed confinement status: Secondary | ICD-10-CM | POA: Diagnosis not present

## 2021-12-25 DIAGNOSIS — E43 Unspecified severe protein-calorie malnutrition: Secondary | ICD-10-CM | POA: Diagnosis not present

## 2021-12-25 DIAGNOSIS — Z9842 Cataract extraction status, left eye: Secondary | ICD-10-CM | POA: Diagnosis not present

## 2021-12-25 DIAGNOSIS — E876 Hypokalemia: Secondary | ICD-10-CM | POA: Diagnosis not present

## 2021-12-25 DIAGNOSIS — E1151 Type 2 diabetes mellitus with diabetic peripheral angiopathy without gangrene: Secondary | ICD-10-CM | POA: Diagnosis not present

## 2021-12-25 DIAGNOSIS — Z7982 Long term (current) use of aspirin: Secondary | ICD-10-CM | POA: Diagnosis not present

## 2021-12-25 DIAGNOSIS — R1312 Dysphagia, oropharyngeal phase: Secondary | ICD-10-CM | POA: Diagnosis not present

## 2021-12-25 DIAGNOSIS — Z87440 Personal history of urinary (tract) infections: Secondary | ICD-10-CM | POA: Diagnosis not present

## 2021-12-25 DIAGNOSIS — R627 Adult failure to thrive: Secondary | ICD-10-CM | POA: Diagnosis not present

## 2021-12-25 DIAGNOSIS — K5900 Constipation, unspecified: Secondary | ICD-10-CM | POA: Diagnosis not present

## 2021-12-25 DIAGNOSIS — I10 Essential (primary) hypertension: Secondary | ICD-10-CM | POA: Diagnosis not present

## 2021-12-25 DIAGNOSIS — D509 Iron deficiency anemia, unspecified: Secondary | ICD-10-CM | POA: Diagnosis not present

## 2021-12-25 DIAGNOSIS — G20 Parkinson's disease: Secondary | ICD-10-CM | POA: Diagnosis not present

## 2021-12-25 DIAGNOSIS — Z9181 History of falling: Secondary | ICD-10-CM | POA: Diagnosis not present

## 2021-12-25 DIAGNOSIS — I2510 Atherosclerotic heart disease of native coronary artery without angina pectoris: Secondary | ICD-10-CM | POA: Diagnosis not present

## 2021-12-25 DIAGNOSIS — Z96641 Presence of right artificial hip joint: Secondary | ICD-10-CM | POA: Diagnosis not present

## 2021-12-25 DIAGNOSIS — R339 Retention of urine, unspecified: Secondary | ICD-10-CM | POA: Diagnosis not present

## 2021-12-25 DIAGNOSIS — E039 Hypothyroidism, unspecified: Secondary | ICD-10-CM | POA: Diagnosis not present

## 2021-12-25 DIAGNOSIS — J690 Pneumonitis due to inhalation of food and vomit: Secondary | ICD-10-CM | POA: Diagnosis not present

## 2021-12-25 DIAGNOSIS — L89152 Pressure ulcer of sacral region, stage 2: Secondary | ICD-10-CM | POA: Diagnosis not present

## 2021-12-25 DIAGNOSIS — Z466 Encounter for fitting and adjustment of urinary device: Secondary | ICD-10-CM | POA: Diagnosis not present

## 2021-12-26 DIAGNOSIS — Z7982 Long term (current) use of aspirin: Secondary | ICD-10-CM | POA: Diagnosis not present

## 2021-12-26 DIAGNOSIS — Z95820 Peripheral vascular angioplasty status with implants and grafts: Secondary | ICD-10-CM | POA: Diagnosis not present

## 2021-12-26 DIAGNOSIS — G20 Parkinson's disease: Secondary | ICD-10-CM | POA: Diagnosis not present

## 2021-12-26 DIAGNOSIS — E43 Unspecified severe protein-calorie malnutrition: Secondary | ICD-10-CM | POA: Diagnosis not present

## 2021-12-26 DIAGNOSIS — L89152 Pressure ulcer of sacral region, stage 2: Secondary | ICD-10-CM | POA: Diagnosis not present

## 2021-12-26 DIAGNOSIS — I2510 Atherosclerotic heart disease of native coronary artery without angina pectoris: Secondary | ICD-10-CM | POA: Diagnosis not present

## 2021-12-26 DIAGNOSIS — Z7401 Bed confinement status: Secondary | ICD-10-CM | POA: Diagnosis not present

## 2021-12-26 DIAGNOSIS — I700 Atherosclerosis of aorta: Secondary | ICD-10-CM | POA: Diagnosis not present

## 2021-12-26 DIAGNOSIS — Z466 Encounter for fitting and adjustment of urinary device: Secondary | ICD-10-CM | POA: Diagnosis not present

## 2021-12-26 DIAGNOSIS — Z9842 Cataract extraction status, left eye: Secondary | ICD-10-CM | POA: Diagnosis not present

## 2021-12-26 DIAGNOSIS — R627 Adult failure to thrive: Secondary | ICD-10-CM | POA: Diagnosis not present

## 2021-12-26 DIAGNOSIS — M47816 Spondylosis without myelopathy or radiculopathy, lumbar region: Secondary | ICD-10-CM | POA: Diagnosis not present

## 2021-12-26 DIAGNOSIS — Z87440 Personal history of urinary (tract) infections: Secondary | ICD-10-CM | POA: Diagnosis not present

## 2021-12-26 DIAGNOSIS — I10 Essential (primary) hypertension: Secondary | ICD-10-CM | POA: Diagnosis not present

## 2021-12-26 DIAGNOSIS — E876 Hypokalemia: Secondary | ICD-10-CM | POA: Diagnosis not present

## 2021-12-26 DIAGNOSIS — Z96641 Presence of right artificial hip joint: Secondary | ICD-10-CM | POA: Diagnosis not present

## 2021-12-26 DIAGNOSIS — E785 Hyperlipidemia, unspecified: Secondary | ICD-10-CM | POA: Diagnosis not present

## 2021-12-26 DIAGNOSIS — K5900 Constipation, unspecified: Secondary | ICD-10-CM | POA: Diagnosis not present

## 2021-12-26 DIAGNOSIS — D509 Iron deficiency anemia, unspecified: Secondary | ICD-10-CM | POA: Diagnosis not present

## 2021-12-26 DIAGNOSIS — R1312 Dysphagia, oropharyngeal phase: Secondary | ICD-10-CM | POA: Diagnosis not present

## 2021-12-26 DIAGNOSIS — E1151 Type 2 diabetes mellitus with diabetic peripheral angiopathy without gangrene: Secondary | ICD-10-CM | POA: Diagnosis not present

## 2021-12-26 DIAGNOSIS — I70201 Unspecified atherosclerosis of native arteries of extremities, right leg: Secondary | ICD-10-CM | POA: Diagnosis not present

## 2021-12-26 DIAGNOSIS — R339 Retention of urine, unspecified: Secondary | ICD-10-CM | POA: Diagnosis not present

## 2021-12-26 DIAGNOSIS — E039 Hypothyroidism, unspecified: Secondary | ICD-10-CM | POA: Diagnosis not present

## 2021-12-28 DIAGNOSIS — I2510 Atherosclerotic heart disease of native coronary artery without angina pectoris: Secondary | ICD-10-CM | POA: Diagnosis not present

## 2021-12-28 DIAGNOSIS — I10 Essential (primary) hypertension: Secondary | ICD-10-CM | POA: Diagnosis not present

## 2021-12-28 DIAGNOSIS — E039 Hypothyroidism, unspecified: Secondary | ICD-10-CM | POA: Diagnosis not present

## 2021-12-28 DIAGNOSIS — R1312 Dysphagia, oropharyngeal phase: Secondary | ICD-10-CM | POA: Diagnosis not present

## 2021-12-28 DIAGNOSIS — Z9842 Cataract extraction status, left eye: Secondary | ICD-10-CM | POA: Diagnosis not present

## 2021-12-28 DIAGNOSIS — K5900 Constipation, unspecified: Secondary | ICD-10-CM | POA: Diagnosis not present

## 2021-12-28 DIAGNOSIS — E876 Hypokalemia: Secondary | ICD-10-CM | POA: Diagnosis not present

## 2021-12-28 DIAGNOSIS — Z7401 Bed confinement status: Secondary | ICD-10-CM | POA: Diagnosis not present

## 2021-12-28 DIAGNOSIS — G20 Parkinson's disease: Secondary | ICD-10-CM | POA: Diagnosis not present

## 2021-12-28 DIAGNOSIS — Z96641 Presence of right artificial hip joint: Secondary | ICD-10-CM | POA: Diagnosis not present

## 2021-12-28 DIAGNOSIS — R339 Retention of urine, unspecified: Secondary | ICD-10-CM | POA: Diagnosis not present

## 2021-12-28 DIAGNOSIS — Z87440 Personal history of urinary (tract) infections: Secondary | ICD-10-CM | POA: Diagnosis not present

## 2021-12-28 DIAGNOSIS — I700 Atherosclerosis of aorta: Secondary | ICD-10-CM | POA: Diagnosis not present

## 2021-12-28 DIAGNOSIS — Z95820 Peripheral vascular angioplasty status with implants and grafts: Secondary | ICD-10-CM | POA: Diagnosis not present

## 2021-12-28 DIAGNOSIS — I70201 Unspecified atherosclerosis of native arteries of extremities, right leg: Secondary | ICD-10-CM | POA: Diagnosis not present

## 2021-12-28 DIAGNOSIS — R627 Adult failure to thrive: Secondary | ICD-10-CM | POA: Diagnosis not present

## 2021-12-28 DIAGNOSIS — M47816 Spondylosis without myelopathy or radiculopathy, lumbar region: Secondary | ICD-10-CM | POA: Diagnosis not present

## 2021-12-28 DIAGNOSIS — L89152 Pressure ulcer of sacral region, stage 2: Secondary | ICD-10-CM | POA: Diagnosis not present

## 2021-12-28 DIAGNOSIS — Z7982 Long term (current) use of aspirin: Secondary | ICD-10-CM | POA: Diagnosis not present

## 2021-12-28 DIAGNOSIS — Z466 Encounter for fitting and adjustment of urinary device: Secondary | ICD-10-CM | POA: Diagnosis not present

## 2021-12-28 DIAGNOSIS — E785 Hyperlipidemia, unspecified: Secondary | ICD-10-CM | POA: Diagnosis not present

## 2021-12-28 DIAGNOSIS — E1151 Type 2 diabetes mellitus with diabetic peripheral angiopathy without gangrene: Secondary | ICD-10-CM | POA: Diagnosis not present

## 2021-12-28 DIAGNOSIS — D509 Iron deficiency anemia, unspecified: Secondary | ICD-10-CM | POA: Diagnosis not present

## 2021-12-28 DIAGNOSIS — E43 Unspecified severe protein-calorie malnutrition: Secondary | ICD-10-CM | POA: Diagnosis not present

## 2021-12-28 NOTE — Progress Notes (Signed)
Carelink Summary Report / Loop Recorder 

## 2021-12-31 DIAGNOSIS — G20A1 Parkinson's disease without dyskinesia, without mention of fluctuations: Secondary | ICD-10-CM | POA: Diagnosis not present

## 2021-12-31 DIAGNOSIS — Z87440 Personal history of urinary (tract) infections: Secondary | ICD-10-CM | POA: Diagnosis not present

## 2021-12-31 DIAGNOSIS — I700 Atherosclerosis of aorta: Secondary | ICD-10-CM | POA: Diagnosis not present

## 2021-12-31 DIAGNOSIS — Z9842 Cataract extraction status, left eye: Secondary | ICD-10-CM | POA: Diagnosis not present

## 2021-12-31 DIAGNOSIS — I70201 Unspecified atherosclerosis of native arteries of extremities, right leg: Secondary | ICD-10-CM | POA: Diagnosis not present

## 2021-12-31 DIAGNOSIS — Z7401 Bed confinement status: Secondary | ICD-10-CM | POA: Diagnosis not present

## 2021-12-31 DIAGNOSIS — L89152 Pressure ulcer of sacral region, stage 2: Secondary | ICD-10-CM | POA: Diagnosis not present

## 2021-12-31 DIAGNOSIS — E1151 Type 2 diabetes mellitus with diabetic peripheral angiopathy without gangrene: Secondary | ICD-10-CM | POA: Diagnosis not present

## 2021-12-31 DIAGNOSIS — D509 Iron deficiency anemia, unspecified: Secondary | ICD-10-CM | POA: Diagnosis not present

## 2021-12-31 DIAGNOSIS — E039 Hypothyroidism, unspecified: Secondary | ICD-10-CM | POA: Diagnosis not present

## 2021-12-31 DIAGNOSIS — Z466 Encounter for fitting and adjustment of urinary device: Secondary | ICD-10-CM | POA: Diagnosis not present

## 2021-12-31 DIAGNOSIS — I10 Essential (primary) hypertension: Secondary | ICD-10-CM | POA: Diagnosis not present

## 2021-12-31 DIAGNOSIS — M47816 Spondylosis without myelopathy or radiculopathy, lumbar region: Secondary | ICD-10-CM | POA: Diagnosis not present

## 2021-12-31 DIAGNOSIS — Z7982 Long term (current) use of aspirin: Secondary | ICD-10-CM | POA: Diagnosis not present

## 2021-12-31 DIAGNOSIS — K5900 Constipation, unspecified: Secondary | ICD-10-CM | POA: Diagnosis not present

## 2021-12-31 DIAGNOSIS — Z95820 Peripheral vascular angioplasty status with implants and grafts: Secondary | ICD-10-CM | POA: Diagnosis not present

## 2021-12-31 DIAGNOSIS — R1312 Dysphagia, oropharyngeal phase: Secondary | ICD-10-CM | POA: Diagnosis not present

## 2021-12-31 DIAGNOSIS — R627 Adult failure to thrive: Secondary | ICD-10-CM | POA: Diagnosis not present

## 2021-12-31 DIAGNOSIS — E43 Unspecified severe protein-calorie malnutrition: Secondary | ICD-10-CM | POA: Diagnosis not present

## 2021-12-31 DIAGNOSIS — E785 Hyperlipidemia, unspecified: Secondary | ICD-10-CM | POA: Diagnosis not present

## 2021-12-31 DIAGNOSIS — E876 Hypokalemia: Secondary | ICD-10-CM | POA: Diagnosis not present

## 2021-12-31 DIAGNOSIS — Z96641 Presence of right artificial hip joint: Secondary | ICD-10-CM | POA: Diagnosis not present

## 2021-12-31 DIAGNOSIS — R339 Retention of urine, unspecified: Secondary | ICD-10-CM | POA: Diagnosis not present

## 2021-12-31 DIAGNOSIS — I2510 Atherosclerotic heart disease of native coronary artery without angina pectoris: Secondary | ICD-10-CM | POA: Diagnosis not present

## 2022-01-01 DIAGNOSIS — R627 Adult failure to thrive: Secondary | ICD-10-CM | POA: Diagnosis not present

## 2022-01-01 DIAGNOSIS — Z9842 Cataract extraction status, left eye: Secondary | ICD-10-CM | POA: Diagnosis not present

## 2022-01-01 DIAGNOSIS — R1312 Dysphagia, oropharyngeal phase: Secondary | ICD-10-CM | POA: Diagnosis not present

## 2022-01-01 DIAGNOSIS — I700 Atherosclerosis of aorta: Secondary | ICD-10-CM | POA: Diagnosis not present

## 2022-01-01 DIAGNOSIS — Z95820 Peripheral vascular angioplasty status with implants and grafts: Secondary | ICD-10-CM | POA: Diagnosis not present

## 2022-01-01 DIAGNOSIS — G20A1 Parkinson's disease without dyskinesia, without mention of fluctuations: Secondary | ICD-10-CM | POA: Diagnosis not present

## 2022-01-01 DIAGNOSIS — D509 Iron deficiency anemia, unspecified: Secondary | ICD-10-CM | POA: Diagnosis not present

## 2022-01-01 DIAGNOSIS — Z7401 Bed confinement status: Secondary | ICD-10-CM | POA: Diagnosis not present

## 2022-01-01 DIAGNOSIS — I10 Essential (primary) hypertension: Secondary | ICD-10-CM | POA: Diagnosis not present

## 2022-01-01 DIAGNOSIS — E43 Unspecified severe protein-calorie malnutrition: Secondary | ICD-10-CM | POA: Diagnosis not present

## 2022-01-01 DIAGNOSIS — K5900 Constipation, unspecified: Secondary | ICD-10-CM | POA: Diagnosis not present

## 2022-01-01 DIAGNOSIS — I2510 Atherosclerotic heart disease of native coronary artery without angina pectoris: Secondary | ICD-10-CM | POA: Diagnosis not present

## 2022-01-01 DIAGNOSIS — E876 Hypokalemia: Secondary | ICD-10-CM | POA: Diagnosis not present

## 2022-01-01 DIAGNOSIS — R339 Retention of urine, unspecified: Secondary | ICD-10-CM | POA: Diagnosis not present

## 2022-01-01 DIAGNOSIS — E785 Hyperlipidemia, unspecified: Secondary | ICD-10-CM | POA: Diagnosis not present

## 2022-01-01 DIAGNOSIS — Z87440 Personal history of urinary (tract) infections: Secondary | ICD-10-CM | POA: Diagnosis not present

## 2022-01-01 DIAGNOSIS — L89152 Pressure ulcer of sacral region, stage 2: Secondary | ICD-10-CM | POA: Diagnosis not present

## 2022-01-01 DIAGNOSIS — E1151 Type 2 diabetes mellitus with diabetic peripheral angiopathy without gangrene: Secondary | ICD-10-CM | POA: Diagnosis not present

## 2022-01-01 DIAGNOSIS — M47816 Spondylosis without myelopathy or radiculopathy, lumbar region: Secondary | ICD-10-CM | POA: Diagnosis not present

## 2022-01-01 DIAGNOSIS — I70201 Unspecified atherosclerosis of native arteries of extremities, right leg: Secondary | ICD-10-CM | POA: Diagnosis not present

## 2022-01-01 DIAGNOSIS — Z7982 Long term (current) use of aspirin: Secondary | ICD-10-CM | POA: Diagnosis not present

## 2022-01-01 DIAGNOSIS — E039 Hypothyroidism, unspecified: Secondary | ICD-10-CM | POA: Diagnosis not present

## 2022-01-01 DIAGNOSIS — Z96641 Presence of right artificial hip joint: Secondary | ICD-10-CM | POA: Diagnosis not present

## 2022-01-01 DIAGNOSIS — Z466 Encounter for fitting and adjustment of urinary device: Secondary | ICD-10-CM | POA: Diagnosis not present

## 2022-01-03 DIAGNOSIS — R1312 Dysphagia, oropharyngeal phase: Secondary | ICD-10-CM | POA: Diagnosis not present

## 2022-01-03 DIAGNOSIS — Z96641 Presence of right artificial hip joint: Secondary | ICD-10-CM | POA: Diagnosis not present

## 2022-01-03 DIAGNOSIS — R339 Retention of urine, unspecified: Secondary | ICD-10-CM | POA: Diagnosis not present

## 2022-01-03 DIAGNOSIS — R627 Adult failure to thrive: Secondary | ICD-10-CM | POA: Diagnosis not present

## 2022-01-03 DIAGNOSIS — E039 Hypothyroidism, unspecified: Secondary | ICD-10-CM | POA: Diagnosis not present

## 2022-01-03 DIAGNOSIS — I10 Essential (primary) hypertension: Secondary | ICD-10-CM | POA: Diagnosis not present

## 2022-01-03 DIAGNOSIS — K5900 Constipation, unspecified: Secondary | ICD-10-CM | POA: Diagnosis not present

## 2022-01-03 DIAGNOSIS — I70201 Unspecified atherosclerosis of native arteries of extremities, right leg: Secondary | ICD-10-CM | POA: Diagnosis not present

## 2022-01-03 DIAGNOSIS — L89152 Pressure ulcer of sacral region, stage 2: Secondary | ICD-10-CM | POA: Diagnosis not present

## 2022-01-03 DIAGNOSIS — E876 Hypokalemia: Secondary | ICD-10-CM | POA: Diagnosis not present

## 2022-01-03 DIAGNOSIS — E785 Hyperlipidemia, unspecified: Secondary | ICD-10-CM | POA: Diagnosis not present

## 2022-01-03 DIAGNOSIS — Z95820 Peripheral vascular angioplasty status with implants and grafts: Secondary | ICD-10-CM | POA: Diagnosis not present

## 2022-01-03 DIAGNOSIS — E43 Unspecified severe protein-calorie malnutrition: Secondary | ICD-10-CM | POA: Diagnosis not present

## 2022-01-03 DIAGNOSIS — Z9842 Cataract extraction status, left eye: Secondary | ICD-10-CM | POA: Diagnosis not present

## 2022-01-03 DIAGNOSIS — G20A1 Parkinson's disease without dyskinesia, without mention of fluctuations: Secondary | ICD-10-CM | POA: Diagnosis not present

## 2022-01-03 DIAGNOSIS — E1151 Type 2 diabetes mellitus with diabetic peripheral angiopathy without gangrene: Secondary | ICD-10-CM | POA: Diagnosis not present

## 2022-01-03 DIAGNOSIS — Z87440 Personal history of urinary (tract) infections: Secondary | ICD-10-CM | POA: Diagnosis not present

## 2022-01-03 DIAGNOSIS — I2510 Atherosclerotic heart disease of native coronary artery without angina pectoris: Secondary | ICD-10-CM | POA: Diagnosis not present

## 2022-01-03 DIAGNOSIS — Z7401 Bed confinement status: Secondary | ICD-10-CM | POA: Diagnosis not present

## 2022-01-03 DIAGNOSIS — Z466 Encounter for fitting and adjustment of urinary device: Secondary | ICD-10-CM | POA: Diagnosis not present

## 2022-01-03 DIAGNOSIS — Z7982 Long term (current) use of aspirin: Secondary | ICD-10-CM | POA: Diagnosis not present

## 2022-01-03 DIAGNOSIS — D509 Iron deficiency anemia, unspecified: Secondary | ICD-10-CM | POA: Diagnosis not present

## 2022-01-03 DIAGNOSIS — M47816 Spondylosis without myelopathy or radiculopathy, lumbar region: Secondary | ICD-10-CM | POA: Diagnosis not present

## 2022-01-03 DIAGNOSIS — I700 Atherosclerosis of aorta: Secondary | ICD-10-CM | POA: Diagnosis not present

## 2022-01-04 DIAGNOSIS — R1312 Dysphagia, oropharyngeal phase: Secondary | ICD-10-CM | POA: Diagnosis not present

## 2022-01-04 DIAGNOSIS — Z87440 Personal history of urinary (tract) infections: Secondary | ICD-10-CM | POA: Diagnosis not present

## 2022-01-04 DIAGNOSIS — E039 Hypothyroidism, unspecified: Secondary | ICD-10-CM | POA: Diagnosis not present

## 2022-01-04 DIAGNOSIS — I10 Essential (primary) hypertension: Secondary | ICD-10-CM | POA: Diagnosis not present

## 2022-01-04 DIAGNOSIS — K5900 Constipation, unspecified: Secondary | ICD-10-CM | POA: Diagnosis not present

## 2022-01-04 DIAGNOSIS — E43 Unspecified severe protein-calorie malnutrition: Secondary | ICD-10-CM | POA: Diagnosis not present

## 2022-01-04 DIAGNOSIS — I70201 Unspecified atherosclerosis of native arteries of extremities, right leg: Secondary | ICD-10-CM | POA: Diagnosis not present

## 2022-01-04 DIAGNOSIS — L89152 Pressure ulcer of sacral region, stage 2: Secondary | ICD-10-CM | POA: Diagnosis not present

## 2022-01-04 DIAGNOSIS — D509 Iron deficiency anemia, unspecified: Secondary | ICD-10-CM | POA: Diagnosis not present

## 2022-01-04 DIAGNOSIS — Z9842 Cataract extraction status, left eye: Secondary | ICD-10-CM | POA: Diagnosis not present

## 2022-01-04 DIAGNOSIS — E785 Hyperlipidemia, unspecified: Secondary | ICD-10-CM | POA: Diagnosis not present

## 2022-01-04 DIAGNOSIS — R627 Adult failure to thrive: Secondary | ICD-10-CM | POA: Diagnosis not present

## 2022-01-04 DIAGNOSIS — Z466 Encounter for fitting and adjustment of urinary device: Secondary | ICD-10-CM | POA: Diagnosis not present

## 2022-01-04 DIAGNOSIS — G20A1 Parkinson's disease without dyskinesia, without mention of fluctuations: Secondary | ICD-10-CM | POA: Diagnosis not present

## 2022-01-04 DIAGNOSIS — Z7401 Bed confinement status: Secondary | ICD-10-CM | POA: Diagnosis not present

## 2022-01-04 DIAGNOSIS — I700 Atherosclerosis of aorta: Secondary | ICD-10-CM | POA: Diagnosis not present

## 2022-01-04 DIAGNOSIS — G20C Parkinsonism, unspecified: Secondary | ICD-10-CM | POA: Diagnosis not present

## 2022-01-04 DIAGNOSIS — Z95820 Peripheral vascular angioplasty status with implants and grafts: Secondary | ICD-10-CM | POA: Diagnosis not present

## 2022-01-04 DIAGNOSIS — M47816 Spondylosis without myelopathy or radiculopathy, lumbar region: Secondary | ICD-10-CM | POA: Diagnosis not present

## 2022-01-04 DIAGNOSIS — R339 Retention of urine, unspecified: Secondary | ICD-10-CM | POA: Diagnosis not present

## 2022-01-04 DIAGNOSIS — Z7982 Long term (current) use of aspirin: Secondary | ICD-10-CM | POA: Diagnosis not present

## 2022-01-04 DIAGNOSIS — E1151 Type 2 diabetes mellitus with diabetic peripheral angiopathy without gangrene: Secondary | ICD-10-CM | POA: Diagnosis not present

## 2022-01-04 DIAGNOSIS — I2510 Atherosclerotic heart disease of native coronary artery without angina pectoris: Secondary | ICD-10-CM | POA: Diagnosis not present

## 2022-01-04 DIAGNOSIS — E876 Hypokalemia: Secondary | ICD-10-CM | POA: Diagnosis not present

## 2022-01-04 DIAGNOSIS — Z96641 Presence of right artificial hip joint: Secondary | ICD-10-CM | POA: Diagnosis not present

## 2022-01-05 DIAGNOSIS — L89154 Pressure ulcer of sacral region, stage 4: Secondary | ICD-10-CM | POA: Diagnosis not present

## 2022-01-05 DIAGNOSIS — Z9181 History of falling: Secondary | ICD-10-CM | POA: Diagnosis not present

## 2022-01-07 DIAGNOSIS — I2510 Atherosclerotic heart disease of native coronary artery without angina pectoris: Secondary | ICD-10-CM | POA: Diagnosis not present

## 2022-01-07 DIAGNOSIS — Z95820 Peripheral vascular angioplasty status with implants and grafts: Secondary | ICD-10-CM | POA: Diagnosis not present

## 2022-01-07 DIAGNOSIS — Z7401 Bed confinement status: Secondary | ICD-10-CM | POA: Diagnosis not present

## 2022-01-07 DIAGNOSIS — E1151 Type 2 diabetes mellitus with diabetic peripheral angiopathy without gangrene: Secondary | ICD-10-CM | POA: Diagnosis not present

## 2022-01-07 DIAGNOSIS — I70201 Unspecified atherosclerosis of native arteries of extremities, right leg: Secondary | ICD-10-CM | POA: Diagnosis not present

## 2022-01-07 DIAGNOSIS — K5900 Constipation, unspecified: Secondary | ICD-10-CM | POA: Diagnosis not present

## 2022-01-07 DIAGNOSIS — R1312 Dysphagia, oropharyngeal phase: Secondary | ICD-10-CM | POA: Diagnosis not present

## 2022-01-07 DIAGNOSIS — G20A1 Parkinson's disease without dyskinesia, without mention of fluctuations: Secondary | ICD-10-CM | POA: Diagnosis not present

## 2022-01-07 DIAGNOSIS — Z7982 Long term (current) use of aspirin: Secondary | ICD-10-CM | POA: Diagnosis not present

## 2022-01-07 DIAGNOSIS — D509 Iron deficiency anemia, unspecified: Secondary | ICD-10-CM | POA: Diagnosis not present

## 2022-01-07 DIAGNOSIS — Z96641 Presence of right artificial hip joint: Secondary | ICD-10-CM | POA: Diagnosis not present

## 2022-01-07 DIAGNOSIS — Z9842 Cataract extraction status, left eye: Secondary | ICD-10-CM | POA: Diagnosis not present

## 2022-01-07 DIAGNOSIS — L89152 Pressure ulcer of sacral region, stage 2: Secondary | ICD-10-CM | POA: Diagnosis not present

## 2022-01-07 DIAGNOSIS — E876 Hypokalemia: Secondary | ICD-10-CM | POA: Diagnosis not present

## 2022-01-07 DIAGNOSIS — E039 Hypothyroidism, unspecified: Secondary | ICD-10-CM | POA: Diagnosis not present

## 2022-01-07 DIAGNOSIS — Z87440 Personal history of urinary (tract) infections: Secondary | ICD-10-CM | POA: Diagnosis not present

## 2022-01-07 DIAGNOSIS — E43 Unspecified severe protein-calorie malnutrition: Secondary | ICD-10-CM | POA: Diagnosis not present

## 2022-01-07 DIAGNOSIS — M47816 Spondylosis without myelopathy or radiculopathy, lumbar region: Secondary | ICD-10-CM | POA: Diagnosis not present

## 2022-01-07 DIAGNOSIS — R627 Adult failure to thrive: Secondary | ICD-10-CM | POA: Diagnosis not present

## 2022-01-07 DIAGNOSIS — Z466 Encounter for fitting and adjustment of urinary device: Secondary | ICD-10-CM | POA: Diagnosis not present

## 2022-01-07 DIAGNOSIS — E785 Hyperlipidemia, unspecified: Secondary | ICD-10-CM | POA: Diagnosis not present

## 2022-01-07 DIAGNOSIS — I10 Essential (primary) hypertension: Secondary | ICD-10-CM | POA: Diagnosis not present

## 2022-01-07 DIAGNOSIS — I700 Atherosclerosis of aorta: Secondary | ICD-10-CM | POA: Diagnosis not present

## 2022-01-07 DIAGNOSIS — R339 Retention of urine, unspecified: Secondary | ICD-10-CM | POA: Diagnosis not present

## 2022-01-09 DIAGNOSIS — R627 Adult failure to thrive: Secondary | ICD-10-CM | POA: Diagnosis not present

## 2022-01-09 DIAGNOSIS — I70201 Unspecified atherosclerosis of native arteries of extremities, right leg: Secondary | ICD-10-CM | POA: Diagnosis not present

## 2022-01-09 DIAGNOSIS — M47816 Spondylosis without myelopathy or radiculopathy, lumbar region: Secondary | ICD-10-CM | POA: Diagnosis not present

## 2022-01-09 DIAGNOSIS — Z96641 Presence of right artificial hip joint: Secondary | ICD-10-CM | POA: Diagnosis not present

## 2022-01-09 DIAGNOSIS — Z7401 Bed confinement status: Secondary | ICD-10-CM | POA: Diagnosis not present

## 2022-01-09 DIAGNOSIS — Z9842 Cataract extraction status, left eye: Secondary | ICD-10-CM | POA: Diagnosis not present

## 2022-01-09 DIAGNOSIS — Z466 Encounter for fitting and adjustment of urinary device: Secondary | ICD-10-CM | POA: Diagnosis not present

## 2022-01-09 DIAGNOSIS — D509 Iron deficiency anemia, unspecified: Secondary | ICD-10-CM | POA: Diagnosis not present

## 2022-01-09 DIAGNOSIS — N390 Urinary tract infection, site not specified: Secondary | ICD-10-CM | POA: Diagnosis not present

## 2022-01-09 DIAGNOSIS — Z7982 Long term (current) use of aspirin: Secondary | ICD-10-CM | POA: Diagnosis not present

## 2022-01-09 DIAGNOSIS — G20A1 Parkinson's disease without dyskinesia, without mention of fluctuations: Secondary | ICD-10-CM | POA: Diagnosis not present

## 2022-01-09 DIAGNOSIS — L89152 Pressure ulcer of sacral region, stage 2: Secondary | ICD-10-CM | POA: Diagnosis not present

## 2022-01-09 DIAGNOSIS — Z95820 Peripheral vascular angioplasty status with implants and grafts: Secondary | ICD-10-CM | POA: Diagnosis not present

## 2022-01-09 DIAGNOSIS — I700 Atherosclerosis of aorta: Secondary | ICD-10-CM | POA: Diagnosis not present

## 2022-01-09 DIAGNOSIS — E785 Hyperlipidemia, unspecified: Secondary | ICD-10-CM | POA: Diagnosis not present

## 2022-01-09 DIAGNOSIS — I10 Essential (primary) hypertension: Secondary | ICD-10-CM | POA: Diagnosis not present

## 2022-01-09 DIAGNOSIS — K5900 Constipation, unspecified: Secondary | ICD-10-CM | POA: Diagnosis not present

## 2022-01-09 DIAGNOSIS — E43 Unspecified severe protein-calorie malnutrition: Secondary | ICD-10-CM | POA: Diagnosis not present

## 2022-01-09 DIAGNOSIS — E876 Hypokalemia: Secondary | ICD-10-CM | POA: Diagnosis not present

## 2022-01-09 DIAGNOSIS — R339 Retention of urine, unspecified: Secondary | ICD-10-CM | POA: Diagnosis not present

## 2022-01-09 DIAGNOSIS — E039 Hypothyroidism, unspecified: Secondary | ICD-10-CM | POA: Diagnosis not present

## 2022-01-09 DIAGNOSIS — Z87440 Personal history of urinary (tract) infections: Secondary | ICD-10-CM | POA: Diagnosis not present

## 2022-01-09 DIAGNOSIS — I2510 Atherosclerotic heart disease of native coronary artery without angina pectoris: Secondary | ICD-10-CM | POA: Diagnosis not present

## 2022-01-09 DIAGNOSIS — R1312 Dysphagia, oropharyngeal phase: Secondary | ICD-10-CM | POA: Diagnosis not present

## 2022-01-09 DIAGNOSIS — E1151 Type 2 diabetes mellitus with diabetic peripheral angiopathy without gangrene: Secondary | ICD-10-CM | POA: Diagnosis not present

## 2022-01-11 DIAGNOSIS — Z96641 Presence of right artificial hip joint: Secondary | ICD-10-CM | POA: Diagnosis not present

## 2022-01-11 DIAGNOSIS — Z95820 Peripheral vascular angioplasty status with implants and grafts: Secondary | ICD-10-CM | POA: Diagnosis not present

## 2022-01-11 DIAGNOSIS — Z87440 Personal history of urinary (tract) infections: Secondary | ICD-10-CM | POA: Diagnosis not present

## 2022-01-11 DIAGNOSIS — R339 Retention of urine, unspecified: Secondary | ICD-10-CM | POA: Diagnosis not present

## 2022-01-11 DIAGNOSIS — I10 Essential (primary) hypertension: Secondary | ICD-10-CM | POA: Diagnosis not present

## 2022-01-11 DIAGNOSIS — Z7982 Long term (current) use of aspirin: Secondary | ICD-10-CM | POA: Diagnosis not present

## 2022-01-11 DIAGNOSIS — L89152 Pressure ulcer of sacral region, stage 2: Secondary | ICD-10-CM | POA: Diagnosis not present

## 2022-01-11 DIAGNOSIS — K5900 Constipation, unspecified: Secondary | ICD-10-CM | POA: Diagnosis not present

## 2022-01-11 DIAGNOSIS — E039 Hypothyroidism, unspecified: Secondary | ICD-10-CM | POA: Diagnosis not present

## 2022-01-11 DIAGNOSIS — E876 Hypokalemia: Secondary | ICD-10-CM | POA: Diagnosis not present

## 2022-01-11 DIAGNOSIS — I700 Atherosclerosis of aorta: Secondary | ICD-10-CM | POA: Diagnosis not present

## 2022-01-11 DIAGNOSIS — E785 Hyperlipidemia, unspecified: Secondary | ICD-10-CM | POA: Diagnosis not present

## 2022-01-11 DIAGNOSIS — M47816 Spondylosis without myelopathy or radiculopathy, lumbar region: Secondary | ICD-10-CM | POA: Diagnosis not present

## 2022-01-11 DIAGNOSIS — R1312 Dysphagia, oropharyngeal phase: Secondary | ICD-10-CM | POA: Diagnosis not present

## 2022-01-11 DIAGNOSIS — Z9842 Cataract extraction status, left eye: Secondary | ICD-10-CM | POA: Diagnosis not present

## 2022-01-11 DIAGNOSIS — E1151 Type 2 diabetes mellitus with diabetic peripheral angiopathy without gangrene: Secondary | ICD-10-CM | POA: Diagnosis not present

## 2022-01-11 DIAGNOSIS — E43 Unspecified severe protein-calorie malnutrition: Secondary | ICD-10-CM | POA: Diagnosis not present

## 2022-01-11 DIAGNOSIS — Z466 Encounter for fitting and adjustment of urinary device: Secondary | ICD-10-CM | POA: Diagnosis not present

## 2022-01-11 DIAGNOSIS — Z7401 Bed confinement status: Secondary | ICD-10-CM | POA: Diagnosis not present

## 2022-01-11 DIAGNOSIS — I70201 Unspecified atherosclerosis of native arteries of extremities, right leg: Secondary | ICD-10-CM | POA: Diagnosis not present

## 2022-01-11 DIAGNOSIS — R627 Adult failure to thrive: Secondary | ICD-10-CM | POA: Diagnosis not present

## 2022-01-11 DIAGNOSIS — D509 Iron deficiency anemia, unspecified: Secondary | ICD-10-CM | POA: Diagnosis not present

## 2022-01-11 DIAGNOSIS — G20A1 Parkinson's disease without dyskinesia, without mention of fluctuations: Secondary | ICD-10-CM | POA: Diagnosis not present

## 2022-01-11 DIAGNOSIS — I2510 Atherosclerotic heart disease of native coronary artery without angina pectoris: Secondary | ICD-10-CM | POA: Diagnosis not present

## 2022-01-14 DIAGNOSIS — Z87440 Personal history of urinary (tract) infections: Secondary | ICD-10-CM | POA: Diagnosis not present

## 2022-01-14 DIAGNOSIS — K5900 Constipation, unspecified: Secondary | ICD-10-CM | POA: Diagnosis not present

## 2022-01-14 DIAGNOSIS — E876 Hypokalemia: Secondary | ICD-10-CM | POA: Diagnosis not present

## 2022-01-14 DIAGNOSIS — Z466 Encounter for fitting and adjustment of urinary device: Secondary | ICD-10-CM | POA: Diagnosis not present

## 2022-01-14 DIAGNOSIS — Z9842 Cataract extraction status, left eye: Secondary | ICD-10-CM | POA: Diagnosis not present

## 2022-01-14 DIAGNOSIS — M47816 Spondylosis without myelopathy or radiculopathy, lumbar region: Secondary | ICD-10-CM | POA: Diagnosis not present

## 2022-01-14 DIAGNOSIS — E43 Unspecified severe protein-calorie malnutrition: Secondary | ICD-10-CM | POA: Diagnosis not present

## 2022-01-14 DIAGNOSIS — R627 Adult failure to thrive: Secondary | ICD-10-CM | POA: Diagnosis not present

## 2022-01-14 DIAGNOSIS — G20A1 Parkinson's disease without dyskinesia, without mention of fluctuations: Secondary | ICD-10-CM | POA: Diagnosis not present

## 2022-01-14 DIAGNOSIS — Z95820 Peripheral vascular angioplasty status with implants and grafts: Secondary | ICD-10-CM | POA: Diagnosis not present

## 2022-01-14 DIAGNOSIS — R1312 Dysphagia, oropharyngeal phase: Secondary | ICD-10-CM | POA: Diagnosis not present

## 2022-01-14 DIAGNOSIS — Z96641 Presence of right artificial hip joint: Secondary | ICD-10-CM | POA: Diagnosis not present

## 2022-01-14 DIAGNOSIS — D509 Iron deficiency anemia, unspecified: Secondary | ICD-10-CM | POA: Diagnosis not present

## 2022-01-14 DIAGNOSIS — I2510 Atherosclerotic heart disease of native coronary artery without angina pectoris: Secondary | ICD-10-CM | POA: Diagnosis not present

## 2022-01-14 DIAGNOSIS — Z7982 Long term (current) use of aspirin: Secondary | ICD-10-CM | POA: Diagnosis not present

## 2022-01-14 DIAGNOSIS — I700 Atherosclerosis of aorta: Secondary | ICD-10-CM | POA: Diagnosis not present

## 2022-01-14 DIAGNOSIS — I10 Essential (primary) hypertension: Secondary | ICD-10-CM | POA: Diagnosis not present

## 2022-01-14 DIAGNOSIS — E1151 Type 2 diabetes mellitus with diabetic peripheral angiopathy without gangrene: Secondary | ICD-10-CM | POA: Diagnosis not present

## 2022-01-14 DIAGNOSIS — E039 Hypothyroidism, unspecified: Secondary | ICD-10-CM | POA: Diagnosis not present

## 2022-01-14 DIAGNOSIS — L89152 Pressure ulcer of sacral region, stage 2: Secondary | ICD-10-CM | POA: Diagnosis not present

## 2022-01-14 DIAGNOSIS — E785 Hyperlipidemia, unspecified: Secondary | ICD-10-CM | POA: Diagnosis not present

## 2022-01-14 DIAGNOSIS — Z7401 Bed confinement status: Secondary | ICD-10-CM | POA: Diagnosis not present

## 2022-01-14 DIAGNOSIS — R339 Retention of urine, unspecified: Secondary | ICD-10-CM | POA: Diagnosis not present

## 2022-01-14 DIAGNOSIS — I70201 Unspecified atherosclerosis of native arteries of extremities, right leg: Secondary | ICD-10-CM | POA: Diagnosis not present

## 2022-01-15 DIAGNOSIS — Z7401 Bed confinement status: Secondary | ICD-10-CM | POA: Diagnosis not present

## 2022-01-15 DIAGNOSIS — N390 Urinary tract infection, site not specified: Secondary | ICD-10-CM | POA: Diagnosis not present

## 2022-01-15 DIAGNOSIS — R338 Other retention of urine: Secondary | ICD-10-CM | POA: Diagnosis not present

## 2022-01-15 DIAGNOSIS — R410 Disorientation, unspecified: Secondary | ICD-10-CM | POA: Diagnosis not present

## 2022-01-17 ENCOUNTER — Other Ambulatory Visit: Payer: Self-pay | Admitting: Neurology

## 2022-01-17 DIAGNOSIS — I70201 Unspecified atherosclerosis of native arteries of extremities, right leg: Secondary | ICD-10-CM | POA: Diagnosis not present

## 2022-01-17 DIAGNOSIS — M47816 Spondylosis without myelopathy or radiculopathy, lumbar region: Secondary | ICD-10-CM | POA: Diagnosis not present

## 2022-01-17 DIAGNOSIS — Z7982 Long term (current) use of aspirin: Secondary | ICD-10-CM | POA: Diagnosis not present

## 2022-01-17 DIAGNOSIS — D509 Iron deficiency anemia, unspecified: Secondary | ICD-10-CM | POA: Diagnosis not present

## 2022-01-17 DIAGNOSIS — E039 Hypothyroidism, unspecified: Secondary | ICD-10-CM | POA: Diagnosis not present

## 2022-01-17 DIAGNOSIS — Z96641 Presence of right artificial hip joint: Secondary | ICD-10-CM | POA: Diagnosis not present

## 2022-01-17 DIAGNOSIS — Z87440 Personal history of urinary (tract) infections: Secondary | ICD-10-CM | POA: Diagnosis not present

## 2022-01-17 DIAGNOSIS — R339 Retention of urine, unspecified: Secondary | ICD-10-CM | POA: Diagnosis not present

## 2022-01-17 DIAGNOSIS — E43 Unspecified severe protein-calorie malnutrition: Secondary | ICD-10-CM | POA: Diagnosis not present

## 2022-01-17 DIAGNOSIS — E876 Hypokalemia: Secondary | ICD-10-CM | POA: Diagnosis not present

## 2022-01-17 DIAGNOSIS — I700 Atherosclerosis of aorta: Secondary | ICD-10-CM | POA: Diagnosis not present

## 2022-01-17 DIAGNOSIS — Z9842 Cataract extraction status, left eye: Secondary | ICD-10-CM | POA: Diagnosis not present

## 2022-01-17 DIAGNOSIS — E785 Hyperlipidemia, unspecified: Secondary | ICD-10-CM | POA: Diagnosis not present

## 2022-01-17 DIAGNOSIS — I2510 Atherosclerotic heart disease of native coronary artery without angina pectoris: Secondary | ICD-10-CM | POA: Diagnosis not present

## 2022-01-17 DIAGNOSIS — Z466 Encounter for fitting and adjustment of urinary device: Secondary | ICD-10-CM | POA: Diagnosis not present

## 2022-01-17 DIAGNOSIS — L89152 Pressure ulcer of sacral region, stage 2: Secondary | ICD-10-CM | POA: Diagnosis not present

## 2022-01-17 DIAGNOSIS — Z95820 Peripheral vascular angioplasty status with implants and grafts: Secondary | ICD-10-CM | POA: Diagnosis not present

## 2022-01-17 DIAGNOSIS — R627 Adult failure to thrive: Secondary | ICD-10-CM | POA: Diagnosis not present

## 2022-01-17 DIAGNOSIS — K5900 Constipation, unspecified: Secondary | ICD-10-CM | POA: Diagnosis not present

## 2022-01-17 DIAGNOSIS — I10 Essential (primary) hypertension: Secondary | ICD-10-CM | POA: Diagnosis not present

## 2022-01-17 DIAGNOSIS — R1312 Dysphagia, oropharyngeal phase: Secondary | ICD-10-CM | POA: Diagnosis not present

## 2022-01-17 DIAGNOSIS — E1151 Type 2 diabetes mellitus with diabetic peripheral angiopathy without gangrene: Secondary | ICD-10-CM | POA: Diagnosis not present

## 2022-01-17 DIAGNOSIS — Z7401 Bed confinement status: Secondary | ICD-10-CM | POA: Diagnosis not present

## 2022-01-17 DIAGNOSIS — G20A1 Parkinson's disease without dyskinesia, without mention of fluctuations: Secondary | ICD-10-CM | POA: Diagnosis not present

## 2022-01-18 DIAGNOSIS — R627 Adult failure to thrive: Secondary | ICD-10-CM | POA: Diagnosis not present

## 2022-01-18 DIAGNOSIS — L89152 Pressure ulcer of sacral region, stage 2: Secondary | ICD-10-CM | POA: Diagnosis not present

## 2022-01-18 DIAGNOSIS — Z87440 Personal history of urinary (tract) infections: Secondary | ICD-10-CM | POA: Diagnosis not present

## 2022-01-18 DIAGNOSIS — I2510 Atherosclerotic heart disease of native coronary artery without angina pectoris: Secondary | ICD-10-CM | POA: Diagnosis not present

## 2022-01-18 DIAGNOSIS — Z95820 Peripheral vascular angioplasty status with implants and grafts: Secondary | ICD-10-CM | POA: Diagnosis not present

## 2022-01-18 DIAGNOSIS — Z7401 Bed confinement status: Secondary | ICD-10-CM | POA: Diagnosis not present

## 2022-01-18 DIAGNOSIS — E039 Hypothyroidism, unspecified: Secondary | ICD-10-CM | POA: Diagnosis not present

## 2022-01-18 DIAGNOSIS — R1312 Dysphagia, oropharyngeal phase: Secondary | ICD-10-CM | POA: Diagnosis not present

## 2022-01-18 DIAGNOSIS — E785 Hyperlipidemia, unspecified: Secondary | ICD-10-CM | POA: Diagnosis not present

## 2022-01-18 DIAGNOSIS — E1151 Type 2 diabetes mellitus with diabetic peripheral angiopathy without gangrene: Secondary | ICD-10-CM | POA: Diagnosis not present

## 2022-01-18 DIAGNOSIS — M47816 Spondylosis without myelopathy or radiculopathy, lumbar region: Secondary | ICD-10-CM | POA: Diagnosis not present

## 2022-01-18 DIAGNOSIS — G20A1 Parkinson's disease without dyskinesia, without mention of fluctuations: Secondary | ICD-10-CM | POA: Diagnosis not present

## 2022-01-18 DIAGNOSIS — K5900 Constipation, unspecified: Secondary | ICD-10-CM | POA: Diagnosis not present

## 2022-01-18 DIAGNOSIS — D509 Iron deficiency anemia, unspecified: Secondary | ICD-10-CM | POA: Diagnosis not present

## 2022-01-18 DIAGNOSIS — Z466 Encounter for fitting and adjustment of urinary device: Secondary | ICD-10-CM | POA: Diagnosis not present

## 2022-01-18 DIAGNOSIS — Z7982 Long term (current) use of aspirin: Secondary | ICD-10-CM | POA: Diagnosis not present

## 2022-01-18 DIAGNOSIS — E876 Hypokalemia: Secondary | ICD-10-CM | POA: Diagnosis not present

## 2022-01-18 DIAGNOSIS — I10 Essential (primary) hypertension: Secondary | ICD-10-CM | POA: Diagnosis not present

## 2022-01-18 DIAGNOSIS — Z96641 Presence of right artificial hip joint: Secondary | ICD-10-CM | POA: Diagnosis not present

## 2022-01-18 DIAGNOSIS — I700 Atherosclerosis of aorta: Secondary | ICD-10-CM | POA: Diagnosis not present

## 2022-01-18 DIAGNOSIS — R339 Retention of urine, unspecified: Secondary | ICD-10-CM | POA: Diagnosis not present

## 2022-01-18 DIAGNOSIS — I70201 Unspecified atherosclerosis of native arteries of extremities, right leg: Secondary | ICD-10-CM | POA: Diagnosis not present

## 2022-01-18 DIAGNOSIS — E43 Unspecified severe protein-calorie malnutrition: Secondary | ICD-10-CM | POA: Diagnosis not present

## 2022-01-18 DIAGNOSIS — Z9842 Cataract extraction status, left eye: Secondary | ICD-10-CM | POA: Diagnosis not present

## 2022-01-21 ENCOUNTER — Ambulatory Visit (INDEPENDENT_AMBULATORY_CARE_PROVIDER_SITE_OTHER): Payer: Medicare Other

## 2022-01-21 DIAGNOSIS — R55 Syncope and collapse: Secondary | ICD-10-CM

## 2022-01-21 DIAGNOSIS — K5900 Constipation, unspecified: Secondary | ICD-10-CM | POA: Diagnosis not present

## 2022-01-21 DIAGNOSIS — L89152 Pressure ulcer of sacral region, stage 2: Secondary | ICD-10-CM | POA: Diagnosis not present

## 2022-01-21 DIAGNOSIS — Z7982 Long term (current) use of aspirin: Secondary | ICD-10-CM | POA: Diagnosis not present

## 2022-01-21 DIAGNOSIS — E785 Hyperlipidemia, unspecified: Secondary | ICD-10-CM | POA: Diagnosis not present

## 2022-01-21 DIAGNOSIS — Z95820 Peripheral vascular angioplasty status with implants and grafts: Secondary | ICD-10-CM | POA: Diagnosis not present

## 2022-01-21 DIAGNOSIS — E039 Hypothyroidism, unspecified: Secondary | ICD-10-CM | POA: Diagnosis not present

## 2022-01-21 DIAGNOSIS — I700 Atherosclerosis of aorta: Secondary | ICD-10-CM | POA: Diagnosis not present

## 2022-01-21 DIAGNOSIS — E876 Hypokalemia: Secondary | ICD-10-CM | POA: Diagnosis not present

## 2022-01-21 DIAGNOSIS — R1312 Dysphagia, oropharyngeal phase: Secondary | ICD-10-CM | POA: Diagnosis not present

## 2022-01-21 DIAGNOSIS — R627 Adult failure to thrive: Secondary | ICD-10-CM | POA: Diagnosis not present

## 2022-01-21 DIAGNOSIS — M47816 Spondylosis without myelopathy or radiculopathy, lumbar region: Secondary | ICD-10-CM | POA: Diagnosis not present

## 2022-01-21 DIAGNOSIS — I70201 Unspecified atherosclerosis of native arteries of extremities, right leg: Secondary | ICD-10-CM | POA: Diagnosis not present

## 2022-01-21 DIAGNOSIS — I10 Essential (primary) hypertension: Secondary | ICD-10-CM | POA: Diagnosis not present

## 2022-01-21 DIAGNOSIS — Z96641 Presence of right artificial hip joint: Secondary | ICD-10-CM | POA: Diagnosis not present

## 2022-01-21 DIAGNOSIS — G20A1 Parkinson's disease without dyskinesia, without mention of fluctuations: Secondary | ICD-10-CM | POA: Diagnosis not present

## 2022-01-21 DIAGNOSIS — Z9842 Cataract extraction status, left eye: Secondary | ICD-10-CM | POA: Diagnosis not present

## 2022-01-21 DIAGNOSIS — E43 Unspecified severe protein-calorie malnutrition: Secondary | ICD-10-CM | POA: Diagnosis not present

## 2022-01-21 DIAGNOSIS — Z87440 Personal history of urinary (tract) infections: Secondary | ICD-10-CM | POA: Diagnosis not present

## 2022-01-21 DIAGNOSIS — D509 Iron deficiency anemia, unspecified: Secondary | ICD-10-CM | POA: Diagnosis not present

## 2022-01-21 DIAGNOSIS — R339 Retention of urine, unspecified: Secondary | ICD-10-CM | POA: Diagnosis not present

## 2022-01-21 DIAGNOSIS — Z466 Encounter for fitting and adjustment of urinary device: Secondary | ICD-10-CM | POA: Diagnosis not present

## 2022-01-21 DIAGNOSIS — Z7401 Bed confinement status: Secondary | ICD-10-CM | POA: Diagnosis not present

## 2022-01-21 DIAGNOSIS — I2510 Atherosclerotic heart disease of native coronary artery without angina pectoris: Secondary | ICD-10-CM | POA: Diagnosis not present

## 2022-01-21 DIAGNOSIS — E1151 Type 2 diabetes mellitus with diabetic peripheral angiopathy without gangrene: Secondary | ICD-10-CM | POA: Diagnosis not present

## 2022-01-21 LAB — CUP PACEART REMOTE DEVICE CHECK
Date Time Interrogation Session: 20231022230953
Implantable Pulse Generator Implant Date: 20210830

## 2022-01-22 NOTE — Progress Notes (Addendum)
Close encounter 

## 2022-01-22 NOTE — Progress Notes (Unsigned)
Virtual Visit Via Video       Consent was obtained for video visit:  {yes no:314532} Answered questions that patient had about telehealth interaction:  {yes no:314532} I discussed the limitations, risks, security and privacy concerns of performing an evaluation and management service by telemedicine. I also discussed with the patient that there may be a patient responsible charge related to this service. The patient expressed understanding and agreed to proceed.  Pt location: Home Physician Location: office Name of referring provider:  Lujean Amel, MD I connected with Rodney Love at patients initiation/request on 01/24/2022 at  1:00 PM EDT by video enabled telemedicine application and verified that I am speaking with the correct person using two identifiers. Pt MRN:  [id number] Pt DOB:  06-01-1945 Video Participants:  Rodney Love;  ***wife  Assessment/Plan:   1.  Parkinsons Disease             -Continue carbidopa/levodopa 25/100 CR, 1.5 tablet 4 times per day but change timing of med to 7am/10am/1pm/4pm (he is going to bed at 6pm).             -continue carbidopa/levodopa 50/200 CR at bed             -Unfortunately, after he fractured his hip, he really has not gained the ability to walk again.  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    2.  GAD/depression             -Refuses Lexapro   3.  B12 deficiency             -takes B12 supplement   4.  RLS             -On iron supplementation for iron deficiency.   5.  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    For the left hand, the patient just needs an Occupational Therapy sponge or even washcloth so that the patient's nails do not begin to him.  They state that [facility] told him that they could not help him.  I understand that they would not be able to do much physical therapy with him, but it is unusual that  OT could not at least provide him some basic resources.  Again, I am having my social worker try to reach out.  -wife asks about the right foot turning in.  I really think this is more contracture.  It does not appear to be dystonic.  This is the same hip that had surgery on it.  I am not sure if Botox would be helpful if it is all contracture, but we did discuss potential utility.  They would think about that.  It is very difficult to get the patient out of the home.  6.  They are no longer able to get the patient here in person.  He is following up via video visits every 6 to 9 months.  Subjective:   Rodney Love was seen today in follow up for Parkinsons disease.  My previous records were reviewed prior to todays visit as well as outside records available to me.  Patient wife present and supplements history.  We slightly increased his levodopa last visit, and changed the timing of the dosing of medication, primarily because he was going to bed at 6 PM.  I was really concerned last  visit, because the patient has not ever gained ability to walk following a hip fracture, and was concerned about patient's wifes ability to caregive for him.  She could not move him because of her own medical issues and could not use the [place] lift because it was not electric.  We wrote for 1 that was electric, but did not feel confident that insurance would pay for it.  They report today that ***.  Medical record review indicates patient was in the hospital in July for acute kidney injury and urinary retention and community-acquired pneumonia.  He was not out of the hospital for very long (discharged on July 24) when he returned to the emergency room on August 4 because of distended abdomen.  He was admitted to the hospital until August 7 with constipation.   Current prescribed movement disorder medications: Carbidopa/levodopa 25/100 CR, 1.5 tablet 4 times per day (7 AM/10 AM/1 PM/4 PM) Carbidopa/levodopa 50/200 at  bedtime Quetiapine (stopped last visit, as having daytime hypersomnolence.  Did not think this was contributing as he was only on 12.5 mg). B12 supplement  Ferrous sulfate, 325 mg twice per day with vitamin C   Prior meds: Quetiapine (only on 12.5 mg, but I stopped because patient was having daytime hypersomnolence, even though I did not think it was related)   ALLERGIES:  No Known Allergies  CURRENT MEDICATIONS:  Outpatient Encounter Medications as of 01/24/2022  Medication Sig   acetaminophen (TYLENOL) 500 MG tablet Take 500 mg by mouth every 6 (six) hours as needed for moderate pain.   aspirin 81 MG EC tablet Take 81 mg by mouth daily.   atorvastatin (LIPITOR) 10 MG tablet TAKE 1 TABLET(10 MG) BY MOUTH DAILY (Patient taking differently: Take 10 mg by mouth daily.)   carbidopa-levodopa (SINEMET CR) 50-200 MG tablet TAKE 1 TABLET BY MOUTH EVERY NIGHT AT BEDTIME   carbidopa-levodopa (SINEMET IR) 25-100 MG tablet Take 1 tablet by mouth See admin instructions. Take 1 tablet by mouth at 7 AM, 10 AM, 1 PM, and 4 PM   cholecalciferol (VITAMIN D3) 25 MCG (1000 UNIT) tablet Take 1,000 Units by mouth daily.   Cyanocobalamin (VITAMIN B 12 PO) Take 1 tablet by mouth daily.   ferrous sulfate 324 MG TBEC Take 324 mg by mouth daily with breakfast.   food thickener (SIMPLYTHICK, NECTAR/LEVEL 2/MILDLY THICK,) GEL Take 1 packet by mouth as needed. (Patient taking differently: Take 1 packet by mouth daily as needed (food prep).)   guaiFENesin (MUCINEX) 600 MG 12 hr tablet Take 600 mg by mouth every 12 (twelve) hours as needed for to loosen phlegm or cough.   lactulose (CHRONULAC) 10 GM/15ML solution Take 45 mLs (30 g total) by mouth 2 (two) times daily.   magnesium oxide (MAG-OX) 400 (240 Mg) MG tablet Take 1 tablet (400 mg total) by mouth daily.   ONETOUCH ULTRA test strip    polyethylene glycol (MIRALAX / GLYCOLAX) 17 g packet Take 17 g by mouth 3 (three) times daily.   Potassium 99 MG TABS Take 99 mg by  mouth daily.   QUEtiapine (SEROQUEL) 25 MG tablet Take 0.5 tablets (12.5 mg total) by mouth at bedtime.   senna (SENOKOT) 8.6 MG TABS tablet Take 2 tablets (17.2 mg total) by mouth 2 (two) times daily.   tamsulosin (FLOMAX) 0.4 MG CAPS capsule Take 1 capsule (0.4 mg total) by mouth daily after supper.   tiZANidine (ZANAFLEX) 4 MG tablet Take 1 tablet (4 mg total) by mouth every 6 (  six) hours as needed for muscle spasms.   [DISCONTINUED] pramipexole (MIRAPEX) 0.125 MG tablet Take 0.125 mg by mouth at bedtime.   Facility-Administered Encounter Medications as of 01/24/2022  Medication   lidocaine-EPINEPHrine (XYLOCAINE W/EPI) 1 %-1:100000 (with pres) injection 10 mL    Objective:   PHYSICAL EXAMINATION:    VITALS:   There were no vitals filed for this visit.   Wt Readings from Last 3 Encounters:  11/02/21 127 lb 13.9 oz (58 kg)  10/15/21 128 lb 8.5 oz (58.3 kg)  06/28/21 164 lb (74.4 kg)     GEN:  The patient appears stated age and is in NAD. HEENT:  Normocephalic, atraumatic.  The mucous membranes are moist.  Neurological examination:  Orientation: The patient is alert and oriented to person/place Cranial nerves: There is good facial symmetry with facial hypomimia. The speech is fluent and clear.  He talks with me coherently.  Soft palate rises symmetrically and there is no tongue deviation. Hearing is intact to conversational tone. Sensation: Sensation is intact to light touch throughout Motor: Strength is at least antigravity x4.  The right hip is internally rotated.  Movement examination: Tone: There is moderate rigidity on the left. Abnormal movements: He has left upper extremity rest tremor. Coordination:  There is decremation mostly with foot taps on the left. Gait and Station: Unable  I have reviewed and interpreted the following labs independently    Chemistry      Component Value Date/Time   NA 139 11/05/2021 0432   K 4.1 11/05/2021 0432   CL 108 11/05/2021  0432   CO2 26 11/05/2021 0432   BUN 18 11/05/2021 0432   CREATININE 0.61 11/05/2021 0432      Component Value Date/Time   CALCIUM 8.9 11/05/2021 0432   ALKPHOS 86 11/03/2021 0534   AST 13 (L) 11/03/2021 0534   ALT 6 11/03/2021 0534   BILITOT 1.0 11/03/2021 0534       Lab Results  Component Value Date   WBC 7.9 11/03/2021   HGB 12.6 (L) 11/03/2021   HCT 38.1 (L) 11/03/2021   MCV 96.5 11/03/2021   PLT 265 11/03/2021    Lab Results  Component Value Date   TSH 4.185 11/03/2021     Total time spent on today's visit was 41 minutes, including both face-to-face time and nonface-to-face time.  Time included that spent on review of records (prior notes available to me/labs/imaging if pertinent), discussing treatment and goals, answering patient's questions and coordinating care.  Cc:  Lujean Amel, MD

## 2022-01-23 DIAGNOSIS — Z7982 Long term (current) use of aspirin: Secondary | ICD-10-CM | POA: Diagnosis not present

## 2022-01-23 DIAGNOSIS — L89152 Pressure ulcer of sacral region, stage 2: Secondary | ICD-10-CM | POA: Diagnosis not present

## 2022-01-23 DIAGNOSIS — Z7401 Bed confinement status: Secondary | ICD-10-CM | POA: Diagnosis not present

## 2022-01-23 DIAGNOSIS — E785 Hyperlipidemia, unspecified: Secondary | ICD-10-CM | POA: Diagnosis not present

## 2022-01-23 DIAGNOSIS — I10 Essential (primary) hypertension: Secondary | ICD-10-CM | POA: Diagnosis not present

## 2022-01-23 DIAGNOSIS — K5900 Constipation, unspecified: Secondary | ICD-10-CM | POA: Diagnosis not present

## 2022-01-23 DIAGNOSIS — R1312 Dysphagia, oropharyngeal phase: Secondary | ICD-10-CM | POA: Diagnosis not present

## 2022-01-23 DIAGNOSIS — Z96641 Presence of right artificial hip joint: Secondary | ICD-10-CM | POA: Diagnosis not present

## 2022-01-23 DIAGNOSIS — E039 Hypothyroidism, unspecified: Secondary | ICD-10-CM | POA: Diagnosis not present

## 2022-01-23 DIAGNOSIS — Z466 Encounter for fitting and adjustment of urinary device: Secondary | ICD-10-CM | POA: Diagnosis not present

## 2022-01-23 DIAGNOSIS — Z87440 Personal history of urinary (tract) infections: Secondary | ICD-10-CM | POA: Diagnosis not present

## 2022-01-23 DIAGNOSIS — R627 Adult failure to thrive: Secondary | ICD-10-CM | POA: Diagnosis not present

## 2022-01-23 DIAGNOSIS — M47816 Spondylosis without myelopathy or radiculopathy, lumbar region: Secondary | ICD-10-CM | POA: Diagnosis not present

## 2022-01-23 DIAGNOSIS — I70201 Unspecified atherosclerosis of native arteries of extremities, right leg: Secondary | ICD-10-CM | POA: Diagnosis not present

## 2022-01-23 DIAGNOSIS — I2510 Atherosclerotic heart disease of native coronary artery without angina pectoris: Secondary | ICD-10-CM | POA: Diagnosis not present

## 2022-01-23 DIAGNOSIS — D509 Iron deficiency anemia, unspecified: Secondary | ICD-10-CM | POA: Diagnosis not present

## 2022-01-23 DIAGNOSIS — E876 Hypokalemia: Secondary | ICD-10-CM | POA: Diagnosis not present

## 2022-01-23 DIAGNOSIS — G20A1 Parkinson's disease without dyskinesia, without mention of fluctuations: Secondary | ICD-10-CM | POA: Diagnosis not present

## 2022-01-23 DIAGNOSIS — Z9842 Cataract extraction status, left eye: Secondary | ICD-10-CM | POA: Diagnosis not present

## 2022-01-23 DIAGNOSIS — I700 Atherosclerosis of aorta: Secondary | ICD-10-CM | POA: Diagnosis not present

## 2022-01-23 DIAGNOSIS — Z95820 Peripheral vascular angioplasty status with implants and grafts: Secondary | ICD-10-CM | POA: Diagnosis not present

## 2022-01-23 DIAGNOSIS — E1151 Type 2 diabetes mellitus with diabetic peripheral angiopathy without gangrene: Secondary | ICD-10-CM | POA: Diagnosis not present

## 2022-01-23 DIAGNOSIS — E43 Unspecified severe protein-calorie malnutrition: Secondary | ICD-10-CM | POA: Diagnosis not present

## 2022-01-23 DIAGNOSIS — R339 Retention of urine, unspecified: Secondary | ICD-10-CM | POA: Diagnosis not present

## 2022-01-24 ENCOUNTER — Telehealth: Payer: Self-pay

## 2022-01-24 ENCOUNTER — Telehealth (INDEPENDENT_AMBULATORY_CARE_PROVIDER_SITE_OTHER): Payer: Medicare Other | Admitting: Neurology

## 2022-01-24 ENCOUNTER — Other Ambulatory Visit: Payer: Self-pay

## 2022-01-24 DIAGNOSIS — G20A1 Parkinson's disease without dyskinesia, without mention of fluctuations: Secondary | ICD-10-CM

## 2022-01-24 MED ORDER — CARBIDOPA-LEVODOPA ER 50-200 MG PO TBCR: 1.0000 | EXTENDED_RELEASE_TABLET | Freq: Every day | ORAL | 2 refills | Status: DC

## 2022-01-24 MED ORDER — AMBULATORY NON FORMULARY MEDICATION: 0 refills | Status: DC

## 2022-01-24 MED ORDER — CARBIDOPA-LEVODOPA 25-100 MG PO TABS: 1.5000 | ORAL_TABLET | Freq: Four times a day (QID) | ORAL | 2 refills | Status: DC

## 2022-01-24 NOTE — Telephone Encounter (Signed)
Electric Hoyer lift order put in and sent message to [employer]

## 2022-01-24 NOTE — Telephone Encounter (Signed)
-----   Message from [place], DO sent at 01/24/2022 12:52 PM EDT ----- Send order to [employer] for electric hoyer life

## 2022-01-25 DIAGNOSIS — Z7982 Long term (current) use of aspirin: Secondary | ICD-10-CM | POA: Diagnosis not present

## 2022-01-25 DIAGNOSIS — E1151 Type 2 diabetes mellitus with diabetic peripheral angiopathy without gangrene: Secondary | ICD-10-CM | POA: Diagnosis not present

## 2022-01-25 DIAGNOSIS — I70201 Unspecified atherosclerosis of native arteries of extremities, right leg: Secondary | ICD-10-CM | POA: Diagnosis not present

## 2022-01-25 DIAGNOSIS — E876 Hypokalemia: Secondary | ICD-10-CM | POA: Diagnosis not present

## 2022-01-25 DIAGNOSIS — E039 Hypothyroidism, unspecified: Secondary | ICD-10-CM | POA: Diagnosis not present

## 2022-01-25 DIAGNOSIS — Z87440 Personal history of urinary (tract) infections: Secondary | ICD-10-CM | POA: Diagnosis not present

## 2022-01-25 DIAGNOSIS — Z95820 Peripheral vascular angioplasty status with implants and grafts: Secondary | ICD-10-CM | POA: Diagnosis not present

## 2022-01-25 DIAGNOSIS — R1312 Dysphagia, oropharyngeal phase: Secondary | ICD-10-CM | POA: Diagnosis not present

## 2022-01-25 DIAGNOSIS — L89152 Pressure ulcer of sacral region, stage 2: Secondary | ICD-10-CM | POA: Diagnosis not present

## 2022-01-25 DIAGNOSIS — G20A1 Parkinson's disease without dyskinesia, without mention of fluctuations: Secondary | ICD-10-CM | POA: Diagnosis not present

## 2022-01-25 DIAGNOSIS — Z7401 Bed confinement status: Secondary | ICD-10-CM | POA: Diagnosis not present

## 2022-01-25 DIAGNOSIS — I10 Essential (primary) hypertension: Secondary | ICD-10-CM | POA: Diagnosis not present

## 2022-01-25 DIAGNOSIS — E785 Hyperlipidemia, unspecified: Secondary | ICD-10-CM | POA: Diagnosis not present

## 2022-01-25 DIAGNOSIS — R627 Adult failure to thrive: Secondary | ICD-10-CM | POA: Diagnosis not present

## 2022-01-25 DIAGNOSIS — D509 Iron deficiency anemia, unspecified: Secondary | ICD-10-CM | POA: Diagnosis not present

## 2022-01-25 DIAGNOSIS — I700 Atherosclerosis of aorta: Secondary | ICD-10-CM | POA: Diagnosis not present

## 2022-01-25 DIAGNOSIS — M47816 Spondylosis without myelopathy or radiculopathy, lumbar region: Secondary | ICD-10-CM | POA: Diagnosis not present

## 2022-01-25 DIAGNOSIS — R339 Retention of urine, unspecified: Secondary | ICD-10-CM | POA: Diagnosis not present

## 2022-01-25 DIAGNOSIS — Z466 Encounter for fitting and adjustment of urinary device: Secondary | ICD-10-CM | POA: Diagnosis not present

## 2022-01-25 DIAGNOSIS — E43 Unspecified severe protein-calorie malnutrition: Secondary | ICD-10-CM | POA: Diagnosis not present

## 2022-01-25 DIAGNOSIS — I2510 Atherosclerotic heart disease of native coronary artery without angina pectoris: Secondary | ICD-10-CM | POA: Diagnosis not present

## 2022-01-25 DIAGNOSIS — K5900 Constipation, unspecified: Secondary | ICD-10-CM | POA: Diagnosis not present

## 2022-01-25 DIAGNOSIS — Z96641 Presence of right artificial hip joint: Secondary | ICD-10-CM | POA: Diagnosis not present

## 2022-01-25 DIAGNOSIS — Z9842 Cataract extraction status, left eye: Secondary | ICD-10-CM | POA: Diagnosis not present

## 2022-01-30 DIAGNOSIS — E876 Hypokalemia: Secondary | ICD-10-CM | POA: Diagnosis not present

## 2022-01-30 DIAGNOSIS — M47816 Spondylosis without myelopathy or radiculopathy, lumbar region: Secondary | ICD-10-CM | POA: Diagnosis not present

## 2022-01-30 DIAGNOSIS — Z7401 Bed confinement status: Secondary | ICD-10-CM | POA: Diagnosis not present

## 2022-01-30 DIAGNOSIS — I70201 Unspecified atherosclerosis of native arteries of extremities, right leg: Secondary | ICD-10-CM | POA: Diagnosis not present

## 2022-01-30 DIAGNOSIS — R627 Adult failure to thrive: Secondary | ICD-10-CM | POA: Diagnosis not present

## 2022-01-30 DIAGNOSIS — D509 Iron deficiency anemia, unspecified: Secondary | ICD-10-CM | POA: Diagnosis not present

## 2022-01-30 DIAGNOSIS — E43 Unspecified severe protein-calorie malnutrition: Secondary | ICD-10-CM | POA: Diagnosis not present

## 2022-01-30 DIAGNOSIS — L89152 Pressure ulcer of sacral region, stage 2: Secondary | ICD-10-CM | POA: Diagnosis not present

## 2022-01-30 DIAGNOSIS — E039 Hypothyroidism, unspecified: Secondary | ICD-10-CM | POA: Diagnosis not present

## 2022-01-30 DIAGNOSIS — Z96641 Presence of right artificial hip joint: Secondary | ICD-10-CM | POA: Diagnosis not present

## 2022-01-30 DIAGNOSIS — Z9842 Cataract extraction status, left eye: Secondary | ICD-10-CM | POA: Diagnosis not present

## 2022-01-30 DIAGNOSIS — Z95820 Peripheral vascular angioplasty status with implants and grafts: Secondary | ICD-10-CM | POA: Diagnosis not present

## 2022-01-30 DIAGNOSIS — I2510 Atherosclerotic heart disease of native coronary artery without angina pectoris: Secondary | ICD-10-CM | POA: Diagnosis not present

## 2022-01-30 DIAGNOSIS — K5900 Constipation, unspecified: Secondary | ICD-10-CM | POA: Diagnosis not present

## 2022-01-30 DIAGNOSIS — G20A1 Parkinson's disease without dyskinesia, without mention of fluctuations: Secondary | ICD-10-CM | POA: Diagnosis not present

## 2022-01-30 DIAGNOSIS — E1151 Type 2 diabetes mellitus with diabetic peripheral angiopathy without gangrene: Secondary | ICD-10-CM | POA: Diagnosis not present

## 2022-01-30 DIAGNOSIS — I10 Essential (primary) hypertension: Secondary | ICD-10-CM | POA: Diagnosis not present

## 2022-01-30 DIAGNOSIS — E785 Hyperlipidemia, unspecified: Secondary | ICD-10-CM | POA: Diagnosis not present

## 2022-01-30 DIAGNOSIS — R1312 Dysphagia, oropharyngeal phase: Secondary | ICD-10-CM | POA: Diagnosis not present

## 2022-01-30 DIAGNOSIS — Z466 Encounter for fitting and adjustment of urinary device: Secondary | ICD-10-CM | POA: Diagnosis not present

## 2022-01-30 DIAGNOSIS — Z87440 Personal history of urinary (tract) infections: Secondary | ICD-10-CM | POA: Diagnosis not present

## 2022-01-30 DIAGNOSIS — I700 Atherosclerosis of aorta: Secondary | ICD-10-CM | POA: Diagnosis not present

## 2022-01-30 DIAGNOSIS — Z7982 Long term (current) use of aspirin: Secondary | ICD-10-CM | POA: Diagnosis not present

## 2022-01-30 DIAGNOSIS — R339 Retention of urine, unspecified: Secondary | ICD-10-CM | POA: Diagnosis not present

## 2022-02-01 DIAGNOSIS — G20A1 Parkinson's disease without dyskinesia, without mention of fluctuations: Secondary | ICD-10-CM | POA: Diagnosis not present

## 2022-02-01 DIAGNOSIS — I10 Essential (primary) hypertension: Secondary | ICD-10-CM | POA: Diagnosis not present

## 2022-02-01 DIAGNOSIS — E876 Hypokalemia: Secondary | ICD-10-CM | POA: Diagnosis not present

## 2022-02-01 DIAGNOSIS — E43 Unspecified severe protein-calorie malnutrition: Secondary | ICD-10-CM | POA: Diagnosis not present

## 2022-02-01 DIAGNOSIS — L89152 Pressure ulcer of sacral region, stage 2: Secondary | ICD-10-CM | POA: Diagnosis not present

## 2022-02-01 DIAGNOSIS — R1312 Dysphagia, oropharyngeal phase: Secondary | ICD-10-CM | POA: Diagnosis not present

## 2022-02-01 DIAGNOSIS — Z87440 Personal history of urinary (tract) infections: Secondary | ICD-10-CM | POA: Diagnosis not present

## 2022-02-01 DIAGNOSIS — D509 Iron deficiency anemia, unspecified: Secondary | ICD-10-CM | POA: Diagnosis not present

## 2022-02-01 DIAGNOSIS — Z9842 Cataract extraction status, left eye: Secondary | ICD-10-CM | POA: Diagnosis not present

## 2022-02-01 DIAGNOSIS — Z7401 Bed confinement status: Secondary | ICD-10-CM | POA: Diagnosis not present

## 2022-02-01 DIAGNOSIS — R339 Retention of urine, unspecified: Secondary | ICD-10-CM | POA: Diagnosis not present

## 2022-02-01 DIAGNOSIS — E1151 Type 2 diabetes mellitus with diabetic peripheral angiopathy without gangrene: Secondary | ICD-10-CM | POA: Diagnosis not present

## 2022-02-01 DIAGNOSIS — Z96641 Presence of right artificial hip joint: Secondary | ICD-10-CM | POA: Diagnosis not present

## 2022-02-01 DIAGNOSIS — I70201 Unspecified atherosclerosis of native arteries of extremities, right leg: Secondary | ICD-10-CM | POA: Diagnosis not present

## 2022-02-01 DIAGNOSIS — K5900 Constipation, unspecified: Secondary | ICD-10-CM | POA: Diagnosis not present

## 2022-02-01 DIAGNOSIS — E785 Hyperlipidemia, unspecified: Secondary | ICD-10-CM | POA: Diagnosis not present

## 2022-02-01 DIAGNOSIS — M47816 Spondylosis without myelopathy or radiculopathy, lumbar region: Secondary | ICD-10-CM | POA: Diagnosis not present

## 2022-02-01 DIAGNOSIS — Z95820 Peripheral vascular angioplasty status with implants and grafts: Secondary | ICD-10-CM | POA: Diagnosis not present

## 2022-02-01 DIAGNOSIS — Z7982 Long term (current) use of aspirin: Secondary | ICD-10-CM | POA: Diagnosis not present

## 2022-02-01 DIAGNOSIS — I700 Atherosclerosis of aorta: Secondary | ICD-10-CM | POA: Diagnosis not present

## 2022-02-01 DIAGNOSIS — I2510 Atherosclerotic heart disease of native coronary artery without angina pectoris: Secondary | ICD-10-CM | POA: Diagnosis not present

## 2022-02-01 DIAGNOSIS — Z466 Encounter for fitting and adjustment of urinary device: Secondary | ICD-10-CM | POA: Diagnosis not present

## 2022-02-01 DIAGNOSIS — R627 Adult failure to thrive: Secondary | ICD-10-CM | POA: Diagnosis not present

## 2022-02-01 DIAGNOSIS — E039 Hypothyroidism, unspecified: Secondary | ICD-10-CM | POA: Diagnosis not present

## 2022-02-04 DIAGNOSIS — E785 Hyperlipidemia, unspecified: Secondary | ICD-10-CM | POA: Diagnosis not present

## 2022-02-04 DIAGNOSIS — Z95820 Peripheral vascular angioplasty status with implants and grafts: Secondary | ICD-10-CM | POA: Diagnosis not present

## 2022-02-04 DIAGNOSIS — K5900 Constipation, unspecified: Secondary | ICD-10-CM | POA: Diagnosis not present

## 2022-02-04 DIAGNOSIS — E876 Hypokalemia: Secondary | ICD-10-CM | POA: Diagnosis not present

## 2022-02-04 DIAGNOSIS — R339 Retention of urine, unspecified: Secondary | ICD-10-CM | POA: Diagnosis not present

## 2022-02-04 DIAGNOSIS — E43 Unspecified severe protein-calorie malnutrition: Secondary | ICD-10-CM | POA: Diagnosis not present

## 2022-02-04 DIAGNOSIS — G20A1 Parkinson's disease without dyskinesia, without mention of fluctuations: Secondary | ICD-10-CM | POA: Diagnosis not present

## 2022-02-04 DIAGNOSIS — E1151 Type 2 diabetes mellitus with diabetic peripheral angiopathy without gangrene: Secondary | ICD-10-CM | POA: Diagnosis not present

## 2022-02-04 DIAGNOSIS — E039 Hypothyroidism, unspecified: Secondary | ICD-10-CM | POA: Diagnosis not present

## 2022-02-04 DIAGNOSIS — Z9842 Cataract extraction status, left eye: Secondary | ICD-10-CM | POA: Diagnosis not present

## 2022-02-04 DIAGNOSIS — I700 Atherosclerosis of aorta: Secondary | ICD-10-CM | POA: Diagnosis not present

## 2022-02-04 DIAGNOSIS — Z466 Encounter for fitting and adjustment of urinary device: Secondary | ICD-10-CM | POA: Diagnosis not present

## 2022-02-04 DIAGNOSIS — R1312 Dysphagia, oropharyngeal phase: Secondary | ICD-10-CM | POA: Diagnosis not present

## 2022-02-04 DIAGNOSIS — I70201 Unspecified atherosclerosis of native arteries of extremities, right leg: Secondary | ICD-10-CM | POA: Diagnosis not present

## 2022-02-04 DIAGNOSIS — Z7401 Bed confinement status: Secondary | ICD-10-CM | POA: Diagnosis not present

## 2022-02-04 DIAGNOSIS — Z7982 Long term (current) use of aspirin: Secondary | ICD-10-CM | POA: Diagnosis not present

## 2022-02-04 DIAGNOSIS — M47816 Spondylosis without myelopathy or radiculopathy, lumbar region: Secondary | ICD-10-CM | POA: Diagnosis not present

## 2022-02-04 DIAGNOSIS — Z96641 Presence of right artificial hip joint: Secondary | ICD-10-CM | POA: Diagnosis not present

## 2022-02-04 DIAGNOSIS — I2510 Atherosclerotic heart disease of native coronary artery without angina pectoris: Secondary | ICD-10-CM | POA: Diagnosis not present

## 2022-02-04 DIAGNOSIS — I10 Essential (primary) hypertension: Secondary | ICD-10-CM | POA: Diagnosis not present

## 2022-02-04 DIAGNOSIS — L89152 Pressure ulcer of sacral region, stage 2: Secondary | ICD-10-CM | POA: Diagnosis not present

## 2022-02-04 DIAGNOSIS — Z87440 Personal history of urinary (tract) infections: Secondary | ICD-10-CM | POA: Diagnosis not present

## 2022-02-04 DIAGNOSIS — R627 Adult failure to thrive: Secondary | ICD-10-CM | POA: Diagnosis not present

## 2022-02-04 DIAGNOSIS — G20C Parkinsonism, unspecified: Secondary | ICD-10-CM | POA: Diagnosis not present

## 2022-02-04 DIAGNOSIS — D509 Iron deficiency anemia, unspecified: Secondary | ICD-10-CM | POA: Diagnosis not present

## 2022-02-05 DIAGNOSIS — Z9181 History of falling: Secondary | ICD-10-CM | POA: Diagnosis not present

## 2022-02-05 DIAGNOSIS — L89154 Pressure ulcer of sacral region, stage 4: Secondary | ICD-10-CM | POA: Diagnosis not present

## 2022-02-09 DIAGNOSIS — Z466 Encounter for fitting and adjustment of urinary device: Secondary | ICD-10-CM | POA: Diagnosis not present

## 2022-02-09 DIAGNOSIS — Z96641 Presence of right artificial hip joint: Secondary | ICD-10-CM | POA: Diagnosis not present

## 2022-02-09 DIAGNOSIS — I700 Atherosclerosis of aorta: Secondary | ICD-10-CM | POA: Diagnosis not present

## 2022-02-09 DIAGNOSIS — Z9842 Cataract extraction status, left eye: Secondary | ICD-10-CM | POA: Diagnosis not present

## 2022-02-09 DIAGNOSIS — E1151 Type 2 diabetes mellitus with diabetic peripheral angiopathy without gangrene: Secondary | ICD-10-CM | POA: Diagnosis not present

## 2022-02-09 DIAGNOSIS — G20A1 Parkinson's disease without dyskinesia, without mention of fluctuations: Secondary | ICD-10-CM | POA: Diagnosis not present

## 2022-02-09 DIAGNOSIS — Z87440 Personal history of urinary (tract) infections: Secondary | ICD-10-CM | POA: Diagnosis not present

## 2022-02-09 DIAGNOSIS — R627 Adult failure to thrive: Secondary | ICD-10-CM | POA: Diagnosis not present

## 2022-02-09 DIAGNOSIS — Z7982 Long term (current) use of aspirin: Secondary | ICD-10-CM | POA: Diagnosis not present

## 2022-02-09 DIAGNOSIS — E43 Unspecified severe protein-calorie malnutrition: Secondary | ICD-10-CM | POA: Diagnosis not present

## 2022-02-09 DIAGNOSIS — E785 Hyperlipidemia, unspecified: Secondary | ICD-10-CM | POA: Diagnosis not present

## 2022-02-09 DIAGNOSIS — M47816 Spondylosis without myelopathy or radiculopathy, lumbar region: Secondary | ICD-10-CM | POA: Diagnosis not present

## 2022-02-09 DIAGNOSIS — I10 Essential (primary) hypertension: Secondary | ICD-10-CM | POA: Diagnosis not present

## 2022-02-09 DIAGNOSIS — I2510 Atherosclerotic heart disease of native coronary artery without angina pectoris: Secondary | ICD-10-CM | POA: Diagnosis not present

## 2022-02-09 DIAGNOSIS — I70201 Unspecified atherosclerosis of native arteries of extremities, right leg: Secondary | ICD-10-CM | POA: Diagnosis not present

## 2022-02-09 DIAGNOSIS — E876 Hypokalemia: Secondary | ICD-10-CM | POA: Diagnosis not present

## 2022-02-09 DIAGNOSIS — L89152 Pressure ulcer of sacral region, stage 2: Secondary | ICD-10-CM | POA: Diagnosis not present

## 2022-02-09 DIAGNOSIS — Z95820 Peripheral vascular angioplasty status with implants and grafts: Secondary | ICD-10-CM | POA: Diagnosis not present

## 2022-02-09 DIAGNOSIS — Z7401 Bed confinement status: Secondary | ICD-10-CM | POA: Diagnosis not present

## 2022-02-09 DIAGNOSIS — D509 Iron deficiency anemia, unspecified: Secondary | ICD-10-CM | POA: Diagnosis not present

## 2022-02-09 DIAGNOSIS — K5900 Constipation, unspecified: Secondary | ICD-10-CM | POA: Diagnosis not present

## 2022-02-09 DIAGNOSIS — R1312 Dysphagia, oropharyngeal phase: Secondary | ICD-10-CM | POA: Diagnosis not present

## 2022-02-09 DIAGNOSIS — E039 Hypothyroidism, unspecified: Secondary | ICD-10-CM | POA: Diagnosis not present

## 2022-02-09 DIAGNOSIS — R339 Retention of urine, unspecified: Secondary | ICD-10-CM | POA: Diagnosis not present

## 2022-02-11 NOTE — Progress Notes (Signed)
Carelink Summary Report / Loop Recorder 

## 2022-02-12 ENCOUNTER — Encounter: Payer: Self-pay | Admitting: Neurology

## 2022-02-12 DIAGNOSIS — I10 Essential (primary) hypertension: Secondary | ICD-10-CM | POA: Diagnosis not present

## 2022-02-12 DIAGNOSIS — Z9842 Cataract extraction status, left eye: Secondary | ICD-10-CM | POA: Diagnosis not present

## 2022-02-12 DIAGNOSIS — G20A1 Parkinson's disease without dyskinesia, without mention of fluctuations: Secondary | ICD-10-CM | POA: Diagnosis not present

## 2022-02-12 DIAGNOSIS — D509 Iron deficiency anemia, unspecified: Secondary | ICD-10-CM | POA: Diagnosis not present

## 2022-02-12 DIAGNOSIS — R339 Retention of urine, unspecified: Secondary | ICD-10-CM | POA: Diagnosis not present

## 2022-02-12 DIAGNOSIS — E1151 Type 2 diabetes mellitus with diabetic peripheral angiopathy without gangrene: Secondary | ICD-10-CM | POA: Diagnosis not present

## 2022-02-12 DIAGNOSIS — L89152 Pressure ulcer of sacral region, stage 2: Secondary | ICD-10-CM | POA: Diagnosis not present

## 2022-02-12 DIAGNOSIS — I700 Atherosclerosis of aorta: Secondary | ICD-10-CM | POA: Diagnosis not present

## 2022-02-12 DIAGNOSIS — Z7401 Bed confinement status: Secondary | ICD-10-CM | POA: Diagnosis not present

## 2022-02-12 DIAGNOSIS — Z7982 Long term (current) use of aspirin: Secondary | ICD-10-CM | POA: Diagnosis not present

## 2022-02-12 DIAGNOSIS — K5900 Constipation, unspecified: Secondary | ICD-10-CM | POA: Diagnosis not present

## 2022-02-12 DIAGNOSIS — R1312 Dysphagia, oropharyngeal phase: Secondary | ICD-10-CM | POA: Diagnosis not present

## 2022-02-12 DIAGNOSIS — I70201 Unspecified atherosclerosis of native arteries of extremities, right leg: Secondary | ICD-10-CM | POA: Diagnosis not present

## 2022-02-12 DIAGNOSIS — Z95820 Peripheral vascular angioplasty status with implants and grafts: Secondary | ICD-10-CM | POA: Diagnosis not present

## 2022-02-12 DIAGNOSIS — Z87440 Personal history of urinary (tract) infections: Secondary | ICD-10-CM | POA: Diagnosis not present

## 2022-02-12 DIAGNOSIS — M47816 Spondylosis without myelopathy or radiculopathy, lumbar region: Secondary | ICD-10-CM | POA: Diagnosis not present

## 2022-02-12 DIAGNOSIS — Z466 Encounter for fitting and adjustment of urinary device: Secondary | ICD-10-CM | POA: Diagnosis not present

## 2022-02-12 DIAGNOSIS — E785 Hyperlipidemia, unspecified: Secondary | ICD-10-CM | POA: Diagnosis not present

## 2022-02-12 DIAGNOSIS — E876 Hypokalemia: Secondary | ICD-10-CM | POA: Diagnosis not present

## 2022-02-12 DIAGNOSIS — I2510 Atherosclerotic heart disease of native coronary artery without angina pectoris: Secondary | ICD-10-CM | POA: Diagnosis not present

## 2022-02-12 DIAGNOSIS — E039 Hypothyroidism, unspecified: Secondary | ICD-10-CM | POA: Diagnosis not present

## 2022-02-12 DIAGNOSIS — Z96641 Presence of right artificial hip joint: Secondary | ICD-10-CM | POA: Diagnosis not present

## 2022-02-12 DIAGNOSIS — E43 Unspecified severe protein-calorie malnutrition: Secondary | ICD-10-CM | POA: Diagnosis not present

## 2022-02-12 DIAGNOSIS — R627 Adult failure to thrive: Secondary | ICD-10-CM | POA: Diagnosis not present

## 2022-02-13 DIAGNOSIS — Z96641 Presence of right artificial hip joint: Secondary | ICD-10-CM | POA: Diagnosis not present

## 2022-02-13 DIAGNOSIS — Z7401 Bed confinement status: Secondary | ICD-10-CM | POA: Diagnosis not present

## 2022-02-13 DIAGNOSIS — R1312 Dysphagia, oropharyngeal phase: Secondary | ICD-10-CM | POA: Diagnosis not present

## 2022-02-13 DIAGNOSIS — D509 Iron deficiency anemia, unspecified: Secondary | ICD-10-CM | POA: Diagnosis not present

## 2022-02-13 DIAGNOSIS — L89152 Pressure ulcer of sacral region, stage 2: Secondary | ICD-10-CM | POA: Diagnosis not present

## 2022-02-13 DIAGNOSIS — Z466 Encounter for fitting and adjustment of urinary device: Secondary | ICD-10-CM | POA: Diagnosis not present

## 2022-02-13 DIAGNOSIS — I70201 Unspecified atherosclerosis of native arteries of extremities, right leg: Secondary | ICD-10-CM | POA: Diagnosis not present

## 2022-02-13 DIAGNOSIS — Z9842 Cataract extraction status, left eye: Secondary | ICD-10-CM | POA: Diagnosis not present

## 2022-02-13 DIAGNOSIS — I700 Atherosclerosis of aorta: Secondary | ICD-10-CM | POA: Diagnosis not present

## 2022-02-13 DIAGNOSIS — I2510 Atherosclerotic heart disease of native coronary artery without angina pectoris: Secondary | ICD-10-CM | POA: Diagnosis not present

## 2022-02-13 DIAGNOSIS — Z7982 Long term (current) use of aspirin: Secondary | ICD-10-CM | POA: Diagnosis not present

## 2022-02-13 DIAGNOSIS — Z87440 Personal history of urinary (tract) infections: Secondary | ICD-10-CM | POA: Diagnosis not present

## 2022-02-13 DIAGNOSIS — E43 Unspecified severe protein-calorie malnutrition: Secondary | ICD-10-CM | POA: Diagnosis not present

## 2022-02-13 DIAGNOSIS — R339 Retention of urine, unspecified: Secondary | ICD-10-CM | POA: Diagnosis not present

## 2022-02-13 DIAGNOSIS — G20A1 Parkinson's disease without dyskinesia, without mention of fluctuations: Secondary | ICD-10-CM | POA: Diagnosis not present

## 2022-02-13 DIAGNOSIS — E785 Hyperlipidemia, unspecified: Secondary | ICD-10-CM | POA: Diagnosis not present

## 2022-02-13 DIAGNOSIS — E1151 Type 2 diabetes mellitus with diabetic peripheral angiopathy without gangrene: Secondary | ICD-10-CM | POA: Diagnosis not present

## 2022-02-13 DIAGNOSIS — R627 Adult failure to thrive: Secondary | ICD-10-CM | POA: Diagnosis not present

## 2022-02-13 DIAGNOSIS — E876 Hypokalemia: Secondary | ICD-10-CM | POA: Diagnosis not present

## 2022-02-13 DIAGNOSIS — M47816 Spondylosis without myelopathy or radiculopathy, lumbar region: Secondary | ICD-10-CM | POA: Diagnosis not present

## 2022-02-13 DIAGNOSIS — K5900 Constipation, unspecified: Secondary | ICD-10-CM | POA: Diagnosis not present

## 2022-02-13 DIAGNOSIS — Z95820 Peripheral vascular angioplasty status with implants and grafts: Secondary | ICD-10-CM | POA: Diagnosis not present

## 2022-02-13 DIAGNOSIS — E039 Hypothyroidism, unspecified: Secondary | ICD-10-CM | POA: Diagnosis not present

## 2022-02-13 DIAGNOSIS — I10 Essential (primary) hypertension: Secondary | ICD-10-CM | POA: Diagnosis not present

## 2022-02-13 NOTE — Telephone Encounter (Signed)
I seen where [place] placed the order I have reached out to [place] waiting to hear from him where we stand with getting the lift to call the pt daughter with an update,

## 2022-02-15 ENCOUNTER — Telehealth: Payer: Self-pay | Admitting: *Deleted

## 2022-02-15 ENCOUNTER — Encounter: Payer: Self-pay | Admitting: *Deleted

## 2022-02-15 DIAGNOSIS — L89152 Pressure ulcer of sacral region, stage 2: Secondary | ICD-10-CM | POA: Diagnosis not present

## 2022-02-15 NOTE — Telephone Encounter (Signed)
Linden responded that he is forwarding to [facility] who handles Hoyer lifts to assist with this patient

## 2022-02-15 NOTE — Patient Instructions (Signed)
Visit Information  Thank you for taking time to visit with me today. Please don't hesitate to contact me if I can be of assistance to you.   Following are the goals we discussed today:   Goals Addressed               This Visit's Progress     COMPLETED: "wounds to buttom getting worse" (pt-stated)        Care Coordination Interventions: Spoke with spouse [name], pt unable to communicate Advised patient to consider a high level of care  (nursing home) if she and family are unable to accommodate pt's ongoing care (pt is total care). She has indicated she would possible discuss with her children in the future is necessary. Provided education to patient re: to the spouse [name]) on levels of care and what care can be provided at such facilities.  Reviewed medications with patient and discussed adherence with no needed refills Reviewed scheduled/upcoming provider appointments including pending appointments with verification of EMS transport services due to pt's non ambulatory status Assessed social determinant of health barriers Wife also indicated pt has decubitus ulcers to his sacral area that are currently being address with [facility] home health with visiting RN for wound care. Wife indicates RN has called the provider for orders for pt to go to the wound care clinic for further interventions (pending) and RN will continue to visit twice weekly for ongoing wound care in the home.  Wife declined social work consult however RN also spoke with [facility] social worker for any other resources however limited based upon pt's current involvement with HH other then the presented increase in level of care for possible placement/hospice. Wife informed to call this RN if she and her family decide placing pt into a nursing home for further care via MyChart or through her provider's office. No additional needs at this time as spouse [name]) was very appreciative for the information and call today.            Please call the care guide team at [phone] if you need to cancel or reschedule your appointment.   If you are experiencing a Mental Health or [place] or need someone to talk to, please call the Suicide and Crisis Lifeline: 988 call the [place] National Suicide Prevention Lifeline: 873 636 5151 or TTY: 816-534-5046 TTY (507)376-3876) to talk to a trained counselor call 1-800-273-TALK (toll free, 24 hour hotline)  Patient verbalizes understanding of instructions and care plan provided today and agrees to view in [place]. Active MyChart status and patient understanding of how to access instructions and care plan via MyChart confirmed with patient.     No further follow up required: No further needs at this time  Raina Mina, RN Care Management Coordinator [place] Office [phone]

## 2022-02-15 NOTE — Patient Outreach (Signed)
  Care Coordination   Initial Visit Note   02/15/2022 Name: Rodney Love MRN: [id number] DOB: Jun 01, 1945  Rodney Love is a 76 y.o. year old male who sees Koirala, Dibas, MD for primary care. I spoke with wife Rodney Love) by phone today.  What matters to the patients health and wellness today?  Wound care    Goals Addressed               This Visit's Progress     COMPLETED: "wounds to buttom getting worse" (pt-stated)        Care Coordination Interventions: Spoke with spouse Rodney Love, pt unable to communicate Advised patient to consider a high level of care  (nursing home) if she and family are unable to accommodate pt's ongoing care (pt is total care). She has indicated she would possible discuss with her children in the future is necessary. Provided education to patient re: to the spouse Rodney Love) on levels of care and what care can be provided at such facilities.  Reviewed medications with patient and discussed adherence with no needed refills Reviewed scheduled/upcoming provider appointments including pending appointments with verification of EMS transport services due to pt's non ambulatory status Assessed social determinant of health barriers Wife also indicated pt has decubitus ulcers to his sacral area that are currently being address with [facility] home health with visiting RN for wound care. Wife indicates RN has called the provider for orders for pt to go to the wound care clinic for further interventions (pending) and RN will continue to visit twice weekly for ongoing wound care in the home.  Wife declined social work consult however RN also spoke with [facility] social worker for any other resources however limited based upon pt's current involvement with HH other then the presented increase in level of care for possible placement/hospice. Wife informed to call this RN if she and her family decide placing pt into a nursing home for further care via MyChart or through her provider's  office. No additional needs at this time as spouse Rodney Love) was very appreciative for the information and call today.           SDOH assessments and interventions completed:  Yes  SDOH Interventions Today    Flowsheet Row Most Recent Value  SDOH Interventions   Food Insecurity Interventions Intervention Not Indicated  Housing Interventions Intervention Not Indicated  Transportation Interventions Intervention Not Indicated  Utilities Interventions Intervention Not Indicated        Care Coordination Interventions Activated:  Yes  Care Coordination Interventions:  Yes, provided   Follow up plan: No further intervention required.   Encounter Outcome:  Pt. Visit Completed   Rodney Mina, RN Care Management Coordinator [place] Office [phone]

## 2022-02-15 NOTE — Telephone Encounter (Signed)
Sent another message to Rodney Love about this patient

## 2022-02-18 DIAGNOSIS — M47816 Spondylosis without myelopathy or radiculopathy, lumbar region: Secondary | ICD-10-CM | POA: Diagnosis not present

## 2022-02-18 DIAGNOSIS — Z87440 Personal history of urinary (tract) infections: Secondary | ICD-10-CM | POA: Diagnosis not present

## 2022-02-18 DIAGNOSIS — E785 Hyperlipidemia, unspecified: Secondary | ICD-10-CM | POA: Diagnosis not present

## 2022-02-18 DIAGNOSIS — Z9842 Cataract extraction status, left eye: Secondary | ICD-10-CM | POA: Diagnosis not present

## 2022-02-18 DIAGNOSIS — I2510 Atherosclerotic heart disease of native coronary artery without angina pectoris: Secondary | ICD-10-CM | POA: Diagnosis not present

## 2022-02-18 DIAGNOSIS — E1151 Type 2 diabetes mellitus with diabetic peripheral angiopathy without gangrene: Secondary | ICD-10-CM | POA: Diagnosis not present

## 2022-02-18 DIAGNOSIS — I70201 Unspecified atherosclerosis of native arteries of extremities, right leg: Secondary | ICD-10-CM | POA: Diagnosis not present

## 2022-02-18 DIAGNOSIS — R627 Adult failure to thrive: Secondary | ICD-10-CM | POA: Diagnosis not present

## 2022-02-18 DIAGNOSIS — Z466 Encounter for fitting and adjustment of urinary device: Secondary | ICD-10-CM | POA: Diagnosis not present

## 2022-02-18 DIAGNOSIS — Z95820 Peripheral vascular angioplasty status with implants and grafts: Secondary | ICD-10-CM | POA: Diagnosis not present

## 2022-02-18 DIAGNOSIS — G20A1 Parkinson's disease without dyskinesia, without mention of fluctuations: Secondary | ICD-10-CM | POA: Diagnosis not present

## 2022-02-18 DIAGNOSIS — Z96641 Presence of right artificial hip joint: Secondary | ICD-10-CM | POA: Diagnosis not present

## 2022-02-18 DIAGNOSIS — R1312 Dysphagia, oropharyngeal phase: Secondary | ICD-10-CM | POA: Diagnosis not present

## 2022-02-18 DIAGNOSIS — Z7401 Bed confinement status: Secondary | ICD-10-CM | POA: Diagnosis not present

## 2022-02-18 DIAGNOSIS — R339 Retention of urine, unspecified: Secondary | ICD-10-CM | POA: Diagnosis not present

## 2022-02-18 DIAGNOSIS — Z7982 Long term (current) use of aspirin: Secondary | ICD-10-CM | POA: Diagnosis not present

## 2022-02-18 DIAGNOSIS — E43 Unspecified severe protein-calorie malnutrition: Secondary | ICD-10-CM | POA: Diagnosis not present

## 2022-02-18 DIAGNOSIS — D509 Iron deficiency anemia, unspecified: Secondary | ICD-10-CM | POA: Diagnosis not present

## 2022-02-18 DIAGNOSIS — L89152 Pressure ulcer of sacral region, stage 2: Secondary | ICD-10-CM | POA: Diagnosis not present

## 2022-02-18 DIAGNOSIS — K5900 Constipation, unspecified: Secondary | ICD-10-CM | POA: Diagnosis not present

## 2022-02-18 DIAGNOSIS — I700 Atherosclerosis of aorta: Secondary | ICD-10-CM | POA: Diagnosis not present

## 2022-02-18 DIAGNOSIS — E039 Hypothyroidism, unspecified: Secondary | ICD-10-CM | POA: Diagnosis not present

## 2022-02-18 DIAGNOSIS — I10 Essential (primary) hypertension: Secondary | ICD-10-CM | POA: Diagnosis not present

## 2022-02-18 DIAGNOSIS — E876 Hypokalemia: Secondary | ICD-10-CM | POA: Diagnosis not present

## 2022-02-20 DIAGNOSIS — M47816 Spondylosis without myelopathy or radiculopathy, lumbar region: Secondary | ICD-10-CM | POA: Diagnosis not present

## 2022-02-20 DIAGNOSIS — Z9842 Cataract extraction status, left eye: Secondary | ICD-10-CM | POA: Diagnosis not present

## 2022-02-20 DIAGNOSIS — I10 Essential (primary) hypertension: Secondary | ICD-10-CM | POA: Diagnosis not present

## 2022-02-20 DIAGNOSIS — I2510 Atherosclerotic heart disease of native coronary artery without angina pectoris: Secondary | ICD-10-CM | POA: Diagnosis not present

## 2022-02-20 DIAGNOSIS — I700 Atherosclerosis of aorta: Secondary | ICD-10-CM | POA: Diagnosis not present

## 2022-02-20 DIAGNOSIS — E876 Hypokalemia: Secondary | ICD-10-CM | POA: Diagnosis not present

## 2022-02-20 DIAGNOSIS — I70201 Unspecified atherosclerosis of native arteries of extremities, right leg: Secondary | ICD-10-CM | POA: Diagnosis not present

## 2022-02-20 DIAGNOSIS — E039 Hypothyroidism, unspecified: Secondary | ICD-10-CM | POA: Diagnosis not present

## 2022-02-20 DIAGNOSIS — G20A1 Parkinson's disease without dyskinesia, without mention of fluctuations: Secondary | ICD-10-CM | POA: Diagnosis not present

## 2022-02-20 DIAGNOSIS — R1312 Dysphagia, oropharyngeal phase: Secondary | ICD-10-CM | POA: Diagnosis not present

## 2022-02-20 DIAGNOSIS — Z7982 Long term (current) use of aspirin: Secondary | ICD-10-CM | POA: Diagnosis not present

## 2022-02-20 DIAGNOSIS — Z96641 Presence of right artificial hip joint: Secondary | ICD-10-CM | POA: Diagnosis not present

## 2022-02-20 DIAGNOSIS — D509 Iron deficiency anemia, unspecified: Secondary | ICD-10-CM | POA: Diagnosis not present

## 2022-02-20 DIAGNOSIS — E785 Hyperlipidemia, unspecified: Secondary | ICD-10-CM | POA: Diagnosis not present

## 2022-02-20 DIAGNOSIS — R339 Retention of urine, unspecified: Secondary | ICD-10-CM | POA: Diagnosis not present

## 2022-02-20 DIAGNOSIS — Z87440 Personal history of urinary (tract) infections: Secondary | ICD-10-CM | POA: Diagnosis not present

## 2022-02-20 DIAGNOSIS — Z7401 Bed confinement status: Secondary | ICD-10-CM | POA: Diagnosis not present

## 2022-02-20 DIAGNOSIS — L89152 Pressure ulcer of sacral region, stage 2: Secondary | ICD-10-CM | POA: Diagnosis not present

## 2022-02-20 DIAGNOSIS — E43 Unspecified severe protein-calorie malnutrition: Secondary | ICD-10-CM | POA: Diagnosis not present

## 2022-02-20 DIAGNOSIS — Z466 Encounter for fitting and adjustment of urinary device: Secondary | ICD-10-CM | POA: Diagnosis not present

## 2022-02-20 DIAGNOSIS — R627 Adult failure to thrive: Secondary | ICD-10-CM | POA: Diagnosis not present

## 2022-02-20 DIAGNOSIS — K5900 Constipation, unspecified: Secondary | ICD-10-CM | POA: Diagnosis not present

## 2022-02-20 DIAGNOSIS — Z95820 Peripheral vascular angioplasty status with implants and grafts: Secondary | ICD-10-CM | POA: Diagnosis not present

## 2022-02-20 DIAGNOSIS — E1151 Type 2 diabetes mellitus with diabetic peripheral angiopathy without gangrene: Secondary | ICD-10-CM | POA: Diagnosis not present

## 2022-02-22 DIAGNOSIS — I700 Atherosclerosis of aorta: Secondary | ICD-10-CM | POA: Diagnosis not present

## 2022-02-22 DIAGNOSIS — Z95820 Peripheral vascular angioplasty status with implants and grafts: Secondary | ICD-10-CM | POA: Diagnosis not present

## 2022-02-22 DIAGNOSIS — Z466 Encounter for fitting and adjustment of urinary device: Secondary | ICD-10-CM | POA: Diagnosis not present

## 2022-02-22 DIAGNOSIS — Z7982 Long term (current) use of aspirin: Secondary | ICD-10-CM | POA: Diagnosis not present

## 2022-02-22 DIAGNOSIS — E1151 Type 2 diabetes mellitus with diabetic peripheral angiopathy without gangrene: Secondary | ICD-10-CM | POA: Diagnosis not present

## 2022-02-22 DIAGNOSIS — I10 Essential (primary) hypertension: Secondary | ICD-10-CM | POA: Diagnosis not present

## 2022-02-22 DIAGNOSIS — I2510 Atherosclerotic heart disease of native coronary artery without angina pectoris: Secondary | ICD-10-CM | POA: Diagnosis not present

## 2022-02-22 DIAGNOSIS — Z7401 Bed confinement status: Secondary | ICD-10-CM | POA: Diagnosis not present

## 2022-02-22 DIAGNOSIS — E785 Hyperlipidemia, unspecified: Secondary | ICD-10-CM | POA: Diagnosis not present

## 2022-02-22 DIAGNOSIS — R339 Retention of urine, unspecified: Secondary | ICD-10-CM | POA: Diagnosis not present

## 2022-02-22 DIAGNOSIS — Z96641 Presence of right artificial hip joint: Secondary | ICD-10-CM | POA: Diagnosis not present

## 2022-02-22 DIAGNOSIS — D509 Iron deficiency anemia, unspecified: Secondary | ICD-10-CM | POA: Diagnosis not present

## 2022-02-22 DIAGNOSIS — L89152 Pressure ulcer of sacral region, stage 2: Secondary | ICD-10-CM | POA: Diagnosis not present

## 2022-02-22 DIAGNOSIS — K5900 Constipation, unspecified: Secondary | ICD-10-CM | POA: Diagnosis not present

## 2022-02-22 DIAGNOSIS — Z9842 Cataract extraction status, left eye: Secondary | ICD-10-CM | POA: Diagnosis not present

## 2022-02-22 DIAGNOSIS — E43 Unspecified severe protein-calorie malnutrition: Secondary | ICD-10-CM | POA: Diagnosis not present

## 2022-02-22 DIAGNOSIS — R627 Adult failure to thrive: Secondary | ICD-10-CM | POA: Diagnosis not present

## 2022-02-22 DIAGNOSIS — E876 Hypokalemia: Secondary | ICD-10-CM | POA: Diagnosis not present

## 2022-02-22 DIAGNOSIS — G20A1 Parkinson's disease without dyskinesia, without mention of fluctuations: Secondary | ICD-10-CM | POA: Diagnosis not present

## 2022-02-22 DIAGNOSIS — I70201 Unspecified atherosclerosis of native arteries of extremities, right leg: Secondary | ICD-10-CM | POA: Diagnosis not present

## 2022-02-22 DIAGNOSIS — Z87440 Personal history of urinary (tract) infections: Secondary | ICD-10-CM | POA: Diagnosis not present

## 2022-02-22 DIAGNOSIS — E039 Hypothyroidism, unspecified: Secondary | ICD-10-CM | POA: Diagnosis not present

## 2022-02-22 DIAGNOSIS — R1312 Dysphagia, oropharyngeal phase: Secondary | ICD-10-CM | POA: Diagnosis not present

## 2022-02-22 DIAGNOSIS — M47816 Spondylosis without myelopathy or radiculopathy, lumbar region: Secondary | ICD-10-CM | POA: Diagnosis not present

## 2022-02-24 DIAGNOSIS — I70201 Unspecified atherosclerosis of native arteries of extremities, right leg: Secondary | ICD-10-CM | POA: Diagnosis not present

## 2022-02-24 DIAGNOSIS — I10 Essential (primary) hypertension: Secondary | ICD-10-CM | POA: Diagnosis not present

## 2022-02-24 DIAGNOSIS — D509 Iron deficiency anemia, unspecified: Secondary | ICD-10-CM | POA: Diagnosis not present

## 2022-02-24 DIAGNOSIS — Z466 Encounter for fitting and adjustment of urinary device: Secondary | ICD-10-CM | POA: Diagnosis not present

## 2022-02-24 DIAGNOSIS — E1151 Type 2 diabetes mellitus with diabetic peripheral angiopathy without gangrene: Secondary | ICD-10-CM | POA: Diagnosis not present

## 2022-02-24 DIAGNOSIS — R339 Retention of urine, unspecified: Secondary | ICD-10-CM | POA: Diagnosis not present

## 2022-02-24 DIAGNOSIS — I700 Atherosclerosis of aorta: Secondary | ICD-10-CM | POA: Diagnosis not present

## 2022-02-24 DIAGNOSIS — I2510 Atherosclerotic heart disease of native coronary artery without angina pectoris: Secondary | ICD-10-CM | POA: Diagnosis not present

## 2022-02-24 DIAGNOSIS — L89152 Pressure ulcer of sacral region, stage 2: Secondary | ICD-10-CM | POA: Diagnosis not present

## 2022-02-24 DIAGNOSIS — M47816 Spondylosis without myelopathy or radiculopathy, lumbar region: Secondary | ICD-10-CM | POA: Diagnosis not present

## 2022-02-24 DIAGNOSIS — G20A1 Parkinson's disease without dyskinesia, without mention of fluctuations: Secondary | ICD-10-CM | POA: Diagnosis not present

## 2022-02-25 ENCOUNTER — Other Ambulatory Visit: Payer: Self-pay

## 2022-02-25 DIAGNOSIS — G20B1 Parkinson's disease with dyskinesia, without mention of fluctuations: Secondary | ICD-10-CM

## 2022-02-25 DIAGNOSIS — F02818 Dementia in other diseases classified elsewhere, unspecified severity, with other behavioral disturbance: Secondary | ICD-10-CM

## 2022-02-25 LAB — CUP PACEART REMOTE DEVICE CHECK
Date Time Interrogation Session: 20231126230615
Implantable Pulse Generator Implant Date: 20210830

## 2022-02-25 MED ORDER — AMBULATORY NON FORMULARY MEDICATION: 0 refills | Status: DC

## 2022-02-27 DIAGNOSIS — E785 Hyperlipidemia, unspecified: Secondary | ICD-10-CM | POA: Diagnosis not present

## 2022-02-27 DIAGNOSIS — Z96641 Presence of right artificial hip joint: Secondary | ICD-10-CM | POA: Diagnosis not present

## 2022-02-27 DIAGNOSIS — I70201 Unspecified atherosclerosis of native arteries of extremities, right leg: Secondary | ICD-10-CM | POA: Diagnosis not present

## 2022-02-27 DIAGNOSIS — R339 Retention of urine, unspecified: Secondary | ICD-10-CM | POA: Diagnosis not present

## 2022-02-27 DIAGNOSIS — E1151 Type 2 diabetes mellitus with diabetic peripheral angiopathy without gangrene: Secondary | ICD-10-CM | POA: Diagnosis not present

## 2022-02-27 DIAGNOSIS — E039 Hypothyroidism, unspecified: Secondary | ICD-10-CM | POA: Diagnosis not present

## 2022-02-27 DIAGNOSIS — G20A1 Parkinson's disease without dyskinesia, without mention of fluctuations: Secondary | ICD-10-CM | POA: Diagnosis not present

## 2022-02-27 DIAGNOSIS — I2510 Atherosclerotic heart disease of native coronary artery without angina pectoris: Secondary | ICD-10-CM | POA: Diagnosis not present

## 2022-02-27 DIAGNOSIS — M47816 Spondylosis without myelopathy or radiculopathy, lumbar region: Secondary | ICD-10-CM | POA: Diagnosis not present

## 2022-02-27 DIAGNOSIS — I10 Essential (primary) hypertension: Secondary | ICD-10-CM | POA: Diagnosis not present

## 2022-02-27 DIAGNOSIS — K5900 Constipation, unspecified: Secondary | ICD-10-CM | POA: Diagnosis not present

## 2022-02-27 DIAGNOSIS — Z9842 Cataract extraction status, left eye: Secondary | ICD-10-CM | POA: Diagnosis not present

## 2022-02-27 DIAGNOSIS — R8290 Unspecified abnormal findings in urine: Secondary | ICD-10-CM | POA: Diagnosis not present

## 2022-02-27 DIAGNOSIS — Z95820 Peripheral vascular angioplasty status with implants and grafts: Secondary | ICD-10-CM | POA: Diagnosis not present

## 2022-02-27 DIAGNOSIS — L89152 Pressure ulcer of sacral region, stage 2: Secondary | ICD-10-CM | POA: Diagnosis not present

## 2022-02-27 DIAGNOSIS — Z7401 Bed confinement status: Secondary | ICD-10-CM | POA: Diagnosis not present

## 2022-02-27 DIAGNOSIS — E43 Unspecified severe protein-calorie malnutrition: Secondary | ICD-10-CM | POA: Diagnosis not present

## 2022-02-27 DIAGNOSIS — D509 Iron deficiency anemia, unspecified: Secondary | ICD-10-CM | POA: Diagnosis not present

## 2022-02-27 DIAGNOSIS — Z466 Encounter for fitting and adjustment of urinary device: Secondary | ICD-10-CM | POA: Diagnosis not present

## 2022-02-27 DIAGNOSIS — E876 Hypokalemia: Secondary | ICD-10-CM | POA: Diagnosis not present

## 2022-02-27 DIAGNOSIS — R1312 Dysphagia, oropharyngeal phase: Secondary | ICD-10-CM | POA: Diagnosis not present

## 2022-02-27 DIAGNOSIS — I700 Atherosclerosis of aorta: Secondary | ICD-10-CM | POA: Diagnosis not present

## 2022-02-27 DIAGNOSIS — R627 Adult failure to thrive: Secondary | ICD-10-CM | POA: Diagnosis not present

## 2022-02-27 DIAGNOSIS — Z7982 Long term (current) use of aspirin: Secondary | ICD-10-CM | POA: Diagnosis not present

## 2022-02-28 DIAGNOSIS — Z7401 Bed confinement status: Secondary | ICD-10-CM | POA: Diagnosis not present

## 2022-02-28 DIAGNOSIS — I10 Essential (primary) hypertension: Secondary | ICD-10-CM | POA: Diagnosis not present

## 2022-02-28 DIAGNOSIS — E039 Hypothyroidism, unspecified: Secondary | ICD-10-CM | POA: Diagnosis not present

## 2022-02-28 DIAGNOSIS — M47816 Spondylosis without myelopathy or radiculopathy, lumbar region: Secondary | ICD-10-CM | POA: Diagnosis not present

## 2022-02-28 DIAGNOSIS — G20A1 Parkinson's disease without dyskinesia, without mention of fluctuations: Secondary | ICD-10-CM | POA: Diagnosis not present

## 2022-02-28 DIAGNOSIS — Z96641 Presence of right artificial hip joint: Secondary | ICD-10-CM | POA: Diagnosis not present

## 2022-02-28 DIAGNOSIS — R339 Retention of urine, unspecified: Secondary | ICD-10-CM | POA: Diagnosis not present

## 2022-02-28 DIAGNOSIS — I2510 Atherosclerotic heart disease of native coronary artery without angina pectoris: Secondary | ICD-10-CM | POA: Diagnosis not present

## 2022-02-28 DIAGNOSIS — Z7982 Long term (current) use of aspirin: Secondary | ICD-10-CM | POA: Diagnosis not present

## 2022-02-28 DIAGNOSIS — Z466 Encounter for fitting and adjustment of urinary device: Secondary | ICD-10-CM | POA: Diagnosis not present

## 2022-02-28 DIAGNOSIS — E43 Unspecified severe protein-calorie malnutrition: Secondary | ICD-10-CM | POA: Diagnosis not present

## 2022-02-28 DIAGNOSIS — Z95820 Peripheral vascular angioplasty status with implants and grafts: Secondary | ICD-10-CM | POA: Diagnosis not present

## 2022-02-28 DIAGNOSIS — R1312 Dysphagia, oropharyngeal phase: Secondary | ICD-10-CM | POA: Diagnosis not present

## 2022-02-28 DIAGNOSIS — E785 Hyperlipidemia, unspecified: Secondary | ICD-10-CM | POA: Diagnosis not present

## 2022-02-28 DIAGNOSIS — Z9842 Cataract extraction status, left eye: Secondary | ICD-10-CM | POA: Diagnosis not present

## 2022-02-28 DIAGNOSIS — K5900 Constipation, unspecified: Secondary | ICD-10-CM | POA: Diagnosis not present

## 2022-02-28 DIAGNOSIS — E876 Hypokalemia: Secondary | ICD-10-CM | POA: Diagnosis not present

## 2022-02-28 DIAGNOSIS — E1151 Type 2 diabetes mellitus with diabetic peripheral angiopathy without gangrene: Secondary | ICD-10-CM | POA: Diagnosis not present

## 2022-02-28 DIAGNOSIS — L89152 Pressure ulcer of sacral region, stage 2: Secondary | ICD-10-CM | POA: Diagnosis not present

## 2022-02-28 DIAGNOSIS — I700 Atherosclerosis of aorta: Secondary | ICD-10-CM | POA: Diagnosis not present

## 2022-02-28 DIAGNOSIS — R627 Adult failure to thrive: Secondary | ICD-10-CM | POA: Diagnosis not present

## 2022-02-28 DIAGNOSIS — D509 Iron deficiency anemia, unspecified: Secondary | ICD-10-CM | POA: Diagnosis not present

## 2022-02-28 DIAGNOSIS — I70201 Unspecified atherosclerosis of native arteries of extremities, right leg: Secondary | ICD-10-CM | POA: Diagnosis not present

## 2022-03-01 DIAGNOSIS — I2510 Atherosclerotic heart disease of native coronary artery without angina pectoris: Secondary | ICD-10-CM | POA: Diagnosis not present

## 2022-03-01 DIAGNOSIS — E039 Hypothyroidism, unspecified: Secondary | ICD-10-CM | POA: Diagnosis not present

## 2022-03-01 DIAGNOSIS — E43 Unspecified severe protein-calorie malnutrition: Secondary | ICD-10-CM | POA: Diagnosis not present

## 2022-03-01 DIAGNOSIS — Z9842 Cataract extraction status, left eye: Secondary | ICD-10-CM | POA: Diagnosis not present

## 2022-03-01 DIAGNOSIS — R627 Adult failure to thrive: Secondary | ICD-10-CM | POA: Diagnosis not present

## 2022-03-01 DIAGNOSIS — D509 Iron deficiency anemia, unspecified: Secondary | ICD-10-CM | POA: Diagnosis not present

## 2022-03-01 DIAGNOSIS — Z466 Encounter for fitting and adjustment of urinary device: Secondary | ICD-10-CM | POA: Diagnosis not present

## 2022-03-01 DIAGNOSIS — Z95820 Peripheral vascular angioplasty status with implants and grafts: Secondary | ICD-10-CM | POA: Diagnosis not present

## 2022-03-01 DIAGNOSIS — E785 Hyperlipidemia, unspecified: Secondary | ICD-10-CM | POA: Diagnosis not present

## 2022-03-01 DIAGNOSIS — I700 Atherosclerosis of aorta: Secondary | ICD-10-CM | POA: Diagnosis not present

## 2022-03-01 DIAGNOSIS — K5900 Constipation, unspecified: Secondary | ICD-10-CM | POA: Diagnosis not present

## 2022-03-01 DIAGNOSIS — E876 Hypokalemia: Secondary | ICD-10-CM | POA: Diagnosis not present

## 2022-03-01 DIAGNOSIS — M47816 Spondylosis without myelopathy or radiculopathy, lumbar region: Secondary | ICD-10-CM | POA: Diagnosis not present

## 2022-03-01 DIAGNOSIS — I70201 Unspecified atherosclerosis of native arteries of extremities, right leg: Secondary | ICD-10-CM | POA: Diagnosis not present

## 2022-03-01 DIAGNOSIS — L89152 Pressure ulcer of sacral region, stage 2: Secondary | ICD-10-CM | POA: Diagnosis not present

## 2022-03-01 DIAGNOSIS — E1151 Type 2 diabetes mellitus with diabetic peripheral angiopathy without gangrene: Secondary | ICD-10-CM | POA: Diagnosis not present

## 2022-03-01 DIAGNOSIS — R1312 Dysphagia, oropharyngeal phase: Secondary | ICD-10-CM | POA: Diagnosis not present

## 2022-03-01 DIAGNOSIS — G20A1 Parkinson's disease without dyskinesia, without mention of fluctuations: Secondary | ICD-10-CM | POA: Diagnosis not present

## 2022-03-01 DIAGNOSIS — Z7982 Long term (current) use of aspirin: Secondary | ICD-10-CM | POA: Diagnosis not present

## 2022-03-01 DIAGNOSIS — Z96641 Presence of right artificial hip joint: Secondary | ICD-10-CM | POA: Diagnosis not present

## 2022-03-01 DIAGNOSIS — I10 Essential (primary) hypertension: Secondary | ICD-10-CM | POA: Diagnosis not present

## 2022-03-01 DIAGNOSIS — Z7401 Bed confinement status: Secondary | ICD-10-CM | POA: Diagnosis not present

## 2022-03-01 DIAGNOSIS — R339 Retention of urine, unspecified: Secondary | ICD-10-CM | POA: Diagnosis not present

## 2022-03-04 ENCOUNTER — Telehealth: Payer: Self-pay

## 2022-03-04 ENCOUNTER — Other Ambulatory Visit: Payer: Self-pay

## 2022-03-04 DIAGNOSIS — G20A1 Parkinson's disease without dyskinesia, without mention of fluctuations: Secondary | ICD-10-CM

## 2022-03-04 NOTE — Telephone Encounter (Signed)
[facility] calling this morning and [name] and son in law over the weekend requesting Hospice care. They are moving patient to live with them in [facility] and they want Hospice for him. Patient had a referral to hospice 6 months ago and in our system they denied hospice per wife and daughter [name]. I told representative tat if Dr. Carles Collet gave orders she would not be attending and we did not want to get involved in the family drama and issue over this and that perhaps the patients PCP would be best for that order but I would ask Dr. Carles Collet what she would want to do for this patient and let [facility] know

## 2022-03-04 NOTE — Telephone Encounter (Signed)
ORDER SENT

## 2022-03-06 DIAGNOSIS — E43 Unspecified severe protein-calorie malnutrition: Secondary | ICD-10-CM | POA: Diagnosis not present

## 2022-03-06 DIAGNOSIS — Z7401 Bed confinement status: Secondary | ICD-10-CM | POA: Diagnosis not present

## 2022-03-06 DIAGNOSIS — Z96641 Presence of right artificial hip joint: Secondary | ICD-10-CM | POA: Diagnosis not present

## 2022-03-06 DIAGNOSIS — E876 Hypokalemia: Secondary | ICD-10-CM | POA: Diagnosis not present

## 2022-03-06 DIAGNOSIS — R627 Adult failure to thrive: Secondary | ICD-10-CM | POA: Diagnosis not present

## 2022-03-06 DIAGNOSIS — Z9842 Cataract extraction status, left eye: Secondary | ICD-10-CM | POA: Diagnosis not present

## 2022-03-06 DIAGNOSIS — D509 Iron deficiency anemia, unspecified: Secondary | ICD-10-CM | POA: Diagnosis not present

## 2022-03-06 DIAGNOSIS — I700 Atherosclerosis of aorta: Secondary | ICD-10-CM | POA: Diagnosis not present

## 2022-03-06 DIAGNOSIS — E039 Hypothyroidism, unspecified: Secondary | ICD-10-CM | POA: Diagnosis not present

## 2022-03-06 DIAGNOSIS — I2510 Atherosclerotic heart disease of native coronary artery without angina pectoris: Secondary | ICD-10-CM | POA: Diagnosis not present

## 2022-03-06 DIAGNOSIS — E1151 Type 2 diabetes mellitus with diabetic peripheral angiopathy without gangrene: Secondary | ICD-10-CM | POA: Diagnosis not present

## 2022-03-06 DIAGNOSIS — M47816 Spondylosis without myelopathy or radiculopathy, lumbar region: Secondary | ICD-10-CM | POA: Diagnosis not present

## 2022-03-06 DIAGNOSIS — Z466 Encounter for fitting and adjustment of urinary device: Secondary | ICD-10-CM | POA: Diagnosis not present

## 2022-03-06 DIAGNOSIS — G20A1 Parkinson's disease without dyskinesia, without mention of fluctuations: Secondary | ICD-10-CM | POA: Diagnosis not present

## 2022-03-06 DIAGNOSIS — Z7982 Long term (current) use of aspirin: Secondary | ICD-10-CM | POA: Diagnosis not present

## 2022-03-06 DIAGNOSIS — I10 Essential (primary) hypertension: Secondary | ICD-10-CM | POA: Diagnosis not present

## 2022-03-06 DIAGNOSIS — G20C Parkinsonism, unspecified: Secondary | ICD-10-CM | POA: Diagnosis not present

## 2022-03-06 DIAGNOSIS — Z95820 Peripheral vascular angioplasty status with implants and grafts: Secondary | ICD-10-CM | POA: Diagnosis not present

## 2022-03-06 DIAGNOSIS — R1312 Dysphagia, oropharyngeal phase: Secondary | ICD-10-CM | POA: Diagnosis not present

## 2022-03-06 DIAGNOSIS — I70201 Unspecified atherosclerosis of native arteries of extremities, right leg: Secondary | ICD-10-CM | POA: Diagnosis not present

## 2022-03-06 DIAGNOSIS — K5900 Constipation, unspecified: Secondary | ICD-10-CM | POA: Diagnosis not present

## 2022-03-06 DIAGNOSIS — R339 Retention of urine, unspecified: Secondary | ICD-10-CM | POA: Diagnosis not present

## 2022-03-06 DIAGNOSIS — L89152 Pressure ulcer of sacral region, stage 2: Secondary | ICD-10-CM | POA: Diagnosis not present

## 2022-03-06 DIAGNOSIS — E785 Hyperlipidemia, unspecified: Secondary | ICD-10-CM | POA: Diagnosis not present

## 2022-03-07 DIAGNOSIS — L89154 Pressure ulcer of sacral region, stage 4: Secondary | ICD-10-CM | POA: Diagnosis not present

## 2022-03-07 DIAGNOSIS — Z9181 History of falling: Secondary | ICD-10-CM | POA: Diagnosis not present

## 2022-03-08 DIAGNOSIS — L89152 Pressure ulcer of sacral region, stage 2: Secondary | ICD-10-CM | POA: Diagnosis not present

## 2022-03-09 DIAGNOSIS — Z743 Need for continuous supervision: Secondary | ICD-10-CM | POA: Diagnosis not present

## 2022-03-09 DIAGNOSIS — R531 Weakness: Secondary | ICD-10-CM | POA: Diagnosis not present

## 2022-03-09 DIAGNOSIS — Z7401 Bed confinement status: Secondary | ICD-10-CM | POA: Diagnosis not present

## 2022-03-15 ENCOUNTER — Encounter (HOSPITAL_BASED_OUTPATIENT_CLINIC_OR_DEPARTMENT_OTHER): Payer: Medicare Other | Admitting: Internal Medicine

## 2022-03-19 ENCOUNTER — Telehealth: Payer: Self-pay | Admitting: Neurology

## 2022-03-19 NOTE — Telephone Encounter (Signed)
Patient son wanted to know if we got the FMLA paperwork for him so that he can help take care of patient and help bring him to appts   please call  It should have been faxed to our office

## 2022-03-19 NOTE — Telephone Encounter (Signed)
Called patients daughter [name] and spoke to her about the son is not on the [facility] and they will need Hospice to fill that out we have not met the son

## 2022-03-20 ENCOUNTER — Encounter: Payer: Self-pay | Admitting: Neurology

## 2022-03-20 ENCOUNTER — Other Ambulatory Visit: Payer: Self-pay | Admitting: Neurology

## 2022-03-20 NOTE — Telephone Encounter (Signed)
Called patients daughter [name] and explained we would not be able to fill out paperwork for her brother that we do not know and is not on the [facility] for this patient

## 2022-03-27 ENCOUNTER — Telehealth: Payer: Self-pay

## 2022-03-27 NOTE — Telephone Encounter (Signed)
Alert received from [employer]:  ILR alert for AF, receiving almost daily alerts for AF. Hx of Parkinson's tremor and noisy baseline with known PAF. Difficult to review true AF vs tremor. However, appears having increased true AF, see AF trend on Cardiac compass and episodes attached as well as other episodes in [employer]. Routing for updated clinical status review. Reported in 09/2021, no OAC at that time due to palliative/hospice care. If this is still plan, consider disabling AF alerts.  JJB  Loop recorder placed for syncope.  Routing to Dr. Loletha Grayer for further advisement.

## 2022-03-28 LAB — CUP PACEART REMOTE DEVICE CHECK
Date Time Interrogation Session: 20231227230541
Implantable Pulse Generator Implant Date: 20210830

## 2022-03-29 NOTE — Telephone Encounter (Signed)
AF alerts turned off.

## 2022-04-01 DEATH — deceased

## 2022-04-04 ENCOUNTER — Encounter: Payer: Self-pay | Admitting: Cardiovascular Disease

## 2022-04-04 ENCOUNTER — Encounter: Payer: Self-pay | Admitting: Neurology

## 2022-04-05 NOTE — Telephone Encounter (Signed)
Spoke with daughter and expressed my condolences and she expressed appreciation

## 2022-04-19 ENCOUNTER — Other Ambulatory Visit: Payer: Self-pay | Admitting: Neurology

## 2022-05-05 IMAGING — CR DG CHEST 2V
2 series · 2 of 2 positions shown · non-contrast
Comparison: 09/18/2012

CLINICAL DATA: Chest pain multiple fall

EXAM:
CHEST - 2 VIEW

[w chest lat]
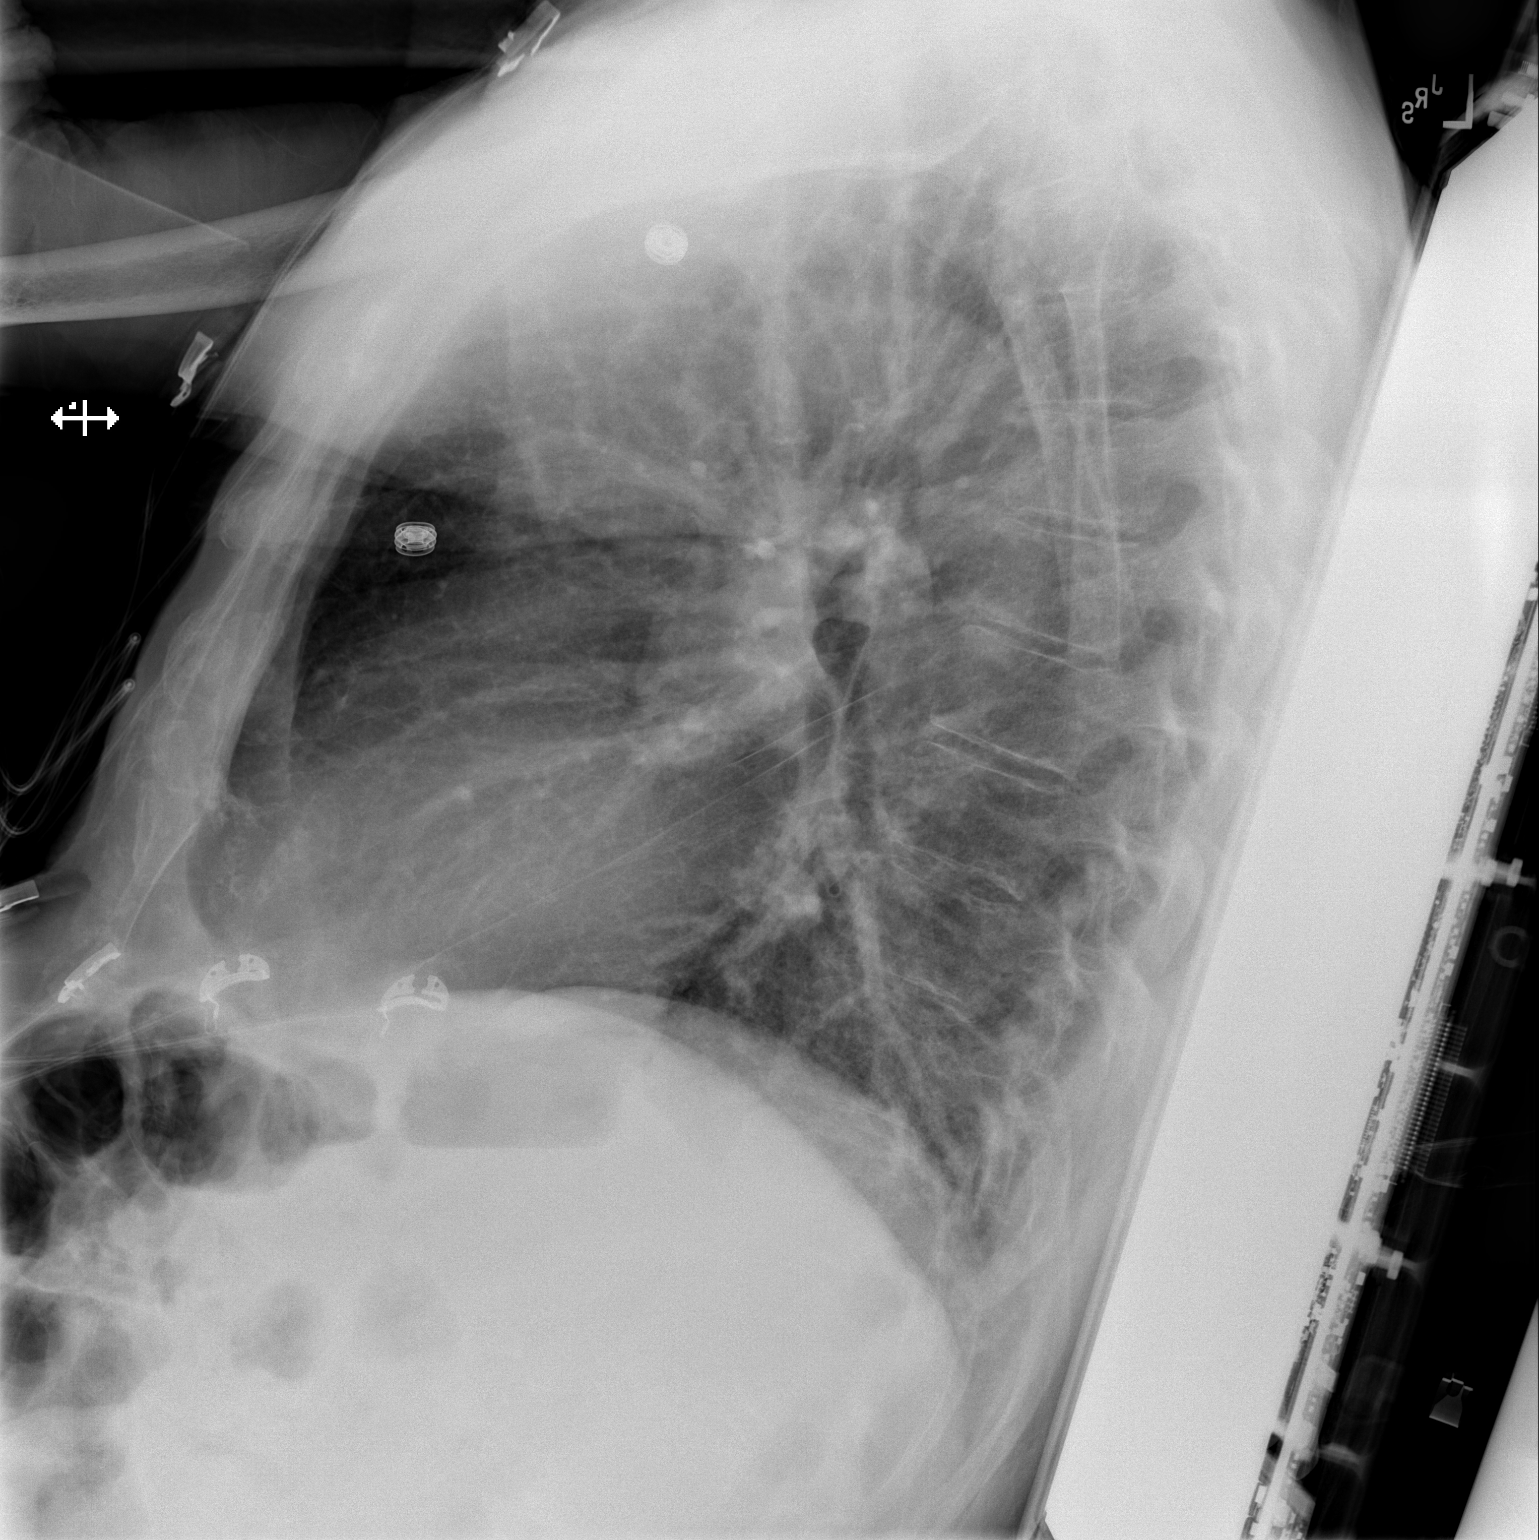

[x chest ap]
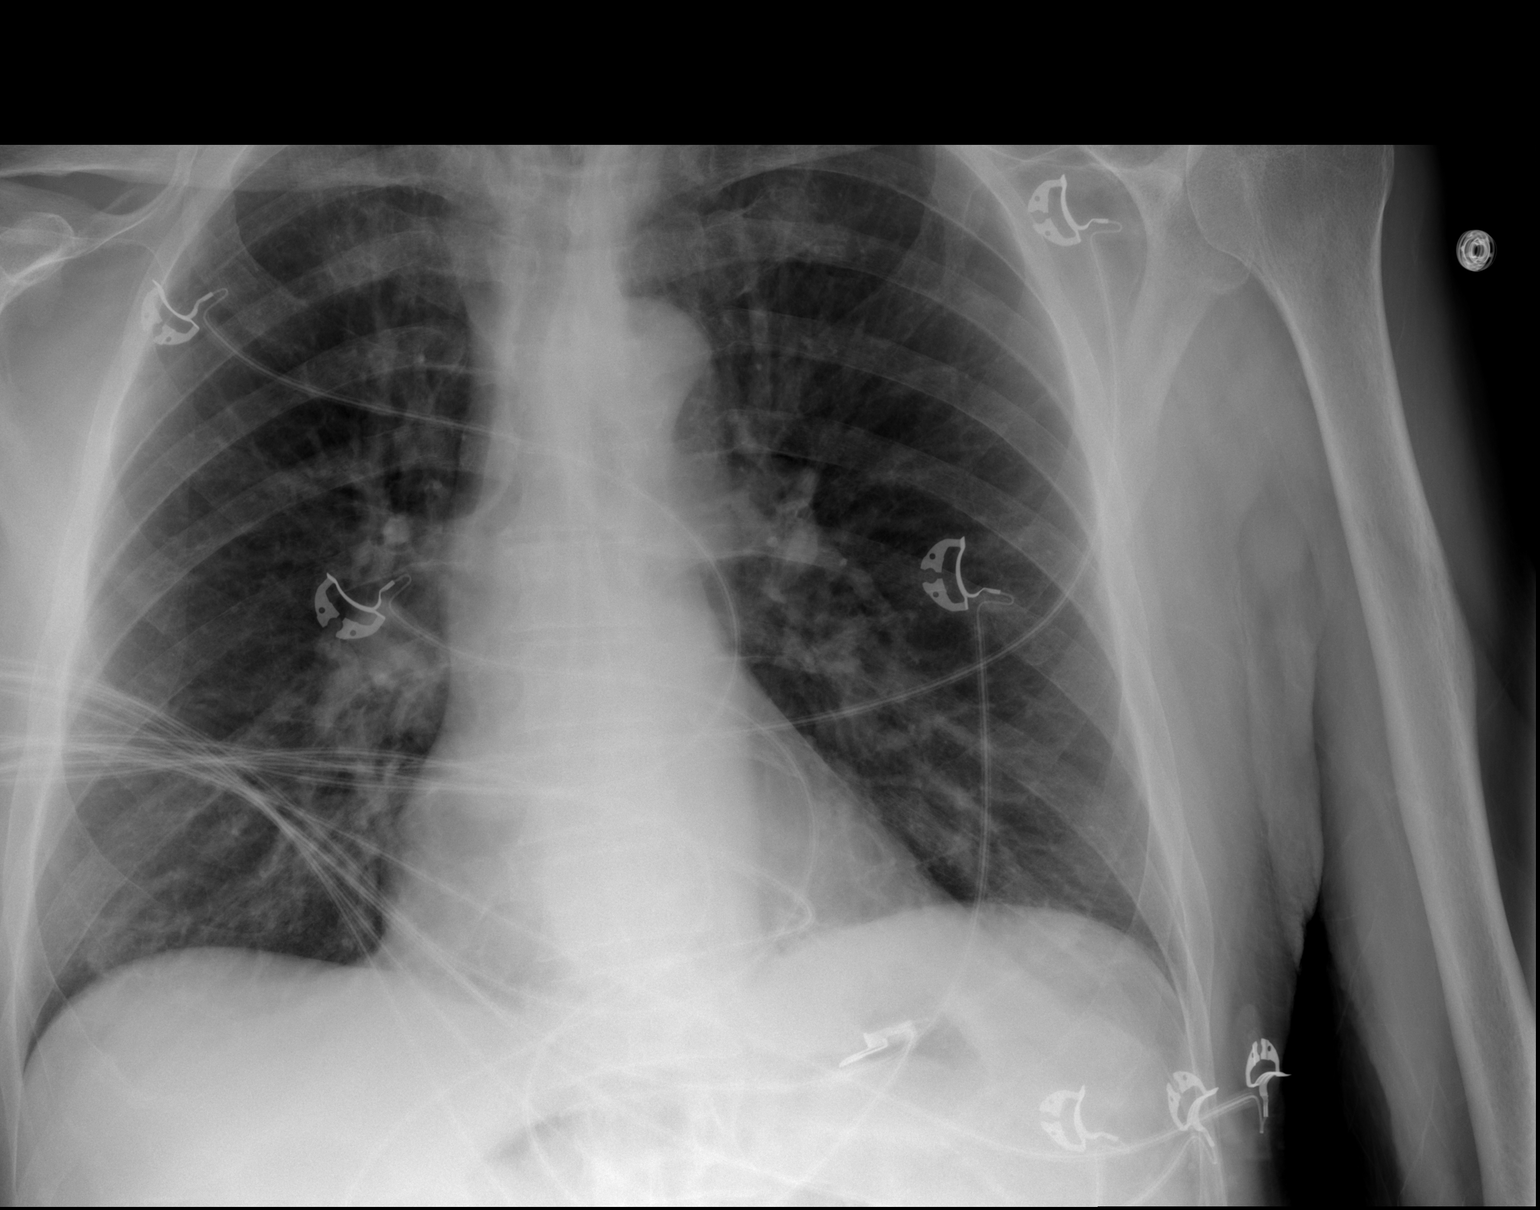

[2 of 2 positions shown; findings below may reference images not displayed]

FINDINGS: The heart size and mediastinal contours are within normal limits.
Both lungs are clear. The visualized skeletal structures are
unremarkable.
IMPRESSION: No active cardiopulmonary disease.

## 2022-10-29 ENCOUNTER — Telehealth: Payer: Medicare Other | Admitting: Neurology

## 2022-12-30 IMAGING — RF DG C-ARM 1-60 MIN-NO REPORT
1 series · 2 of 2 positions shown · non-contrast
Comparison: 04/28/2020

CLINICAL DATA: 74-year-old male with a history of right hip
arthroplasty

EXAM:
OPERATIVE RIGHT HIP (WITH PELVIS IF PERFORMED) 2 VIEWS
TECHNIQUE: Fluoroscopic spot image(s) were submitted for interpretation
post-operatively.

[Series 1: unknown protocol · 0.20mm/px · 2 of 2 slices shown]
[im 1/2]
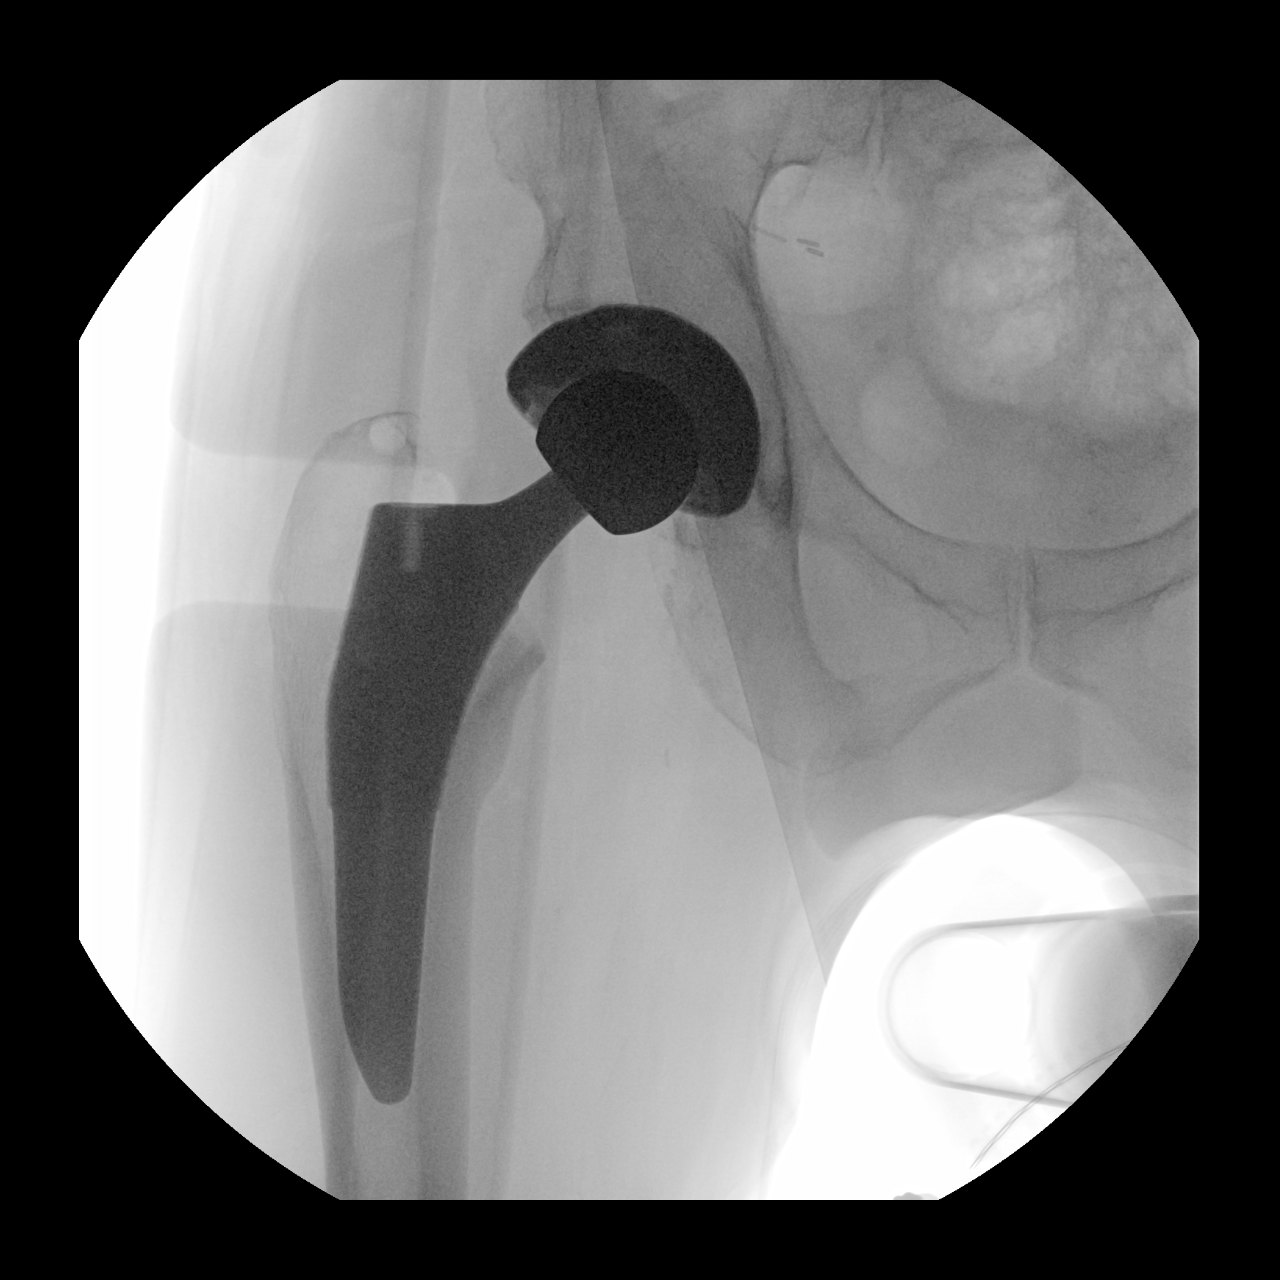
[im 2/2]
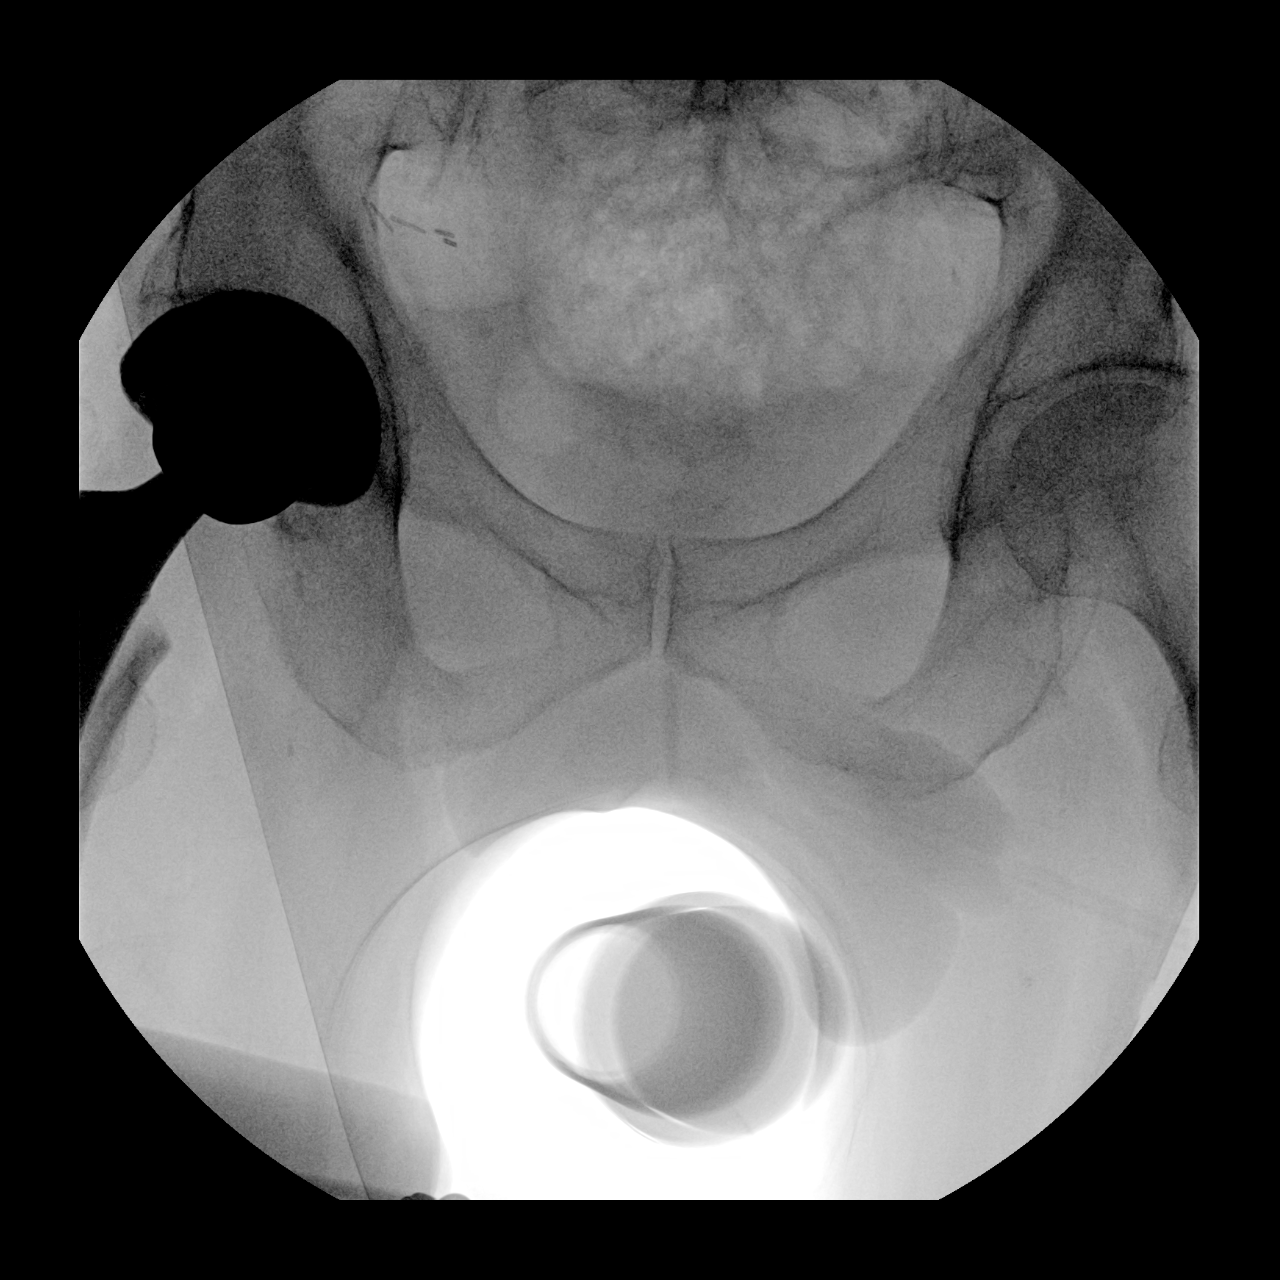

[2 of 2 positions shown; findings below may reference images not displayed]

FINDINGS: Limited intraoperative fluoroscopic spot images of the right hip
demonstrating right hip arthroplasty for fracture fixation.

Components are aligned.
IMPRESSION: Limited intraoperative fluoroscopic spot images of right hip
arthroplasty. Please refer to the dictated operative report for full
details of intraoperative findings and procedure.

## 2022-12-30 IMAGING — RF DG HIP (WITH PELVIS) OPERATIVE*R*
1 series · 2 of 2 positions shown · non-contrast
Comparison: 04/28/2020

CLINICAL DATA: 74-year-old male with a history of right hip
arthroplasty

EXAM:
OPERATIVE RIGHT HIP (WITH PELVIS IF PERFORMED) 2 VIEWS
TECHNIQUE: Fluoroscopic spot image(s) were submitted for interpretation
post-operatively.

[Series 1: unknown protocol · 0.20mm/px · 2 of 2 slices shown]
[im 1/2]
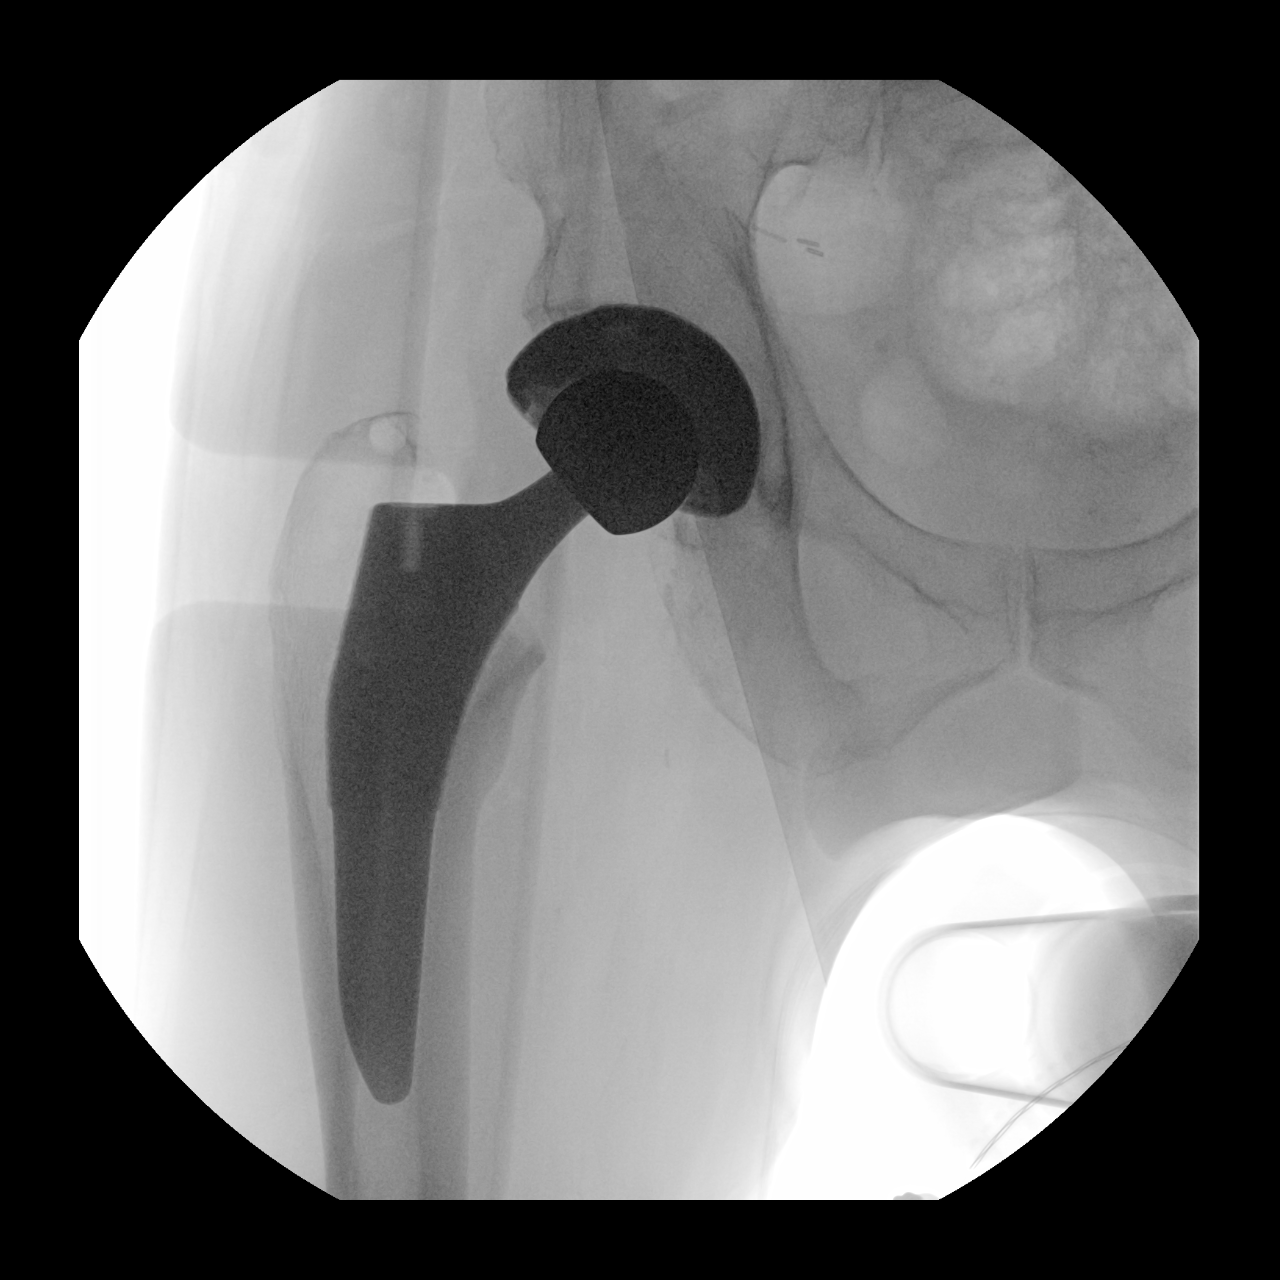
[im 2/2]
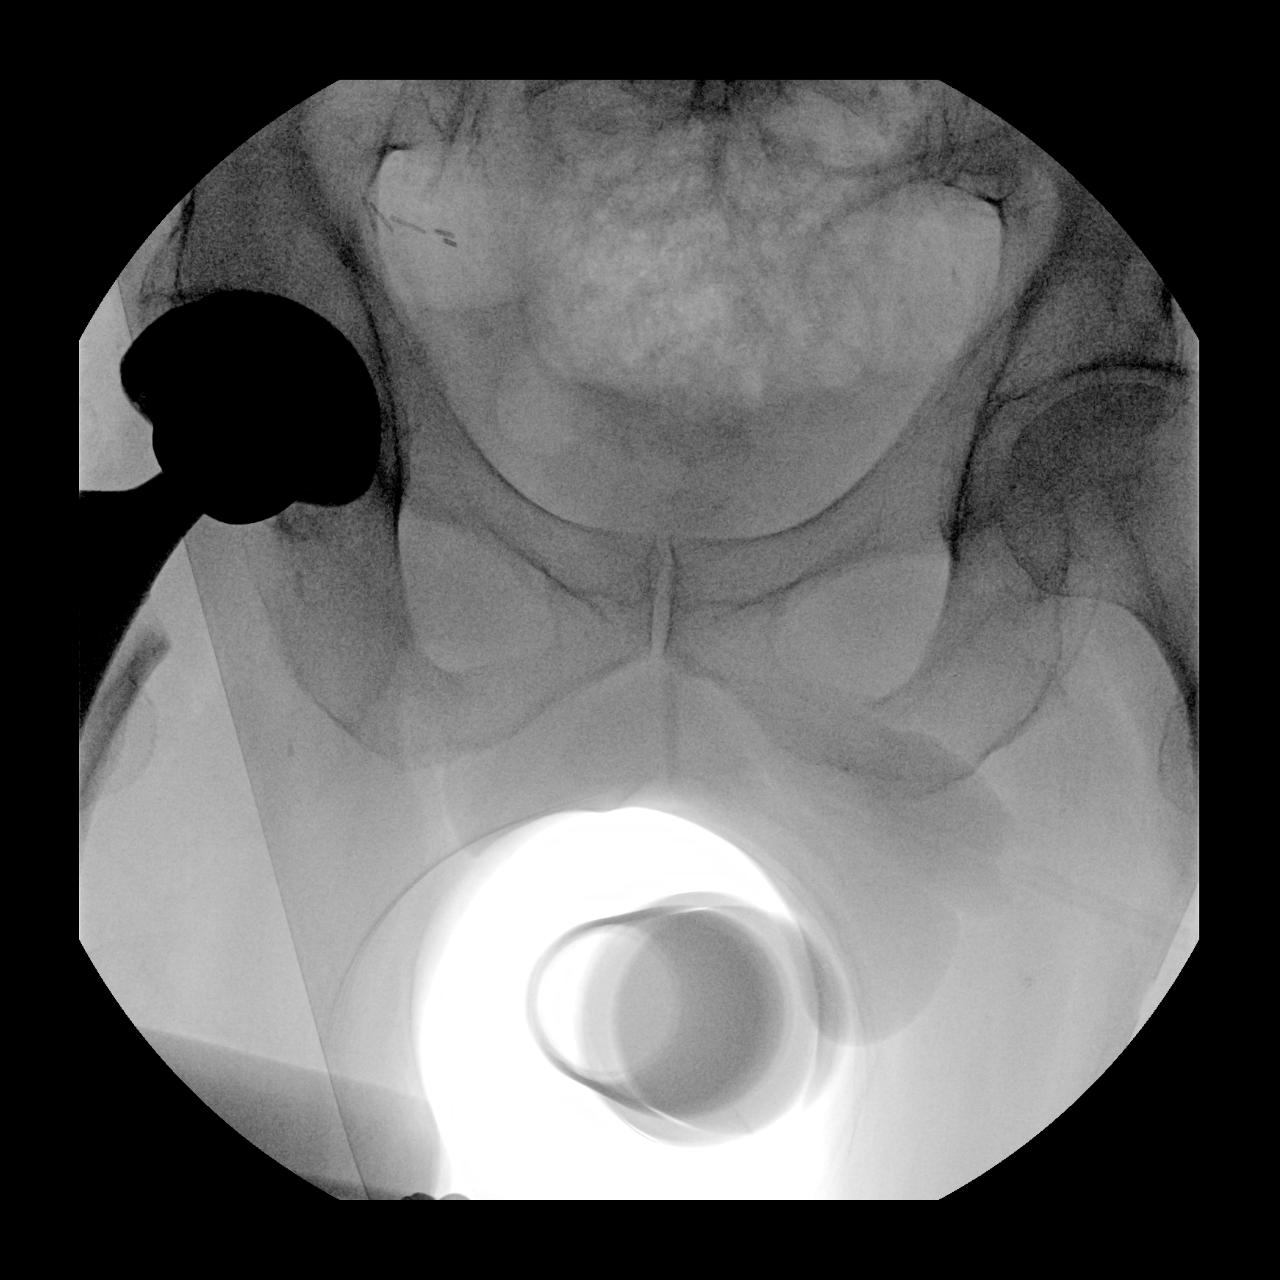

[2 of 2 positions shown; findings below may reference images not displayed]

FINDINGS: Limited intraoperative fluoroscopic spot images of the right hip
demonstrating right hip arthroplasty for fracture fixation.

Components are aligned.
IMPRESSION: Limited intraoperative fluoroscopic spot images of right hip
arthroplasty. Please refer to the dictated operative report for full
details of intraoperative findings and procedure.
